# Patient Record
Sex: Male | Born: 1960
Health system: Southern US, Community
[De-identification: ages and names within clinical notes are randomized; demographics above are authoritative.]

## PROBLEM LIST (undated history)

## (undated) DIAGNOSIS — T7840XA Allergy, unspecified, initial encounter: Secondary | ICD-10-CM

## (undated) DIAGNOSIS — I4891 Unspecified atrial fibrillation: Secondary | ICD-10-CM

## (undated) DIAGNOSIS — E7800 Pure hypercholesterolemia, unspecified: Secondary | ICD-10-CM

## (undated) DIAGNOSIS — E079 Disorder of thyroid, unspecified: Secondary | ICD-10-CM

## (undated) DIAGNOSIS — J189 Pneumonia, unspecified organism: Secondary | ICD-10-CM

## (undated) DIAGNOSIS — F419 Anxiety disorder, unspecified: Secondary | ICD-10-CM

## (undated) DIAGNOSIS — I10 Essential (primary) hypertension: Secondary | ICD-10-CM

## (undated) DIAGNOSIS — F32A Depression, unspecified: Secondary | ICD-10-CM

## (undated) DIAGNOSIS — F329 Major depressive disorder, single episode, unspecified: Secondary | ICD-10-CM

## (undated) DIAGNOSIS — G510 Bell's palsy: Secondary | ICD-10-CM

## (undated) HISTORY — DX: Essential (primary) hypertension: I10

## (undated) HISTORY — DX: Bell's palsy: G51.0

## (undated) HISTORY — PX: VASECTOMY: SHX75

## (undated) HISTORY — DX: Anxiety disorder, unspecified: F41.9

## (undated) HISTORY — PX: CHOLECYSTECTOMY: SHX55

## (undated) HISTORY — DX: Pneumonia, unspecified organism: J18.9

## (undated) HISTORY — DX: Depression, unspecified: F32.A

## (undated) HISTORY — DX: Disorder of thyroid, unspecified: E07.9

## (undated) HISTORY — DX: Allergy, unspecified, initial encounter: T78.40XA

## (undated) HISTORY — PX: HERNIA REPAIR: SHX51

## (undated) HISTORY — DX: Major depressive disorder, single episode, unspecified: F32.9

## (undated) HISTORY — PX: KNEE SURGERY: SHX244

## (undated) HISTORY — DX: Pure hypercholesterolemia, unspecified: E78.00

---

## 1999-05-14 ENCOUNTER — Emergency Department (HOSPITAL_COMMUNITY): Admission: EM | Admit: 1999-05-14 | Discharge: 1999-05-14 | Payer: Self-pay | Admitting: Emergency Medicine

## 2003-08-01 ENCOUNTER — Encounter: Admission: RE | Admit: 2003-08-01 | Discharge: 2003-08-01 | Payer: Self-pay | Admitting: Otolaryngology

## 2005-12-07 LAB — HM COLONOSCOPY

## 2007-12-26 ENCOUNTER — Emergency Department (HOSPITAL_COMMUNITY): Admission: EM | Admit: 2007-12-26 | Discharge: 2007-12-27 | Payer: Self-pay | Admitting: Emergency Medicine

## 2008-01-01 ENCOUNTER — Inpatient Hospital Stay (HOSPITAL_COMMUNITY): Admission: EM | Admit: 2008-01-01 | Discharge: 2008-01-03 | Payer: Self-pay | Admitting: Emergency Medicine

## 2008-01-02 ENCOUNTER — Encounter (INDEPENDENT_AMBULATORY_CARE_PROVIDER_SITE_OTHER): Payer: Self-pay | Admitting: Surgery

## 2009-02-12 LAB — HM COLONOSCOPY

## 2010-08-21 ENCOUNTER — Emergency Department (HOSPITAL_COMMUNITY): Admission: EM | Admit: 2010-08-21 | Discharge: 2010-04-06 | Payer: Self-pay | Admitting: Emergency Medicine

## 2010-11-18 ENCOUNTER — Inpatient Hospital Stay (HOSPITAL_COMMUNITY)
Admission: EM | Admit: 2010-11-18 | Discharge: 2010-11-26 | DRG: 308 | Disposition: A | Discharge status: Home / Self Care | Payer: 59 | Attending: Family Medicine | Admitting: Family Medicine

## 2010-11-18 ENCOUNTER — Emergency Department (HOSPITAL_COMMUNITY): Payer: 59

## 2010-11-18 DIAGNOSIS — I4891 Unspecified atrial fibrillation: Principal | ICD-10-CM | POA: Diagnosis present

## 2010-11-18 DIAGNOSIS — F341 Dysthymic disorder: Secondary | ICD-10-CM | POA: Diagnosis present

## 2010-11-18 DIAGNOSIS — J209 Acute bronchitis, unspecified: Secondary | ICD-10-CM | POA: Diagnosis present

## 2010-11-18 DIAGNOSIS — E0590 Thyrotoxicosis, unspecified without thyrotoxic crisis or storm: Secondary | ICD-10-CM | POA: Diagnosis present

## 2010-11-18 DIAGNOSIS — E876 Hypokalemia: Secondary | ICD-10-CM | POA: Diagnosis present

## 2010-11-18 DIAGNOSIS — E785 Hyperlipidemia, unspecified: Secondary | ICD-10-CM | POA: Diagnosis present

## 2010-11-18 DIAGNOSIS — F411 Generalized anxiety disorder: Secondary | ICD-10-CM

## 2010-11-18 DIAGNOSIS — D72829 Elevated white blood cell count, unspecified: Secondary | ICD-10-CM | POA: Diagnosis present

## 2010-11-18 DIAGNOSIS — Z7982 Long term (current) use of aspirin: Secondary | ICD-10-CM

## 2010-11-18 DIAGNOSIS — I10 Essential (primary) hypertension: Secondary | ICD-10-CM | POA: Diagnosis present

## 2010-11-18 DIAGNOSIS — J189 Pneumonia, unspecified organism: Secondary | ICD-10-CM | POA: Diagnosis present

## 2010-11-18 DIAGNOSIS — F17200 Nicotine dependence, unspecified, uncomplicated: Secondary | ICD-10-CM | POA: Diagnosis present

## 2010-11-18 LAB — COMPREHENSIVE METABOLIC PANEL
ALT: 30 U/L (ref 0–53)
AST: 21 U/L (ref 0–37)
Albumin: 3.3 g/dL — ABNORMAL LOW (ref 3.5–5.2)
Alkaline Phosphatase: 99 U/L (ref 39–117)
BUN: 7 mg/dL (ref 6–23)
CO2: 25 mEq/L (ref 19–32)
Calcium: 8.7 mg/dL (ref 8.4–10.5)
Chloride: 106 mEq/L (ref 96–112)
Creatinine, Ser: 0.7 mg/dL (ref 0.4–1.5)
GFR calc Af Amer: >60 mL/min (ref >60)
GFR calc non Af Amer: >60 mL/min (ref >60)
Glucose, Bld: 106 mg/dL — ABNORMAL HIGH (ref 70–99)
Potassium: 3.8 mEq/L (ref 3.5–5.1)
Sodium: 137 mEq/L (ref 135–145)
Total Bilirubin: 0.9 mg/dL (ref 0.3–1.2)
Total Protein: 5.9 g/dL — ABNORMAL LOW (ref 6.0–8.3)

## 2010-11-18 LAB — DIFFERENTIAL
Basophils Absolute: 0 10*3/uL (ref 0.0–0.1)
Basophils Relative: 0 % (ref 0–1)
Eosinophils Absolute: 0.2 10*3/uL (ref 0.0–0.7)
Eosinophils Relative: 1 % (ref 0–5)
Lymphocytes Relative: 9 % — ABNORMAL LOW (ref 12–46)
Lymphs Abs: 1.9 10*3/uL (ref 0.7–4.0)
Monocytes Absolute: 2 10*3/uL — ABNORMAL HIGH (ref 0.1–1.0)
Monocytes Relative: 9 % (ref 3–12)
Neutro Abs: 17.4 10*3/uL — ABNORMAL HIGH (ref 1.7–7.7)
Neutrophils Relative %: 81 % — ABNORMAL HIGH (ref 43–77)

## 2010-11-18 LAB — CBC
HCT: 41.9 % (ref 39.0–52.0)
Hemoglobin: 14.3 g/dL (ref 13.0–17.0)
MCH: 30.3 pg (ref 26.0–34.0)
MCHC: 34.1 g/dL (ref 30.0–36.0)
MCV: 88.8 fL (ref 78.0–100.0)
Platelets: 252 10*3/uL (ref 150–400)
RBC: 4.72 MIL/uL (ref 4.22–5.81)
RDW: 13.2 % (ref 11.5–15.5)
WBC: 21.5 10*3/uL — ABNORMAL HIGH (ref 4.0–10.5)

## 2010-11-18 LAB — POCT CARDIAC MARKERS
CKMB, poc: <1 ng/mL — ABNORMAL LOW (ref 1.0–8.0)
Myoglobin, poc: 52.3 ng/mL (ref 12–200)
Troponin i, poc: <0.05 ng/mL (ref 0.00–0.09)

## 2010-11-18 LAB — LACTIC ACID, PLASMA: Lactic Acid, Venous: 1.5 mmol/L (ref 0.5–2.2)

## 2010-11-18 LAB — BRAIN NATRIURETIC PEPTIDE: Pro B Natriuretic peptide (BNP): 231 pg/mL — ABNORMAL HIGH (ref 0.0–100.0)

## 2010-11-18 LAB — D-DIMER, QUANTITATIVE: D-Dimer, Quant: <0.22 ug/mL-FEU (ref 0.00–0.48)

## 2010-11-18 LAB — APTT: aPTT: 30 seconds (ref 24–37)

## 2010-11-18 LAB — PROTIME-INR
INR: 1.08 (ref 0.00–1.49)
Prothrombin Time: 14.2 seconds (ref 11.6–15.2)

## 2010-11-18 LAB — TROPONIN I: Troponin I: 0.01 ng/mL (ref 0.00–0.06)

## 2010-11-18 LAB — MRSA PCR SCREENING: MRSA by PCR: NEGATIVE

## 2010-11-18 LAB — CK TOTAL AND CKMB (NOT AT ARMC)
CK, MB: 1 ng/mL (ref 0.3–4.0)
Relative Index: INVALID (ref 0.0–2.5)
Total CK: 54 U/L (ref 7–232)

## 2010-11-19 ENCOUNTER — Inpatient Hospital Stay (HOSPITAL_COMMUNITY): Payer: 59

## 2010-11-19 DIAGNOSIS — I517 Cardiomegaly: Secondary | ICD-10-CM

## 2010-11-19 LAB — LIPID PANEL
Cholesterol: 75 mg/dL (ref 0–200)
HDL: 34 mg/dL — ABNORMAL LOW (ref >39)
LDL Cholesterol: 28 mg/dL (ref 0–99)
Total CHOL/HDL Ratio: 2.2 RATIO
Triglycerides: 64 mg/dL (ref <150)
VLDL: 13 mg/dL (ref 0–40)

## 2010-11-19 LAB — COMPREHENSIVE METABOLIC PANEL
ALT: 26 U/L (ref 0–53)
AST: 22 U/L (ref 0–37)
Albumin: 2.9 g/dL — ABNORMAL LOW (ref 3.5–5.2)
Alkaline Phosphatase: 88 U/L (ref 39–117)
BUN: 6 mg/dL (ref 6–23)
CO2: 20 mEq/L (ref 19–32)
Calcium: 8 mg/dL — ABNORMAL LOW (ref 8.4–10.5)
Chloride: 106 mEq/L (ref 96–112)
Creatinine, Ser: 0.76 mg/dL (ref 0.4–1.5)
GFR calc Af Amer: >60 mL/min (ref >60)
GFR calc non Af Amer: >60 mL/min (ref >60)
Glucose, Bld: 94 mg/dL (ref 70–99)
Potassium: 3.5 mEq/L (ref 3.5–5.1)
Sodium: 138 mEq/L (ref 135–145)
Total Bilirubin: 0.8 mg/dL (ref 0.3–1.2)
Total Protein: 5.7 g/dL — ABNORMAL LOW (ref 6.0–8.3)

## 2010-11-19 LAB — HEMOGLOBIN A1C
Hgb A1c MFr Bld: 5.7 % — ABNORMAL HIGH (ref <5.7)
Mean Plasma Glucose: 117 mg/dL — ABNORMAL HIGH (ref <117)

## 2010-11-19 LAB — T3, FREE: T3, Free: 7.8 pg/mL — ABNORMAL HIGH (ref 2.3–4.2)

## 2010-11-19 LAB — CBC
HCT: 37 % — ABNORMAL LOW (ref 39.0–52.0)
Hemoglobin: 12.5 g/dL — ABNORMAL LOW (ref 13.0–17.0)
MCH: 30.3 pg (ref 26.0–34.0)
MCHC: 33.8 g/dL (ref 30.0–36.0)
MCV: 89.6 fL (ref 78.0–100.0)
Platelets: 194 10*3/uL (ref 150–400)
RBC: 4.13 MIL/uL — ABNORMAL LOW (ref 4.22–5.81)
RDW: 13.7 % (ref 11.5–15.5)
WBC: 14.1 10*3/uL — ABNORMAL HIGH (ref 4.0–10.5)

## 2010-11-19 LAB — TSH: TSH: <0.008 u[IU]/mL — ABNORMAL LOW (ref 0.350–4.500)

## 2010-11-19 LAB — T4, FREE: Free T4: 3 ng/dL — ABNORMAL HIGH (ref 0.80–1.80)

## 2010-11-20 LAB — COMPREHENSIVE METABOLIC PANEL
ALT: 31 U/L (ref 0–53)
AST: 25 U/L (ref 0–37)
Albumin: 2.8 g/dL — ABNORMAL LOW (ref 3.5–5.2)
Alkaline Phosphatase: 102 U/L (ref 39–117)
BUN: 8 mg/dL (ref 6–23)
CO2: 23 mEq/L (ref 19–32)
Calcium: 8.1 mg/dL — ABNORMAL LOW (ref 8.4–10.5)
Chloride: 102 mEq/L (ref 96–112)
Creatinine, Ser: 0.67 mg/dL (ref 0.4–1.5)
GFR calc Af Amer: >60 mL/min (ref >60)
GFR calc non Af Amer: >60 mL/min (ref >60)
Glucose, Bld: 114 mg/dL — ABNORMAL HIGH (ref 70–99)
Potassium: 3.7 mEq/L (ref 3.5–5.1)
Sodium: 133 mEq/L — ABNORMAL LOW (ref 135–145)
Total Bilirubin: 0.4 mg/dL (ref 0.3–1.2)
Total Protein: 5.4 g/dL — ABNORMAL LOW (ref 6.0–8.3)

## 2010-11-20 LAB — CBC
HCT: 37.7 % — ABNORMAL LOW (ref 39.0–52.0)
Hemoglobin: 12.6 g/dL — ABNORMAL LOW (ref 13.0–17.0)
MCH: 29.8 pg (ref 26.0–34.0)
MCHC: 33.4 g/dL (ref 30.0–36.0)
MCV: 89.1 fL (ref 78.0–100.0)
Platelets: 191 10*3/uL (ref 150–400)
RBC: 4.23 MIL/uL (ref 4.22–5.81)
RDW: 13.8 % (ref 11.5–15.5)
WBC: 13.9 10*3/uL — ABNORMAL HIGH (ref 4.0–10.5)

## 2010-11-20 LAB — THYROID ANTIBODIES
Thyroglobulin Ab: <20 U/mL (ref <40.0)
Thyroperoxidase Ab SerPl-aCnc: <10 IU/mL (ref <35.0)

## 2010-11-21 ENCOUNTER — Inpatient Hospital Stay (HOSPITAL_COMMUNITY): Payer: 59

## 2010-11-21 LAB — BASIC METABOLIC PANEL WITH GFR
BUN: 8 mg/dL (ref 6–23)
CO2: 24 meq/L (ref 19–32)
Calcium: 8.1 mg/dL — ABNORMAL LOW (ref 8.4–10.5)
Chloride: 105 meq/L (ref 96–112)
Creatinine, Ser: 0.62 mg/dL (ref 0.4–1.5)
GFR calc non Af Amer: >60 mL/min
Glucose, Bld: 123 mg/dL — ABNORMAL HIGH (ref 70–99)
Potassium: 3.3 meq/L — ABNORMAL LOW (ref 3.5–5.1)
Sodium: 137 meq/L (ref 135–145)

## 2010-11-21 LAB — CBC
HCT: 37.1 % — ABNORMAL LOW (ref 39.0–52.0)
Hemoglobin: 12.5 g/dL — ABNORMAL LOW (ref 13.0–17.0)
MCH: 29.8 pg (ref 26.0–34.0)
MCHC: 33.7 g/dL (ref 30.0–36.0)
MCV: 88.3 fL (ref 78.0–100.0)
Platelets: 200 10*3/uL (ref 150–400)
RBC: 4.2 MIL/uL — ABNORMAL LOW (ref 4.22–5.81)
RDW: 13.5 % (ref 11.5–15.5)
WBC: 13 10*3/uL — ABNORMAL HIGH (ref 4.0–10.5)

## 2010-11-22 ENCOUNTER — Inpatient Hospital Stay (HOSPITAL_COMMUNITY): Payer: 59

## 2010-11-22 LAB — COMPREHENSIVE METABOLIC PANEL
ALT: 38 U/L (ref 0–53)
AST: 31 U/L (ref 0–37)
Albumin: 2.9 g/dL — ABNORMAL LOW (ref 3.5–5.2)
Alkaline Phosphatase: 119 U/L — ABNORMAL HIGH (ref 39–117)
BUN: 8 mg/dL (ref 6–23)
CO2: 26 mEq/L (ref 19–32)
Calcium: 8.7 mg/dL (ref 8.4–10.5)
Chloride: 106 mEq/L (ref 96–112)
Creatinine, Ser: 0.63 mg/dL (ref 0.4–1.5)
GFR calc Af Amer: >60 mL/min (ref >60)
GFR calc non Af Amer: >60 mL/min (ref >60)
Glucose, Bld: 118 mg/dL — ABNORMAL HIGH (ref 70–99)
Potassium: 3.5 mEq/L (ref 3.5–5.1)
Sodium: 138 mEq/L (ref 135–145)
Total Bilirubin: 0.7 mg/dL (ref 0.3–1.2)
Total Protein: 5.9 g/dL — ABNORMAL LOW (ref 6.0–8.3)

## 2010-11-22 LAB — CBC
HCT: 37.6 % — ABNORMAL LOW (ref 39.0–52.0)
Hemoglobin: 12.7 g/dL — ABNORMAL LOW (ref 13.0–17.0)
MCH: 29.6 pg (ref 26.0–34.0)
MCHC: 33.8 g/dL (ref 30.0–36.0)
MCV: 87.6 fL (ref 78.0–100.0)
Platelets: 220 10*3/uL (ref 150–400)
RBC: 4.29 MIL/uL (ref 4.22–5.81)
RDW: 13.2 % (ref 11.5–15.5)
WBC: 13.3 10*3/uL — ABNORMAL HIGH (ref 4.0–10.5)

## 2010-11-22 MED ORDER — SODIUM IODIDE I 131 CAPSULE
17.8000 | Freq: Once | INTRAVENOUS | Status: AC | PRN
  Administered 2010-11-21: 17.8 via ORAL

## 2010-11-22 MED ORDER — SODIUM PERTECHNETATE TC 99M INJECTION
10.0000 | Freq: Once | INTRAVENOUS | Status: AC | PRN
  Administered 2010-11-22: 10.9 via INTRAVENOUS

## 2010-11-23 LAB — CBC
HCT: 36.6 % — ABNORMAL LOW (ref 39.0–52.0)
Hemoglobin: 12.3 g/dL — ABNORMAL LOW (ref 13.0–17.0)
MCH: 29.4 pg (ref 26.0–34.0)
MCHC: 33.6 g/dL (ref 30.0–36.0)
MCV: 87.6 fL (ref 78.0–100.0)
Platelets: 222 10*3/uL (ref 150–400)
RBC: 4.18 MIL/uL — ABNORMAL LOW (ref 4.22–5.81)
RDW: 13.2 % (ref 11.5–15.5)
WBC: 12.3 10*3/uL — ABNORMAL HIGH (ref 4.0–10.5)

## 2010-11-23 LAB — BASIC METABOLIC PANEL
BUN: 7 mg/dL (ref 6–23)
CO2: 26 mEq/L (ref 19–32)
Calcium: 8.5 mg/dL (ref 8.4–10.5)
Chloride: 104 mEq/L (ref 96–112)
Creatinine, Ser: 0.57 mg/dL (ref 0.4–1.5)
GFR calc Af Amer: >60 mL/min (ref >60)
GFR calc non Af Amer: >60 mL/min (ref >60)
Glucose, Bld: 127 mg/dL — ABNORMAL HIGH (ref 70–99)
Potassium: 3 mEq/L — ABNORMAL LOW (ref 3.5–5.1)
Sodium: 139 mEq/L (ref 135–145)

## 2010-11-24 LAB — CULTURE, BLOOD (ROUTINE X 2)
Culture  Setup Time: 201203062211
Culture  Setup Time: 201203062211
Culture: NO GROWTH
Culture: NO GROWTH

## 2010-11-24 LAB — BASIC METABOLIC PANEL
BUN: 6 mg/dL (ref 6–23)
CO2: 26 mEq/L (ref 19–32)
Calcium: 8.7 mg/dL (ref 8.4–10.5)
Chloride: 105 mEq/L (ref 96–112)
Creatinine, Ser: 0.61 mg/dL (ref 0.4–1.5)
GFR calc Af Amer: >60 mL/min (ref >60)
GFR calc non Af Amer: >60 mL/min (ref >60)
Glucose, Bld: 114 mg/dL — ABNORMAL HIGH (ref 70–99)
Potassium: 3.7 mEq/L (ref 3.5–5.1)
Sodium: 137 mEq/L (ref 135–145)

## 2010-11-24 LAB — DIGOXIN LEVEL: Digoxin Level: <0.2 ng/mL — ABNORMAL LOW (ref 0.8–2.0)

## 2010-11-24 LAB — CBC
HCT: 35.9 % — ABNORMAL LOW (ref 39.0–52.0)
Hemoglobin: 12 g/dL — ABNORMAL LOW (ref 13.0–17.0)
MCH: 29.2 pg (ref 26.0–34.0)
MCHC: 33.4 g/dL (ref 30.0–36.0)
MCV: 87.3 fL (ref 78.0–100.0)
Platelets: 232 10*3/uL (ref 150–400)
RBC: 4.11 MIL/uL — ABNORMAL LOW (ref 4.22–5.81)
RDW: 13.1 % (ref 11.5–15.5)
WBC: 12.8 10*3/uL — ABNORMAL HIGH (ref 4.0–10.5)

## 2010-11-24 NOTE — H&P (Signed)
NAME:  Jesse Suarez, Jesse Suarez NO.:  [id number]  MEDICAL RECORD NO.:  [id number]           PATIENT TYPE:  E  LOCATION:  [facility]                         FACILITY:  [facility]  PHYSICIAN:  Santiago Bumpers. Hensel, M.D.DATE OF BIRTH:  1961/03/17  DATE OF ADMISSION:  11/18/2010 DATE OF DISCHARGE:                             HISTORY & PHYSICAL   PRIMARY CARE PROVIDER:  Brett Canales A. Cleta Alberts, MD, at [facility].  CHIEF COMPLAINT:  Cough x10 days.  HISTORY OF PRESENT ILLNESS:  This is a 50 year old male with a past medical history of hypertension, hyperlipidemia, obesity and smoker who presented initially to [facility] for a cough that he had x10 days that has been purulent yellow discharge, seemed to be worsening, positively some chills, no fever.  Denies any type of nausea, vomiting, diarrhea, constipation, chest pain and maybe some shortness of breath though that started and that is the reason why the patient presented to his primary care provider.  At that time then they got a chest x-ray that did not show any type of pneumonia, at that time but did get an EKG that was positive for a new onset atrial fibrillation.  He was transferred here by EMS to the emergency room.  While in the emergency room, the patient was put on diltiazem drip, brought his heart rate from 190-150s.  At this time the patient is asymptomatic otherwise.  PAST MEDICAL HISTORY: 1. Positive for hypertension. 2. Depression 3. Hyperlipidemia. 4. Obesity.  ALLERGIES:  He is allergic to CODEINE and SEPTRA.  PAST SURGICAL HISTORY:  He has had an inguinal hernia repair and his gallbladder removed.  MEDICATIONS: 1. Xanax 0.5 mg p.o. t.i.d. p.r.n. 2. Lisinopril and hydrochlorothiazide 10/12.5 one pill daily. 3. Niaspan 1000 mg b.i.d. 4. Prozac 60 mg daily 5. Zocor 80 mg nightly. 6. Aspirin 81 mg daily.  SOCIAL HISTORY:  He lives with his wife and kid.  He is a smoker.  He is smoking approximately  one-half pack a day.  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  Denies any alcohol or drugs.  FAMILY HISTORY:  Positive for mother having irregular heart rate and died of an MI at the age of 83.  PERTINENT LABORATORY FINDINGS:  The patient had cardiac enzymes negative x1.  The patient's BMET; sodium 137, potassium 3.8, chloride 106, bicarb 25, BUN 7, creatinine 0.7, glucose 106.  INR is 1.08.  The patient's CBC showed an elevated white blood cell count of 21.5, hemoglobin of 14.3, and his platelets of 252.  The patient had a chest x-ray that only showed some minimal atelectasis.  PHYSICAL EXAMINATION:  VITAL SIGNS:  The patient's temperature 98.7, pulse of 145 irregular, respirations 30, blood pressure 98/58, and the patient is 96% on 2 L. GENERAL:  The patient is in no apparent distress.  He is mildly tachypneic. HEENT:  Eyes, pupils are equal, round, and reactive to light and accommodation.  Extraocular movements are intact.  The patient does have kind of glossy look to his eyes, possibly secondary to the fever. NECK:  Supple.  Mild lymphadenopathy. HEART:  Irregular rhythm.  No  murmur heard. LUNGS:  Positive rhonchi and rales heard in the left upper and middle lobes, otherwise good aeration. ABDOMEN:  Bowel sounds positive, obese, nontender. EXTREMITIES:  No edema, 2+ pulses. NEURO:  Cranial nerves II-XII intact and neurovascularly intact in all extremities.  ASSESSMENT AND PLAN:  This is a 50 year old male with past medical history of obesity, hypertension and smoking and hyperlipidemia who presents with cough, elevated white blood cells and as well as a new onset atrial fibrillation. 1. New onset atrial fibrillation.  The patient has never had this in     the past.  The patient do not know when this has occurred and     likely will not treat this as an acute AFib exacerbation.  The     patient is responding somewhat to his diltiazem drip and he is down     from 190 heart  rate to 145.  The patient's blood pressure is     getting a little bit low from the 110s to the 90s and even in 88/64     systolic at one point.  The patient is asymptomatic.  We will     continue the drip for now.  We will place in a step-down unit.     Other causes that could cause this new AFib other than include     infectious etiology, the patient's past medical history, thyroid as     well as pulmonary embolism.  We will get a TSH and a D-dimer at     this time, but also there is a very low likelihood of being the     cause that we will check.  We will risk stratify with a fasting     lipid panel as well as A1c as well and we will monitor overnight. 2. Elevated white blood cell count.  The patient does appear to be     somewhat ill.  We do have focal findings on physical exam with the     lung exam and the patient has had a cough for 10 days.  We would     not be surprised if the patient did have pneumonia and we will     treat as such.  We will treat for community-acquired pneumonia.     The patient does not have any other risk factors for that that     would make him more of a hospital-acquired pneumonia itself.  We     will start ceftriaxone and azithromycin.  We will get blood     cultures at this time, lactic acid as well as procalcitonin due to     the patient being hypotensive and tachycardic likely secondary to     the AFib and the medications, but we will make sure the patient is     not septic of any nature. 3. Hypertension.  We will hold the patient's blood pressure     medications at this time.  The patient is responding to the     diltiazem depending on how he responds to the diltiazem that might     be enough to take care of his AFib as well as blood pressure in the     long run.  We will evaluate later this admission. 4. Hyperlipidemia.  We will get a fasting lipid panel, continue     simvastatin. 5. Depression, anxiety.  Continue the Prozac as well as the Xanax 6.  Fluids, electrolytes, nutrition, gastrointestinal.  We will keep  the patient n.p.o. for now and then we will increase to heart     healthy diet.  We will give 1 liter bolus of fluid.  At this time     the patient does appear to be mildly dry and also we will help     patient's blood pressure and we will continue IV fluids, normal     saline at 150     mL an hour. 7. Prophylactics.  We will give heparin and proton pump inhibitor. 8. Disposition.  This will be pending clinical improvement.     Antoine Primas, DO   ______________________________ Santiago Bumpers Leveda Anna, M.D.    ZS/MEDQ  D:  11/18/2010  T:  11/19/2010  Job:  [id number]  Electronically Signed by Antoine Primas  on 11/21/2010 06:05:43 PM Electronically Signed by Doralee Albino M.D. on 11/24/2010 09:10:47 AM

## 2010-11-25 LAB — BASIC METABOLIC PANEL
BUN: 7 mg/dL (ref 6–23)
CO2: 27 mEq/L (ref 19–32)
Calcium: 8.9 mg/dL (ref 8.4–10.5)
Chloride: 103 mEq/L (ref 96–112)
Creatinine, Ser: 0.71 mg/dL (ref 0.4–1.5)
GFR calc Af Amer: >60 mL/min (ref >60)
GFR calc non Af Amer: >60 mL/min (ref >60)
Glucose, Bld: 134 mg/dL — ABNORMAL HIGH (ref 70–99)
Potassium: 3.9 mEq/L (ref 3.5–5.1)
Sodium: 138 mEq/L (ref 135–145)

## 2010-11-25 LAB — CBC
HCT: 38.3 % — ABNORMAL LOW (ref 39.0–52.0)
Hemoglobin: 12.6 g/dL — ABNORMAL LOW (ref 13.0–17.0)
MCH: 28.7 pg (ref 26.0–34.0)
MCHC: 32.9 g/dL (ref 30.0–36.0)
MCV: 87.2 fL (ref 78.0–100.0)
Platelets: 284 10*3/uL (ref 150–400)
RBC: 4.39 MIL/uL (ref 4.22–5.81)
RDW: 13.1 % (ref 11.5–15.5)
WBC: 15.6 10*3/uL — ABNORMAL HIGH (ref 4.0–10.5)

## 2010-11-26 LAB — BASIC METABOLIC PANEL
BUN: 8 mg/dL (ref 6–23)
CO2: 26 mEq/L (ref 19–32)
Calcium: 8.9 mg/dL (ref 8.4–10.5)
Chloride: 102 mEq/L (ref 96–112)
Creatinine, Ser: 0.69 mg/dL (ref 0.4–1.5)
GFR calc Af Amer: >60 mL/min (ref >60)
GFR calc non Af Amer: >60 mL/min (ref >60)
Glucose, Bld: 110 mg/dL — ABNORMAL HIGH (ref 70–99)
Potassium: 3.6 mEq/L (ref 3.5–5.1)
Sodium: 137 mEq/L (ref 135–145)

## 2010-11-26 LAB — CBC
HCT: 37.5 % — ABNORMAL LOW (ref 39.0–52.0)
Hemoglobin: 12.3 g/dL — ABNORMAL LOW (ref 13.0–17.0)
MCH: 28.5 pg (ref 26.0–34.0)
MCHC: 32.8 g/dL (ref 30.0–36.0)
MCV: 86.8 fL (ref 78.0–100.0)
Platelets: 302 10*3/uL (ref 150–400)
RBC: 4.32 MIL/uL (ref 4.22–5.81)
RDW: 13 % (ref 11.5–15.5)
WBC: 16.6 10*3/uL — ABNORMAL HIGH (ref 4.0–10.5)

## 2010-11-26 NOTE — Consult Note (Signed)
NAME:  Jesse Suarez, Jesse Suarez NO.:  [id number]  MEDICAL RECORD NO.:  [id number]           PATIENT TYPE:  E  LOCATION:  [facility]                         FACILITY:  [facility]  PHYSICIAN:  Doylene Canning. Ladona Ridgel, MD    DATE OF BIRTH:  1961/06/30  DATE OF CONSULTATION:  11/18/2010 DATE OF DISCHARGE:                                CONSULTATION   The patient is new to Cardiology.  PRIMARY CARE PHYSICIAN:  Brett Canales A. Daub, MD  REASON FOR CONSULTATION:  Atrial fibrillation with rapid ventricular response.  HISTORY OF PRESENT ILLNESS:  Jesse Suarez is a 50 year old Caucasian gentleman with no cardiac history but history significant for hypertension, hyperlipidemia, ongoing tobacco abuse (15 pack years, 1/2 ppd), and depression, who presents with atrial fibrillation with RVR with rates up to 190 bpm in the setting of likely acute bronchitis versus community-acquired pneumonia and hypertension.  The patient was in his usual state of health until approximately 10 days ago when he started noticing cough mildly productive of clear phlegm. He had some fevers and chills and has had some mild orthopnea over the last 3 days.  As his symptoms were continuing and he was feeling more and more fatigued and just generally unwell, he finally presented to his primary care physician who noted on initial EKG that the patient was in atrial fibrillation with RVR.  He was subsequently stent to [facility] for further evaluation.  In the emergency department, the patient was noted to be in atrial fibrillation RVR with rates up to 190 bpm.  He has been hypotensive with systolic blood pressure nadir of 85, ranging from 85-114 systolic.  He was given diltiazem bolus and then currently on 5 mg/hour of diltiazem with rates still anywhere from the 120s to 170s.  Blood pressure is still soft but generally in the 90s.  Chest x-ray showed minimal bibasilar atelectasis.  EKG does not show acute changes but rhythm  and rate as above.  WBC of 21.5 with 81% neutrophils, otherwise CBC unremarkable, CMET unremarkable.  D-dimer is negative with BNP 231.  TSH pending.  Point-of-care markers negative x1.  Currently, the patient is not complaining of any shortness of breath but is just feeling a little anxious about his fast heart rate and low blood pressure.  PAST MEDICAL HISTORY: 1. Hypertension. 2. Hyperlipidemia. 3. Ongoing tobacco abuse (1/2 ppd, 15-pack-year history). 4. History of depression. 5. S/P cholecystitis.     a.     Laparoscopic cholecystectomy, January 02, 2008. 6. History of inguinal hernia repair.  SOCIAL HISTORY:  The patient has the above-noted history of tobacco abuse (15 pack years, 1/2 ppd).  No EtOH.  No illicit drug use.  No herbal meds.  Regular diet.  No regular exercise.  FAMILY HISTORY:  Mother had "irregular heartbeat."  Otherwise, no known cardiac history in first-degree relatives.  REVIEW OF SYSTEMS:  Please see HPI.  All other systems were reviewed and were negative.  Of note, the patient has had no palpitations or presyncope.  No lower extremity edema.  No PND, no diaphoresis.  No nausea/vomiting, no change in bowel or bladder habits, no  dark stools or known bleeding.  PHYSICAL EXAMINATION:  VITAL SIGNS:  Temp 98.7 degrees Fahrenheit with BP ranging from 85 to 140 over 44 to 79, pulse ranging from 126 to 190, respiration rate 18 to mid 20s, O2 saturation 98% on 2 liters of nasal cannula. GENERAL:  The patient is alert and oriented x3, in mild distress including mild respiratory distress with full sentences.  He is mildly tachypneic. HEENT:  Head is normocephalic and atraumatic.  Pupils equal, round, and reactive to light.  Extraocular muscles are intact.  Nares are patent without discharge.  Oropharynx is without erythema or exudates. NECK:  Supple without lymphadenopathy.  No thyromegaly.  No JVD (difficult to assess given body habitus). HEART:  Rate is  irregularly irregular with audible S1 and S2 and tachy, but no clicks, rubs, murmurs, or gallops.  Pulses are 2+ and equal in both upper and lower extremities bilaterally. LUNGS:  Clear to auscultation bilaterally. SKIN:  No rashes, lesions, or petechiae. ABDOMEN:  Soft, nontender, nondistended.  Normal abdominal bowel sounds. No rebound or guarding.  No hepatosplenomegaly. EXTREMITIES:  Without clubbing, cyanosis, or edema. MUSCULOSKELETAL:  No joint deformity or effusions.  No spinal or CVA tenderness. NEUROLOGIC:  Cranial nerves II-XII grossly intact.  Strength 5/5 in all extremities and axial groups.  Normal sensation throughout.  Normal cerebellar function.  RADIOLOGY: 1. Minimal bibasilar atelectasis. 2. EKG, rhythm atrial fibrillation at a rate of 177 bpm.  No ischemic     changes.  No significant Q-waves.  Normal axis.  No evidence of     hypertrophy, QRS 70, QTc of 467.  No prior tracing for comparison.  LABS:  WBC of 21.5 with neutrophils of 81%, HGB 14.3, HCT 47.9, PLT count 252.  Sodium 137, potassium 3.8, chloride 106, bicarb 25, BUN 7, creatinine 0.7.  Liver function tests within normal limits.  D-dimerless than 0.22.  BNP 231.  Point-of-care markers are negative.  ProTime 14.2, INR 1.08.  First full set of enzymes also negative.  Lactic acid only 1.5.  ASSESSMENT AND PLAN:  Jesse Suarez was initially seen by Lenard Galloway, [facility], and currently seen and thoroughly assessed by attending cardiologist/electrophysiologist, Dr. Lewayne Bunting.  Jesse Suarez is a 50 year old Caucasian gentleman with no known cardiac history but history significant for hypertension, hyperlipidemia, ongoing tobacco abuse, depression, who now presents with new-onset atrial fibrillation with rapid ventricular response (rate up to 190 bpm) in the setting of acute bronchitis versus community-acquired pneumonia and hypertension with as of yet insufficient evidence of sepsis.  Atrial fibrillation with  rapid ventricular response, new onset - given the patient's hypertension, we will attempt rate control with IV digoxin at 0.25 mg now, repeat in 2 hours, again in the morning, and then we will reassess.  I will follow up with TSH and 2D echocardiogram.  As the patient's CHADS VASc score, as of now, is 1, sufficient to decrease thromboembolic risk with full-strength aspirin only.  Can reassess in the morning with further objective evidence.  Thanks for the consult.  We will continue to follow.     Jarrett Ables, PAC   ______________________________ Doylene Canning. Ladona Ridgel, MD    MS/MEDQ  D:  11/18/2010  T:  11/19/2010  Job:  [id number]  Electronically Signed by Jarrett Ables PAC on 11/20/2010 01:17:24 PM Electronically Signed by Lewayne Bunting MD on 11/26/2010 04:41:25 PM

## 2010-11-29 LAB — URINE MICROSCOPIC-ADD ON

## 2010-11-29 LAB — URINALYSIS, ROUTINE W REFLEX MICROSCOPIC
Bilirubin Urine: NEGATIVE
Glucose, UA: NEGATIVE mg/dL
Ketones, ur: NEGATIVE mg/dL
Leukocytes, UA: NEGATIVE
Nitrite: NEGATIVE
Protein, ur: NEGATIVE mg/dL
Specific Gravity, Urine: 1.023 (ref 1.005–1.030)
Urobilinogen, UA: 0.2 mg/dL (ref 0.0–1.0)
pH: 6 (ref 5.0–8.0)

## 2010-12-04 LAB — THYROTROPIN RECEPTOR AUTOABS: Thyrotropin Receptor Ab: 15 %Inhibition (ref <17)

## 2010-12-08 ENCOUNTER — Emergency Department (HOSPITAL_COMMUNITY)
Admission: EM | Admit: 2010-12-08 | Discharge: 2010-12-08 | Disposition: A | Discharge status: Home / Self Care | Payer: 59 | Attending: Emergency Medicine | Admitting: Emergency Medicine

## 2010-12-08 ENCOUNTER — Emergency Department (HOSPITAL_COMMUNITY): Payer: 59

## 2010-12-08 DIAGNOSIS — F329 Major depressive disorder, single episode, unspecified: Secondary | ICD-10-CM | POA: Insufficient documentation

## 2010-12-08 DIAGNOSIS — E7800 Pure hypercholesterolemia, unspecified: Secondary | ICD-10-CM | POA: Insufficient documentation

## 2010-12-08 DIAGNOSIS — R0902 Hypoxemia: Secondary | ICD-10-CM | POA: Insufficient documentation

## 2010-12-08 DIAGNOSIS — R5383 Other fatigue: Secondary | ICD-10-CM | POA: Insufficient documentation

## 2010-12-08 DIAGNOSIS — R5381 Other malaise: Secondary | ICD-10-CM | POA: Insufficient documentation

## 2010-12-08 DIAGNOSIS — E0590 Thyrotoxicosis, unspecified without thyrotoxic crisis or storm: Secondary | ICD-10-CM | POA: Insufficient documentation

## 2010-12-08 DIAGNOSIS — F3289 Other specified depressive episodes: Secondary | ICD-10-CM | POA: Insufficient documentation

## 2010-12-08 DIAGNOSIS — I4891 Unspecified atrial fibrillation: Secondary | ICD-10-CM | POA: Insufficient documentation

## 2010-12-08 DIAGNOSIS — I10 Essential (primary) hypertension: Secondary | ICD-10-CM | POA: Insufficient documentation

## 2010-12-08 DIAGNOSIS — Z79899 Other long term (current) drug therapy: Secondary | ICD-10-CM | POA: Insufficient documentation

## 2010-12-08 DIAGNOSIS — Z7982 Long term (current) use of aspirin: Secondary | ICD-10-CM | POA: Insufficient documentation

## 2010-12-08 DIAGNOSIS — R002 Palpitations: Secondary | ICD-10-CM | POA: Insufficient documentation

## 2010-12-08 LAB — POCT I-STAT, CHEM 8
BUN: 3 mg/dL — ABNORMAL LOW (ref 6–23)
Calcium, Ion: 1.08 mmol/L — ABNORMAL LOW (ref 1.12–1.32)
Chloride: 101 mEq/L (ref 96–112)
Creatinine, Ser: 0.7 mg/dL (ref 0.4–1.5)
Glucose, Bld: 127 mg/dL — ABNORMAL HIGH (ref 70–99)
HCT: 42 % (ref 39.0–52.0)
Hemoglobin: 14.3 g/dL (ref 13.0–17.0)
Potassium: 4 mEq/L (ref 3.5–5.1)
Sodium: 140 mEq/L (ref 135–145)
TCO2: 26 mmol/L (ref 0–100)

## 2010-12-08 LAB — CBC
HCT: 41.1 % (ref 39.0–52.0)
Hemoglobin: 13.6 g/dL (ref 13.0–17.0)
MCH: 28.7 pg (ref 26.0–34.0)
MCHC: 33.1 g/dL (ref 30.0–36.0)
MCV: 86.7 fL (ref 78.0–100.0)
Platelets: 342 10*3/uL (ref 150–400)
RBC: 4.74 MIL/uL (ref 4.22–5.81)
RDW: 13.7 % (ref 11.5–15.5)
WBC: 12.9 10*3/uL — ABNORMAL HIGH (ref 4.0–10.5)

## 2010-12-08 LAB — DIFFERENTIAL
Basophils Absolute: 0 10*3/uL (ref 0.0–0.1)
Basophils Relative: 0 % (ref 0–1)
Eosinophils Absolute: 0.3 10*3/uL (ref 0.0–0.7)
Eosinophils Relative: 2 % (ref 0–5)
Lymphocytes Relative: 21 % (ref 12–46)
Lymphs Abs: 2.7 10*3/uL (ref 0.7–4.0)
Monocytes Absolute: 1.6 10*3/uL — ABNORMAL HIGH (ref 0.1–1.0)
Monocytes Relative: 13 % — ABNORMAL HIGH (ref 3–12)
Neutro Abs: 8.3 10*3/uL — ABNORMAL HIGH (ref 1.7–7.7)
Neutrophils Relative %: 65 % (ref 43–77)

## 2010-12-08 LAB — BRAIN NATRIURETIC PEPTIDE: Pro B Natriuretic peptide (BNP): 273 pg/mL — ABNORMAL HIGH (ref 0.0–100.0)

## 2010-12-10 ENCOUNTER — Emergency Department (HOSPITAL_COMMUNITY): Payer: 59

## 2010-12-10 ENCOUNTER — Inpatient Hospital Stay (HOSPITAL_COMMUNITY)
Admission: EM | Admit: 2010-12-10 | Discharge: 2010-12-18 | DRG: 309 | Disposition: A | Discharge status: Home / Self Care | Payer: 59 | Attending: Family Medicine | Admitting: Family Medicine

## 2010-12-10 DIAGNOSIS — F17200 Nicotine dependence, unspecified, uncomplicated: Secondary | ICD-10-CM | POA: Diagnosis present

## 2010-12-10 DIAGNOSIS — J90 Pleural effusion, not elsewhere classified: Secondary | ICD-10-CM | POA: Diagnosis present

## 2010-12-10 DIAGNOSIS — Z7901 Long term (current) use of anticoagulants: Secondary | ICD-10-CM

## 2010-12-10 DIAGNOSIS — I10 Essential (primary) hypertension: Secondary | ICD-10-CM | POA: Diagnosis present

## 2010-12-10 DIAGNOSIS — F411 Generalized anxiety disorder: Secondary | ICD-10-CM | POA: Diagnosis present

## 2010-12-10 DIAGNOSIS — F329 Major depressive disorder, single episode, unspecified: Secondary | ICD-10-CM | POA: Diagnosis present

## 2010-12-10 DIAGNOSIS — I509 Heart failure, unspecified: Secondary | ICD-10-CM | POA: Diagnosis present

## 2010-12-10 DIAGNOSIS — I4891 Unspecified atrial fibrillation: Secondary | ICD-10-CM

## 2010-12-10 DIAGNOSIS — I5030 Unspecified diastolic (congestive) heart failure: Secondary | ICD-10-CM | POA: Diagnosis present

## 2010-12-10 DIAGNOSIS — Z7982 Long term (current) use of aspirin: Secondary | ICD-10-CM

## 2010-12-10 DIAGNOSIS — E785 Hyperlipidemia, unspecified: Secondary | ICD-10-CM | POA: Diagnosis present

## 2010-12-10 DIAGNOSIS — F3289 Other specified depressive episodes: Secondary | ICD-10-CM | POA: Diagnosis present

## 2010-12-10 DIAGNOSIS — E0500 Thyrotoxicosis with diffuse goiter without thyrotoxic crisis or storm: Secondary | ICD-10-CM | POA: Diagnosis present

## 2010-12-10 DIAGNOSIS — E669 Obesity, unspecified: Secondary | ICD-10-CM | POA: Diagnosis present

## 2010-12-10 DIAGNOSIS — E0590 Thyrotoxicosis, unspecified without thyrotoxic crisis or storm: Secondary | ICD-10-CM

## 2010-12-10 DIAGNOSIS — I959 Hypotension, unspecified: Secondary | ICD-10-CM | POA: Diagnosis not present

## 2010-12-10 DIAGNOSIS — IMO0002 Reserved for concepts with insufficient information to code with codable children: Secondary | ICD-10-CM | POA: Diagnosis present

## 2010-12-10 DIAGNOSIS — T465X5A Adverse effect of other antihypertensive drugs, initial encounter: Secondary | ICD-10-CM | POA: Diagnosis not present

## 2010-12-10 DIAGNOSIS — E7800 Pure hypercholesterolemia, unspecified: Secondary | ICD-10-CM | POA: Diagnosis present

## 2010-12-10 LAB — CK TOTAL AND CKMB (NOT AT ARMC)
CK, MB: 0.6 ng/mL (ref 0.3–4.0)
Relative Index: INVALID (ref 0.0–2.5)
Total CK: 19 U/L (ref 7–232)

## 2010-12-10 LAB — APTT: aPTT: 40 seconds — ABNORMAL HIGH (ref 24–37)

## 2010-12-10 LAB — POCT I-STAT, CHEM 8
BUN: 13 mg/dL (ref 6–23)
Calcium, Ion: 1.07 mmol/L — ABNORMAL LOW (ref 1.12–1.32)
Chloride: 104 mEq/L (ref 96–112)
Creatinine, Ser: 1 mg/dL (ref 0.4–1.5)
Glucose, Bld: 124 mg/dL — ABNORMAL HIGH (ref 70–99)
HCT: 43 % (ref 39.0–52.0)
Hemoglobin: 14.6 g/dL (ref 13.0–17.0)
Potassium: 3.9 mEq/L (ref 3.5–5.1)
Sodium: 139 mEq/L (ref 135–145)
TCO2: 24 mmol/L (ref 0–100)

## 2010-12-10 LAB — CBC
HCT: 41.8 % (ref 39.0–52.0)
Hemoglobin: 13.8 g/dL (ref 13.0–17.0)
MCH: 28.8 pg (ref 26.0–34.0)
MCHC: 33 g/dL (ref 30.0–36.0)
MCV: 87.3 fL (ref 78.0–100.0)
Platelets: 357 10*3/uL (ref 150–400)
RBC: 4.79 MIL/uL (ref 4.22–5.81)
RDW: 14.2 % (ref 11.5–15.5)
WBC: 19.6 10*3/uL — ABNORMAL HIGH (ref 4.0–10.5)

## 2010-12-10 LAB — BRAIN NATRIURETIC PEPTIDE: Pro B Natriuretic peptide (BNP): 76 pg/mL (ref 0.0–100.0)

## 2010-12-10 LAB — COMPREHENSIVE METABOLIC PANEL
ALT: 37 U/L (ref 0–53)
AST: 36 U/L (ref 0–37)
Albumin: 3.1 g/dL — ABNORMAL LOW (ref 3.5–5.2)
Alkaline Phosphatase: 184 U/L — ABNORMAL HIGH (ref 39–117)
BUN: 11 mg/dL (ref 6–23)
CO2: 26 mEq/L (ref 19–32)
Calcium: 8.9 mg/dL (ref 8.4–10.5)
Chloride: 105 mEq/L (ref 96–112)
Creatinine, Ser: 0.95 mg/dL (ref 0.4–1.5)
GFR calc Af Amer: >60 mL/min (ref >60)
GFR calc non Af Amer: >60 mL/min (ref >60)
Glucose, Bld: 99 mg/dL (ref 70–99)
Potassium: 4.2 mEq/L (ref 3.5–5.1)
Sodium: 139 mEq/L (ref 135–145)
Total Bilirubin: 1 mg/dL (ref 0.3–1.2)
Total Protein: 6.7 g/dL (ref 6.0–8.3)

## 2010-12-10 LAB — POCT CARDIAC MARKERS
CKMB, poc: <1 ng/mL — ABNORMAL LOW (ref 1.0–8.0)
Myoglobin, poc: 57.8 ng/mL (ref 12–200)
Troponin i, poc: <0.05 ng/mL (ref 0.00–0.09)

## 2010-12-10 LAB — PROTIME-INR
INR: 2.32 — ABNORMAL HIGH (ref 0.00–1.49)
Prothrombin Time: 25.6 seconds — ABNORMAL HIGH (ref 11.6–15.2)

## 2010-12-10 LAB — TROPONIN I: Troponin I: 0.01 ng/mL (ref 0.00–0.06)

## 2010-12-10 LAB — MAGNESIUM: Magnesium: 1.9 mg/dL (ref 1.5–2.5)

## 2010-12-11 ENCOUNTER — Inpatient Hospital Stay (HOSPITAL_COMMUNITY): Payer: 59

## 2010-12-11 LAB — T4, FREE: Free T4: 1.11 ng/dL (ref 0.80–1.80)

## 2010-12-11 LAB — CBC
HCT: 39.6 % (ref 39.0–52.0)
Hemoglobin: 12.8 g/dL — ABNORMAL LOW (ref 13.0–17.0)
MCH: 28.1 pg (ref 26.0–34.0)
MCHC: 32.3 g/dL (ref 30.0–36.0)
MCV: 87 fL (ref 78.0–100.0)
Platelets: 323 10*3/uL (ref 150–400)
RBC: 4.55 MIL/uL (ref 4.22–5.81)
RDW: 14.3 % (ref 11.5–15.5)
WBC: 17.5 10*3/uL — ABNORMAL HIGH (ref 4.0–10.5)

## 2010-12-11 LAB — BASIC METABOLIC PANEL
BUN: 9 mg/dL (ref 6–23)
CO2: 27 mEq/L (ref 19–32)
Calcium: 9.1 mg/dL (ref 8.4–10.5)
Chloride: 99 mEq/L (ref 96–112)
Creatinine, Ser: 0.94 mg/dL (ref 0.4–1.5)
GFR calc Af Amer: >60 mL/min (ref >60)
GFR calc non Af Amer: >60 mL/min (ref >60)
Glucose, Bld: 83 mg/dL (ref 70–99)
Potassium: 3.6 mEq/L (ref 3.5–5.1)
Sodium: 141 mEq/L (ref 135–145)

## 2010-12-11 LAB — T3, FREE: T3, Free: 3.7 pg/mL (ref 2.3–4.2)

## 2010-12-11 LAB — CARDIAC PANEL(CRET KIN+CKTOT+MB+TROPI)
CK, MB: 0.7 ng/mL (ref 0.3–4.0)
Relative Index: INVALID (ref 0.0–2.5)
Total CK: 18 U/L (ref 7–232)
Troponin I: 0.02 ng/mL (ref 0.00–0.06)

## 2010-12-11 LAB — MRSA PCR SCREENING: MRSA by PCR: NEGATIVE

## 2010-12-12 ENCOUNTER — Inpatient Hospital Stay (HOSPITAL_COMMUNITY): Payer: 59

## 2010-12-12 DIAGNOSIS — R0602 Shortness of breath: Secondary | ICD-10-CM

## 2010-12-12 DIAGNOSIS — I4891 Unspecified atrial fibrillation: Secondary | ICD-10-CM

## 2010-12-12 LAB — CBC
HCT: 39.8 % (ref 39.0–52.0)
Hemoglobin: 13.1 g/dL (ref 13.0–17.0)
MCH: 28.3 pg (ref 26.0–34.0)
MCHC: 32.9 g/dL (ref 30.0–36.0)
MCV: 86 fL (ref 78.0–100.0)
Platelets: 347 10*3/uL (ref 150–400)
RBC: 4.63 MIL/uL (ref 4.22–5.81)
RDW: 14.2 % (ref 11.5–15.5)
WBC: 15.8 10*3/uL — ABNORMAL HIGH (ref 4.0–10.5)

## 2010-12-12 LAB — BASIC METABOLIC PANEL
BUN: 5 mg/dL — ABNORMAL LOW (ref 6–23)
BUN: 5 mg/dL — ABNORMAL LOW (ref 6–23)
CO2: 32 mEq/L (ref 19–32)
CO2: 29 mEq/L (ref 19–32)
Calcium: 8.7 mg/dL (ref 8.4–10.5)
Calcium: 9 mg/dL (ref 8.4–10.5)
Chloride: 94 mEq/L — ABNORMAL LOW (ref 96–112)
Chloride: 97 mEq/L (ref 96–112)
Creatinine, Ser: 0.89 mg/dL (ref 0.4–1.5)
Creatinine, Ser: 0.97 mg/dL (ref 0.4–1.5)
GFR calc Af Amer: >60 mL/min (ref >60)
GFR calc Af Amer: >60 mL/min (ref >60)
GFR calc non Af Amer: >60 mL/min (ref >60)
GFR calc non Af Amer: >60 mL/min (ref >60)
Glucose, Bld: 81 mg/dL (ref 70–99)
Glucose, Bld: 168 mg/dL — ABNORMAL HIGH (ref 70–99)
Potassium: 2.9 mEq/L — ABNORMAL LOW (ref 3.5–5.1)
Potassium: 3.5 mEq/L (ref 3.5–5.1)
Sodium: 139 mEq/L (ref 135–145)
Sodium: 138 mEq/L (ref 135–145)

## 2010-12-13 ENCOUNTER — Inpatient Hospital Stay (HOSPITAL_COMMUNITY): Payer: 59

## 2010-12-13 LAB — PROTEIN, BODY FLUID: Total protein, fluid: <3 g/dL

## 2010-12-13 LAB — BASIC METABOLIC PANEL
BUN: 6 mg/dL (ref 6–23)
CO2: 29 mEq/L (ref 19–32)
Calcium: 8.5 mg/dL (ref 8.4–10.5)
Chloride: 100 mEq/L (ref 96–112)
Creatinine, Ser: 0.86 mg/dL (ref 0.4–1.5)
GFR calc Af Amer: >60 mL/min (ref >60)
GFR calc non Af Amer: >60 mL/min (ref >60)
Glucose, Bld: 154 mg/dL — ABNORMAL HIGH (ref 70–99)
Potassium: 3.3 mEq/L — ABNORMAL LOW (ref 3.5–5.1)
Sodium: 135 mEq/L (ref 135–145)

## 2010-12-13 LAB — LACTATE DEHYDROGENASE, PLEURAL OR PERITONEAL FLUID: LD, Fluid: 120 U/L — ABNORMAL HIGH (ref 3–23)

## 2010-12-13 LAB — HEPARIN LEVEL (UNFRACTIONATED): Heparin Unfractionated: 0.26 IU/mL — ABNORMAL LOW (ref 0.30–0.70)

## 2010-12-13 LAB — AMYLASE, BODY FLUID: Amylase, Fluid: 10 U/L

## 2010-12-13 LAB — APTT: aPTT: 36 seconds (ref 24–37)

## 2010-12-13 LAB — PROTIME-INR
INR: 1.47 (ref 0.00–1.49)
Prothrombin Time: 18 seconds — ABNORMAL HIGH (ref 11.6–15.2)

## 2010-12-13 LAB — GLUCOSE, SEROUS FLUID: Glucose, Fluid: 102 mg/dL

## 2010-12-14 ENCOUNTER — Inpatient Hospital Stay (HOSPITAL_COMMUNITY): Payer: 59

## 2010-12-14 LAB — CBC
HCT: 39 % (ref 39.0–52.0)
Hemoglobin: 12.5 g/dL — ABNORMAL LOW (ref 13.0–17.0)
MCH: 27.4 pg (ref 26.0–34.0)
MCHC: 32.1 g/dL (ref 30.0–36.0)
MCV: 85.5 fL (ref 78.0–100.0)
Platelets: 287 10*3/uL (ref 150–400)
RBC: 4.56 MIL/uL (ref 4.22–5.81)
RDW: 14.1 % (ref 11.5–15.5)
WBC: 10.4 10*3/uL (ref 4.0–10.5)

## 2010-12-14 LAB — PH, BODY FLUID: pH, Fluid: 8

## 2010-12-14 LAB — PROTIME-INR
INR: 1.19 (ref 0.00–1.49)
Prothrombin Time: 15.3 seconds — ABNORMAL HIGH (ref 11.6–15.2)

## 2010-12-14 LAB — BASIC METABOLIC PANEL
BUN: 6 mg/dL (ref 6–23)
CO2: 28 mEq/L (ref 19–32)
Calcium: 8.6 mg/dL (ref 8.4–10.5)
Chloride: 100 mEq/L (ref 96–112)
Creatinine, Ser: 0.85 mg/dL (ref 0.4–1.5)
GFR calc Af Amer: >60 mL/min (ref >60)
GFR calc non Af Amer: >60 mL/min (ref >60)
Glucose, Bld: 86 mg/dL (ref 70–99)
Potassium: 3.8 mEq/L (ref 3.5–5.1)
Sodium: 139 mEq/L (ref 135–145)

## 2010-12-14 LAB — HEPARIN LEVEL (UNFRACTIONATED): Heparin Unfractionated: 0.13 IU/mL — ABNORMAL LOW (ref 0.30–0.70)

## 2010-12-15 LAB — CBC
HCT: 39.3 % (ref 39.0–52.0)
Hemoglobin: 12.7 g/dL — ABNORMAL LOW (ref 13.0–17.0)
MCH: 28 pg (ref 26.0–34.0)
MCHC: 32.3 g/dL (ref 30.0–36.0)
MCV: 86.8 fL (ref 78.0–100.0)
Platelets: 282 10*3/uL (ref 150–400)
RBC: 4.53 MIL/uL (ref 4.22–5.81)
RDW: 14.3 % (ref 11.5–15.5)
WBC: 13.6 10*3/uL — ABNORMAL HIGH (ref 4.0–10.5)

## 2010-12-16 LAB — CBC
HCT: 39.2 % (ref 39.0–52.0)
Hemoglobin: 12.7 g/dL — ABNORMAL LOW (ref 13.0–17.0)
MCH: 27.5 pg (ref 26.0–34.0)
MCHC: 32.4 g/dL (ref 30.0–36.0)
MCV: 85 fL (ref 78.0–100.0)
Platelets: 264 10*3/uL (ref 150–400)
RBC: 4.61 MIL/uL (ref 4.22–5.81)
RDW: 14.1 % (ref 11.5–15.5)
WBC: 15.2 10*3/uL — ABNORMAL HIGH (ref 4.0–10.5)

## 2010-12-16 LAB — BODY FLUID CULTURE: Culture: NO GROWTH

## 2010-12-16 NOTE — Consult Note (Signed)
NAME:  Jesse Suarez, Jesse Suarez NO.:  [id number]  MEDICAL RECORD NO.:  [id number]           PATIENT TYPE:  E  LOCATION:  [facility]                         FACILITY:  [facility]  PHYSICIAN:  Pearlean Brownie, M.D.DATE OF BIRTH:  1960/10/09  DATE OF CONSULTATION:  12/08/2010 DATE OF DISCHARGE:  12/08/2010                                CONSULTATION   PRIMARY CARE PROVIDER:  Dr. Cleta Alberts at [facility].  CHIEF COMPLAINT:  Atrial fibrillation and decreased oxygenation status.  HISTORY OF PRESENT ILLNESS:  Jesse Suarez is a 50 year old male with recent admission for atrial fibrillation, hyperthyroidism and pneumonia who presented to his regular PCP today who was found to not be rate control with a rate of 160 due to his atrial fibrillation and as well as at 88% on room air.  The patient denies any type of fevers, chills, nausea, vomiting, diarrhea, constipation, chest pain, shortness of breath or dyspnea on exertion.  States that he has had a little cough and has had some anxiety attacks but otherwise has felt rather well.  He has been weak recently and since his admission has not felt sick.  The patient states that actually overall, he has been feeling better day by day.  The patient states that when he did have his EKG that there was a potential for him having a little bit of an anxiety attack though he felt.  PAST MEDICAL HISTORY: 1. New-onset AFib back on November 18, 2010 admission. 2. Hyperthyroidism. 3. Hypertension. 4. Hyperlipidemia. 5. Anxiety/depression.  MEDICATIONS: 1. Simvastatin 80 mg at bedtime. 2. Rivaroxaban 20 mg p.o. daily. 3. Metoprolol succinate 150 mg p.o. b.i.d. 4. Ativan 0.5 mg p.o. t.i.d. as needed. 5. Diltiazem 240 mg p.o. daily. 6. Aspirin 81 mg daily. 7. Niaspan 1 tablet p.o. daily. 8. Prozac 60 mg p.o. q.a.c. 9. Methimazole 15 mg p.o. b.i.d.  ALLERGIES:  The patient is allergic to CODEINE and SEPTRA.  PAST SURGICAL HISTORY:  He has had an  inguinal hernia repair as well as had his gallbladder removed.  SOCIAL HISTORY:  He lives with his wife and kid.  Smokes approximately half pack per day.  No alcohol.  No illicit.  FAMILY HISTORY:  Mother had AFib and died of an MI at the age of 67.  PHYSICAL EXAMINATION:  VITAL SIGNS:  The patient had a temperature of 97.3, heart rate of 110, respirations of 22, blood pressure 122/76, 92% on room air. GENERAL:  The patient generally in no apparent distress, alert and oriented x3. HEENT:  Pupils equal, round, reactive to light and accommodation.  Moist mucous membranes. CARDIOVASCULAR:  Irregular heart rate and rhythm.  No murmur appreciated. LUNGS:  Clear to auscultation bilaterally. ABDOMEN:  Bowel sounds positive, nontender, nondistended. EXTREMITIES:  No edema.  5/5 strength.  Neurovascularly intact. NEURO:  Cranial nerves II-XII intact.  LABORATORY STUDIES:  The patient's BMET showed a sodium of 140, potassium of 4.0, chloride of 101 and bicarb of 26, BUN of 3 and creatinine of 0.7 and a glucose of 127.  The patient had a chest x-ray that has questionable atelectasis versus infiltrates bilaterally.  ASSESSMENT/PLAN:  Jesse Suarez is a 50 year old male with a recent admission for a new-onset atrial fibrillation, hypothyroidism and history of anxiety who is presenting with increased oxygen requirement and atrial fibrillation with rapid ventricular response not rate controlled at his primary care physician. 1. Increase oxygen demand.  The patient has been on room air since I     have seen him. At this point, we did walk him.  He did desat into     approximately 89%.  The patient has no focal findings on physical     exam, appears that this could be closer to his baseline.  Looking     at the chest x-ray with a linear pattern I would consider more     atelectasis than pneumonia at this time.  He has had no fever, no     shortness of breath and no sputum production.  We will get  the     patient a prescription for azithromycin to have on hand, if any of     these symptoms do recur. 2. Atrial fibrillation.  The patient seems to be much for rate     controlled, does have times where it comes up to 180 and blood     pressure is about 100-110 systolically but has not changed     dramatically with increasing the beta blocker.  We will increase     him up to 200 mg of the metoprolol and see if he gets better rate     control.  The patient could do this as an outpatient setting if he     feels comfortable.  Gave him red flags to look out for the blood     pressure to do well. 3. Hyperthyroidism, methimazole 15 mg p.o. b.i.d.  The patient is     going to be following up with Endocrinology at approximately next     month.  Will be following up with Cardiology.  Thereafter, the     patient will be considered for cardioversion, possibly he is on     Rivaroxaban which he needs to continue for at least 3 months as     well. 4. Hyperlipidemia.  We will continue with simvastatin. 5. Hypertension controlled.  No changes other than increase in his     metoprolol to 200.  Gave the patient red flags for hypotensive type     symptoms. 6. Depression and anxiety.  Likely, the patient had an anxiety attack     while the patient was at his primary care.  We will increase his     Ativan at this time to 1 mg three times a day to see how the     patient does and continue his Prozac.  Likely, he is still feeling     anxious secondary to his thyroid and this could be something that     hopefully Endocrinology will possibly take care of.  DISPOSITION:  The patient will be discharged home with close followup with Dr. Cleta Alberts as well as Endocrinology which he has a scheduled appointment for.  The patient to be still out of work until early April. At that point, we will consider what we need to do at this time.     Antoine Primas, DO   ______________________________ Pearlean Brownie,  M.D.    ZS/MEDQ  D:  12/08/2010  T:  12/09/2010  Job:  [id number]  Electronically Signed by Antoine Primas  on 12/09/2010 03:32:29 PM Electronically Signed by Pearlean Brownie M.D.  on 12/16/2010 03:44:53 PM

## 2010-12-17 LAB — CBC
HCT: 37.7 % — ABNORMAL LOW (ref 39.0–52.0)
Hemoglobin: 12.1 g/dL — ABNORMAL LOW (ref 13.0–17.0)
MCH: 27.4 pg (ref 26.0–34.0)
MCHC: 32.1 g/dL (ref 30.0–36.0)
MCV: 85.3 fL (ref 78.0–100.0)
Platelets: 220 10*3/uL (ref 150–400)
RBC: 4.42 MIL/uL (ref 4.22–5.81)
RDW: 14.1 % (ref 11.5–15.5)
WBC: 12.6 10*3/uL — ABNORMAL HIGH (ref 4.0–10.5)

## 2010-12-18 LAB — CBC
HCT: 39.7 % (ref 39.0–52.0)
Hemoglobin: 12.8 g/dL — ABNORMAL LOW (ref 13.0–17.0)
MCH: 27.5 pg (ref 26.0–34.0)
MCHC: 32.2 g/dL (ref 30.0–36.0)
MCV: 85.2 fL (ref 78.0–100.0)
Platelets: 229 10*3/uL (ref 150–400)
RBC: 4.66 MIL/uL (ref 4.22–5.81)
RDW: 14.1 % (ref 11.5–15.5)
WBC: 11.6 10*3/uL — ABNORMAL HIGH (ref 4.0–10.5)

## 2010-12-22 NOTE — Consult Note (Signed)
NAME:  Jesse Suarez, Jesse Suarez NO.:  [id number]  MEDICAL RECORD NO.:  [id number]           PATIENT TYPE:  I  LOCATION:  2923                         FACILITY:  [facility]  PHYSICIAN:  Reather Littler, M.D.       DATE OF BIRTH:  December 02, 1960  DATE OF CONSULTATION:  12/11/2010 DATE OF DISCHARGE:                                CONSULTATION   REASON FOR CONSULTATION:  Hyperthyroidism.  HISTORY:  This is a 50 year old Caucasian male admitted for the first time on November 19, 2010, for nonspecific symptoms of cough.  However, it was apparent that he was in atrial fibrillation.  The patient did appear to have some symptoms of palpitations prior to this, but not significantly enough to report this.  Also, he had been feeling more tired for a few weeks.  He had also lost about 20 pounds without necessarily trying.  He did not have any symptoms of excessive heat intolerance or sweating.  He did not have any change in appetite or bowel movements.  He, however, did have some symptoms of some shakiness, no symptoms of insomnia.  The patient was discharged home on metoprolol 150 mg twice a day which helped him with his palpitations and shakiness.  He was also started on methimazole 15 mg twice a day along with diltiazem and aspirin.  The patient was admitted at this time yesterday because of symptoms of shortness of breath and palpitation and he was found to be in fast atrial fibrillation as well as some evidence of CHF.  The patient says that he had been taking his methimazole at home.  MEDICATIONS AT HOME: 1. Simvastatin. 2. Rivaroxaban. 3. Prozac. 4. Niaspan. 5. Metoprolol 200 mg. 6. Methimazole 15 mg twice a day. 7. Lorazepam p.r.n. 8. Diltiazem 240 mg. 9. Aspirin.  ALLERGIES:  Intolerant to CODEINE and allergic to SULFA with unknown reaction.  PAST HISTORY:  He has history of hypertension, depression, hyperlipidemia and obesity.  No history of diabetes.  Also, has history of  inguinal hernia and cholecystectomy, also has some history of anxiety.  SOCIAL HISTORY:  He smokes half a pack a day, does not drink excessive alcohol.  He works at [employer] which does not involve the lot of physical activity.  FAMILY HISTORY:  No history of diabetes.  Positive for coronary artery disease.  He thinks both his parents had unknown thyroid disease.  REVIEW OF SYSTEMS:  As above.  He has had hypertension and hyperlipidemia and other system review as in HPI.  Currently, the patient is feeling somewhat improved with his palpitations.  He says that occasionally he does get hot and cold sensations.  PHYSICAL EXAMINATION:  GENERAL:  The patient is alert and oriented. VITAL SIGNS:  His pulse rate is 100 and irregular, his blood pressure is 118/88, temperature normal. HEENT:  He has no pallor or skin lesions.  He has got a mild stare to his eyes and some lid retraction of the upper eyelids.  There is a mild lid lag present.  No proptosis or ecchymoses.  Oral mucosa is normal. NECK:  Thyroid is not palpable.  No lymphadenopathy  in the neck.CARDIAC:  Heart sounds are normal with irregular rate. LUNGS:  Clear. ABDOMEN:  No tenderness or mass. EXTREMITIES:  No edema or skin lesions.  His reflexes are slightly brisk, although difficult to elicit.  No tremor present.  ASSESSMENT:  He has probable Grave disease with hyperthyroidism with clearly abnormal thyroid levels before treatment.  Surprisingly, his I- 131 uptake was only 29%.  PLAN:  Would be to get him to the euthyroid state and then treat him with I-131.  This was discussed in detail with the patient and he agrees with the plan.     Reather Littler, M.D.     AK/MEDQ  D:  12/11/2010  T:  12/12/2010  Job:  [id number]  cc:   Jesse Suarez, M.D. Jesse Suarez, M.D.  Electronically Signed by Reather Littler M.D. on 12/22/2010 08:39:56 AM

## 2010-12-24 NOTE — Consult Note (Signed)
NAME:  Jesse, Suarez NO.:  [id number]  MEDICAL RECORD NO.:  [id number]           PATIENT TYPE:  I  LOCATION:  2923                         FACILITY:  [facility]  PHYSICIAN:  Colleen Can. Deborah Chalk, M.D.DATE OF BIRTH:  Aug 20, 1961  DATE OF CONSULTATION: DATE OF DISCHARGE:                                CONSULTATION   PRIMARY CARDIOLOGIST:  Doylene Canning. Ladona Ridgel, MD  PRIMARY CARE:  [facility].  PATIENT PROFILE:  A 50 year old male with relatively recently diagnosed hyperthyroidism/Graves disease, and rapid AFib whom we have been asked to reevaluate secondary to ongoing rapid AFib and dyspnea.  PROBLEMS: 1. Hyperthyroidism/Graves disease.     a.     Currently on methimazole therapy and pending I-131. 2. Atrial fibrillation with rapid ventricular response.     a.     November 19, 2010, 2-D echocardiogram EF 60%, mild LVH. 3. Moderately large right pleural effusion. 4. Hypertension. 5. Hyperlipidemia. 6. Obesity. 7. Depression. 8. Status post inguinal hernia repair. 9. Status post cholecystectomy. 10.Status post right knee cyst removal. 11.Hypokalemia requiring supplementation.  ALLERGIES:  CODEINE and SEPTRA.  HISTORY OF PRESENT ILLNESS:  A 50 year old male who earlier this month was admitted with AFib with RVR and found to have hypothyroidism with TSH of less than 0.008 on November 19, 2010.  He was hospitalized for approximately 2 weeks and his AFib was managed with rate control including beta-blocker and diltiazem therapy.  He has a CHADS2 score of 1 and was eventually placed on rivaroxaban anticoagulation.  Following discharge, unfortunately the patient has continued to have weakness, fatigue, and dyspnea on exertion prompting re-presentation and admission on December 10, 2010.  On admission, he was noted to have a moderately large right pleural effusion with airspace disease as well as mild left pleural effusion.  There was some question as whether or not this  was pulmonary edema as the patient's BNP was only 76.  The patient was treated with IV Lasix for 1 day without significant change.  Throughout this hospitalization until this point, heart rates have been fairly erratic with rates anywhere from 80s to 160s.  We have been asked to reevaluate.  Of note, on methimazole therapy, the patient is now euthyroid with free T4 of 1.11.  CURRENT MEDICATIONS: 1. Aspirin 81 mg daily. 2. Lipitor 40 mg daily. 3. Diltiazem 60 mg q.6 h. 4. Methimazole 15 mg b.i.d. 5. Metoprolol 200 mg b.i.d. 6. Prozac 60 mg daily. 7. Rivaroxaban 20 mg daily. 8. Keflex 500 mg b.i.d.  FAMILY HISTORY:  Mother died at 4 with MI and irregular heartbeat. Father has a history of heart murmur and hypertension, is alive. Brother is alive and well.  SOCIAL HISTORY:  The patient lives in [place].  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  He has a 15-pack year history of tobacco abuse and still smokes, but he is trying to quit.  He denies alcohol or drugs. He does not routinely exercise.  REVIEW OF SYSTEMS:  Positive for rash on his right elbow.  He has two- pillow orthopnea.  He has dyspnea with any activity.  He is not complaining  of any significant palpitations, chest pain.  He has had some diarrhea.  He does report depression, anxiety.  He is full code. Otherwise all systems reviewed and is negative.  PHYSICAL EXAMINATION:  VITAL SIGNS:  He is afebrile, heart rate 96-143, blood pressure is 104/56, respirations 20, pulse ox 93% on room air. GENERAL:  Pleasant white male in no acute distress, awake, alert, and oriented x3.  He has a normal affect. HEENT:  Normal.  Nares grossly intact, nonfocal. SKIN:  Warm and dry without lesions or masses. NECK:  Supple without bruits or JVD. LUNGS:  Respirations are regular and unlabored with diminished breath sounds in the right base and crackles just above that. CARDIAC:  Irregularly irregular, tachycardia, S1 and S2, no S3,  S4, or murmurs. ABDOMEN:  Round, soft, nontender, nondistended.  Bowel sounds present. EXTREMITIES:  Warm, dry, and pink.  No clubbing, cyanosis, or edema. Dorsalis pedis and posterior tibialis 2+ and equal bilaterally.  Chest x-ray yesterday shows stable moderate large right pleural effusion, right lower lobe airspace disease with mild left pleural effusion, left lower lobe airspace disease.  No heart failure or edema noted.  No cardiomegaly.  EKG shows AFib, rate of 131, nonspecific ST changes.  No acute ST changes.  Hemoglobin 13.1, hematocrit 39.8, WBC 15.8, platelets 347.  Sodium 139, potassium 2.9, chloride 94, CO2 of 32, BUN 5, creatinine 0.89, glucose 81, total bilirubin 1, alkaline phosphatase 24, AST 36, ALT 37, total protein 67, albumin 3.1, free T4 of 1.113, T3 of 3.7, calcium 8.7, magnesium 1.9.  Cardiac enzymes are negative.  BNP was 76.  Total cholesterol 75, triglycerides 64, HDL 34, LDL 28.  ASSESSMENT/PLAN: 1. Atrial fibrillation with rapid ventricular response.  This was     initially diagnosed in the setting of hyperthyroidism, which is now     being treated and the patient is euthyroid.  Unfortunately, the     patient's weight, despite fairly high-dose beta-blocker and calcium     channel blocker remains erratic and can reach as high as 160s with     minimal activity.  The patient has been anticoagulated with     rivaroxaban since earlier this month.  The patient's main complaint     at this time is not palpitations, but dyspnea, which certainly     could be attributed to his rapid rate and possibility of some     diastolic heart failure, although he has no evidence of volume     overload at this time and his BNP was within normal limits on     admission.  I suspect the patient's pleural effusions, specifically     the large right pleural effusion is playing a role in his dyspnea     and will need to be evaluated.  As such, we would recommend holding      rivaroxaban and bridging the patient with heparin which we can     start 24 hours after discontinuation of rivaroxaban (10 a.m.     tomorrow).  We will obtain decubitus films to reevaluate the right     pleural effusion and ask Interventional Radiology to consider     diagnostic and therapeutic thoracentesis.  The patient is a long-     time smoker.  Suspect thoracentesis would provide some significant     relief from dyspnea and thus make it more likely that the patient     would maintain sinus rhythm following cardioversion.  We can  arrange for TEE and cardioversion on Monday, though it may be     prudent to await pathology from thoracentesis to ensure that it is     not malignant effusion and may require additional invasive     procedures prior to committing the patient to anticoagulation going     forward. 2. Moderately large right pleural effusion as above, followup     decubitus films and ask Interventional Radiology with regards to     thoracentesis.  Also noted that the patient is rivaroxaban, which     we will hold and switch to heparin starting tomorrow morning. 3. Hyperthyroidism.  As above, patient is euthyroid and is being     followed up closely by Endocrinology with eventual plan for I-131. 4. Hypokalemia, supplemented by primary team. 5. Alcohol and tobacco abuse.  Smoking cessation was advised. 6. ? diastolic congestive heart failure.  The patient's blood pressure     is well-controlled while his heart rate does remain high.  Continue     to work on rate control/rhythm control as above.  He appears     euvolemic at this time.  I do not feel he requires additional     diuresis.     Nicolasa Ducking, ANP   ______________________________ Colleen Can. Deborah Chalk, M.D.    CB/MEDQ  D:  12/12/2010  T:  12/13/2010  Job:  [id number]  Electronically Signed by Nicolasa Ducking ANP on 12/23/2010 04:12:08 PM Electronically Signed by Roger Shelter M.D. on 12/24/2010  11:16:45 AM

## 2011-01-02 NOTE — Discharge Summary (Signed)
NAME:  Jesse Suarez, Jesse Suarez NO.:  [id number]  MEDICAL RECORD NO.:  [id number]           PATIENT TYPE:  E  LOCATION:  [facility]                         FACILITY:  [facility]  PHYSICIAN:  Santiago Bumpers. , M.D.DATE OF BIRTH:  16-Oct-1960  DATE OF ADMISSION:  11/18/2010 DATE OF DISCHARGE:  11/26/2010                              DISCHARGE SUMMARY   DISCHARGE DIAGNOSES: 1. Atrial fibrillation. 2. Hyperthyroidism. 3. Pneumonia. 4. Hypertension. 5. Leukocytosis. 6. Hyperlipidemia. 7. Anxiety.  DISCHARGE MEDICATIONS:  New medications: 1. Diltiazem 240 mg p.o. daily. 2. Lorazepam 0.5 mg half to one tablet p.o. t.i.d. p.r.n. 3. Methimazole 15 mg p.o. b.i.d. 4. Metoprolol succinate 150 mg p.o. b.i.d. 5. Rivaroxaban 20 mg p.o. daily, take for 3 months.  He is to continue the following medications: 1. Aspirin 81 mg p.o. b.i.d. 2. Mucinex-D 1 tablet p.o. b.i.d. 3. Niaspan 1 tablet p.o. b.i.d. 4. Prozac 20 mg 3 tablets p.o. q.a.m. 5. Simvastatin 80 mg 1 tablet p.o. nightly.  He is to stop taking the following medications: 1. Lisinopril/hydrochlorothiazide 10/12.5 mg.  CONSULTS:  Cardiology.  IMAGING/STUDIES:  He had a chest x-ray on March 6, impression, minimal bibasilar atelectasis.  He had a chest x-ray on March 7, impression, heart and vascular remained normal, new density in the lingula and possibly in the right upper lobe, cannot exclude acute pneumonia.  He had a thyroid uptake scan on November 22, 2010, there is uniform uptake within the thyroid gland.  No nodularity.  The thyroid gland appears normal volume, 24-hour iodine-131 uptake equals 29% with normal being 10%-30%.  Impression, normal uptake and scan.  Findings could represent early Graves disease however.  LABORATORY DATA:  At the time of admission, CBC with WBC of 21.5, hemoglobin 14.3, hematocrit 41.9, platelets 252.  Sodium was 137, potassium 3.8, chloride 106, bicarb 25, BUN of 7, creatinine 0.70, glucose  of 106, D-dimer was less than 0.22.  Cardiac markers were negative x2.  Lactic acid was 1.5.  BNP was 231.  TSH was less than 0.008.  During the hospitalization, lipid profile showed cholesterol of 75, triglycerides of 64, HDL 34, LDL 28, hemoglobin A1c was 5.7, free T4 was 3.02, T3 was 7.8.  Thyroglobulin antibody was less than 20.  Thyroid peroxidase antibody was less than 10.  Blood cultures were negative x2. At the time of discharge, WBCs were 16.6, hemoglobin 12.3, hematocrit 37.5, platelets 302.  Sodium 137, potassium 3.6, chloride 102, bicarb 26, BUN of 8, creatinine 0.69, and glucose 110.  BRIEF HOSPITAL COURSE:  This is a 49 year old male with new-onset atrial fibrillation. 1. New-onset atrial fibrillation.  Initially, the patient was admitted     to Step-Down Unit and started on Cardizem drip.  Workup was     initiated for calls of atrial fibrillation including cardiac     enzymes, TSH, and echocardiogram were performed.  Echocardiogram     showed mild LVH with EF of 60%, regional wall abnormalities cannot     be excluded.  Study has not taken sufficient to evaluation of LV     diastolic function.  Left atrium was moderately dilated.  Right  ventricle was mildly dilated.  PA peak pressure was 37 mmHg.  His     TSH was found to be extremely low at less than 0.008.  His cardiac     enzymes were negative.  He was risk stratified during the admission     with hemoglobin A1c as well as fasting lipid panel.  During the     hospitalization while on Cardizem drip, he did become hypotensive,     so Cardiology was consulted to help manage his atrial fibrillation.     At that time, he was started on digoxin and his Cardizem drip was     cut back.  Once his blood pressure improved, he was started on p.o.     Cardizem as well as continued on the digoxin.  His rate still     remained high up into the 160s in atrial fibrillation.  Metoprolol     succinate was then added at that time and  titrated up.  His heart     rate showed little response initially with titration of the     metoprolol, however, he was able to be slightly rate controlled     into the 110s on metoprolol succinate 150 mg b.i.d.  This was     decided that this was the best doses for him at this time until his     thyroid is under better control.  He was started on rivaroxaban     during the hospitalization for anticoagulation.  Per Cardiology's     note at the time of discharge, this patient is euthyroid at     followup with Cardiology, plan for possible DC cardioversion if     still in atrial fibrillation. 2. Hyperthyroidism.  TSH found to be less than 0.008.  This was felt     to be the cause of his atrial fibrillation.  He was started on     Tapazole during the admission.  This was felt to be likely early     Graves with a thyroid uptake scan on the upper limit of normal.     The patient did want to do radioactive iodine for treatment of his     hyperthyroidism at the time of discharge.  He was given information     for endocrinologist in [place] to follow up with to discuss     starting radioactive iodine therapy. 3. Pneumonia.  Probable pneumonia seen on chest x-ray.  The patient     was also had elevated white blood cell count.  The patient was     treated for community-acquired pneumonia with Rocephin and     azithromycin.  The patient did well with treatment and WBCs trended     down throughout the admission. 4. Leukocytosis.  This was felt to be secondary to his pneumonia.     However, WBCs remained in the low to mid teens throughout the     entire admission even after treatment of pneumonia.  The patient     reported no further shortness of breath, nausea, vomiting,     diarrhea, or urinary symptoms during the admission.  Recommend to     follow up CBC at his follow-up appointment to ensure this is     resolving. 5. Hypertension.  The patient initially hypotensive, so home     medications  were held while on Cardizem drip.  With addition of     p.o. Cardizem as well as metoprolol, his pressures improved.  His     lisinopril and hydrochlorothiazide were discontinued so as not to     drop his pressure too much with the medications for rate control. 6. Hyperlipidemia.  He was continued on simvastatin throughout the     admission.  Lipid panel showed excellent control of lipids. 7. Anxiety.  This was thought to be secondary to his hyperthyroidism.     He is on prescription for Ativan prior to discharge to help with     his anxiety symptoms.  DISCHARGE INSTRUCTIONS:  The patient was discharged with instructions to increase activity slowly.  He will follow a low-sodium diet.  He was reminded that if he develop chest pain or worsening of symptoms, he should return to the ER.  FOLLOWUP:  He will follow up with Dr. Cleta Alberts at [facility].  He was instructed to call and make an appointment within the next 1-2 weeks.  He is to follow up with Dr. Ladona Ridgel in Cardiology.  He was given an appointment with followup.  He was given information for followup with [facility] for starting treatment of iodine-131.  FOLLOWUP ISSUES/RECOMMENDATIONS:  Recommend follow up of WBCs to ensure this is resolving.  Recommend follow up to ensure the patient has an appointment with Endocrinology for iodine-131 treatment.  DISCHARGE CONDITION:  The patient is discharged home in stable medical condition.    ______________________________ Everrett Coombe, MD   ______________________________ Santiago Bumpers. Leveda Anna, M.D.    CM/MEDQ  D:  12/03/2010  T:  12/04/2010  Job:  [id number]  cc:   Brett Canales A. Cleta Alberts, M.D.  Electronically Signed by Everrett Coombe MD on 12/16/2010 09:24:02 PM Electronically Signed by Doralee Albino M.D. on 01/02/2011 04:40:27 PM

## 2011-01-12 NOTE — H&P (Signed)
NAME:  Jesse, Suarez NO.:  [id number]  MEDICAL RECORD NO.:  [id number]           PATIENT TYPE:  I  LOCATION:  2923                         FACILITY:  [facility]  PHYSICIAN:  Paula Compton, MD        DATE OF BIRTH:  25-Oct-1960  DATE OF ADMISSION:  12/10/2010 DATE OF DISCHARGE:                             HISTORY & PHYSICAL   PRIMARY CARE PHYSICIAN:  [facility].  CHIEF COMPLAINT:  Shortness of breath.  HISTORY OF PRESENT ILLNESS:  Jesse Suarez is a 50 year old man with recent history of new onset atrial fibrillation in the setting of Graves disease who presents with shortness of breath x2 days.  The patient states that he has had multiple episodes of shortness of breath, weakness, and fatigue that lasted approximately 40 minutes, had 2-3 episodes yesterday, and had approximately 4 episodes today, called EMS secondary to shortness of breath today, was placed on CPAP while in ambulance due to increased work of breathing and sats in the low 90s and high 80s.  On arrival to ER, the patient placed on nasal cannula O2 and sats maintained in the low 90s.  When O2 discontinued, the patient's pulse ox decreased to 85-89% on room air.  The patient has increased work of breathing on arrival to the ER, heart rate in the 80s-120s. Shortness of breath is relieved by laying on right side.  No chest pain, no fever, no nausea or vomiting, and no diaphoresis.  ALLERGIES:  CODEINE, SEPTRA, and LATEX.  MEDICATIONS: 1. Simvastatin 80 mg p.o. at bedtime. 2. Rivaroxaban 20 mg p.o. daily. 3. Metoprolol 200 mg p.o. b.i.d. 4. Ativan 1 mg p.o. t.i.d. 5. Diltiazem 240 mg p.o. daily. 6. Aspirin 81 mg p.o. daily. 7. Niaspan 1 tab p.o. daily. 8. Prozac 60 mg p.o. daily. 9. Methimazole 15 mg p.o. b.i.d.  PAST MEDICAL HISTORY: 1. Hypertension. 2. Atrial fibrillation. 3. Bell palsy. 4. Depression and anxiety. 5. Hypercholesterolemia. 6. Hyperthyroidism. 7. Pneumonia.  PAST  SURGICAL HISTORY: 1. Cholecystectomy. 2. Knee surgery.  SOCIAL HISTORY:  The patient lives with wife and son.  He works at [employer].  He smokes 1/2 pack per day of tobacco.  He denies alcohol or drugs.  FAMILY HISTORY:  Mother with thyroid problems, irregular heart rate, and MI.  Father with thyroid problems, liver cirrhosis secondary to EtOH abuse.  Siblings are healthy.  REVIEW OF SYSTEMS:  Positive for occasional headache and occasional diarrhea x2 days and positive for cold intolerance.  No fevers, no chills, no sweats, no sore throat, no chest pain, no edema, no orthopnea, no PND, no palpitations, no cough, no wheezing.  Positive dyspnea.  No urinary problems.  No bowel problems.  No rashes.  No myalgias.  No dysarthria.  No visual changes or bruising.  PHYSICAL EXAMINATION:  VITAL SIGNS:  Temp 98.3, pulse 108, respiratory rate 25, blood pressure 118/92, pulse ox 95% on 4 liters nasal cannula, 87% on room air. GENERAL:  No acute distress, resting quietly. HEENT:  Normocephalic, atraumatic.  Pupils equal, round, and reactive to light and accommodation.  Dry mucous membranes. NECK:  No lymphadenopathy.  No thyromegaly. CARDIOVASCULAR:  Irregular rhythm, increased heart rate.  No murmurs, rubs, or gallops. LUNGS:  Diminished in bases bilateral, right worse than left.  No wheezing.  Scattered crackles in right lower lobe.  Increased work of breathing.  Increased respiratory rate. ABDOMEN:  Soft, nontender, nondistended. EXTREMITIES:  Trace lower extremity edema. NEUROLOGIC:  Alert and oriented x3.  Cranial nerve exam II through XII grossly intact.  Good strength 5/5 in upper and lower extremities and is moving all 4 extremities.  No joint redness.  No rash.  LABS AND STUDIES:  INR 2.32.  Cardiac enzymes, point-of-care myoglobin 57.8, CK-MB less than 1, troponin less than 0.05.  Sodium 139, potassium 3.9, BUN 13, creatinine 1, glucose 124.  Hemoglobin 14.6.  ASSESSMENT  AND PLAN:  The patient is a 50 year old man with history of atrial fibrillation and Graves disease, admitted for shortness of breath.  1. Shortness of breath.  Chest x-ray shows right effusion in the     setting of bilateral pulmonary opacities, worsened since portable     chest x-ray on December 08, 2010.  We will obtain 2-view AP and     lateral chest x-ray films for better visualization.  We will give     Lasix 40 mg IV x1 since this most likely is edema.  BNP is pending.     Also cannot exclude possibility of pulmonary embolism in the     setting of increased shortness of breath and O2 requirements.  The     patient is currently on rivaroxaban 20 mg p.o. daily which is the     treatment dose for acute symptomatic pulmonary embolism.  We will     hold on CT scan in the setting of the patient already receiving     treatment of anticoagulation, the patient is on this for at home     for atrial fibrillation.  If pulmonary opacity and symptoms not     improved with Lasix, we will reconsider pneumonia as etiology, but     currently no fever.  White blood cell count is pending, this is not     as likely. 2. Increased heart rate.  The patient has had persistent increase in     the heart rate 110s-120s since arrival to the ER, evaluated in the     ER 2 days ago with episode of heart rate in to 180s.  It will be     difficult to control rate until Graves disease is adequately     treated.  Spoke with Cardiology, Dr. Antoine Poche, who suggested 30 mg     potassium IR now, then 60 mg q.6 hours, this will be a higher dose     than the regular home dose and we can see if this helps to obtain     better rate control.  We will hold dose for SBP less than 100. 3. Hyperthyroidism and Graves disease.  The patient refused     radioactive iodine therapy consult during last admission, has had     difficulty getting appointment with Endocrine.  The patient does     have an appointment scheduled with Dr. Lucianne Muss in  late April 2012.     We would like to see inpatient consult to get treatment.  If     possible, we would like to get radioactive iodine treatment.  We     will consider Endocrine consult in a.m., especially in the setting     of continued difficulty  with rate control. 4. Anxiety.  Continue Ativan 1 mg p.o. t.i.d. p.r.n. 5. Fluids, electrolytes, and nutrition/gastrointestinal.  Regular     diet. 6. Prophylaxis, on rivaroxaban for anticoagulation. 7. Disposition, pending clinical improvement.     Ellin Mayhew, MD   ______________________________ Paula Compton, MD    DC/MEDQ  D:  12/10/2010  T:  12/11/2010  Job:  [id number]  Electronically Signed by Ellin Mayhew  on 12/30/2010 02:29:18 PM Electronically Signed by Paula Compton MD on 01/12/2011 04:52:41 PM

## 2011-01-13 NOTE — Discharge Summary (Signed)
  NAME:  Jesse Suarez, Jesse Suarez NO.:  [id number]  MEDICAL RECORD NO.:  [id number]           PATIENT TYPE:  I  LOCATION:  2014                         FACILITY:  [facility]  PHYSICIAN:  Bevelyn Buckles. , MDDATE OF BIRTH:  Jun 11, 1961  DATE OF ADMISSION:  12/10/2010 DATE OF DISCHARGE:                        DISCHARGE SUMMARY - REFERRING   PROCEDURE:  Direct current cardioversion.  [name] is a 50 year old male with history of diastolic heart failure.  He was admitted with recurrent atrial fibrillation.  He was seen by Dr. Ladona Ridgel and scheduled for cardioversion.  He has been on Xarelto for anticoagulation.  TEE demonstrate an EF of 55% with no left atrial appendage thrombus.  After further sedation by anesthesia, he underwent direct current cardioversion with pads in the anterior and posterior position.  He received one biphasic 200 joules synchronized shock with prompt conversion to sinus rhythm.  There were no apparent complications.     Bevelyn Buckles. , MD     DRB/MEDQ  D:  12/16/2010  T:  12/16/2010  Job:  [id number]  Electronically Signed by Arvilla Meres MD on 01/13/2011 07:12:41 PM

## 2011-01-14 ENCOUNTER — Encounter: Payer: Self-pay | Admitting: Internal Medicine

## 2011-01-15 ENCOUNTER — Ambulatory Visit (INDEPENDENT_AMBULATORY_CARE_PROVIDER_SITE_OTHER): Payer: 59 | Admitting: Internal Medicine

## 2011-01-15 ENCOUNTER — Encounter: Payer: Self-pay | Admitting: Internal Medicine

## 2011-01-15 VITALS — BP 149/97 | HR 81 | Ht 69.0 in | Wt 210.0 lb

## 2011-01-15 DIAGNOSIS — E039 Hypothyroidism, unspecified: Secondary | ICD-10-CM | POA: Insufficient documentation

## 2011-01-15 DIAGNOSIS — E785 Hyperlipidemia, unspecified: Secondary | ICD-10-CM | POA: Insufficient documentation

## 2011-01-15 DIAGNOSIS — I4891 Unspecified atrial fibrillation: Secondary | ICD-10-CM

## 2011-01-15 DIAGNOSIS — I5033 Acute on chronic diastolic (congestive) heart failure: Secondary | ICD-10-CM

## 2011-01-15 DIAGNOSIS — E890 Postprocedural hypothyroidism: Secondary | ICD-10-CM | POA: Insufficient documentation

## 2011-01-15 DIAGNOSIS — I10 Essential (primary) hypertension: Secondary | ICD-10-CM | POA: Insufficient documentation

## 2011-01-15 DIAGNOSIS — I480 Paroxysmal atrial fibrillation: Secondary | ICD-10-CM | POA: Insufficient documentation

## 2011-01-15 DIAGNOSIS — I1A0 Resistant hypertension: Secondary | ICD-10-CM | POA: Insufficient documentation

## 2011-01-15 DIAGNOSIS — E0590 Thyrotoxicosis, unspecified without thyrotoxic crisis or storm: Secondary | ICD-10-CM

## 2011-01-15 NOTE — Progress Notes (Signed)
HPI [name] returns today for followup. He is a 50 year old man with hyperthyroidism and atrial fibrillation with a rapid ventricular response. The patient was hospitalized twice with A. Fib and hyperthyroidism. Ultimately he was placed on Tapazole and once his thyroid dysfunction was controlled, he is maintaining normal sinus rhythm. The patient has remained on anticoagulation for thromboembolic prevention. He now appears at least clinically to be euthyroid. He denies chest pain, shortness of breath, or palpitations. He has not had syncope. Allergies  Allergen Reactions  . Codeine   . Sulfa Drugs Cross Reactors      Current Outpatient Prescriptions  Medication Sig Dispense Refill  . aspirin 81 MG tablet Take 81 mg by mouth daily.        Marland Kitchen diltiazem (DILACOR XR) 240 MG 24 hr capsule Take 240 mg by mouth daily.        Marland Kitchen FLUoxetine (PROZAC) 20 MG capsule Take 20 mg by mouth 3 (three) times daily.        . methimazole (TAPAZOLE) 5 MG tablet Take 5 mg by mouth. Once daily       . rivaroxaban (XARELTO) 10 MG TABS tablet Take 10 mg by mouth. Take 2 tablets daily       . simvastatin (ZOCOR) 80 MG tablet Take 80 mg by mouth at bedtime.        Marland Kitchen DISCONTD: FLUoxetine (PROZAC) 40 MG capsule Take 40 mg by mouth daily.        Marland Kitchen DISCONTD: LORazepam (ATIVAN) 1 MG tablet Take 1 mg by mouth every 8 (eight) hours.        Marland Kitchen DISCONTD: Methimazole 15 MG TABS Take by mouth.        . DISCONTD: metoprolol (TOPROL-XL) 200 MG 24 hr tablet Take 200 mg by mouth 2 (two) times daily.        Marland Kitchen DISCONTD: niacin (NIASPAN) 1000 MG CR tablet Take 1,000 mg by mouth at bedtime.        Marland Kitchen DISCONTD: Rivaroxaban 20 MG TABS Take by mouth.           Past Medical History  Diagnosis Date  . Hypertension   . Arrhythmia   . Bell palsy   . Depression   . Hypercholesterolemia   . Pneumonia   . Diastolic heart failure     ROS:   All systems reviewed and negative except as noted in the HPI.   Past Surgical History    Procedure Date  . Cholecystectomy   . Knee surgery      Family History  Problem Relation Age of Onset  . Heart disease Mother   . Heart attack Mother      History   Social History  . Marital Status: Married    Spouse Name: N/A    Number of Children: 1  . Years of Education: N/A   Occupational History  . Not on file.   Social History Main Topics  . Smoking status: Current Everyday Smoker -- 1.5 packs/day  . Smokeless tobacco: Not on file  . Alcohol Use: No  . Drug Use: No  . Sexually Active:    Other Topics Concern  . Not on file   Social History Narrative  . No narrative on file     BP 149/97  Pulse 81  Ht 5\' 9"  (1.753 m)  Wt 210 lb (95.255 kg)  BMI 31.01 kg/m2  Physical Exam:  Well appearing NAD HEENT: Unremarkable Neck:  No JVD, thyromegally is present Lymphatics:  No adenopathy Back:  No CVA tenderness Lungs:  Clear HEART:  Regular rate rhythm, no murmurs, no rubs, no clicks Abd:  Flat, positive bowel sounds, no organomegally, no rebound, no guarding Ext:  2 plus pulses, no edema, no cyanosis, no clubbing Skin:  No rashes no nodules Neuro:  CN II through XII intact, motor grossly intact  EKG Normal sinus rhythm. Short PR interval with no preexcitation.  Assess/Plan:

## 2011-01-15 NOTE — Assessment & Plan Note (Signed)
He is on a fairly high dose of simvastatin at this point. When I see him back, my plan would be to have him decrease his dose down to 40 mg daily.

## 2011-01-15 NOTE — Assessment & Plan Note (Signed)
His symptoms appear to be well controlled and he is maintaining normal sinus rhythm. I will plan to see him back in several weeks. He will continue his calcium channel blockers. Most importantly the patient appears to be euthyroid.

## 2011-01-15 NOTE — Assessment & Plan Note (Signed)
His blood pressure is a bit elevated today although the patient claims that mostly his blood pressure is fairly well controlled. He will continue his current medications and I have asked him to maintain a low-salt diet.

## 2011-01-15 NOTE — Patient Instructions (Signed)
Your physician recommends that you schedule a follow-up appointment in: 6 weeks with Dr. Taylor  

## 2011-01-15 NOTE — Assessment & Plan Note (Signed)
>>  ASSESSMENT AND PLAN FOR DYSLIPIDEMIA WRITTEN ON 01/15/2011  5:30 PM BY Marinus Maw, MD  He is on a fairly high dose of simvastatin at this point. When I see him back, my plan would be to have him decrease his dose down to 40 mg daily.

## 2011-01-20 ENCOUNTER — Other Ambulatory Visit: Payer: Self-pay | Admitting: Family Medicine

## 2011-01-27 NOTE — H&P (Signed)
NAME:  Jesse Suarez, Jesse Suarez NO.:  [id number]   MEDICAL RECORD NO.:  [id number]          PATIENT TYPE:  EMS   LOCATION:  ED                           FACILITY:  [facility]   PHYSICIAN:  Sandria Bales. Ezzard Standing, M.D.  DATE OF BIRTH:  Mar 06, 1961   DATE OF ADMISSION:  12/26/2007  DATE OF DISCHARGE:                              HISTORY & PHYSICAL   Date of history here ???   CHIEF COMPLAINT:  Gallbladder disease.   REFERRING PHYSICIAN:  Stan Head. Cleta Alberts, M.D.   HISTORY OF PRESENT ILLNESS:  The patient is a 50 year old white male who  goes to [facility] and usually sees Dr. Cleta Alberts or Dr. Merla Riches as  his primary care physician.  He had a period in January when he had some  epigastric pain, but he went to sleep.  After falling asleep he felt  better and ignored the pain and had no further evaluation.   Today, he had the same kind of attack of epigastric pain this morning  and tried to go to sleep but did not feel any better.  He went to see  Dr. Cleta Alberts.  Dr. Cleta Alberts sent him to [facility].  I have no typed  reports.  I do have a disk, but have no reports from [facility], but per verbal report from Dr. Cleta Alberts, he has gallbladder  disease.  He also had a white blood cell count done at Dr. Ellis Parents office  which is attached to his records which I think was 18,600.  However,  since leaving Dr. Ellis Parents office, the patient has had no pain.  He did  not get to the emergency room until about 8 o'clock Monday night and I  have been caught up in the OR with another case.  It is now 11:30 PM and  he was anxious to go home since he is feeling good.  He wants to have  his gall bladder surgery set up electively at a later date.   He has a remote history of peptic ulcer disease where he saw Ulyess Mort, M.D.  He had an upper endoscopy and he thinks it was about  1996, but he is on no medicines right now for ulcer disease.  He has no  history of liver disease, pancreatic  disease, or colon disease.  He had  a prior right inguinal hernia repaired by Rose Phi. Young, M.D. in [phone].   ALLERGIES:  CODEINE.  He had this a long time ago and it made him hyper.   CURRENT MEDICATIONS:  1. Niaspan.  2. Prozac for depression.  3. Aspirin he takes before the Niaspan.  4. Simvastatin.   REVIEW OF SYSTEMS:  NEUROLOGY:  No seizure or loss of consciousness.  PULMONARY:  He smokes half pack of cigarettes a day and knows this is  bad for his health, but he works for [employer], what else can I say.  Pnuemothorax managed by Dr. Andrey Campanile - 1996.  CARDIAC:  No history of heart disease or chest pain.  He has not had a  cardiac cath.  GASTROINTESTINAL:  See history of present illness.  GENITOURINARY:  No kidney stones or kidney infections.  MUSCULOSKELETAL:  benign lesion taken from right leg - 1994   SOCIAL HISTORY:  He is married.  His son is  in the room with him.  His  son is morbidly obese and is interested in bariatric surgery.  He works as Arts administrator at [employer].   PHYSICAL EXAMINATION:  VITAL SIGNS:  Temperature 98.4, pulse 63,  respirations 18, blood pressure 145/92.  GENERAL:  He is a well-nourished, pleasant white male, alert and  cooperative with physical examination.  HEENT:  Unremarkable.  NECK:  Supple.  I found no masses or thyromegaly.  LUNGS:  Clear to auscultation.  HEART:  Regular rate and rhythm without murmur or rub.  ABDOMEN:  Totally benign.  He has no guarding, no rebound, no  tenderness.  I do not feel a gall bladder or any organomegaly.  I do not  find any abnormal physcial exam to explain a white blood cell count of  18,000-19,000.  EXTREMITIES:  Good strength in all four extremities.  NEUROLOGY:  Grossly intact.   IMPRESSION:  1. Probable gallbladder disease, though, this is all by a verbal      report.  I do not have any direct records from [facility].  At this hour both [facility] and [facility] are  closed.  The patient is anxious to go home and I think he can be  discharged to home.  I can get all of his records during the day and set  him up electively down the road.  I am assuming he has some biliary  colic today.  If he has recurrent symptoms, he may have to come back to the emergency  room and be taken care of by myself or my partners.  Depending on the  acuity of his symptoms.  1. Hypercholesterolemia.  2. History of depression.  3. Smokes cigarettes.  4. Remote history of ulcer disease.  5. He apparently has a collapsed left lung treated by Delsa Grana.      Andrey Campanile, M.D. in 1996.  6. He had a benign knot taken off of his right leg by Maisie Fus L.      Meade Maw, M.D. in 1994.      Sandria Bales. Ezzard Standing, M.D.  Electronically Signed     DHN/MEDQ  D:  12/26/2007  T:  12/26/2007  Job:  [id number]   cc:   Brett Canales A. Cleta Alberts, M.D.  Fax: [phone]

## 2011-01-27 NOTE — H&P (Signed)
NAME:  Jesse Suarez, Jesse Suarez NO.:  [id number]   MEDICAL RECORD NO.:  [id number]          PATIENT TYPE:  INP   LOCATION:  5123                         FACILITY:  [facility]   PHYSICIAN:  Thornton Park. Daphine Deutscher, MD  DATE OF BIRTH:  May 17, 1961   DATE OF ADMISSION:  01/01/2008  DATE OF DISCHARGE:                              HISTORY & PHYSICAL   CHIEF COMPLAINT:  Upper abdominal pain.   HISTORY:  Jesse Suarez is a 50 year old white male who last week  had an attack of upper abdominal pain and was worked up by Dr. Earl Lites, and apparently was thought to have cholecystitis.  Dr. Ezzard Standing had  been called to see the patient and to schedule him for surgery later.  The patient presented again today with pain, which was presently on the  right side, but it was radiating to his lower abdomen.  Therefore, we  went ahead and got a CT scan, which confirmed fluid and some gallbladder  wall thickening, and evidence of ongoing cholecystitis.  He required  pain medications multiple times tonight, so we were unable to send him  home to bring him back for his elective surgery and admitting him at  this time for to be taken care on the doc-of-the-week service.   PAST MEDICAL HISTORY:  He has allergies to CODEINE, produces hives.   MEDICATIONS:  Include Niaspan, Prozac, baby aspirin, simvastatin,  alprazolam, lisinopril, tramadol, and was taking some ciprofloxacin.   PAST SURGICAL HISTORY:  The patient's previous surgery includes a right  inguinal hernia and a knot removed from his right knee.   SOCIAL HISTORY:  The patient smokes a half pack of cigarettes a day but  does not drink alcohol.  He is married.  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   PHYSICAL EXAMINATION:  VITAL SIGNS:  Afebrile.  Blood pressure 140/90,  pulse 71, and respirations 24.  HEAD:  Normocephalic.  EYES:  Sclerae nonicteric.  Pupils are equal, round, and reactive to  light.  He has no scleral icterus.  NOSE  AND THROAT:  Unremarkable.  NECK:  Supple without adenopathy or thyromegaly.  No bruits noted.  CHEST:  Clear to auscultation.  HEART:  Sinus rhythm without murmurs or gallops.  ABDOMEN:  Mildly obese and with tenderness in the upper abdomen.  EXTREMITIES:  Full range of motion.  NEUROLOGIC:  Alert and oriented x3.  Motor and sensory functions grossly  intact.  PSYCHIATRIC:  The patient seems appropriate and understands the nature  of the procedure.   LABORATORY:  White count 20,500 and hemoglobin 14.8.  Electrolytes were  normal except for glucose 132, lipase is 21.   Ultrasound, mildly thickened gallbladder wall and gallbladder sludge  without evidence of cholelithiasis.  Questionable fatty infiltration of  the liver.  CT scan shows edema of the gallbladder wall consistent with  cholecystitis.   IMPRESSION:  Acute cholecystitis, recurrent.   PLAN:  Laparoscopic cholecystectomy per the [facility] MD service.      Thornton Park Daphine Deutscher, MD  Electronically Signed     MBM/MEDQ  D:  01/01/2008  T:  01/02/2008  Job:  [id number]

## 2011-01-27 NOTE — Op Note (Signed)
NAME:  Jesse Suarez, Jesse Suarez NO.:  [id number]   MEDICAL RECORD NO.:  [id number]          PATIENT TYPE:  INP   LOCATION:  5123                         FACILITY:  [facility]   PHYSICIAN:  Ardeth Sportsman, MD     DATE OF BIRTH:  1961/01/31   DATE OF PROCEDURE:  DATE OF DISCHARGE:                               OPERATIVE REPORT   PRIMARY CARE PHYSICIAN:  Peter A. Patrica Duel, MD   SURGEON:  Ardeth Sportsman, MD   ASSISTANT:  Letha Cape, PA-C   PREOPERATIVE DIAGNOSES:  Acute cholecystitis.   POSTOPERATIVE DIAGNOSES:  Acute cholecystitis.   PROCEDURE PERFORMED:  Laparoscopic cholecystectomy (no intraoperative  cholangiogram).   SPECIMENS:  Gallbladder.   DRAINS:  None.   ESTIMATED BLOOD LOSS:  Less than 20 mL.   COMPLICATIONS:  No major complications.   ANESTHESIA:  1. General anesthesia.  2. Local anesthetic in a field block on all port sites.   INDICATIONS:  Mr. Ching is a 50 year old gentleman with classic  history of biliary colic that has been persistent with the physical exam  and ultrasound concerning for cholecystitis.  His white count is 20,000.   NARRATIVE:  Physiology of the digestive tract and hepatobiliary and  pancreatic function was discussed.  Past physiology colon and  cholecystitis was discussed.  Options were discussed and recognition was  made for laparoscopic cholecystectomy and possible cholangiogram.   The risks, benefits, and alternatives were discussed in detail.  Questions were answered and he agreed to proceed.   OPERATIVE FINDINGS:  He has distended gallbladder but no definite  empyema of the gallbladder yet.  His cystic duct was very stenotic at  its junction with the common bile duct and it was not able to get a  cholangiogram.  There was no evidence of any other intra-abdominal  pathology.   DESCRIPTION OF PROCEDURE:  Informed consent was confirmed.  The patient  was already on IV cefazolin, we added some IV Flagyl.  He had  sequential  compression devices active during the entire case.  He voided just prior  to going to the operating room.  He underwent general anesthesia without  any difficulty.  He was positioned supine with both arms tucked.  His  abdomen was prepped and draped in the sterile fashion.   A 5-mm port was placed in the right upper quadrant using optimal  anterior technique with the patient in the steep reverse Trendelenburg  and right side-up.  Capnoperitoneum __________  15 mmHg to find a good  intra-abdominal insufflation.  Under direct visualization, a 5-mm port  was placed in the right flank and supraumbilically.  A 10-mm port was  placed in the subxiphoid region.   Gallbladder could be seen and could not be grasped because it was so  distended.  A needle aspiration was done to help evacuate some bile and  some small stones.  Gallbladder fundus was grasped and elevated  cephalad.  Peritoneal coverings between the anteromedial and  posterolateral walls of the gallbladder and liver were done.  Circumferential dissection was done to free the proximal third of the  gallbladder off the liver bed and help identify the infundibulum.  Small  lymphatic could be seen anteriorly and this was skeletonized,  cauterized, and removed.  Further skeletization revealed 2 other obvious  structures going from the gallbladder down to the port hepatus  consistent with classic critical view.   A clip of the gallbladder site and 2 clips slightly proximal  on the  cystic artery and this was transected.  Further skeletization was done,  such that there was only one structure remaining going from the  infundibulum, the gallbladder, down the porta cyst with cystic duct.  Two clips were placed on the infundibulum.  A partial cystectomy was  performed.  I immediately got on some small stones and some greenish  bile.  There was no fresh cool bile.  I did milk back couple of small  pea-sized stones, but otherwise  unclear.  A 5-French Cholangio catheter  was placed through a subxiphoid stab incision, flushed and passed into  the cystic duct.  Attempt was made to flush it after clamping but it had  a persistent leak.  After numerous repositioning and trying to milk back  the stones, I could not get the catheter advanced.  Because he did not  have any frank jaundice, I elected to not do cholangiogram.  Therefore,  a cystic duct transsection was completed.  Endoloop, 0-PDS was placed  across the cystic stump to help lasso it tight.  The four clips were  placed just proximal to this at the cystic duct stump.   Gallbladder was freed from its remaining attachments of the liver bed  and placed in an EndoCatch bag, and removed out of subxiphoid port with  general dilation.  The fascial defect with subxiphoid region was  reapproximated using 0 Vicryl using laparoscopic suture passer with good  result.  Copious irrigation over 4 liters were done with clear return.  Hemostasis was ensured on the liver bed.  Capnoperitoneum was evacuated  and ports removed.  A  fascial stitch was tied down in the subxiphoid  region.  Skin was closed using 4-0 Monocryl stitch.  The patient was  extubated and sent to the recovery room in stable condition.  I  explained the operative findings to the patient's son, postoperative  care and instructions were discussed in detail and they expressed  understandings and appreciation.      Ardeth Sportsman, MD  Electronically Signed     SCG/MEDQ  D:  01/02/2008  T:  01/03/2008  Job:  [id number]

## 2011-02-07 ENCOUNTER — Other Ambulatory Visit: Payer: Self-pay | Admitting: Family Medicine

## 2011-03-04 ENCOUNTER — Encounter: Payer: Self-pay | Admitting: *Deleted

## 2011-03-04 ENCOUNTER — Encounter: Payer: Self-pay | Admitting: Internal Medicine

## 2011-03-04 ENCOUNTER — Ambulatory Visit (INDEPENDENT_AMBULATORY_CARE_PROVIDER_SITE_OTHER): Payer: 59 | Admitting: Internal Medicine

## 2011-03-04 VITALS — BP 122/80 | HR 57 | Resp 16 | Ht 69.0 in | Wt 215.0 lb

## 2011-03-04 DIAGNOSIS — I4891 Unspecified atrial fibrillation: Secondary | ICD-10-CM

## 2011-03-04 DIAGNOSIS — I10 Essential (primary) hypertension: Secondary | ICD-10-CM

## 2011-03-04 NOTE — Assessment & Plan Note (Signed)
>>  ASSESSMENT AND PLAN FOR HYPOTHYROIDISM FOLLOWING RADIOIODINE THERAPY WRITTEN ON 03/04/2011  4:12 PM BY Marinus Maw, MD  Clinically, he appears to be euthyroid. He will followup with his primary physician.

## 2011-03-04 NOTE — Assessment & Plan Note (Signed)
He is maintaining sinus rhythm. Continue his current medications.

## 2011-03-04 NOTE — Assessment & Plan Note (Signed)
Today's blood pressure is well controlled. He will continue his current medicines maintain a low-sodium diet.

## 2011-03-04 NOTE — Progress Notes (Signed)
HPI [name] returns today for followup. He is a 50 year old male with a history of hypertension, dyslipidemia, and recent diagnosis of hyperthyroidism associated with atrial fibrillation and a rapid ventricular response. His atrial fibrillation was quite refractory to treatment until his thyroid became under better control. He currently is stable. He denies chest pain, shortness of breath, or peripheral edema. He does not have palpitations. He has been followed by endocrine. Allergies  Allergen Reactions  . Codeine   . Sulfa Drugs Cross Reactors      Current Outpatient Prescriptions  Medication Sig Dispense Refill  . ALPRAZolam (XANAX) 0.5 MG tablet Take 0.5 mg by mouth at bedtime as needed.        Marland Kitchen aspirin 81 MG tablet Take 81 mg by mouth daily.        Marland Kitchen diltiazem (DILACOR XR) 240 MG 24 hr capsule Take 240 mg by mouth daily.        Marland Kitchen FLUoxetine (PROZAC) 20 MG capsule Take 20 mg by mouth 3 (three) times daily.        Marland Kitchen lisinopril-hydrochlorothiazide (PRINZIDE,ZESTORETIC) 10-12.5 MG per tablet Take 1 tablet by mouth daily.        . methimazole (TAPAZOLE) 5 MG tablet Take 5 mg by mouth. Once daily       . metoprolol (TOPROL-XL) 50 MG 24 hr tablet Take 50 mg by mouth daily.        . rivaroxaban (XARELTO) 10 MG TABS tablet Take 10 mg by mouth. Take 2 tablets daily       . simvastatin (ZOCOR) 80 MG tablet Take 80 mg by mouth at bedtime.           Past Medical History  Diagnosis Date  . Hypertension   . Arrhythmia   . Bell palsy   . Depression   . Hypercholesterolemia   . Pneumonia   . Diastolic heart failure     ROS:   All systems reviewed and negative except as noted in the HPI.   Past Surgical History  Procedure Date  . Cholecystectomy   . Knee surgery      Family History  Problem Relation Age of Onset  . Heart disease Mother   . Heart attack Mother      History   Social History  . Marital Status: Married    Spouse Name: N/A    Number of Children: 1  .  Years of Education: N/A   Occupational History  . Not on file.   Social History Main Topics  . Smoking status: Current Everyday Smoker -- 1.5 packs/day  . Smokeless tobacco: Not on file  . Alcohol Use: No  . Drug Use: No  . Sexually Active:    Other Topics Concern  . Not on file   Social History Narrative  . No narrative on file     BP 122/80  Pulse 57  Resp 16  Ht 5\' 9"  (1.753 m)  Wt 215 lb (97.523 kg)  BMI 31.75 kg/m2  Physical Exam:  Well appearing NAD HEENT: Unremarkable Neck:  No JVD, no thyromegally Lymphatics:  No adenopathy Back:  No CVA tenderness Lungs:  Clear HEART:  Regular rate rhythm, no murmurs, no rubs, no clicks Abd:  Flat, positive bowel sounds, no organomegally, no rebound, no guarding Ext:  2 plus pulses, no edema, no cyanosis, no clubbing Skin:  No rashes no nodules Neuro:  CN II through XII intact, motor grossly intact  Assess/Plan:

## 2011-03-04 NOTE — Assessment & Plan Note (Signed)
Today as the patient to reduce his dose of simvastatin 40 mg daily.

## 2011-03-04 NOTE — Patient Instructions (Signed)
Your physician wants you to follow-up in: 6 months with Dr Stevan Born will receive a reminder letter in the mail two months in advance. If you don't receive a letter, please call our office to schedule the follow-up appointment.  Your physician has recommended you make the following change in your medication: decrease Simvastatin to 40mg  daily 1/2 tablet

## 2011-03-04 NOTE — Assessment & Plan Note (Signed)
>>  ASSESSMENT AND PLAN FOR DYSLIPIDEMIA WRITTEN ON 03/04/2011  4:11 PM BY Marinus Maw, MD  Today as the patient to reduce his dose of simvastatin 40 mg daily.

## 2011-03-04 NOTE — Assessment & Plan Note (Signed)
Clinically, he appears to be euthyroid. He will followup with his primary physician.

## 2011-04-02 ENCOUNTER — Other Ambulatory Visit: Payer: Self-pay | Admitting: *Deleted

## 2011-04-02 DIAGNOSIS — I4891 Unspecified atrial fibrillation: Secondary | ICD-10-CM

## 2011-04-02 MED ORDER — RIVAROXABAN 10 MG PO TABS: 10.0000 mg | ORAL_TABLET | Freq: Every day | ORAL | Status: DC

## 2011-06-09 LAB — DIFFERENTIAL
Basophils Absolute: 0
Basophils Relative: 0
Eosinophils Absolute: 0
Eosinophils Relative: 0
Lymphocytes Relative: 9 — ABNORMAL LOW
Lymphs Abs: 1.8
Monocytes Absolute: 1.2 — ABNORMAL HIGH
Monocytes Relative: 6
Neutro Abs: 17.5 — ABNORMAL HIGH
Neutrophils Relative %: 86 — ABNORMAL HIGH

## 2011-06-09 LAB — COMPREHENSIVE METABOLIC PANEL
ALT: 13
ALT: 27
AST: 17
AST: 28
Albumin: 2.7 — ABNORMAL LOW
Albumin: 3.5
Alkaline Phosphatase: 85
Alkaline Phosphatase: 89
BUN: 4 — ABNORMAL LOW
BUN: 7
CO2: 25
CO2: 26
Calcium: 8.4
Calcium: 8.6
Chloride: 103
Chloride: 107
Creatinine, Ser: 0.86
Creatinine, Ser: 0.9
GFR calc Af Amer: >60
GFR calc Af Amer: >60
GFR calc non Af Amer: >60
GFR calc non Af Amer: >60
Glucose, Bld: 111 — ABNORMAL HIGH
Glucose, Bld: 132 — ABNORMAL HIGH
Potassium: 3.5
Potassium: 4
Sodium: 136
Sodium: 139
Total Bilirubin: 0.3
Total Bilirubin: 0.3
Total Protein: 5.3 — ABNORMAL LOW
Total Protein: 6.3

## 2011-06-09 LAB — CBC
HCT: 36 — ABNORMAL LOW
HCT: 43.1
Hemoglobin: 12.6 — ABNORMAL LOW
Hemoglobin: 14.8
MCHC: 34.3
MCHC: 35
MCV: 87
MCV: 87.3
Platelets: 238
Platelets: 285
RBC: 4.13 — ABNORMAL LOW
RBC: 4.96
RDW: 13.8
RDW: 13.8
WBC: 16.7 — ABNORMAL HIGH
WBC: 20.5 — ABNORMAL HIGH

## 2011-06-09 LAB — URINALYSIS, ROUTINE W REFLEX MICROSCOPIC
Glucose, UA: NEGATIVE
Hgb urine dipstick: NEGATIVE
Ketones, ur: 15 — AB
Leukocytes, UA: NEGATIVE
Nitrite: NEGATIVE
Protein, ur: 30 — AB
Specific Gravity, Urine: 1.033 — ABNORMAL HIGH
Urobilinogen, UA: 1
pH: 6

## 2011-06-09 LAB — URINE MICROSCOPIC-ADD ON

## 2011-06-09 LAB — LIPASE, BLOOD: Lipase: 21

## 2011-06-15 ENCOUNTER — Other Ambulatory Visit: Payer: Self-pay | Admitting: Family Medicine

## 2011-06-15 ENCOUNTER — Ambulatory Visit
Admission: RE | Admit: 2011-06-15 | Discharge: 2011-06-15 | Disposition: A | Discharge status: Home / Self Care | Payer: 59 | Source: Ambulatory Visit | Attending: Family Medicine | Admitting: Family Medicine

## 2011-06-15 DIAGNOSIS — R05 Cough: Secondary | ICD-10-CM

## 2011-06-15 DIAGNOSIS — R059 Cough, unspecified: Secondary | ICD-10-CM

## 2011-08-20 ENCOUNTER — Encounter: Payer: Self-pay | Admitting: Internal Medicine

## 2011-08-21 ENCOUNTER — Ambulatory Visit (INDEPENDENT_AMBULATORY_CARE_PROVIDER_SITE_OTHER): Payer: 59 | Admitting: Internal Medicine

## 2011-08-21 ENCOUNTER — Encounter: Payer: Self-pay | Admitting: Internal Medicine

## 2011-08-21 VITALS — BP 118/78 | HR 57 | Ht 69.0 in | Wt 215.8 lb

## 2011-08-21 DIAGNOSIS — I4891 Unspecified atrial fibrillation: Secondary | ICD-10-CM

## 2011-08-21 DIAGNOSIS — I10 Essential (primary) hypertension: Secondary | ICD-10-CM

## 2011-08-21 DIAGNOSIS — E0590 Thyrotoxicosis, unspecified without thyrotoxic crisis or storm: Secondary | ICD-10-CM

## 2011-08-21 NOTE — Assessment & Plan Note (Signed)
His blood pressure is well controlled. He will reduce his meds by weaning off of Diltiazem.

## 2011-08-21 NOTE — Progress Notes (Signed)
HPI Jesse Suarez returns today for followup. He is a 50 yo man with a h/o Persistent atrial fib occurring with thyrotoxicosis. He has been euthyroid for several months and his atrial fib has been quiet. He remains on multiple medications. He denies c/p, sob or palpitations. He appears to be euthyroid. No syncope or peripheral edema. Allergies  Allergen Reactions  . Codeine   . Sulfa Drugs Cross Reactors      Current Outpatient Prescriptions  Medication Sig Dispense Refill  . ALPRAZolam (XANAX) 0.5 MG tablet Take 0.5 mg by mouth at bedtime as needed.        Marland Kitchen aspirin 81 MG tablet Take 81 mg by mouth daily.        Marland Kitchen diltiazem (DILACOR XR) 240 MG 24 hr capsule Take 240 mg by mouth daily.        Marland Kitchen FLUoxetine (PROZAC) 20 MG capsule Take 20 mg by mouth 3 (three) times daily.        Marland Kitchen lisinopril-hydrochlorothiazide (PRINZIDE,ZESTORETIC) 10-12.5 MG per tablet Take 1 tablet by mouth daily.        . methimazole (TAPAZOLE) 5 MG tablet Take 5 mg by mouth. Once daily       . metoprolol (TOPROL-XL) 50 MG 24 hr tablet Take 50 mg by mouth daily.        . rivaroxaban (XARELTO) 10 MG TABS tablet Take 1 tablet (10 mg total) by mouth daily. Take 2 tablets daily  60 tablet  6  . simvastatin (ZOCOR) 80 MG tablet 1/2 tablet daily         Past Medical History  Diagnosis Date  . Hypertension   . Arrhythmia   . Bell palsy   . Depression   . Hypercholesterolemia   . Pneumonia   . Diastolic heart failure     ROS:   All systems reviewed and negative except as noted in the HPI.   Past Surgical History  Procedure Date  . Cholecystectomy   . Knee surgery      Family History  Problem Relation Age of Onset  . Heart disease Mother   . Heart attack Mother   . Cirrhosis Father   . Other Mother     thyroid problems  . Other Father     thyroid problems     History   Social History  . Marital Status: Married    Spouse Name: N/A    Number of Children: 1  . Years of Education: N/A    Occupational History  . Not on file.   Social History Main Topics  . Smoking status: Current Everyday Smoker -- 1.5 packs/day  . Smokeless tobacco: Not on file  . Alcohol Use: No  . Drug Use: No  . Sexually Active:    Other Topics Concern  . Not on file   Social History Narrative  . No narrative on file     BP 118/78  Pulse 57  Ht 5\' 9"  (1.753 m)  Wt 97.886 kg (215 lb 12.8 oz)  BMI 31.87 kg/m2  Physical Exam:  Well appearing middle aged man, NAD HEENT: Unremarkable Neck:  No JVD, no thyromegally Lymphatics:  No adenopathy Back:  No CVA tenderness Lungs:  Clear with no wheezes, rales, or rhonchi HEART:  Regular rate rhythm, no murmurs, no rubs, no clicks Abd:  soft, positive bowel sounds, no organomegally, no rebound, no guarding Ext:  2 plus pulses, no edema, no cyanosis, no clubbing Skin:  No rashes no nodules Neuro:  CN  II through XII intact, motor grossly intact  EKG NSR rightward axis.  Assess/Plan:

## 2011-08-21 NOTE — Patient Instructions (Signed)
Your physician wants you to follow-up in: 6 months You will receive a reminder letter in the mail two months in advance. If you don't receive a letter, please call our office to schedule the follow-up appointment.  WEAN OFF DILACOR TAKE ONE TABLET EVERY OTHER DAY FOR ONE WEEK THEN STOP  Continue other medication as usual

## 2011-08-21 NOTE — Assessment & Plan Note (Signed)
>>  ASSESSMENT AND PLAN FOR HYPOTHYROIDISM FOLLOWING RADIOIODINE THERAPY WRITTEN ON 08/21/2011  4:43 PM BY Marinus Maw, MD  He will continue Methimazole.

## 2011-08-21 NOTE — Assessment & Plan Note (Signed)
He is maintaining NSR. He will wean off of Dilacor. He will continue his other meds.

## 2011-08-21 NOTE — Assessment & Plan Note (Signed)
He will continue Methimazole.

## 2011-09-03 ENCOUNTER — Ambulatory Visit (INDEPENDENT_AMBULATORY_CARE_PROVIDER_SITE_OTHER): Payer: 59

## 2011-09-03 DIAGNOSIS — J019 Acute sinusitis, unspecified: Secondary | ICD-10-CM

## 2011-09-03 DIAGNOSIS — J209 Acute bronchitis, unspecified: Secondary | ICD-10-CM

## 2011-09-03 DIAGNOSIS — H6120 Impacted cerumen, unspecified ear: Secondary | ICD-10-CM

## 2011-10-14 ENCOUNTER — Ambulatory Visit (INDEPENDENT_AMBULATORY_CARE_PROVIDER_SITE_OTHER): Payer: 59 | Admitting: Family Medicine

## 2011-10-14 VITALS — BP 129/86 | HR 60 | Temp 98.0°F | Resp 16 | Ht 69.0 in | Wt 213.0 lb

## 2011-10-14 DIAGNOSIS — J3489 Other specified disorders of nose and nasal sinuses: Secondary | ICD-10-CM

## 2011-10-14 DIAGNOSIS — IMO0001 Reserved for inherently not codable concepts without codable children: Secondary | ICD-10-CM

## 2011-10-14 DIAGNOSIS — R059 Cough, unspecified: Secondary | ICD-10-CM

## 2011-10-14 DIAGNOSIS — R05 Cough: Secondary | ICD-10-CM

## 2011-10-14 MED ORDER — LEVOFLOXACIN 500 MG PO TABS: 500.0000 mg | ORAL_TABLET | Freq: Every day | ORAL | Status: AC

## 2011-10-14 MED ORDER — HYDROCOD POLST-CHLORPHEN POLST 10-8 MG/5ML PO LQCR: 5.0000 mL | Freq: Two times a day (BID) | ORAL | Status: DC

## 2011-10-14 NOTE — Progress Notes (Signed)
  Subjective:    Patient ID: Jesse Suarez, male    DOB: May 08, 1961, 51 y.o.   MRN: [id number]  Sinusitis This is a new problem. The current episode started in the past 7 days. The problem has been gradually worsening since onset. There has been no fever. He is experiencing no pain. Associated symptoms include chills, congestion, coughing, diaphoresis, headaches, sinus pressure and a sore throat (scratchy). Past treatments include acetaminophen and oral decongestants. The treatment provided no relief.      Review of Systems  Constitutional: Positive for chills and diaphoresis.  HENT: Positive for congestion, sore throat (scratchy) and sinus pressure.   Respiratory: Positive for cough.   Gastrointestinal: Negative.   Neurological: Positive for headaches.       Objective:   Physical Exam  Constitutional: He is oriented to person, place, and time. He appears well-developed and well-nourished.  HENT:  Head: Normocephalic and atraumatic.  Right Ear: External ear normal.  Left Ear: External ear normal.  Mouth/Throat: Oropharynx is clear and moist. Oropharyngeal exudate: significant nasal congestion.  Neck: Normal range of motion. Neck supple. No thyromegaly present.  Pulmonary/Chest: He is in respiratory distress. He has no wheezes. He has rales (right lower lobe).  Musculoskeletal: Normal range of motion.  Lymphadenopathy:    He has no cervical adenopathy.  Neurological: He is alert and oriented to person, place, and time.  Skin: Skin is warm and dry.          Assessment & Plan:

## 2011-10-14 NOTE — Patient Instructions (Signed)
Sinusitis Sinuses are air pockets within the bones of your face. The growth of bacteria within a sinus leads to infection. The infection prevents the sinuses from draining. This infection is called sinusitis. SYMPTOMS  There will be different areas of pain depending on which sinuses have become infected.  The maxillary sinuses often produce pain beneath the eyes.   Frontal sinusitis may cause pain in the middle of the forehead and above the eyes.  Other problems (symptoms) include:  Toothaches.   Colored, pus-like (purulent) drainage from the nose.   Swelling, warmth, and tenderness over the sinus areas may be signs of infection.  TREATMENT  Sinusitis is most often determined by an exam.X-rays may be taken. If x-rays have been taken, make sure you obtain your results or find out how you are to obtain them. Your caregiver may give you medications (antibiotics). These are medications that will help kill the bacteria causing the infection. You may also be given a medication (decongestant) that helps to reduce sinus swelling.  HOME CARE INSTRUCTIONS   Only take over-the-counter or prescription medicines for pain, discomfort, or fever as directed by your caregiver.   Drink extra fluids. Fluids help thin the mucus so your sinuses can drain more easily.   Applying either moist heat or ice packs to the sinus areas may help relieve discomfort.   Use saline nasal sprays to help moisten your sinuses. The sprays can be found at your local drugstore.  SEEK IMMEDIATE MEDICAL CARE IF:  You have a fever.   You have increasing pain, severe headaches, or toothache.   You have nausea, vomiting, or drowsiness.   You develop unusual swelling around the face or trouble seeing.  MAKE SURE YOU:   Understand these instructions.   Will watch your condition.   Will get help right away if you are not doing well or get worse.  Document Released: 08/31/2005 Document Revised: 05/13/2011 Document Reviewed:  03/30/2007 [facility] Patient Information 2012 [place], [place].Cough, Adult  A cough is a reflex that helps clear your throat and airways. It can help heal the body or may be a reaction to an irritated airway. A cough may only last 2 or 3 weeks (acute) or may last more than 8 weeks (chronic).  CAUSES Acute cough:  Viral or bacterial infections.  Chronic cough:  Infections.   Allergies.   Asthma.   Post-nasal drip.   Smoking.   Heartburn or acid reflux.   Some medicines.   Chronic lung problems (COPD).   Cancer.  SYMPTOMS   Cough.   Fever.   Chest pain.   Increased breathing rate.   High-pitched whistling sound when breathing (wheezing).   Colored mucus that you cough up (sputum).  TREATMENT   A bacterial cough may be treated with antibiotic medicine.   A viral cough must run its course and will not respond to antibiotics.   Your caregiver may recommend other treatments if you have a chronic cough.  HOME CARE INSTRUCTIONS   Only take over-the-counter or prescription medicines for pain, discomfort, or fever as directed by your caregiver. Use cough suppressants only as directed by your caregiver.   Use a cold steam vaporizer or humidifier in your bedroom or home to help loosen secretions.   Sleep in a semi-upright position if your cough is worse at night.   Rest as needed.   Stop smoking if you smoke.  SEEK IMMEDIATE MEDICAL CARE IF:   You have pus in your sputum.   Your  cough starts to worsen.   You cannot control your cough with suppressants and are losing sleep.   You begin coughing up blood.   You have difficulty breathing.   You develop pain which is getting worse or is uncontrolled with medicine.   You have a fever.  MAKE SURE YOU:   Understand these instructions.   Will watch your condition.   Will get help right away if you are not doing well or get worse.  Document Released: 02/27/2011 Document Revised: 05/13/2011 Document Reviewed:  02/27/2011 [facility] Patient Information 2012 [place], [place].

## 2011-10-19 ENCOUNTER — Ambulatory Visit: Payer: 59

## 2011-11-04 ENCOUNTER — Other Ambulatory Visit: Payer: Self-pay | Admitting: Internal Medicine

## 2011-11-05 NOTE — Telephone Encounter (Signed)
..   Requested Prescriptions   Pending Prescriptions Disp Refills  . XARELTO 10 MG TABS tablet [Pharmacy Med Name: XARELTO 10 MG TABLET] 60 tablet 4    Sig: TAKE 2 TABLETS BY MOUTH EVERY DAY

## 2012-01-03 ENCOUNTER — Ambulatory Visit (INDEPENDENT_AMBULATORY_CARE_PROVIDER_SITE_OTHER): Payer: 59 | Admitting: Emergency Medicine

## 2012-01-03 VITALS — BP 107/70 | HR 70 | Temp 98.0°F | Resp 16 | Ht 69.0 in | Wt 211.2 lb

## 2012-01-03 DIAGNOSIS — J329 Chronic sinusitis, unspecified: Secondary | ICD-10-CM

## 2012-01-03 DIAGNOSIS — J40 Bronchitis, not specified as acute or chronic: Secondary | ICD-10-CM

## 2012-01-03 DIAGNOSIS — R05 Cough: Secondary | ICD-10-CM

## 2012-01-03 DIAGNOSIS — R059 Cough, unspecified: Secondary | ICD-10-CM

## 2012-01-03 MED ORDER — FLUTICASONE PROPIONATE 50 MCG/ACT NA SUSP: 2.0000 | Freq: Every day | NASAL | Status: DC

## 2012-01-03 MED ORDER — AMOXICILLIN-POT CLAVULANATE 875-125 MG PO TABS: 1.0000 | ORAL_TABLET | Freq: Two times a day (BID) | ORAL | Status: AC

## 2012-01-03 NOTE — Progress Notes (Signed)
  Subjective:    Patient ID: Jesse Simmer [name], male    DOB: March 26, 1961, 51 y.o.   MRN: [id number]  HPI patient enters with head congestion sore throat. On nasal drainage and productive cough. He is on Zyrtec one a day he has a history of allergies this time he appear    Review of Systems currently doing well with regard to his atrial fibrillation. No recent problems     Objective:   Physical Exam TMs are dull. Nose is congested. Throat is clear. Chest is clear to auscultation and percussion        Assessment & Plan:  Assessment is acute sinusitis bronchitis. Treat with Augmentin Flonase and  over-the-counter Allegra

## 2012-01-05 ENCOUNTER — Other Ambulatory Visit: Payer: Self-pay | Admitting: Internal Medicine

## 2012-01-18 ENCOUNTER — Other Ambulatory Visit: Payer: Self-pay | Admitting: Internal Medicine

## 2012-01-19 ENCOUNTER — Other Ambulatory Visit: Payer: Self-pay | Admitting: *Deleted

## 2012-01-19 DIAGNOSIS — I4891 Unspecified atrial fibrillation: Secondary | ICD-10-CM

## 2012-01-19 MED ORDER — ALPRAZOLAM 0.5 MG PO TABS: 0.5000 mg | ORAL_TABLET | Freq: Three times a day (TID) | ORAL | Status: DC | PRN

## 2012-02-11 ENCOUNTER — Encounter: Payer: Self-pay | Admitting: Internal Medicine

## 2012-02-11 ENCOUNTER — Ambulatory Visit (INDEPENDENT_AMBULATORY_CARE_PROVIDER_SITE_OTHER): Payer: 59 | Admitting: Internal Medicine

## 2012-02-11 VITALS — BP 120/80 | HR 71 | Ht 69.0 in | Wt 213.1 lb

## 2012-02-11 DIAGNOSIS — I4891 Unspecified atrial fibrillation: Secondary | ICD-10-CM

## 2012-02-11 DIAGNOSIS — I10 Essential (primary) hypertension: Secondary | ICD-10-CM

## 2012-02-11 NOTE — Assessment & Plan Note (Signed)
He has had no recurrence since his thyroid has been controlled. I have discussed the treatment options with the patient and I have recommended he continue his beta blocker but that he be allowed to stop his Xarelto. If he has recurrent symptoms, I have instructed the patient to call us and would consider restarting his anti-coagulation.

## 2012-02-11 NOTE — Assessment & Plan Note (Addendum)
His blood pressure is well controlled. He will continue his current meds and maintain a low sodium diet. 

## 2012-02-11 NOTE — Patient Instructions (Signed)
Your physician wants you to follow-up in: 12 months with Dr. Taylor. You will receive a reminder letter in the mail two months in advance. If you don't receive a letter, please call our office to schedule the follow-up appointment.    

## 2012-02-11 NOTE — Progress Notes (Signed)
HPI Jesse Suarez returns today for followup. He is a pleasant 51 yo man with a h/o atrial fibrillation related to thyrotoxicosis. He has been euthyroid clinically for several months and has had no recurrent atrial fib. He denies chest pain, sob, or palpitations. His weight has remained stable. He still has not decided on whether to proceed with iodine ablation of his thyroid. No other complaints. Allergies  Allergen Reactions  . Codeine   . Sulfa Drugs Cross Reactors        Past Medical History  Diagnosis Date  . Hypertension   . Arrhythmia   . Bell palsy   . Depression   . Hypercholesterolemia   . Pneumonia   . Diastolic heart failure     ROS:   All systems reviewed and negative except as noted in the HPI.   Past Surgical History  Procedure Date  . Cholecystectomy   . Knee surgery      Family History  Problem Relation Age of Onset  . Heart disease Mother   . Heart attack Mother   . Cirrhosis Father   . Other Mother     thyroid problems  . Other Father     thyroid problems     History   Social History  . Marital Status: Married    Spouse Name: N/A    Number of Children: 1  . Years of Education: N/A   Occupational History  . Not on file.   Social History Main Topics  . Smoking status: Current Everyday Smoker -- 1.5 packs/day  . Smokeless tobacco: Not on file  . Alcohol Use: No  . Drug Use: No  . Sexually Active: Not on file   Other Topics Concern  . Not on file   Social History Narrative  . No narrative on file     BP 120/80  Pulse 71  Ht 5\' 9"  (1.753 m)  Wt 213 lb 1.9 oz (96.671 kg)  BMI 31.47 kg/m2  Physical Exam:  Well appearing middle aged man, NAD HEENT: Unremarkable except for subtle exopthalmus. Neck:  No JVD, no thyromegally Lymphatics:  No adenopathy Back:  No CVA tenderness Lungs:  Clear HEART:  Regular rate rhythm, no murmurs, no rubs, no clicks Abd:  soft, positive bowel sounds, no organomegally, no rebound, no  guarding Ext:  2 plus pulses, no edema, no cyanosis, no clubbing Skin:  No rashes no nodules Neuro:  CN II through XII intact, motor grossly intact  EKG NSR Assess/Plan:

## 2012-02-12 ENCOUNTER — Other Ambulatory Visit: Payer: Self-pay | Admitting: Internal Medicine

## 2012-02-16 ENCOUNTER — Other Ambulatory Visit: Payer: Self-pay | Admitting: *Deleted

## 2012-02-16 ENCOUNTER — Encounter: Payer: Self-pay | Admitting: Emergency Medicine

## 2012-02-16 DIAGNOSIS — I4891 Unspecified atrial fibrillation: Secondary | ICD-10-CM

## 2012-02-16 MED ORDER — ALPRAZOLAM 0.5 MG PO TABS: 0.5000 mg | ORAL_TABLET | Freq: Three times a day (TID) | ORAL | Status: DC | PRN

## 2012-02-20 ENCOUNTER — Ambulatory Visit (INDEPENDENT_AMBULATORY_CARE_PROVIDER_SITE_OTHER): Payer: 59 | Admitting: Internal Medicine

## 2012-02-20 VITALS — BP 118/74 | HR 73 | Temp 98.0°F | Resp 16 | Ht 69.5 in | Wt 214.4 lb

## 2012-02-20 DIAGNOSIS — R059 Cough, unspecified: Secondary | ICD-10-CM

## 2012-02-20 DIAGNOSIS — F419 Anxiety disorder, unspecified: Secondary | ICD-10-CM

## 2012-02-20 DIAGNOSIS — R05 Cough: Secondary | ICD-10-CM

## 2012-02-20 DIAGNOSIS — F411 Generalized anxiety disorder: Secondary | ICD-10-CM

## 2012-02-20 DIAGNOSIS — F418 Other specified anxiety disorders: Secondary | ICD-10-CM | POA: Insufficient documentation

## 2012-02-20 DIAGNOSIS — S98139A Complete traumatic amputation of one unspecified lesser toe, initial encounter: Secondary | ICD-10-CM | POA: Insufficient documentation

## 2012-02-20 DIAGNOSIS — J329 Chronic sinusitis, unspecified: Secondary | ICD-10-CM

## 2012-02-20 DIAGNOSIS — I4891 Unspecified atrial fibrillation: Secondary | ICD-10-CM

## 2012-02-20 DIAGNOSIS — N529 Male erectile dysfunction, unspecified: Secondary | ICD-10-CM | POA: Insufficient documentation

## 2012-02-20 DIAGNOSIS — Z8669 Personal history of other diseases of the nervous system and sense organs: Secondary | ICD-10-CM | POA: Insufficient documentation

## 2012-02-20 MED ORDER — ALPRAZOLAM 0.5 MG PO TABS: 0.5000 mg | ORAL_TABLET | Freq: Three times a day (TID) | ORAL | Status: DC | PRN

## 2012-02-20 MED ORDER — ALPRAZOLAM 0.5 MG PO TABS: 0.5000 mg | ORAL_TABLET | Freq: Three times a day (TID) | ORAL | Status: AC | PRN

## 2012-02-20 MED ORDER — HYDROCODONE-HOMATROPINE 5-1.5 MG/5ML PO SYRP: 5.0000 mL | ORAL_SOLUTION | Freq: Three times a day (TID) | ORAL | Status: AC | PRN

## 2012-02-20 MED ORDER — AMOXICILLIN-POT CLAVULANATE 875-125 MG PO TABS: 1.0000 | ORAL_TABLET | Freq: Two times a day (BID) | ORAL | Status: AC

## 2012-02-22 NOTE — Progress Notes (Signed)
  Subjective:    Patient ID: Jesse Simmer [name], male    DOB: [birth year], 51 y.o.   MRN: [id number]  HPIHere for one acute problem that is nasal congestion with headache and purulent discharge for the past 2 weeks. He is struggling with allergy symptoms for the past month. He has a history recurrent sinusitis. He was last treated in February for sinusitis. His cough is nonproductive but keeps him awake at night  He is also here for followup of Depression/anxiety and med refills He describes a lot of turmoil at home with his wife's relatives who are all drug abusers He continues to work at [employer] and smoke way too much/unable to commit to quitting    Review of SystemsHe is on medication for hypertension and hyperlipidemia and is not time for his routine lab work  He has no cardiac symptoms or respiratory symptoms other than the present illness     Objective:   Physical Exam TMs are clear Nares congested with purulent mucus Tender maxillary sinus areas bilaterally Throat is clear No a.c. Nodes Chest has scattered rhonchi Heart regular without murmur His mood is quite good/affect is appropriate      Assessment & Plan:   1. Sinusitis  amoxicillin-clavulanate (AUGMENTIN) 875-125 MG per tablet  2. Atrial fibrillation     Resolved/ off Meds  3. Cough  HYDROcodone-homatropine (HYCODAN) 5-1.5 MG/5ML syrup  4. Anxiety  ALPRAZolam (XANAX) 0.5 MG tablet    Meds ordered this encounter  Medications   He needs to quit smoking                  . amoxicillin-clavulanate (AUGMENTIN) 875-125 MG per tablet    Sig: Take 1 tablet by mouth 2 (two) times daily.    Dispense:  20 tablet    Refill:  0  . HYDROcodone-homatropine (HYCODAN) 5-1.5 MG/5ML syrup    Sig: Take 5 mLs by mouth every 8 (eight) hours as needed for cough.    Dispense:  120 mL    Refill:  0  . ALPRAZolam (XANAX) 0.5 MG tablet    Sig: Take 1 tablet (0.5 mg total) by mouth 3 (three) times daily as needed.   Dispense:  90 tablet    Refill:  5    DO NOT FILL UNTIL 03/20/2012   Followup in 3 months for labs

## 2012-04-04 ENCOUNTER — Other Ambulatory Visit: Payer: Self-pay | Admitting: Internal Medicine

## 2012-04-05 NOTE — Telephone Encounter (Signed)
Need chart to nurses station. 

## 2012-04-15 ENCOUNTER — Other Ambulatory Visit: Payer: Self-pay | Admitting: Endocrinology

## 2012-04-15 DIAGNOSIS — E0590 Thyrotoxicosis, unspecified without thyrotoxic crisis or storm: Secondary | ICD-10-CM

## 2012-04-16 ENCOUNTER — Other Ambulatory Visit: Payer: Self-pay | Admitting: Physician Assistant

## 2012-04-17 ENCOUNTER — Ambulatory Visit (INDEPENDENT_AMBULATORY_CARE_PROVIDER_SITE_OTHER): Payer: 59 | Admitting: Internal Medicine

## 2012-04-17 VITALS — BP 116/77 | HR 59 | Temp 98.2°F | Resp 18 | Ht 69.5 in | Wt 217.0 lb

## 2012-04-17 DIAGNOSIS — N529 Male erectile dysfunction, unspecified: Secondary | ICD-10-CM

## 2012-04-17 DIAGNOSIS — E0590 Thyrotoxicosis, unspecified without thyrotoxic crisis or storm: Secondary | ICD-10-CM

## 2012-04-17 DIAGNOSIS — E785 Hyperlipidemia, unspecified: Secondary | ICD-10-CM

## 2012-04-17 DIAGNOSIS — I10 Essential (primary) hypertension: Secondary | ICD-10-CM

## 2012-04-17 DIAGNOSIS — F341 Dysthymic disorder: Secondary | ICD-10-CM

## 2012-04-17 DIAGNOSIS — F418 Other specified anxiety disorders: Secondary | ICD-10-CM

## 2012-04-17 MED ORDER — SILDENAFIL CITRATE 100 MG PO TABS: 100.0000 mg | ORAL_TABLET | ORAL | Status: DC | PRN

## 2012-04-17 MED ORDER — FLUOXETINE HCL 20 MG PO CAPS: 60.0000 mg | ORAL_CAPSULE | Freq: Every day | ORAL | Status: DC

## 2012-04-17 MED ORDER — SIMVASTATIN 40 MG PO TABS: 40.0000 mg | ORAL_TABLET | Freq: Every day | ORAL | Status: DC

## 2012-04-17 MED ORDER — LISINOPRIL-HYDROCHLOROTHIAZIDE 10-12.5 MG PO TABS: 1.0000 | ORAL_TABLET | Freq: Every day | ORAL | Status: DC

## 2012-04-17 NOTE — Progress Notes (Signed)
  Subjective:    Patient ID: Jesse Simmer [name], male    DOB: [birth year], 51 y.o.   MRN: [id number]  HPIFollowup for refill of medicines Lab work last week at cardiology office was good/he was told to decrease simvastatin to 40 mg  Patient Active Problem List  Diagnosis  . Atrial fibrillation-No recent episodes  . Hyperthyroidism-Dr. Lucianne Muss is planning for I-131 ablation because oral medications are not controlling his problem  . Hypertension-Stable  . Dyslipidemia-Stable  . Depression with anxiety-Doing well at 60 mg Prozac daily plus benzodiazepines if needed for anxiety  . ED (erectile dysfunction)--Was upset that he only got 7 Viagra-Insurance requirement per month             Review of Systems No chest pain/dyspnea on exertion/paroxysmal nocturnal dyspnea/palpitations GI negative GU negative except for erectile dysfunction no skin disorder     Objective:   Physical Exam Filed Vitals:   04/17/12 1220  BP: 116/77  Pulse: 59  Temp: 98.2 F (36.8 C)  Resp: 18   HEENT clear Heart rate regular Lungs clear Psychiatric contact      Assessment & Plan:  Problem #1 depression with anxiety Problem #2 hypertension Problem #3 hyperlipidemia Problem #4 erectile dysfunction Problem #5 hyperthyroidism induced atrial fibrillation  Meds ordered this encounter  Medications  . metoprolol succinate (TOPROL-XL) 25 MG 24 hr tablet    Sig: Take 25 mg by mouth daily.  Marland Kitchen FLUoxetine (PROZAC) 20 MG capsule    Sig: Take 3 capsules (60 mg total) by mouth daily.    Dispense:  270 capsule    Refill:  3  . sildenafil (VIAGRA) 100 MG tablet    Sig: Take 1 tablet (100 mg total) by mouth as needed for erectile dysfunction.    Dispense:  10 tablet    Refill:  5    Needs ov before more refills  . simvastatin (ZOCOR) 40 MG tablet    Sig: Take 1 tablet (40 mg total) by mouth at bedtime.    Dispense:  90 tablet    Refill:  1  . lisinopril-hydrochlorothiazide (PRINZIDE,ZESTORETIC)  10-12.5 MG per tablet    Sig: Take 1 tablet by mouth daily.    Dispense:  90 tablet    Refill:  1    NEEDS OFFICE VISIT   6 months f/u

## 2012-05-18 ENCOUNTER — Encounter (HOSPITAL_COMMUNITY)
Admission: RE | Admit: 2012-05-18 | Discharge: 2012-05-18 | Disposition: A | Discharge status: Home / Self Care | Payer: 59 | Source: Ambulatory Visit | Attending: Endocrinology | Admitting: Endocrinology

## 2012-05-18 DIAGNOSIS — E0590 Thyrotoxicosis, unspecified without thyrotoxic crisis or storm: Secondary | ICD-10-CM

## 2012-05-19 ENCOUNTER — Ambulatory Visit (HOSPITAL_COMMUNITY)
Admission: RE | Admit: 2012-05-19 | Discharge: 2012-05-19 | Disposition: A | Discharge status: Home / Self Care | Payer: 59 | Source: Ambulatory Visit | Attending: Endocrinology | Admitting: Endocrinology

## 2012-05-19 DIAGNOSIS — E0590 Thyrotoxicosis, unspecified without thyrotoxic crisis or storm: Secondary | ICD-10-CM | POA: Insufficient documentation

## 2012-05-19 MED ORDER — SODIUM IODIDE I 131 CAPSULE
17.0000 | Freq: Once | INTRAVENOUS | Status: AC | PRN
  Administered 2012-05-18: 17 via ORAL

## 2012-05-23 ENCOUNTER — Other Ambulatory Visit: Payer: Self-pay | Admitting: Endocrinology

## 2012-05-23 DIAGNOSIS — E0590 Thyrotoxicosis, unspecified without thyrotoxic crisis or storm: Secondary | ICD-10-CM

## 2012-06-03 ENCOUNTER — Encounter (HOSPITAL_COMMUNITY)
Admission: RE | Admit: 2012-06-03 | Discharge: 2012-06-03 | Disposition: A | Discharge status: Home / Self Care | Payer: 59 | Source: Ambulatory Visit | Attending: Endocrinology | Admitting: Endocrinology

## 2012-06-03 DIAGNOSIS — E0590 Thyrotoxicosis, unspecified without thyrotoxic crisis or storm: Secondary | ICD-10-CM | POA: Insufficient documentation

## 2012-06-03 MED ORDER — SODIUM IODIDE I 131 CAPSULE
15.8000 | Freq: Once | INTRAVENOUS | Status: AC | PRN
  Administered 2012-06-03: 15.8 via ORAL

## 2012-07-11 ENCOUNTER — Encounter: Payer: Self-pay | Admitting: Family Medicine

## 2012-07-11 DIAGNOSIS — E0590 Thyrotoxicosis, unspecified without thyrotoxic crisis or storm: Secondary | ICD-10-CM

## 2012-07-20 ENCOUNTER — Other Ambulatory Visit: Payer: Self-pay | Admitting: Physician Assistant

## 2012-07-28 ENCOUNTER — Ambulatory Visit (INDEPENDENT_AMBULATORY_CARE_PROVIDER_SITE_OTHER): Payer: 59 | Admitting: Family Medicine

## 2012-07-28 VITALS — BP 118/75 | HR 82 | Temp 99.0°F | Resp 17 | Ht 69.0 in | Wt 205.0 lb

## 2012-07-28 DIAGNOSIS — J40 Bronchitis, not specified as acute or chronic: Secondary | ICD-10-CM

## 2012-07-28 DIAGNOSIS — R059 Cough, unspecified: Secondary | ICD-10-CM

## 2012-07-28 DIAGNOSIS — R05 Cough: Secondary | ICD-10-CM

## 2012-07-28 MED ORDER — HYDROCOD POLST-CHLORPHEN POLST 10-8 MG/5ML PO LQCR: 5.0000 mL | Freq: Two times a day (BID) | ORAL | Status: DC | PRN

## 2012-07-28 MED ORDER — CEFDINIR 300 MG PO CAPS: 300.0000 mg | ORAL_CAPSULE | Freq: Two times a day (BID) | ORAL | Status: DC

## 2012-07-28 NOTE — Patient Instructions (Addendum)
Let me know if you are not better in the next few days- Sooner if worse.    

## 2012-07-28 NOTE — Progress Notes (Signed)
[facility] [address], [place] [address] [phone]  Date:  07/28/2012   Name:  Jesse HOTTEL [name]   DOB:  1960/09/21   MRN:  [id number]  PCP:  Tonye Pearson, MD    Chief Complaint: Headache, Nasal Congestion, Cough and Sore Throat   History of Present Illness:  Jesse SAMPEDRO [name] is a 51 y.o. very pleasant male patient who presents with the following:  He is here today with illness.  He has noted a cough, green nasal discharge, and some blood from his nose.  Coughing up green and brown mucus as well.  He has noted symptoms for 4 or 5 days.   He has noted chills, but is not sure if he had a fever.   No GI symtpoms.  He does have a scratchy throat and stuffy ears.  He has tried mucinex and some OTC cough syrup.   He has a history of A fib due to thyrotoxicity in the past- he had a thyroid ablation and is now doing ok.  He received tussionex earlier this year and did very well with it- he has a history of feeling very jittery with codeine but no rash/ hives/ etc.  He did not have these symptoms with tussionex and would like to have some of this for his cough   Patient Active Problem List  Diagnosis  . Atrial fibrillation  . Hyperthyroidism  . Hypertension  . Dyslipidemia  . Depression with anxiety  . ED (erectile dysfunction)  . Hx of Bell's palsy  . Amputated toe    Past Medical History  Diagnosis Date  . Hypertension   . Arrhythmia   . Bell palsy   . Depression   . Hypercholesterolemia   . Pneumonia   . Diastolic heart failure   . Anxiety   . Thyroid disease     Past Surgical History  Procedure Date  . Cholecystectomy   . Knee surgery   . Hernia repair   . Vasectomy     History  Substance Use Topics  . Smoking status: Current Every Day Smoker -- 1.0 packs/day for 30 years    Types: Cigarettes  . Smokeless tobacco: Not on file  . Alcohol Use: No    Family History  Problem Relation Age of Onset  . Heart disease  Mother   . Heart attack Mother   . Other Mother     thyroid problems  . Cirrhosis Father   . Other Father     thyroid problems  . Mental illness Brother     Allergies  Allergen Reactions  . Codeine   . Sulfa Drugs Cross Reactors     Medication list has been reviewed and updated.  Current Outpatient Prescriptions on File Prior to Visit  Medication Sig Dispense Refill  . aspirin 81 MG tablet Take 81 mg by mouth daily.        Marland Kitchen FLUoxetine (PROZAC) 20 MG capsule Take 3 capsules (60 mg total) by mouth daily.  270 capsule  3  . FLUoxetine (PROZAC) 20 MG capsule TAKE 3 CAPSULES BY MOUTH EVERY DAY  270 capsule  0  . lisinopril-hydrochlorothiazide (PRINZIDE,ZESTORETIC) 10-12.5 MG per tablet Take 1 tablet by mouth daily.  90 tablet  1  . metoprolol succinate (TOPROL-XL) 25 MG 24 hr tablet Take 25 mg by mouth daily.      . sildenafil (VIAGRA) 100 MG tablet Take 1 tablet (100 mg total) by mouth as needed  for erectile dysfunction.  10 tablet  5  . simvastatin (ZOCOR) 40 MG tablet Take 1 tablet (40 mg total) by mouth at bedtime.  90 tablet  1  . fluticasone (FLONASE) 50 MCG/ACT nasal spray Place 2 sprays into the nose daily.  16 g  6    Review of Systems:  As per HPI- otherwise negative.   Physical Examination: Filed Vitals:   07/28/12 1702  BP: 118/75  Pulse: 82  Temp: 99 F (37.2 C)  Resp: 17   Filed Vitals:   07/28/12 1702  Height: 5\' 9"  (1.753 m)  Weight: 205 lb (92.987 kg)   Body mass index is 30.27 kg/(m^2). Ideal Body Weight: Weight in (lb) to have BMI = 25: 168.9   GEN: WDWN, NAD, Non-toxic, A & O x 3, overweight HEENT: Atraumatic, Normocephalic. Neck supple. No masses, No LAD. Bilateral TM wnl, oropharynx normal.  PEERL,EOMI.   Ears and Nose: No external deformity. CV: RRR, No M/G/R. No JVD. No thrill. No extra heart sounds. PULM: CTA B, no wheezes, crackles, rhonchi. No retractions. No resp. distress. No accessory muscle use EXTR: No c/c/e NEURO Normal gait.    PSYCH: Normally interactive. Conversant. Not depressed or anxious appearing.  Calm demeanor.    Assessment and Plan: 1. Bronchitis  cefdinir (OMNICEF) 300 MG capsule  2. Cough  chlorpheniramine-HYDROcodone (TUSSIONEX PENNKINETIC ER) 10-8 MG/5ML LQCR  treat for bronchitis as above.  He may use tussionex as needed for cough See patient instructions for more details.     Abbe Amsterdam, MD

## 2012-07-30 ENCOUNTER — Telehealth: Payer: Self-pay

## 2012-07-30 DIAGNOSIS — J40 Bronchitis, not specified as acute or chronic: Secondary | ICD-10-CM

## 2012-07-30 MED ORDER — AMOXICILLIN 875 MG PO TABS: 875.0000 mg | ORAL_TABLET | Freq: Two times a day (BID) | ORAL | Status: DC

## 2012-07-30 MED ORDER — CEFDINIR 300 MG PO CAPS: 300.0000 mg | ORAL_CAPSULE | Freq: Two times a day (BID) | ORAL | Status: DC

## 2012-07-30 NOTE — Telephone Encounter (Signed)
PT STATES HIS ANTIBIOTIC WAS DROPPED OUT OF HIS CAR AND WOULD LIKE TO SEE IF DR CAN DO RX AGAIN. [phone]

## 2012-07-30 NOTE — Telephone Encounter (Signed)
Spoke with wife, advised Rx at pharmacy.

## 2012-07-30 NOTE — Telephone Encounter (Signed)
Spoke with his wife she needs a different Rx not the same one because the insurance  Will not pay. Can we send them in something cheaper? Please advise

## 2012-07-30 NOTE — Telephone Encounter (Signed)
Rx for amoxicillin sent to pharmacy.

## 2012-09-14 ENCOUNTER — Other Ambulatory Visit: Payer: Self-pay | Admitting: Internal Medicine

## 2012-09-14 ENCOUNTER — Other Ambulatory Visit: Payer: Self-pay | Admitting: Radiology

## 2012-09-14 NOTE — Telephone Encounter (Signed)
Forward to Dr. Doolittle 

## 2012-10-09 ENCOUNTER — Other Ambulatory Visit: Payer: Self-pay | Admitting: Internal Medicine

## 2012-10-21 ENCOUNTER — Other Ambulatory Visit: Payer: Self-pay | Admitting: Physician Assistant

## 2012-12-04 ENCOUNTER — Other Ambulatory Visit: Payer: Self-pay | Admitting: Internal Medicine

## 2012-12-07 ENCOUNTER — Other Ambulatory Visit: Payer: Self-pay | Admitting: Emergency Medicine

## 2013-01-02 ENCOUNTER — Other Ambulatory Visit: Payer: Self-pay | Admitting: Physician Assistant

## 2013-01-18 ENCOUNTER — Ambulatory Visit (INDEPENDENT_AMBULATORY_CARE_PROVIDER_SITE_OTHER): Payer: 59 | Admitting: Family Medicine

## 2013-01-18 VITALS — BP 112/78 | HR 88 | Temp 98.2°F | Resp 16 | Ht 69.0 in | Wt 201.0 lb

## 2013-01-18 DIAGNOSIS — I10 Essential (primary) hypertension: Secondary | ICD-10-CM

## 2013-01-18 DIAGNOSIS — F411 Generalized anxiety disorder: Secondary | ICD-10-CM

## 2013-01-18 DIAGNOSIS — E785 Hyperlipidemia, unspecified: Secondary | ICD-10-CM

## 2013-01-18 NOTE — Progress Notes (Signed)
Is a 52 year old worker at [employer] comes in for followup of the pressure, anxiety, erectile dysfunction, hyperlipidemia, and insomnia. He's having his thyroid monitored and adjusted by Dr. Drinda Butts. He's not having a good time working and is thought about retiring 2 years looking for something else  Patient's main complaint at this point is the insomnia with difficulty falling asleep. He's having no shortness of breath, chest pain, leg swelling.  Objective: Alert, no acute distress HEENT: Unremarkable Chest: Clear Heart: Regular no murmur Abdomen: Soft nontender without HSM Skin: No rashes  Assessment: Multiple issues probably related to mild depression and anxiety.  Plan: Switch from metoprolol to amlodipine 10 mg daily Refill lisinopril HCT, simvastatin, alprazolam, Prozac, and Viagra  Trial of Ambien 10 mg when necessary at bedtime  Signed, Elvina Sidle in the

## 2013-01-18 NOTE — Patient Instructions (Addendum)

## 2013-01-19 LAB — LIPID PANEL
Cholesterol: 140 mg/dL (ref 0–200)
HDL: 38 mg/dL — ABNORMAL LOW (ref >39)
LDL Cholesterol: 47 mg/dL (ref 0–99)
Total CHOL/HDL Ratio: 3.7 Ratio
Triglycerides: 277 mg/dL — ABNORMAL HIGH (ref <150)
VLDL: 55 mg/dL — ABNORMAL HIGH (ref 0–40)

## 2013-01-19 LAB — COMPREHENSIVE METABOLIC PANEL
ALT: 14 U/L (ref 0–53)
AST: 11 U/L (ref 0–37)
Albumin: 4.5 g/dL (ref 3.5–5.2)
Alkaline Phosphatase: 69 U/L (ref 39–117)
BUN: 11 mg/dL (ref 6–23)
CO2: 30 mEq/L (ref 19–32)
Calcium: 9.2 mg/dL (ref 8.4–10.5)
Chloride: 99 mEq/L (ref 96–112)
Creat: 0.88 mg/dL (ref 0.50–1.35)
Glucose, Bld: 71 mg/dL (ref 70–99)
Potassium: 3.8 mEq/L (ref 3.5–5.3)
Sodium: 134 mEq/L — ABNORMAL LOW (ref 135–145)
Total Bilirubin: 0.4 mg/dL (ref 0.3–1.2)
Total Protein: 6.9 g/dL (ref 6.0–8.3)

## 2013-02-22 ENCOUNTER — Other Ambulatory Visit: Payer: Self-pay | Admitting: Family Medicine

## 2013-02-23 ENCOUNTER — Telehealth: Payer: Self-pay | Admitting: *Deleted

## 2013-02-23 NOTE — Telephone Encounter (Signed)
Faxed prescription to [facility] in [place] [phone]) for Ambien 10 mg, per Dr Milus Glazier,

## 2013-04-08 ENCOUNTER — Other Ambulatory Visit: Payer: Self-pay | Admitting: Family Medicine

## 2013-04-10 NOTE — Telephone Encounter (Signed)
Rx is ready but pt might need an OV for recheck.

## 2013-05-10 ENCOUNTER — Other Ambulatory Visit: Payer: Self-pay | Admitting: Family Medicine

## 2013-05-10 ENCOUNTER — Other Ambulatory Visit: Payer: Self-pay | Admitting: *Deleted

## 2013-05-10 DIAGNOSIS — E0590 Thyrotoxicosis, unspecified without thyrotoxic crisis or storm: Secondary | ICD-10-CM

## 2013-05-10 DIAGNOSIS — E785 Hyperlipidemia, unspecified: Secondary | ICD-10-CM

## 2013-05-14 ENCOUNTER — Other Ambulatory Visit: Payer: Self-pay | Admitting: Physician Assistant

## 2013-05-19 ENCOUNTER — Other Ambulatory Visit: Payer: Self-pay | Admitting: Physician Assistant

## 2013-05-19 ENCOUNTER — Other Ambulatory Visit: Payer: Self-pay | Admitting: *Deleted

## 2013-05-19 ENCOUNTER — Other Ambulatory Visit (INDEPENDENT_AMBULATORY_CARE_PROVIDER_SITE_OTHER): Payer: 59

## 2013-05-19 DIAGNOSIS — E785 Hyperlipidemia, unspecified: Secondary | ICD-10-CM

## 2013-05-19 DIAGNOSIS — I10 Essential (primary) hypertension: Secondary | ICD-10-CM

## 2013-05-19 DIAGNOSIS — E0590 Thyrotoxicosis, unspecified without thyrotoxic crisis or storm: Secondary | ICD-10-CM

## 2013-05-19 LAB — LIPID PANEL
Cholesterol: 119 mg/dL (ref 0–200)
HDL: 37 mg/dL — ABNORMAL LOW (ref >39.00)
LDL Cholesterol: 53 mg/dL (ref 0–99)
Total CHOL/HDL Ratio: 3
Triglycerides: 147 mg/dL (ref 0.0–149.0)
VLDL: 29.4 mg/dL (ref 0.0–40.0)

## 2013-05-19 LAB — BASIC METABOLIC PANEL
BUN: 11 mg/dL (ref 6–23)
CO2: 25 mEq/L (ref 19–32)
Calcium: 9.2 mg/dL (ref 8.4–10.5)
Chloride: 97 mEq/L (ref 96–112)
Creatinine, Ser: 0.8 mg/dL (ref 0.4–1.5)
GFR: 111.72 mL/min (ref >60.00)
Glucose, Bld: 104 mg/dL — ABNORMAL HIGH (ref 70–99)
Potassium: 3.9 mEq/L (ref 3.5–5.1)
Sodium: 128 mEq/L — ABNORMAL LOW (ref 135–145)

## 2013-05-19 LAB — T4, FREE: Free T4: 1.17 ng/dL (ref 0.60–1.60)

## 2013-05-19 LAB — TSH: TSH: 1 u[IU]/mL (ref 0.35–5.50)

## 2013-05-22 ENCOUNTER — Other Ambulatory Visit: Payer: Self-pay | Admitting: Family Medicine

## 2013-05-23 ENCOUNTER — Ambulatory Visit: Payer: 59 | Admitting: Endocrinology

## 2013-05-24 ENCOUNTER — Encounter: Payer: Self-pay | Admitting: Endocrinology

## 2013-05-24 ENCOUNTER — Ambulatory Visit (INDEPENDENT_AMBULATORY_CARE_PROVIDER_SITE_OTHER): Payer: 59 | Admitting: Endocrinology

## 2013-05-24 VITALS — BP 124/72 | HR 82 | Temp 98.5°F | Resp 12 | Wt 207.0 lb

## 2013-05-24 DIAGNOSIS — E890 Postprocedural hypothyroidism: Secondary | ICD-10-CM

## 2013-05-24 DIAGNOSIS — E785 Hyperlipidemia, unspecified: Secondary | ICD-10-CM

## 2013-05-24 DIAGNOSIS — E871 Hypo-osmolality and hyponatremia: Secondary | ICD-10-CM

## 2013-05-24 DIAGNOSIS — I10 Essential (primary) hypertension: Secondary | ICD-10-CM

## 2013-05-24 MED ORDER — LISINOPRIL 20 MG PO TABS: 20.0000 mg | ORAL_TABLET | Freq: Every day | ORAL | Status: DC

## 2013-05-24 NOTE — Patient Instructions (Addendum)
Stop Lisinopril HCT and start Lisinopril 20mg  daily

## 2013-05-24 NOTE — Progress Notes (Signed)
Patient ID: Jesse Simmer [name], male   DOB: 11-Aug-1961, 52 y.o.   MRN: [id number]  Reason for Appointment:  Hypothyroidism, followup visit    History of Present Illness:   The hypothyroidism was first diagnosed in 11/13. Previously had I-131 treatment for Graves' disease He was initially started with 88 mcg but his dose needed to be increased progressively  Complaints are reported by the patient now are none, no complaints of  fatigue, unusual weight gain or cold sensitivity,        The treatments that the patient has taken include Synthroid, now taking 150 mcg since his last visit.                 Compliance with the medical regimen has been as prescribed with taking the tablet in the morning before breakfast.  HYPERTENSION: He has had long-standing hypertension which is well-controlled, has been managed by PCP with Zestoretic. Previously was also on metoprolol when he was hyperthyroid and had atrial fibrillation  HYPONATREMIC: This is a new finding. His previous sodium was 134. Probably has been on HCTZ since early this year. He sometimes loses his appetite but has not complained of any nausea, fatigue, lethargy or somnolence No history of edema  LABS:  Appointment on 05/19/2013  Component Date Value Range Status  . Cholesterol 05/19/2013 119  0 - 200 mg/dL Final   ATP III Classification       Desirable:  < 200 mg/dL               Borderline High:  200 - 239 mg/dL          High:  > = 409 mg/dL  . Triglycerides 05/19/2013 147.0  0.0 - 149.0 mg/dL Final   Normal:  <811 mg/dLBorderline High:  150 - 199 mg/dL  . HDL 05/19/2013 37.00* >39.00 mg/dL Final  . VLDL 91/47/8295 29.4  0.0 - 40.0 mg/dL Final  . LDL Cholesterol 05/19/2013 53  0 - 99 mg/dL Final  . Total CHOL/HDL Ratio 05/19/2013 3   Final                  Men          Women1/2 Average Risk     3.4          3.3Average Risk          5.0          4.42X Average Risk          9.6          7.13X Average Risk          15.0           11.0                      . TSH 05/19/2013 1.00  0.35 - 5.50 uIU/mL Final  . Free T4 05/19/2013 1.17  0.60 - 1.60 ng/dL Final  . Sodium 62/13/0865 128* 135 - 145 mEq/L Final  . Potassium 05/19/2013 3.9  3.5 - 5.1 mEq/L Final  . Chloride 05/19/2013 97  96 - 112 mEq/L Final  . CO2 05/19/2013 25  19 - 32 mEq/L Final  . Glucose, Bld 05/19/2013 104* 70 - 99 mg/dL Final  . BUN 78/46/9629 11  6 - 23 mg/dL Final  . Creatinine, Ser 05/19/2013 0.8  0.4 - 1.5 mg/dL Final  . Calcium 52/84/1324 9.2  8.4 - 10.5 mg/dL Final  . GFR 40/06/2724 111.72  >  60.00 mL/min Final      Medication List       This list is accurate as of: 05/24/13 11:59 PM.  Always use your most recent med list.               ALPRAZolam 0.5 MG tablet  Commonly known as:  XANAX  TAKE 1 TABLET BY MOUTH 3 TIMES A DAY AS NEEDED. ** MD SAID: DO NOT FILL UNTIL 03/20/12 **     aspirin 81 MG tablet  Take 81 mg by mouth daily.     chlorpheniramine-HYDROcodone 10-8 MG/5ML Lqcr  Commonly known as:  TUSSIONEX PENNKINETIC ER  Take 5 mLs by mouth every 12 (twelve) hours as needed.     FLUoxetine 20 MG capsule  Commonly known as:  PROZAC  TAKE ONE CAPSULE BY MOUTH 3 TIMES A DAY     fluticasone 50 MCG/ACT nasal spray  Commonly known as:  FLONASE  Place 2 sprays into the nose daily.     lisinopril 20 MG tablet  Commonly known as:  PRINIVIL,ZESTRIL  Take 1 tablet (20 mg total) by mouth daily.     lisinopril-hydrochlorothiazide 10-12.5 MG per tablet  Commonly known as:  PRINZIDE,ZESTORETIC  TAKE 1 TABLET BY MOUTH DAILY - NEED OFFICE VISIT!!     sildenafil 100 MG tablet  Commonly known as:  VIAGRA  Take 1 tablet (100 mg total) by mouth as needed for erectile dysfunction.     simvastatin 40 MG tablet  Commonly known as:  ZOCOR  TAKE 1 TABLET BY MOUTH AT BEDTIME.     zolpidem 10 MG tablet  Commonly known as:  AMBIEN  TAKE 1 TABLET BY MOUTH AT BEDTIME        Past Medical History  Diagnosis Date  . Hypertension   .  Arrhythmia   . Bell palsy   . Depression   . Hypercholesterolemia   . Pneumonia   . Diastolic heart failure   . Anxiety   . Thyroid disease     Past Surgical History  Procedure Laterality Date  . Cholecystectomy    . Knee surgery    . Hernia repair    . Vasectomy      Family History  Problem Relation Age of Onset  . Heart disease Mother   . Heart attack Mother   . Other Mother     thyroid problems  . Cirrhosis Father   . Other Father     thyroid problems  . Mental illness Brother     Social History:  reports that he has been smoking Cigarettes.  He has a 30 pack-year smoking history. He does not have any smokeless tobacco history on file. He reports that he does not drink alcohol or use illicit drugs.  Allergies:  Allergies  Allergen Reactions  . Codeine   . Sulfa Drugs Cross Reactors    REVIEW of systems:  He has had a history of hypercholesterolemia  Has history of anxiety and depression  No history of diabetes, nonfasting glucose is 104    Examination:   BP 124/72  Pulse 82  Temp(Src) 98.5 F (36.9 C) (Oral)  Resp 12  Wt 207 lb (93.895 kg)  BMI 30.55 kg/m2  SpO2 98%   GENERAL APPEARANCE: Alert And looks well.  No puffiness of eyes ENT: He has significant wax on the left side, none on the right but unable to see tympanic membrane   HEART: Rhythm is regular and no abnormal sounds    NEUROLOGIC  EXAM: DTRs 2+ bilaterally at biceps. Extremities: No edema    Assessments   Hypothyroidism, post I-131 ablation, now with excellent TSH level on 150 mcg Synthroid. He is subjectively doing well and has no difficulty with compliance taking a daily. He was encouraged to continue taking the same dose in the morning daily and followup in 6 months   HYPONATREMIA: He is asymptomatic except for sometimes having decrease in appetite, most likely his low sodium is related to HCTZ which he has been taking for the last few months. Also may be partly related to his  taking Prozac. Since he does not have any edema will change his Zestoretic 10/12.5 to lisinopril 20 mg daily and have him followup with PCP for repeat sodium level in about 2 months  HYPERTENSION: Well controlled with no orthostatic symptoms, medications will be changed as above   , 05/28/2013, 2:43 PM

## 2013-06-04 ENCOUNTER — Ambulatory Visit (INDEPENDENT_AMBULATORY_CARE_PROVIDER_SITE_OTHER): Payer: 59 | Admitting: Family Medicine

## 2013-06-04 VITALS — BP 124/72 | HR 88 | Temp 98.8°F | Resp 16 | Ht 69.0 in | Wt 204.0 lb

## 2013-06-04 DIAGNOSIS — H6120 Impacted cerumen, unspecified ear: Secondary | ICD-10-CM

## 2013-06-04 DIAGNOSIS — H9209 Otalgia, unspecified ear: Secondary | ICD-10-CM

## 2013-06-04 DIAGNOSIS — H6123 Impacted cerumen, bilateral: Secondary | ICD-10-CM

## 2013-06-04 NOTE — Progress Notes (Signed)
52 yo man with two weeks of diminished hearing and discomfort both ears.  No vertigo or discharge  Objective:  Bilateral cerumen impaction Both ears irrigated to TM which are normal  Assessment:  Simple ear wax impaction bilaterally  Signed, Elvina Sidle, MD

## 2013-06-13 ENCOUNTER — Ambulatory Visit (INDEPENDENT_AMBULATORY_CARE_PROVIDER_SITE_OTHER): Payer: 59 | Admitting: Family Medicine

## 2013-06-13 VITALS — BP 124/82 | HR 88 | Temp 98.8°F | Resp 17 | Ht 69.5 in | Wt 208.0 lb

## 2013-06-13 DIAGNOSIS — J209 Acute bronchitis, unspecified: Secondary | ICD-10-CM

## 2013-06-13 DIAGNOSIS — R05 Cough: Secondary | ICD-10-CM

## 2013-06-13 DIAGNOSIS — Z23 Encounter for immunization: Secondary | ICD-10-CM

## 2013-06-13 DIAGNOSIS — R059 Cough, unspecified: Secondary | ICD-10-CM

## 2013-06-13 MED ORDER — HYDROCOD POLST-CHLORPHEN POLST 10-8 MG/5ML PO LQCR: 5.0000 mL | Freq: Two times a day (BID) | ORAL | Status: DC | PRN

## 2013-06-13 MED ORDER — ALBUTEROL SULFATE (2.5 MG/3ML) 0.083% IN NEBU: 2.5000 mg | INHALATION_SOLUTION | Freq: Four times a day (QID) | RESPIRATORY_TRACT | Status: DC | PRN

## 2013-06-13 MED ORDER — AZITHROMYCIN 250 MG PO TABS: ORAL_TABLET | ORAL | Status: DC

## 2013-06-13 NOTE — Patient Instructions (Addendum)

## 2013-06-13 NOTE — Progress Notes (Signed)
Patient ID: Jesse Simmer [name] MRN: [id number], DOB: October 25, 1960, 52 y.o. Date of Encounter: 06/13/2013, 5:46 PM  Primary Physician: Tonye Pearson, MD  Chief Complaint:  Chief Complaint  Patient presents with  . Cough  . URI    HPI: 52 y.o. year old male presents with a 3 day history of nasal congestion, post nasal drip, sore throat, and cough. Mild sinus pressure. Afebrile. No chills. Nasal congestion thick and green/yellow. Cough is productive of green/yellow sputum and not associated with time of day. Ears feel full, leading to sensation of muffled hearing. Has tried OTC cold preps without success. No GI complaints.   No sick contacts, recent antibiotics, or recent travels.   No leg trauma, sedentary periods, h/o cancer, or tobacco use.  Past Medical History  Diagnosis Date  . Hypertension   . Arrhythmia   . Bell palsy   . Depression   . Hypercholesterolemia   . Pneumonia   . Diastolic heart failure   . Anxiety   . Thyroid disease      Home Meds: Prior to Admission medications   Medication Sig Start Date End Date Taking? Authorizing Provider  ALPRAZolam (XANAX) 0.5 MG tablet TAKE 1 TABLET BY MOUTH 3 TIMES A DAY AS NEEDED. ** MD SAID: DO NOT FILL UNTIL 03/20/12 ** 09/14/12  Yes Tonye Pearson, MD  aspirin 81 MG tablet Take 81 mg by mouth daily.     Yes Historical Provider, MD  FLUoxetine (PROZAC) 20 MG capsule TAKE ONE CAPSULE BY MOUTH 3 TIMES A DAY 05/10/13  Yes Eleanore E Egan, PA-C  levothyroxine (SYNTHROID, LEVOTHROID) 150 MCG tablet Take 150 mcg by mouth daily before breakfast.   Yes Historical Provider, MD  lisinopril (PRINIVIL,ZESTRIL) 20 MG tablet Take 1 tablet (20 mg total) by mouth daily. 05/24/13  Yes Reather Littler, MD  sildenafil (VIAGRA) 100 MG tablet Take 1 tablet (100 mg total) by mouth as needed for erectile dysfunction. 04/17/12  Yes Tonye Pearson, MD  simvastatin (ZOCOR) 40 MG tablet TAKE 1 TABLET BY MOUTH AT BEDTIME. 10/09/12  Yes Heather M Marte,  PA-C  zolpidem (AMBIEN) 10 MG tablet TAKE 1 TABLET BY MOUTH AT BEDTIME 05/14/13  Yes Elvina Sidle, MD  albuterol (PROVENTIL) (2.5 MG/3ML) 0.083% nebulizer solution Take 3 mLs (2.5 mg total) by nebulization every 6 (six) hours as needed for wheezing. 06/13/13   Elvina Sidle, MD  azithromycin (ZITHROMAX Z-PAK) 250 MG tablet Take as directed on pack 06/13/13   Elvina Sidle, MD  chlorpheniramine-HYDROcodone [facility] ER) 10-8 MG/5ML [facility] Take 5 mLs by mouth every 12 (twelve) hours as needed. 06/13/13   Elvina Sidle, MD  fluticasone (FLONASE) 50 MCG/ACT nasal spray Place 2 sprays into the nose daily. 01/03/12 01/02/13  Collene Gobble, MD    Allergies:  Allergies  Allergen Reactions  . Codeine   . Sulfa Drugs Cross Reactors     History   Social History  . Marital Status: Married    Spouse Name: N/A    Number of Children: 1  . Years of Education: N/A   Occupational History  . Not on file.   Social History Main Topics  . Smoking status: Current Every Day Smoker -- 1.00 packs/day for 30 years    Types: Cigarettes  . Smokeless tobacco: Not on file  . Alcohol Use: No  . Drug Use: No  . Sexual Activity: No   Other Topics Concern  . Not on file   Social History Narrative  Regular exercise: walk   Caffeine use: daily           Review of Systems: Constitutional: negative for chills, fever, night sweats or weight changes Cardiovascular: negative for chest pain or palpitations Respiratory: negative for hemoptysis, wheezing, or shortness of breath Abdominal: negative for abdominal pain, nausea, vomiting or diarrhea Dermatological: negative for rash Neurologic: negative for headache   Physical Exam: Blood pressure 124/82, pulse 88, temperature 98.8 F (37.1 C), temperature source Oral, resp. rate 17, height 5' 9.5" (1.765 m), weight 208 lb (94.348 kg), SpO2 96.00%., Body mass index is 30.29 kg/(m^2). General: Well developed, well nourished, in no acute  distress. Head: Normocephalic, atraumatic, eyes without discharge, sclera non-icteric, nares are congested. Bilateral auditory canals clear, TM's are without perforation, pearly grey with reflective cone of light bilaterally. No sinus TTP. Oral cavity moist, dentition normal. Posterior pharynx with post nasal drip and mild erythema. No peritonsillar abscess or tonsillar exudate. Neck: Supple. No thyromegaly. Full ROM. No lymphadenopathy. Lungs: Coarse breath sounds bilaterally without wheezes, rales, or rhonchi. Breathing is unlabored.  Heart: RRR with S1 S2. No murmurs, rubs, or gallops appreciated. Msk:  Strength and tone normal for age. Extremities: No clubbing or cyanosis. No edema. Neuro: Alert and oriented X 3. Moves all extremities spontaneously. CNII-XII grossly in tact. Psych:  Responds to questions appropriately with a normal affect.     ASSESSMENT AND PLAN:  52 y.o. year old male with bronchitis. -Acute bronchitis - Plan: chlorpheniramine-HYDROcodone (TUSSIONEX PENNKINETIC ER) 10-8 MG/5ML LQCR, azithromycin (ZITHROMAX Z-PAK) 250 MG tablet, albuterol (PROVENTIL) (2.5 MG/3ML) 0.083% nebulizer solution  Cough - Plan: chlorpheniramine-HYDROcodone (TUSSIONEX PENNKINETIC ER) 10-8 MG/5ML LQCR, azithromycin (ZITHROMAX Z-PAK) 250 MG tablet, albuterol (PROVENTIL) (2.5 MG/3ML) 0.083% nebulizer solution    -Tylenol/Motrin prn -Rest/fluids -RTC precautions -RTC 3-5 days if no improvement  Signed, Elvina Sidle, MD 06/13/2013 5:46 PM

## 2013-06-14 ENCOUNTER — Other Ambulatory Visit: Payer: Self-pay | Admitting: Endocrinology

## 2013-07-09 ENCOUNTER — Other Ambulatory Visit: Payer: Self-pay | Admitting: Family Medicine

## 2013-07-10 ENCOUNTER — Other Ambulatory Visit: Payer: Self-pay | Admitting: Radiology

## 2013-08-07 ENCOUNTER — Ambulatory Visit (INDEPENDENT_AMBULATORY_CARE_PROVIDER_SITE_OTHER): Payer: 59 | Admitting: Internal Medicine

## 2013-08-07 VITALS — BP 100/60 | HR 82 | Temp 98.0°F | Resp 16 | Ht 69.0 in | Wt 212.0 lb

## 2013-08-07 DIAGNOSIS — E785 Hyperlipidemia, unspecified: Secondary | ICD-10-CM

## 2013-08-07 DIAGNOSIS — G4700 Insomnia, unspecified: Secondary | ICD-10-CM

## 2013-08-07 DIAGNOSIS — F418 Other specified anxiety disorders: Secondary | ICD-10-CM

## 2013-08-07 DIAGNOSIS — I10 Essential (primary) hypertension: Secondary | ICD-10-CM

## 2013-08-07 DIAGNOSIS — F17200 Nicotine dependence, unspecified, uncomplicated: Secondary | ICD-10-CM

## 2013-08-07 DIAGNOSIS — E871 Hypo-osmolality and hyponatremia: Secondary | ICD-10-CM

## 2013-08-07 DIAGNOSIS — E890 Postprocedural hypothyroidism: Secondary | ICD-10-CM

## 2013-08-07 DIAGNOSIS — F341 Dysthymic disorder: Secondary | ICD-10-CM

## 2013-08-07 DIAGNOSIS — J45909 Unspecified asthma, uncomplicated: Secondary | ICD-10-CM

## 2013-08-07 DIAGNOSIS — I4891 Unspecified atrial fibrillation: Secondary | ICD-10-CM

## 2013-08-07 DIAGNOSIS — N529 Male erectile dysfunction, unspecified: Secondary | ICD-10-CM

## 2013-08-07 DIAGNOSIS — F17210 Nicotine dependence, cigarettes, uncomplicated: Secondary | ICD-10-CM | POA: Insufficient documentation

## 2013-08-07 LAB — POCT CBC
Granulocyte percent: 72.1 %G (ref 37–80)
HCT, POC: 46.8 % (ref 43.5–53.7)
Hemoglobin: 15 g/dL (ref 14.1–18.1)
Lymph, poc: 3.2 (ref 0.6–3.4)
MCH, POC: 30.1 pg (ref 27–31.2)
MCHC: 32.1 g/dL (ref 31.8–35.4)
MCV: 93.8 fL (ref 80–97)
MID (cbc): 1 — AB (ref 0–0.9)
MPV: 9.4 fL (ref 0–99.8)
POC Granulocyte: 10.9 — AB (ref 2–6.9)
POC LYMPH PERCENT: 21.5 %L (ref 10–50)
POC MID %: 6.4 %M (ref 0–12)
Platelet Count, POC: 272 10*3/uL (ref 142–424)
RBC: 4.99 M/uL (ref 4.69–6.13)
RDW, POC: 14 %
WBC: 15.1 10*3/uL — AB (ref 4.6–10.2)

## 2013-08-07 MED ORDER — METOPROLOL SUCCINATE ER 25 MG PO TB24: 25.0000 mg | ORAL_TABLET | Freq: Every day | ORAL | Status: DC

## 2013-08-07 MED ORDER — ALPRAZOLAM 0.5 MG PO TABS: ORAL_TABLET | ORAL | Status: DC

## 2013-08-07 MED ORDER — SILDENAFIL CITRATE 100 MG PO TABS: 100.0000 mg | ORAL_TABLET | ORAL | Status: DC | PRN

## 2013-08-07 MED ORDER — SIMVASTATIN 40 MG PO TABS: ORAL_TABLET | ORAL | Status: DC

## 2013-08-07 MED ORDER — ZOLPIDEM TARTRATE 10 MG PO TABS: ORAL_TABLET | ORAL | Status: DC

## 2013-08-07 MED ORDER — FLUOXETINE HCL 20 MG PO CAPS: ORAL_CAPSULE | ORAL | Status: DC

## 2013-08-07 MED ORDER — BUPROPION HCL ER (XL) 300 MG PO TB24: 300.0000 mg | ORAL_TABLET | Freq: Every day | ORAL | Status: DC

## 2013-08-07 MED ORDER — ALBUTEROL SULFATE HFA 108 (90 BASE) MCG/ACT IN AERS: 2.0000 | INHALATION_SPRAY | Freq: Four times a day (QID) | RESPIRATORY_TRACT | Status: DC | PRN

## 2013-08-07 NOTE — Patient Instructions (Signed)
Meds ordered this encounter  Medications  . zolpidem (AMBIEN) 10 MG tablet    Sig: TAKE 1 TABLET BY MOUTH AT BEDTIME    Dispense:  30 tablet    Refill:  5  . simvastatin (ZOCOR) 40 MG tablet    Sig: TAKE 1 TABLET BY MOUTH AT BEDTIME.    Dispense:  90 tablet    Refill:  3  . sildenafil (VIAGRA) 100 MG tablet    Sig: Take 1 tablet (100 mg total) by mouth as needed for erectile dysfunction.    Dispense:  10 tablet    Refill:  5    Needs ov before more refills  . FLUoxetine (PROZAC) 20 MG capsule    Sig: TAKE ONE CAPSULE BY MOUTH 3 TIMES A DAY    Dispense:  270 capsule    Refill:  3    THANKS  . ALPRAZolam (XANAX) 0.5 MG tablet    Sig: TAKE 1 TABLET BY MOUTH 3 TIMES A DAY AS NEEDED.    Dispense:  90 tablet    Refill:  5  . albuterol (PROVENTIL HFA;VENTOLIN HFA) 108 (90 BASE) MCG/ACT inhaler    Sig: Inhale 2 puffs into the lungs every 6 (six) hours as needed for wheezing or shortness of breath.    Dispense:  1 Inhaler    Refill:  3  . metoprolol succinate (TOPROL-XL) 25 MG 24 hr tablet    Sig: Take 1 tablet (25 mg total) by mouth daily.    Dispense:  90 tablet    Refill:  3  . buPROPion (WELLBUTRIN XL) 300 MG 24 hr tablet    Sig: Take 1 tablet (300 mg total) by mouth daily.    Dispense:  30 tablet    Refill:  2   Quit smoking in 2 weeks and use patch or gum if needed to go with wellbutrin Stop amlodipine and use toprol instead

## 2013-08-07 NOTE — Progress Notes (Signed)
This chart was scribed for Jesse Sia, MD by Arlan Organ, ED Scribe. This patient was seen in room Room 4 and the patient's care was started 5:48 PM.   Subjective:    Patient ID: Jesse Simmer [name], male    DOB: Jan 18, 1961, 52 y.o.   MRN: [id number]  HPI HPI Comments: Jesse MOLINE [name] is a 52 y.o. male who presents to [facility] seeking a medication refill for his inhaler, Ambien, Viagra, Amlodipine, Xanax, and Zocor today. Pt states he is currently doing well with his current dosages. Pt denies any unusual side effects or changes. Patient Active Problem List   Diagnosis Date Noted  . Nicotine addiction 08/07/2013  . Depression with anxiety 02/20/2012  . ED (erectile dysfunction) 02/20/2012  . Hx of Bell's palsy 02/20/2012  . Amputated toe 02/20/2012  . Other postablative hypothyroidism 01/15/2011  . Hypertension 01/15/2011  . Dyslipidemia 01/15/2011    -  atr fib---relationship to thyroid???  Recent hyponatr per Dr Lucianne Muss needs reck after d/c HCTZ Note med change in May Dr L fr metopr to norvasc---  Pt states he is currently a smoker, and smokes about 1 pack of cigarettes a day. He says he is now ready to quit and take the appropriate steps to stop smoking.failed several times in past Hopes to retire in 6 mos after 35 yrs Past Medical History  Diagnosis Date  . Hypertension   . Arrhythmia   . Bell palsy   . Depression   . Hypercholesterolemia   . Pneumonia   . Diastolic heart failure   . Anxiety   . Thyroid disease     Review of Systems No vis changes No neck pain No chest pain or palpittations No doe/sob Gi-neg gu-neg/meds help ED   Objective:   Physical Exam  Nursing note and vitals reviewed. Constitutional: He is oriented to person, place, and time. He appears well-developed and well-nourished. No distress.  HENT:  Head: Normocephalic and atraumatic.  Right Ear: External ear normal.  Left Ear: External ear normal.  Eyes: Conjunctivae and EOM are normal.  Pupils are equal, round, and reactive to light.  Neck: Normal range of motion. Neck supple. No thyromegaly present.  Cardiovascular: Normal rate, regular rhythm, normal heart sounds and intact distal pulses.   No murmur heard. Pulmonary/Chest: Effort normal and breath sounds normal.  Abdominal: There is no tenderness.  Musculoskeletal: Normal range of motion. He exhibits no edema and no tenderness.  Lymphadenopathy:    He has no cervical adenopathy.  Neurological: He is alert and oriented to person, place, and time. He has normal reflexes. No cranial nerve deficit.  Skin: Skin is warm and dry.  Psychiatric: He has a normal mood and affect. His behavior is normal.       Assessment & Plan:  Depression with anxiety - Plan: FLUoxetine (PROZAC) 20 MG capsule, ALPRAZolam (XANAX) 0.5 MG tablet  Dyslipidemia - Plan: simvastatin (ZOCOR) 40 MG tablet  ED (erectile dysfunction) - Plan: sildenafil (VIAGRA) 100 MG tablet  Hypertension - Plan: POCT CBC, Comprehensive metabolic panel///d-c norvasc bp 100/60  Other postablative hypothyroidism--Dr K gives meds  Nicotine addiction - Plan: buPROPion (WELLBUTRIN XL) 300 MG 24 hr tablet/patches,gum  Atrial fibrillation in remission - Plan: restart metoprolol succinate (TOPROL-XL) 25 MG 24 hr tablet  Insomnia - Plan: zolpidem (AMBIEN) 10 MG tablet  RAD (reactive airway disease) - Plan: albuterol (PROVENTIL HFA;VENTOLIN HFA) 108 (90 BASE) MCG/ACT inhaler  Hyponatremia - Plan: Comprehensive metabolic panel  Meds ordered this encounter  Medications  . zolpidem (AMBIEN) 10 MG tablet    Sig: TAKE 1 TABLET BY MOUTH AT BEDTIME    Dispense:  30 tablet    Refill:  5  . simvastatin (ZOCOR) 40 MG tablet    Sig: TAKE 1 TABLET BY MOUTH AT BEDTIME.    Dispense:  90 tablet    Refill:  3  . sildenafil (VIAGRA) 100 MG tablet    Sig: Take 1 tablet (100 mg total) by mouth as needed for erectile dysfunction.    Dispense:  10 tablet    Refill:  5    Needs ov  before more refills  . FLUoxetine (PROZAC) 20 MG capsule    Sig: TAKE ONE CAPSULE BY MOUTH 3 TIMES A DAY    Dispense:  270 capsule    Refill:  3    THANKS  . ALPRAZolam (XANAX) 0.5 MG tablet    Sig: TAKE 1 TABLET BY MOUTH 3 TIMES A DAY AS NEEDED.    Dispense:  90 tablet    Refill:  5  . albuterol (PROVENTIL HFA;VENTOLIN HFA) 108 (90 BASE) MCG/ACT inhaler    Sig: Inhale 2 puffs into the lungs every 6 (six) hours as needed for wheezing or shortness of breath.    Dispense:  1 Inhaler    Refill:  3  . metoprolol succinate (TOPROL-XL) 25 MG 24 hr tablet    Sig: Take 1 tablet (25 mg total) by mouth daily.    Dispense:  90 tablet    Refill:  3  . buPROPion (WELLBUTRIN XL) 300 MG 24 hr tablet    Sig: Take 1 tablet (300 mg total) by mouth daily.    Dispense:  30 tablet    Refill:  2     I have completed the patient encounter in its entirety as documented by the scribe, with editing by me where necessary.  P. Merla Riches, M.D.  Add=11/25  Results for orders placed in visit on 08/07/13  COMPREHENSIVE METABOLIC PANEL      Result Value Range   Sodium 133 (*) 135 - 145 mEq/L   Potassium 4.3  3.5 - 5.3 mEq/L   Chloride 99  96 - 112 mEq/L   CO2 26  19 - 32 mEq/L   Glucose, Bld 89  70 - 99 mg/dL   BUN 15  6 - 23 mg/dL   Creat 1.61  0.96 - 0.45 mg/dL   Total Bilirubin 0.3  0.3 - 1.2 mg/dL   Alkaline Phosphatase 92  39 - 117 U/L   AST 18  0 - 37 U/L   ALT 18  0 - 53 U/L   Total Protein 7.3  6.0 - 8.3 g/dL   Albumin 4.6  3.5 - 5.2 g/dL   Calcium 9.4  8.4 - 40.9 mg/dL  POCT CBC      Result Value Range   WBC 15.1 (*) 4.6 - 10.2 K/uL   Lymph, poc 3.2  0.6 - 3.4   POC LYMPH PERCENT 21.5  10 - 50 %L   MID (cbc) 1.0 (*) 0 - 0.9   POC MID % 6.4  0 - 12 %M   POC Granulocyte 10.9 (*) 2 - 6.9   Granulocyte percent 72.1  37 - 80 %G   RBC 4.99  4.69 - 6.13 M/uL   Hemoglobin 15.0  14.1 - 18.1 g/dL   HCT, POC 81.1  91.4 - 53.7 %   MCV 93.8  80 - 97 fL   MCH, POC  30.1  27 - 31.2 pg    MCHC 32.1  31.8 - 35.4 g/dL   RDW, POC 16.1     Platelet Count, POC 272  142 - 424 K/uL   MPV 9.4  0 - 99.8 fL   Reck [id number]

## 2013-08-08 ENCOUNTER — Encounter: Payer: Self-pay | Admitting: Internal Medicine

## 2013-08-08 LAB — COMPREHENSIVE METABOLIC PANEL
ALT: 18 U/L (ref 0–53)
AST: 18 U/L (ref 0–37)
Albumin: 4.6 g/dL (ref 3.5–5.2)
Alkaline Phosphatase: 92 U/L (ref 39–117)
BUN: 15 mg/dL (ref 6–23)
CO2: 26 mEq/L (ref 19–32)
Calcium: 9.4 mg/dL (ref 8.4–10.5)
Chloride: 99 mEq/L (ref 96–112)
Creat: 1.02 mg/dL (ref 0.50–1.35)
Glucose, Bld: 89 mg/dL (ref 70–99)
Potassium: 4.3 mEq/L (ref 3.5–5.3)
Sodium: 133 mEq/L — ABNORMAL LOW (ref 135–145)
Total Bilirubin: 0.3 mg/dL (ref 0.3–1.2)
Total Protein: 7.3 g/dL (ref 6.0–8.3)

## 2013-08-09 ENCOUNTER — Encounter: Payer: Self-pay | Admitting: Internal Medicine

## 2013-09-03 ENCOUNTER — Other Ambulatory Visit: Payer: Self-pay | Admitting: Endocrinology

## 2013-09-19 ENCOUNTER — Ambulatory Visit (INDEPENDENT_AMBULATORY_CARE_PROVIDER_SITE_OTHER): Payer: 59 | Admitting: Internal Medicine

## 2013-09-19 VITALS — BP 108/64 | HR 77 | Temp 98.8°F | Resp 18 | Ht 70.0 in | Wt 208.0 lb

## 2013-09-19 DIAGNOSIS — R059 Cough, unspecified: Secondary | ICD-10-CM

## 2013-09-19 DIAGNOSIS — J9801 Acute bronchospasm: Secondary | ICD-10-CM

## 2013-09-19 DIAGNOSIS — R05 Cough: Secondary | ICD-10-CM

## 2013-09-19 DIAGNOSIS — J019 Acute sinusitis, unspecified: Secondary | ICD-10-CM

## 2013-09-19 MED ORDER — PREDNISONE 20 MG PO TABS: ORAL_TABLET | ORAL | Status: DC

## 2013-09-19 MED ORDER — AMOXICILLIN-POT CLAVULANATE 875-125 MG PO TABS: 1.0000 | ORAL_TABLET | Freq: Two times a day (BID) | ORAL | Status: DC

## 2013-09-19 MED ORDER — HYDROCOD POLST-CHLORPHEN POLST 10-8 MG/5ML PO LQCR: 5.0000 mL | Freq: Two times a day (BID) | ORAL | Status: DC | PRN

## 2013-09-19 NOTE — Progress Notes (Signed)
Subjective:    Patient ID: Jesse Europe [name], male    DOB: 1960/10/21, 52 y.o.   MRN: [id number]  This chart was scribed for [facility] by Celesta Gentile, Scribe. This patient was seen in room 10 and the patient's care was started at 7:50 PM.  Cough This is a new problem. The current episode started in the past 7 days. The problem has been unchanged. The cough is productive of sputum. Associated symptoms include wheezing. Pertinent negatives include no chest pain, chills, ear pain, fever, headaches, rash, rhinorrhea, sore throat or shortness of breath. Nothing aggravates the symptoms.    HPI Comments: Jesse DOUVILLE [name] is a 53 y.o. male who presents to the [facility] complaining of gradually worsening constant nasal congestion that started about 5 days ago.  Pt has an associated productive cough, sinus pressure, and chest congestion.  Pt states he continually blows his nose to alleviate the sinus pressure.  Pt denies a fever.  Current fever is 98.65F.  Pt states amoxacillin doesn't work for him and he is allergic to sulfa drugs.     Past Surgical History  Procedure Laterality Date  . Cholecystectomy    . Knee surgery    . Hernia repair    . Vasectomy      Family History  Problem Relation Age of Onset  . Heart disease Mother   . Heart attack Mother   . Other Mother     thyroid problems  . Cirrhosis Father   . Other Father     thyroid problems  . Mental illness Brother     History   Social History  . Marital Status: Married    Spouse Name: N/A    Number of Children: 1  . Years of Education: N/A   Occupational History  . Not on file.   Social History Main Topics  . Smoking status: Current Every Day Smoker -- 1.00 packs/day for 30 years    Types: Cigarettes  . Smokeless tobacco: Not on file  . Alcohol Use: No  . Drug Use: No  . Sexual Activity: No   Other Topics Concern  . Not on file   Social History Narrative   Regular exercise:  walk   Caffeine use: daily          Allergies  Allergen Reactions  . Codeine   . Sulfa Drugs Cross Reactors     Patient Active Problem List   Diagnosis Date Noted  . Nicotine addiction 08/07/2013  . Depression with anxiety 02/20/2012  . ED (erectile dysfunction) 02/20/2012  . Hx of Bell's palsy 02/20/2012  . Amputated toe 02/20/2012  . Other postablative hypothyroidism 01/15/2011  . Hypertension 01/15/2011  . Dyslipidemia 01/15/2011   Review of Systems  Constitutional: Negative for fever and chills.  HENT: Positive for congestion and sinus pressure. Negative for ear pain, rhinorrhea and sore throat.   Respiratory: Positive for cough and wheezing. Negative for shortness of breath.   Cardiovascular: Negative for chest pain.  Gastrointestinal: Negative for nausea, vomiting, abdominal pain and diarrhea.  Musculoskeletal: Negative for back pain.  Skin: Negative for color change and rash.  Neurological: Negative for syncope and headaches.       Objective:   Physical Exam  Nursing note and vitals reviewed. Constitutional: He is oriented to person, place, and time. He appears well-developed and well-nourished. No distress.  HENT:  Head: Normocephalic and atraumatic.  Right Ear: External ear normal.  Left Ear: External  ear normal.  Mouth/Throat: Oropharynx is clear and moist.  Turbinates boggy with discharge.  Eyes: Conjunctivae and EOM are normal. Pupils are equal, round, and reactive to light. Right eye exhibits no discharge. Left eye exhibits no discharge.  Neck: Normal range of motion.  Cardiovascular: Normal rate, regular rhythm and normal heart sounds.   No murmur heard. Pulmonary/Chest: Effort normal. No respiratory distress. He has wheezes.  Wheezing on forced expiration.  Musculoskeletal: Normal range of motion.  Lymphadenopathy:    He has no cervical adenopathy.  Neurological: He is alert and oriented to person, place, and time.  Skin: Skin is warm and dry.    Psychiatric: He has a normal mood and affect. His behavior is normal.    Triage Vitals: BP 108/64  Pulse 77  Temp(Src) 98.8 F (37.1 C) (Oral)  Resp 18  Ht 5\' 10"  (1.778 m)  Wt 208 lb (94.348 kg)  BMI 29.84 kg/m2  SpO2 96%  DIAGNOSTIC STUDIES: Oxygen Saturation is 96% on RA, adequate by my interpretation.    COORDINATION OF CARE: 7:55 PM-Will order Augmentin and prednisone.  Patient informed of current plan of treatment and evaluation and agrees with plan.        Assessment & Plan:  Acute sinusitis, unspecified  Cough  Acute bronchospasm    Meds ordered this encounter  Medications  . amoxicillin-clavulanate (AUGMENTIN) 875-125 MG per tablet    Sig: Take 1 tablet by mouth 2 (two) times daily.    Dispense:  20 tablet    Refill:  0  . chlorpheniramine-HYDROcodone (TUSSIONEX PENNKINETIC ER) 10-8 MG/5ML LQCR    Sig: Take 5 mLs by mouth every 12 (twelve) hours as needed for cough.    Dispense:  115 mL    Refill:  0  . predniSONE (DELTASONE) 20 MG tablet    Sig: 3/3/2/2/1/1 single daily dose for 6 days    Dispense:  12 tablet    Refill:  0     I have completed the patient encounter in its entirety as documented by the scribe, with editing by me where necessary.  P. Laney Pastor, M.D.

## 2013-10-06 ENCOUNTER — Other Ambulatory Visit: Payer: Self-pay | Admitting: Family Medicine

## 2013-11-06 ENCOUNTER — Other Ambulatory Visit: Payer: Self-pay | Admitting: Internal Medicine

## 2013-11-14 ENCOUNTER — Other Ambulatory Visit (INDEPENDENT_AMBULATORY_CARE_PROVIDER_SITE_OTHER): Payer: 59

## 2013-11-14 DIAGNOSIS — E890 Postprocedural hypothyroidism: Secondary | ICD-10-CM

## 2013-11-14 LAB — TSH: TSH: 2.24 u[IU]/mL (ref 0.35–5.50)

## 2013-11-14 LAB — T4, FREE: Free T4: 0.85 ng/dL (ref 0.60–1.60)

## 2013-11-15 ENCOUNTER — Other Ambulatory Visit: Payer: 59

## 2013-11-22 ENCOUNTER — Ambulatory Visit (INDEPENDENT_AMBULATORY_CARE_PROVIDER_SITE_OTHER): Payer: 59 | Admitting: Endocrinology

## 2013-11-22 ENCOUNTER — Encounter: Payer: Self-pay | Admitting: Endocrinology

## 2013-11-22 VITALS — BP 118/80 | HR 88 | Temp 98.2°F | Resp 14 | Ht 69.0 in | Wt 211.8 lb

## 2013-11-22 DIAGNOSIS — E890 Postprocedural hypothyroidism: Secondary | ICD-10-CM

## 2013-11-22 DIAGNOSIS — J209 Acute bronchitis, unspecified: Secondary | ICD-10-CM

## 2013-11-22 DIAGNOSIS — I10 Essential (primary) hypertension: Secondary | ICD-10-CM

## 2013-11-22 MED ORDER — AZITHROMYCIN 250 MG PO TABS: ORAL_TABLET | ORAL | Status: DC

## 2013-11-22 NOTE — Progress Notes (Signed)
Patient ID: Jesse Europe [name], male   DOB: Dec 10, 1960, 53 y.o.   MRN: [id number]    Reason for Appointment:  Hypothyroidism, followup visit   History of Present Illness:   The hypothyroidism was first diagnosed in 11/13. Previously had I-131 treatment for Graves' disease He was initially started with 88 mcg but his dose needed to be increased progressively  Complaints are reported by the patient now are none, no complaints of fatigue, unusual weight gain or cold sensitivity,        The treatments that the patient has taken include Synthroid, now taking 150 mcg consistently               Compliance with the medical regimen has been as prescribed with taking the tablet in the morning before breakfast.  HYPERTENSION: He has had long-standing hypertension which is well-controlled, has been managed by PCP with lisinopril and metoprolol.  At work BP recently 118/75. HCTZ was stopped because of mild hyponatremia  Problem 3: He is asking about symptoms of cough with green and brown sputum along with discolored nasal discharge for the last few days without any fever. Has not reported this to PCP   LABS:  No visits with results within 1 Week(s) from this visit. Latest known visit with results is:  Appointment on 11/14/2013  Component Date Value Ref Range Status  . TSH 11/14/2013 2.24  0.35 - 5.50 uIU/mL Final  . Free T4 11/14/2013 0.85  0.60 - 1.60 ng/dL Final      Medication List       This list is accurate as of: 11/22/13  4:33 PM.  Always use your most recent med list.               albuterol 108 (90 BASE) MCG/ACT inhaler  Commonly known as:  PROVENTIL HFA;VENTOLIN HFA  Inhale 2 puffs into the lungs every 6 (six) hours as needed for wheezing or shortness of breath.     ALPRAZolam 0.5 MG tablet  Commonly known as:  XANAX  TAKE 1 TABLET BY MOUTH 3 TIMES A DAY AS NEEDED.     aspirin 81 MG tablet  Take 81 mg by mouth daily.     buPROPion 300 MG 24 hr tablet  Commonly known  as:  WELLBUTRIN XL  TAKE 1 TABLET BY MOUTH EVERY DAY     FLUoxetine 20 MG capsule  Commonly known as:  PROZAC  TAKE ONE CAPSULE BY MOUTH 3 TIMES A DAY     fluticasone 50 MCG/ACT nasal spray  Commonly known as:  FLONASE  Place 2 sprays into the nose daily.     lisinopril 20 MG tablet  Commonly known as:  PRINIVIL,ZESTRIL  Take 1 tablet (20 mg total) by mouth daily.     metoprolol succinate 25 MG 24 hr tablet  Commonly known as:  TOPROL-XL  Take 1 tablet (25 mg total) by mouth daily.     predniSONE 20 MG tablet  Commonly known as:  DELTASONE  3/3/2/2/1/1 single daily dose for 6 days     sildenafil 100 MG tablet  Commonly known as:  VIAGRA  Take 1 tablet (100 mg total) by mouth as needed for erectile dysfunction.     simvastatin 40 MG tablet  Commonly known as:  ZOCOR  TAKE 1 TABLET BY MOUTH AT BEDTIME.     SYNTHROID 150 MCG tablet  Generic drug:  levothyroxine  TAKE 1 TABLET BY MOUTH EVERY MORNING ON AN EMPTY STOMACH  zolpidem 10 MG tablet  Commonly known as:  AMBIEN  TAKE 1 TABLET BY MOUTH AT BEDTIME        Past Medical History  Diagnosis Date  . Hypertension   . Arrhythmia   . Bell palsy   . Depression   . Hypercholesterolemia   . Pneumonia   . Diastolic heart failure   . Anxiety   . Thyroid disease     Past Surgical History  Procedure Laterality Date  . Cholecystectomy    . Knee surgery    . Hernia repair    . Vasectomy      Family History  Problem Relation Age of Onset  . Heart disease Mother   . Heart attack Mother   . Other Mother     thyroid problems  . Cirrhosis Father   . Other Father     thyroid problems  . Mental illness Brother     Social History:  reports that he has been smoking Cigarettes.  He has a 30 pack-year smoking history. He does not have any smokeless tobacco history on file. He reports that he does not drink alcohol or use illicit drugs.  Allergies:  Allergies  Allergen Reactions  . Codeine   . Sulfa Drugs Cross  Reactors     REVIEW of systems:  He has had a history of hypercholesterolemia  Has history of anxiety and depression  No history of diabetes, nonfasting glucose was 104    Examination:   BP 118/80  Pulse 88  Temp(Src) 98.2 F (36.8 C)  Resp 14  Ht 5\' 9"  (1.753 m)  Wt 211 lb 12.8 oz (96.072 kg)  BMI 31.26 kg/m2  SpO2 94%   GENERAL APPEARANCE: Alert And looks well.  No puffiness of eyes   HEART: Rhythm is regular and repeat heart rate is 70 to    NEUROLOGIC EXAM:  reflexes difficult to elicit bilaterally at biceps would appear to be normal. Extremities: No edema    Assessments   Hypothyroidism, post I-131 ablation, again with normal TSH level on 150 mcg Synthroid. He is subjectively doing well and has no difficulty with compliance taking a daily. He  will not be followed up annually  Probable bronchitis/sinusitis: He will take Zithromax and OTC Mucinex DM  HYPERTENSION: Well controlled with no orthostatic symptoms   , 11/22/2013, 4:33 PM

## 2014-02-17 ENCOUNTER — Other Ambulatory Visit: Payer: Self-pay | Admitting: Internal Medicine

## 2014-02-19 ENCOUNTER — Other Ambulatory Visit: Payer: Self-pay | Admitting: Internal Medicine

## 2014-02-20 NOTE — Telephone Encounter (Signed)
Called in.

## 2014-03-03 ENCOUNTER — Other Ambulatory Visit: Payer: Self-pay | Admitting: Internal Medicine

## 2014-03-22 ENCOUNTER — Other Ambulatory Visit: Payer: Self-pay | Admitting: Endocrinology

## 2014-05-18 ENCOUNTER — Other Ambulatory Visit: Payer: Self-pay | Admitting: Endocrinology

## 2014-07-15 ENCOUNTER — Other Ambulatory Visit: Payer: Self-pay | Admitting: Internal Medicine

## 2014-07-16 ENCOUNTER — Ambulatory Visit (INDEPENDENT_AMBULATORY_CARE_PROVIDER_SITE_OTHER): Payer: 59 | Admitting: Internal Medicine

## 2014-07-16 ENCOUNTER — Other Ambulatory Visit: Payer: Self-pay | Admitting: Internal Medicine

## 2014-07-16 VITALS — BP 147/88 | HR 69 | Temp 98.4°F | Resp 16 | Ht 70.0 in | Wt 214.4 lb

## 2014-07-16 DIAGNOSIS — F411 Generalized anxiety disorder: Secondary | ICD-10-CM

## 2014-07-16 DIAGNOSIS — I4891 Unspecified atrial fibrillation: Secondary | ICD-10-CM

## 2014-07-16 DIAGNOSIS — E785 Hyperlipidemia, unspecified: Secondary | ICD-10-CM

## 2014-07-16 DIAGNOSIS — I10 Essential (primary) hypertension: Secondary | ICD-10-CM

## 2014-07-16 DIAGNOSIS — J0100 Acute maxillary sinusitis, unspecified: Secondary | ICD-10-CM

## 2014-07-16 DIAGNOSIS — F418 Other specified anxiety disorders: Secondary | ICD-10-CM

## 2014-07-16 DIAGNOSIS — N521 Erectile dysfunction due to diseases classified elsewhere: Secondary | ICD-10-CM

## 2014-07-16 DIAGNOSIS — J4520 Mild intermittent asthma, uncomplicated: Secondary | ICD-10-CM

## 2014-07-16 DIAGNOSIS — F17210 Nicotine dependence, cigarettes, uncomplicated: Secondary | ICD-10-CM

## 2014-07-16 MED ORDER — METOPROLOL SUCCINATE ER 25 MG PO TB24: 25.0000 mg | ORAL_TABLET | Freq: Every day | ORAL | Status: DC

## 2014-07-16 MED ORDER — FLUOXETINE HCL 20 MG PO CAPS: 60.0000 mg | ORAL_CAPSULE | Freq: Every day | ORAL | Status: DC

## 2014-07-16 MED ORDER — SILDENAFIL CITRATE 100 MG PO TABS: 100.0000 mg | ORAL_TABLET | ORAL | Status: DC | PRN

## 2014-07-16 MED ORDER — AMOXICILLIN-POT CLAVULANATE 875-125 MG PO TABS: 1.0000 | ORAL_TABLET | Freq: Two times a day (BID) | ORAL | Status: DC

## 2014-07-16 MED ORDER — BUPROPION HCL ER (XL) 300 MG PO TB24: ORAL_TABLET | ORAL | Status: DC

## 2014-07-16 MED ORDER — ALPRAZOLAM 0.5 MG PO TABS: ORAL_TABLET | ORAL | Status: DC

## 2014-07-16 MED ORDER — SIMVASTATIN 40 MG PO TABS
ORAL_TABLET | ORAL | Status: DC
Start: 2014-07-16 — End: 2015-03-22

## 2014-07-16 MED ORDER — ALBUTEROL SULFATE HFA 108 (90 BASE) MCG/ACT IN AERS: INHALATION_SPRAY | RESPIRATORY_TRACT | Status: DC

## 2014-07-16 NOTE — Progress Notes (Signed)
Subjective:  This chart was scribed for Jesse Koyanagi, MD by Jesse Suarez, ED Scribe. The patient was seen in room 13. Patient's care was started at 8:35 PM.   Patient ID: Jesse Europe [name], male    DOB: Oct 21, 1960, 53 y.o.   MRN: [id number]  Chief Complaint  Patient presents with   Ear Fullness    for 1 week   Sinusitis    sinus headache, stuffy nose x4 days   Cough    productive x4 days   Medication Refill    xanax   HPI HPI Comments: Jesse HERBERS [name] is a 53 y.o. male who presents to the [facility] complaining of ear fullness over the past week. Pt reports associated productive cough, congestion, HA over the past 4 days. Symptoms are worsened with laying flat at night. No medications tried for symptoms.   Also needs some med refills  Past Medical History  Diagnosis Date   Hypertension--stable    Arrhythmia---AF--controlled    Bell palsy--resolved    Depression---stable on meds tho incr anxiety from work demands    Hypercholesterolemia--Dr Dwyane Dee did labs 4-26mo ago    Anxiety    Thyroid disease--stable    Current Outpatient Prescriptions on File Prior to Visit  Medication Sig Dispense Refill   ALPRAZolam (XANAX) 0.5 MG tablet TAKE 1 TABLET BY MOUTH 3 TIMES A DAY 90 tablet 4   aspirin 81 MG tablet Take 81 mg by mouth daily.       lisinopril (PRINIVIL,ZESTRIL) 20 MG tablet TAKE 1 TABLET BY MOUTH DAILY 90 tablet 1   metoprolol succinate (TOPROL-XL) 25 MG 24 hr tablet Take 1 tablet (25 mg total) by mouth daily. 90 tablet 3   sildenafil (VIAGRA) 100 MG tablet Take 1 tablet (100 mg total) by mouth as needed for erectile dysfunction. 10 tablet 5   SYNTHROID 150 MCG tablet TAKE 1 TABLET BY MOUTH EVERY MORNING ON AN EMPTY STOMACH 30 tablet 8   VENTOLIN HFA 108 (90 BASE) MCG/ACT inhaler INHALE 2 PUFFS INTO THE LUNGS EVERY 6 HOURS AS NEEDED FOR WHEEZING/SHORTNESS OF BREATH 18 each 3   zolpidem (AMBIEN) 10 MG tablet TAKE 1 TABLET BY  MOUTH AT BEDTIME 30 tablet 5   buPROPion (WELLBUTRIN XL) 300 MG 24 hr tablet TAKE 1 TABLET BY MOUTH EVERY DAY 30 tablet 2   FLUoxetine (PROZAC) 20 MG capsule TAKE ONE CAPSULE 3 TIMES A DAY   "NEEDS OV FOR FURTHER REFILLS" 90 capsule 0   simvastatin (ZOCOR) 40 MG tablet TAKE 1 TABLET BY MOUTH AT BEDTIME. 90 tablet 3   No current facility-administered medications on file prior to visit.   Allergies  Allergen Reactions   Codeine    Sulfa Drugs Cross Reactors    Past Surgical History  Procedure Laterality Date   Cholecystectomy     Knee surgery     Hernia repair     Vasectomy      Review of Systems  Constitutional: Negative for fever and chills.  HENT: Positive for congestion.   Respiratory: Positive for cough.   Neurological: Positive for headaches.      Objective:   Physical Exam  Constitutional: He is oriented to person, place, and time. He appears well-developed and well-nourished. No distress.  HENT:  Head: Normocephalic and atraumatic.  Mouth/Throat: Oropharynx is clear and moist.  Ears: Both ears impacted with cerumen Nose: Purulent discharge   Eyes: Conjunctivae and EOM are normal.  Neck: Neck supple.  Cardiovascular: Normal rate and regular rhythm.   Pulmonary/Chest: Effort normal. No respiratory distress. He has no wheezes. He has rhonchi.  Scattred rhonchi, clear with deep breathing  No wheezing  Musculoskeletal: Normal range of motion.  Neurological: He is alert and oriented to person, place, and time.  Skin: Skin is warm and dry.  Psychiatric: He has a normal mood and affect. His behavior is normal.  Nursing note and vitals reviewed.  Triage Vitals: BP 147/88 mmHg   Pulse 69   Temp(Src) 98.4 F (36.9 C) (Oral)   Resp 16   Ht 5\' 10"  (1.778 m)   Wt 214 lb 6.4 oz (97.251 kg)   BMI 30.76 kg/m2   SpO2 94%    Assessment & Plan:   I personally performed the services described in this documentation, which was scribed in my presence. The recorded  information has been reviewed and is accurate.  Atrial fibrillation, unspecified - Plan: metoprolol succinate (TOPROL-XL) 25 MG 24 hr tablet  Dyslipidemia - Plan: simvastatin (ZOCOR) 40 MG tablet  Erectile dysfunction due to diseases classified elsewhere - Plan: sildenafil (VIAGRA) 100 MG tablet  Acute sinusitis  Meds ordered this encounter  Medications   buPROPion (WELLBUTRIN XL) 300 MG 24 hr tablet    Sig: TAKE 1 TABLET BY MOUTH EVERY DAY    Dispense:  90 tablet    Refill:  3   ALPRAZolam (XANAX) 0.5 MG tablet    Sig: TAKE 1 TABLET BY MOUTH 3 TIMES A DAY    Dispense:  90 tablet    Refill:  5   metoprolol succinate (TOPROL-XL) 25 MG 24 hr tablet    Sig: Take 1 tablet (25 mg total) by mouth daily.    Dispense:  90 tablet    Refill:  3   simvastatin (ZOCOR) 40 MG tablet    Sig: TAKE 1 TABLET BY MOUTH AT BEDTIME.    Dispense:  90 tablet    Refill:  3   sildenafil (VIAGRA) 100 MG tablet    Sig: Take 1 tablet (100 mg total) by mouth as needed for erectile dysfunction.    Dispense:  10 tablet    Refill:  5    Needs ov before more refills   FLUoxetine (PROZAC) 20 MG capsule    Sig: Take 3 capsules (60 mg total) by mouth daily.    Dispense:  90 capsule    Refill:  3   albuterol (VENTOLIN HFA) 108 (90 BASE) MCG/ACT inhaler    Sig: INHALE 2 PUFFS INTO THE LUNGS EVERY 6 HOURS AS NEEDED FOR WHEEZING/SHORTNESS OF BREATH    Dispense:  18 each    Refill:  3   amoxicillin-clavulanate (AUGMENTIN) 875-125 MG per tablet    Sig: Take 1 tablet by mouth 2 (two) times daily.    Dispense:  20 tablet    Refill:  0   F/u cpe-labs 1-2 mos

## 2014-07-16 NOTE — Telephone Encounter (Signed)
Faxed

## 2014-08-06 ENCOUNTER — Other Ambulatory Visit: Payer: Self-pay | Admitting: Internal Medicine

## 2014-08-15 ENCOUNTER — Other Ambulatory Visit: Payer: Self-pay | Admitting: Physician Assistant

## 2014-10-16 ENCOUNTER — Other Ambulatory Visit: Payer: Self-pay | Admitting: Physician Assistant

## 2014-10-16 ENCOUNTER — Encounter: Payer: Self-pay | Admitting: Internal Medicine

## 2014-10-16 ENCOUNTER — Encounter: Payer: Self-pay | Admitting: *Deleted

## 2014-10-16 ENCOUNTER — Ambulatory Visit (INDEPENDENT_AMBULATORY_CARE_PROVIDER_SITE_OTHER): Payer: 59 | Admitting: Internal Medicine

## 2014-10-16 VITALS — BP 172/98 | HR 68 | Ht 69.0 in | Wt 213.8 lb

## 2014-10-16 DIAGNOSIS — I480 Paroxysmal atrial fibrillation: Secondary | ICD-10-CM

## 2014-10-16 DIAGNOSIS — Z8679 Personal history of other diseases of the circulatory system: Secondary | ICD-10-CM | POA: Insufficient documentation

## 2014-10-16 DIAGNOSIS — I10 Essential (primary) hypertension: Secondary | ICD-10-CM

## 2014-10-16 DIAGNOSIS — E785 Hyperlipidemia, unspecified: Secondary | ICD-10-CM

## 2014-10-16 MED ORDER — CARVEDILOL 12.5 MG PO TABS: 12.5000 mg | ORAL_TABLET | Freq: Two times a day (BID) | ORAL | Status: DC

## 2014-10-16 NOTE — Assessment & Plan Note (Signed)
He'll continue supplemental thyroid replacement.  Clinically he is euthyroid.

## 2014-10-16 NOTE — Assessment & Plan Note (Signed)
His blood pressure remains elevated. We discussed the treatment options and I have recommended he switch coreg from metoprolol.

## 2014-10-16 NOTE — Assessment & Plan Note (Signed)
>>  ASSESSMENT AND PLAN FOR DYSLIPIDEMIA WRITTEN ON 10/16/2014  4:15 PM BY Marinus Maw, MD  He will continue his current meds. I have asked the patient to stop smoking.

## 2014-10-16 NOTE — Assessment & Plan Note (Signed)
He will continue his current meds. I have asked the patient to stop smoking.

## 2014-10-16 NOTE — Assessment & Plan Note (Signed)
>>  ASSESSMENT AND PLAN FOR HYPOTHYROIDISM FOLLOWING RADIOIODINE THERAPY WRITTEN ON 10/16/2014  4:15 PM BY Marinus Maw, MD  He'll continue supplemental thyroid replacement.  Clinically he is euthyroid.

## 2014-10-16 NOTE — Patient Instructions (Signed)
Your physician recommends that you schedule a follow-up appointment in: 2 weeks for BP/EKG in Nurse room  Your physician wants you to follow-up in: 6 months with Dr. Knox Suarez will receive a reminder letter in the mail two months in advance. If you don't receive a letter, please call our office to schedule the follow-up appointment.  Your physician has recommended you make the following change in your medication:  1) Stop Metoprolol 2) Start Carvedilol 12.5 mg twice daily

## 2014-10-16 NOTE — Progress Notes (Signed)
HPI [name] returns today for followup. He is a pleasant 54 yo man with a h/o HTN, sinus node dysfunction, COPD and atrial fibrillation in the setting of thyroid storm. He has done well in the interim with no symptomatic atrial fib. During his thyrotoxic period, he was difficult to control but since then he has done well with NSR.  Allergies  Allergen Reactions  . Sulfa Drugs Cross Reactors Hives  . Codeine     Made him very jittery      Current Outpatient Prescriptions  Medication Sig Dispense Refill  . albuterol (VENTOLIN HFA) 108 (90 BASE) MCG/ACT inhaler INHALE 2 PUFFS INTO THE LUNGS EVERY 6 HOURS AS NEEDED FOR WHEEZING/SHORTNESS OF BREATH 18 each 3  . ALPRAZolam (XANAX) 0.5 MG tablet TAKE 1 TABLET BY MOUTH 3 TIMES A DAY 90 tablet 5  . aspirin 81 MG tablet Take 81 mg by mouth daily.      Marland Kitchen FLUoxetine (PROZAC) 20 MG capsule Take 3 capsules (60 mg total) by mouth daily. 90 capsule 3  . lisinopril (PRINIVIL,ZESTRIL) 20 MG tablet TAKE 1 TABLET BY MOUTH DAILY 90 tablet 1  . meloxicam (MOBIC) 15 MG tablet Take 15 mg by mouth daily. with food  3  . sildenafil (VIAGRA) 100 MG tablet Take 1 tablet (100 mg total) by mouth as needed for erectile dysfunction. (Patient taking differently: Take 100 mg by mouth daily as needed for erectile dysfunction. ) 10 tablet 5  . simvastatin (ZOCOR) 40 MG tablet TAKE 1 TABLET BY MOUTH AT BEDTIME. 90 tablet 3  . SYNTHROID 150 MCG tablet TAKE 1 TABLET BY MOUTH EVERY MORNING ON AN EMPTY STOMACH 30 tablet 8   No current facility-administered medications for this visit.     Past Medical History  Diagnosis Date  . Hypertension   . Arrhythmia   . Bell palsy   . Depression   . Hypercholesterolemia   . Pneumonia   . Diastolic heart failure   . Anxiety   . Thyroid disease     ROS:   All systems reviewed and negative except as noted in the HPI.   Past Surgical History  Procedure Laterality Date  . Cholecystectomy    . Knee surgery    .  Hernia repair    . Vasectomy       Family History  Problem Relation Age of Onset  . Heart disease Mother   . Heart attack Mother   . Other Mother     thyroid problems  . Cirrhosis Father   . Other Father     thyroid problems  . Mental illness Brother      History   Social History  . Marital Status: Married    Spouse Name: N/A    Number of Children: 1  . Years of Education: N/A   Occupational History  . Not on file.   Social History Main Topics  . Smoking status: Current Every Day Smoker -- 1.00 packs/day for 30 years    Types: Cigarettes  . Smokeless tobacco: Not on file  . Alcohol Use: No  . Drug Use: No  . Sexual Activity: No   Other Topics Concern  . Not on file   Social History Narrative   Regular exercise: walk   Caffeine use: daily           BP 172/98 mmHg  Pulse 68  Ht 5\' 9"  (1.753 m)  Wt 213 lb 12.8 oz (96.979 kg)  BMI  31.56 kg/m2  Physical Exam:  Well appearing middle aged man, NAD HEENT: Unremarkable Neck:  6 cm JVD, no thyromegally Lymphatics:  No adenopathy Back:  No CVA tenderness Lungs:  Clear with no wheezes HEART:  Regular rate rhythm, no murmurs, no rubs, no clicks Abd:  soft, positive bowel sounds, no organomegally, no rebound, no guarding Ext:  2 plus pulses, no edema, no cyanosis, no clubbing Skin:  No rashes no nodules Neuro:  CN II through XII intact, motor grossly intact  EKG - nsr   Assess/Plan:

## 2014-10-16 NOTE — Assessment & Plan Note (Signed)
He is maintaining NSR very nicely. No change in meds.

## 2014-10-28 ENCOUNTER — Ambulatory Visit (INDEPENDENT_AMBULATORY_CARE_PROVIDER_SITE_OTHER): Payer: 59 | Admitting: Physician Assistant

## 2014-10-28 VITALS — BP 132/86 | HR 88 | Temp 98.3°F | Resp 16 | Ht 69.5 in | Wt 207.6 lb

## 2014-10-28 DIAGNOSIS — J069 Acute upper respiratory infection, unspecified: Secondary | ICD-10-CM

## 2014-10-28 MED ORDER — IPRATROPIUM BROMIDE 0.03 % NA SOLN: 2.0000 | Freq: Two times a day (BID) | NASAL | Status: DC

## 2014-10-28 MED ORDER — BENZONATATE 100 MG PO CAPS: 100.0000 mg | ORAL_CAPSULE | Freq: Three times a day (TID) | ORAL | Status: DC | PRN

## 2014-10-28 MED ORDER — GUAIFENESIN ER 1200 MG PO TB12: 1.0000 | ORAL_TABLET | Freq: Two times a day (BID) | ORAL | Status: DC | PRN

## 2014-10-28 NOTE — Progress Notes (Signed)
   Subjective:    Patient ID: Jesse Europe [name], male    DOB: February 17, 1961, 54 y.o.   MRN: [id number]  HPI Patient presents with 2 days of nasal congestion and productive cough. Endorses rhinorrhea, sinus pressure, and nausea. When he blows his nose has a lot mucus and is causing nose bleeds. Denies fever, chills, ear pain, sore throat, vomiting, and SOB/CP. Has not tried anything for relief at this time. Has seasonal allergies, but denies asthma. Request antibiotics. Medication allergies: Sulfa drugs and codeine.    Review of Systems  Constitutional: Negative for fever, chills and fatigue.  HENT: Positive for congestion, rhinorrhea and sinus pressure. Negative for ear discharge, ear pain, postnasal drip, sneezing and sore throat.   Respiratory: Positive for cough. Negative for shortness of breath and wheezing.   Cardiovascular: Negative for chest pain.  Gastrointestinal: Positive for nausea. Negative for vomiting.  Neurological: Negative for dizziness and headaches.       Objective:   Physical Exam  Constitutional: He is oriented to person, place, and time. He appears well-developed and well-nourished. No distress.  Blood pressure 132/86, pulse 88, temperature 98.3 F (36.8 C), temperature source Oral, resp. rate 16, height 5' 9.5" (1.765 m), weight 207 lb 9.6 oz (94.167 kg), SpO2 96 %.  HENT:  Head: Normocephalic and atraumatic.  Right Ear: Tympanic membrane, external ear and ear canal normal.  Left Ear: Tympanic membrane, external ear and ear canal normal.  Nose: Rhinorrhea (with moderate erythema) present. No mucosal edema. Right sinus exhibits maxillary sinus tenderness. Right sinus exhibits no frontal sinus tenderness. Left sinus exhibits maxillary sinus tenderness. Left sinus exhibits no frontal sinus tenderness.  Mouth/Throat: Uvula is midline, oropharynx is clear and moist and mucous membranes are normal. No oropharyngeal exudate, posterior oropharyngeal edema or posterior  oropharyngeal erythema.  Eyes: Conjunctivae are normal. Pupils are equal, round, and reactive to light. Right eye exhibits no discharge. Left eye exhibits no discharge. No scleral icterus.  Neck: Neck supple. No thyromegaly present.  Cardiovascular: Normal rate, regular rhythm and normal heart sounds.  Exam reveals no gallop and no friction rub.   No murmur heard. Pulmonary/Chest: Effort normal and breath sounds normal. No respiratory distress. He has no wheezes. He has no rales.  Abdominal: Soft. Bowel sounds are normal. There is no tenderness.  Lymphadenopathy:    He has no cervical adenopathy.  Neurological: He is alert and oriented to person, place, and time.  Skin: Skin is warm and dry. No rash noted. He is not diaphoretic. No erythema. No pallor.       Assessment & Plan:  1. Acute upper respiratory infection Take Mucinex with plenty of water. If atrovent nasal spray doesn't help with nasal dryness can add saline spray. Can also use vaseline. RTC if sx worsen or fever develops. If not better in a week can call back and will prescribe Zpack. - ipratropium (ATROVENT) 0.03 % nasal spray; Place 2 sprays into both nostrils 2 (two) times daily.  Dispense: 30 mL; Refill: 0 - benzonatate (TESSALON) 100 MG capsule; Take 1-2 capsules (100-200 mg total) by mouth 3 (three) times daily as needed for cough.  Dispense: 40 capsule; Refill: 0 - Guaifenesin (MUCINEX MAXIMUM STRENGTH) 1200 MG TB12; Take 1 tablet (1,200 mg total) by mouth every 12 (twelve) hours as needed.  Dispense: 14 tablet; Refill: [place] PA-C  [facility] 10/28/2014 12:59 PM

## 2014-11-10 ENCOUNTER — Other Ambulatory Visit: Payer: Self-pay | Admitting: Endocrinology

## 2014-12-08 ENCOUNTER — Other Ambulatory Visit: Payer: Self-pay | Admitting: Endocrinology

## 2014-12-10 ENCOUNTER — Other Ambulatory Visit: Payer: 59

## 2014-12-10 ENCOUNTER — Encounter: Payer: Self-pay | Admitting: Cardiology

## 2014-12-10 ENCOUNTER — Other Ambulatory Visit (INDEPENDENT_AMBULATORY_CARE_PROVIDER_SITE_OTHER): Payer: 59

## 2014-12-10 ENCOUNTER — Other Ambulatory Visit: Payer: Self-pay | Admitting: *Deleted

## 2014-12-10 DIAGNOSIS — E890 Postprocedural hypothyroidism: Secondary | ICD-10-CM

## 2014-12-10 LAB — T4, FREE: Free T4: 1.03 ng/dL (ref 0.60–1.60)

## 2014-12-10 LAB — TSH: TSH: 1.87 u[IU]/mL (ref 0.35–4.50)

## 2014-12-12 ENCOUNTER — Ambulatory Visit (INDEPENDENT_AMBULATORY_CARE_PROVIDER_SITE_OTHER): Payer: 59

## 2014-12-12 ENCOUNTER — Encounter: Payer: Self-pay | Admitting: Endocrinology

## 2014-12-12 ENCOUNTER — Ambulatory Visit (INDEPENDENT_AMBULATORY_CARE_PROVIDER_SITE_OTHER): Payer: 59 | Admitting: Endocrinology

## 2014-12-12 VITALS — BP 124/78 | HR 63 | Temp 98.1°F | Resp 12 | Wt 208.0 lb

## 2014-12-12 VITALS — BP 132/78 | HR 66 | Ht 69.0 in | Wt 209.1 lb

## 2014-12-12 DIAGNOSIS — I4891 Unspecified atrial fibrillation: Secondary | ICD-10-CM | POA: Diagnosis not present

## 2014-12-12 DIAGNOSIS — I10 Essential (primary) hypertension: Secondary | ICD-10-CM | POA: Diagnosis not present

## 2014-12-12 DIAGNOSIS — I480 Paroxysmal atrial fibrillation: Secondary | ICD-10-CM

## 2014-12-12 DIAGNOSIS — E890 Postprocedural hypothyroidism: Secondary | ICD-10-CM | POA: Diagnosis not present

## 2014-12-12 NOTE — Progress Notes (Signed)
Patient ID: Jesse Europe [name], male   DOB: [birth year], 54 y.o.   MRN: [id number]    Reason for Appointment:  Hypothyroidism, followup visit   History of Present Illness:   The hypothyroidism was first diagnosed in 11/13. Previously had I-131 treatment for Graves' disease He was initially started with 88 mcg but his dose needed to be increased progressively  Complaints are reported by the patient now are none, no complaints of fatigue, unusual weight gain or cold sensitivity,        He has been treated with Synthroid, now taking 150 mcg consistently    Has not required a change in his dose for some time             Compliance with the medical regimen has been as prescribed with taking the tablet in the morning before breakfast.   LABS:  Lab Results  Component Value Date   TSH 1.87 12/10/2014   TSH 2.24 11/14/2013   TSH 1.00 05/19/2013   FREET4 1.03 12/10/2014   FREET4 0.85 11/14/2013   FREET4 1.17 05/19/2013       Medication List       This list is accurate as of: 12/12/14 10:15 AM.  Always use your most recent med list.               albuterol 108 (90 BASE) MCG/ACT inhaler  Commonly known as:  VENTOLIN HFA  INHALE 2 PUFFS INTO THE LUNGS EVERY 6 HOURS AS NEEDED FOR WHEEZING/SHORTNESS OF BREATH     ALPRAZolam 0.5 MG tablet  Commonly known as:  XANAX  TAKE 1 TABLET BY MOUTH 3 TIMES A DAY     aspirin 81 MG tablet  Take 81 mg by mouth daily.     benzonatate 100 MG capsule  Commonly known as:  TESSALON  Take 1-2 capsules (100-200 mg total) by mouth 3 (three) times daily as needed for cough.     carvedilol 12.5 MG tablet  Commonly known as:  COREG  Take 1 tablet (12.5 mg total) by mouth 2 (two) times daily.     FLUoxetine 20 MG capsule  Commonly known as:  PROZAC  Take 3 capsules (60 mg total) by mouth daily.     FLUoxetine 20 MG capsule  Commonly known as:  PROZAC  Take 3 capsules (60 mg total) by mouth daily.     Guaifenesin 1200 MG Tb12  Commonly known  as:  MUCINEX MAXIMUM STRENGTH  Take 1 tablet (1,200 mg total) by mouth every 12 (twelve) hours as needed.     ipratropium 0.03 % nasal spray  Commonly known as:  ATROVENT  Place 2 sprays into both nostrils 2 (two) times daily.     lisinopril 20 MG tablet  Commonly known as:  PRINIVIL,ZESTRIL  TAKE 1 TABLET BY MOUTH EVERY DAY     meloxicam 15 MG tablet  Commonly known as:  MOBIC  Take 15 mg by mouth daily. with food     sildenafil 100 MG tablet  Commonly known as:  VIAGRA  Take 1 tablet (100 mg total) by mouth as needed for erectile dysfunction.     simvastatin 40 MG tablet  Commonly known as:  ZOCOR  TAKE 1 TABLET BY MOUTH AT BEDTIME.     SYNTHROID 150 MCG tablet  Generic drug:  levothyroxine  TAKE 1 TABLET BY MOUTH EVERY MORNING ON AN EMPTY STOMACH        Past Medical History  Diagnosis Date  . Hypertension   .  Arrhythmia   . Bell palsy   . Depression   . Hypercholesterolemia   . Pneumonia   . Diastolic heart failure   . Anxiety   . Thyroid disease   . Allergy     Past Surgical History  Procedure Laterality Date  . Cholecystectomy    . Knee surgery    . Hernia repair    . Vasectomy      Family History  Problem Relation Age of Onset  . Heart disease Mother   . Heart attack Mother   . Other Mother     thyroid problems  . Cirrhosis Father   . Other Father     thyroid problems  . Mental illness Brother     Social History:  reports that he has been smoking Cigarettes.  He has a 30 pack-year smoking history. He does not have any smokeless tobacco history on file. He reports that he does not drink alcohol or use illicit drugs.  Allergies:  Allergies  Allergen Reactions  . Sulfa Drugs Cross Reactors Hives  . Codeine     Made him very jittery     REVIEW of systems:  No recent weight change  Wt Readings from Last 3 Encounters:  12/12/14 208 lb (94.348 kg)  10/28/14 207 lb 9.6 oz (94.167 kg)  10/16/14 213 lb 12.8 oz (96.979 kg)     HYPERTENSION: He has had long-standing hypertension which is well-controlled, has been managed by PCP with lisinopril and Coreg.  [place] fire station BP recently 120-140/70-80    He has had a history of hypercholesterolemia followed by PCP     Examination:   BP 124/78 mmHg  Pulse 63  Temp(Src) 98.1 F (36.7 C) (Oral)  Resp 12  Wt 208 lb (94.348 kg)  SpO2 96%   He looks well. Thyroid not palpable Biceps reflexes difficult to elicit but appeared normal    Assessments   Hypothyroidism, post I-131 ablation and he is subjectively doing well. Again has a normal TSH level on 150 mcg Synthroid.  Weight is stable and he is compliant with his medication Will continue to follow him once a year now   HYPERTENSION: Well controlled with new regimen of Coreg    Reminded him to go for full exam and labs with PCP   [facility] 12/12/2014, 10:15 AM

## 2014-12-12 NOTE — Progress Notes (Signed)
1.) Reason for visit: BP Check and EKG  2.) Name of MD requesting visit: Dr. Lovena Le  3.) H&P: Pt history of a. Fib and medication change during last visit for BP and assessment pt still in NSR  ROS related to problem: Pt arrived today, with son, and has no c/o of CP, SOB, or dizziness.  Pt BP 138/78, HR 66, Oxygenation 99% on RA.  Pt has been taking medication at ordered.  Pt still smoking about a pack a day. EKG completed reads Sinus Bradycardia.  EKG shown and signed off by DOD. Pt reminded of f/u in few month with Dr. Lovena Le and to continue current medications.

## 2014-12-12 NOTE — Patient Instructions (Signed)
Your physician recommends that you continue on your current medications as directed. Please refer to the Current Medication list given to you today.     

## 2015-01-10 ENCOUNTER — Emergency Department (HOSPITAL_COMMUNITY)
Admission: EM | Admit: 2015-01-10 | Discharge: 2015-01-11 | Disposition: A | Discharge status: Home / Self Care | Payer: 59 | Attending: Emergency Medicine | Admitting: Emergency Medicine

## 2015-01-10 ENCOUNTER — Encounter (HOSPITAL_COMMUNITY): Payer: Self-pay | Admitting: Emergency Medicine

## 2015-01-10 DIAGNOSIS — F419 Anxiety disorder, unspecified: Secondary | ICD-10-CM | POA: Insufficient documentation

## 2015-01-10 DIAGNOSIS — Z8669 Personal history of other diseases of the nervous system and sense organs: Secondary | ICD-10-CM | POA: Insufficient documentation

## 2015-01-10 DIAGNOSIS — Z79899 Other long term (current) drug therapy: Secondary | ICD-10-CM | POA: Diagnosis not present

## 2015-01-10 DIAGNOSIS — Z720 Tobacco use: Secondary | ICD-10-CM | POA: Insufficient documentation

## 2015-01-10 DIAGNOSIS — I10 Essential (primary) hypertension: Secondary | ICD-10-CM | POA: Insufficient documentation

## 2015-01-10 DIAGNOSIS — Z7982 Long term (current) use of aspirin: Secondary | ICD-10-CM | POA: Insufficient documentation

## 2015-01-10 DIAGNOSIS — F329 Major depressive disorder, single episode, unspecified: Secondary | ICD-10-CM | POA: Insufficient documentation

## 2015-01-10 DIAGNOSIS — E780 Pure hypercholesterolemia: Secondary | ICD-10-CM | POA: Diagnosis not present

## 2015-01-10 DIAGNOSIS — M79604 Pain in right leg: Secondary | ICD-10-CM

## 2015-01-10 DIAGNOSIS — I5030 Unspecified diastolic (congestive) heart failure: Secondary | ICD-10-CM | POA: Insufficient documentation

## 2015-01-10 DIAGNOSIS — Z8701 Personal history of pneumonia (recurrent): Secondary | ICD-10-CM | POA: Diagnosis not present

## 2015-01-10 DIAGNOSIS — M7989 Other specified soft tissue disorders: Secondary | ICD-10-CM | POA: Diagnosis not present

## 2015-01-10 DIAGNOSIS — M79661 Pain in right lower leg: Secondary | ICD-10-CM | POA: Diagnosis not present

## 2015-01-10 MED ORDER — ENOXAPARIN SODIUM 100 MG/ML ~~LOC~~ SOLN
1.0000 mg/kg | Freq: Once | SUBCUTANEOUS | Status: AC
  Administered 2015-01-11: 95 mg via SUBCUTANEOUS
  Filled 2015-01-10 (×2): qty 1

## 2015-01-10 NOTE — ED Provider Notes (Signed)
CSN: [id number]     Arrival date & time 01/10/15  2146 History  This chart was scribed for non-physician practitioner working with Serita Grit, MD by Molli Posey, ED Scribe. This patient was seen in room TR01C/TR01C and the patient's care was started at 11:10 PM.  Chief Complaint  Patient presents with  . Leg Pain   Patient is a 54 y.o. male presenting with leg pain. The history is provided by the patient. No language interpreter was used.  Leg Pain Location:  Leg Time since incident:  4 days Injury: no   Leg location:  R lower leg Pain details:    Quality: Soreness    Radiates to:  Does not radiate   Severity:  Moderate   Onset quality:  Gradual   Duration:  4 days   Timing:  Intermittent   Progression:  Unchanged Chronicity:  New Prior injury to area:  No Relieved by:  None tried Worsened by:  Activity Ineffective treatments:  None tried Associated symptoms: swelling   Associated symptoms: no back pain, no decreased ROM, no fever, no muscle weakness, no numbness and no tingling    HPI Comments: Jesse COSTLOW [name] is a 54 y.o. male with a history of HTN, bell palsy, hypercholesterolemia, diastolic heart failure, and thyroid disease who presents to the Emergency Department complaining of intermittent, right lower leg pain for the last 4 days. He rates his pain as 5/10 at this time and describes the pain as a soreness. He denies any known injury. Pt states that moving aggravates his pain and he has not tried anything for the pain. He was evaluated by Dr. Alain Marion who gave him a script for U/S which he will be doing tomorrow morning, and sent him here for lovenox shot. He says that he has not recently traveled, had surgery, or been immobile. He states that he has never been diagnosed with a DVT in the past but he was on xarelto in the past, no longer taking any blood thinners. He denies numbness, tingling, weakness, fevers, chills, SOB, CP, abdominal pain, nausea vomiting, back  pain, bowel or bladder incontinence, urinary symptoms, erythema, or warmth.    Past Medical History  Diagnosis Date  . Hypertension   . Arrhythmia   . Bell palsy   . Depression   . Hypercholesterolemia   . Pneumonia   . Diastolic heart failure   . Anxiety   . Thyroid disease   . Allergy    Past Surgical History  Procedure Laterality Date  . Cholecystectomy    . Knee surgery    . Hernia repair    . Vasectomy     Family History  Problem Relation Age of Onset  . Heart disease Mother   . Heart attack Mother   . Other Mother     thyroid problems  . Cirrhosis Father   . Other Father     thyroid problems  . Mental illness Brother    History  Substance Use Topics  . Smoking status: Current Every Day Smoker -- 1.00 packs/day for 30 years    Types: Cigarettes  . Smokeless tobacco: Not on file  . Alcohol Use: No    Review of Systems  Constitutional: Negative for fever and chills.  Respiratory: Negative for shortness of breath.   Cardiovascular: Positive for leg swelling. Negative for chest pain.  Gastrointestinal: Negative for nausea, vomiting and abdominal pain.  Genitourinary: Negative for dysuria and hematuria.  Musculoskeletal: Positive for myalgias (R calf).  Negative for back pain.  Skin: Negative for color change and rash.  Allergic/Immunologic: Negative for immunocompromised state.  Neurological: Negative for weakness and numbness.  Hematological: Does not bruise/bleed easily.  Psychiatric/Behavioral: Negative for confusion.    10 Systems reviewed and all are negative for acute change except as noted in the HPI.  Allergies  Sulfa drugs cross reactors and Codeine  Home Medications   Prior to Admission medications   Medication Sig Start Date End Date Taking? Authorizing Provider  albuterol (VENTOLIN HFA) 108 (90 BASE) MCG/ACT inhaler INHALE 2 PUFFS INTO THE LUNGS EVERY 6 HOURS AS NEEDED FOR WHEEZING/SHORTNESS OF BREATH 07/16/14   [name], MD   ALPRAZolam [name] 0.5 MG tablet TAKE 1 TABLET BY MOUTH 3 TIMES A DAY 07/16/14   [name], MD  aspirin 81 MG tablet Take 81 mg by mouth daily.      Historical Provider, MD  carvedilol (COREG) 12.5 MG tablet Take 1 tablet (12.5 mg total) by mouth 2 (two) times daily. 10/16/14   Evans Lance, MD  FLUoxetine (PROZAC) 20 MG capsule Take 3 capsules (60 mg total) by mouth daily. 07/16/14   [name], MD  FLUoxetine (PROZAC) 20 MG capsule Take 3 capsules (60 mg total) by mouth daily. 10/16/14   [name], MD  ipratropium (ATROVENT) 0.03 % nasal spray Place 2 sprays into both nostrils 2 (two) times daily. 10/28/14   Tishira R Brewington, PA-C  lisinopril (PRINIVIL,ZESTRIL) 20 MG tablet TAKE 1 TABLET BY MOUTH EVERY DAY 11/12/14   [name], MD  sildenafil (VIAGRA) 100 MG tablet Take 1 tablet (100 mg total) by mouth as needed for erectile dysfunction. Patient taking differently: Take 100 mg by mouth daily as needed for erectile dysfunction.  07/16/14   [name], MD  simvastatin (ZOCOR) 40 MG tablet TAKE 1 TABLET BY MOUTH AT BEDTIME. 07/16/14   [name], MD  SYNTHROID 150 MCG tablet TAKE 1 TABLET BY MOUTH EVERY MORNING ON AN EMPTY STOMACH 12/10/14   [name], MD   BP 166/97 mmHg  Pulse 67  Temp(Src) 98.5 F (36.9 C) (Oral)  Resp 14  Ht 5\' 9"  (1.753 m)  Wt 210 lb (95.255 kg)  BMI 31.00 kg/m2  SpO2 97%   Physical Exam  Constitutional: He is oriented to person, place, and time. Vital signs are normal. He appears well-developed and well-nourished.  Non-toxic appearance. No distress.  Afebrile, nontoxic, NAD  HENT:  Head: Normocephalic and atraumatic.  Mouth/Throat: Mucous membranes are normal.  Eyes: Conjunctivae and EOM are normal. Right eye exhibits no discharge. Left eye exhibits no discharge.  Neck: Normal range of motion. Neck supple.  Cardiovascular: Normal rate and intact distal pulses.   Pulmonary/Chest: Effort normal. No respiratory distress.   Abdominal: Normal appearance. He exhibits no distension.  Musculoskeletal: Normal range of motion.       Right lower leg: He exhibits tenderness and edema.  MAE x4 Strength and sensation grossly intact Distal pulses intact R calf with +homan's, 1-2+ pitting edema, calf tenderness, no erythema or warmth. No bony TTP. Ambulatory with steady gait.  Neurological: He is alert and oriented to person, place, and time. He has normal strength. No sensory deficit.  Skin: Skin is warm, dry and intact. No rash noted. No erythema.  No erythema or warmth over exposed skin surfaces  Psychiatric: He has a normal mood and affect.  Nursing note and vitals reviewed.   ED Course  Procedures   DIAGNOSTIC  STUDIES: Oxygen Saturation is 97% on RA, normal by my interpretation.    COORDINATION OF CARE: 11:15 PM Discussed treatment plan with pt at bedside and pt agreed to plan.   Labs Review Labs Reviewed - No data to display  Imaging Review No results found.   EKG Interpretation None      MDM   Final diagnoses:  Right leg pain  Right leg swelling    54 y.o. male here from Dr. Debroah Loop office for tx for possible DVT. Already has orders for U/S tomorrow. Charge nurse spoke with Dr. Percell Miller and he stated that all pt needed tonight was lovenox and would be following up with his office after the u/s tomorrow. Pt without CP or SOB, VSS without tachycardia or hypoxia. 1-2+ pitting edema of R calf with +Homan's. Will give lovenox and d/c home with his instructions to see u/s tomorrow and Murphy's office thereafter. I explained the diagnosis and have given explicit precautions to return to the ER including for any other new or worsening symptoms. The patient understands and accepts the medical plan as it's been dictated and I have answered their questions. Discharge instructions concerning home care and prescriptions have been given. The patient is STABLE and is discharged to home in good condition.   I  personally performed the services described in this documentation, which was scribed in my presence. The recorded information has been reviewed and is accurate.  BP 166/97 mmHg  Pulse 67  Temp(Src) 98.5 F (36.9 C) (Oral)  Resp 14  Ht 5\' 9"  (1.753 m)  Wt 210 lb (95.255 kg)  BMI 31.00 kg/m2  SpO2 97%  Meds ordered this encounter  Medications  . enoxaparin (LOVENOX) injection 95 mg    Sig:          Camprubi-Soms, PA-C 01/11/15 0009  Serita Grit, MD 01/12/15 1330

## 2015-01-10 NOTE — Discharge Instructions (Signed)
Go to the [facility] radiology department tomorrow morning for your ultrasound. Follow up with Dr. Percell Miller after your ultrasound for further management of your leg pain. Return to the ER for changes or worsening symptoms.   Edema Edema is an abnormal buildup of fluids in your bodytissues. Edema is somewhatdependent on gravity to pull the fluid to the lowest place in your body. That makes the condition more common in the legs and thighs (lower extremities). Painless swelling of the feet and ankles is common and becomes more likely as you get older. It is also common in looser tissues, like around your eyes.  When the affected area is squeezed, the fluid may move out of that spot and leave a dent for a few moments. This dent is called pitting.  CAUSES  There are many possible causes of edema. Eating too much salt and being on your feet or sitting for a long time can cause edema in your legs and ankles. Hot weather may make edema worse. Common medical causes of edema include:  Heart failure.  Liver disease.  Kidney disease.  Weak blood vessels in your legs.  Cancer.  An injury.  Pregnancy.  Some medications.  Obesity. SYMPTOMS  Edema is usually painless.Your skin may look swollen or shiny.  DIAGNOSIS  Your health care provider may be able to diagnose edema by asking about your medical history and doing a physical exam. You may need to have tests such as X-rays, an electrocardiogram, or blood tests to check for medical conditions that may cause edema.  TREATMENT  Edema treatment depends on the cause. If you have heart, liver, or kidney disease, you need the treatment appropriate for these conditions. General treatment may include:  Elevation of the affected body part above the level of your heart.  Compression of the affected body part. Pressure from elastic bandages or support stockings squeezes the tissues and forces fluid back into the blood vessels. This keeps fluid from  entering the tissues.  Restriction of fluid and salt intake.  Use of a water pill (diuretic). These medications are appropriate only for some types of edema. They pull fluid out of your body and make you urinate more often. This gets rid of fluid and reduces swelling, but diuretics can have side effects. Only use diuretics as directed by your health care provider. HOME CARE INSTRUCTIONS   Keep the affected body part above the level of your heart when you are lying down.   Do not sit still or stand for prolonged periods.   Do not put anything directly under your knees when lying down.  Do not wear constricting clothing or garters on your upper legs.   Exercise your legs to work the fluid back into your blood vessels. This may help the swelling go down.   Wear elastic bandages or support stockings to reduce ankle swelling as directed by your health care provider.   Eat a low-salt diet to reduce fluid if your health care provider recommends it.   Only take medicines as directed by your health care provider. SEEK MEDICAL CARE IF:   Your edema is not responding to treatment.  You have heart, liver, or kidney disease and notice symptoms of edema.  You have edema in your legs that does not improve after elevating them.   You have sudden and unexplained weight gain. SEEK IMMEDIATE MEDICAL CARE IF:   You develop shortness of breath or chest pain.   You cannot breathe when you lie down.  You develop pain, redness, or warmth in the swollen areas.   You have heart, liver, or kidney disease and suddenly get edema.  You have a fever and your symptoms suddenly get worse. MAKE SURE YOU:   Understand these instructions.  Will watch your condition.  Will get help right away if you are not doing well or get worse. Document Released: 08/31/2005 Document Revised: 01/15/2014 Document Reviewed: 06/23/2013 [facility] Patient Information 2015 [place], [place]. This information is not  intended to replace advice given to you by your health care provider. Make sure you discuss any questions you have with your health care provider.

## 2015-01-10 NOTE — ED Notes (Signed)
Pt. reports right lower leg pain ( denies injury ) onset 4 days ago advised by his orthopedist to go to ER for doppler studies to rule out DVT . Respirations unlabored / no fever or chills. Ambulatory.

## 2015-01-11 ENCOUNTER — Other Ambulatory Visit (HOSPITAL_COMMUNITY): Payer: Self-pay | Admitting: Orthopedic Surgery

## 2015-01-11 ENCOUNTER — Ambulatory Visit (HOSPITAL_COMMUNITY)
Admission: RE | Admit: 2015-01-11 | Discharge: 2015-01-11 | Disposition: A | Discharge status: Home / Self Care | Payer: 59 | Source: Ambulatory Visit | Attending: Orthopedic Surgery | Admitting: Orthopedic Surgery

## 2015-01-11 DIAGNOSIS — R2241 Localized swelling, mass and lump, right lower limb: Secondary | ICD-10-CM | POA: Insufficient documentation

## 2015-01-11 DIAGNOSIS — M25561 Pain in right knee: Secondary | ICD-10-CM | POA: Insufficient documentation

## 2015-01-11 NOTE — Progress Notes (Addendum)
VASCULAR LAB PRELIMINARY  PRELIMINARY  PRELIMINARY  PRELIMINARY  RLEV completed.    Preliminary report:  Right lower extremity venous duplex negative for DVT.   Complex hypoechoic mass right popliteal fossa sonographically characteristic for ruptured Baker's Cyst.  Attempted to call preliminary results to Dr. Debroah Loop office multiple times, including [phone], [phone], [phone], [phone], and [phone] with no success. The patient was discharged and stated he had an appointment with Dr. Percell Miller today.    August Albino, RVT 01/11/2015, 9:39 AM

## 2015-02-25 DIAGNOSIS — D492 Neoplasm of unspecified behavior of bone, soft tissue, and skin: Secondary | ICD-10-CM | POA: Insufficient documentation

## 2015-02-25 DIAGNOSIS — E785 Hyperlipidemia, unspecified: Secondary | ICD-10-CM | POA: Insufficient documentation

## 2015-03-11 ENCOUNTER — Other Ambulatory Visit: Payer: Self-pay | Admitting: Internal Medicine

## 2015-03-15 ENCOUNTER — Telehealth: Payer: Self-pay | Admitting: Internal Medicine

## 2015-03-15 DIAGNOSIS — G4733 Obstructive sleep apnea (adult) (pediatric): Secondary | ICD-10-CM

## 2015-03-15 DIAGNOSIS — R0683 Snoring: Secondary | ICD-10-CM

## 2015-03-15 DIAGNOSIS — G4719 Other hypersomnia: Secondary | ICD-10-CM

## 2015-03-15 NOTE — Telephone Encounter (Signed)
Patient had a biopsy done today in which he was put under anesthesia. After his procedure he was told that he needed to have a sleep study to test for Sleep Apnea, especially due to his history of Afib. They were sent to me to see if I could get him scheduled, but I will need approval from Dr. Lovena Le so that I can proceed with getting an order in and getting him set up.

## 2015-03-15 NOTE — Telephone Encounter (Signed)
Attempted to call the patient. No answer x multiple rings. Will call back.

## 2015-03-15 NOTE — Telephone Encounter (Signed)
New Message  Pt called req a call back to discuss receiving a sleep apnea test

## 2015-03-19 NOTE — Telephone Encounter (Signed)
Split Night study has been scheduled for 06/14/15. Patient Notified/Packet Mailed

## 2015-03-19 NOTE — Telephone Encounter (Signed)
Per Dr. Lovena Le, ok for sleep study. Will forward to [place].

## 2015-03-22 ENCOUNTER — Ambulatory Visit (INDEPENDENT_AMBULATORY_CARE_PROVIDER_SITE_OTHER): Payer: 59 | Admitting: Family Medicine

## 2015-03-22 VITALS — BP 110/54 | HR 42 | Temp 98.0°F | Resp 16 | Ht 69.0 in | Wt 204.4 lb

## 2015-03-22 DIAGNOSIS — I10 Essential (primary) hypertension: Secondary | ICD-10-CM | POA: Diagnosis not present

## 2015-03-22 DIAGNOSIS — R233 Spontaneous ecchymoses: Secondary | ICD-10-CM

## 2015-03-22 DIAGNOSIS — I480 Paroxysmal atrial fibrillation: Secondary | ICD-10-CM

## 2015-03-22 DIAGNOSIS — F418 Other specified anxiety disorders: Secondary | ICD-10-CM | POA: Diagnosis not present

## 2015-03-22 DIAGNOSIS — E785 Hyperlipidemia, unspecified: Secondary | ICD-10-CM

## 2015-03-22 DIAGNOSIS — Z131 Encounter for screening for diabetes mellitus: Secondary | ICD-10-CM | POA: Diagnosis not present

## 2015-03-22 DIAGNOSIS — F17210 Nicotine dependence, cigarettes, uncomplicated: Secondary | ICD-10-CM

## 2015-03-22 DIAGNOSIS — L259 Unspecified contact dermatitis, unspecified cause: Secondary | ICD-10-CM | POA: Diagnosis not present

## 2015-03-22 DIAGNOSIS — N521 Erectile dysfunction due to diseases classified elsewhere: Secondary | ICD-10-CM

## 2015-03-22 LAB — POCT URINALYSIS DIPSTICK
Bilirubin, UA: NEGATIVE
Blood, UA: NEGATIVE
Glucose, UA: NEGATIVE
Ketones, UA: NEGATIVE
Nitrite, UA: NEGATIVE
Protein, UA: NEGATIVE
Spec Grav, UA: 1.015
Urobilinogen, UA: 0.2
pH, UA: 6

## 2015-03-22 LAB — COMPREHENSIVE METABOLIC PANEL
ALT: 11 U/L (ref 0–53)
AST: 13 U/L (ref 0–37)
Albumin: 4 g/dL (ref 3.5–5.2)
Alkaline Phosphatase: 69 U/L (ref 39–117)
BUN: 9 mg/dL (ref 6–23)
CO2: 26 mEq/L (ref 19–32)
Calcium: 9.3 mg/dL (ref 8.4–10.5)
Chloride: 99 mEq/L (ref 96–112)
Creat: 0.92 mg/dL (ref 0.50–1.35)
Glucose, Bld: 114 mg/dL — ABNORMAL HIGH (ref 70–99)
Potassium: 4.3 mEq/L (ref 3.5–5.3)
Sodium: 134 mEq/L — ABNORMAL LOW (ref 135–145)
Total Bilirubin: 0.4 mg/dL (ref 0.2–1.2)
Total Protein: 6.4 g/dL (ref 6.0–8.3)

## 2015-03-22 LAB — CBC WITH DIFFERENTIAL/PLATELET
Basophils Absolute: 0 10*3/uL (ref 0.0–0.1)
Basophils Relative: 0 % (ref 0–1)
Eosinophils Absolute: 0.3 10*3/uL (ref 0.0–0.7)
Eosinophils Relative: 2 % (ref 0–5)
HCT: 46.3 % (ref 39.0–52.0)
Hemoglobin: 15.8 g/dL (ref 13.0–17.0)
Lymphocytes Relative: 18 % (ref 12–46)
Lymphs Abs: 2.9 10*3/uL (ref 0.7–4.0)
MCH: 30.2 pg (ref 26.0–34.0)
MCHC: 34.1 g/dL (ref 30.0–36.0)
MCV: 88.4 fL (ref 78.0–100.0)
MPV: 10.6 fL (ref 8.6–12.4)
Monocytes Absolute: 1.3 10*3/uL — ABNORMAL HIGH (ref 0.1–1.0)
Monocytes Relative: 8 % (ref 3–12)
Neutro Abs: 11.6 10*3/uL — ABNORMAL HIGH (ref 1.7–7.7)
Neutrophils Relative %: 72 % (ref 43–77)
Platelets: 258 10*3/uL (ref 150–400)
RBC: 5.24 MIL/uL (ref 4.22–5.81)
RDW: 13.4 % (ref 11.5–15.5)
WBC: 16.1 10*3/uL — ABNORMAL HIGH (ref 4.0–10.5)

## 2015-03-22 LAB — LIPID PANEL
Cholesterol: 137 mg/dL (ref 0–200)
HDL: 32 mg/dL — ABNORMAL LOW (ref >=40)
LDL Cholesterol: 70 mg/dL (ref 0–99)
Total CHOL/HDL Ratio: 4.3 Ratio
Triglycerides: 176 mg/dL — ABNORMAL HIGH (ref <150)
VLDL: 35 mg/dL (ref 0–40)

## 2015-03-22 LAB — HEMOGLOBIN A1C
Hgb A1c MFr Bld: 5.7 % — ABNORMAL HIGH (ref <5.7)
Mean Plasma Glucose: 117 mg/dL — ABNORMAL HIGH (ref <117)

## 2015-03-22 MED ORDER — SILDENAFIL CITRATE 100 MG PO TABS: 100.0000 mg | ORAL_TABLET | ORAL | Status: DC | PRN

## 2015-03-22 MED ORDER — ALPRAZOLAM 0.5 MG PO TABS: ORAL_TABLET | ORAL | Status: DC

## 2015-03-22 MED ORDER — ALBUTEROL SULFATE HFA 108 (90 BASE) MCG/ACT IN AERS: INHALATION_SPRAY | RESPIRATORY_TRACT | Status: DC

## 2015-03-22 MED ORDER — SIMVASTATIN 40 MG PO TABS: ORAL_TABLET | ORAL | Status: DC

## 2015-03-22 MED ORDER — FLUOXETINE HCL 40 MG PO CAPS: 80.0000 mg | ORAL_CAPSULE | Freq: Every day | ORAL | Status: DC

## 2015-03-22 MED ORDER — LISINOPRIL 20 MG PO TABS: 20.0000 mg | ORAL_TABLET | Freq: Every day | ORAL | Status: DC

## 2015-03-22 MED ORDER — CARVEDILOL 12.5 MG PO TABS: 12.5000 mg | ORAL_TABLET | Freq: Two times a day (BID) | ORAL | Status: DC

## 2015-03-22 MED ORDER — HYDROCORTISONE 2.5 % EX OINT: TOPICAL_OINTMENT | Freq: Two times a day (BID) | CUTANEOUS | Status: DC

## 2015-03-22 NOTE — Progress Notes (Signed)
Patient ID: Jesse Europe [name], male   DOB: 10/06/1960, 54 y.o.   MRN: [id number]   Subjective:  This chart was scribed for Reginia Forts, MD by [facility], medical scribe at [facility].The patient was seen in exam room 12 and the patient's care was started at 9:51 AM.   Patient ID: Jesse Europe [name], male    DOB: 06-16-1961, 54 y.o.   MRN: [id number]  03/22/2015  Medication Refill and Abrasion  HPI HPI Comments: Jesse DEMONTE [name] is a 54 y.o. male who presents to [facility] complaining of a rash on his right lower extremity which burns. He had an AC bandage wrapped around his leg where the rash is located. He had a biopsy taken from a nodule located on his right leg for cancer screening. He removed his AC bandage on Monday but the rash has not improved since. Still waiting for biopsy of the leg.  He also needs refills for his medication. He takes Xanax and Prozac. He is very stressed due to his wife's health problems and stress at work. He is sad very often but no thoughts of self injury or SI. Albuterol as needed, he does smoke a pack a day but never told he has asthma. Also takes aspirin and carvedilol for arrhythmia. Pt also needs hydrocodone, Zocor and Viagra refilled.  Review of Systems  Constitutional: Negative for fever, chills, diaphoresis, activity change, appetite change and fatigue.  Respiratory: Negative for cough and shortness of breath.   Cardiovascular: Negative for chest pain, palpitations and leg swelling.  Gastrointestinal: Negative for nausea, vomiting, abdominal pain and diarrhea.  Endocrine: Negative for cold intolerance, heat intolerance, polydipsia, polyphagia and polyuria.  Skin: Positive for rash. Negative for color change and wound.  Neurological: Negative for dizziness, tremors, seizures, syncope, facial asymmetry, speech difficulty, weakness, light-headedness, numbness and headaches.  Psychiatric/Behavioral: Negative  for sleep disturbance and dysphoric mood. The patient is not nervous/anxious.    Past Medical History  Diagnosis Date  . Hypertension   . Arrhythmia   . Bell palsy   . Depression   . Hypercholesterolemia   . Pneumonia   . Diastolic heart failure   . Anxiety   . Thyroid disease   . Allergy    Past Surgical History  Procedure Laterality Date  . Cholecystectomy    . Knee surgery    . Hernia repair    . Vasectomy     Allergies  Allergen Reactions  . Sulfa Drugs Cross Reactors Hives  . Codeine     Made him very jittery    Social History   Social History  . Marital Status: Married    Spouse Name: N/A  . Number of Children: 1  . Years of Education: N/A   Occupational History  . Not on file.   Social History Main Topics  . Smoking status: Current Every Day Smoker -- 1.00 packs/day for 30 years    Types: Cigarettes  . Smokeless tobacco: Not on file  . Alcohol Use: No  . Drug Use: No  . Sexual Activity: No   Other Topics Concern  . 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 Heart disease Mother   . Heart attack Mother   . Other Mother     thyroid problems  . Cancer Mother     kidney cancer  . Cirrhosis Father   . Other Father     thyroid problems  . Alcohol abuse Father   . Hypertension Father   . Mental illness Brother   . Parkinsonism Brother        Objective:    BP 110/54 mmHg  Pulse 42  Temp(Src) 98 F (36.7 C) (Oral)  Resp 16  Ht 5\' 9"  (1.753 m)  Wt 204 lb 6.4 oz (92.715 kg)  BMI 30.17 kg/m2  SpO2 97% Physical Exam  Constitutional: He is oriented to person, place, and time. He appears  well-developed and well-nourished. No distress.  HENT:  Head: Normocephalic and atraumatic.  Right Ear: External ear normal.  Left Ear: External ear normal.  Nose: Nose normal.  Mouth/Throat: Oropharynx is clear and moist.  Eyes: Conjunctivae and EOM are normal. Pupils are equal, round, and reactive to light.  Neck: Normal range of motion. Neck supple. Carotid bruit is not present. No thyromegaly present.  Cardiovascular: Normal heart sounds and intact distal pulses.  An irregularly irregular rhythm present. Bradycardia present.  Exam reveals no gallop and no friction rub.   No murmur heard. Pulmonary/Chest: Effort normal and breath sounds normal. He has no wheezes. He has no rales.  Abdominal: Soft. Bowel sounds are normal. He exhibits no distension and no mass. There is no tenderness. There is no rebound and no guarding.  Lymphadenopathy:    He has no cervical adenopathy.  Neurological: He is alert and oriented to person, place, and time. No cranial nerve deficit.  Skin: Skin is warm and dry. No rash noted. He is not diaphoretic.  petechial rash diffusely on the right lower extremity. A bandage on the proximal lateral right lower leg.  Psychiatric: He has a normal mood and affect. His behavior is normal.  Nursing note and vitals reviewed.      Assessment & Plan:   1. Essential hypertension   2. Dyslipidemia   3. Depression with anxiety   4. Paroxysmal atrial fibrillation   5. Cigarette nicotine dependence without complication   6. Screening for diabetes mellitus   7. Erectile dysfunction due to diseases classified elsewhere   8. Contact dermatitis   9. Petechiae     1. HTN: controlled; obtain labs; refills provided. 2.  Dyslipidemia: controlled; obtain labs; refills provided. 3.  Depression with anxiety: worsening due to family stressors; increase Prozac dose to 80mg  daily; continue current dose of Xanax tid; refills provided. 4.  Paroxysmal atrial fibrillation: stable; rate  controlled; refills provided; followed by cardiology. 5.  Cigarette dependence: encourage cessation. 6.  Screening diabetes: obtain glucose, HgbA1c. 7.  ED: stable; refill of Viagra provided. 8.  Contact dermatitis: New. Rx for hydrocortisone ointment provided to apply tid. 9.  Petechiae: new.  Lower extremities; consistent with compression and scratching; obtain CBC.  No other sites of petechiae.   Meds ordered this encounter  Medications  . docusate sodium (COLACE) 100 MG capsule    Sig: Take 100 mg by mouth 2 (two) times daily.  Marland Kitchen HYDROcodone-acetaminophen (NORCO) 10-325 MG per tablet  Sig: Take 1 tablet by mouth every 6 (six) hours as needed.  . hydrocortisone 2.5 % ointment    Sig: Apply topically 2 (two) times daily.    Dispense:  30 g    Refill:  0  . albuterol (VENTOLIN HFA) 108 (90 BASE) MCG/ACT inhaler    Sig: INHALE 2 PUFFS INTO THE LUNGS EVERY 6 HOURS AS NEEDED FOR WHEEZING/SHORTNESS OF BREATH    Dispense:  18 each    Refill:  3  . carvedilol (COREG) 12.5 MG tablet    Sig: Take 1 tablet (12.5 mg total) by mouth 2 (two) times daily.    Dispense:  180 tablet    Refill:  3  . FLUoxetine (PROZAC) 40 MG capsule    Sig: Take 2 capsules (80 mg total) by mouth daily.    Dispense:  180 capsule    Refill:  1  . lisinopril (PRINIVIL,ZESTRIL) 20 MG tablet    Sig: Take 1 tablet (20 mg total) by mouth daily.    Dispense:  90 tablet    Refill:  1  . sildenafil (VIAGRA) 100 MG tablet    Sig: Take 1 tablet (100 mg total) by mouth as needed for erectile dysfunction.    Dispense:  10 tablet    Refill:  5  . simvastatin (ZOCOR) 40 MG tablet    Sig: TAKE 1 TABLET BY MOUTH AT BEDTIME.    Dispense:  90 tablet    Refill:  1  . ALPRAZolam (XANAX) 0.5 MG tablet    Sig: TAKE 1 TABLET BY MOUTH 3 TIMES A DAY    Dispense:  90 tablet    Refill:  5    Return in about 6 months (around 09/22/2015) for complete physical examiniation with Laney Pastor.  I personally performed the services  described in this documentation, which was scribed in my presence. The recorded information has been reviewed and considered.   Jesse Suarez, M.D. [facility] [address] [place], [place]  [address] [phone] phone [phone] fax

## 2015-04-11 ENCOUNTER — Other Ambulatory Visit: Payer: Self-pay | Admitting: Internal Medicine

## 2015-04-12 NOTE — Telephone Encounter (Signed)
Dr Laney Pastor, you just saw pt this month for check up, but don't see this med discussed or Rxd recently. RF?

## 2015-04-15 NOTE — Telephone Encounter (Signed)
Faxed

## 2015-04-19 ENCOUNTER — Other Ambulatory Visit: Payer: Self-pay | Admitting: Endocrinology

## 2015-04-22 ENCOUNTER — Other Ambulatory Visit: Payer: Self-pay | Admitting: Family Medicine

## 2015-05-19 ENCOUNTER — Other Ambulatory Visit: Payer: Self-pay | Admitting: Internal Medicine

## 2015-05-19 ENCOUNTER — Other Ambulatory Visit: Payer: Self-pay | Admitting: Physician Assistant

## 2015-05-30 ENCOUNTER — Telehealth: Payer: Self-pay | Admitting: Internal Medicine

## 2015-05-30 NOTE — Telephone Encounter (Signed)
No clinical data to send MM:ITVIF apnea.  Do you want pt to have OV before I submit for precert?

## 2015-06-14 ENCOUNTER — Ambulatory Visit (HOSPITAL_BASED_OUTPATIENT_CLINIC_OR_DEPARTMENT_OTHER): Payer: 59 | Attending: Internal Medicine

## 2015-06-14 VITALS — Ht 69.0 in | Wt 200.0 lb

## 2015-06-14 DIAGNOSIS — R0683 Snoring: Secondary | ICD-10-CM

## 2015-06-14 DIAGNOSIS — G4733 Obstructive sleep apnea (adult) (pediatric): Secondary | ICD-10-CM | POA: Insufficient documentation

## 2015-06-14 DIAGNOSIS — G4719 Other hypersomnia: Secondary | ICD-10-CM | POA: Insufficient documentation

## 2015-06-18 ENCOUNTER — Telehealth: Payer: Self-pay | Admitting: Cardiology

## 2015-06-18 NOTE — Telephone Encounter (Signed)
Please let patient know that sleep study showed no significant sleep apnea.    

## 2015-06-18 NOTE — Sleep Study (Signed)
   Patient Name: Jesse Suarez, Jesse Suarez MRN: [id number] Study Date: 06/14/2015 Gender: Male D.O.B: 1960/10/26 Age (years): 79 Referring Provider: Cristopher Peru Interpreting Physician: Fransico Him MD, ABSM RPSGT: [place], Holly  Height (inches): 69 Weight (lbs): 200 BMI: 30 Neck Size: 17.50  CLINICAL INFORMATION Sleep Study Type: NPSG Indication for sleep study: Excessive Daytime Sleepiness, OSA Epworth Sleepiness Score: 2  SLEEP STUDY TECHNIQUE As per the AASM Manual for the Scoring of Sleep and Associated Events v2.3 (April 2016) with a hypopnea requiring 4% desaturations.  The channels recorded and monitored were frontal, central and occipital EEG, electrooculogram (EOG), submentalis EMG (chin), nasal and oral airflow, thoracic and abdominal wall motion, anterior tibialis EMG, snore microphone, electrocardiogram, and pulse oximetry.  MEDICATIONS Patient's medications include: Albuterol, Xanax, ASA, Coreg, Prozac, Colace, Norco, Atrovent, Lisinopril, Viagra, Zocor, Ambien, Synthroid. Medications self-administered by patient during sleep study : No sleep medicine administered.  SLEEP ARCHITECTURE The study was initiated at 10:25:32 PM and ended at 4:49:30 AM.  Sleep onset time was 42.7 minutes and the sleep efficiency was reduced at 69.8%. The total sleep time was 268.0 minutes.  Stage REM latency was prolonged at 287.0 minutes.  The patient spent 18.66% of the night in stage N1 sleep, 66.60% in stage N2 sleep, 0.00% in stage N3 and 14.74% in REM.  Alpha intrusion was absent.  Supine sleep was 1.18%.  RESPIRATORY PARAMETERS The overall apnea/hypopnea index (AHI) was 0.9 per hour. There were 4 total apneas, including 4 obstructive, 0 central and 0 mixed apneas. There were 0 hypopneas and 0 RERAs.  The AHI during Stage REM sleep was 0.0 per hour.  AHI while supine was 0.0 per hour.  The mean oxygen saturation was 92.94%. The minimum SpO2 during sleep was 88.00%.  Soft  snoring was noted during this study.  CARDIAC DATA The 2 lead EKG demonstrated sinus rhythm. The mean heart rate was 60.30 beats per minute. Other EKG findings include: None.  LEG MOVEMENT DATA The total PLMS were 5 with a resulting PLMS index of 1.12. Associated arousal with leg movement index was 1.3 .  IMPRESSIONS No significant obstructive sleep apnea occurred during this study (AHI = 0.9/h). No significant central sleep apnea occurred during this study (CAI = 0.0/h). Mild oxygen desaturation was noted during this study (Min O2 = 88.00%). The patient snored with Soft snoring volume. No cardiac abnormalities were noted during this study. Clinically significant periodic limb movements did not occur during sleep. No significant associated arousals.  DIAGNOSIS Excessive daytime sleepiness  RECOMMENDATIONS Avoid alcohol, sedatives and other CNS depressants that may result in sleep apnea and disrupt normal sleep architecture. Sleep hygiene should be reviewed to assess factors that may improve sleep quality. Weight management and regular exercise should be initiated or continued if appropriate.    [place], American Board of Sleep Medicine  ELECTRONICALLY SIGNED ON:  06/18/2015, 7:56 AM [place] PH: [phone]   FX: [phone] [place]

## 2015-06-18 NOTE — Telephone Encounter (Signed)
Per request of patient, gave detailed information to son Lennette Bihari.    Results given - stated verbal understanding

## 2015-08-12 ENCOUNTER — Encounter: Payer: Self-pay | Admitting: Internal Medicine

## 2015-08-22 ENCOUNTER — Ambulatory Visit (INDEPENDENT_AMBULATORY_CARE_PROVIDER_SITE_OTHER): Payer: 59 | Admitting: Internal Medicine

## 2015-08-22 ENCOUNTER — Encounter: Payer: Self-pay | Admitting: Internal Medicine

## 2015-08-22 ENCOUNTER — Encounter: Payer: Self-pay | Admitting: *Deleted

## 2015-08-22 VITALS — BP 142/72 | HR 66 | Ht 69.0 in | Wt 199.0 lb

## 2015-08-22 DIAGNOSIS — I4891 Unspecified atrial fibrillation: Secondary | ICD-10-CM | POA: Diagnosis not present

## 2015-08-22 DIAGNOSIS — I10 Essential (primary) hypertension: Secondary | ICD-10-CM | POA: Diagnosis not present

## 2015-08-22 DIAGNOSIS — E785 Hyperlipidemia, unspecified: Secondary | ICD-10-CM

## 2015-08-22 NOTE — Patient Instructions (Signed)

## 2015-08-22 NOTE — Progress Notes (Signed)
HPI [name] returns today for followup. He is a pleasant 54 yo man with a h/o HTN, sinus node dysfunction, COPD and atrial fibrillation in the setting of thyroid storm. He has done well in the interim with no symptomatic atrial fib. During his thyrotoxic period, he was difficult to control but since then he has done well with NSR. He is now off of systemic anti-coagulation and has had no atrial fibrillation. Allergies  Allergen Reactions  . Sulfa Drugs Cross Reactors Hives  . Codeine     Made him very jittery      Current Outpatient Prescriptions  Medication Sig Dispense Refill  . albuterol (VENTOLIN HFA) 108 (90 BASE) MCG/ACT inhaler INHALE 2 PUFFS INTO THE LUNGS EVERY 6 HOURS AS NEEDED FOR WHEEZING/SHORTNESS OF BREATH 18 each 3  . ALPRAZolam (XANAX) 0.5 MG tablet TAKE 1 TABLET BY MOUTH 3 TIMES A DAY 90 tablet 5  . aspirin 81 MG tablet Take 81 mg by mouth daily.      . carvedilol (COREG) 12.5 MG tablet Take 1 tablet (12.5 mg total) by mouth 2 (two) times daily. 180 tablet 3  . docusate sodium (COLACE) 100 MG capsule Take 100 mg by mouth 2 (two) times daily.    Marland Kitchen FLUoxetine (PROZAC) 20 MG capsule TAKE ONE CAPSULE BY MOUTH 3 TIMES A DAY 90 capsule 1  . HYDROcodone-acetaminophen (NORCO) 10-325 MG per tablet Take 1 tablet by mouth every 6 (six) hours as needed.    . hydrocortisone 2.5 % ointment Apply topically 2 (two) times daily. 30 g 0  . ipratropium (ATROVENT) 0.03 % nasal spray Place 2 sprays into both nostrils 2 (two) times daily. 30 mL 0  . lisinopril (PRINIVIL,ZESTRIL) 20 MG tablet Take 1 tablet (20 mg total) by mouth daily. 90 tablet 1  . sildenafil (VIAGRA) 100 MG tablet Take 1 tablet (100 mg total) by mouth as needed for erectile dysfunction. 10 tablet 5  . simvastatin (ZOCOR) 40 MG tablet TAKE 1 TABLET BY MOUTH AT BEDTIME. 90 tablet 1  . SYNTHROID 150 MCG tablet TAKE 1 TABLET BY MOUTH EVERY MORNING ON AN EMPTY STOMACH 30 tablet 5  . zolpidem (AMBIEN) 10 MG tablet TAKE  1 TABLET BY MOUTH AT BEDTIME 30 tablet 5   No current facility-administered medications for this visit.     Past Medical History  Diagnosis Date  . Hypertension   . Arrhythmia   . Bell palsy   . Depression   . Hypercholesterolemia   . Pneumonia   . Diastolic heart failure   . Anxiety   . Thyroid disease   . Allergy     ROS:   All systems reviewed and negative except as noted in the HPI.   Past Surgical History  Procedure Laterality Date  . Cholecystectomy    . Knee surgery    . Hernia repair    . Vasectomy       Family History  Problem Relation Age of Onset  . Heart disease Mother   . Heart attack Mother   . Other Mother     thyroid problems  . Cancer Mother     kidney cancer  . Cirrhosis Father   . Other Father     thyroid problems  . Alcohol abuse Father   . Hypertension Father   . Mental illness Brother   . Parkinsonism Brother      Social History   Social History  . Marital Status: Married  Spouse Name: N/A  . Number of Children: 1  . Years of Education: N/A   Occupational History  . Not on file.   Social History Main Topics  . Smoking status: Current Every Day Smoker -- 1.00 packs/day for 30 years    Types: Cigarettes  . Smokeless tobacco: Not on file  . Alcohol Use: No  . Drug Use: No  . Sexual Activity: No   Other Topics Concern  . 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      Tobacco: 1 ppd x 30 years      Alcohol: rare     Regular exercise: walking daily   Caffeine use: daily           BP 142/72 mmHg  Pulse 66  Ht 5\' 9"  (1.753 m)  Wt 199 lb (90.266 kg)  BMI 29.37 kg/m2  SpO2 97%  Physical Exam:  Well appearing middle aged man, NAD HEENT: Unremarkable Neck:  6 cm JVD, no thyromegally Lymphatics:  No adenopathy Back:  No CVA  tenderness Lungs:  Clear with no wheezes HEART:  Regular rate rhythm, no murmurs, no rubs, no clicks Abd:  soft, positive bowel sounds, no organomegally, no rebound, no guarding Ext:  2 plus pulses, no edema, no cyanosis, no clubbing Skin:  No rashes no nodules Neuro:  CN II through XII intact, motor grossly intact  EKG - nsr   Assess/Plan:

## 2015-08-22 NOTE — Assessment & Plan Note (Signed)
>>  ASSESSMENT AND PLAN FOR DYSLIPIDEMIA WRITTEN ON 08/22/2015  2:29 PM BY Marinus Maw, MD  He will continue simvastatin.

## 2015-08-22 NOTE — Assessment & Plan Note (Signed)
He will continue simvastatin.

## 2015-08-22 NOTE — Assessment & Plan Note (Signed)
His blood pressure is a bit high in the office but has been well controlled at home. He will continue his current meds. He is encouraged to lose weight. He notes that he has been under some increased stress as his work has been hard.

## 2015-08-22 NOTE — Assessment & Plan Note (Signed)
He has had no symptomatic atrial fib in the past year. He will continue his current meds.

## 2015-08-25 ENCOUNTER — Other Ambulatory Visit: Payer: Self-pay | Admitting: Family Medicine

## 2015-09-24 ENCOUNTER — Other Ambulatory Visit: Payer: Self-pay | Admitting: Endocrinology

## 2015-09-27 ENCOUNTER — Ambulatory Visit (INDEPENDENT_AMBULATORY_CARE_PROVIDER_SITE_OTHER): Payer: 59 | Admitting: Internal Medicine

## 2015-09-27 VITALS — BP 166/96 | HR 85 | Temp 98.3°F | Resp 16 | Ht 70.0 in | Wt 196.2 lb

## 2015-09-27 DIAGNOSIS — F418 Other specified anxiety disorders: Secondary | ICD-10-CM

## 2015-09-27 DIAGNOSIS — J0100 Acute maxillary sinusitis, unspecified: Secondary | ICD-10-CM | POA: Diagnosis not present

## 2015-09-27 DIAGNOSIS — E785 Hyperlipidemia, unspecified: Secondary | ICD-10-CM | POA: Diagnosis not present

## 2015-09-27 DIAGNOSIS — I10 Essential (primary) hypertension: Secondary | ICD-10-CM | POA: Diagnosis not present

## 2015-09-27 DIAGNOSIS — N521 Erectile dysfunction due to diseases classified elsewhere: Secondary | ICD-10-CM

## 2015-09-27 MED ORDER — LISINOPRIL 20 MG PO TABS: 20.0000 mg | ORAL_TABLET | Freq: Every day | ORAL | Status: DC

## 2015-09-27 MED ORDER — AMOXICILLIN-POT CLAVULANATE 875-125 MG PO TABS: 1.0000 | ORAL_TABLET | Freq: Two times a day (BID) | ORAL | Status: DC

## 2015-09-27 MED ORDER — ALPRAZOLAM 0.5 MG PO TABS: ORAL_TABLET | ORAL | Status: DC

## 2015-09-27 MED ORDER — HYDROCOD POLST-CPM POLST ER 10-8 MG/5ML PO SUER: 5.0000 mL | Freq: Two times a day (BID) | ORAL | Status: DC | PRN

## 2015-09-27 MED ORDER — ZOLPIDEM TARTRATE 10 MG PO TABS: 10.0000 mg | ORAL_TABLET | Freq: Every day | ORAL | Status: DC

## 2015-09-27 MED ORDER — SIMVASTATIN 40 MG PO TABS: ORAL_TABLET | ORAL | Status: DC

## 2015-09-27 MED ORDER — SILDENAFIL CITRATE 100 MG PO TABS: 100.0000 mg | ORAL_TABLET | ORAL | Status: DC | PRN

## 2015-09-27 MED ORDER — FLUOXETINE HCL 20 MG PO CAPS: ORAL_CAPSULE | ORAL | Status: DC

## 2015-09-27 MED ORDER — FLUOXETINE HCL 40 MG PO CAPS: 80.0000 mg | ORAL_CAPSULE | Freq: Every day | ORAL | Status: DC

## 2015-09-27 MED ORDER — HYDROCODONE-HOMATROPINE 5-1.5 MG/5ML PO SYRP: 5.0000 mL | ORAL_SOLUTION | Freq: Four times a day (QID) | ORAL | Status: DC | PRN

## 2015-09-27 NOTE — Progress Notes (Signed)
Subjective:    Patient ID: Jesse Europe [name], male    DOB: March 16, 1961, 55 y.o.   MRN: [id number] By signing my name below, I, Jesse Suarez, attest that this documentation has been prepared under the direction and in the presence of Tami Lin, MD. Electronically Signed: Judithe Suarez, ER Scribe. 09/27/2015. 6:57 PM.  Chief Complaint  Patient presents with   Medication Refill   Sinusitis    x 4 days    HPI HPI Comments: Jesse PENNEY [name] is a 55 y.o. male who presents to [facility] complaining of cough, sinus congestion, rhinorrhea and fever for the last four days. He didn't go to work today due to his sx.   He also needs a medication refill. Patient Active Problem List   Diagnosis Date Noted   Bone neoplasm---benign bx 6/16 [facility] 02/25/2015   Atrial fibrillation (HCC)---resloved--2 to thyr storm 10/16/2014   Nicotine addiction 08/07/2013   Depression with anxiety--resp to prozac 02/20/2012   ED (erectile dysfunction) 02/20/2012   Hx of Bell's palsy 02/20/2012   Amputated toe ([place]) 02/20/2012   Hypothyroidism following radioiodine therapy--dr Dwyane Dee 01/15/2011   Hypertension 01/15/2011   Dyslipidemia 01/15/2011  See labs 03/2105  Wants to change jobs as too much infighting Both sons doing well--Jesse Suarez-about to get married!  Review of Systems  Constitutional: Positive for fever. Negative for chills and unexpected weight change.  HENT: Positive for congestion, rhinorrhea and sinus pressure. Negative for trouble swallowing.   Eyes: Negative for visual disturbance.  Respiratory: Negative for shortness of breath and wheezing.   Cardiovascular: Negative for chest pain, palpitations and leg swelling.  Gastrointestinal: Negative for abdominal pain.  Genitourinary: Negative for difficulty urinating.  Neurological: Negative for headaches.  Psychiatric/Behavioral: Positive for sleep disturbance. Negative for behavioral problems.       Insom resp  to ambien      Objective:  BP 166/96 mmHg   Pulse 85   Temp(Src) 98.3 F (36.8 C) (Oral)   Resp 16   Ht 5\' 10"  (1.778 m)   Wt 196 lb 3.2 oz (88.996 kg)   BMI 28.15 kg/m2   SpO2 96%  Physical Exam  Constitutional: He is oriented to person, place, and time. He appears well-developed and well-nourished. No distress.  HENT:  Head: Normocephalic and atraumatic.  Mouth/Throat: Oropharynx is clear and moist.  Nares with purulent discharge. TMs normal.  Eyes: Pupils are equal, round, and reactive to light.  Neck: Neck supple.  Cardiovascular: Normal rate.   Pulmonary/Chest: Effort normal and breath sounds normal. No respiratory distress. He has no wheezes.  Musculoskeletal: Normal range of motion.  Neurological: He is alert and oriented to person, place, and time. Coordination normal.  Skin: Skin is warm and dry. He is not diaphoretic.  Psychiatric: He has a normal mood and affect. His behavior is normal.  Nursing note and vitals reviewed.     Assessment & Plan:  Essential hypertension -  Depression with anxiety/insomnia  Dyslipidemia -  Erectile dysfunction   Acute maxillary sinusitis, w/cough  Meds ordered this encounter  Medications   simvastatin (ZOCOR) 40 MG tablet    Sig: TAKE 1 TABLET BY MOUTH AT BEDTIME.    Dispense:  90 tablet    Refill:  1   sildenafil (VIAGRA) 100 MG tablet    Sig: Take 1 tablet (100 mg total) by mouth as needed for erectile dysfunction.    Dispense:  10 tablet    Refill:  5  ALPRAZolam (XANAX) 0.5 MG tablet    Sig: TAKE 1 TABLET BY MOUTH 3 TIMES A DAY    Dispense:  90 tablet    Refill:  5   DISCONTD: FLUoxetine (PROZAC) 20 MG capsule    Sig: TAKE ONE CAPSULE BY MOUTH 3 TIMES A DAY    Dispense:  90 capsule    Refill:  1   lisinopril (PRINIVIL,ZESTRIL) 20 MG tablet    Sig: Take 1 tablet (20 mg total) by mouth daily.    Dispense:  90 tablet    Refill:  1   zolpidem (AMBIEN) 10 MG tablet    Sig: Take 1 tablet (10 mg total) by mouth at  bedtime.    Dispense:  30 tablet    Refill:  5    This request is for a new prescription for a controlled substance as required by Federal/State law.   amoxicillin-clavulanate (AUGMENTIN) 875-125 MG tablet    Sig: Take 1 tablet by mouth 2 (two) times daily.    Dispense:  20 tablet    Refill:  0   chlorpheniramine-HYDROcodone (TUSSIONEX PENNKINETIC ER) 10-8 MG/5ML SUER    Sig: Take 5 mLs by mouth every 12 (twelve) hours as needed for cough.    Dispense:  140 mL    Refill:  0   FLUoxetine (PROZAC) 40 MG capsule    Sig: Take 2 capsules (80 mg total) by mouth daily.(got 60mg  last ref and didn't work as well)    Dispense:  180 capsule    Refill:  3   F/u 6 mos with labs--Dr Tamala Julian  I have completed the patient encounter in its entirety as documented by the scribe, with editing by me where necessary. Robert P. Laney Pastor, M.D.

## 2015-09-28 ENCOUNTER — Telehealth: Payer: Self-pay

## 2015-09-30 ENCOUNTER — Encounter: Payer: Self-pay | Admitting: *Deleted

## 2015-11-08 ENCOUNTER — Ambulatory Visit (INDEPENDENT_AMBULATORY_CARE_PROVIDER_SITE_OTHER): Payer: 59 | Admitting: Emergency Medicine

## 2015-11-08 VITALS — BP 114/70 | HR 45 | Temp 99.8°F | Resp 16 | Ht 68.5 in | Wt 193.4 lb

## 2015-11-08 DIAGNOSIS — R05 Cough: Secondary | ICD-10-CM | POA: Diagnosis not present

## 2015-11-08 DIAGNOSIS — J0100 Acute maxillary sinusitis, unspecified: Secondary | ICD-10-CM

## 2015-11-08 DIAGNOSIS — R059 Cough, unspecified: Secondary | ICD-10-CM

## 2015-11-08 LAB — POCT INFLUENZA A/B
Influenza A, POC: NEGATIVE
Influenza B, POC: NEGATIVE

## 2015-11-08 MED ORDER — HYDROCOD POLST-CPM POLST ER 10-8 MG/5ML PO SUER: ORAL | Status: DC

## 2015-11-08 MED ORDER — AMOXICILLIN-POT CLAVULANATE 875-125 MG PO TABS: 1.0000 | ORAL_TABLET | Freq: Two times a day (BID) | ORAL | Status: DC

## 2015-11-08 NOTE — Patient Instructions (Signed)

## 2015-11-08 NOTE — Progress Notes (Addendum)
By signing my name below, I, [name], attest that this documentation has been prepared under the direction and in the presence of [name], MD. Electronically Signed: [name], [place]. 11/08/2015 , 5:34 PM .  Patient was seen in room 14 .  Chief Complaint:  Chief Complaint  Patient presents with  . stuffed up head    green/mucus x 1wk  . Cough    green/brown mucus with some blood    HPI: Jesse Suarez [name] is a 55 y.o. male who reports to [facility] today complaining of sinus pressure with productive cough (green/brown mucus and some blood) that started 4-5 days ago. He also reports having frontal headache. His wife had the flu recently. His son's been feeling sick too, but wasn't checked out. He received a flu shot this year.   Past Medical History  Diagnosis Date  . Hypertension   . Arrhythmia   . Bell palsy   . Depression   . Hypercholesterolemia   . Pneumonia   . Diastolic heart failure   . Anxiety   . Thyroid disease   . Allergy    Past Surgical History  Procedure Laterality Date  . Cholecystectomy    . Knee surgery    . Hernia repair    . Vasectomy     Social History   Social History  . Marital Status: Married    Spouse Name: N/A  . Number of Children: 1  . Years of Education: N/A   Social History Main Topics  . Smoking status: Current Every Day Smoker -- 1.00 packs/day for 30 years    Types: Cigarettes  . Smokeless tobacco: None  . Alcohol Use: No  . Drug Use: No  . Sexual Activity: No   Other Topics Concern  . 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 Heart disease Mother   . Heart attack  Mother   . Other Mother     thyroid problems  . Cancer Mother     kidney cancer  . Cirrhosis Father   . Other Father     thyroid problems  . Alcohol abuse Father   . Hypertension Father   . Mental illness Brother   . Parkinsonism Brother    Allergies  Allergen Reactions  . Sulfa Drugs Cross Reactors Hives  . Codeine     Made him very jittery    Prior to Admission medications   Medication Sig Start Date End Date Taking? Authorizing Provider  albuterol (VENTOLIN HFA) 108 (90 BASE) MCG/ACT inhaler INHALE 2 PUFFS INTO THE LUNGS EVERY 6 HOURS AS NEEDED FOR WHEEZING/SHORTNESS OF BREATH 03/22/15  Yes Wardell Honour, MD  ALPRAZolam [name] 0.5 MG tablet TAKE 1 TABLET BY MOUTH 3 TIMES A DAY 09/27/15  Yes [name], MD  aspirin 81 MG tablet Take 81 mg by mouth daily.     Yes Historical Provider, MD  carvedilol (COREG) 12.5 MG tablet Take 1 tablet (12.5  mg total) by mouth 2 (two) times daily. 03/22/15  Yes Wardell Honour, MD  FLUoxetine (PROZAC) 40 MG capsule Take 2 capsules (80 mg total) by mouth daily. 09/27/15  Yes [name], MD  lisinopril (PRINIVIL,ZESTRIL) 20 MG tablet Take 1 tablet (20 mg total) by mouth daily. 09/27/15  Yes [name], MD  sildenafil (VIAGRA) 100 MG tablet Take 1 tablet (100 mg total) by mouth as needed for erectile dysfunction. 09/27/15  Yes [name], MD  simvastatin (ZOCOR) 40 MG tablet TAKE 1 TABLET BY MOUTH AT BEDTIME. 09/27/15  Yes [name], MD  SYNTHROID 150 MCG tablet TAKE 1 TABLET BY MOUTH EVERY MORNING ON AN EMPTY STOMACH 09/25/15  Yes [name], MD  zolpidem (AMBIEN) 10 MG tablet Take 1 tablet (10 mg total) by mouth at bedtime. 09/27/15  Yes [name], MD  chlorpheniramine-HYDROcodone [facility]) 10-8 MG/5ML SUER Take 5 mLs by mouth every 12 (twelve) hours as needed for cough. Patient not taking: Reported on 11/08/2015 09/27/15   [name], MD  ipratropium (ATROVENT) 0.03 % nasal spray Place 2  sprays into both nostrils 2 (two) times daily. Patient not taking: Reported on 11/08/2015 10/28/14   Tishira R Brewington, PA-C     ROS:  Constitutional: negative for fever, chills, night sweats, weight changes, or fatigue  HEENT: negative for vision changes, hearing loss, congestion, ST, epistaxis; positive for sinus pressure, congestion Cardiovascular: negative for chest pain or palpitations Respiratory: negative for hemoptysis, wheezing, shortness of breath; positive for cough Abdominal: negative for abdominal pain, nausea, vomiting, diarrhea, or constipation Dermatological: negative for rash Neurologic: negative for dizziness, or syncope; positive for headache All other systems reviewed and are otherwise negative with the exception to those above and in the HPI.  PHYSICAL EXAM: Filed Vitals:   11/08/15 1717  BP: 114/70  Pulse: 45  Temp: 99.8 F (37.7 C)  Resp: 16   Body mass index is 28.98 kg/(m^2).   General: Alert, no acute distress HEENT:  Normocephalic, atraumatic, oropharynx patent; Wax in both ears, significant nasal congestion Eye: [facility] Cardiovascular:  Regular rate and rhythm, no rubs murmurs or gallops.  No Carotid bruits, radial pulse intact. No pedal edema.  Respiratory: bilateral rhonchi but good air exchange, no ralesNo cyanosis, no use of accessory musculature Abdominal: No organomegaly, abdomen is soft and non-tender, positive bowel sounds. No masses. Musculoskeletal: Gait intact. No edema, tenderness Skin: No rashes. Neurologic: Facial musculature symmetric. Psychiatric: Patient acts appropriately throughout our interaction.  Lymphatic: No cervical or submandibular lymphadenopathy Genitourinary/Anorectal: No acute findings  LABS: Results for orders placed or performed in visit on 11/08/15  POCT Influenza A/B  Result Value Ref Range   Influenza A, POC Negative Negative   Influenza B, POC Negative Negative    EKG/XRAY:     ASSESSMENT/PLAN:   we'll treat with Augmentin twice a day along with Tussionex at night. Flu test were negative.I personally performed the services described in this documentation, which was scribed in my presence. The recorded information has been reviewed and is accurate. Patient agrees to come by next month and had a chest x-ray. He has not had a chest xray since 2012.  Gross sideeffects, risk and benefits, and alternatives of medications d/w patient. Patient is aware that all medications have potential sideeffects and we are unable to predict every sideeffect or drug-drug interaction that may occur.  [name] MD 11/08/2015 5:34 PM

## 2015-12-09 ENCOUNTER — Other Ambulatory Visit (INDEPENDENT_AMBULATORY_CARE_PROVIDER_SITE_OTHER): Payer: 59

## 2015-12-09 DIAGNOSIS — E890 Postprocedural hypothyroidism: Secondary | ICD-10-CM | POA: Diagnosis not present

## 2015-12-09 DIAGNOSIS — I10 Essential (primary) hypertension: Secondary | ICD-10-CM

## 2015-12-09 LAB — BASIC METABOLIC PANEL
BUN: 5 mg/dL — ABNORMAL LOW (ref 6–23)
CO2: 29 mEq/L (ref 19–32)
Calcium: 9.2 mg/dL (ref 8.4–10.5)
Chloride: 102 mEq/L (ref 96–112)
Creatinine, Ser: 0.86 mg/dL (ref 0.40–1.50)
GFR: 97.43 mL/min (ref >60.00)
Glucose, Bld: 100 mg/dL — ABNORMAL HIGH (ref 70–99)
Potassium: 4.2 mEq/L (ref 3.5–5.1)
Sodium: 138 mEq/L (ref 135–145)

## 2015-12-09 LAB — T4, FREE: Free T4: 1.25 ng/dL (ref 0.60–1.60)

## 2015-12-09 LAB — TSH: TSH: 0.29 u[IU]/mL — ABNORMAL LOW (ref 0.35–4.50)

## 2015-12-12 ENCOUNTER — Ambulatory Visit (INDEPENDENT_AMBULATORY_CARE_PROVIDER_SITE_OTHER): Payer: 59 | Admitting: Endocrinology

## 2015-12-12 ENCOUNTER — Encounter: Payer: Self-pay | Admitting: Endocrinology

## 2015-12-12 VITALS — BP 130/70 | HR 60 | Temp 97.9°F | Resp 14 | Ht 68.5 in | Wt 195.6 lb

## 2015-12-12 DIAGNOSIS — E890 Postprocedural hypothyroidism: Secondary | ICD-10-CM | POA: Diagnosis not present

## 2015-12-12 MED ORDER — SYNTHROID 137 MCG PO TABS: 137.0000 ug | ORAL_TABLET | Freq: Every day | ORAL | Status: DC

## 2015-12-12 NOTE — Patient Instructions (Signed)
Take 6 1/2 pills per week, need 137ug next Rx

## 2015-12-12 NOTE — Progress Notes (Signed)
Patient ID: Jesse Europe [name], male   DOB: 03-Feb-1961, 55 y.o.   MRN: [id number]    Reason for Appointment:  Hypothyroidism, followup visit   History of Present Illness:   The hypothyroidism was first diagnosed in 11/13. Previously had I-131 treatment for Graves' disease He was initially started with 88 mcg but his dose needed to be increased progressively  Complaints are reported by the patient now are none, no complaints of fatigue, unusual weight gain or cold sensitivity, and also no palpitations or shakiness        He has been treated with brand name SYNTHROID, now taking 150 mcg     Has not required a change in his dose for some time  He has lost weight since his last visit which he thinks is from just eating less              Compliance with the medical regimen has been as prescribed with taking the tablet in the morning before breakfast.  LABS:  TSH appears to be lower than usual  Lab Results  Component Value Date   TSH 0.29* 12/09/2015   TSH 1.87 12/10/2014   TSH 2.24 11/14/2013   FREET4 1.25 12/09/2015   FREET4 1.03 12/10/2014   FREET4 0.85 11/14/2013       Medication List       This list is accurate as of: 12/12/15  9:18 PM.  Always use your most recent med list.               albuterol 108 (90 Base) MCG/ACT inhaler  Commonly known as:  VENTOLIN HFA  INHALE 2 PUFFS INTO THE LUNGS EVERY 6 HOURS AS NEEDED FOR WHEEZING/SHORTNESS OF BREATH     ALPRAZolam 0.5 MG tablet  Commonly known as:  XANAX  TAKE 1 TABLET BY MOUTH 3 TIMES A DAY     aspirin 81 MG tablet  Take 81 mg by mouth daily.     carvedilol 12.5 MG tablet  Commonly known as:  COREG  Take 1 tablet (12.5 mg total) by mouth 2 (two) times daily.     FLUoxetine 40 MG capsule  Commonly known as:  PROZAC  Take 2 capsules (80 mg total) by mouth daily.     lisinopril 20 MG tablet  Commonly known as:  PRINIVIL,ZESTRIL  Take 1 tablet (20 mg total) by mouth daily.     sildenafil 100 MG tablet   Commonly known as:  VIAGRA  Take 1 tablet (100 mg total) by mouth as needed for erectile dysfunction.     simvastatin 40 MG tablet  Commonly known as:  ZOCOR  TAKE 1 TABLET BY MOUTH AT BEDTIME.     SYNTHROID 137 MCG tablet  Generic drug:  levothyroxine  Take 1 tablet (137 mcg total) by mouth daily before breakfast.     zolpidem 10 MG tablet  Commonly known as:  AMBIEN  Take 1 tablet (10 mg total) by mouth at bedtime.        Past Medical History  Diagnosis Date  . Hypertension   . Arrhythmia   . Bell palsy   . Depression   . Hypercholesterolemia   . Pneumonia   . Diastolic heart failure   . Anxiety   . Thyroid disease   . Allergy     Past Surgical History  Procedure Laterality Date  . Cholecystectomy    . Knee surgery    . Hernia repair    . Vasectomy  Family History  Problem Relation Age of Onset  . Heart disease Mother   . Heart attack Mother   . Other Mother     thyroid problems  . Cancer Mother     kidney cancer  . Cirrhosis Father   . Other Father     thyroid problems  . Alcohol abuse Father   . Hypertension Father   . Mental illness Brother   . Parkinsonism Brother     Social History:  reports that he has been smoking Cigarettes.  He has a 30 pack-year smoking history. He does not have any smokeless tobacco history on file. He reports that he does not drink alcohol or use illicit drugs.  Allergies:  Allergies  Allergen Reactions  . Sulfa Drugs Cross Reactors Hives  . Codeine     Made him very jittery     REVIEW of systems:  He has lost 13 pounds in the last year  Wt Readings from Last 3 Encounters:  12/12/15 195 lb 9.6 oz (88.724 kg)  11/08/15 193 lb 6.4 oz (87.726 kg)  09/27/15 196 lb 3.2 oz (88.996 kg)    HYPERTENSION: He has had long-standing hypertension which is well-controlled, has been managed by PCP with lisinopril and Coreg.     He has had a history of hypercholesterolemia followed by PCP    Examination:   BP  130/70 mmHg  Pulse 60  Temp(Src) 97.9 F (36.6 C)  Resp 14  Ht 5' 8.5" (1.74 m)  Wt 195 lb 9.6 oz (88.724 kg)  BMI 29.31 kg/m2  SpO2 98%   He looks well. Biceps reflexes are normal Skin appears normal    Assessments   Hypothyroidism, post I-131 ablation Previously had been consistently well controlled with 150 g of brand name Synthroid With losing 13 pounds he appears to be requiring smaller dose and TSH is slightly below normal  He was changed to 137 g and continue brand name Synthroid which is not costing him excessively He related follow-up in 3 months to ensure TSH is back to normal     , 12/12/2015, 9:18 PM

## 2016-02-20 ENCOUNTER — Emergency Department (HOSPITAL_BASED_OUTPATIENT_CLINIC_OR_DEPARTMENT_OTHER)
Admission: EM | Admit: 2016-02-20 | Discharge: 2016-02-20 | Disposition: A | Discharge status: Home / Self Care | Payer: 59 | Attending: Emergency Medicine | Admitting: Emergency Medicine

## 2016-02-20 ENCOUNTER — Emergency Department (HOSPITAL_BASED_OUTPATIENT_CLINIC_OR_DEPARTMENT_OTHER): Payer: 59

## 2016-02-20 ENCOUNTER — Encounter (HOSPITAL_BASED_OUTPATIENT_CLINIC_OR_DEPARTMENT_OTHER): Payer: Self-pay

## 2016-02-20 DIAGNOSIS — I110 Hypertensive heart disease with heart failure: Secondary | ICD-10-CM | POA: Insufficient documentation

## 2016-02-20 DIAGNOSIS — I5030 Unspecified diastolic (congestive) heart failure: Secondary | ICD-10-CM | POA: Insufficient documentation

## 2016-02-20 DIAGNOSIS — F17210 Nicotine dependence, cigarettes, uncomplicated: Secondary | ICD-10-CM | POA: Insufficient documentation

## 2016-02-20 DIAGNOSIS — F329 Major depressive disorder, single episode, unspecified: Secondary | ICD-10-CM | POA: Diagnosis not present

## 2016-02-20 DIAGNOSIS — Z7982 Long term (current) use of aspirin: Secondary | ICD-10-CM | POA: Insufficient documentation

## 2016-02-20 DIAGNOSIS — I4891 Unspecified atrial fibrillation: Secondary | ICD-10-CM | POA: Insufficient documentation

## 2016-02-20 DIAGNOSIS — R1030 Lower abdominal pain, unspecified: Secondary | ICD-10-CM | POA: Diagnosis present

## 2016-02-20 DIAGNOSIS — Z79899 Other long term (current) drug therapy: Secondary | ICD-10-CM | POA: Insufficient documentation

## 2016-02-20 DIAGNOSIS — K5732 Diverticulitis of large intestine without perforation or abscess without bleeding: Secondary | ICD-10-CM | POA: Insufficient documentation

## 2016-02-20 HISTORY — DX: Unspecified atrial fibrillation: I48.91

## 2016-02-20 LAB — CBC WITH DIFFERENTIAL/PLATELET
Basophils Absolute: 0 10*3/uL (ref 0.0–0.1)
Basophils Relative: 0 %
Eosinophils Absolute: 0.1 10*3/uL (ref 0.0–0.7)
Eosinophils Relative: 1 %
HCT: 46 % (ref 39.0–52.0)
Hemoglobin: 15.8 g/dL (ref 13.0–17.0)
Lymphocytes Relative: 10 %
Lymphs Abs: 2.2 10*3/uL (ref 0.7–4.0)
MCH: 30.4 pg (ref 26.0–34.0)
MCHC: 34.3 g/dL (ref 30.0–36.0)
MCV: 88.5 fL (ref 78.0–100.0)
Monocytes Absolute: 2.5 10*3/uL — ABNORMAL HIGH (ref 0.1–1.0)
Monocytes Relative: 11 %
Neutro Abs: 17.6 10*3/uL — ABNORMAL HIGH (ref 1.7–7.7)
Neutrophils Relative %: 78 %
Platelets: 199 10*3/uL (ref 150–400)
RBC: 5.2 MIL/uL (ref 4.22–5.81)
RDW: 14 % (ref 11.5–15.5)
WBC: 22.5 10*3/uL — ABNORMAL HIGH (ref 4.0–10.5)

## 2016-02-20 LAB — URINALYSIS, ROUTINE W REFLEX MICROSCOPIC
Bilirubin Urine: NEGATIVE
Glucose, UA: NEGATIVE mg/dL
Ketones, ur: NEGATIVE mg/dL
Leukocytes, UA: NEGATIVE
Nitrite: NEGATIVE
Protein, ur: NEGATIVE mg/dL
Specific Gravity, Urine: 1.007 (ref 1.005–1.030)
pH: 7 (ref 5.0–8.0)

## 2016-02-20 LAB — BASIC METABOLIC PANEL
Anion gap: 5 (ref 5–15)
BUN: 7 mg/dL (ref 6–20)
CO2: 28 mmol/L (ref 22–32)
Calcium: 8.4 mg/dL — ABNORMAL LOW (ref 8.9–10.3)
Chloride: 100 mmol/L — ABNORMAL LOW (ref 101–111)
Creatinine, Ser: 0.75 mg/dL (ref 0.61–1.24)
GFR calc Af Amer: >60 mL/min (ref >60)
GFR calc non Af Amer: >60 mL/min (ref >60)
Glucose, Bld: 103 mg/dL — ABNORMAL HIGH (ref 65–99)
Potassium: 3.6 mmol/L (ref 3.5–5.1)
Sodium: 133 mmol/L — ABNORMAL LOW (ref 135–145)

## 2016-02-20 LAB — URINE MICROSCOPIC-ADD ON: Bacteria, UA: NONE SEEN

## 2016-02-20 MED ORDER — DOXYCYCLINE HYCLATE 100 MG PO TABS
100.0000 mg | ORAL_TABLET | Freq: Once | ORAL | Status: AC
  Administered 2016-02-20: 100 mg via ORAL
  Filled 2016-02-20: qty 1

## 2016-02-20 MED ORDER — CIPROFLOXACIN HCL 500 MG PO TABS: ORAL_TABLET | ORAL | Status: DC

## 2016-02-20 MED ORDER — METRONIDAZOLE 500 MG PO TABS: 500.0000 mg | ORAL_TABLET | Freq: Three times a day (TID) | ORAL | Status: DC

## 2016-02-20 MED ORDER — HYDROCODONE-ACETAMINOPHEN 5-325 MG PO TABS: 1.0000 | ORAL_TABLET | Freq: Four times a day (QID) | ORAL | Status: DC | PRN

## 2016-02-20 MED ORDER — SODIUM CHLORIDE 0.9 % IV SOLN
INTRAVENOUS | Status: DC
  Administered 2016-02-20: 06:00:00 via INTRAVENOUS

## 2016-02-20 MED ORDER — IOPAMIDOL (ISOVUE-370) INJECTION 76%: 100.0000 mL | Freq: Once | INTRAVENOUS | Status: DC | PRN

## 2016-02-20 MED ORDER — FENTANYL CITRATE (PF) 100 MCG/2ML IJ SOLN
50.0000 ug | Freq: Once | INTRAMUSCULAR | Status: AC
  Administered 2016-02-20: 50 ug via INTRAVENOUS
  Filled 2016-02-20: qty 2

## 2016-02-20 MED ORDER — METRONIDAZOLE IN NACL 5-0.79 MG/ML-% IV SOLN
500.0000 mg | Freq: Once | INTRAVENOUS | Status: AC
  Administered 2016-02-20: 500 mg via INTRAVENOUS
  Filled 2016-02-20: qty 100

## 2016-02-20 MED ORDER — IOPAMIDOL (ISOVUE-300) INJECTION 61%
100.0000 mL | Freq: Once | INTRAVENOUS | Status: AC | PRN
  Administered 2016-02-20: 100 mL via INTRAVENOUS

## 2016-02-20 MED FILL — CIPROFLOXACIN HCL 500 MG TA: 500 | 10 days supply | Qty: 20 | Fill #0

## 2016-02-20 MED FILL — metroNIDAZOLE 500 MG TABS: 500 | 10 days supply | Qty: 30 | Fill #0

## 2016-02-20 MED FILL — HYDROCODON-APAP 5-325: 5-325 | 2 days supply | Qty: 10 | Fill #0

## 2016-02-20 NOTE — ED Notes (Signed)
MD at bedside. 

## 2016-02-20 NOTE — ED Notes (Signed)
Pt c/o lower abdominal pain and burning with urination since yesterday.  He also states he is constipated, although he had a small bowel movement yesterday.  He also found a tick on himself tonight.

## 2016-02-20 NOTE — ED Notes (Signed)
Patient transported to X-ray 

## 2016-02-20 NOTE — ED Notes (Signed)
Patient transported to CT 

## 2016-02-20 NOTE — ED Notes (Signed)
Pt returned from xray

## 2016-02-20 NOTE — ED Provider Notes (Signed)
CSN: [id number]     Arrival date & time 02/20/16  0440 History   First MD Initiated Contact with Patient 02/20/16 [phone]     Chief Complaint  Patient presents with  . Abdominal Pain     (Consider location/radiation/quality/duration/timing/severity/associated sxs/prior Treatment) HPI  This is a 55 year old male with a 2 day history of lower abdominal pain that he describes as crampy. It is worse when he attempts to move his bowels or urinate. When he urinates it radiates to the end of his penis. He has been constipated and has little stool over the past 4 days. He denies fever, chills, nausea and vomiting. Pain is moderate to severe at times.  He also notes he had a tick on his right lower abdomen which he removed earlier today.  Past Medical History  Diagnosis Date  . Hypertension   . Bell palsy   . Depression   . Hypercholesterolemia   . Pneumonia   . Diastolic heart failure   . Anxiety   . Thyroid disease   . Allergy   . Atrial fibrillation [facility])    Past Surgical History  Procedure Laterality Date  . Cholecystectomy    . Knee surgery    . Hernia repair    . Vasectomy     Family History  Problem Relation Age of Onset  . Heart disease Mother   . Heart attack Mother   . Other Mother     thyroid problems  . Cancer Mother     kidney cancer  . Cirrhosis Father   . Other Father     thyroid problems  . Alcohol abuse Father   . Hypertension Father   . Mental illness Brother   . Parkinsonism Brother    Social History  Substance Use Topics  . Smoking status: Current Every Day Smoker -- 1.00 packs/day for 30 years    Types: Cigarettes  . Smokeless tobacco: None  . Alcohol Use: No    Review of Systems  All other systems reviewed and are negative.   Allergies  Sulfa drugs cross reactors and Codeine  Home Medications   Prior to Admission medications   Medication Sig Start Date End Date Taking? Authorizing Provider  albuterol (VENTOLIN HFA) 108 (90 BASE) MCG/ACT  inhaler INHALE 2 PUFFS INTO THE LUNGS EVERY 6 HOURS AS NEEDED FOR WHEEZING/SHORTNESS OF BREATH 03/22/15   Wardell Honour, MD  ALPRAZolam [name] 0.5 MG tablet TAKE 1 TABLET BY MOUTH 3 TIMES A DAY 09/27/15   [name], MD  aspirin 81 MG tablet Take 81 mg by mouth daily.      Historical Provider, MD  carvedilol (COREG) 12.5 MG tablet Take 1 tablet (12.5 mg total) by mouth 2 (two) times daily. 03/22/15   Wardell Honour, MD  FLUoxetine (PROZAC) 40 MG capsule Take 2 capsules (80 mg total) by mouth daily. 09/27/15   [name], MD  lisinopril (PRINIVIL,ZESTRIL) 20 MG tablet Take 1 tablet (20 mg total) by mouth daily. 09/27/15   [name], MD  sildenafil (VIAGRA) 100 MG tablet Take 1 tablet (100 mg total) by mouth as needed for erectile dysfunction. 09/27/15   [name], MD  simvastatin (ZOCOR) 40 MG tablet TAKE 1 TABLET BY MOUTH AT BEDTIME. 09/27/15   [name], MD  SYNTHROID 137 MCG tablet Take 1 tablet (137 mcg total) by mouth daily before breakfast. 12/12/15   [name], MD  zolpidem (AMBIEN) 10 MG tablet Take 1 tablet (10 mg  total) by mouth at bedtime. 09/27/15   [name], MD   BP 122/85 mmHg  Pulse 66  Temp(Src) 98.3 F (36.8 C) (Oral)  Resp 16  Ht 5\' 9"  (1.753 m)  Wt 200 lb (90.719 kg)  BMI 29.52 kg/m2  SpO2 95%   Physical Exam  General: Well-developed, well-nourished male in no acute distress; appearance consistent with age of record HENT: normocephalic; atraumatic Eyes: pupils equal, round and reactive to light; extraocular muscles intact Neck: supple Heart: Irregular rhythm Lungs: clear to auscultation bilaterally Abdomen: soft; nondistended; suprapubic tenderness; no masses or hepatosplenomegaly; bowel sounds present Rectal: Normal sphincter tone; no formed stool in vault; prostate nontender Extremities: No deformity; full range of motion; pulses normal Neurologic: Awake, alert and oriented; motor function intact in all extremities and  symmetric; no facial droop Skin: Warm and dry Psychiatric: Flat affect    ED Course  Procedures (including critical care time)   MDM  Nursing notes and vitals signs, including pulse oximetry, reviewed.  Summary of this visit's results, reviewed by myself:  Labs:  Results for orders placed or performed during the hospital encounter of 02/20/16 (from the past 24 hour(s))  Urinalysis, Routine w reflex microscopic (not at [facility])     Status: Abnormal   Collection Time: 02/20/16  4:50 AM  Result Value Ref Range   Color, Urine YELLOW YELLOW   APPearance CLEAR CLEAR   Specific Gravity, Urine 1.007 1.005 - 1.030   pH 7.0 5.0 - 8.0   Glucose, UA NEGATIVE NEGATIVE mg/dL   Hgb urine dipstick TRACE (A) NEGATIVE   Bilirubin Urine NEGATIVE NEGATIVE   Ketones, ur NEGATIVE NEGATIVE mg/dL   Protein, ur NEGATIVE NEGATIVE mg/dL   Nitrite NEGATIVE NEGATIVE   Leukocytes, UA NEGATIVE NEGATIVE  Urine microscopic-add on     Status: Abnormal   Collection Time: 02/20/16  4:50 AM  Result Value Ref Range   Squamous Epithelial / LPF 0-5 (A) NONE SEEN   WBC, UA 0-5 0 - 5 WBC/hpf   RBC / HPF 0-5 0 - 5 RBC/hpf   Bacteria, UA NONE SEEN NONE SEEN  Basic metabolic panel     Status: Abnormal   Collection Time: 02/20/16  5:45 AM  Result Value Ref Range   Sodium 133 (L) 135 - 145 mmol/L   Potassium 3.6 3.5 - 5.1 mmol/L   Chloride 100 (L) 101 - 111 mmol/L   CO2 28 22 - 32 mmol/L   Glucose, Bld 103 (H) 65 - 99 mg/dL   BUN 7 6 - 20 mg/dL   Creatinine, Ser 0.75 0.61 - 1.24 mg/dL   Calcium 8.4 (L) 8.9 - 10.3 mg/dL   GFR calc non Af Amer >60 >60 mL/min   GFR calc Af Amer >60 >60 mL/min   Anion gap 5 5 - 15  CBC with Differential/Platelet     Status: Abnormal   Collection Time: 02/20/16  5:45 AM  Result Value Ref Range   WBC 22.5 (H) 4.0 - 10.5 K/uL   RBC 5.20 4.22 - 5.81 MIL/uL   Hemoglobin 15.8 13.0 - 17.0 g/dL   HCT 46.0 39.0 - 52.0 %   MCV 88.5 78.0 - 100.0 fL   MCH 30.4 26.0 - 34.0 pg   MCHC 34.3  30.0 - 36.0 g/dL   RDW 14.0 11.5 - 15.5 %   Platelets 199 150 - 400 K/uL   Neutrophils Relative % 78 %   Neutro Abs 17.6 (H) 1.7 - 7.7 K/uL   Lymphocytes Relative  10 %   Lymphs Abs 2.2 0.7 - 4.0 K/uL   Monocytes Relative 11 %   Monocytes Absolute 2.5 (H) 0.1 - 1.0 K/uL   Eosinophils Relative 1 %   Eosinophils Absolute 0.1 0.0 - 0.7 K/uL   Basophils Relative 0 %   Basophils Absolute 0.0 0.0 - 0.1 K/uL    Imaging Studies: Ct Abdomen Pelvis W Contrast  02/20/2016  CLINICAL DATA:  Initial evaluation for acute pelvic pain, constipation. EXAM: CT ABDOMEN AND PELVIS WITH CONTRAST TECHNIQUE: Multidetector CT imaging of the abdomen and pelvis was performed using the standard protocol following bolus administration of intravenous contrast. CONTRAST:  11mL ISOVUE-300 IOPAMIDOL (ISOVUE-300) INJECTION 61% COMPARISON:  Prior radiograph from 02/20/2016. FINDINGS: Elevation of the right hemidiaphragm with associated right basilar atelectasis. Visualized lung bases are otherwise clear. Scattered coronary artery calcifications noted. Mild diffuse hypoattenuation of the liver likely related to timing of the contrast bolus. Liver otherwise unremarkable. Gallbladder surgically absent. No biliary dilatation. Spleen, adrenal glands, and pancreas demonstrate a normal contrast enhanced appearance. Kidneys are equal in size with symmetric enhancement. Few scattered cysts noted. No nephrolithiasis, hydronephrosis, or focal enhancing renal mass. Stomach within normal limits. No evidence for bowel obstruction. No acute inflammatory changes about the bowels. Normal appendix. Extensive inflammatory stranding about multiple diverticula within the sigmoid colon, consistent with acute diverticulitis. No evidence for perforation. No definite discrete abscess. Associated small volume free fluid within the pelvis. Mild hazy stranding along the posterior aspect of the bladder related to the inflammatory process in the sigmoid. Bladder  are otherwise unremarkable. Prostate mildly enlarged with median lobe hypertrophy. No pathologically enlarged lymph nodes within the abdomen and pelvis. Scattered atheromatous plaque within the intra-abdominal aorta and its branch vessels. No aneurysm. No acute osseous abnormality. No worrisome lytic or blastic osseous lesions. IMPRESSION: 1. Findings consistent with acute sigmoid diverticulitis. No evidence for perforation or other complication. 2. No other acute intra-abdominal or pelvic process. 3. Status post cholecystectomy. 4. Elevation of the right hemidiaphragm with associated right basilar atelectasis. Electronically Signed   By: Jeannine Boga M.D.   On: 02/20/2016 07:10   Dg Abd Acute W/chest  02/20/2016  CLINICAL DATA:  Constipation. EXAM: DG ABDOMEN ACUTE W/ 1V CHEST COMPARISON:  Chest x-ray 12/14/2010. FINDINGS: No abnormal stool retention to correlate with constipation history. Nonobstructive bowel gas pattern. Chronic elevation of the right diaphragm. Right-sided calcification is within the right liver tip based on 06/15/2011 chest CT. Cholecystectomy clips. Negative for pneumoperitoneum. There is no edema, consolidation, effusion, or pneumothorax. Normal heart size and aortic contours. IMPRESSION: 1. No stool retention to correlate with constipation history. Normal bowel gas pattern. 2. No evidence of acute cardiopulmonary disease. Electronically Signed   By: Monte Fantasia M.D.   On: 02/20/2016 05:39       Shanon Rosser, MD 02/20/16 [phone]

## 2016-03-10 ENCOUNTER — Other Ambulatory Visit: Payer: 59

## 2016-03-13 ENCOUNTER — Ambulatory Visit: Payer: 59 | Admitting: Endocrinology

## 2016-03-16 ENCOUNTER — Other Ambulatory Visit (INDEPENDENT_AMBULATORY_CARE_PROVIDER_SITE_OTHER): Payer: 59

## 2016-03-16 DIAGNOSIS — E890 Postprocedural hypothyroidism: Secondary | ICD-10-CM | POA: Diagnosis not present

## 2016-03-16 LAB — TSH: TSH: 0.19 u[IU]/mL — ABNORMAL LOW (ref 0.35–4.50)

## 2016-03-16 LAB — T4, FREE: Free T4: 1.21 ng/dL (ref 0.60–1.60)

## 2016-03-19 ENCOUNTER — Ambulatory Visit (INDEPENDENT_AMBULATORY_CARE_PROVIDER_SITE_OTHER): Payer: 59 | Admitting: Endocrinology

## 2016-03-19 VITALS — BP 112/80 | HR 56 | Ht 68.5 in | Wt 192.0 lb

## 2016-03-19 DIAGNOSIS — E890 Postprocedural hypothyroidism: Secondary | ICD-10-CM | POA: Diagnosis not present

## 2016-03-19 MED ORDER — SYNTHROID 112 MCG PO TABS: 112.0000 ug | ORAL_TABLET | Freq: Every day | ORAL | Status: DC

## 2016-03-19 NOTE — Progress Notes (Signed)
Patient ID: Jesse Europe [name], male   DOB: 05-Jun-1961, 55 y.o.   MRN: [id number]    Reason for Appointment:  Hypothyroidism, followup visit   History of Present Illness:   The hypothyroidism was first diagnosed in 11/13. Previously had I-131 treatment for Graves' disease He was initially started with 88 mcg but his dose needed to be increased progressively  Complaints are reported by the patient now are none, no complaints of fatigue Still has no complaints of shakiness, sweating or palpitations  He has been treated with brand name SYNTHROID, now taking 137 mcg     Has not required a change in his dose for some time until 3/17 when his TSH was slightly low  He has lost weight since his last visit which he thinks is from just eating less              Compliance with the medical regimen has been as prescribed with taking the tablet in the morning before breakfast.  Wt Readings from Last 3 Encounters:  03/19/16 192 lb (87.091 kg)  02/20/16 200 lb (90.719 kg)  12/12/15 195 lb 9.6 oz (88.724 kg)    LABS:  TSH appears to be lower than Expected even after his dose reduction in March   Lab Results  Component Value Date   TSH 0.19* 03/16/2016   TSH 0.29* 12/09/2015   TSH 1.87 12/10/2014   FREET4 1.21 03/16/2016   FREET4 1.25 12/09/2015   FREET4 1.03 12/10/2014       Medication List       This list is accurate as of: 03/19/16 10:34 AM.  Always use your most recent med list.               albuterol 108 (90 Base) MCG/ACT inhaler  Commonly known as:  VENTOLIN HFA  INHALE 2 PUFFS INTO THE LUNGS EVERY 6 HOURS AS NEEDED FOR WHEEZING/SHORTNESS OF BREATH     ALPRAZolam 0.5 MG tablet  Commonly known as:  XANAX  TAKE 1 TABLET BY MOUTH 3 TIMES A DAY     aspirin 81 MG tablet  Take 81 mg by mouth daily.     carvedilol 12.5 MG tablet  Commonly known as:  COREG  Take 1 tablet (12.5 mg total) by mouth 2 (two) times daily.     FLUoxetine 40 MG capsule  Commonly  known as:  PROZAC  Take 2 capsules (80 mg total) by mouth daily.     HYDROcodone-acetaminophen 5-325 MG tablet  Commonly known as:  NORCO/VICODIN  Take 1-2 tablets by mouth every 6 (six) hours as needed (for pain; may cause constipation).     lisinopril 20 MG tablet  Commonly known as:  PRINIVIL,ZESTRIL  Take 1 tablet (20 mg total) by mouth daily.     sildenafil 100 MG tablet  Commonly known as:  VIAGRA  Take 1 tablet (100 mg total) by mouth as needed for erectile dysfunction.     simvastatin 40 MG tablet  Commonly known as:  ZOCOR  TAKE 1 TABLET BY MOUTH AT BEDTIME.     SYNTHROID 112 MCG tablet  Generic drug:  levothyroxine  Take 1 tablet (112 mcg total) by mouth daily before breakfast.     zolpidem 10 MG tablet  Commonly known as:  AMBIEN  Take 1 tablet (10 mg total) by mouth at bedtime.        Past Medical History  Diagnosis Date  . Hypertension   . Bell palsy   .  Depression   . Hypercholesterolemia   . Pneumonia   . Diastolic heart failure   . Anxiety   . Thyroid disease   . Allergy   . Atrial fibrillation [facility])     Past Surgical History  Procedure Laterality Date  . Cholecystectomy    . Knee surgery    . Hernia repair    . Vasectomy      Family History  Problem Relation Age of Onset  . Heart disease Mother   . Heart attack Mother   . Other Mother     thyroid problems  . Cancer Mother     kidney cancer  . Cirrhosis Father   . Other Father     thyroid problems  . Alcohol abuse Father   . Hypertension Father   . Mental illness Brother   . Parkinsonism Brother     Social History:  reports that he has been smoking Cigarettes.  He has a 30 pack-year smoking history. He does not have any smokeless tobacco history on file. He reports that he does not drink alcohol or use illicit drugs.  Allergies:  Allergies  Allergen Reactions  . Sulfa Drugs Cross Reactors Hives  . Codeine     Made him very jittery     REVIEW of systems:  He has lost  Weight in the last year, he thinks he loses more during summer also   Wt Readings from Last 3 Encounters:  03/19/16 192 lb (87.091 kg)  02/20/16 200 lb (90.719 kg)  12/12/15 195 lb 9.6 oz (88.724 kg)    HYPERTENSION: He has had long-standing hypertension which is well-controlled, has been managed by PCP with lisinopril and Coreg.    He has had a history of hypercholesterolemia followed by PCP    Examination:   BP 112/80 mmHg  Pulse 56  Ht 5' 8.5" (1.74 m)  Wt 192 lb (87.091 kg)  BMI 28.77 kg/m2  SpO2 98%   He looks well. Biceps reflexes are normal, no tremor Skin appears normal    Assessments   Hypothyroidism, post I-131 ablation Previously had been consistently well controlled with 150 g of brand name Synthroid With losing weight he appears to be again requiring lower doses However even with cutting back to 137 with a minimal reduction in TSH is TSH now is further decreased although no overt hyperthyroidism present  He was changed to 112 g and continue brand name Synthroid  He will have lab work in 6 weeks Otherwise follow-up in 6 months       , 03/19/2016, 10:34 AM

## 2016-03-24 ENCOUNTER — Other Ambulatory Visit: Payer: Self-pay | Admitting: Family Medicine

## 2016-03-25 ENCOUNTER — Other Ambulatory Visit: Payer: Self-pay | Admitting: Family Medicine

## 2016-04-07 ENCOUNTER — Other Ambulatory Visit: Payer: Self-pay | Admitting: Family Medicine

## 2016-04-07 DIAGNOSIS — I10 Essential (primary) hypertension: Secondary | ICD-10-CM

## 2016-04-21 ENCOUNTER — Other Ambulatory Visit: Payer: Self-pay

## 2016-04-21 DIAGNOSIS — E785 Hyperlipidemia, unspecified: Secondary | ICD-10-CM

## 2016-04-21 MED ORDER — SIMVASTATIN 40 MG PO TABS: ORAL_TABLET | ORAL | 0 refills | Status: DC

## 2016-04-28 ENCOUNTER — Telehealth: Payer: Self-pay | Admitting: Endocrinology

## 2016-04-28 NOTE — Telephone Encounter (Signed)
Jesse Suarez, See below, Thanks!

## 2016-04-28 NOTE — Telephone Encounter (Signed)
See note below and please advise, Thanks! 

## 2016-04-28 NOTE — Telephone Encounter (Signed)
PT called, he is scheduled for labs this Thursday but is not scheduled for a visit with Dr. Dwyane Dee.  He wants to know if he needs to come in for the labs or if this was scheduled in error?

## 2016-04-28 NOTE — Telephone Encounter (Signed)
This is a lab only visit

## 2016-04-30 ENCOUNTER — Encounter: Payer: Self-pay | Admitting: Endocrinology

## 2016-04-30 ENCOUNTER — Other Ambulatory Visit (INDEPENDENT_AMBULATORY_CARE_PROVIDER_SITE_OTHER): Payer: 59

## 2016-04-30 DIAGNOSIS — E890 Postprocedural hypothyroidism: Secondary | ICD-10-CM

## 2016-04-30 LAB — TSH: TSH: 3.55 u[IU]/mL (ref 0.35–4.50)

## 2016-04-30 LAB — T4, FREE: Free T4: 0.82 ng/dL (ref 0.60–1.60)

## 2016-04-30 NOTE — Progress Notes (Signed)
Please let patient know that the lab result is normal and no further action needed

## 2016-05-17 ENCOUNTER — Other Ambulatory Visit: Payer: Self-pay | Admitting: Emergency Medicine

## 2016-05-17 DIAGNOSIS — E785 Hyperlipidemia, unspecified: Secondary | ICD-10-CM

## 2016-05-22 ENCOUNTER — Ambulatory Visit (INDEPENDENT_AMBULATORY_CARE_PROVIDER_SITE_OTHER): Payer: 59 | Admitting: Emergency Medicine

## 2016-05-22 ENCOUNTER — Encounter: Payer: Self-pay | Admitting: Emergency Medicine

## 2016-05-22 VITALS — BP 122/74 | HR 59 | Temp 98.5°F | Resp 17 | Ht 69.5 in | Wt 196.0 lb

## 2016-05-22 DIAGNOSIS — E038 Other specified hypothyroidism: Secondary | ICD-10-CM | POA: Diagnosis not present

## 2016-05-22 DIAGNOSIS — I10 Essential (primary) hypertension: Secondary | ICD-10-CM | POA: Diagnosis not present

## 2016-05-22 DIAGNOSIS — Z23 Encounter for immunization: Secondary | ICD-10-CM | POA: Diagnosis not present

## 2016-05-22 DIAGNOSIS — N521 Erectile dysfunction due to diseases classified elsewhere: Secondary | ICD-10-CM

## 2016-05-22 DIAGNOSIS — E785 Hyperlipidemia, unspecified: Secondary | ICD-10-CM | POA: Diagnosis not present

## 2016-05-22 DIAGNOSIS — Z125 Encounter for screening for malignant neoplasm of prostate: Secondary | ICD-10-CM

## 2016-05-22 DIAGNOSIS — F17210 Nicotine dependence, cigarettes, uncomplicated: Secondary | ICD-10-CM

## 2016-05-22 DIAGNOSIS — F418 Other specified anxiety disorders: Secondary | ICD-10-CM | POA: Diagnosis not present

## 2016-05-22 DIAGNOSIS — Z1159 Encounter for screening for other viral diseases: Secondary | ICD-10-CM

## 2016-05-22 LAB — POCT CBC
Granulocyte percent: 73.6 %G (ref 37–80)
HCT, POC: 46.7 % (ref 43.5–53.7)
Hemoglobin: 17 g/dL (ref 14.1–18.1)
Lymph, poc: 2.8 (ref 0.6–3.4)
MCH, POC: 31.4 pg — AB (ref 27–31.2)
MCHC: 36.4 g/dL — AB (ref 31.8–35.4)
MCV: 86.4 fL (ref 80–97)
MID (cbc): 0.7 (ref 0–0.9)
MPV: 7.9 fL (ref 0–99.8)
POC Granulocyte: 9.8 — AB (ref 2–6.9)
POC LYMPH PERCENT: 21.1 %L (ref 10–50)
POC MID %: 5.3 %M (ref 0–12)
Platelet Count, POC: 219 10*3/uL (ref 142–424)
RBC: 5.41 M/uL (ref 4.69–6.13)
RDW, POC: 13.6 %
WBC: 13.3 10*3/uL — AB (ref 4.6–10.2)

## 2016-05-22 LAB — POCT URINALYSIS DIP (MANUAL ENTRY)
Bilirubin, UA: NEGATIVE
Blood, UA: NEGATIVE
Glucose, UA: NEGATIVE
Ketones, POC UA: NEGATIVE
Leukocytes, UA: NEGATIVE
Nitrite, UA: NEGATIVE
Protein Ur, POC: NEGATIVE
Spec Grav, UA: <=1.005
Urobilinogen, UA: 0.2
pH, UA: 6.5

## 2016-05-22 LAB — LIPID PANEL
Cholesterol: 171 mg/dL (ref 125–200)
HDL: 52 mg/dL (ref >=40)
LDL Cholesterol: 99 mg/dL (ref <130)
Total CHOL/HDL Ratio: 3.3 Ratio (ref <=5.0)
Triglycerides: 99 mg/dL (ref <150)
VLDL: 20 mg/dL (ref <30)

## 2016-05-22 LAB — COMPLETE METABOLIC PANEL WITH GFR
ALT: 23 U/L (ref 9–46)
AST: 21 U/L (ref 10–35)
Albumin: 4.7 g/dL (ref 3.6–5.1)
Alkaline Phosphatase: 79 U/L (ref 40–115)
BUN: 7 mg/dL (ref 7–25)
CO2: 27 mmol/L (ref 20–31)
Calcium: 9.2 mg/dL (ref 8.6–10.3)
Chloride: 96 mmol/L — ABNORMAL LOW (ref 98–110)
Creat: 0.84 mg/dL (ref 0.70–1.33)
GFR, Est African American: >89 mL/min (ref >=60)
GFR, Est Non African American: >89 mL/min (ref >=60)
Glucose, Bld: 74 mg/dL (ref 65–99)
Potassium: 4.6 mmol/L (ref 3.5–5.3)
Sodium: 132 mmol/L — ABNORMAL LOW (ref 135–146)
Total Bilirubin: 0.7 mg/dL (ref 0.2–1.2)
Total Protein: 7.7 g/dL (ref 6.1–8.1)

## 2016-05-22 LAB — POC MICROSCOPIC URINALYSIS (UMFC): Mucus: ABSENT

## 2016-05-22 MED ORDER — LISINOPRIL 20 MG PO TABS: 20.0000 mg | ORAL_TABLET | Freq: Every day | ORAL | 2 refills | Status: DC

## 2016-05-22 MED ORDER — SIMVASTATIN 40 MG PO TABS: ORAL_TABLET | ORAL | 11 refills | Status: DC

## 2016-05-22 MED ORDER — CARVEDILOL 12.5 MG PO TABS
12.5000 mg | ORAL_TABLET | Freq: Two times a day (BID) | ORAL | 11 refills | Status: DC
Start: 2016-05-22 — End: 2016-06-09

## 2016-05-22 MED ORDER — SYNTHROID 112 MCG PO TABS: 112.0000 ug | ORAL_TABLET | Freq: Every day | ORAL | 11 refills | Status: DC

## 2016-05-22 MED ORDER — ALBUTEROL SULFATE HFA 108 (90 BASE) MCG/ACT IN AERS: INHALATION_SPRAY | RESPIRATORY_TRACT | 5 refills | Status: DC

## 2016-05-22 MED ORDER — ALPRAZOLAM 0.5 MG PO TABS: ORAL_TABLET | ORAL | 5 refills | Status: DC

## 2016-05-22 MED ORDER — SILDENAFIL CITRATE 100 MG PO TABS: 100.0000 mg | ORAL_TABLET | ORAL | 5 refills | Status: DC | PRN

## 2016-05-22 MED ORDER — FLUOXETINE HCL 40 MG PO CAPS: 80.0000 mg | ORAL_CAPSULE | Freq: Every day | ORAL | 3 refills | Status: DC

## 2016-05-22 MED ORDER — ZOLPIDEM TARTRATE 5 MG PO TABS: 5.0000 mg | ORAL_TABLET | Freq: Every day | ORAL | 1 refills | Status: DC

## 2016-05-22 NOTE — Progress Notes (Signed)
By signing my name below, I, Mesha Guinyard, attest that this documentation has been prepared under the direction and in the presence of [name], MD.  Electronically Signed: Verlee Monte, Medical Scribe. 05/22/16. 12:25 PM.  Chief Complaint:  Chief Complaint  Patient presents with  . Medication Refill    all medicines on list   . Immunizations    flu, pneumonia     HPI: Jesse AMAN [name] is a 55 y.o. male who reports to [facility] today for immunizations and medication refill. Pt states he's trying to cut back on his medications.  Immunizations: Pt would like to get his PNA vaccine and his flu shot. Pt states he had his PNA-23 about 5 years ago when he went "into A Fib and his thyroid was messed up". Immunization History  Administered Date(s) Administered  . Influenza,inj,Quad PF,36+ Mos 06/13/2013  . Influenza-Unspecified 05/16/2015   Cancer Screening: Colon CA: Pt states he had his colon checked recently.  Past Medical History:  Diagnosis Date  . Allergy   . Anxiety   . Atrial fibrillation ([place])   . Bell palsy   . Depression   . Diastolic heart failure   . Hypercholesterolemia   . Hypertension   . Pneumonia   . Thyroid disease    Past Surgical History:  Procedure Laterality Date  . CHOLECYSTECTOMY    . HERNIA REPAIR    . KNEE SURGERY    . VASECTOMY     Social History   Social History  . Marital status: Married    Spouse name: N/A  . Number of children: 1  . Years of education: N/A   Social History Main Topics  . Smoking status: Current Every Day Smoker    Packs/day: 1.00    Years: 30.00    Types: Cigarettes  . Smokeless tobacco: None  . Alcohol use No  . Drug use: No  . Sexual activity: No   Other Topics Concern  . 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 Heart disease Mother   . Heart attack Mother   . Other Mother     thyroid problems  . Cancer Mother     kidney cancer  . Cirrhosis Father   . Other Father     thyroid problems  . Alcohol abuse Father   . Hypertension Father   . Mental illness Brother   . Parkinsonism Brother    Allergies  Allergen Reactions  . Sulfa Drugs Cross Reactors Hives  . Codeine     Made him very jittery    Prior to Admission medications   Medication Sig Start Date End Date Taking? Authorizing Provider  ALPRAZolam [name] 0.5 MG tablet TAKE 1 TABLET BY MOUTH 3 TIMES A DAY 09/27/15  Yes [name], MD  aspirin 81 MG tablet Take 81 mg by mouth daily.     Yes Historical Provider, MD  carvedilol (COREG) 12.5 MG tablet TAKE 1 TABLET (12.5 MG TOTAL) BY MOUTH  2 (TWO) TIMES DAILY. 04/09/16  Yes Darlyne Russian, MD  FLUoxetine (PROZAC) 40 MG capsule Take 2 capsules (80 mg total) by mouth daily. 09/27/15  Yes [name], MD  lisinopril (PRINIVIL,ZESTRIL) 20 MG tablet Take 1 tablet (20 mg total) by mouth daily. 09/27/15  Yes [name], MD  sildenafil (VIAGRA) 100 MG tablet Take 1 tablet (100 mg total) by mouth as needed for erectile dysfunction. 09/27/15  Yes [name], MD  simvastatin (ZOCOR) 40 MG tablet TAKE 1 TABLET BY MOUTH AT BEDTIME. 04/21/16  Yes Darlyne Russian, MD  SYNTHROID 112 MCG tablet Take 1 tablet (112 mcg total) by mouth daily before breakfast. 03/19/16  Yes [name], MD  VENTOLIN HFA 108 (90 Base) MCG/ACT inhaler INHALE 2 PUFFS INTO THE LUNGS EVERY 6 HOURS AS NEEDED FOR WHEEZING/SHORTNESS OF BREATH 03/26/16  Yes Darlyne Russian, MD  zolpidem (AMBIEN) 10 MG tablet Take 1 tablet (10 mg total) by mouth at bedtime. 09/27/15  Yes [name], MD  HYDROcodone-acetaminophen (NORCO/VICODIN) 5-325 MG tablet Take 1-2  tablets by mouth every 6 (six) hours as needed (for pain; may cause constipation). Patient not taking: Reported on 05/22/2016 02/20/16   Shanon Rosser, MD     ROS: The patient denies fevers, chills, night sweats, unintentional weight loss, chest pain, palpitations, wheezing, dyspnea on exertion, nausea, vomiting, abdominal pain, dysuria, hematuria, melena, numbness, weakness, or tingling.  All other systems have been reviewed and were otherwise negative with the exception of those mentioned in the HPI and as above.    PHYSICAL EXAM: Vitals:   05/22/16 1219  BP: 122/74  Pulse: (!) 59  Resp: 17  Temp: 98.5 F (36.9 C)   Body mass index is 28.53 kg/m.   General: Alert, no acute distress HEENT:  Normocephalic, atraumatic, oropharynx patent. Eye: Juliette Mangle [facility] Cardiovascular:  Regular rate and rhythm, no rubs murmurs or gallops.  No Carotid bruits, radial pulse intact. No pedal edema.  Respiratory: Clear to auscultation bilaterally.  No wheezes, rales, or rhonchi.  No cyanosis, no use of accessory musculature Abdominal: No organomegaly, abdomen is soft and non-tender, positive bowel sounds.  No masses. Musculoskeletal: Gait intact. No edema, tenderness Skin: No rashes. Neurologic: Facial musculature symmetric. Psychiatric: Patient acts appropriately throughout our interaction. Lymphatic: No cervical or submandibular lymphadenopathy   LABS: Results for orders placed or performed in visit on 05/22/16  POCT CBC  Result Value Ref Range   WBC 13.3 (A) 4.6 - 10.2 K/uL   Lymph, poc 2.8 0.6 - 3.4   POC LYMPH PERCENT 21.1 10 - 50 %L   MID (cbc) 0.7 0 - 0.9   POC MID % 5.3 0 - 12 %M   POC Granulocyte 9.8 (A) 2 - 6.9   Granulocyte percent 73.6 37 - 80 %G   RBC 5.41 4.69 - 6.13 M/uL   Hemoglobin 17.0 14.1 - 18.1 g/dL   HCT, POC 46.7 43.5 - 53.7 %   MCV 86.4 80 - 97 fL   MCH, POC 31.4 (A) 27 - 31.2 pg   MCHC 36.4 (A) 31.8 - 35.4 g/dL   RDW, POC 13.6 %   Platelet Count, POC 219 142 - 424  K/uL   MPV 7.9 0 - 99.8 fL  POCT urinalysis dipstick  Result Value Ref Range   Color, UA yellow yellow   Clarity, UA clear clear   Glucose, UA negative negative   Bilirubin, UA negative negative   Ketones, POC UA negative negative  Spec Grav, UA <=1.005    Blood, UA negative negative   pH, UA 6.5    Protein Ur, POC negative negative   Urobilinogen, UA 0.2    Nitrite, UA Negative Negative   Leukocytes, UA Negative Negative  POCT Microscopic Urinalysis ([facility])  Result Value Ref Range   WBC,UR,HPF,POC None None WBC/hpf   RBC,UR,HPF,POC None None RBC/hpf   Bacteria None None, Too numerous to count   Mucus Absent Absent   Epithelial Cells, UR Per Microscopy None None, Too numerous to count cells/hpf   EKG/XRAY:   Primary read interpreted by Dr. Everlene Farrier at [facility].  ASSESSMENT/PLAN: His biggest problem is continued smoking. He was given his flu shot. Routine labs were done including a PSA. He is up-to-date on colonoscopy he was again encouraged he must stop smoking.I personally performed the services described in this documentation, which was scribed in my presence. The recorded information has been reviewed and is accurate.   Gross sideeffects, risk and benefits, and alternatives of medications d/w patient. Patient is aware that all medications have potential sideeffects and we are unable to predict every sideeffect or drug-drug interaction that may occur.  [name] MD 05/22/2016 12:24 PM

## 2016-05-22 NOTE — Patient Instructions (Signed)
     IF you received an x-ray today, you will receive an invoice from [facility]. Please contact [facility] at [phone] with questions or concerns regarding your invoice.   IF you received labwork today, you will receive an invoice from [facility]. Please contact [facility] at [phone] with questions or concerns regarding your invoice.   Our billing staff will not be able to assist you with questions regarding bills from these companies.  You will be contacted with the lab results as soon as they are available. The fastest way to get your results is to activate your My Chart account. Instructions are located on the last page of this paperwork. If you have not heard from us regarding the results in 2 weeks, please contact this office.      

## 2016-05-23 LAB — PSA: PSA: 2.5 ng/mL (ref <=4.0)

## 2016-05-23 LAB — HEPATITIS C ANTIBODY: HCV Ab: NEGATIVE

## 2016-05-26 ENCOUNTER — Telehealth: Payer: Self-pay

## 2016-05-26 NOTE — Telephone Encounter (Signed)
PATIENT STATES SOMEONE CALLED AND LEFT A MESSAGE SAYING HIS SODIUM WAS LOW. HE WOULD LIKE TO KNOW HOW LOW IT WAS AND WHAT HE NEEDS TO DO TO GET IT BACK UP. BEST PHONE [phone] (CELL)  [place], [place]    [place]

## 2016-05-29 NOTE — Telephone Encounter (Signed)
Spoke with patient regarding sodium levels. Instructed pt to have labs rechecked in three months

## 2016-06-09 ENCOUNTER — Other Ambulatory Visit: Payer: Self-pay

## 2016-06-09 DIAGNOSIS — I10 Essential (primary) hypertension: Secondary | ICD-10-CM

## 2016-06-09 DIAGNOSIS — E785 Hyperlipidemia, unspecified: Secondary | ICD-10-CM

## 2016-06-09 MED ORDER — CARVEDILOL 12.5 MG PO TABS: 12.5000 mg | ORAL_TABLET | Freq: Two times a day (BID) | ORAL | 3 refills | Status: DC

## 2016-06-09 MED ORDER — SIMVASTATIN 40 MG PO TABS: ORAL_TABLET | ORAL | 3 refills | Status: DC

## 2016-06-11 ENCOUNTER — Other Ambulatory Visit: Payer: Self-pay | Admitting: Endocrinology

## 2016-08-20 ENCOUNTER — Encounter: Payer: Self-pay | Admitting: *Deleted

## 2016-08-25 ENCOUNTER — Ambulatory Visit (INDEPENDENT_AMBULATORY_CARE_PROVIDER_SITE_OTHER): Payer: 59 | Admitting: Internal Medicine

## 2016-08-25 ENCOUNTER — Encounter: Payer: Self-pay | Admitting: Internal Medicine

## 2016-08-25 VITALS — BP 148/100 | HR 64 | Ht 69.5 in | Wt 210.2 lb

## 2016-08-25 DIAGNOSIS — I4891 Unspecified atrial fibrillation: Secondary | ICD-10-CM

## 2016-08-25 NOTE — Patient Instructions (Signed)
Medication Instructions:  Your physician recommends that you continue on your current medications as directed. Please refer to the Current Medication list given to you today.   Labwork: None Ordered   Testing/Procedures: None Ordered   Follow-Up: Follow-up with Dr. Taylor as needed    Any Other Special Instructions Will Be Listed Below (If Applicable).     If you need a refill on your cardiac medications before your next appointment, please call your pharmacy.   

## 2016-08-25 NOTE — Progress Notes (Signed)
HPI Jesse Suarez returns today for followup. He is a pleasant 55 yo man with a h/o HTN, sinus node dysfunction, COPD and atrial fibrillation in the setting of thyroid storm which occurred almost 6 years ago. He has done well in the interim with no symptomatic atrial fib. During his thyrotoxic period, he was difficult to control but since then he has done well with NSR. He is now off of systemic anti-coagulation and has had no atrial fibrillation. He is taking synthroid after having his thyroid radiated. He feels well, although he is a little tired of working full time. Allergies  Allergen Reactions  . Sulfa Drugs Cross Reactors Hives  . Codeine     Made him very jittery      Current Outpatient Prescriptions  Medication Sig Dispense Refill  . albuterol (VENTOLIN HFA) 108 (90 Base) MCG/ACT inhaler INHALE 2 PUFFS INTO THE LUNGS EVERY 6 HOURS AS NEEDED FOR WHEEZING/SHORTNESS OF BREATH 18 Inhaler 5  . ALPRAZolam (XANAX) 0.5 MG tablet TAKE 1 TABLET BY MOUTH 3 TIMES A DAY 90 tablet 5  . aspirin 81 MG tablet Take 81 mg by mouth daily.      . carvedilol (COREG) 12.5 MG tablet Take 1 tablet (12.5 mg total) by mouth 2 (two) times daily. 180 tablet 3  . FLUoxetine (PROZAC) 40 MG capsule Take 2 capsules (80 mg total) by mouth daily. 180 capsule 3  . lisinopril (PRINIVIL,ZESTRIL) 20 MG tablet Take 1 tablet (20 mg total) by mouth daily. 90 tablet 2  . sildenafil (VIAGRA) 100 MG tablet Take 1 tablet (100 mg total) by mouth as needed for erectile dysfunction. 10 tablet 5  . simvastatin (ZOCOR) 40 MG tablet TAKE 1 TABLET BY MOUTH AT BEDTIME. 90 tablet 3  . SYNTHROID 112 MCG tablet Take 1 tablet (112 mcg total) by mouth daily before breakfast. 30 tablet 11  . SYNTHROID 112 MCG tablet TAKE 1 TABLET DAILY BEFORE BREAKFAST 30 tablet 2   No current facility-administered medications for this visit.      Past Medical History:  Diagnosis Date  . Allergy   . Anxiety   . Atrial fibrillation ([place])   .  Bell palsy   . Depression   . Diastolic heart failure   . Hypercholesterolemia   . Hypertension   . Pneumonia   . Thyroid disease     ROS:   All systems reviewed and negative except as noted in the HPI.   Past Surgical History:  Procedure Laterality Date  . CHOLECYSTECTOMY    . HERNIA REPAIR    . KNEE SURGERY    . VASECTOMY       Family History  Problem Relation Age of Onset  . Heart disease Mother   . Heart attack Mother   . Other Mother     thyroid problems  . Kidney cancer Mother   . Cirrhosis Father   . Other Father     thyroid problems  . Alcohol abuse Father   . Hypertension Father   . Mental illness Brother   . Parkinsonism Brother      Social History   Social History  . Marital status: Married    Spouse name: N/A  . Number of children: 1  . Years of education: N/A   Occupational History  . Not on file.   Social History Main Topics  . Smoking status: Current Every Day Smoker    Packs/day: 1.00    Years: 30.00  Types: Cigarettes  . Smokeless tobacco: Not on file  . Alcohol use No  . Drug use: No  . Sexual activity: No   Other Topics Concern  . 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      Tobacco: 1 ppd x 30 years      Alcohol: rare     Regular exercise: walking daily   Caffeine use: daily           BP (!) 148/100 (BP Location: Left Arm, Patient Position: Sitting, Cuff Size: Normal)   Pulse 64   Ht 5' 9.5" (1.765 m)   Wt 210 lb 3.2 oz (95.3 kg)   BMI 30.60 kg/m   Physical Exam:  Well appearing middle aged man, NAD HEENT: Unremarkable Neck:  6 cm JVD, no thyromegally Lymphatics:  No adenopathy Back:  No CVA tenderness Lungs:  Clear with no wheezes HEART:  Regular rate rhythm, no murmurs, no rubs, no clicks Abd:  soft, positive bowel sounds,  no organomegally, no rebound, no guarding Ext:  2 plus pulses, no edema, no cyanosis, no clubbing Skin:  No rashes no nodules Neuro:  CN II through XII intact, motor grossly intact  EKG - nsr   Assess/Plan: 1. Atrial fib - he has had no additional atrial fib since his thyroid was controlled. He has no indication for anti-coagulation. 2. HTN - his blood pressure has been under better control but he does have some white coat HTN. He is encouraged to lose weight and reduce his salt intake. 3. Hypothyroidism - he appears euthyroid on synthroid.  Mikle Bosworth.D.

## 2016-08-30 ENCOUNTER — Other Ambulatory Visit: Payer: Self-pay | Admitting: Endocrinology

## 2016-08-30 DIAGNOSIS — E890 Postprocedural hypothyroidism: Secondary | ICD-10-CM

## 2016-09-04 ENCOUNTER — Ambulatory Visit (INDEPENDENT_AMBULATORY_CARE_PROVIDER_SITE_OTHER): Payer: 59 | Admitting: Family Medicine

## 2016-09-04 ENCOUNTER — Encounter: Payer: Self-pay | Admitting: Family Medicine

## 2016-09-04 VITALS — BP 144/88 | HR 68 | Temp 98.5°F | Ht 69.5 in | Wt 208.2 lb

## 2016-09-04 DIAGNOSIS — I10 Essential (primary) hypertension: Secondary | ICD-10-CM

## 2016-09-04 DIAGNOSIS — B9789 Other viral agents as the cause of diseases classified elsewhere: Secondary | ICD-10-CM

## 2016-09-04 DIAGNOSIS — F17200 Nicotine dependence, unspecified, uncomplicated: Secondary | ICD-10-CM | POA: Diagnosis not present

## 2016-09-04 DIAGNOSIS — J069 Acute upper respiratory infection, unspecified: Secondary | ICD-10-CM | POA: Diagnosis not present

## 2016-09-04 DIAGNOSIS — R059 Cough, unspecified: Secondary | ICD-10-CM

## 2016-09-04 DIAGNOSIS — R05 Cough: Secondary | ICD-10-CM | POA: Diagnosis not present

## 2016-09-04 MED ORDER — BENZONATATE 100 MG PO CAPS: 100.0000 mg | ORAL_CAPSULE | Freq: Three times a day (TID) | ORAL | 0 refills | Status: DC | PRN

## 2016-09-04 MED ORDER — AMOXICILLIN-POT CLAVULANATE 875-125 MG PO TABS: 1.0000 | ORAL_TABLET | Freq: Two times a day (BID) | ORAL | 0 refills | Status: DC

## 2016-09-04 MED ORDER — HYDROCOD POLST-CPM POLST ER 10-8 MG/5ML PO SUER: 5.0000 mL | Freq: Two times a day (BID) | ORAL | 0 refills | Status: DC | PRN

## 2016-09-04 MED ORDER — IPRATROPIUM BROMIDE 0.03 % NA SOLN: 2.0000 | Freq: Four times a day (QID) | NASAL | 0 refills | Status: DC

## 2016-09-04 NOTE — Patient Instructions (Addendum)
Drink plenty of fluids and get enough rest  Take the benzonatate cough pills one or 2 pills 3 times daily as needed for daytime cough.   Use the Tussionex cough syrup 1 teaspoon every 12 hours. However it is sedating so you may want to not take it in the daytime and just use the benzonatate in the daytime and the Tussionex at night.  Continue using her albuterol inhaler.  Use Atrovent nasal 2 sprays each nostril every 6 hours as needed for head congestion and drainage.  If not improving in the next 3-4 days, or if getting worse in the meanwhile, go ahead and get the antibiotic prescription (Augmentin (amoxicillin/clavulanate)) filled and begin it.  Have someone take your blood pressure in about 2 weeks. It has come down to 140/88 now, but we would like it to be consistently below 140/90, preferably down toward 120/70. If it continues to stay high get rechecked   IF you received an x-ray today, you will receive an invoice from [facility]. Please contact [facility] at [phone] with questions or concerns regarding your invoice.   IF you received labwork today, you will receive an invoice from [facility]. Please contact [facility] at [phone] with questions or concerns regarding your invoice.   Our billing staff will not be able to assist you with questions regarding bills from these companies.  You will be contacted with the lab results as soon as they are available. The fastest way to get your results is to activate your My Chart account. Instructions are located on the last page of this paperwork. If you have not heard from [place] regarding the results in 2 weeks, please contact this office.

## 2016-09-04 NOTE — Progress Notes (Signed)
Patient ID: Jesse Europe [name], male    DOB: 23-Jun-1961  Age: 55 y.o. MRN: [id number]  Chief Complaint  Patient presents with  . Nasal Congestion    X 3-4 days Head & Chest congestion, runny nose, sore thorat    Subjective:   55 year old man who has a respiratory tract infection for the past 4 days. He has a history of wheezing problems, and is using his inhaler but doesn't work as well as it used to be says. He still smokes. We talked about that again. He has been blowing out purulent stuff and coughing up purulent stuff. He has not been feverish.  Current allergies, medications, problem list, past/family and social histories reviewed.  Objective:  BP (!) 172/100 (BP Location: Right Arm, Patient Position: Sitting, Cuff Size: Small)   Pulse 68   Temp 98.5 F (36.9 C) (Oral)   Ht 5' 9.5" (1.765 m)   Wt 208 lb 3.2 oz (94.4 kg)   SpO2 97%   BMI 30.30 kg/m   TMs normal. Alert and oriented. Throat clear. Neck supple without nodes or thyromegaly. Chest clear to auscultation. Heart regular without murmurs.  Assessment & Plan:   Assessment: No diagnosis found.    Plan: Per instructions. Advised to quit smoking again.  No orders of the defined types were placed in this encounter.   No orders of the defined types were placed in this encounter.        There are no Patient Instructions on file for this visit.   No Follow-up on file.   ,, MD 09/04/2016

## 2016-09-11 ENCOUNTER — Other Ambulatory Visit (INDEPENDENT_AMBULATORY_CARE_PROVIDER_SITE_OTHER): Payer: 59

## 2016-09-11 DIAGNOSIS — E890 Postprocedural hypothyroidism: Secondary | ICD-10-CM

## 2016-09-11 LAB — T4, FREE: Free T4: 0.86 ng/dL (ref 0.60–1.60)

## 2016-09-11 LAB — TSH: TSH: 1.91 u[IU]/mL (ref 0.35–4.50)

## 2016-09-17 ENCOUNTER — Encounter: Payer: Self-pay | Admitting: Endocrinology

## 2016-09-17 ENCOUNTER — Ambulatory Visit (INDEPENDENT_AMBULATORY_CARE_PROVIDER_SITE_OTHER): Payer: 59 | Admitting: Endocrinology

## 2016-09-17 VITALS — BP 130/78 | HR 76 | Ht 69.69 in | Wt 207.4 lb

## 2016-09-17 DIAGNOSIS — E890 Postprocedural hypothyroidism: Secondary | ICD-10-CM

## 2016-09-17 NOTE — Progress Notes (Signed)
Patient ID: Jesse Europe [name], male   DOB: 09/24/1960, 56 y.o.   MRN: [id number]    Reason for Appointment:  Hypothyroidism, followup visit   History of Present Illness:   The hypothyroidism was first diagnosed in 11/13. Previously had I-131 treatment for Graves' disease He was initially started with 88 mcg but his dose needed to be increased progressively  Complaints are reported by the patient now are none, no complaints of fatigue Still has no complaints of shakiness, sweating or palpitations  He has been treated with brand name SYNTHROID, now taking 112 mcg     His dose has been progressively reduced in early 2017            Compliance with the medical regimen has been as prescribed with taking the tablet in the morning before breakfast.  Wt Readings from Last 3 Encounters:  09/17/16 207 lb 6.4 oz (94.1 kg)  09/04/16 208 lb 3.2 oz (94.4 kg)  08/25/16 210 lb 3.2 oz (95.3 kg)    LABS:  TSH Is back to normal consistently now   Lab Results  Component Value Date   TSH 1.91 09/11/2016   TSH 3.55 04/30/2016   TSH 0.19 (L) 03/16/2016   FREET4 0.86 09/11/2016   FREET4 0.82 04/30/2016   FREET4 1.21 03/16/2016     Allergies as of 09/17/2016      Reactions   Sulfa Drugs Cross Reactors Hives   Codeine    Made him very jittery       Medication List       Accurate as of 09/17/16  4:29 PM. Always use your most recent med list.          albuterol 108 (90 Base) MCG/ACT inhaler Commonly known as:  VENTOLIN HFA INHALE 2 PUFFS INTO THE LUNGS EVERY 6 HOURS AS NEEDED FOR WHEEZING/SHORTNESS OF BREATH   ALPRAZolam 0.5 MG tablet Commonly known as:  XANAX TAKE 1 TABLET BY MOUTH 3 TIMES A DAY   aspirin 81 MG tablet Take 81 mg by mouth daily.   carvedilol 12.5 MG tablet Commonly known as:  COREG Take 1 tablet (12.5 mg total) by mouth 2 (two) times daily.   FLUoxetine 40 MG capsule Commonly known as:  PROZAC Take 2 capsules (80 mg total) by mouth daily.     ipratropium 0.03 % nasal spray Commonly known as:  ATROVENT Place 2 sprays into both nostrils 4 (four) times daily.   lisinopril 20 MG tablet Commonly known as:  PRINIVIL,ZESTRIL Take 1 tablet (20 mg total) by mouth daily.   sildenafil 100 MG tablet Commonly known as:  VIAGRA Take 1 tablet (100 mg total) by mouth as needed for erectile dysfunction.   simvastatin 40 MG tablet Commonly known as:  ZOCOR TAKE 1 TABLET BY MOUTH AT BEDTIME.   SYNTHROID 112 MCG tablet Generic drug:  levothyroxine Take 1 tablet (112 mcg total) by mouth daily before breakfast.       Past Medical History:  Diagnosis Date  . Allergy   . Anxiety   . Atrial fibrillation ([place])   . Bell palsy   . Depression   . Diastolic heart failure   . Hypercholesterolemia   . Hypertension   . Pneumonia   . Thyroid disease     Past Surgical History:  Procedure Laterality Date  . CHOLECYSTECTOMY    . HERNIA REPAIR    . KNEE SURGERY    . VASECTOMY      Family History  Problem  Relation Age of Onset  . Heart disease Mother   . Heart attack Mother   . Other Mother     thyroid problems  . Kidney cancer Mother   . Cirrhosis Father   . Other Father     thyroid problems  . Alcohol abuse Father   . Hypertension Father   . Mental illness Brother   . Parkinsonism Brother     Social History:  reports that he has been smoking Cigarettes.  He has a 30.00 pack-year smoking history. He has never used smokeless tobacco. He reports that he does not drink alcohol or use drugs.  Allergies:  Allergies  Allergen Reactions  . Sulfa Drugs Cross Reactors Hives  . Codeine     Made him very jittery     REVIEW of systems:  He has Gained back some weight   Wt Readings from Last 3 Encounters:  09/17/16 207 lb 6.4 oz (94.1 kg)  09/04/16 208 lb 3.2 oz (94.4 kg)  08/25/16 210 lb 3.2 oz (95.3 kg)    HYPERTENSION: He has had long-standing hypertension which is well-controlled, has been managed by PCP with  lisinopril and Coreg.    He has had a history of hypercholesterolemia followed by PCP  Asking about establishing with a new PCP    Examination:   BP 130/78   Pulse 76   Ht 5' 9.69" (1.77 m)   Wt 207 lb 6.4 oz (94.1 kg)   SpO2 96%   BMI 30.03 kg/m    He looks well. No peripheral edema    Assessments   Hypothyroidism, post I-131 ablation  Previously had been consistently well controlled with 150 g of brand name Synthroid Since 2017 has required lower doses for no apparent reason and is now taking 112 g He is subjectively doing well, he thinks he feels better when he keeps his weight down  TSH is now consistently normal  He will follow-up with [place] in about a year  Continue follow-up with PCP and cardiologist for other issues     , 09/17/2016, 4:29 PM

## 2016-10-15 ENCOUNTER — Other Ambulatory Visit: Payer: Self-pay | Admitting: Family Medicine

## 2016-10-16 ENCOUNTER — Telehealth: Payer: Self-pay | Admitting: *Deleted

## 2016-10-16 NOTE — Telephone Encounter (Signed)
Patient left a msg on the refill stating that Dr Lovena Le told him that if his BP remained elevated he would change his medication. [facility] [place]. Please advise. Thanks, MI

## 2016-10-16 NOTE — Telephone Encounter (Signed)
Called, spoke with pt. Pt stated his BP has been elevated for 2 weeks. Pt stated around 160s/90s. Reviewed cardiac medications. Pt stated he takes 2 tablets of Carvedilol in the morning. I informed pt the Rx is for Carvedilol 1 tablet (12.5 mg) twice daily. Pt stated he will start taking the medication twice daily tomorrow morning. Informed pt to keep log of BP for 2 weeks. If still elevated, I will forward information to Dr. Lovena Le to advise. Pt verbalized understanding.

## 2016-11-12 ENCOUNTER — Other Ambulatory Visit: Payer: Self-pay | Admitting: Physician Assistant

## 2016-11-14 ENCOUNTER — Other Ambulatory Visit: Payer: Self-pay | Admitting: Emergency Medicine

## 2016-11-16 NOTE — Telephone Encounter (Signed)
[facility], l/m needs ov for refills with new pcp

## 2016-11-16 NOTE — Telephone Encounter (Signed)
Please call and schedule an appt with the patient with his new PCP -- he needs to be seen without a month which is the amount of medications that I gave him.

## 2016-12-07 ENCOUNTER — Ambulatory Visit (INDEPENDENT_AMBULATORY_CARE_PROVIDER_SITE_OTHER): Payer: 59 | Admitting: Physician Assistant

## 2016-12-07 ENCOUNTER — Encounter: Payer: Self-pay | Admitting: Physician Assistant

## 2016-12-07 VITALS — BP 136/86 | HR 59 | Temp 98.6°F | Resp 16 | Ht 69.0 in | Wt 204.0 lb

## 2016-12-07 DIAGNOSIS — I10 Essential (primary) hypertension: Secondary | ICD-10-CM

## 2016-12-07 DIAGNOSIS — E890 Postprocedural hypothyroidism: Secondary | ICD-10-CM | POA: Diagnosis not present

## 2016-12-07 DIAGNOSIS — E785 Hyperlipidemia, unspecified: Secondary | ICD-10-CM | POA: Diagnosis not present

## 2016-12-07 DIAGNOSIS — J329 Chronic sinusitis, unspecified: Secondary | ICD-10-CM | POA: Diagnosis not present

## 2016-12-07 DIAGNOSIS — F418 Other specified anxiety disorders: Secondary | ICD-10-CM | POA: Diagnosis not present

## 2016-12-07 DIAGNOSIS — B9689 Other specified bacterial agents as the cause of diseases classified elsewhere: Secondary | ICD-10-CM | POA: Diagnosis not present

## 2016-12-07 DIAGNOSIS — Z8679 Personal history of other diseases of the circulatory system: Secondary | ICD-10-CM | POA: Diagnosis not present

## 2016-12-07 MED ORDER — AMOXICILLIN-POT CLAVULANATE 875-125 MG PO TABS: 1.0000 | ORAL_TABLET | Freq: Two times a day (BID) | ORAL | 0 refills | Status: DC

## 2016-12-07 MED ORDER — ALPRAZOLAM 0.5 MG PO TABS: 0.5000 mg | ORAL_TABLET | Freq: Three times a day (TID) | ORAL | 0 refills | Status: DC

## 2016-12-07 MED ORDER — BENZONATATE 100 MG PO CAPS: 100.0000 mg | ORAL_CAPSULE | Freq: Two times a day (BID) | ORAL | 0 refills | Status: DC | PRN

## 2016-12-07 NOTE — Progress Notes (Signed)
Pre visit review using our clinic review tool, if applicable. No additional management support is needed unless otherwise documented below in the visit note. 

## 2016-12-07 NOTE — Progress Notes (Signed)
Patient presents to clinic today to establish care.  Acute Concerns: Patient endorses 1+ weeks of nasal congestion with sinus pressure and sinus pain (mostly frontal). Denies ear pain and tooth pain. Denies fever but has noted some chest congestion with cough that is productive of green sputum.  Has not taken anything for symptoms.  Chronic Issues: Hypertension -- Is currently on regimen of Carvedilol and lisinopril. Is taking medications as directed. Patient denies chest pain, palpitations, lightheadedness, dizziness, vision changes or frequent headaches. Patient without history of stroke or heart attack. Has history of Atrial Fibrillation 2/2 thyroid abnormality. Was followed by Cardiology but was finally released in December of last year due to no recurrence of arrhythmia since thyroid ablation. They did not recommend anticoagulation.   BP Readings from Last 3 Encounters:  12/07/16 136/86  09/17/16 130/78  09/04/16 (!) 144/88   Hypothyroidism -- Currently on synthroid 112 mcg daily. Is taking as directed.  Hyperlipidemia -- Is currently on a regimen of Simvastatin 40 mg. Is taking once daily. Denies myalgias.  Depression and Anxiety -- Is currently on Fluoxetine 80 mg daily. Has been on this regimen for quite some time. Is taking Xanax twice daily. Denies si/HI.   Past Medical History:  Diagnosis Date  . Allergy   . Anxiety   . Atrial fibrillation ([place])   . Bell palsy   . Depression   . Diastolic heart failure   . Hypercholesterolemia   . Hypertension   . Pneumonia   . Thyroid disease     Past Surgical History:  Procedure Laterality Date  . CHOLECYSTECTOMY    . HERNIA REPAIR    . KNEE SURGERY    . VASECTOMY      Current Outpatient Prescriptions on File Prior to Visit  Medication Sig Dispense Refill  . albuterol (VENTOLIN HFA) 108 (90 Base) MCG/ACT inhaler INHALE 2 PUFFS INTO THE LUNGS EVERY 6 HOURS AS NEEDED FOR WHEEZING/SHORTNESS OF BREATH 18 Inhaler 5  . aspirin  81 MG tablet Take 81 mg by mouth daily.      . carvedilol (COREG) 12.5 MG tablet Take 1 tablet (12.5 mg total) by mouth 2 (two) times daily. 180 tablet 3  . FLUoxetine (PROZAC) 40 MG capsule Take 2 capsules (80 mg total) by mouth daily. 180 capsule 3  . ipratropium (ATROVENT) 0.03 % nasal spray PLACE 2 SPRAYS INTO BOTH NOSTRILS 4 (FOUR) TIMES DAILY. 30 mL 1  . lisinopril (PRINIVIL,ZESTRIL) 20 MG tablet Take 1 tablet (20 mg total) by mouth daily. 90 tablet 2  . simvastatin (ZOCOR) 40 MG tablet TAKE 1 TABLET BY MOUTH AT BEDTIME. 90 tablet 3  . SYNTHROID 112 MCG tablet Take 1 tablet (112 mcg total) by mouth daily before breakfast. 30 tablet 11   No current facility-administered medications on file prior to visit.     Allergies  Allergen Reactions  . Sulfa Drugs Cross Reactors Hives  . Codeine Other (See Comments)    Made him very jittery     Family History  Problem Relation Age of Onset  . Heart disease Mother   . Heart attack Mother   . Other Mother     thyroid problems  . Kidney cancer Mother   . Cirrhosis Father   . Other Father     thyroid problems  . Alcohol abuse Father   . Hypertension Father   . Mental illness Brother   . Parkinsonism Brother     Social History   Social History  . Marital  status: Married    Spouse name: N/A  . Number of children: 1  . Years of education: N/A   Occupational History  . Not on file.   Social History Main Topics  . Smoking status: Current Every Day Smoker    Packs/day: 1.00    Years: 30.00    Types: Cigarettes  . Smokeless tobacco: Never Used  . Alcohol use 0.0 oz/week  . Drug use: No  . Sexual activity: No   Other Topics Concern  . 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  HENT: Positive for congestion, ear pain, sinus pain and sore throat. Negative for ear discharge, hearing loss, nosebleeds and tinnitus.   Eyes: Negative for blurred vision and pain.  Respiratory: Positive for cough and sputum production. Negative for shortness of breath and wheezing.   Cardiovascular: Negative for chest pain and palpitations.  Musculoskeletal: Negative for joint pain and myalgias.  Neurological: Negative for dizziness and loss of consciousness.  Psychiatric/Behavioral: Negative for depression, hallucinations, substance abuse and suicidal ideas. The patient is not nervous/anxious and does not have insomnia.    BP 136/86   Pulse (!) 59   Temp 98.6 F (37 C) (Oral)   Resp 16   Ht 5\' 9"  (1.753 m)   Wt 204 lb (92.5 kg)   SpO2 95%   BMI 30.13 kg/m   Physical Exam  Constitutional: He is well-developed, well-nourished, and in no distress.  HENT:  Head: Normocephalic and atraumatic.  Right Ear: Tympanic membrane and external ear normal.  Left Ear: Tympanic membrane and external ear normal.  Nose: Mucosal edema and rhinorrhea present. Right sinus exhibits maxillary sinus tenderness. Left sinus exhibits maxillary sinus tenderness.  Mouth/Throat: Uvula is midline, oropharynx is clear and moist and mucous membranes are normal.  Eyes: Conjunctivae are normal.  Neck: Neck supple.  Cardiovascular: Normal rate, regular rhythm, normal heart sounds and intact distal pulses.   Pulmonary/Chest: Effort normal and breath sounds normal. No respiratory distress. He has no wheezes. He has no rales. He exhibits no tenderness.  Lymphadenopathy:    He has no cervical adenopathy.  Vitals reviewed.   Recent Results (from the past 2160 hour(s))  TSH     Status: None   Collection Time: 09/11/16  8:51 AM  Result Value Ref Range   TSH 1.91 0.35 - 4.50 uIU/mL  T4,  free     Status: None   Collection Time: 09/11/16  8:51 AM  Result Value Ref Range   Free T4 0.86 0.60 - 1.60 ng/dL    Comment: Specimens from patients who are undergoing biotin therapy and /or ingesting biotin supplements may contain high levels of biotin.  The higher biotin concentration in these specimens interferes with this Free T4 assay.  Specimens that contain high levels  of biotin may cause false high results for this Free T4 assay.  Please interpret results in light of the total clinical presentation of the patient.  Assessment/Plan: Hypothyroidism following radioiodine therapy Continue Synthroid. CPE scheduled. Will repeat labs at that time.  Hypertension Stable BP. Asymptomatic. Continue current regimen. CPE scheduled. Will check labs at that appointment.  Dyslipidemia Tolerating statin without side effect. Taking 81 mg ASA daily. Continue current regimen. Fasting labs at CPE.  Depression with anxiety Stable on current medication regimen. Continue same. Will work on weaning the Alprazolam at subsequent visits.  Bacterial sinusitis Rx augmentin.  Increase fluids.  Rest.  Saline nasal spray.  Probiotic.  Mucinex as directed.  Humidifier in bedroom. Tessalon per orders.  Call or return to clinic if symptoms are not improving.   History of atrial fibrillation Changed to history of atrial fibrillation as patient without issue since iodine ablation of thyroid. Will monitor.    Leeanne Rio, PA-C

## 2016-12-07 NOTE — Patient Instructions (Signed)
Please take antibiotic as directed.  Increase fluid intake.  Use Saline nasal spray.  Take a daily multivitamin. Use Tessalon as directed.  Place a humidifier in the bedroom.  Please call or return clinic if symptoms are not improving.  Follow-up in the next 3-4 weeks for a follow-up regarding chronic issues.  Your previous PCP has filled medications for a year so no further refills are needed at present.   Sinusitis Sinusitis is redness, soreness, and swelling (inflammation) of the paranasal sinuses. Paranasal sinuses are air pockets within the bones of your face (beneath the eyes, the middle of the forehead, or above the eyes). In healthy paranasal sinuses, mucus is able to drain out, and air is able to circulate through them by way of your nose. However, when your paranasal sinuses are inflamed, mucus and air can become trapped. This can allow bacteria and other germs to grow and cause infection. Sinusitis can develop quickly and last only a short time (acute) or continue over a long period (chronic). Sinusitis that lasts for more than 12 weeks is considered chronic.  CAUSES  Causes of sinusitis include:  Allergies.  Structural abnormalities, such as displacement of the cartilage that separates your nostrils (deviated septum), which can decrease the air flow through your nose and sinuses and affect sinus drainage.  Functional abnormalities, such as when the small hairs (cilia) that line your sinuses and help remove mucus do not work properly or are not present. SYMPTOMS  Symptoms of acute and chronic sinusitis are the same. The primary symptoms are pain and pressure around the affected sinuses. Other symptoms include:  Upper toothache.  Earache.  Headache.  Bad breath.  Decreased sense of smell and taste.  A cough, which worsens when you are lying flat.  Fatigue.  Fever.  Thick drainage from your nose, which often is green and may contain pus (purulent).  Swelling and warmth  over the affected sinuses. DIAGNOSIS  Your caregiver will perform a physical exam. During the exam, your caregiver may:  Look in your nose for signs of abnormal growths in your nostrils (nasal polyps).  Tap over the affected sinus to check for signs of infection.  View the inside of your sinuses (endoscopy) with a special imaging device with a light attached (endoscope), which is inserted into your sinuses. If your caregiver suspects that you have chronic sinusitis, one or more of the following tests may be recommended:  Allergy tests.  Nasal culture A sample of mucus is taken from your nose and sent to a lab and screened for bacteria.  Nasal cytology A sample of mucus is taken from your nose and examined by your caregiver to determine if your sinusitis is related to an allergy. TREATMENT  Most cases of acute sinusitis are related to a viral infection and will resolve on their own within 10 days. Sometimes medicines are prescribed to help relieve symptoms (pain medicine, decongestants, nasal steroid sprays, or saline sprays).  However, for sinusitis related to a bacterial infection, your caregiver will prescribe antibiotic medicines. These are medicines that will help kill the bacteria causing the infection.  Rarely, sinusitis is caused by a fungal infection. In theses cases, your caregiver will prescribe antifungal medicine. For some cases of chronic sinusitis, surgery is needed. Generally, these are cases in which sinusitis recurs more than 3 times per year, despite other treatments. HOME CARE INSTRUCTIONS   Drink plenty of water. Water helps thin the mucus so your sinuses can drain more easily.  Use  a humidifier.  Inhale steam 3 to 4 times a day (for example, sit in the bathroom with the shower running).  Apply a warm, moist washcloth to your face 3 to 4 times a day, or as directed by your caregiver.  Use saline nasal sprays to help moisten and clean your sinuses.  Take  over-the-counter or prescription medicines for pain, discomfort, or fever only as directed by your caregiver. SEEK IMMEDIATE MEDICAL CARE IF:  You have increasing pain or severe headaches.  You have nausea, vomiting, or drowsiness.  You have swelling around your face.  You have vision problems.  You have a stiff neck.  You have difficulty breathing. MAKE SURE YOU:   Understand these instructions.  Will watch your condition.  Will get help right away if you are not doing well or get worse. Document Released: 08/31/2005 Document Revised: 11/23/2011 Document Reviewed: 09/15/2011 [facility] Patient Information 2014 [place], [place].

## 2016-12-08 DIAGNOSIS — B9689 Other specified bacterial agents as the cause of diseases classified elsewhere: Secondary | ICD-10-CM | POA: Insufficient documentation

## 2016-12-08 DIAGNOSIS — J329 Chronic sinusitis, unspecified: Principal | ICD-10-CM

## 2016-12-08 NOTE — Assessment & Plan Note (Signed)
>>  ASSESSMENT AND PLAN FOR HYPOTHYROIDISM FOLLOWING RADIOIODINE THERAPY WRITTEN ON 12/08/2016  8:27 AM BY Marcelline Mates C, PA-C  Continue Synthroid. CPE scheduled. Will repeat labs at that time.

## 2016-12-08 NOTE — Assessment & Plan Note (Signed)
Rx augmentin.  Increase fluids.  Rest.  Saline nasal spray.  Probiotic.  Mucinex as directed.  Humidifier in bedroom. Tessalon per orders.  Call or return to clinic if symptoms are not improving.

## 2016-12-08 NOTE — Assessment & Plan Note (Signed)
Changed to history of atrial fibrillation as patient without issue since iodine ablation of thyroid. Will monitor.

## 2016-12-08 NOTE — Assessment & Plan Note (Signed)
Stable BP. Asymptomatic. Continue current regimen. CPE scheduled. Will check labs at that appointment.

## 2016-12-08 NOTE — Assessment & Plan Note (Signed)
Tolerating statin without side effect. Taking 81 mg ASA daily. Continue current regimen. Fasting labs at CPE.

## 2016-12-08 NOTE — Assessment & Plan Note (Signed)
Continue Synthroid. CPE scheduled. Will repeat labs at that time.

## 2016-12-08 NOTE — Assessment & Plan Note (Signed)
>>  ASSESSMENT AND PLAN FOR DYSLIPIDEMIA WRITTEN ON 12/08/2016  8:28 AM BY Waldon Merl, PA-C  Tolerating statin without side effect. Taking 81 mg ASA daily. Continue current regimen. Fasting labs at CPE.

## 2016-12-08 NOTE — Assessment & Plan Note (Signed)
Stable on current medication regimen. Continue same. Will work on weaning the Alprazolam at subsequent visits.

## 2016-12-10 ENCOUNTER — Telehealth: Payer: Self-pay | Admitting: Physician Assistant

## 2016-12-10 MED ORDER — DOXYCYCLINE HYCLATE 100 MG PO TABS: 100.0000 mg | ORAL_TABLET | Freq: Two times a day (BID) | ORAL | 0 refills | Status: AC

## 2016-12-10 NOTE — Telephone Encounter (Signed)
Advised patient to stop the Augmentin Rx sent to the pharmacy for the Doxycycline bid x 7 days Try Delysm OTC for the cough. He is agreeable. If symptoms persist will need to re-evaluate in the office.

## 2016-12-10 NOTE — Telephone Encounter (Signed)
Stop Augmentin. Start Doxycycline 100 mg BID x 7 days instead. OTC Delsym for cough if tessalon is not helping.   Follow-up if not improving.

## 2016-12-10 NOTE — Telephone Encounter (Signed)
Pt states that the Rx amoxicillin is making him feel bad and states that the benzonatate is not helping with his cough and was wanting to know if something else could be called in for him. I advise pt that Jesse Suarez was out of the office until Monday, pt states that he may go to ER if we are unable to call something in.

## 2017-01-04 ENCOUNTER — Other Ambulatory Visit: Payer: Self-pay | Admitting: Emergency Medicine

## 2017-01-04 NOTE — Telephone Encounter (Signed)
Patient recently established with PA Hassell Done at [place]; will forward to [place].

## 2017-01-06 ENCOUNTER — Encounter: Payer: Self-pay | Admitting: Physician Assistant

## 2017-01-06 ENCOUNTER — Ambulatory Visit (INDEPENDENT_AMBULATORY_CARE_PROVIDER_SITE_OTHER): Payer: 59 | Admitting: Physician Assistant

## 2017-01-06 VITALS — BP 120/64 | HR 63 | Temp 98.5°F | Resp 14 | Ht 69.0 in | Wt 203.0 lb

## 2017-01-06 DIAGNOSIS — I498 Other specified cardiac arrhythmias: Secondary | ICD-10-CM

## 2017-01-06 DIAGNOSIS — E785 Hyperlipidemia, unspecified: Secondary | ICD-10-CM

## 2017-01-06 DIAGNOSIS — I10 Essential (primary) hypertension: Secondary | ICD-10-CM

## 2017-01-06 DIAGNOSIS — I493 Ventricular premature depolarization: Secondary | ICD-10-CM | POA: Insufficient documentation

## 2017-01-06 LAB — LIPID PANEL
Cholesterol: 154 mg/dL (ref <200)
HDL: 38 mg/dL — ABNORMAL LOW (ref >40)
LDL Cholesterol: 72 mg/dL (ref <100)
Total CHOL/HDL Ratio: 4.1 Ratio (ref <5.0)
Triglycerides: 221 mg/dL — ABNORMAL HIGH (ref <150)
VLDL: 44 mg/dL — ABNORMAL HIGH (ref <30)

## 2017-01-06 LAB — COMPREHENSIVE METABOLIC PANEL
ALT: 15 U/L (ref 9–46)
AST: 16 U/L (ref 10–35)
Albumin: 4.2 g/dL (ref 3.6–5.1)
Alkaline Phosphatase: 76 U/L (ref 40–115)
BUN: 9 mg/dL (ref 7–25)
CO2: 25 mmol/L (ref 20–31)
Calcium: 8.9 mg/dL (ref 8.6–10.3)
Chloride: 102 mmol/L (ref 98–110)
Creat: 0.98 mg/dL (ref 0.70–1.33)
Glucose, Bld: 84 mg/dL (ref 65–99)
Potassium: 4.6 mmol/L (ref 3.5–5.3)
Sodium: 136 mmol/L (ref 135–146)
Total Bilirubin: 0.4 mg/dL (ref 0.2–1.2)
Total Protein: 6.9 g/dL (ref 6.1–8.1)

## 2017-01-06 NOTE — Progress Notes (Signed)
Pre visit review using our clinic review tool, if applicable. No additional management support is needed unless otherwise documented below in the visit note. 

## 2017-01-06 NOTE — Patient Instructions (Addendum)
Please go to the lab for blood work. We will call you with your results.  We are setting you back up with Dr. Lovena Le.  giving the abnormality on EKG just to make sure he does not want to adjust medications.   We will call you once we have gotten you an appointment.  Continue chronic medications as directed for now.  Schedule an appointment for a complete physical.  If you note any racing heart, lightheadedness or dizziness or any chest discomfort, please call 911 or go to the ER.   DASH Eating Plan DASH stands for "Dietary Approaches to Stop Hypertension." The DASH eating plan is a healthy eating plan that has been shown to reduce high blood pressure (hypertension). It may also reduce your risk for type 2 diabetes, heart disease, and stroke. The DASH eating plan may also help with weight loss. What are tips for following this plan? General guidelines   Avoid eating more than 2,300 mg (milligrams) of salt (sodium) a day. If you have hypertension, you may need to reduce your sodium intake to 1,500 mg a day.  Limit alcohol intake to no more than 1 drink a day for nonpregnant women and 2 drinks a day for men. One drink equals 12 oz of beer, 5 oz of wine, or 1 oz of hard liquor.  Work with your health care provider to maintain a healthy body weight or to lose weight. Ask what an ideal weight is for you.  Get at least 30 minutes of exercise that causes your heart to beat faster (aerobic exercise) most days of the week. Activities may include walking, swimming, or biking.  Work with your health care provider or diet and nutrition specialist (dietitian) to adjust your eating plan to your individual calorie needs. Reading food labels   Check food labels for the amount of sodium per serving. Choose foods with less than 5 percent of the Daily Value of sodium. Generally, foods with less than 300 mg of sodium per serving fit into this eating plan.  To find whole grains, look for the word "whole"  as the first word in the ingredient list. Shopping   Buy products labeled as "low-sodium" or "no salt added."  Buy fresh foods. Avoid canned foods and premade or frozen meals. Cooking   Avoid adding salt when cooking. Use salt-free seasonings or herbs instead of table salt or sea salt. Check with your health care provider or pharmacist before using salt substitutes.  Do not fry foods. Cook foods using healthy methods such as baking, boiling, grilling, and broiling instead.  Cook with heart-healthy oils, such as olive, canola, soybean, or sunflower oil. Meal planning    Eat a balanced diet that includes:  5 or more servings of fruits and vegetables each day. At each meal, try to fill half of your plate with fruits and vegetables.  Up to 6-8 servings of whole grains each day.  Less than 6 oz of lean meat, poultry, or fish each day. A 3-oz serving of meat is about the same size as a deck of cards. One egg equals 1 oz.  2 servings of low-fat dairy each day.  A serving of nuts, seeds, or beans 5 times each week.  Heart-healthy fats. Healthy fats called Omega-3 fatty acids are found in foods such as flaxseeds and coldwater fish, like sardines, salmon, and mackerel.  Limit how much you eat of the following:  Canned or prepackaged foods.  Food that is high in trans fat,  such as fried foods.  Food that is high in saturated fat, such as fatty meat.  Sweets, desserts, sugary drinks, and other foods with added sugar.  Full-fat dairy products.  Do not salt foods before eating.  Try to eat at least 2 vegetarian meals each week.  Eat more home-cooked food and less restaurant, buffet, and fast food.  When eating at a restaurant, ask that your food be prepared with less salt or no salt, if possible. What foods are recommended? The items listed may not be a complete list. Talk with your dietitian about what dietary choices are best for you. Grains  Whole-grain or whole-wheat bread.  Whole-grain or whole-wheat pasta. Brown rice. Modena Morrow. Bulgur. Whole-grain and low-sodium cereals. Pita bread. Low-fat, low-sodium crackers. Whole-wheat flour tortillas. Vegetables  Fresh or frozen vegetables (raw, steamed, roasted, or grilled). Low-sodium or reduced-sodium tomato and vegetable juice. Low-sodium or reduced-sodium tomato sauce and tomato paste. Low-sodium or reduced-sodium canned vegetables. Fruits  All fresh, dried, or frozen fruit. Canned fruit in natural juice (without added sugar). Meat and other protein foods  Skinless chicken or [place]. Ground chicken or [place]. Pork with fat trimmed off. Fish and seafood. Egg whites. Dried beans, peas, or lentils. Unsalted nuts, nut butters, and seeds. Unsalted canned beans. Lean cuts of beef with fat trimmed off. Low-sodium, lean deli meat. Dairy  Low-fat (1%) or fat-free (skim) milk. Fat-free, low-fat, or reduced-fat cheeses. Nonfat, low-sodium ricotta or cottage cheese. Low-fat or nonfat yogurt. Low-fat, low-sodium cheese. Fats and oils  Soft margarine without trans fats. Vegetable oil. Low-fat, reduced-fat, or light mayonnaise and salad dressings (reduced-sodium). Canola, safflower, olive, soybean, and sunflower oils. Avocado. Seasoning and other foods  Herbs. Spices. Seasoning mixes without salt. Unsalted popcorn and pretzels. Fat-free sweets. What foods are not recommended? The items listed may not be a complete list. Talk with your dietitian about what dietary choices are best for you. Grains  Baked goods made with fat, such as croissants, muffins, or some breads. Dry pasta or rice meal packs. Vegetables  Creamed or fried vegetables. Vegetables in a cheese sauce. Regular canned vegetables (not low-sodium or reduced-sodium). Regular canned tomato sauce and paste (not low-sodium or reduced-sodium). Regular tomato and vegetable juice (not low-sodium or reduced-sodium). Angie Fava. Olives. Fruits  Canned fruit in a light or heavy  syrup. Fried fruit. Fruit in cream or butter sauce. Meat and other protein foods  Fatty cuts of meat. Ribs. Fried meat. Berniece Salines. Sausage. Bologna and other processed lunch meats. Salami. Fatback. Hotdogs. Bratwurst. Salted nuts and seeds. Canned beans with added salt. Canned or smoked fish. Whole eggs or egg yolks. Chicken or [place] with skin. Dairy  Whole or 2% milk, cream, and half-and-half. Whole or full-fat cream cheese. Whole-fat or sweetened yogurt. Full-fat cheese. Nondairy creamers. Whipped toppings. Processed cheese and cheese spreads. Fats and oils  Butter. Stick margarine. Lard. Shortening. Ghee. Bacon fat. Tropical oils, such as coconut, palm kernel, or palm oil. Seasoning and other foods  Salted popcorn and pretzels. Onion salt, garlic salt, seasoned salt, table salt, and sea salt. Worcestershire sauce. Tartar sauce. Barbecue sauce. Teriyaki sauce. Soy sauce, including reduced-sodium. Steak sauce. Canned and packaged gravies. Fish sauce. Oyster sauce. Cocktail sauce. Horseradish that you find on the shelf. Ketchup. Mustard. Meat flavorings and tenderizers. Bouillon cubes. Hot sauce and Tabasco sauce. Premade or packaged marinades. Premade or packaged taco seasonings. Relishes. Regular salad dressings. Where to find more information:  National Heart, Lung, and [place]: https://wilson-eaton.com/  American Heart Association: www.heart.org  Summary  The DASH eating plan is a healthy eating plan that has been shown to reduce high blood pressure (hypertension). It may also reduce your risk for type 2 diabetes, heart disease, and stroke.  With the DASH eating plan, you should limit salt (sodium) intake to 2,300 mg a day. If you have hypertension, you may need to reduce your sodium intake to 1,500 mg a day.  When on the DASH eating plan, aim to eat more fresh fruits and vegetables, whole grains, lean proteins, low-fat dairy, and heart-healthy fats.  Work with your health care provider or diet  and nutrition specialist (dietitian) to adjust your eating plan to your individual calorie needs. This information is not intended to replace advice given to you by your health care provider. Make sure you discuss any questions you have with your health care provider. Document Released: 08/20/2011 Document Revised: 08/24/2016 Document Reviewed: 08/24/2016 Elsevier Interactive Patient Education  2017 [employer].

## 2017-01-06 NOTE — Progress Notes (Signed)
Patient presents to clinic today for follow-up of hyperlipidemia.  Patient is currently on Zocor 40 mg daily. Is taking as directed. Body mass index is 29.98 kg/m. Is working on exercise. Endorses a well-balanced diet.   Past Medical History:  Diagnosis Date  . Allergy   . Anxiety   . Atrial fibrillation ([place])   . Bell palsy   . Depression   . Diastolic heart failure   . Hypercholesterolemia   . Hypertension   . Pneumonia   . Thyroid disease     Current Outpatient Prescriptions on File Prior to Visit  Medication Sig Dispense Refill  . ALPRAZolam (XANAX) 0.5 MG tablet Take 1 tablet (0.5 mg total) by mouth 3 (three) times daily. 90 tablet 0  . aspirin 81 MG tablet Take 81 mg by mouth every other day.     . carvedilol (COREG) 12.5 MG tablet Take 1 tablet (12.5 mg total) by mouth 2 (two) times daily. 180 tablet 3  . FLUoxetine (PROZAC) 40 MG capsule Take 2 capsules (80 mg total) by mouth daily. 180 capsule 3  . ipratropium (ATROVENT) 0.03 % nasal spray PLACE 2 SPRAYS INTO BOTH NOSTRILS 4 (FOUR) TIMES DAILY. 30 mL 1  . lisinopril (PRINIVIL,ZESTRIL) 20 MG tablet Take 1 tablet (20 mg total) by mouth daily. 90 tablet 2  . simvastatin (ZOCOR) 40 MG tablet TAKE 1 TABLET BY MOUTH AT BEDTIME. 90 tablet 3  . SYNTHROID 112 MCG tablet Take 1 tablet (112 mcg total) by mouth daily before breakfast. 30 tablet 11  . VENTOLIN HFA 108 (90 Base) MCG/ACT inhaler INHALE 2 PUFFS INTO THE LUNGS EVERY 6 HOURS AS NEEDED FOR WHEEZING/SHORTNESS OF BREATH 18 Inhaler 5   No current facility-administered medications on file prior to visit.     Allergies  Allergen Reactions  . Sulfa Drugs Cross Reactors Hives  . Codeine Other (See Comments)    Made him very jittery     Family History  Problem Relation Age of Onset  . Heart disease Mother   . Heart attack Mother   . Other Mother     thyroid problems  . Kidney cancer Mother   . Cirrhosis Father   . Other Father     thyroid problems  . Alcohol abuse  Father   . Hypertension Father   . Mental illness Brother   . Parkinsonism Brother     Social History   Social History  . Marital status: Married    Spouse name: N/A  . Number of children: 1  . Years of education: N/A   Social History Main Topics  . Smoking status: Current Every Day Smoker    Packs/day: 1.00    Years: 30.00    Types: Cigarettes  . Smokeless tobacco: Never Used  . Alcohol use 0.0 oz/week     Comment: rarely  . Drug use: No  . Sexual activity: No   Other Topics Concern  . 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  All other ROS are negative.  BP 120/64   Pulse 63   Temp 98.5 F (36.9 C) (Oral)   Resp 14   Ht '5\' 9"'  (1.753 m)   Wt 203 lb (92.1 kg)   SpO2 97%   BMI 29.98 kg/m   Physical Exam  Constitutional: He is oriented to person, place, and time and well-developed, well-nourished, and in no distress.  HENT:  Head: Normocephalic and atraumatic.  Eyes: Conjunctivae are normal.  Neck: Neck supple. No thyromegaly present.  Cardiovascular: Normal rate, regular rhythm and intact distal pulses.   Extrasystoles are present.  Pulmonary/Chest: Effort normal and breath sounds normal. No respiratory distress. He has no wheezes. He has no rales. He exhibits no tenderness.  Lymphadenopathy:    He has no cervical adenopathy.  Neurological: He is alert and oriented to person, place, and time.  Skin: Skin is warm and dry. No rash noted.  Psychiatric: Affect normal.  Vitals reviewed.  Assessment/Plan: 1. Ventricular trigeminy PVCs noted on auscultation. Radial pulses 2+ and equal bilaterally. EKG reveals ventricular trigeminy in sinus  rhythm. Asymptomatic. BP stable. Will set patient back up with his EP, Dr. Lovena Le, for further assessment. Alarm signs/symptoms discussed with patient.  - EKG 12-Lead  2. Essential hypertension BP stable. Repeat labs today. - Comp Met (CMET) - Lipid panel  3. Dyslipidemia Will obtain lipid panel today. Discussed dietary and exercise recommendations. Will alter medications based on results.   Leeanne Rio, PA-C

## 2017-01-08 ENCOUNTER — Other Ambulatory Visit: Payer: Self-pay | Admitting: Physician Assistant

## 2017-01-08 ENCOUNTER — Encounter: Payer: Self-pay | Admitting: Internal Medicine

## 2017-01-12 ENCOUNTER — Encounter (INDEPENDENT_AMBULATORY_CARE_PROVIDER_SITE_OTHER): Payer: Self-pay

## 2017-01-12 ENCOUNTER — Encounter: Payer: Self-pay | Admitting: Internal Medicine

## 2017-01-12 ENCOUNTER — Ambulatory Visit (INDEPENDENT_AMBULATORY_CARE_PROVIDER_SITE_OTHER): Payer: 59 | Admitting: Internal Medicine

## 2017-01-12 VITALS — BP 150/84 | HR 35 | Ht 69.0 in | Wt 201.0 lb

## 2017-01-12 DIAGNOSIS — I493 Ventricular premature depolarization: Secondary | ICD-10-CM

## 2017-01-12 NOTE — Progress Notes (Signed)
HPI Jesse Suarez returns today for followup. He is a pleasant 56 yo man with a h/o HTN, sinus node dysfunction, COPD and atrial fibrillation in the setting of thyroid storm which occurred almost 6 years ago. He has done well in the interim with no symptomatic atrial fib. During his thyrotoxic period, he was difficult to control but since then he has done well with NSR. He is now off of systemic anti-coagulation and has had no atrial fibrillation. He was in his medical MD's office a week or so ago and noted to have PVC's in a trigeminal pattern. He is referred for additional evaluation. He denies chest pain, sob, palpitations, syncope or edema. Allergies  Allergen Reactions  . Sulfa Drugs Cross Reactors Hives  . Codeine Other (See Comments)    Made him very jittery      Current Outpatient Prescriptions  Medication Sig Dispense Refill  . ALPRAZolam (XANAX) 0.5 MG tablet Take 1 tablet (0.5 mg total) by mouth 3 (three) times daily. 90 tablet 0  . aspirin 81 MG tablet Take 81 mg by mouth every other day.     . carvedilol (COREG) 12.5 MG tablet Take 1 tablet (12.5 mg total) by mouth 2 (two) times daily. 180 tablet 3  . FLUoxetine (PROZAC) 40 MG capsule Take 2 capsules (80 mg total) by mouth daily. 180 capsule 3  . ipratropium (ATROVENT) 0.03 % nasal spray Place 2 sprays into both nostrils 3 (three) times daily. 30 mL 1  . levocetirizine (XYZAL) 5 MG tablet Take 5 mg by mouth every evening.    Marland Kitchen lisinopril (PRINIVIL,ZESTRIL) 20 MG tablet Take 1 tablet (20 mg total) by mouth daily. 90 tablet 2  . simvastatin (ZOCOR) 40 MG tablet TAKE 1 TABLET BY MOUTH AT BEDTIME. 90 tablet 3  . SYNTHROID 112 MCG tablet Take 1 tablet (112 mcg total) by mouth daily before breakfast. 30 tablet 11  . VENTOLIN HFA 108 (90 Base) MCG/ACT inhaler INHALE 2 PUFFS INTO THE LUNGS EVERY 6 HOURS AS NEEDED FOR WHEEZING/SHORTNESS OF BREATH 18 Inhaler 5   No current facility-administered medications for this visit.       Past Medical History:  Diagnosis Date  . Allergy   . Anxiety   . Atrial fibrillation ([place])   . Bell palsy   . Depression   . Diastolic heart failure   . Hypercholesterolemia   . Hypertension   . Pneumonia   . Thyroid disease     ROS:   All systems reviewed and negative except as noted in the HPI.   Past Surgical History:  Procedure Laterality Date  . CHOLECYSTECTOMY    . HERNIA REPAIR    . KNEE SURGERY    . VASECTOMY       Family History  Problem Relation Age of Onset  . Heart disease Mother   . Heart attack Mother   . Other Mother     thyroid problems  . Kidney cancer Mother   . Cirrhosis Father   . Other Father     thyroid problems  . Alcohol abuse Father   . Hypertension Father   . Mental illness Brother   . Parkinsonism Brother      Social History   Social History  . Marital status: Married    Spouse name: N/A  . Number of children: 1  . Years of education: N/A   Occupational History  . Not on file.   Social History Main Topics  . Smoking  status: Current Every Day Smoker    Packs/day: 1.00    Years: 30.00    Types: Cigarettes  . Smokeless tobacco: Never Used  . Alcohol use 0.0 oz/week     Comment: rarely  . Drug use: No  . Sexual activity: No   Other Topics Concern  . 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      Tobacco: 1 ppd x 30 years      Alcohol: rare     Regular exercise: walking daily   Caffeine use: daily           BP (!) 150/84 (BP Location: Left Arm)   Pulse (!) 35   Ht 5\' 9"  (1.753 m)   Wt 201 lb (91.2 kg)   BMI 29.68 kg/m   Physical Exam:  Well appearing middle aged man, NAD HEENT: Unremarkable Neck:  6 cm JVD, no thyromegally Lymphatics:  No adenopathy Back:  No CVA tenderness Lungs:  Clear with no wheezes HEART:   Regular rate rhythm, no murmurs, no rubs, no clicks Abd:  soft, positive bowel sounds, no organomegally, no rebound, no guarding Ext:  2 plus pulses, no edema, no cyanosis, no clubbing Skin:  No rashes no nodules Neuro:  CN II through XII intact, motor grossly intact  EKG - reviewed, nsr with frequent PVC's. RBBB pattern with a superior axis.   Assess/Plan: 1. Atrial fib - he has had no additional atrial fib since his thyroid was controlled. He has no indication for anti-coagulation. 2. HTN - his blood pressure has been under better control but he does have some white coat HTN. He is encouraged to lose weight and reduce his salt intake. 3. Hypothyroidism - he appears euthyroid on synthroid. 4. PVC's - he is asymptomatic. I have asked the patient to wear a cardiac monitor so that we can risk stratify. Additional rec's will depend on result of his monitor.  Jesse Suarez.D.

## 2017-01-12 NOTE — Patient Instructions (Signed)
Medication Instructions:  Your physician recommends that you continue on your current medications as directed. Please refer to the Current Medication list given to you today.   Labwork: none  Testing/Procedures: Your physician has recommended that you wear a holter monitor. Holter monitors are medical devices that record the heart's electrical activity. Doctors most often use these monitors to diagnose arrhythmias. Arrhythmias are problems with the speed or rhythm of the heartbeat. The monitor is a small, portable device. You can wear one while you do your normal daily activities. This is usually used to diagnose what is causing palpitations/syncope (passing out).    Follow-Up: Based on results of monitor  Any Other Special Instructions Will Be Listed Below (If Applicable).     If you need a refill on your cardiac medications before your next appointment, please call your pharmacy.

## 2017-01-13 ENCOUNTER — Other Ambulatory Visit: Payer: Self-pay | Admitting: Physician Assistant

## 2017-01-14 ENCOUNTER — Encounter: Payer: Self-pay | Admitting: Physician Assistant

## 2017-01-14 NOTE — Telephone Encounter (Signed)
LMOVM advising rx is ready for pick up at front desk. Need to sign CSC and give a UDS

## 2017-01-14 NOTE — Telephone Encounter (Signed)
Ok to give one-month supple. Rx refilled.  He will have to pick up, sign CSC and give UDS sample.

## 2017-01-14 NOTE — Telephone Encounter (Signed)
Last filled 12/07/16 #90 No CSC No UDS Last OV: 01/06/17  Please advise

## 2017-01-15 DIAGNOSIS — Z79899 Other long term (current) drug therapy: Secondary | ICD-10-CM | POA: Diagnosis not present

## 2017-01-18 ENCOUNTER — Emergency Department (HOSPITAL_COMMUNITY): Payer: 59

## 2017-01-18 ENCOUNTER — Emergency Department (HOSPITAL_COMMUNITY)
Admission: EM | Admit: 2017-01-18 | Discharge: 2017-01-18 | Disposition: A | Discharge status: Home / Self Care | Payer: 59 | Attending: Physician Assistant | Admitting: Physician Assistant

## 2017-01-18 ENCOUNTER — Encounter (HOSPITAL_COMMUNITY): Payer: Self-pay | Admitting: Emergency Medicine

## 2017-01-18 DIAGNOSIS — Z79899 Other long term (current) drug therapy: Secondary | ICD-10-CM | POA: Diagnosis not present

## 2017-01-18 DIAGNOSIS — I5030 Unspecified diastolic (congestive) heart failure: Secondary | ICD-10-CM | POA: Insufficient documentation

## 2017-01-18 DIAGNOSIS — I110 Hypertensive heart disease with heart failure: Secondary | ICD-10-CM | POA: Insufficient documentation

## 2017-01-18 DIAGNOSIS — E039 Hypothyroidism, unspecified: Secondary | ICD-10-CM | POA: Diagnosis not present

## 2017-01-18 DIAGNOSIS — J208 Acute bronchitis due to other specified organisms: Secondary | ICD-10-CM

## 2017-01-18 DIAGNOSIS — Z7982 Long term (current) use of aspirin: Secondary | ICD-10-CM | POA: Diagnosis not present

## 2017-01-18 DIAGNOSIS — R0602 Shortness of breath: Secondary | ICD-10-CM | POA: Diagnosis not present

## 2017-01-18 DIAGNOSIS — F17210 Nicotine dependence, cigarettes, uncomplicated: Secondary | ICD-10-CM | POA: Diagnosis not present

## 2017-01-18 DIAGNOSIS — R05 Cough: Secondary | ICD-10-CM | POA: Diagnosis present

## 2017-01-18 LAB — CBC
HCT: 45.5 % (ref 39.0–52.0)
Hemoglobin: 14.9 g/dL (ref 13.0–17.0)
MCH: 29.7 pg (ref 26.0–34.0)
MCHC: 32.7 g/dL (ref 30.0–36.0)
MCV: 90.6 fL (ref 78.0–100.0)
Platelets: 231 10*3/uL (ref 150–400)
RBC: 5.02 MIL/uL (ref 4.22–5.81)
RDW: 14.9 % (ref 11.5–15.5)
WBC: 19.8 10*3/uL — ABNORMAL HIGH (ref 4.0–10.5)

## 2017-01-18 LAB — COMPREHENSIVE METABOLIC PANEL
ALT: 20 U/L (ref 17–63)
AST: 24 U/L (ref 15–41)
Albumin: 3.6 g/dL (ref 3.5–5.0)
Alkaline Phosphatase: 69 U/L (ref 38–126)
Anion gap: 9 (ref 5–15)
BUN: 11 mg/dL (ref 6–20)
CO2: 23 mmol/L (ref 22–32)
Calcium: 8.3 mg/dL — ABNORMAL LOW (ref 8.9–10.3)
Chloride: 102 mmol/L (ref 101–111)
Creatinine, Ser: 1.07 mg/dL (ref 0.61–1.24)
GFR calc Af Amer: >60 mL/min (ref >60)
GFR calc non Af Amer: >60 mL/min (ref >60)
Glucose, Bld: 94 mg/dL (ref 65–99)
Potassium: 3.6 mmol/L (ref 3.5–5.1)
Sodium: 134 mmol/L — ABNORMAL LOW (ref 135–145)
Total Bilirubin: 0.6 mg/dL (ref 0.3–1.2)
Total Protein: 6.2 g/dL — ABNORMAL LOW (ref 6.5–8.1)

## 2017-01-18 LAB — I-STAT TROPONIN, ED: Troponin i, poc: 0.01 ng/mL (ref 0.00–0.08)

## 2017-01-18 MED ORDER — AZITHROMYCIN 250 MG PO TABS
500.0000 mg | ORAL_TABLET | Freq: Once | ORAL | Status: AC
  Administered 2017-01-18: 500 mg via ORAL
  Filled 2017-01-18: qty 2

## 2017-01-18 MED ORDER — PREDNISONE 20 MG PO TABS: ORAL_TABLET | ORAL | 0 refills | Status: DC

## 2017-01-18 MED ORDER — AEROCHAMBER PLUS W/MASK MISC
1.0000 | Freq: Once | Status: AC
  Administered 2017-01-18: 1

## 2017-01-18 MED ORDER — ALBUTEROL SULFATE HFA 108 (90 BASE) MCG/ACT IN AERS
2.0000 | INHALATION_SPRAY | Freq: Once | RESPIRATORY_TRACT | Status: AC
  Administered 2017-01-18: 2 via RESPIRATORY_TRACT
  Filled 2017-01-18: qty 6.7

## 2017-01-18 MED ORDER — AZITHROMYCIN 250 MG PO TABS: ORAL_TABLET | ORAL | 0 refills | Status: DC

## 2017-01-18 MED ORDER — PREDNISONE 20 MG PO TABS
60.0000 mg | ORAL_TABLET | Freq: Once | ORAL | Status: AC
  Administered 2017-01-18: 60 mg via ORAL
  Filled 2017-01-18: qty 3

## 2017-01-18 NOTE — Discharge Instructions (Signed)
We will need a follow-up with your primary care provider in the next 2 days. Please return if you've increasing shortness of breath chest pain or other concerns.

## 2017-01-18 NOTE — ED Provider Notes (Signed)
[facility] DEPT Provider Note   CSN: [id number] Arrival date & time: 01/18/17  0505     History   Chief Complaint Chief Complaint  Patient presents with  . Cough    green sputum    HPI Jesse STROHECKER [name] is a 56 y.o. male.  HPI   Patient's 56 year old male presenting with stress of breath. Patient reports that he has shows breath on walking from his car to work today. Patient is a current smoker. Does not yet carry a diagnosis of COPD. However patient's had to have several treatments with steroids in the past.  Patient has noted mild viral symptoms. Of note he's had increasing cough of both green and yellow sputum.  Patient had an abnormal heart rhythm noted on his annual physical and was referred to Dr. Lovena Le. Dr. Lovena Le is planning on doing Holter monitor later this month.  Past Medical History:  Diagnosis Date  . Allergy   . Anxiety   . Atrial fibrillation ([place])   . Bell palsy   . Depression   . Diastolic heart failure   . Hypercholesterolemia   . Hypertension   . Pneumonia   . Thyroid disease     Patient Active Problem List   Diagnosis Date Noted  . Ventricular trigeminy 01/06/2017  . Bacterial sinusitis 12/08/2016  . Bone neoplasm 02/25/2015  . History of atrial fibrillation 10/16/2014  . Nicotine addiction 08/07/2013  . Depression with anxiety 02/20/2012  . ED (erectile dysfunction) 02/20/2012  . Hx of Bell's palsy 02/20/2012  . Amputated toe ([place]) 02/20/2012  . Hypothyroidism following radioiodine therapy 01/15/2011  . Hypertension 01/15/2011  . Dyslipidemia 01/15/2011    Past Surgical History:  Procedure Laterality Date  . CHOLECYSTECTOMY    . HERNIA REPAIR    . KNEE SURGERY    . VASECTOMY         Home Medications    Prior to Admission medications   Medication Sig Start Date End Date Taking? Authorizing Provider  ALPRAZolam [name] 0.5 MG tablet TAKE 1 TABLET BY MOUTH 3 TIMES A DAY 01/14/17  Yes Brunetta Jeans, PA-C  aspirin 81  MG tablet Take 81 mg by mouth every other day.    Yes [provider]  carvedilol (COREG) 12.5 MG tablet Take 1 tablet (12.5 mg total) by mouth 2 (two) times daily. 06/09/16  Yes Darlyne Russian, MD  FLUoxetine (PROZAC) 40 MG capsule Take 2 capsules (80 mg total) by mouth daily. Patient taking differently: Take 80 mg by mouth 2 (two) times daily.  05/22/16  Yes Darlyne Russian, MD  ipratropium (ATROVENT) 0.03 % nasal spray Place 2 sprays into both nostrils 3 (three) times daily. 01/09/17  Yes Brunetta Jeans, PA-C  levocetirizine (XYZAL) 5 MG tablet Take 5 mg by mouth every evening.   Yes [provider]  lisinopril (PRINIVIL,ZESTRIL) 20 MG tablet Take 1 tablet (20 mg total) by mouth daily. 05/22/16  Yes Darlyne Russian, MD  simvastatin (ZOCOR) 40 MG tablet TAKE 1 TABLET BY MOUTH AT BEDTIME. 06/09/16  Yes Daub, Loura Back, MD  SYNTHROID 112 MCG tablet Take 1 tablet (112 mcg total) by mouth daily before breakfast. 05/22/16  Yes Daub, Loura Back, MD  VENTOLIN HFA 108 (90 Base) MCG/ACT inhaler INHALE 2 PUFFS INTO THE LUNGS EVERY 6 HOURS AS NEEDED FOR WHEEZING/SHORTNESS OF BREATH 01/05/17  Yes Brunetta Jeans, PA-C    Family History Family History  Problem Relation Age of Onset  . Heart disease Mother   .  Heart attack Mother   . Other Mother     thyroid problems  . Kidney cancer Mother   . Cirrhosis Father   . Other Father     thyroid problems  . Alcohol abuse Father   . Hypertension Father   . Mental illness Brother   . Parkinsonism Brother     Social History Social History  Substance Use Topics  . Smoking status: Current Every Day Smoker    Packs/day: 1.00    Years: 30.00    Types: Cigarettes  . Smokeless tobacco: Never Used  . Alcohol use 0.0 oz/week     Comment: rarely     Allergies   Sulfa drugs cross reactors and Codeine   Review of Systems Review of Systems  Constitutional: Negative for activity change.  Respiratory: Positive for cough and shortness of breath.  Negative for chest tightness.   Cardiovascular: Negative for chest pain.  Gastrointestinal: Negative for abdominal pain.  All other systems reviewed and are negative.    Physical Exam Updated Vital Signs BP 130/84 (BP Location: Right Arm)   Pulse (!) 32   Temp 97.7 F (36.5 C)   Resp 19   Ht 5\' 9"  (1.753 m)   Wt 203 lb (92.1 kg)   SpO2 96%   BMI 29.98 kg/m   Physical Exam  Constitutional: He appears well-developed and well-nourished.  HENT:  Head: Normocephalic and atraumatic.  Eyes: Conjunctivae are normal.  Neck: Neck supple.  Cardiovascular: Normal rate.   No murmur heard. Pulmonary/Chest: Effort normal. No respiratory distress. He has wheezes.  Abdominal: Soft. There is no tenderness.  Musculoskeletal: He exhibits no edema.  Neurological: He is alert.  Skin: Skin is warm and dry.  Psychiatric: He has a normal mood and affect.  Nursing note and vitals reviewed.    ED Treatments / Results  Labs (all labs ordered are listed, but only abnormal results are displayed) Labs Reviewed  CBC - Abnormal; Notable for the following:       Result Value   WBC 19.8 (*)    All other components within normal limits  COMPREHENSIVE METABOLIC PANEL - Abnormal; Notable for the following:    Sodium 134 (*)    Calcium 8.3 (*)    Total Protein 6.2 (*)    All other components within normal limits  I-STAT TROPOININ, ED    EKG  EKG Interpretation None       Radiology Dg Chest 2 View  Result Date: 01/18/2017 CLINICAL DATA:  Shortness of breath and hypertensive. History of hypertension, smoker. EXAM: CHEST  2 VIEW COMPARISON:  Chest radiograph February 20, 2016 FINDINGS: Cardiomediastinal silhouette is normal. No pleural effusions or focal consolidations. Trachea projects midline and there is no pneumothorax. Persistently elevated RIGHT hemidiaphragm. Similar bronchitic changes with bibasilar atelectasis. Soft tissue planes and included osseous structures are non-suspicious. Surgical  clips in the included right abdomen compatible with cholecystectomy. IMPRESSION: Similar bronchitic changes with bibasilar atelectasis. Electronically Signed   By: Elon Alas M.D.   On: 01/18/2017 05:45    Procedures Procedures (including critical care time)  Medications Ordered in ED Medications  azithromycin (ZITHROMAX) tablet 500 mg (not administered)  predniSONE (DELTASONE) tablet 60 mg (not administered)  albuterol (PROVENTIL HFA;VENTOLIN HFA) 108 (90 Base) MCG/ACT inhaler 2 puff (not administered)  aerochamber plus with mask device 1 each (not administered)     Initial Impression / Assessment and Plan / ED Course  I have reviewed the triage vital signs and the nursing notes.  Pertinent labs & imaging results that were available during my care of the patient were reviewed by me and considered in my medical decision making (see chart for details).     Patient is a 56 year old male presenting with cough in terms of breath. Patient has increased sputum production. Patient also has viral-like symptoms. In the setting of a current smoker, I think this represents bronchitis. Patient given of albuterol treatment on the way in by EMS and patient feels much improved. We'll give an albuterol inhaler for home. We'll treat with azithromycin for his anti-inflammatory properties as well as prednisone.  We'll have him follow up with primary care this week.  Patient also being followed by Dr. Lovena Le for arrhythmia. And today EKG just shows ventricular bigeminy. I rdon't see  any need for acute intervention given he has no chest pain/symptoms.   Final Clinical Impressions(s) / ED Diagnoses   Final diagnoses:  None    New Prescriptions New Prescriptions   No medications on file     Macarthur Critchley, MD 01/18/17 [phone]

## 2017-01-18 NOTE — ED Notes (Signed)
To XRAY at this time with cardiac monitor in place.

## 2017-01-18 NOTE — ED Notes (Signed)
MD at bedside. 

## 2017-01-18 NOTE — ED Notes (Signed)
Patient left at this time with all belongings. 

## 2017-01-18 NOTE — ED Triage Notes (Signed)
Pt arrives via EMS from work where he felt shob and dizzy with ambulation. Went to see nurse at work who called 911 for [facility]. EMS reports diminished LS. Gave 5MG  albuterol. Noted to be in ventricular bigeminy/trigeminy intermittently, existing problem - cardiologist Lovena Le wants Holter monitor. Reports s/s when rhythm changes.

## 2017-01-18 NOTE — ED Notes (Signed)
Teach back completed for aerochamber.

## 2017-02-04 ENCOUNTER — Encounter: Payer: Self-pay | Admitting: *Deleted

## 2017-02-04 ENCOUNTER — Ambulatory Visit (INDEPENDENT_AMBULATORY_CARE_PROVIDER_SITE_OTHER): Payer: 59

## 2017-02-04 DIAGNOSIS — I493 Ventricular premature depolarization: Secondary | ICD-10-CM

## 2017-02-05 ENCOUNTER — Telehealth: Payer: Self-pay | Admitting: Internal Medicine

## 2017-02-05 NOTE — Telephone Encounter (Signed)
Patient calling, states that he was supposed to wear a 24 hour monitor and is supposed to turn monitor in today. Patient states that the "patches" for the heart monitor keep coming off and would like to know what to do.

## 2017-02-07 ENCOUNTER — Other Ambulatory Visit: Payer: Self-pay | Admitting: Emergency Medicine

## 2017-02-07 DIAGNOSIS — I10 Essential (primary) hypertension: Secondary | ICD-10-CM

## 2017-02-10 NOTE — Telephone Encounter (Signed)
Follow up     Pt is calling about monitor results.

## 2017-02-12 NOTE — Telephone Encounter (Signed)
lmtcb

## 2017-02-12 NOTE — Telephone Encounter (Signed)
Called, spoke with pt. Informed results of recent holter monitor. Informed pt needs to schedule pt consult with Dr. Lovena Le. Informed someone will call next week to schedule pt.

## 2017-02-12 NOTE — Telephone Encounter (Signed)
Notes recorded by Evans Lance, MD on 02/11/2017 at 9:21 PM EDT I need to see him back to discuss treatment options. GT

## 2017-02-16 ENCOUNTER — Other Ambulatory Visit: Payer: Self-pay | Admitting: Physician Assistant

## 2017-02-16 NOTE — Telephone Encounter (Signed)
Rx faxed to [facility] pharmacy.  

## 2017-02-16 NOTE — Telephone Encounter (Signed)
Xanax last rx 01/14/17 #90 CSC: 01/14/17 UDS: 01/15/17   Please advise in PCP absence

## 2017-02-26 ENCOUNTER — Encounter: Payer: Self-pay | Admitting: *Deleted

## 2017-02-26 ENCOUNTER — Ambulatory Visit (INDEPENDENT_AMBULATORY_CARE_PROVIDER_SITE_OTHER): Payer: 59 | Admitting: Internal Medicine

## 2017-02-26 ENCOUNTER — Encounter: Payer: Self-pay | Admitting: Internal Medicine

## 2017-02-26 VITALS — BP 130/84 | HR 65 | Ht 69.0 in | Wt 205.4 lb

## 2017-02-26 DIAGNOSIS — Z79899 Other long term (current) drug therapy: Secondary | ICD-10-CM | POA: Diagnosis not present

## 2017-02-26 DIAGNOSIS — I493 Ventricular premature depolarization: Secondary | ICD-10-CM | POA: Diagnosis not present

## 2017-02-26 MED ORDER — FLECAINIDE ACETATE 100 MG PO TABS: 100.0000 mg | ORAL_TABLET | Freq: Two times a day (BID) | ORAL | 3 refills | Status: DC

## 2017-02-26 NOTE — Progress Notes (Signed)
HPI [name] returns today for followup of his PVC's. He is a pleasant 56 yo man with a remote h/o Graves and thyrotoxicosis who developed very difficult to control atrial fib with a RVR. The patient has subsequently developed frequent but mostly asymptomatic PVC's. He has preserved LV function and he had over 20K PVC's in 24 hours on holter monitor with over 90% of the PVC's being of a single QRS morphology. The patient has a RBBB superior axis morphology with both AVR and AVL having an initial negative deflection and then overall positive QRS. He is already on a beta blocker. No syncope and no anginal symptoms and no sob. He is still working.  Allergies  Allergen Reactions  . Sulfa Drugs Cross Reactors Hives  . Codeine Other (See Comments)    Made him very jittery      Current Outpatient Prescriptions  Medication Sig Dispense Refill  . ALPRAZolam (XANAX) 0.5 MG tablet TAKE 1 TABLET BY MOUTH 3 TIMES A DAY 90 tablet 0  . aspirin 81 MG tablet Take 81 mg by mouth every other day.     . carvedilol (COREG) 12.5 MG tablet Take 1 tablet (12.5 mg total) by mouth 2 (two) times daily. 180 tablet 3  . FLUoxetine (PROZAC) 40 MG capsule Take 2 capsules (80 mg total) by mouth daily. (Patient taking differently: Take 80 mg by mouth 2 (two) times daily. ) 180 capsule 3  . ipratropium (ATROVENT) 0.03 % nasal spray Place 2 sprays into both nostrils 3 (three) times daily. 30 mL 1  . levocetirizine (XYZAL) 5 MG tablet Take 5 mg by mouth every evening.    Marland Kitchen lisinopril (PRINIVIL,ZESTRIL) 20 MG tablet TAKE 1 TABLET DAILY 30 tablet 0  . simvastatin (ZOCOR) 40 MG tablet TAKE 1 TABLET BY MOUTH AT BEDTIME. 90 tablet 3  . SYNTHROID 112 MCG tablet Take 1 tablet (112 mcg total) by mouth daily before breakfast. 30 tablet 11  . VENTOLIN HFA 108 (90 Base) MCG/ACT inhaler INHALE 2 PUFFS INTO THE LUNGS EVERY 6 HOURS AS NEEDED FOR WHEEZING/SHORTNESS OF BREATH 18 Inhaler 5   No current facility-administered  medications for this visit.      Past Medical History:  Diagnosis Date  . Allergy   . Anxiety   . Atrial fibrillation ([place])   . Bell palsy   . Depression   . Diastolic heart failure   . Hypercholesterolemia   . Hypertension   . Pneumonia   . Thyroid disease     ROS:   All systems reviewed and negative except as noted in the HPI.   Past Surgical History:  Procedure Laterality Date  . CHOLECYSTECTOMY    . HERNIA REPAIR    . KNEE SURGERY    . VASECTOMY       Family History  Problem Relation Age of Onset  . Heart disease Mother   . Heart attack Mother   . Other Mother        thyroid problems  . Kidney cancer Mother   . Cirrhosis Father   . Other Father        thyroid problems  . Alcohol abuse Father   . Hypertension Father   . Mental illness Brother   . Parkinsonism Brother      Social History   Social History  . Marital status: Married    Spouse name: N/A  . Number of children: 1  . Years of education: N/A   Occupational History  .  Not on file.   Social History Main Topics  . Smoking status: Current Every Day Smoker    Packs/day: 1.00    Years: 30.00    Types: Cigarettes  . Smokeless tobacco: Never Used  . Alcohol use 0.0 oz/week     Comment: rarely  . Drug use: No  . Sexual activity: No   Other Topics Concern  . 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      Tobacco: 1 ppd x 30 years      Alcohol: rare     Regular exercise: walking daily   Caffeine use: daily           BP 130/84   Pulse 65   Ht 5\' 9"  (1.753 m)   Wt 205 lb 6.4 oz (93.2 kg)   SpO2 96%   BMI 30.33 kg/m   Physical Exam:  Well appearing 56 yo man, NAD HEENT: Unremarkable Neck:  6 cm JVD, no thyromegally Lymphatics:  No adenopathy Back:  No CVA tenderness Lungs:  Clear with no  wheezes HEART:  Iregular rate rhythm, no murmurs, no rubs, no clicks Abd:  soft, positive bowel sounds, no organomegally, no rebound, no guarding Ext:  2 plus pulses, no edema, no cyanosis, no clubbing Skin:  No rashes no nodules Neuro:  CN II through XII intact, motor grossly intact  EKG - none today  ECHO - reviewed, normal LV function 24 hour holter - reviewed. As above  Assess/Plan: 1. PVC's - his significant PVC burden is worrisome and I am concerned about the development of a tachy induced CM. I have discussed the treatment options with the patient in detail. Either catheter ablation or initiation of AA drug therapy were offered. The pro's and con's of each were reviewed. He will start flecainide 100 mg twice daily and we will have him undergo exercise testing in 2 weeks to rule out pro-arrhythmias. If he tolerates the flecainide and his PVC burden goes down under 10K then we will continue the flecainide. If the flecainide does not accomplish the above or if he is intolerant then catheter ablation would be recommended 2. HTN - his blood pressure is well controlled. No change. 3. Thyrotoxicosis - this is stable currently. He appears euthyroid 4. Atrial fib - his atrial fib has resolved since his thyroid became controlled.   Mikle Bosworth.D.

## 2017-02-26 NOTE — Patient Instructions (Signed)
Medication Instructions:    Your physician has recommended you make the following change in your medication:  1) START Flecainide 100 mg twice daily - start this medication 7-10 days prior to stress testing.  - If you need a refill on your cardiac medications before your next appointment, please call your pharmacy.   Labwork:  None ordered  Testing/Procedures: Your physician has requested that you have an exercise tolerance test. For further information please visit [email or web address]. Please also follow instruction sheet, as given.  You will start Flecainide 7-10 days prior to this test.  Your physician has recommended that you wear a holter monitor in 2 months. Holter monitors are medical devices that record the heart's electrical activity. Doctors most often use these monitors to diagnose arrhythmias. Arrhythmias are problems with the speed or rhythm of the heartbeat. The monitor is a small, portable device. You can wear one while you do your normal daily activities. This is usually used to diagnose what is causing palpitations/syncope (passing out).  Follow-Up:  Your physician recommends that you schedule a follow-up appointment in: 3 months with Dr. Lovena Le.  Thank you for choosing [facility]!!     Any Other Special Instructions Will Be Listed Below (If Applicable).   Flecainide tablets What is this medicine? FLECAINIDE (FLEK a nide) is an antiarrhythmic drug. This medicine is used to prevent irregular heart rhythm. It can also slow down fast heartbeats called tachycardia. This medicine may be used for other purposes; ask your health care provider or pharmacist if you have questions. COMMON BRAND NAME(S): Tambocor What should I tell my health care provider before I take this medicine? They need to know if you have any of these conditions: -abnormal levels of potassium in the blood -heart disease including heart rhythm and heart rate problems -kidney or liver  disease -recent heart attack -an unusual or allergic reaction to flecainide, local anesthetics, other medicines, foods, dyes, or preservatives -pregnant or trying to get pregnant -breast-feeding How should I use this medicine? Take this medicine by mouth with a glass of water. Follow the directions on the prescription label. You can take this medicine with or without food. Take your doses at regular intervals. Do not take your medicine more often than directed. Do not stop taking this medicine suddenly. This may cause serious, heart-related side effects. If your doctor wants you to stop the medicine, the dose may be slowly lowered over time to avoid any side effects. Talk to your pediatrician regarding the use of this medicine in children. While this drug may be prescribed for children as young as 1 year of age for selected conditions, precautions do apply. Overdosage: If you think you have taken too much of this medicine contact a poison control center or emergency room at once. NOTE: This medicine is only for you. Do not share this medicine with others. What if I miss a dose? If you miss a dose, take it as soon as you can. If it is almost time for your next dose, take only that dose. Do not take double or extra doses. What may interact with this medicine? Do not take this medicine with any of the following medications: -amoxapine -arsenic trioxide -certain antibiotics like clarithromycin, erythromycin, gatifloxacin, gemifloxacin, levofloxacin, moxifloxacin, sparfloxacin, or troleandomycin -certain antidepressants called tricyclic antidepressants like amitriptyline, imipramine, or nortriptyline -certain medicines to control heart rhythm like disopyramide, dofetilide, encainide, moricizine, procainamide, propafenone, and quinidine -cisapride -cyclobenzaprine -delavirdine -droperidol -haloperidol -hawthorn -imatinib -levomethadyl -maprotiline -medicines for malaria like chloroquine and  halofantrine -pentamidine -phenothiazines like chlorpromazine, mesoridazine, prochlorperazine, thioridazine -pimozide -quinine -ranolazine -ritonavir -sertindole -ziprasidone This medicine may also interact with the following medications: -cimetidine -medicines for angina or high blood pressure -medicines to control heart rhythm like amiodarone and digoxin This list may not describe all possible interactions. Give your health care provider a list of all the medicines, herbs, non-prescription drugs, or dietary supplements you use. Also tell them if you smoke, drink alcohol, or use illegal drugs. Some items may interact with your medicine. What should I watch for while using this medicine? Visit your doctor or health care professional for regular checks on your progress. Because your condition and the use of this medicine carries some risk, it is a good idea to carry an identification card, necklace or bracelet with details of your condition, medications and doctor or health care professional. Check your blood pressure and pulse rate regularly. Ask your health care professional what your blood pressure and pulse rate should be, and when you should contact him or her. Your doctor or health care professional also may schedule regular blood tests and electrocardiograms to check your progress. You may get drowsy or dizzy. Do not drive, use machinery, or do anything that needs mental alertness until you know how this medicine affects you. Do not stand or sit up quickly, especially if you are an older patient. This reduces the risk of dizzy or fainting spells. Alcohol can make you more dizzy, increase flushing and rapid heartbeats. Avoid alcoholic drinks. What side effects may I notice from receiving this medicine? Side effects that you should report to your doctor or health care professional as soon as possible: -chest pain, continued irregular heartbeats -difficulty breathing -swelling of the legs or  feet -trembling, shaking -unusually weak or tired Side effects that usually do not require medical attention (report to your doctor or health care professional if they continue or are bothersome): -blurred vision -constipation -headache -nausea, vomiting -stomach pain This list may not describe all possible side effects. Call your doctor for medical advice about side effects. You may report side effects to FDA at 1-800-FDA-1088. Where should I keep my medicine? Keep out of the reach of children. Store at room temperature between 15 and 30 degrees C (59 and 86 degrees F). Protect from light. Keep container tightly closed. Throw away any unused medicine after the expiration date. NOTE: This sheet is a summary. It may not cover all possible information. If you have questions about this medicine, talk to your doctor, pharmacist, or health care provider.  2018 Elsevier/Gold Standard (2008-01-04 16:46:09)

## 2017-03-05 ENCOUNTER — Other Ambulatory Visit: Payer: Self-pay | Admitting: Physician Assistant

## 2017-03-09 ENCOUNTER — Other Ambulatory Visit: Payer: Self-pay | Admitting: Physician Assistant

## 2017-03-09 DIAGNOSIS — I10 Essential (primary) hypertension: Secondary | ICD-10-CM

## 2017-03-11 ENCOUNTER — Other Ambulatory Visit: Payer: Self-pay | Admitting: Physician Assistant

## 2017-03-11 DIAGNOSIS — I10 Essential (primary) hypertension: Secondary | ICD-10-CM

## 2017-03-12 ENCOUNTER — Other Ambulatory Visit: Payer: Self-pay | Admitting: Emergency Medicine

## 2017-03-12 ENCOUNTER — Ambulatory Visit: Payer: 59

## 2017-03-12 DIAGNOSIS — I10 Essential (primary) hypertension: Secondary | ICD-10-CM

## 2017-03-12 MED ORDER — CARVEDILOL 12.5 MG PO TABS: 12.5000 mg | ORAL_TABLET | Freq: Two times a day (BID) | ORAL | 1 refills | Status: DC

## 2017-03-13 ENCOUNTER — Telehealth: Payer: Self-pay | Admitting: Nurse Practitioner

## 2017-03-13 NOTE — Telephone Encounter (Signed)
   BP's higher - 160's to 170's since ETT yesterday.  He took his meds late yesterday and late again today.  Now though, BP is 140.  I rec that if BP elevates even after taking meds, he may take an additional 1/2 of a coreg but since BP appears to be coming down now, there is no need to do that now.  Caller verbalized understanding and was grateful for the call back.  Murray Hodgkins, NP 03/13/2017, 3:33 PM

## 2017-03-15 ENCOUNTER — Other Ambulatory Visit: Payer: Self-pay | Admitting: Family Medicine

## 2017-03-15 NOTE — Telephone Encounter (Signed)
Last OV 01/06/17 Alprazolam last filled 02/16/17 #90 with 0  CSC, UDS

## 2017-03-16 ENCOUNTER — Ambulatory Visit: Payer: 59

## 2017-03-23 ENCOUNTER — Encounter (INDEPENDENT_AMBULATORY_CARE_PROVIDER_SITE_OTHER): Payer: Self-pay

## 2017-03-23 ENCOUNTER — Ambulatory Visit (INDEPENDENT_AMBULATORY_CARE_PROVIDER_SITE_OTHER): Payer: 59 | Admitting: Internal Medicine

## 2017-03-23 ENCOUNTER — Encounter: Payer: Self-pay | Admitting: Internal Medicine

## 2017-03-23 VITALS — BP 162/98 | HR 60 | Ht 69.0 in | Wt 207.8 lb

## 2017-03-23 DIAGNOSIS — I10 Essential (primary) hypertension: Secondary | ICD-10-CM | POA: Diagnosis not present

## 2017-03-23 DIAGNOSIS — I4891 Unspecified atrial fibrillation: Secondary | ICD-10-CM | POA: Diagnosis not present

## 2017-03-23 MED ORDER — CARVEDILOL 12.5 MG PO TABS: 18.7500 mg | ORAL_TABLET | Freq: Two times a day (BID) | ORAL | 3 refills | Status: DC

## 2017-03-23 NOTE — Patient Instructions (Addendum)
Medication Instructions:  Your physician has recommended you make the following change in your medication: increase your carvedilol to 1.5 tablets (18.75 mg) by mouth twice a day.    Labwork: TSH and T4 today.   Testing/Procedures: None ordered.   Follow-Up: Your physician wants you to follow-up in: the week after Labor day first available for Dr. Lovena Le.   You will receive a reminder letter in the mail two months in advance. If you don't receive a letter, please call our office to schedule the follow-up appointment.   Any Other Special Instructions Will Be Listed Below (If Applicable).     If you need a refill on your cardiac medications before your next appointment, please call your pharmacy.

## 2017-03-23 NOTE — Progress Notes (Addendum)
HPI Jesse Suarez returns today for followup of his PVC's. He is a pleasant 56 yo man with a remote h/o Graves and thyrotoxicosis who developed very difficult to control atrial fib with a RVR. The patient has subsequently developed frequent but mostly asymptomatic PVC's. He has preserved LV function and he had over 20K PVC's in 24 hours on holter monitor with over 90% of the PVC's being of a single QRS morphology. The patient has a RBBB superior axis morphology with both AVR and AVL having an initial negative deflection and then overall positive QRS. When I saw him last, he was placed on flecainide to suppress his PVC's. He came back for a treadmill stress test and his BP was too high. He came back again and he was in atrial fib and did not know it. He has not had any more PVC's based on the ECGs around his treadmill. He did not feel his atrial fib.  Allergies  Allergen Reactions  . Sulfa Drugs Cross Reactors Hives  . Codeine Other (See Comments)    Made him very jittery      Current Outpatient Prescriptions  Medication Sig Dispense Refill  . ALPRAZolam (XANAX) 0.5 MG tablet TAKE 1 TABLET BY MOUTH 3 TIMES A DAY 90 tablet 0  . aspirin 81 MG tablet Take 81 mg by mouth every other day.     . carvedilol (COREG) 12.5 MG tablet Take 1.5 tablets (18.75 mg total) by mouth 2 (two) times daily. 270 tablet 3  . flecainide (TAMBOCOR) 100 MG tablet Take 1 tablet (100 mg total) by mouth 2 (two) times daily. 60 tablet 3  . FLUoxetine (PROZAC) 40 MG capsule Take 2 capsules (80 mg total) by mouth daily. 180 capsule 3  . ipratropium (ATROVENT) 0.03 % nasal spray USE 2 SPRAYS INTO BOTH NOSTRIL 3 TIMES DAILY 30 mL 1  . levocetirizine (XYZAL) 5 MG tablet Take 5 mg by mouth every evening.    Marland Kitchen lisinopril (PRINIVIL,ZESTRIL) 20 MG tablet TAKE 1 TABLET BY MOUTH EVERY DAY 30 tablet 5  . simvastatin (ZOCOR) 40 MG tablet TAKE 1 TABLET BY MOUTH AT BEDTIME. 90 tablet 3  . SYNTHROID 112 MCG tablet Take 1 tablet (112  mcg total) by mouth daily before breakfast. 30 tablet 11  . VENTOLIN HFA 108 (90 Base) MCG/ACT inhaler INHALE 2 PUFFS INTO THE LUNGS EVERY 6 HOURS AS NEEDED FOR WHEEZING/SHORTNESS OF BREATH 18 Inhaler 5   No current facility-administered medications for this visit.      Past Medical History:  Diagnosis Date  . Allergy   . Anxiety   . Atrial fibrillation ([place])   . Bell palsy   . Depression   . Diastolic heart failure   . Hypercholesterolemia   . Hypertension   . Pneumonia   . Thyroid disease     ROS:   All systems reviewed and negative except as noted in the HPI.   Past Surgical History:  Procedure Laterality Date  . CHOLECYSTECTOMY    . HERNIA REPAIR    . KNEE SURGERY    . VASECTOMY       Family History  Problem Relation Age of Onset  . Heart disease Mother   . Heart attack Mother   . Other Mother        thyroid problems  . Kidney cancer Mother   . Cirrhosis Father   . Other Father        thyroid problems  . Alcohol abuse Father   .  Hypertension Father   . Mental illness Brother   . Parkinsonism Brother      Social History   Social History  . Marital status: Married    Spouse name: N/A  . Number of children: 1  . Years of education: N/A   Occupational History  . Not on file.   Social History Main Topics  . Smoking status: Current Every Day Smoker    Packs/day: 1.00    Years: 30.00    Types: Cigarettes  . Smokeless tobacco: Never Used  . Alcohol use 0.0 oz/week     Comment: rarely  . Drug use: No  . Sexual activity: No   Other Topics Concern  . 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      Tobacco: 1 ppd x 30 years      Alcohol: rare     Regular exercise: walking daily   Caffeine use: daily           BP (!) 162/98   Pulse 60   Ht 5\' 9"  (1.753 m)    Wt 207 lb 12.8 oz (94.3 kg)   SpO2 97%   BMI 30.69 kg/m   Physical Exam:  Well appearing 56 yo man, NAD HEENT: Unremarkable Neck:  6 cm JVD, no thyromegally Lymphatics:  No adenopathy Back:  No CVA tenderness Lungs:  Clear with no wheezes HEART:  Regular rate rhythm, no murmurs, no rubs, no clicks Abd:  soft, positive bowel sounds, no organomegally, no rebound, no guarding Ext:  2 plus pulses, no edema, no cyanosis, no clubbing Skin:  No rashes no nodules Neuro:  CN II through XII intact, motor grossly intact  EKG - NSR  ECHO - reviewed, normal LV function 24 hour holter - reviewed. As above  Assess/Plan: 1. PVC's - his PVC's appear to be much better on flecainide. He will continue this medication for now. 2. HTN - his blood pressure is not well controlled. I have asked him to increase his coreg. 3. Thyrotoxicosis - this is stable currently. He appears euthyroid. Will check a TSH. 4. Atrial fib - his atrial fib has been quiet but it is clear that he does not feel his atrial fib. He will continue flecainide. His stroke risk is low so I will not rec he start an Delta.   Jesse Suarez.D.

## 2017-03-24 ENCOUNTER — Telehealth: Payer: Self-pay

## 2017-03-24 ENCOUNTER — Telehealth: Payer: Self-pay | Admitting: Physician Assistant

## 2017-03-24 LAB — TSH: TSH: 9.04 u[IU]/mL — ABNORMAL HIGH (ref 0.450–4.500)

## 2017-03-24 LAB — T4, FREE: Free T4: 1.13 ng/dL (ref 0.82–1.77)

## 2017-03-24 NOTE — Telephone Encounter (Signed)
Called, left message for Jesse Suarez. Jesse Suarez has questions r/t office visit with Dr. Lovena Le on 03/23/17.

## 2017-03-24 NOTE — Telephone Encounter (Signed)
Was reviewing chart from yesterday's visit with Dr. Lovena Le. TSH is significantly elevated. Has he been taking his thryoid medication every day as directed without fail. If not, he needs to restart daily and repeat TSH 4 weeks. If he has been taking as directed we will need to alter his dose of medication. Let me know and I will send in medication. Either way needs repeat TSH 4-6 weeks.

## 2017-03-25 ENCOUNTER — Telehealth: Payer: Self-pay

## 2017-03-25 ENCOUNTER — Other Ambulatory Visit: Payer: Self-pay

## 2017-03-25 ENCOUNTER — Telehealth: Payer: Self-pay | Admitting: Endocrinology

## 2017-03-25 MED ORDER — LEVOTHYROXINE SODIUM 125 MCG PO CAPS: 1.0000 | ORAL_CAPSULE | Freq: Every day | ORAL | 0 refills | Status: DC

## 2017-03-25 NOTE — Telephone Encounter (Signed)
Please advise 

## 2017-03-25 NOTE — Telephone Encounter (Signed)
Call made to Pt.  Pt states he had received a call from [place] but he wasn't sure what it referred to.  Notified Pt that his TSH had come back elevated and his medication needs to be adjusted.  Asked Pt if he had been taking his current dose of Synthroid daily.  Pt states he has.  Notified Pt that it appeared his PCP had seen results and was working on his Synthroid dose change and follow up needs.  Per Pt, he preferred for his endocrinologist to be made aware of results and make his medication changes.  Per Pt, endo is [name] with [facility].  Call placed to [facility] office and left message to notify Dr. Dwyane Dee of results. Call placed to [facility] and notified Dr. Earlie Server nurse that Pt wished to have his synthroid adjusted by Dr. Dwyane Dee.  Nurse indicates understanding, will alert Dr. Hassell Done.  This nurse will follow to ensure Pt medication is changed and appropriate follow up scheduled.

## 2017-03-25 NOTE — Telephone Encounter (Signed)
His thyroid level is low and will need to change his prescription from 112 to the 125 g levothyroxine once daily.  However will need to have him come back in 6 weeks with labs

## 2017-03-25 NOTE — Telephone Encounter (Signed)
Patient states that Renae left a message on his answer machine yesterday for him to call back about his lab results from his OV on 7/10. These results do not have recommendations from Dr. Lovena Le yet. TSH was elevated at 9.040. There is a note in Epic from patient's PCP as follows:  Delorse Limber   03/24/17 12:58 PM  Note    Was reviewing chart from yesterday's visit with Dr. Lovena Le. TSH is significantly elevated. Has he been taking his thryoid medication every day as directed without fail. If not, he needs to restart daily and repeat TSH 4 weeks. If he has been taking as directed we will need to alter his dose of medication. Let me know and I will send in medication. Either way needs repeat TSH 4-6 weeks.       Patient states that he has been taking his thyroid medication. Advised for patient to reach out to PCP regarding dosing changes. Patient verbalized understanding and thanked me for the call. Will route to Dr. Lovena Le to make him aware.

## 2017-03-25 NOTE — Telephone Encounter (Signed)
Called patient and let him know that I have sent over his new prescription.  Colletta Maryland, please call and schedule patient in 6 weeks for lab and follow up. Thank you!

## 2017-03-25 NOTE — Telephone Encounter (Signed)
Patient called to check the status of the note below. I advised the patient that Dr. Dwyane Dee has not seen the note yet however, [facility] did call on his behalf this morning. Patient would like a detailed message on his phone if he does not answer due to being at work and would like to hear back soon since his medication needs to change.

## 2017-03-25 NOTE — Telephone Encounter (Signed)
FYISonia Baller a nurse at [facility] contacted patient about his abnormal TSH level. Patient admits he is currently taking the Synthroid 112 mcg daily. He is followed by Endocrinology-Dr Dwyane Dee. Patient wants Dr Dwyane Dee to adjust his thyroid medication. Sonia Baller did contact Dr Dwyane Dee office about abnormal lab and they will contact patient to adjust medication.

## 2017-03-25 NOTE — Telephone Encounter (Signed)
Follow up    Please call before 850a he is on his way to work , he is following up with Renae

## 2017-03-25 NOTE — Telephone Encounter (Signed)
[facility] called to advise that Dr. Dwyane Dee should do a retest on the patient's TSH and T4 and also may have to make some changes to the patient's medications. The patient needs a call on the cell number in chart.

## 2017-03-25 NOTE — Telephone Encounter (Signed)
Per review of nurse message, Pt requesting a call to daughter in law to explain test results.  Left message.  This nurse name and # left for call back.

## 2017-03-29 NOTE — Telephone Encounter (Signed)
Please schedule follow-up in 6 weeks with labs

## 2017-04-07 NOTE — Progress Notes (Signed)
done

## 2017-04-15 ENCOUNTER — Other Ambulatory Visit: Payer: Self-pay | Admitting: Physician Assistant

## 2017-04-15 ENCOUNTER — Other Ambulatory Visit: Payer: Self-pay | Admitting: Endocrinology

## 2017-04-16 NOTE — Telephone Encounter (Signed)
Last OV 01/06/17 Alprazolam last filled 03/15/17 #90 with 0  CSC, low risk

## 2017-04-16 NOTE — Telephone Encounter (Signed)
Medication filled to pharmacy as requested.   

## 2017-04-18 ENCOUNTER — Emergency Department (HOSPITAL_BASED_OUTPATIENT_CLINIC_OR_DEPARTMENT_OTHER)
Admission: EM | Admit: 2017-04-18 | Discharge: 2017-04-18 | Disposition: A | Discharge status: Home / Self Care | Payer: 59 | Attending: Emergency Medicine | Admitting: Emergency Medicine

## 2017-04-18 ENCOUNTER — Encounter (HOSPITAL_BASED_OUTPATIENT_CLINIC_OR_DEPARTMENT_OTHER): Payer: Self-pay | Admitting: Adult Health

## 2017-04-18 DIAGNOSIS — Z5321 Procedure and treatment not carried out due to patient leaving prior to being seen by health care provider: Secondary | ICD-10-CM | POA: Insufficient documentation

## 2017-04-18 DIAGNOSIS — H9201 Otalgia, right ear: Secondary | ICD-10-CM | POA: Insufficient documentation

## 2017-04-18 NOTE — ED Triage Notes (Signed)
PResents with bilateral ear cerumen impaction that began today pt states it is making his right ear hurt.

## 2017-04-19 ENCOUNTER — Ambulatory Visit (INDEPENDENT_AMBULATORY_CARE_PROVIDER_SITE_OTHER): Payer: 59 | Admitting: Urgent Care

## 2017-04-19 ENCOUNTER — Encounter: Payer: Self-pay | Admitting: Urgent Care

## 2017-04-19 VITALS — BP 158/94 | HR 63 | Temp 98.7°F | Resp 16 | Ht 69.0 in | Wt 208.0 lb

## 2017-04-19 DIAGNOSIS — H6123 Impacted cerumen, bilateral: Secondary | ICD-10-CM | POA: Diagnosis not present

## 2017-04-19 DIAGNOSIS — R030 Elevated blood-pressure reading, without diagnosis of hypertension: Secondary | ICD-10-CM

## 2017-04-19 DIAGNOSIS — I10 Essential (primary) hypertension: Secondary | ICD-10-CM | POA: Diagnosis not present

## 2017-04-19 NOTE — Progress Notes (Signed)
  MRN: [id number] DOB: 1960/09/17  Subjective:   Jesse Europe [name] is a 56 y.o. male presenting for chief complaint of Ear Pain (right ear impacted and left ear hurts and is impacted)  Reports 1 week history of bilateral ear pain. Has had issues with cerumen impaction, has tried to use Debrox with minimal relief. Denies fever, sinus congestion, sinus pain, sore throat, cough. Patient sees PA-Martin and Dr. Lovena Le for management of his HTN. He has a visit set for f/u at the end of August 2018. Today, denies headaches, confusion, dizziness, chest pain, palpitations, shob, dyspnea, n/v, abdominal pain, hematuria, weakness. Smokes 1ppd.  Jesse Suarez has a current medication list which includes the following prescription(s): alprazolam, aspirin, carvedilol, flecainide, fluoxetine, ipratropium, levocetirizine, lisinopril, simvastatin, synthroid, and ventolin hfa. Also is allergic to sulfa drugs cross reactors and codeine.  Jesse Suarez  has a past medical history of Allergy; Anxiety; Atrial fibrillation ([place]); Bell palsy; Depression; Diastolic heart failure; Hypercholesterolemia; Hypertension; Pneumonia; and Thyroid disease. Also  has a past surgical history that includes Cholecystectomy; Knee surgery; Hernia repair; and Vasectomy.  Objective:   Vitals: BP (!) 158/94 (BP Location: Right Arm, Patient Position: Sitting, Cuff Size: Large)   Pulse 63   Temp 98.7 F (37.1 C) (Oral)   Resp 16   Ht 5\' 9"  (1.753 m)   Wt 208 lb (94.3 kg)   SpO2 97%   BMI 30.72 kg/m   BP Readings from Last 3 Encounters:  04/19/17 (!) 158/94  04/18/17 (!) 161/94  03/23/17 (!) 162/98   Physical Exam  Constitutional: He is oriented to person, place, and time. He appears well-developed and well-nourished.  HENT:  TM's cerumen impacted bilaterally. No tragus tenderness.  Eyes: Right eye exhibits no discharge. Left eye exhibits no discharge.  Neck: Normal range of motion. Neck supple.  Cardiovascular: Normal rate, regular rhythm  and intact distal pulses.  Exam reveals no gallop and no friction rub.   No murmur heard. Pulmonary/Chest: No respiratory distress. He has no wheezes. He has no rales.  Lymphadenopathy:    He has no cervical adenopathy.  Neurological: He is alert and oriented to person, place, and time.  Skin: Skin is warm and dry.  Psychiatric: He has a normal mood and affect.   Assessment and Plan :   1. Bilateral impacted cerumen - Ear lavage performed today.   2. Essential hypertension 3. Elevated blood pressure reading - Patient would like to continue management his BP with his PCP. Counseled on risks of uncontrolled HTN. Return-to-clinic precautions discussed, patient verbalized understanding.   Jaynee Eagles, PA-C [facility] [phone] 04/19/2017  4:43 PM

## 2017-04-19 NOTE — Patient Instructions (Addendum)
Earwax Buildup, Adult The ears produce a substance called earwax that helps keep bacteria out of the ear and protects the skin in the ear canal. Occasionally, earwax can build up in the ear and cause discomfort or hearing loss. What increases the risk? This condition is more likely to develop in people who:  Are male.  Are elderly.  Naturally produce more earwax.  Clean their ears often with cotton swabs.  Use earplugs often.  Use in-ear headphones often.  Wear hearing aids.  Have narrow ear canals.  Have earwax that is overly thick or sticky.  Have eczema.  Are dehydrated.  Have excess hair in the ear canal.  What are the signs or symptoms? Symptoms of this condition include:  Reduced or muffled hearing.  A feeling of fullness in the ear or feeling that the ear is plugged.  Fluid coming from the ear.  Ear pain.  Ear itch.  Ringing in the ear.  Coughing.  An obvious piece of earwax that can be seen inside the ear canal.  How is this diagnosed? This condition may be diagnosed based on:  Your symptoms.  Your medical history.  An ear exam. During the exam, your health care provider will look into your ear with an instrument called an otoscope.  You may have tests, including a hearing test. How is this treated? This condition may be treated by:  Using ear drops to soften the earwax.  Having the earwax removed by a health care provider. The health care provider may: ? Flush the ear with water. ? Use an instrument that has a loop on the end (curette). ? Use a suction device.  Surgery to remove the wax buildup. This may be done in severe cases.  Follow these instructions at home:  Take over-the-counter and prescription medicines only as told by your health care provider.  Do not put any objects, including cotton swabs, into your ear. You can clean the opening of your ear canal with a washcloth or facial tissue.  Follow instructions from your health  care provider about cleaning your ears. Do not over-clean your ears.  Drink enough fluid to keep your urine clear or pale yellow. This will help to thin the earwax.  Keep all follow-up visits as told by your health care provider. If earwax builds up in your ears often or if you use hearing aids, consider seeing your health care provider for routine, preventive ear cleanings. Ask your health care provider how often you should schedule your cleanings.  If you have hearing aids, clean them according to instructions from the manufacturer and your health care provider. Contact a health care provider if:  You have ear pain.  You develop a fever.  You have blood, pus, or other fluid coming from your ear.  You have hearing loss.  You have ringing in your ears that does not go away.  Your symptoms do not improve with treatment.  You feel like the room is spinning (vertigo). Summary  Earwax can build up in the ear and cause discomfort or hearing loss.  The most common symptoms of this condition include reduced or muffled hearing and a feeling of fullness in the ear or feeling that the ear is plugged.  This condition may be diagnosed based on your symptoms, your medical history, and an ear exam.  This condition may be treated by using ear drops to soften the earwax or by having the earwax removed by a health care provider.  Do   not put any objects, including cotton swabs, into your ear. You can clean the opening of your ear canal with a washcloth or facial tissue. This information is not intended to replace advice given to you by your health care provider. Make sure you discuss any questions you have with your health care provider. Document Released: 10/08/2004 Document Revised: 11/11/2016 Document Reviewed: 11/11/2016 Elsevier Interactive Patient Education  2018 [employer].     Hypertension Hypertension, commonly called high blood pressure, is when the force of blood pumping through  the arteries is too strong. The arteries are the blood vessels that carry blood from the heart throughout the body. Hypertension forces the heart to work harder to pump blood and may cause arteries to become narrow or stiff. Having untreated or uncontrolled hypertension can cause heart attacks, strokes, kidney disease, and other problems. A blood pressure reading consists of a higher number over a lower number. Ideally, your blood pressure should be below 120/80. The first ("top") number is called the systolic pressure. It is a measure of the pressure in your arteries as your heart beats. The second ("bottom") number is called the diastolic pressure. It is a measure of the pressure in your arteries as the heart relaxes. What are the causes? The cause of this condition is not known. What increases the risk? Some risk factors for high blood pressure are under your control. Others are not. Factors you can change  Smoking.  Having type 2 diabetes mellitus, high cholesterol, or both.  Not getting enough exercise or physical activity.  Being overweight.  Having too much fat, sugar, calories, or salt (sodium) in your diet.  Drinking too much alcohol. Factors that are difficult or impossible to change  Having chronic kidney disease.  Having a family history of high blood pressure.  Age. Risk increases with age.  Race. You may be at higher risk if you are African-American.  Gender. Men are at higher risk than women before age 68. After age 55, women are at higher risk than men.  Having obstructive sleep apnea.  Stress. What are the signs or symptoms? Extremely high blood pressure (hypertensive crisis) may cause:  Headache.  Anxiety.  Shortness of breath.  Nosebleed.  Nausea and vomiting.  Severe chest pain.  Jerky movements you cannot control (seizures).  How is this diagnosed? This condition is diagnosed by measuring your blood pressure while you are seated, with your arm  resting on a surface. The cuff of the blood pressure monitor will be placed directly against the skin of your upper arm at the level of your heart. It should be measured at least twice using the same arm. Certain conditions can cause a difference in blood pressure between your right and left arms. Certain factors can cause blood pressure readings to be lower or higher than normal (elevated) for a short period of time:  When your blood pressure is higher when you are in a health care provider's office than when you are at home, this is called white coat hypertension. Most people with this condition do not need medicines.  When your blood pressure is higher at home than when you are in a health care provider's office, this is called masked hypertension. Most people with this condition may need medicines to control blood pressure.  If you have a high blood pressure reading during one visit or you have normal blood pressure with other risk factors:  You may be asked to return on a different day to have  your blood pressure checked again.  You may be asked to monitor your blood pressure at home for 1 week or longer.  If you are diagnosed with hypertension, you may have other blood or imaging tests to help your health care provider understand your overall risk for other conditions. How is this treated? This condition is treated by making healthy lifestyle changes, such as eating healthy foods, exercising more, and reducing your alcohol intake. Your health care provider may prescribe medicine if lifestyle changes are not enough to get your blood pressure under control, and if:  Your systolic blood pressure is above 130.  Your diastolic blood pressure is above 80.  Your personal target blood pressure may vary depending on your medical conditions, your age, and other factors. Follow these instructions at home: Eating and drinking  Eat a diet that is high in fiber and potassium, and low in sodium,  added sugar, and fat. An example eating plan is called the DASH (Dietary Approaches to Stop Hypertension) diet. To eat this way: ? Eat plenty of fresh fruits and vegetables. Try to fill half of your plate at each meal with fruits and vegetables. ? Eat whole grains, such as whole wheat pasta, brown rice, or whole grain bread. Fill about one quarter of your plate with whole grains. ? Eat or drink low-fat dairy products, such as skim milk or low-fat yogurt. ? Avoid fatty cuts of meat, processed or cured meats, and poultry with skin. Fill about one quarter of your plate with lean proteins, such as fish, chicken without skin, beans, eggs, and tofu. ? Avoid premade and processed foods. These tend to be higher in sodium, added sugar, and fat.  Reduce your daily sodium intake. Most people with hypertension should eat less than 1,500 mg of sodium a day.  Limit alcohol intake to no more than 1 drink a day for nonpregnant women and 2 drinks a day for men. One drink equals 12 oz of beer, 5 oz of wine, or 1 oz of hard liquor. Lifestyle  Work with your health care provider to maintain a healthy body weight or to lose weight. Ask what an ideal weight is for you.  Get at least 30 minutes of exercise that causes your heart to beat faster (aerobic exercise) most days of the week. Activities may include walking, swimming, or biking.  Include exercise to strengthen your muscles (resistance exercise), such as pilates or lifting weights, as part of your weekly exercise routine. Try to do these types of exercises for 30 minutes at least 3 days a week.  Do not use any products that contain nicotine or tobacco, such as cigarettes and e-cigarettes. If you need help quitting, ask your health care provider.  Monitor your blood pressure at home as told by your health care provider.  Keep all follow-up visits as told by your health care provider. This is important. Medicines  Take over-the-counter and prescription  medicines only as told by your health care provider. Follow directions carefully. Blood pressure medicines must be taken as prescribed.  Do not skip doses of blood pressure medicine. Doing this puts you at risk for problems and can make the medicine less effective.  Ask your health care provider about side effects or reactions to medicines that you should watch for. Contact a health care provider if:  You think you are having a reaction to a medicine you are taking.  You have headaches that keep coming back (recurring).  You feel dizzy.  You  have swelling in your ankles.  You have trouble with your vision. Get help right away if:  You develop a severe headache or confusion.  You have unusual weakness or numbness.  You feel faint.  You have severe pain in your chest or abdomen.  You vomit repeatedly.  You have trouble breathing. Summary  Hypertension is when the force of blood pumping through your arteries is too strong. If this condition is not controlled, it may put you at risk for serious complications.  Your personal target blood pressure may vary depending on your medical conditions, your age, and other factors. For most people, a normal blood pressure is less than 120/80.  Hypertension is treated with lifestyle changes, medicines, or a combination of both. Lifestyle changes include weight loss, eating a healthy, low-sodium diet, exercising more, and limiting alcohol. This information is not intended to replace advice given to you by your health care provider. Make sure you discuss any questions you have with your health care provider. Document Released: 08/31/2005 Document Revised: 07/29/2016 Document Reviewed: 07/29/2016 Elsevier Interactive Patient Education  2018 [employer].     IF you received an x-ray today, you will receive an invoice from [facility]. Please contact [facility] at [phone] with questions or concerns regarding your  invoice.   IF you received labwork today, you will receive an invoice from [facility]. Please contact [facility] at [phone] with questions or concerns regarding your invoice.   Our billing staff will not be able to assist you with questions regarding bills from these companies.  You will be contacted with the lab results as soon as they are available. The fastest way to get your results is to activate your My Chart account. Instructions are located on the last page of this paperwork. If you have not heard from [place] regarding the results in 2 weeks, please contact this office.

## 2017-05-11 ENCOUNTER — Other Ambulatory Visit: Payer: Self-pay | Admitting: Emergency Medicine

## 2017-05-11 DIAGNOSIS — E785 Hyperlipidemia, unspecified: Secondary | ICD-10-CM

## 2017-05-11 NOTE — Telephone Encounter (Signed)
Refill request for simvastatin 40 mg denied.  Pt needs ov.  Will send to schedulers to call pt to setup appt.

## 2017-05-12 ENCOUNTER — Other Ambulatory Visit: Payer: Self-pay | Admitting: Physician Assistant

## 2017-05-13 ENCOUNTER — Telehealth: Payer: Self-pay | Admitting: Family Medicine

## 2017-05-13 NOTE — Telephone Encounter (Signed)
LMOM FOR PT TO CALL AND SCHEDULE AN  OV FOR MED REFILL ON SIMVASTATIN

## 2017-05-15 ENCOUNTER — Other Ambulatory Visit: Payer: Self-pay | Admitting: Endocrinology

## 2017-05-20 ENCOUNTER — Other Ambulatory Visit: Payer: Self-pay | Admitting: Physician Assistant

## 2017-05-20 ENCOUNTER — Other Ambulatory Visit (INDEPENDENT_AMBULATORY_CARE_PROVIDER_SITE_OTHER): Payer: 59

## 2017-05-20 ENCOUNTER — Encounter: Payer: Self-pay | Admitting: Endocrinology

## 2017-05-20 DIAGNOSIS — E890 Postprocedural hypothyroidism: Secondary | ICD-10-CM | POA: Diagnosis not present

## 2017-05-20 LAB — T4, FREE: Free T4: 0.97 ng/dL (ref 0.60–1.60)

## 2017-05-20 LAB — TSH: TSH: 3.07 u[IU]/mL (ref 0.35–4.50)

## 2017-05-20 NOTE — Telephone Encounter (Signed)
Rx faxed to the pharmacy.

## 2017-05-20 NOTE — Telephone Encounter (Signed)
Last rx for Xanax 04/16/17 #90  CSC: 01/14/17 UDS: 01/15/17 low risk  Please advise

## 2017-05-21 ENCOUNTER — Ambulatory Visit (INDEPENDENT_AMBULATORY_CARE_PROVIDER_SITE_OTHER): Payer: 59

## 2017-05-21 ENCOUNTER — Encounter: Payer: Self-pay | Admitting: *Deleted

## 2017-05-21 DIAGNOSIS — I493 Ventricular premature depolarization: Secondary | ICD-10-CM | POA: Diagnosis not present

## 2017-05-24 ENCOUNTER — Ambulatory Visit: Payer: 59 | Admitting: Endocrinology

## 2017-05-24 ENCOUNTER — Encounter: Payer: Self-pay | Admitting: Endocrinology

## 2017-05-24 ENCOUNTER — Ambulatory Visit (INDEPENDENT_AMBULATORY_CARE_PROVIDER_SITE_OTHER): Payer: 59 | Admitting: Endocrinology

## 2017-05-24 VITALS — BP 144/88 | HR 64 | Ht 69.0 in | Wt 212.4 lb

## 2017-05-24 DIAGNOSIS — I10 Essential (primary) hypertension: Secondary | ICD-10-CM | POA: Diagnosis not present

## 2017-05-24 DIAGNOSIS — E890 Postprocedural hypothyroidism: Secondary | ICD-10-CM

## 2017-05-24 MED ORDER — HYDROCHLOROTHIAZIDE 12.5 MG PO CAPS: 12.5000 mg | ORAL_CAPSULE | Freq: Every day | ORAL | 1 refills | Status: DC

## 2017-05-24 NOTE — Progress Notes (Signed)
Patient ID: Jesse Europe [name], male   DOB: 06-04-1961, 56 y.o.   MRN: [id number]    Reason for Appointment:  Hypothyroidism, followup visit   History of Present Illness:   The hypothyroidism was first diagnosed in 11/13. Previously had I-131 treatment for Graves' disease He was initially started with 88 mcg but his dose needed to be increased progressively  Complaints are reported by the patient now are none, no complaints of fatigue Still has no complaints of shakiness, sweating or palpitations  He has been treated with brand name SYNTHROID, now taking 125 g, previously was on 112 mcg until 7/18  He thinks his energy level is better with increasing the dose TSH was normal in 9/18, previously had been checked by his cardiologist               Compliance with the medication has been as prescribed with taking the tablet in the morning before breakfast.  Wt Readings from Last 3 Encounters:  05/24/17 212 lb 6.4 oz (96.3 kg)  04/19/17 208 lb (94.3 kg)  03/23/17 207 lb 12.8 oz (94.3 kg)    LABS:    Lab Results  Component Value Date   TSH 3.07 05/20/2017   TSH 9.040 (H) 03/23/2017   TSH 1.91 09/11/2016   FREET4 0.97 05/20/2017   FREET4 1.13 03/23/2017   FREET4 0.86 09/11/2016     Allergies as of 05/24/2017      Reactions   Sulfa Drugs Cross Reactors Hives   Codeine Other (See Comments)   Made him very jittery       Medication List       Accurate as of 05/24/17 11:59 PM. Always use your most recent med list.          ALPRAZolam 0.5 MG tablet Commonly known as:  XANAX TAKE 1 TABLET BY MOUTH 3 TIMES A DAY   aspirin 81 MG tablet Take 81 mg by mouth every other day.   carvedilol 12.5 MG tablet Commonly known as:  COREG Take 1.5 tablets (18.75 mg total) by mouth 2 (two) times daily.   flecainide 100 MG tablet Commonly known as:  TAMBOCOR Take 1 tablet (100 mg total) by mouth 2 (two) times daily.   FLUoxetine 40 MG capsule Commonly known as:   PROZAC Take 2 capsules (80 mg total) by mouth daily.   hydrochlorothiazide 12.5 MG capsule Commonly known as:  MICROZIDE Take 1 capsule (12.5 mg total) by mouth daily.   ipratropium 0.03 % nasal spray Commonly known as:  ATROVENT USE 2 SPRAYS INTO BOTH NOSTRIL 3 TIMES DAILY   levocetirizine 5 MG tablet Commonly known as:  XYZAL Take 5 mg by mouth every evening.   lisinopril 20 MG tablet Commonly known as:  PRINIVIL,ZESTRIL TAKE 1 TABLET BY MOUTH EVERY DAY   simvastatin 40 MG tablet Commonly known as:  ZOCOR TAKE 1 TABLET BY MOUTH AT BEDTIME.   SYNTHROID 125 MCG tablet Generic drug:  levothyroxine TAKE ONE TABLET BY MOUTH EVERY DAY BEFORE BREAKFAST   VENTOLIN HFA 108 (90 Base) MCG/ACT inhaler Generic drug:  albuterol INHALE 2 PUFFS INTO THE LUNGS EVERY 6 HOURS AS NEEDED FOR WHEEZING/SHORTNESS OF BREATH            Discharge Care Instructions        Start     Ordered   05/24/17 0000  hydrochlorothiazide (MICROZIDE) 12.5 MG capsule  Daily     05/24/17 1552      Past Medical History:  Diagnosis Date  . Allergy   . Anxiety   . Atrial fibrillation ([place])   . Bell palsy   . Depression   . Diastolic heart failure   . Hypercholesterolemia   . Hypertension   . Pneumonia   . Thyroid disease     Past Surgical History:  Procedure Laterality Date  . CHOLECYSTECTOMY    . HERNIA REPAIR    . KNEE SURGERY    . VASECTOMY      Family History  Problem Relation Age of Onset  . Heart disease Mother   . Heart attack Mother   . Other Mother        thyroid problems  . Kidney cancer Mother   . Cirrhosis Father   . Other Father        thyroid problems  . Alcohol abuse Father   . Hypertension Father   . Mental illness Brother   . Parkinsonism Brother     Social History:  reports that he has been smoking Cigarettes.  He has a 30.00 pack-year smoking history. He has never used smokeless tobacco. He reports that he drinks alcohol. He reports that he does not use  drugs.  Allergies:  Allergies  Allergen Reactions  . Sulfa Drugs Cross Reactors Hives  . Codeine Other (See Comments)    Made him very jittery     REVIEW of systems:  He has Gained some weight recently   Wt Readings from Last 3 Encounters:  05/24/17 212 lb 6.4 oz (96.3 kg)  04/19/17 208 lb (94.3 kg)  03/23/17 207 lb 12.8 oz (94.3 kg)    HYPERTENSION: He has had long-standing hypertension which is  managed by PCP with lisinopril 20 and Coreg1-1/2 tablets twice a day His cardiologist had increased to [place] but his blood pressure is still high.   Home BP 170/120 using a [employer] brand monitor and is not scheduled to see PCP soon  He has had a history of hypercholesterolemia followed by PCP     Examination:   BP (!) 144/88 (BP Location: Right Arm)   Pulse 64   Ht 5\' 9"  (1.753 m)   Wt 212 lb 6.4 oz (96.3 kg)   SpO2 96%   BMI 31.37 kg/m    Blood pressure was slightly lower on repeat management No pedal edema Biceps reflexes normal    Assessments   Hypothyroidism, post I-131 ablation  Previously had been requiring relatively lower doses of Synthroid but his TSH was unexpectedly high in July With going up to 125 Synthroid he is feeling better and his TSH is back in normal range He is very compliant with taking his medication without any interacting substances He will continue the same dose and have his TSH checked on his next follow-up  HYPERTENSION: He is asking for treatment for his high blood pressures Since he is already on Coreg and lisinopril he will add HCTZ 12.5 mg daily Follow-up in 1 month and will also need to recheck his renal function which was previously checked in May    [facility] 05/25/2017, 12:21 PM

## 2017-06-01 ENCOUNTER — Ambulatory Visit (INDEPENDENT_AMBULATORY_CARE_PROVIDER_SITE_OTHER): Payer: 59 | Admitting: Internal Medicine

## 2017-06-01 ENCOUNTER — Encounter: Payer: Self-pay | Admitting: Internal Medicine

## 2017-06-01 VITALS — BP 150/110 | HR 62 | Ht 69.0 in | Wt 211.6 lb

## 2017-06-01 DIAGNOSIS — I4891 Unspecified atrial fibrillation: Secondary | ICD-10-CM

## 2017-06-01 DIAGNOSIS — I10 Essential (primary) hypertension: Secondary | ICD-10-CM | POA: Diagnosis not present

## 2017-06-01 DIAGNOSIS — I493 Ventricular premature depolarization: Secondary | ICD-10-CM

## 2017-06-01 NOTE — Progress Notes (Signed)
HPI Jesse Suarez returns today for followup of his PVC's. He is a 56 yo man with persistent atrial fib due to thyrotoxicosis which has resolved. He developed PVC's and has had difficult to control HTN. The patient was placed on flecainide to control his PVC's and he underwent repeat 24 hour holter which demonstrated a marked reduction of PVC's from over 20K over 24 hours to less than one thousand PVC's in 24 hours. He admits to sodium indiscretion. No syncope or chest pain. Allergies  Allergen Reactions  . Sulfa Drugs Cross Reactors Hives  . Codeine Other (See Comments)    Made him very jittery      Current Outpatient Prescriptions  Medication Sig Dispense Refill  . ALPRAZolam (XANAX) 0.5 MG tablet TAKE 1 TABLET BY MOUTH 3 TIMES A DAY 90 tablet 1  . aspirin 81 MG tablet Take 81 mg by mouth every other day.     . carvedilol (COREG) 12.5 MG tablet Take 1.5 tablets (18.75 mg total) by mouth 2 (two) times daily. 270 tablet 3  . flecainide (TAMBOCOR) 100 MG tablet Take 1 tablet (100 mg total) by mouth 2 (two) times daily. 60 tablet 3  . FLUoxetine (PROZAC) 40 MG capsule Take 2 capsules (80 mg total) by mouth daily. 180 capsule 3  . hydrochlorothiazide (MICROZIDE) 12.5 MG capsule Take 1 capsule (12.5 mg total) by mouth daily. 30 capsule 1  . ipratropium (ATROVENT) 0.03 % nasal spray USE 2 SPRAYS INTO BOTH NOSTRIL 3 TIMES DAILY 30 mL 3  . levocetirizine (XYZAL) 5 MG tablet Take 5 mg by mouth every evening.    Marland Kitchen lisinopril (PRINIVIL,ZESTRIL) 20 MG tablet TAKE 1 TABLET BY MOUTH EVERY DAY 30 tablet 5  . simvastatin (ZOCOR) 40 MG tablet TAKE 1 TABLET BY MOUTH AT BEDTIME. 90 tablet 3  . SYNTHROID 125 MCG tablet TAKE ONE TABLET BY MOUTH EVERY DAY BEFORE BREAKFAST 30 tablet 0  . VENTOLIN HFA 108 (90 Base) MCG/ACT inhaler INHALE 2 PUFFS INTO THE LUNGS EVERY 6 HOURS AS NEEDED FOR WHEEZING/SHORTNESS OF BREATH 18 Inhaler 5   No current facility-administered medications for this visit.      Past  Medical History:  Diagnosis Date  . Allergy   . Anxiety   . Atrial fibrillation ([place])   . Bell palsy   . Depression   . Diastolic heart failure   . Hypercholesterolemia   . Hypertension   . Pneumonia   . Thyroid disease     ROS:   All systems reviewed and negative except as noted in the HPI.   Past Surgical History:  Procedure Laterality Date  . CHOLECYSTECTOMY    . HERNIA REPAIR    . KNEE SURGERY    . VASECTOMY       Family History  Problem Relation Age of Onset  . Heart disease Mother   . Heart attack Mother   . Other Mother        thyroid problems  . Kidney cancer Mother   . Cirrhosis Father   . Other Father        thyroid problems  . Alcohol abuse Father   . Hypertension Father   . Mental illness Brother   . Parkinsonism Brother      Social History   Social History  . Marital status: Married    Spouse name: N/A  . Number of children: 1  . Years of education: N/A   Occupational History  . Not on file.  Social History Main Topics  . Smoking status: Current Every Day Smoker    Packs/day: 1.00    Years: 30.00    Types: Cigarettes  . Smokeless tobacco: Never Used  . Alcohol use 0.0 oz/week     Comment: rarely  . Drug use: No  . Sexual activity: No   Other Topics Concern  . 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      Tobacco: 1 ppd x 30 years      Alcohol: rare     Regular exercise: walking daily   Caffeine use: daily           BP (!) 150/110   Pulse 62   Ht 5\' 9"  (1.753 m)   Wt 211 lb 9.6 oz (96 kg)   SpO2 97%   BMI 31.25 kg/m   Physical Exam:  Well appearing NAD HEENT: Unremarkable Neck:  6 cm JVD, no thyromegally Lymphatics:  No adenopathy Back:  No CVA tenderness Lungs:  Clear with no wheezes HEART:  Regular rate rhythm, no murmurs, no  rubs, no clicks Abd:  soft, positive bowel sounds, no organomegally, no rebound, no guarding Ext:  2 plus pulses, no edema, no cyanosis, no clubbing Skin:  No rashes no nodules Neuro:  CN II through XII intact, motor grossly intact   Assess/Plan: 1. PVC's - the patient is much improved. He will continue his flecainide. 2. HTN - his blood pressure is 140/88 on my exam. He admits to eating too much salt and I have asked him to reduce his salt intake. I cannot increase his beta blocker at this point due to some sinus node dysfunction. 3. PAF - he has had no recurrent symptoms. He will continue his current meds.  4. H/o thyrotoxicosis - he has undergone iodine ablation and is now on thyroid supplementation.   Mikle Bosworth.D.

## 2017-06-01 NOTE — Patient Instructions (Addendum)
Medication Instructions:  °Your physician recommends that you continue on your current medications as directed. Please refer to the Current Medication list given to you today. ° °Labwork: °None ordered. ° °Testing/Procedures: °None ordered. ° °Follow-Up: °Your physician wants you to follow-up in: 4 months with Dr. Taylor.    ° ° ° °Any Other Special Instructions Will Be Listed Below (If Applicable). ° °If you need a refill on your cardiac medications before your next appointment, please call your pharmacy.  ° °

## 2017-06-09 IMAGING — CR DG CHEST 2V
2 series · 2 of 2 positions shown · non-contrast
Comparison: Chest radiograph February 20, 2016

CLINICAL DATA: Shortness of breath and hypertensive. History of
hypertension, smoker.

EXAM:
CHEST  2 VIEW

[chest pa]
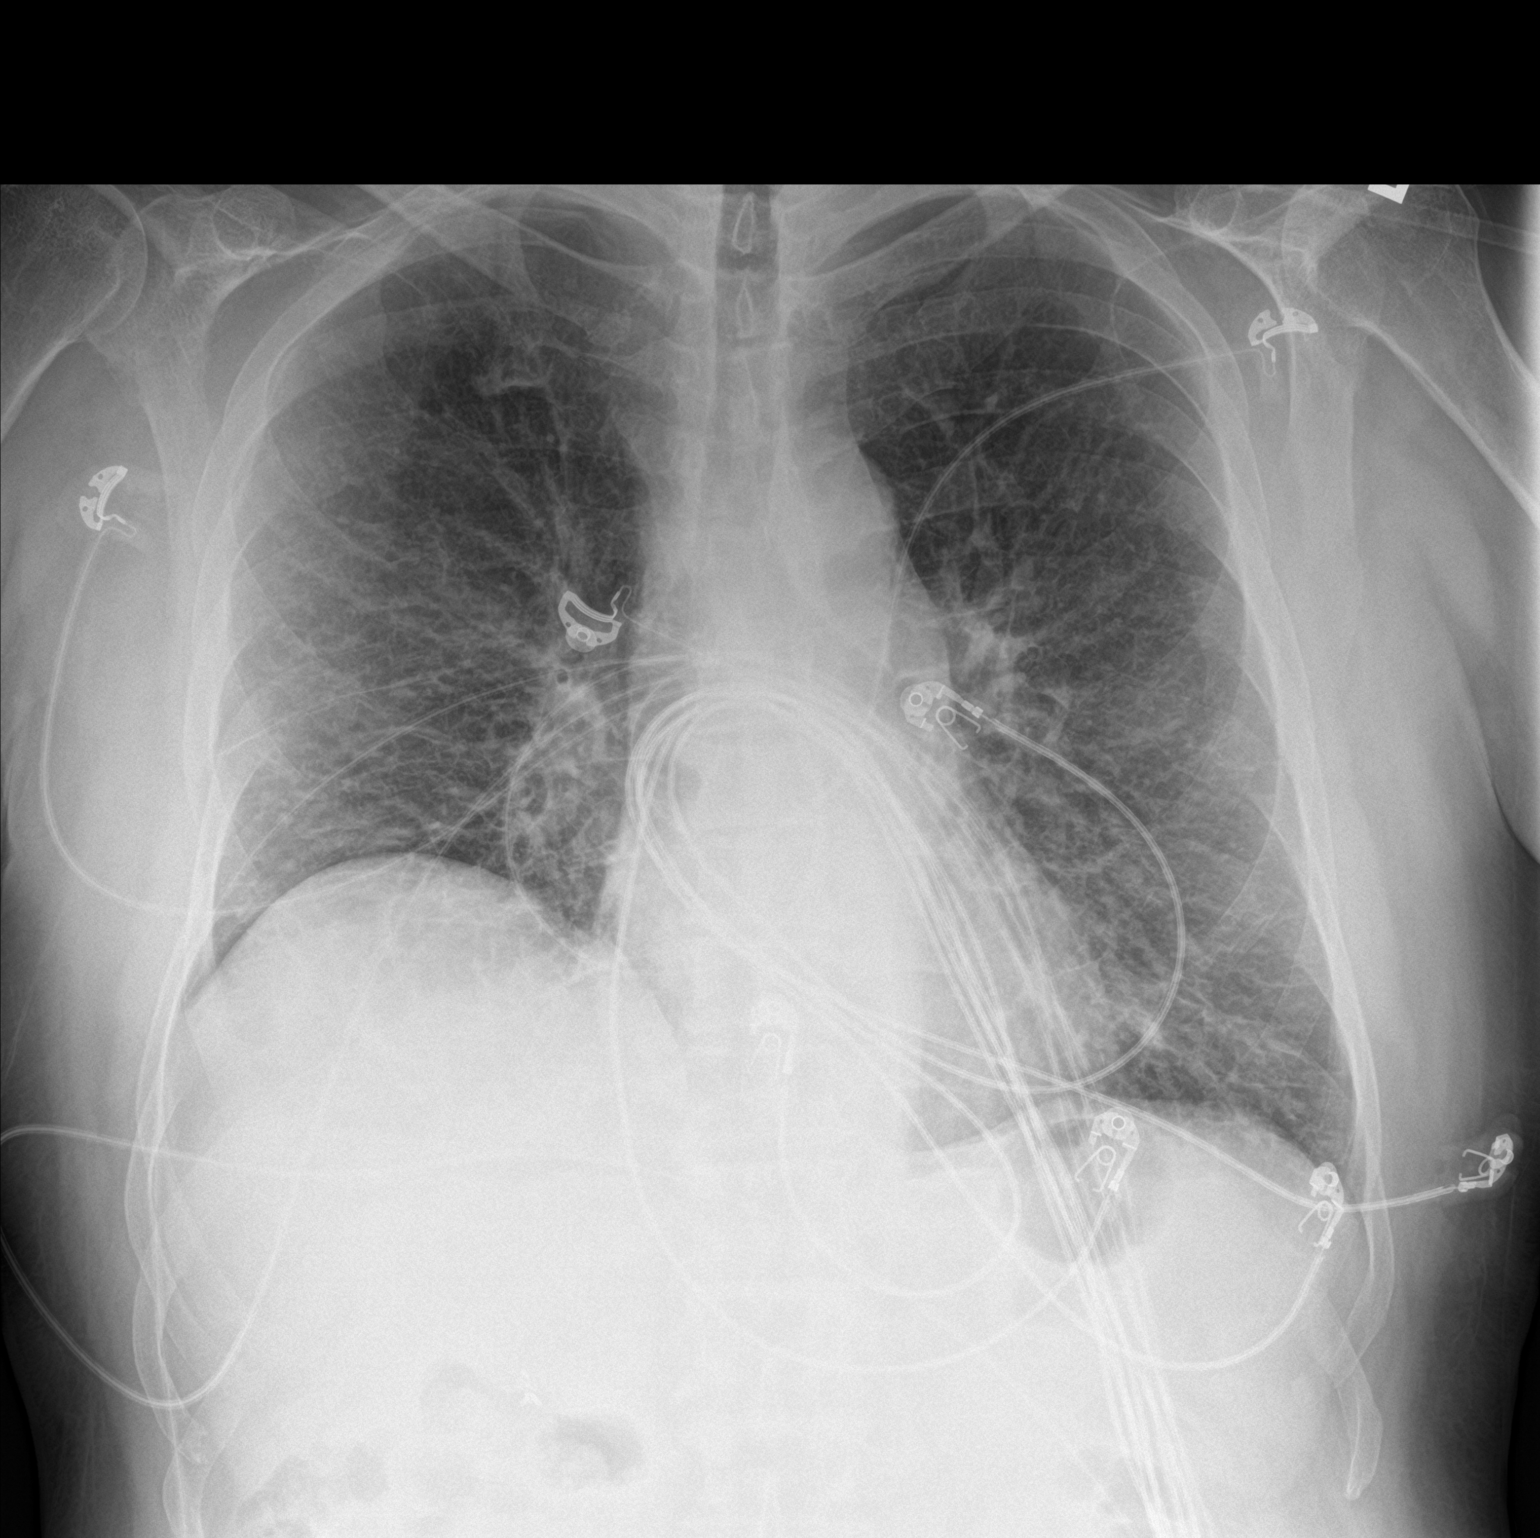

[chest lat]
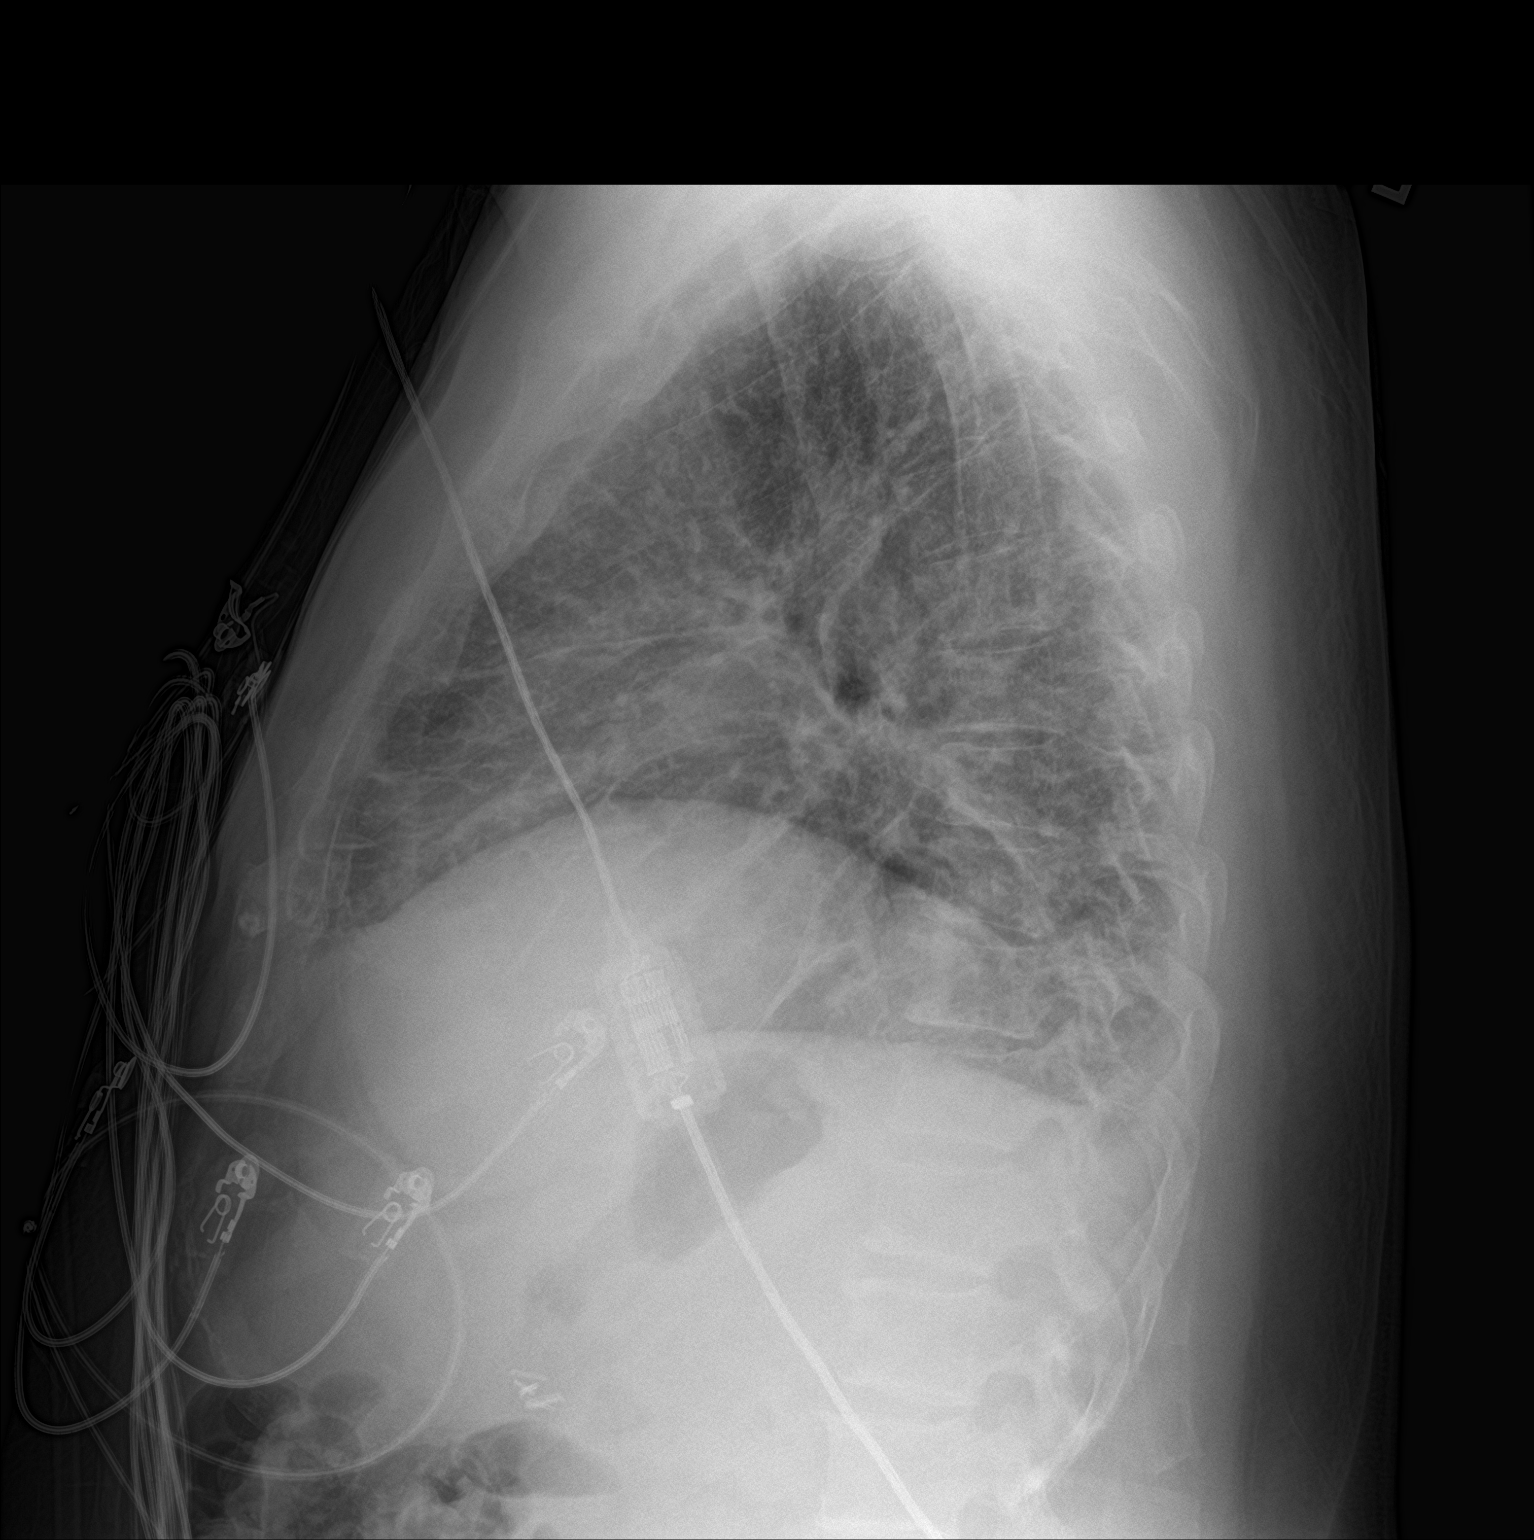

[2 of 2 positions shown; findings below may reference images not displayed]

FINDINGS: Cardiomediastinal silhouette is normal. No pleural effusions or
focal consolidations. Trachea projects midline and there is no
pneumothorax. Persistently elevated RIGHT hemidiaphragm. Similar
bronchitic changes with bibasilar atelectasis. Soft tissue planes
and included osseous structures are non-suspicious. Surgical clips
in the included right abdomen compatible with cholecystectomy.
IMPRESSION: Similar bronchitic changes with bibasilar atelectasis.

## 2017-06-13 ENCOUNTER — Other Ambulatory Visit: Payer: Self-pay | Admitting: Endocrinology

## 2017-06-15 ENCOUNTER — Other Ambulatory Visit: Payer: Self-pay | Admitting: Internal Medicine

## 2017-06-17 DIAGNOSIS — Z23 Encounter for immunization: Secondary | ICD-10-CM | POA: Diagnosis not present

## 2017-06-22 ENCOUNTER — Emergency Department (HOSPITAL_COMMUNITY)
Admission: EM | Admit: 2017-06-22 | Discharge: 2017-06-22 | Disposition: A | Discharge status: Home / Self Care | Payer: 59 | Attending: Emergency Medicine | Admitting: Emergency Medicine

## 2017-06-22 ENCOUNTER — Encounter (HOSPITAL_COMMUNITY): Payer: Self-pay

## 2017-06-22 DIAGNOSIS — K068 Other specified disorders of gingiva and edentulous alveolar ridge: Secondary | ICD-10-CM

## 2017-06-22 DIAGNOSIS — I4891 Unspecified atrial fibrillation: Secondary | ICD-10-CM | POA: Diagnosis not present

## 2017-06-22 DIAGNOSIS — I110 Hypertensive heart disease with heart failure: Secondary | ICD-10-CM | POA: Insufficient documentation

## 2017-06-22 DIAGNOSIS — Z79899 Other long term (current) drug therapy: Secondary | ICD-10-CM | POA: Insufficient documentation

## 2017-06-22 DIAGNOSIS — K0889 Other specified disorders of teeth and supporting structures: Secondary | ICD-10-CM | POA: Diagnosis present

## 2017-06-22 DIAGNOSIS — E039 Hypothyroidism, unspecified: Secondary | ICD-10-CM | POA: Insufficient documentation

## 2017-06-22 DIAGNOSIS — Z7982 Long term (current) use of aspirin: Secondary | ICD-10-CM | POA: Insufficient documentation

## 2017-06-22 DIAGNOSIS — I5030 Unspecified diastolic (congestive) heart failure: Secondary | ICD-10-CM | POA: Diagnosis not present

## 2017-06-22 DIAGNOSIS — F17210 Nicotine dependence, cigarettes, uncomplicated: Secondary | ICD-10-CM | POA: Diagnosis not present

## 2017-06-22 LAB — CBC WITH DIFFERENTIAL/PLATELET
Basophils Absolute: 0 10*3/uL (ref 0.0–0.1)
Basophils Relative: 0 %
Eosinophils Absolute: 0.2 10*3/uL (ref 0.0–0.7)
Eosinophils Relative: 1 %
HCT: 44 % (ref 39.0–52.0)
Hemoglobin: 15.5 g/dL (ref 13.0–17.0)
Lymphocytes Relative: 11 %
Lymphs Abs: 1.9 10*3/uL (ref 0.7–4.0)
MCH: 31.8 pg (ref 26.0–34.0)
MCHC: 35.2 g/dL (ref 30.0–36.0)
MCV: 90.3 fL (ref 78.0–100.0)
Monocytes Absolute: 1.4 10*3/uL — ABNORMAL HIGH (ref 0.1–1.0)
Monocytes Relative: 8 %
Neutro Abs: 13.7 10*3/uL — ABNORMAL HIGH (ref 1.7–7.7)
Neutrophils Relative %: 80 %
Platelets: 248 10*3/uL (ref 150–400)
RBC: 4.87 MIL/uL (ref 4.22–5.81)
RDW: 13.1 % (ref 11.5–15.5)
WBC: 17.2 10*3/uL — ABNORMAL HIGH (ref 4.0–10.5)

## 2017-06-22 LAB — PROTIME-INR
INR: 1.02
Prothrombin Time: 13.3 seconds (ref 11.4–15.2)

## 2017-06-22 NOTE — Discharge Instructions (Signed)
Keep pressure on extraction areas by biting on gauze. You can also bite on tea bags, as this can sometimes slow bleeding Call your dentist if bleeding continues tomorrow.

## 2017-06-22 NOTE — ED Triage Notes (Signed)
Patient states he had 2 teeth pulled on the left upper jaw. Patient states he woke up and having blood on his pillow. Patient stated, "I did not know what to do."

## 2017-06-23 NOTE — ED Provider Notes (Signed)
[facility] DEPT Provider Note   CSN: [id number] Arrival date & time: 06/22/17  5625     History   Chief Complaint Chief Complaint  Patient presents with  . Dental Pain    HPI Jesse PARSON [name] is a 56 y.o. male.Complaint is postextraction dental bleeding  HPI 56 year old male. Had extraction of his third, and first left maxillary molarstory. He's had some oozing since then. States that he had some blood on his pelvis morning. He did not know what to do. He did not contact his dentist. He presents here.  History of bleeding diaphysis. He takes baby aspirin daily. History of paroxysmal atrial fibrillation. Is not anticoagulated. No history of easy bruising, bleeding, mucosal bleeding, or family history of coagulation disorder or von Willebrand's.  Past Medical History:  Diagnosis Date  . Allergy   . Anxiety   . Atrial fibrillation ([place])   . Bell palsy   . Depression   . Diastolic heart failure   . Hypercholesterolemia   . Hypertension   . Pneumonia   . Thyroid disease     Patient Active Problem List   Diagnosis Date Noted  . Ventricular trigeminy 01/06/2017  . Bacterial sinusitis 12/08/2016  . Bone neoplasm 02/25/2015  . History of atrial fibrillation 10/16/2014  . Nicotine addiction 08/07/2013  . Depression with anxiety 02/20/2012  . ED (erectile dysfunction) 02/20/2012  . Hx of Bell's palsy 02/20/2012  . Amputated toe ([place]) 02/20/2012  . Hypothyroidism following radioiodine therapy 01/15/2011  . Hypertension 01/15/2011  . Dyslipidemia 01/15/2011    Past Surgical History:  Procedure Laterality Date  . CHOLECYSTECTOMY    . HERNIA REPAIR    . KNEE SURGERY    . VASECTOMY         Home Medications    Prior to Admission medications   Medication Sig Start Date End Date Taking? Authorizing Provider  ALPRAZolam [name] 0.5 MG tablet TAKE 1 TABLET BY MOUTH 3 TIMES A DAY 05/20/17   Brunetta Jeans, PA-C  aspirin 81 MG tablet Take 81 mg by mouth every  other day.     [provider]  carvedilol (COREG) 12.5 MG tablet Take 1.5 tablets (18.75 mg total) by mouth 2 (two) times daily. 03/23/17   Evans Lance, MD  flecainide (TAMBOCOR) 100 MG tablet TAKE 1 TABLET BY MOUTH TWICE A DAY 06/15/17   Evans Lance, MD  FLUoxetine (PROZAC) 40 MG capsule Take 2 capsules (80 mg total) by mouth daily. 05/22/16   Darlyne Russian, MD  hydrochlorothiazide (MICROZIDE) 12.5 MG capsule Take 1 capsule (12.5 mg total) by mouth daily. 05/24/17   [name], MD  ipratropium (ATROVENT) 0.03 % nasal spray USE 2 SPRAYS INTO BOTH NOSTRIL 3 TIMES DAILY 05/12/17   Brunetta Jeans, PA-C  levocetirizine (XYZAL) 5 MG tablet Take 5 mg by mouth every evening.    [provider]  lisinopril (PRINIVIL,ZESTRIL) 20 MG tablet TAKE 1 TABLET BY MOUTH EVERY DAY 03/11/17   Brunetta Jeans, PA-C  simvastatin (ZOCOR) 40 MG tablet TAKE 1 TABLET BY MOUTH AT BEDTIME. 06/09/16   Daub, Loura Back, MD  SYNTHROID 125 MCG tablet TAKE ONE TABLET BY MOUTH EVERY DAY BEFORE BREAKFAST 06/13/17   [name], MD  VENTOLIN HFA 108 [phone] Base) MCG/ACT inhaler INHALE 2 PUFFS INTO THE LUNGS EVERY 6 HOURS AS NEEDED FOR WHEEZING/SHORTNESS OF BREATH 01/05/17   Brunetta Jeans, PA-C    Family History Family History  Problem Relation Age of Onset  .  Heart disease Mother   . Heart attack Mother   . Other Mother        thyroid problems  . Kidney cancer Mother   . Cirrhosis Father   . Other Father        thyroid problems  . Alcohol abuse Father   . Hypertension Father   . Mental illness Brother   . Parkinsonism Brother     Social History Social History  Substance Use Topics  . Smoking status: Current Every Day Smoker    Packs/day: 1.00    Years: 30.00    Types: Cigarettes  . Smokeless tobacco: Never Used  . Alcohol use 0.0 oz/week     Comment: rarely     Allergies   Sulfa drugs cross reactors and Codeine   Review of Systems Review of Systems  Constitutional: Negative for  appetite change, chills, diaphoresis, fatigue and fever.  HENT: Positive for dental problem. Negative for mouth sores, sore throat and trouble swallowing.        Gum bleeding  Eyes: Negative for visual disturbance.  Respiratory: Negative for cough, chest tightness, shortness of breath and wheezing.   Cardiovascular: Negative for chest pain.  Gastrointestinal: Negative for abdominal distention, abdominal pain, diarrhea, nausea and vomiting.  Endocrine: Negative for polydipsia, polyphagia and polyuria.  Genitourinary: Negative for dysuria, frequency and hematuria.  Musculoskeletal: Negative for gait problem.  Skin: Negative for color change, pallor and rash.  Neurological: Negative for dizziness, syncope, light-headedness and headaches.  Hematological: Does not bruise/bleed easily.  Psychiatric/Behavioral: Negative for behavioral problems and confusion.     Physical Exam Updated Vital Signs BP 112/81   Pulse (!) 55   Temp 97.9 F (36.6 C) (Oral)   Resp 18   Ht 5\' 9"  (1.753 m)   Wt 95.3 kg (210 lb)   SpO2 98%   BMI 31.01 kg/m   Physical Exam  Constitutional: He is oriented to person, place, and time. He appears well-developed and well-nourished. No distress.  HENT:  Head: Normocephalic.  Capillary oozing from the [place] of his extracted first and third molars, teeth #14 and 16.  Eyes: Pupils are equal, round, and reactive to light. Conjunctivae are normal. No scleral icterus.  Neck: Normal range of motion. Neck supple. No thyromegaly present.  Cardiovascular: Normal rate and regular rhythm.  Exam reveals no gallop and no friction rub.   No murmur heard. Pulmonary/Chest: Effort normal and breath sounds normal. No respiratory distress. He has no wheezes. He has no rales.  Abdominal: Soft. Bowel sounds are normal. He exhibits no distension. There is no tenderness. There is no rebound.  Musculoskeletal: Normal range of motion.  Neurological: He is alert and oriented to person, place,  and time.  Skin: Skin is warm and dry. No rash noted.  No stigmata of coagulopathy. No bruising or ecchymosis. No petechiae.  Psychiatric: He has a normal mood and affect. His behavior is normal.     ED Treatments / Results  Labs (all labs ordered are listed, but only abnormal results are displayed) Labs Reviewed  CBC WITH DIFFERENTIAL/PLATELET - Abnormal; Notable for the following:       Result Value   WBC 17.2 (*)    Neutro Abs 13.7 (*)    Monocytes Absolute 1.4 (*)    All other components within normal limits  PROTIME-INR    EKG  EKG Interpretation None       Radiology No results found.  Procedures Procedures (including critical care time)  Medications Ordered  in ED Medications - No data to display   Initial Impression / Assessment and Plan / ED Course  I have reviewed the triage vital signs and the nursing notes.  Pertinent labs & imaging results that were available during my care of the patient were reviewed by me and considered in my medical decision making (see chart for details).    PLT count, coags, and hemoglobin normal. I had him bite on sponges impregnated with TX A. Had improvement of his bleeding. We'll continue to use pressure with gauze at home. Contact his dentist is symptoms persist  Final Clinical Impressions(s) / ED Diagnoses   Final diagnoses:  Gingival bleeding    New Prescriptions Discharge Medication List as of 06/22/2017 12:38 PM       Tanna Furry, MD 06/23/17 [phone]

## 2017-07-01 ENCOUNTER — Other Ambulatory Visit: Payer: Self-pay | Admitting: Physician Assistant

## 2017-07-02 ENCOUNTER — Other Ambulatory Visit (INDEPENDENT_AMBULATORY_CARE_PROVIDER_SITE_OTHER): Payer: 59

## 2017-07-02 ENCOUNTER — Ambulatory Visit: Payer: 59 | Admitting: Physician Assistant

## 2017-07-02 ENCOUNTER — Encounter: Payer: Self-pay | Admitting: Endocrinology

## 2017-07-02 DIAGNOSIS — E890 Postprocedural hypothyroidism: Secondary | ICD-10-CM | POA: Diagnosis not present

## 2017-07-02 DIAGNOSIS — I10 Essential (primary) hypertension: Secondary | ICD-10-CM | POA: Diagnosis not present

## 2017-07-02 LAB — BASIC METABOLIC PANEL
BUN: 8 mg/dL (ref 6–23)
CO2: 29 mEq/L (ref 19–32)
Calcium: 9.2 mg/dL (ref 8.4–10.5)
Chloride: 88 mEq/L — ABNORMAL LOW (ref 96–112)
Creatinine, Ser: 0.98 mg/dL (ref 0.40–1.50)
GFR: 83.33 mL/min (ref >60.00)
Glucose, Bld: 81 mg/dL (ref 70–99)
Potassium: 4 mEq/L (ref 3.5–5.1)
Sodium: 124 mEq/L — ABNORMAL LOW (ref 135–145)

## 2017-07-02 LAB — TSH: TSH: 10.28 u[IU]/mL — ABNORMAL HIGH (ref 0.35–4.50)

## 2017-07-05 ENCOUNTER — Encounter: Payer: Self-pay | Admitting: Physician Assistant

## 2017-07-05 ENCOUNTER — Encounter: Payer: Self-pay | Admitting: Endocrinology

## 2017-07-05 ENCOUNTER — Ambulatory Visit (INDEPENDENT_AMBULATORY_CARE_PROVIDER_SITE_OTHER): Payer: 59 | Admitting: Endocrinology

## 2017-07-05 VITALS — BP 160/90 | HR 64 | Ht 69.0 in | Wt 213.0 lb

## 2017-07-05 DIAGNOSIS — E890 Postprocedural hypothyroidism: Secondary | ICD-10-CM | POA: Diagnosis not present

## 2017-07-05 DIAGNOSIS — I10 Essential (primary) hypertension: Secondary | ICD-10-CM | POA: Diagnosis not present

## 2017-07-05 MED ORDER — SYNTHROID 137 MCG PO TABS: 137.0000 ug | ORAL_TABLET | Freq: Every day | ORAL | 3 refills | Status: DC

## 2017-07-05 MED ORDER — CLONIDINE 0.1 MG/24HR TD PTWK: 0.1000 mg | MEDICATED_PATCH | TRANSDERMAL | 12 refills | Status: DC

## 2017-07-05 MED ORDER — LISINOPRIL 40 MG PO TABS: 40.0000 mg | ORAL_TABLET | Freq: Every day | ORAL | 3 refills | Status: DC

## 2017-07-05 NOTE — Progress Notes (Signed)
Patient ID: Jesse Europe [name], male   DOB: 22-Mar-1961, 56 y.o.   MRN: [id number]    Reason for Appointment:  Hypothyroidism and hypertension, followup visit    History of Present Illness:   The hypothyroidism was first diagnosed in 11/13. Previously had I-131 treatment for Graves' disease He was initially started with 88 mcg but his dose needed to be increased progressively  Complaints are reported by the patient now are none, no complaints of fatigue Still has no complaints of shakiness, sweating or palpitations  He has been treated with brand name SYNTHROID, now taking 125 g, previously was on 112 mcg until 7/18    Although his thyroid level is back to normal with increasing the Synthroid his TSH is now 10.3 He says he has not missed his medications and is not taking any vitamins or other medications with his thyroid pills  He thinks his energy level is still fairly good, weight may be up a couple of pounds  Wt Readings from Last 3 Encounters:  07/05/17 213 lb (96.6 kg)  06/22/17 210 lb (95.3 kg)  06/01/17 211 lb 9.6 oz (96 kg)    LABS:    Lab Results  Component Value Date   TSH 10.28 (H) 07/02/2017   TSH 3.07 05/20/2017   TSH 9.040 (H) 03/23/2017   FREET4 0.97 05/20/2017   FREET4 1.13 03/23/2017   FREET4 0.86 09/11/2016    HYPERTENSION management: See review of systems    Allergies as of 07/05/2017      Reactions   Sulfa Drugs Cross Reactors Hives   Codeine Other (See Comments)   Made him very jittery       Medication List       Accurate as of 07/05/17  2:01 PM. Always use your most recent med list.          ALPRAZolam 0.5 MG tablet Commonly known as:  XANAX TAKE 1 TABLET BY MOUTH 3 TIMES A DAY   aspirin 81 MG tablet Take 81 mg by mouth every other day.   carvedilol 12.5 MG tablet Commonly known as:  COREG Take 1.5 tablets (18.75 mg total) by mouth 2 (two) times daily.   flecainide 100 MG tablet Commonly known as:   TAMBOCOR TAKE 1 TABLET BY MOUTH TWICE A DAY   FLUoxetine 40 MG capsule Commonly known as:  PROZAC TAKE 2 CAPSULES BY MOUTH DAILY   hydrochlorothiazide 12.5 MG capsule Commonly known as:  MICROZIDE Take 1 capsule (12.5 mg total) by mouth daily.   ipratropium 0.03 % nasal spray Commonly known as:  ATROVENT USE 2 SPRAYS INTO BOTH NOSTRIL 3 TIMES DAILY   lisinopril 20 MG tablet Commonly known as:  PRINIVIL,ZESTRIL TAKE 1 TABLET BY MOUTH EVERY DAY   simvastatin 40 MG tablet Commonly known as:  ZOCOR TAKE 1 TABLET BY MOUTH AT BEDTIME.   SYNTHROID 125 MCG tablet Generic drug:  levothyroxine TAKE ONE TABLET BY MOUTH EVERY DAY BEFORE BREAKFAST   VENTOLIN HFA 108 (90 Base) MCG/ACT inhaler Generic drug:  albuterol INHALE 2 PUFFS INTO THE LUNGS EVERY 6 HOURS AS NEEDED FOR WHEEZING/SHORTNESS OF BREATH       Past Medical History:  Diagnosis Date  . Allergy   . Anxiety   . Atrial fibrillation ([place])   . Bell palsy   . Depression   . Diastolic heart failure   . Hypercholesterolemia   . Hypertension   . Pneumonia   . Thyroid disease     Past Surgical  History:  Procedure Laterality Date  . CHOLECYSTECTOMY    . HERNIA REPAIR    . KNEE SURGERY    . VASECTOMY      Family History  Problem Relation Age of Onset  . Heart disease Mother   . Heart attack Mother   . Other Mother        thyroid problems  . Kidney cancer Mother   . Cirrhosis Father   . Other Father        thyroid problems  . Alcohol abuse Father   . Hypertension Father   . Mental illness Brother   . Parkinsonism Brother     Social History:  reports that he has been smoking Cigarettes.  He has a 30.00 pack-year smoking history. He has never used smokeless tobacco. He reports that he drinks alcohol. He reports that he does not use drugs.  Allergies:  Allergies  Allergen Reactions  . Sulfa Drugs Cross Reactors Hives  . Codeine Other (See Comments)    Made him very jittery     REVIEW of  systems:   HYPERTENSION: He has had long-standing hypertension which is  managed by PCP with lisinopril 20 and Coreg 1-1/2 tablets twice a day  On his last visit for his hypothyroidism his blood pressure was relatively high and he was already taking increased doses of Coreg He has been started on HCTZ which he has taken He has not checked his blood pressure since he went to the ER about 10 days ago Otherwise he thinks his blood pressure has been variable, using a [employer] prime monitor  His blood pressure is still significantly high and he is taking his other 2 medications also regularly He has not taken any decongestants OTC  However his sodium is only 124 now   BP Readings from Last 3 Encounters:  07/05/17 (!) 150/94  06/22/17 112/81  06/01/17 (!) 150/110    He is not scheduled to see PCP soon      Examination:   BP (!) 150/94   Pulse 64   Ht 5\' 9"  (1.753 m)   Wt 213 lb (96.6 kg)   SpO2 98%   BMI 31.45 kg/m    Blood pressure was slightly higher on repeat management No pedal edema No renal artery bruit heard but currently has excessive bowel sounds    Assessments   Hypothyroidism, post I-131 ablation  With going up to 125 Synthroid he had normal TSH last month but his TSH is surprisingly higher 10.3 with no change in his regimen for compliance He does not complain of fatigue however  For now he will increase the dose by 1 tablet weekly and then switched to the 137 g tablet until next visit   HYPERTENSION: This is not well controlled even with adding HCTZ Since he has hyponatremia from HCTZ he will stop this Start Catapres-TTS patch 0.1 mg weekly Increase lisinopril to 40 mg in check blood pressure regularly at home Follow up in 1 month   [place] 07/05/2017, 2:01 PM

## 2017-07-05 NOTE — Patient Instructions (Signed)
Check blood pressure at least 3 times a week  Bring blood pressure monitor for comparison on the next visit  STOP hydrochlorthiazide  Increase lisinopril to 2 tablets daily  Start new medication: Clonidine patch and change every week  With the 125 Synthroid take extra one tablet every week and on the next prescription start taking 137 micrograms daily

## 2017-07-10 ENCOUNTER — Other Ambulatory Visit: Payer: Self-pay | Admitting: Endocrinology

## 2017-07-10 ENCOUNTER — Ambulatory Visit (INDEPENDENT_AMBULATORY_CARE_PROVIDER_SITE_OTHER): Payer: 59 | Admitting: Urgent Care

## 2017-07-10 ENCOUNTER — Encounter: Payer: Self-pay | Admitting: Urgent Care

## 2017-07-10 VITALS — BP 160/90 | HR 70 | Temp 98.7°F | Resp 18 | Ht 69.0 in | Wt 207.8 lb

## 2017-07-10 DIAGNOSIS — F17200 Nicotine dependence, unspecified, uncomplicated: Secondary | ICD-10-CM | POA: Diagnosis not present

## 2017-07-10 DIAGNOSIS — J069 Acute upper respiratory infection, unspecified: Secondary | ICD-10-CM | POA: Diagnosis not present

## 2017-07-10 MED ORDER — BENZONATATE 100 MG PO CAPS: 100.0000 mg | ORAL_CAPSULE | Freq: Three times a day (TID) | ORAL | 0 refills | Status: DC | PRN

## 2017-07-10 MED ORDER — PREDNISONE 20 MG PO TABS: ORAL_TABLET | ORAL | 0 refills | Status: DC

## 2017-07-10 MED ORDER — HYDROCODONE-HOMATROPINE 5-1.5 MG/5ML PO SYRP: 5.0000 mL | ORAL_SOLUTION | Freq: Every evening | ORAL | 0 refills | Status: DC | PRN

## 2017-07-10 NOTE — Patient Instructions (Addendum)
Cough, Adult A cough helps to clear your throat and lungs. A cough may last only 2-3 weeks (acute), or it may last longer than 8 weeks (chronic). Many different things can cause a cough. A cough may be a sign of an illness or another medical condition. Follow these instructions at home:  Pay attention to any changes in your cough.  Take medicines only as told by your doctor. ? If you were prescribed an antibiotic medicine, take it as told by your doctor. Do not stop taking it even if you start to feel better. ? Talk with your doctor before you try using a cough medicine.  Drink enough fluid to keep your pee (urine) clear or pale yellow.  If the air is dry, use a cold steam vaporizer or humidifier in your home.  Stay away from things that make you cough at work or at home.  If your cough is worse at night, try using extra pillows to raise your head up higher while you sleep.  Do not smoke, and try not to be around smoke. If you need help quitting, ask your doctor.  Do not have caffeine.  Do not drink alcohol.  Rest as needed. Contact a doctor if:  You have new problems (symptoms).  You cough up yellow fluid (pus).  Your cough does not get better after 2-3 weeks, or your cough gets worse.  Medicine does not help your cough and you are not sleeping well.  You have pain that gets worse or pain that is not helped with medicine.  You have a fever.  You are losing weight and you do not know why.  You have night sweats. Get help right away if:  You cough up blood.  You have trouble breathing.  Your heartbeat is very fast. This information is not intended to replace advice given to you by your health care provider. Make sure you discuss any questions you have with your health care provider. Document Released: 05/14/2011 Document Revised: 02/06/2016 Document Reviewed: 11/07/2014 Elsevier Interactive Patient Education  2018 [place].    Cough, Adult Coughing is a  reflex that clears your throat and your airways. Coughing helps to heal and protect your lungs. It is normal to cough occasionally, but a cough that happens with other symptoms or lasts a long time may be a sign of a condition that needs treatment. A cough may last only 2-3 weeks (acute), or it may last longer than 8 weeks (chronic). What are the causes? Coughing is commonly caused by:  Breathing in substances that irritate your lungs.  A viral or bacterial respiratory infection.  Allergies.  Asthma.  Postnasal drip.  Smoking.  Acid backing up from the stomach into the esophagus (gastroesophageal reflux).  Certain medicines.  Chronic lung problems, including COPD (or rarely, lung cancer).  Other medical conditions such as heart failure.  Follow these instructions at home: Pay attention to any changes in your symptoms. Take these actions to help with your discomfort:  Take medicines only as told by your health care provider. ? If you were prescribed an antibiotic medicine, take it as told by your health care provider. Do not stop taking the antibiotic even if you start to feel better. ? Talk with your health care provider before you take a cough suppressant medicine.  Drink enough fluid to keep your urine clear or pale yellow.  If the air is dry, use a cold steam vaporizer or humidifier in your bedroom or your home  to help loosen secretions.  Avoid anything that causes you to cough at work or at home.  If your cough is worse at night, try sleeping in a semi-upright position.  Avoid cigarette smoke. If you smoke, quit smoking. If you need help quitting, ask your health care provider.  Avoid caffeine.  Avoid alcohol.  Rest as needed.  Contact a health care provider if:  You have new symptoms.  You cough up pus.  Your cough does not get better after 2-3 weeks, or your cough gets worse.  You cannot control your cough with suppressant medicines and you are losing  sleep.  You develop pain that is getting worse or pain that is not controlled with pain medicines.  You have a fever.  You have unexplained weight loss.  You have night sweats. Get help right away if:  You cough up blood.  You have difficulty breathing.  Your heartbeat is very fast. This information is not intended to replace advice given to you by your health care provider. Make sure you discuss any questions you have with your health care provider. Document Released: 02/27/2011 Document Revised: 02/06/2016 Document Reviewed: 11/07/2014 Elsevier Interactive Patient Education  2017 [employer].   IF you received an x-ray today, you will receive an invoice from [facility]. Please contact [facility] at [phone] with questions or concerns regarding your invoice.   IF you received labwork today, you will receive an invoice from [facility]. Please contact [facility] at [phone] with questions or concerns regarding your invoice.   Our billing staff will not be able to assist you with questions regarding bills from these companies.  You will be contacted with the lab results as soon as they are available. The fastest way to get your results is to activate your My Chart account. Instructions are located on the last page of this paperwork. If you have not heard from [place] regarding the results in 2 weeks, please contact this office.     Upper Respiratory Infection, Adult Most upper respiratory infections (URIs) are a viral infection of the air passages leading to the lungs. A URI affects the nose, throat, and upper air passages. The most common type of URI is nasopharyngitis and is typically referred to as "the common cold." URIs run their course and usually go away on their own. Most of the time, a URI does not require medical attention, but sometimes a bacterial infection in the upper airways can follow a viral infection. This is called a secondary infection. Sinus  and middle ear infections are common types of secondary upper respiratory infections. Bacterial pneumonia can also complicate a URI. A URI can worsen asthma and chronic obstructive pulmonary disease (COPD). Sometimes, these complications can require emergency medical care and may be life threatening. What are the causes? Almost all URIs are caused by viruses. A virus is a type of germ and can spread from one person to another. What increases the risk? You may be at risk for a URI if:  You smoke.  You have chronic heart or lung disease.  You have a weakened defense (immune) system.  You are very young or very old.  You have nasal allergies or asthma.  You work in crowded or poorly ventilated areas.  You work in health care facilities or schools.  What are the signs or symptoms? Symptoms typically develop 2-3 days after you come in contact with a cold virus. Most viral URIs last 7-10 days. However, viral URIs from the influenza  virus (flu virus) can last 14-18 days and are typically more severe. Symptoms may include:  Runny or stuffy (congested) nose.  Sneezing.  Cough.  Sore throat.  Headache.  Fatigue.  Fever.  Loss of appetite.  Pain in your forehead, behind your eyes, and over your cheekbones (sinus pain).  Muscle aches.  How is this diagnosed? Your health care provider may diagnose a URI by:  Physical exam.  Tests to check that your symptoms are not due to another condition such as: ? Strep throat. ? Sinusitis. ? Pneumonia. ? Asthma.  How is this treated? A URI goes away on its own with time. It cannot be cured with medicines, but medicines may be prescribed or recommended to relieve symptoms. Medicines may help:  Reduce your fever.  Reduce your cough.  Relieve nasal congestion.  Follow these instructions at home:  Take medicines only as directed by your health care provider.  Gargle warm saltwater or take cough drops to comfort your throat as  directed by your health care provider.  Use a warm mist humidifier or inhale steam from a shower to increase air moisture. This may make it easier to breathe.  Drink enough fluid to keep your urine clear or pale yellow.  Eat soups and other clear broths and maintain good nutrition.  Rest as needed.  Return to work when your temperature has returned to normal or as your health care provider advises. You may need to stay home longer to avoid infecting others. You can also use a face mask and careful hand washing to prevent spread of the virus.  Increase the usage of your inhaler if you have asthma.  Do not use any tobacco products, including cigarettes, chewing tobacco, or electronic cigarettes. If you need help quitting, ask your health care provider. How is this prevented? The best way to protect yourself from getting a cold is to practice good hygiene.  Avoid oral or hand contact with people with cold symptoms.  Wash your hands often if contact occurs.  There is no clear evidence that vitamin C, vitamin E, echinacea, or exercise reduces the chance of developing a cold. However, it is always recommended to get plenty of rest, exercise, and practice good nutrition. Contact a health care provider if:  You are getting worse rather than better.  Your symptoms are not controlled by medicine.  You have chills.  You have worsening shortness of breath.  You have brown or red mucus.  You have yellow or brown nasal discharge.  You have pain in your face, especially when you bend forward.  You have a fever.  You have swollen neck glands.  You have pain while swallowing.  You have white areas in the back of your throat. Get help right away if:  You have severe or persistent: ? Headache. ? Ear pain. ? Sinus pain. ? Chest pain.  You have chronic lung disease and any of the following: ? Wheezing. ? Prolonged cough. ? Coughing up blood. ? A change in your usual mucus.  You  have a stiff neck.  You have changes in your: ? Vision. ? Hearing. ? Thinking. ? Mood. This information is not intended to replace advice given to you by your health care provider. Make sure you discuss any questions you have with your health care provider. Document Released: 02/24/2001 Document Revised: 05/03/2016 Document Reviewed: 12/06/2013 Elsevier Interactive Patient Education  2017 [employer].      IF you received an x-ray today, you will  receive an Pharmacologist from [facility]. Please contact [facility] at [phone] with questions or concerns regarding your invoice.   IF you received labwork today, you will receive an invoice from [place]. Please contact [facility] at [phone] with questions or concerns regarding your invoice.   Our billing staff will not be able to assist you with questions regarding bills from these companies.  You will be contacted with the lab results as soon as they are available. The fastest way to get your results is to activate your My Chart account. Instructions are located on the last page of this paperwork. If you have not heard from [place] regarding the results in 2 weeks, please contact this office.

## 2017-07-10 NOTE — Progress Notes (Signed)
  MRN: [id number] DOB: 17-Jul-1961  Subjective:   Jesse Europe [name] is a 56 y.o. male presenting for chief complaint of Generalized Body Aches (x 1 week); Cough (producing brown/green sputum); and Sore Throat  Reports 1 week history of chest congestion, sinus congestion, body aches, subjective fever, chills, productive cough, intermittent sore throat and bilateral ear discomfort. Has tried Mucinex DM, Atrovent. Denies chest pain, shob, wheezing, n/v, abdominal pain, rashes. Smokes 1ppd. Denies history of asthma.   Jesse Suarez has a current medication list which includes the following prescription(s): alprazolam, aspirin, carvedilol, clonidine, flecainide, fluoxetine, ipratropium, lisinopril, simvastatin, synthroid, and ventolin hfa. Also is allergic to sulfa drugs cross reactors and codeine.  Jesse Suarez  has a past medical history of Allergy; Anxiety; Atrial fibrillation ([place]); Bell palsy; Depression; Diastolic heart failure; Hypercholesterolemia; Hypertension; Pneumonia; and Thyroid disease. Also  has a past surgical history that includes Cholecystectomy; Knee surgery; Hernia repair; and Vasectomy.  Objective:   Vitals: BP (!) 160/90   Pulse 70   Temp 98.7 F (37.1 C) (Oral)   Resp 18   Ht 5\' 9"  (1.753 m)   Wt 207 lb 12.8 oz (94.3 kg)   SpO2 99%   BMI 30.69 kg/m   BP Readings from Last 3 Encounters:  07/10/17 (!) 160/90  07/05/17 (!) 160/90  06/22/17 112/81    Physical Exam  Constitutional: He is oriented to person, place, and time. He appears well-developed and well-nourished.  HENT:  TM's flat bilaterally but no effusions or erythema. Nasal turbinates erythematous and dry, nasal passages moderately patent. Moderate postnasal drip present but without oropharyngeal exudates, erythema or abscesses.   Neck: Normal range of motion. Neck supple.  Cardiovascular: Normal rate, regular rhythm and intact distal pulses.  Exam reveals no gallop and no friction rub.   No murmur  heard. Pulmonary/Chest: No respiratory distress. He has no wheezes. He has no rales.  Neurological: He is alert and oriented to person, place, and time.   Assessment and Plan :   1. URI with cough and congestion 2. Viral URI 3. Tobacco use disorder - Likely viral in nature, advised supportive care, will use short steroid course, schedule Ventolin, hydrate well. If no improvement or symptoms do not resolve return to clinic in 1 week.   Jaynee Eagles, PA-C [facility] [phone] 07/10/2017  8:30 AM

## 2017-07-12 ENCOUNTER — Encounter: Payer: Self-pay | Admitting: Physician Assistant

## 2017-07-12 ENCOUNTER — Ambulatory Visit (INDEPENDENT_AMBULATORY_CARE_PROVIDER_SITE_OTHER): Payer: 59 | Admitting: Physician Assistant

## 2017-07-12 VITALS — BP 140/80 | HR 62 | Temp 98.7°F | Resp 14 | Ht 69.0 in | Wt 209.0 lb

## 2017-07-12 DIAGNOSIS — J019 Acute sinusitis, unspecified: Secondary | ICD-10-CM | POA: Diagnosis not present

## 2017-07-12 DIAGNOSIS — B9689 Other specified bacterial agents as the cause of diseases classified elsewhere: Secondary | ICD-10-CM | POA: Diagnosis not present

## 2017-07-12 MED ORDER — AMOXICILLIN-POT CLAVULANATE 875-125 MG PO TABS: 1.0000 | ORAL_TABLET | Freq: Two times a day (BID) | ORAL | 0 refills | Status: DC

## 2017-07-12 NOTE — Patient Instructions (Signed)
Please take antibiotic as directed.  Increase fluid intake.  Use Saline nasal spray.  Take a daily multivitamin. Delsym for cough.  Place a humidifier in the bedroom.  Please call or return clinic if symptoms are not improving.  Sinusitis Sinusitis is redness, soreness, and swelling (inflammation) of the paranasal sinuses. Paranasal sinuses are air pockets within the bones of your face (beneath the eyes, the middle of the forehead, or above the eyes). In healthy paranasal sinuses, mucus is able to drain out, and air is able to circulate through them by way of your nose. However, when your paranasal sinuses are inflamed, mucus and air can become trapped. This can allow bacteria and other germs to grow and cause infection. Sinusitis can develop quickly and last only a short time (acute) or continue over a long period (chronic). Sinusitis that lasts for more than 12 weeks is considered chronic.  CAUSES  Causes of sinusitis include:  Allergies.  Structural abnormalities, such as displacement of the cartilage that separates your nostrils (deviated septum), which can decrease the air flow through your nose and sinuses and affect sinus drainage.  Functional abnormalities, such as when the small hairs (cilia) that line your sinuses and help remove mucus do not work properly or are not present. SYMPTOMS  Symptoms of acute and chronic sinusitis are the same. The primary symptoms are pain and pressure around the affected sinuses. Other symptoms include:  Upper toothache.  Earache.  Headache.  Bad breath.  Decreased sense of smell and taste.  A cough, which worsens when you are lying flat.  Fatigue.  Fever.  Thick drainage from your nose, which often is green and may contain pus (purulent).  Swelling and warmth over the affected sinuses. DIAGNOSIS  Your caregiver will perform a physical exam. During the exam, your caregiver may:  Look in your nose for signs of abnormal growths in your  nostrils (nasal polyps).  Tap over the affected sinus to check for signs of infection.  View the inside of your sinuses (endoscopy) with a special imaging device with a light attached (endoscope), which is inserted into your sinuses. If your caregiver suspects that you have chronic sinusitis, one or more of the following tests may be recommended:  Allergy tests.  Nasal culture A sample of mucus is taken from your nose and sent to a lab and screened for bacteria.  Nasal cytology A sample of mucus is taken from your nose and examined by your caregiver to determine if your sinusitis is related to an allergy. TREATMENT  Most cases of acute sinusitis are related to a viral infection and will resolve on their own within 10 days. Sometimes medicines are prescribed to help relieve symptoms (pain medicine, decongestants, nasal steroid sprays, or saline sprays).  However, for sinusitis related to a bacterial infection, your caregiver will prescribe antibiotic medicines. These are medicines that will help kill the bacteria causing the infection.  Rarely, sinusitis is caused by a fungal infection. In theses cases, your caregiver will prescribe antifungal medicine. For some cases of chronic sinusitis, surgery is needed. Generally, these are cases in which sinusitis recurs more than 3 times per year, despite other treatments. HOME CARE INSTRUCTIONS   Drink plenty of water. Water helps thin the mucus so your sinuses can drain more easily.  Use a humidifier.  Inhale steam 3 to 4 times a day (for example, sit in the bathroom with the shower running).  Apply a warm, moist washcloth to your face 3 to 4   times a day, or as directed by your caregiver.  Use saline nasal sprays to help moisten and clean your sinuses.  Take over-the-counter or prescription medicines for pain, discomfort, or fever only as directed by your caregiver. SEEK IMMEDIATE MEDICAL CARE IF:  You have increasing pain or severe  headaches.  You have nausea, vomiting, or drowsiness.  You have swelling around your face.  You have vision problems.  You have a stiff neck.  You have difficulty breathing. MAKE SURE YOU:   Understand these instructions.  Will watch your condition.  Will get help right away if you are not doing well or get worse. Document Released: 08/31/2005 Document Revised: 11/23/2011 Document Reviewed: 09/15/2011 [employer] Patient Information 2014 [employer].   

## 2017-07-12 NOTE — Progress Notes (Signed)
Pre visit review using our clinic review tool, if applicable. No additional management support is needed unless otherwise documented below in the visit note. 

## 2017-07-12 NOTE — Progress Notes (Signed)
Patient presents to clinic today c/o 1 week of sinus pressure/sinus pain, chest congestion and cough productive of green sputum. Denies fever, chills. Denies chest pain or SOB. Has noted some facial pain. Was seen at [facility] and given cough syrup and prednisone. Is unable to tolerate Rx cough syrup as they make him feel jittery.   Past Medical History:  Diagnosis Date  . Allergy   . Anxiety   . Atrial fibrillation ([place])   . Bell palsy   . Depression   . Diastolic heart failure   . Hypercholesterolemia   . Hypertension   . Pneumonia   . Thyroid disease     Current Outpatient Prescriptions on File Prior to Visit  Medication Sig Dispense Refill  . ALPRAZolam (XANAX) 0.5 MG tablet TAKE 1 TABLET BY MOUTH 3 TIMES A DAY 90 tablet 1  . aspirin 81 MG tablet Take 81 mg by mouth every other day.     . carvedilol (COREG) 12.5 MG tablet Take 1.5 tablets (18.75 mg total) by mouth 2 (two) times daily. 270 tablet 3  . cloNIDine (CATAPRES-TTS-1) 0.1 mg/24hr patch Place 1 patch (0.1 mg total) onto the skin once a week. 4 patch 12  . flecainide (TAMBOCOR) 100 MG tablet TAKE 1 TABLET BY MOUTH TWICE A DAY 60 tablet 11  . FLUoxetine (PROZAC) 40 MG capsule TAKE 2 CAPSULES BY MOUTH DAILY 180 capsule 1  . ipratropium (ATROVENT) 0.03 % nasal spray USE 2 SPRAYS INTO BOTH NOSTRIL 3 TIMES DAILY 30 mL 3  . lisinopril (PRINIVIL,ZESTRIL) 40 MG tablet Take 1 tablet (40 mg total) by mouth daily. 30 tablet 3  . predniSONE (DELTASONE) 20 MG tablet Take 2 tablets daily with breakfast. 10 tablet 0  . simvastatin (ZOCOR) 40 MG tablet TAKE 1 TABLET BY MOUTH AT BEDTIME. 90 tablet 3  . SYNTHROID 137 MCG tablet Take 1 tablet (137 mcg total) by mouth daily before breakfast. 30 tablet 3  . VENTOLIN HFA 108 (90 Base) MCG/ACT inhaler INHALE 2 PUFFS INTO THE LUNGS EVERY 6 HOURS AS NEEDED FOR WHEEZING/SHORTNESS OF BREATH 18 Inhaler 5  . benzonatate (TESSALON) 100 MG capsule Take 1-2 capsules (100-200 mg total) by mouth 3 (three) times  daily as needed. (Patient not taking: Reported on 07/12/2017) 60 capsule 0  . HYDROcodone-homatropine (HYCODAN) 5-1.5 MG/5ML syrup Take 5 mLs by mouth at bedtime as needed. (Patient not taking: Reported on 07/12/2017) 100 mL 0   No current facility-administered medications on file prior to visit.     Allergies  Allergen Reactions  . Sulfa Drugs Cross Reactors Hives  . Codeine Other (See Comments)    Made him very jittery     Family History  Problem Relation Age of Onset  . Heart disease Mother   . Heart attack Mother   . Other Mother        thyroid problems  . Kidney cancer Mother   . Cirrhosis Father   . Other Father        thyroid problems  . Alcohol abuse Father   . Hypertension Father   . Mental illness Brother   . Parkinsonism Brother     Social History   Social History  . Marital status: Married    Spouse name: N/A  . Number of children: 1  . Years of education: N/A   Social History Main Topics  . Smoking status: Current Every Day Smoker    Packs/day: 1.00    Years: 30.00    Types: Cigarettes  .  Smokeless tobacco: Never Used  . Alcohol use 0.0 oz/week     Comment: rarely  . Drug use: No  . Sexual activity: No   Other Topics Concern  . 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  All other ROS are negative.  BP 140/80   Pulse 62   Temp 98.7 F (37.1 C) (Oral)   Resp 14   Ht 5\' 9"  (1.753 m)   Wt 209 lb (94.8 kg)   SpO2 98%   BMI 30.86 kg/m   Physical Exam  Constitutional: He is oriented to person, place, and time and well-developed, well-nourished, and in no distress.  HENT:  Head: Normocephalic and atraumatic.  Right Ear:  Tympanic membrane normal.  Left Ear: Tympanic membrane normal.  Nose: Mucosal edema and rhinorrhea present. Right sinus exhibits maxillary sinus tenderness. Left sinus exhibits maxillary sinus tenderness.  Mouth/Throat: Uvula is midline, oropharynx is clear and moist and mucous membranes are normal.  Eyes: Conjunctivae are normal.  Neck: Neck supple.  Cardiovascular: Normal rate, regular rhythm, normal heart sounds and intact distal pulses.   Pulmonary/Chest: Effort normal and breath sounds normal. No respiratory distress. He has no wheezes. He has no rales. He exhibits no tenderness.  Lymphadenopathy:    He has no cervical adenopathy.  Neurological: He is alert and oriented to person, place, and time. No cranial nerve deficit.  Skin: Skin is warm and dry. No rash noted.  Psychiatric: Affect normal.  Vitals reviewed.  Recent Results (from the past 2160 hour(s))  TSH     Status: None   Collection Time: 05/20/17  4:06 PM  Result Value Ref Range   TSH 3.07 0.35 - 4.50 uIU/mL  T4, free     Status: None   Collection Time: 05/20/17  4:06 PM  Result Value Ref Range   Free T4 0.97 0.60 - 1.60 ng/dL    Comment: Specimens from patients who are undergoing biotin therapy and /or ingesting biotin supplements may contain high levels of biotin.  The higher biotin concentration in these specimens interferes with this Free T4 assay.  Specimens that contain high levels  of biotin may cause false high results for this Free T4 assay.  Please interpret results in light of the total clinical presentation of the patient.    CBC with Differential/Platelet     Status: Abnormal   Collection Time: 06/22/17 11:28 AM  Result Value Ref Range   WBC 17.2 (H) 4.0 - 10.5 K/uL   RBC 4.87 4.22 - 5.81 MIL/uL   Hemoglobin 15.5 13.0 - 17.0 g/dL   HCT 44.0 39.0 - 52.0 %   MCV 90.3 78.0 - 100.0 fL   MCH 31.8 26.0 - 34.0 pg   MCHC 35.2 30.0 - 36.0 g/dL   RDW 13.1 11.5 - 15.5 %   Platelets 248 150 - 400 K/uL    Neutrophils Relative % 80 %   Lymphocytes Relative 11 %   Monocytes Relative 8 %   Eosinophils Relative  1 %   Basophils Relative 0 %   Neutro Abs 13.7 (H) 1.7 - 7.7 K/uL   Lymphs Abs 1.9 0.7 - 4.0 K/uL   Monocytes Absolute 1.4 (H) 0.1 - 1.0 K/uL   Eosinophils Absolute 0.2 0.0 - 0.7 K/uL   Basophils Absolute 0.0 0.0 - 0.1 K/uL   Smear Review MORPHOLOGY UNREMARKABLE   Protime-INR     Status: None   Collection Time: 06/22/17 11:28 AM  Result Value Ref Range   Prothrombin Time 13.3 11.4 - 15.2 seconds   INR 3.53   Basic metabolic panel     Status: Abnormal   Collection Time: 07/02/17  3:32 PM  Result Value Ref Range   Sodium 124 (L) 135 - 145 mEq/L   Potassium 4.0 3.5 - 5.1 mEq/L   Chloride 88 (L) 96 - 112 mEq/L   CO2 29 19 - 32 mEq/L   Glucose, Bld 81 70 - 99 mg/dL   BUN 8 6 - 23 mg/dL   Creatinine, Ser 0.98 0.40 - 1.50 mg/dL   Calcium 9.2 8.4 - 10.5 mg/dL   GFR 83.33 >60.00 mL/min  TSH     Status: Abnormal   Collection Time: 07/02/17  3:32 PM  Result Value Ref Range   TSH 10.28 (H) 0.35 - 4.50 uIU/mL    Assessment/Plan: 1. Acute bacterial sinusitis Rx Augmentin.  Increase fluids.  Rest.  Saline nasal spray.  Probiotic.  Plain Mucinex as directed.  Humidifier in bedroom. Delsym for cough.  Call or return to clinic if symptoms are not improving.  - amoxicillin-clavulanate (AUGMENTIN) 875-125 MG tablet; Take 1 tablet by mouth 2 (two) times daily.  Dispense: 14 tablet; Refill: 0   Leeanne Rio, [place]

## 2017-07-14 ENCOUNTER — Other Ambulatory Visit: Payer: Self-pay | Admitting: Physician Assistant

## 2017-07-15 NOTE — Telephone Encounter (Signed)
Rx faxed to the [facility] pharmacy

## 2017-07-15 NOTE — Telephone Encounter (Signed)
Last Xanax on 05/20/17 #901 RF [facility]: 01/14/17 UDS: 01/15/17 low risk  Please advise

## 2017-07-21 ENCOUNTER — Other Ambulatory Visit: Payer: Self-pay | Admitting: Endocrinology

## 2017-08-12 ENCOUNTER — Other Ambulatory Visit (INDEPENDENT_AMBULATORY_CARE_PROVIDER_SITE_OTHER): Payer: 59

## 2017-08-12 DIAGNOSIS — I10 Essential (primary) hypertension: Secondary | ICD-10-CM

## 2017-08-12 LAB — BASIC METABOLIC PANEL
BUN: 10 mg/dL (ref 6–23)
CO2: 27 mEq/L (ref 19–32)
Calcium: 9.1 mg/dL (ref 8.4–10.5)
Chloride: 92 mEq/L — ABNORMAL LOW (ref 96–112)
Creatinine, Ser: 0.88 mg/dL (ref 0.40–1.50)
GFR: 94.32 mL/min (ref >60.00)
Glucose, Bld: 93 mg/dL (ref 70–99)
Potassium: 4.2 mEq/L (ref 3.5–5.1)
Sodium: 125 mEq/L — ABNORMAL LOW (ref 135–145)

## 2017-08-12 LAB — T4, FREE: Free T4: 1.02 ng/dL (ref 0.60–1.60)

## 2017-08-12 LAB — TSH: TSH: 4.77 u[IU]/mL — ABNORMAL HIGH (ref 0.35–4.50)

## 2017-08-16 ENCOUNTER — Ambulatory Visit (INDEPENDENT_AMBULATORY_CARE_PROVIDER_SITE_OTHER): Payer: 59 | Admitting: Endocrinology

## 2017-08-16 ENCOUNTER — Encounter: Payer: Self-pay | Admitting: Endocrinology

## 2017-08-16 VITALS — BP 124/74 | HR 58 | Ht 69.0 in | Wt 212.0 lb

## 2017-08-16 DIAGNOSIS — E890 Postprocedural hypothyroidism: Secondary | ICD-10-CM | POA: Diagnosis not present

## 2017-08-16 DIAGNOSIS — I10 Essential (primary) hypertension: Secondary | ICD-10-CM | POA: Diagnosis not present

## 2017-08-16 NOTE — Progress Notes (Signed)
Patient ID: Jesse Europe [name], male   DOB: June 22, 1961, 56 y.o.   MRN: [id number]    Reason for Appointment:  Hypothyroidism and hypertension, followup visit    History of Present Illness:   The hypothyroidism was first diagnosed in 11/13. Previously had I-131 treatment for Graves' disease He was initially started with 88 mcg but his dose needed to be increased progressively  Complaints are reported by the patient now are none, no complaints of fatigue Still has no complaints of shakiness, sweating or palpitations  He has been treated with brand name SYNTHROID, the dose has been adjusted periodically based on his labs He is now taking 137 g and the dose was increased in October   He says he has not missed his levothyroxine supplement and usually takes it before breakfast at 8 AM  He thinks his energy level is improving since his last visit Not clear why he has gained little weight His TSH is improved but still slightly above normal at 4.8   Wt Readings from Last 3 Encounters:  08/16/17 212 lb (96.2 kg)  07/12/17 209 lb (94.8 kg)  07/10/17 207 lb 12.8 oz (94.3 kg)    LABS:    Lab Results  Component Value Date   TSH 4.77 (H) 08/12/2017   TSH 10.28 (H) 07/02/2017   TSH 3.07 05/20/2017   FREET4 1.02 08/12/2017   FREET4 0.97 05/20/2017   FREET4 1.13 03/23/2017     HYPERTENSION management: See review of systems    Allergies as of 08/16/2017      Reactions   Sulfa Drugs Cross Reactors Hives   Codeine Other (See Comments)   Made him very jittery       Medication List        Accurate as of 08/16/17  3:58 PM. Always use your most recent med list.          ALPRAZolam 0.5 MG tablet Commonly known as:  XANAX TAKE 1 TABLET BY MOUTH 3 TIMES A DAY   aspirin 81 MG tablet Take 81 mg by mouth every other day.   carvedilol 12.5 MG tablet Commonly known as:  COREG Take 1.5 tablets (18.75 mg total) by mouth 2 (two) times daily.   cloNIDine 0.1 mg/24hr  patch Commonly known as:  CATAPRES-TTS-1 Place 1 patch (0.1 mg total) onto the skin once a week.   flecainide 100 MG tablet Commonly known as:  TAMBOCOR TAKE 1 TABLET BY MOUTH TWICE A DAY   FLUoxetine 40 MG capsule Commonly known as:  PROZAC TAKE 2 CAPSULES BY MOUTH DAILY   hydrochlorothiazide 12.5 MG capsule Commonly known as:  MICROZIDE TAKE 1 CAPSULE BY MOUTH EVERY DAY   ipratropium 0.03 % nasal spray Commonly known as:  ATROVENT USE 2 SPRAYS INTO BOTH NOSTRIL 3 TIMES DAILY   lisinopril 40 MG tablet Commonly known as:  PRINIVIL,ZESTRIL Take 1 tablet (40 mg total) by mouth daily.   simvastatin 40 MG tablet Commonly known as:  ZOCOR TAKE 1 TABLET BY MOUTH AT BEDTIME.   SYNTHROID 137 MCG tablet Generic drug:  levothyroxine Take 1 tablet (137 mcg total) by mouth daily before breakfast.   VENTOLIN HFA 108 (90 Base) MCG/ACT inhaler Generic drug:  albuterol INHALE 2 PUFFS INTO THE LUNGS EVERY 6 HOURS AS NEEDED FOR WHEEZING/SHORTNESS OF BREATH       Past Medical History:  Diagnosis Date  . Allergy   . Anxiety   . Atrial fibrillation ([place])   . Bell palsy   .  Depression   . Diastolic heart failure   . Hypercholesterolemia   . Hypertension   . Pneumonia   . Thyroid disease     Past Surgical History:  Procedure Laterality Date  . CHOLECYSTECTOMY    . HERNIA REPAIR    . KNEE SURGERY    . VASECTOMY      Family History  Problem Relation Age of Onset  . Heart disease Mother   . Heart attack Mother   . Other Mother        thyroid problems  . Kidney cancer Mother   . Cirrhosis Father   . Other Father        thyroid problems  . Alcohol abuse Father   . Hypertension Father   . Mental illness Brother   . Parkinsonism Brother     Social History:  reports that he has been smoking cigarettes.  He has a 30.00 pack-year smoking history. he has never used smokeless tobacco. He reports that he drinks alcohol. He reports that he does not use drugs.  Allergies:    Allergies  Allergen Reactions  . Sulfa Drugs Cross Reactors Hives  . Codeine Other (See Comments)    Made him very jittery     REVIEW of systems:   HYPERTENSION: He has had long-standing hypertension which is  managed with lisinopril 40 and Coreg 1-1/2 tablets twice a day  When he was seen for his hypothyroidism and his blood pressure was significantly high and he was given HCTZ. However with this his sodium was down to 124 and he was told to stop this Subsequently was started on Catapres 0.1 mg patch on his visit in October  He thinks his blood pressure is much better at home Otherwise he thinks his blood pressure has been sometimes low normal below 110, using a [facility] monitor  As expected his sodium is not improved   BP Readings from Last 3 Encounters:  08/16/17 124/74  07/12/17 140/80  07/10/17 (!) 160/90    Lab Results  Component Value Date   NA 125 (L) 08/12/2017   K 4.2 08/12/2017   CL 92 (L) 08/12/2017   CO2 27 08/12/2017       Examination:   BP 124/74   Pulse (!) 58   Ht 5\' 9"  (1.753 m)   Wt 212 lb (96.2 kg)   SpO2 95%   BMI 31.31 kg/m        Assessments   Hypothyroidism, post I-131 ablation  He appears to be requiring larger doses of supplement this year Not taking 137 g and his TSH is still slightly above normal Subjectively he feels better with the dosage increased   For now he will increase the dose by  half  tablet weekly and  follow-up in 2 months   HYPERTENSION:  His blood pressure is much better controlled with using clonidine patch which he says is not causing side effects He was told to stop HCTZ but he did not do so This is again causing hyponatremia and may be causing some of his fatigue  He will stop HCTZ but continue the carvedilol and lisinopril unchanged Follow-up in 2 months He will bring his pressure monitor for comparison on his next visit   , 08/16/2017, 3:58 PM

## 2017-08-16 NOTE — Patient Instructions (Signed)
Take extra 1/2 pill once a week of Thyroid  STOP HCTZ

## 2017-08-17 ENCOUNTER — Other Ambulatory Visit: Payer: Self-pay | Admitting: Emergency Medicine

## 2017-08-17 DIAGNOSIS — I10 Essential (primary) hypertension: Secondary | ICD-10-CM

## 2017-08-17 MED ORDER — LISINOPRIL 40 MG PO TABS: 40.0000 mg | ORAL_TABLET | Freq: Every day | ORAL | 1 refills | Status: DC

## 2017-08-17 MED ORDER — IPRATROPIUM BROMIDE 0.03 % NA SOLN: NASAL | 3 refills | Status: DC

## 2017-08-20 ENCOUNTER — Other Ambulatory Visit: Payer: Self-pay | Admitting: Physician Assistant

## 2017-08-20 DIAGNOSIS — E785 Hyperlipidemia, unspecified: Secondary | ICD-10-CM

## 2017-08-20 NOTE — Telephone Encounter (Signed)
Sent in 1 time refill of requested medication. He is due for repeat fasting lipid panel prior to further refills.

## 2017-08-25 ENCOUNTER — Other Ambulatory Visit: Payer: Self-pay

## 2017-08-25 MED ORDER — SYNTHROID 137 MCG PO TABS: 137.0000 ug | ORAL_TABLET | Freq: Every day | ORAL | 3 refills | Status: DC

## 2017-09-08 ENCOUNTER — Other Ambulatory Visit: Payer: Self-pay

## 2017-09-09 ENCOUNTER — Other Ambulatory Visit: Payer: Self-pay | Admitting: Physician Assistant

## 2017-09-10 NOTE — Telephone Encounter (Signed)
Refill request for Xanax last filled 07/15/17 #90 1 RF [facility]:01/14/17 UDS:01/15/17 Please advise in PCP absence

## 2017-09-13 ENCOUNTER — Other Ambulatory Visit: Payer: 59

## 2017-09-16 ENCOUNTER — Ambulatory Visit: Payer: 59 | Admitting: Endocrinology

## 2017-09-24 ENCOUNTER — Other Ambulatory Visit: Payer: Self-pay | Admitting: Physician Assistant

## 2017-09-28 ENCOUNTER — Other Ambulatory Visit: Payer: Self-pay | Admitting: Physician Assistant

## 2017-09-28 DIAGNOSIS — I10 Essential (primary) hypertension: Secondary | ICD-10-CM

## 2017-10-15 ENCOUNTER — Other Ambulatory Visit (INDEPENDENT_AMBULATORY_CARE_PROVIDER_SITE_OTHER): Payer: 59

## 2017-10-15 DIAGNOSIS — E890 Postprocedural hypothyroidism: Secondary | ICD-10-CM

## 2017-10-15 DIAGNOSIS — I10 Essential (primary) hypertension: Secondary | ICD-10-CM | POA: Diagnosis not present

## 2017-10-15 LAB — BASIC METABOLIC PANEL
BUN: 8 mg/dL (ref 6–23)
CO2: 30 mEq/L (ref 19–32)
Calcium: 8.9 mg/dL (ref 8.4–10.5)
Chloride: 97 mEq/L (ref 96–112)
Creatinine, Ser: 0.8 mg/dL (ref 0.40–1.50)
GFR: 105.22 mL/min (ref >60.00)
Glucose, Bld: 82 mg/dL (ref 70–99)
Potassium: 4 mEq/L (ref 3.5–5.1)
Sodium: 131 mEq/L — ABNORMAL LOW (ref 135–145)

## 2017-10-15 LAB — T4, FREE: Free T4: 0.97 ng/dL (ref 0.60–1.60)

## 2017-10-15 LAB — TSH: TSH: 6.48 u[IU]/mL — ABNORMAL HIGH (ref 0.35–4.50)

## 2017-10-20 ENCOUNTER — Encounter: Payer: Self-pay | Admitting: Endocrinology

## 2017-10-20 ENCOUNTER — Ambulatory Visit (INDEPENDENT_AMBULATORY_CARE_PROVIDER_SITE_OTHER): Payer: 59 | Admitting: Endocrinology

## 2017-10-20 VITALS — BP 142/90 | HR 57 | Ht 69.0 in | Wt 215.2 lb

## 2017-10-20 DIAGNOSIS — I10 Essential (primary) hypertension: Secondary | ICD-10-CM | POA: Diagnosis not present

## 2017-10-20 DIAGNOSIS — E890 Postprocedural hypothyroidism: Secondary | ICD-10-CM | POA: Diagnosis not present

## 2017-10-20 MED ORDER — SYNTHROID 175 MCG PO TABS: 175.0000 ug | ORAL_TABLET | Freq: Every day | ORAL | 3 refills | Status: DC

## 2017-10-20 NOTE — Progress Notes (Signed)
Patient ID: Jesse Europe [name], male   DOB: Jul 08, 1961, 57 y.o.   MRN: [id number]    Reason for Appointment:  Hypothyroidism and hypertension, followup visit    History of Present Illness:   The hypothyroidism was first diagnosed in 11/13. Previously had I-131 treatment for Graves' disease He was initially started with 88 mcg but his dose needed to be increased progressively  Complaints are reported by the patient now are none, no complaints of fatigue Still has no complaints of shakiness, sweating or palpitations  He has been treated with brand name SYNTHROID, the dose has been adjusted periodically based on his labs He is now taking 137 g, 8 tablets a week and the dose was increased in October and again slightly in December  He usually takes it before breakfast at 8 AM  He thinks his energy level is not bad but still gets tired, not clear if this is worse  Again has gained weight  His TSH has gone up to 6.5    Wt Readings from Last 3 Encounters:  10/20/17 215 lb 3.2 oz (97.6 kg)  08/16/17 212 lb (96.2 kg)  07/12/17 209 lb (94.8 kg)    LABS:    Lab Results  Component Value Date   TSH 6.48 (H) 10/15/2017   TSH 4.77 (H) 08/12/2017   TSH 10.28 (H) 07/02/2017   FREET4 0.97 10/15/2017   FREET4 1.02 08/12/2017   FREET4 0.97 05/20/2017     HYPERTENSION management: See review of systems    Allergies as of 10/20/2017      Reactions   Sulfa Drugs Cross Reactors Hives   Codeine Other (See Comments)   Made him very jittery       Medication List        Accurate as of 10/20/17  3:53 PM. Always use your most recent med list.          albuterol 108 (90 Base) MCG/ACT inhaler Commonly known as:  VENTOLIN HFA INHALE 2 PUFFS INTO THE LUNGS EVERY 6 HOURS AS NEEDED FOR WHEEZING/SHORTNESS OF BREATH   ALPRAZolam 0.5 MG tablet Commonly known as:  XANAX TAKE 1 TABLET 3 TIMES DAILY   aspirin 81 MG tablet Take 81 mg by mouth every other day.   carvedilol  12.5 MG tablet Commonly known as:  COREG Take 1.5 tablets (18.75 mg total) by mouth 2 (two) times daily.   cloNIDine 0.1 mg/24hr patch Commonly known as:  CATAPRES-TTS-1 Place 1 patch (0.1 mg total) onto the skin once a week.   flecainide 100 MG tablet Commonly known as:  TAMBOCOR TAKE 1 TABLET BY MOUTH TWICE A DAY   FLUoxetine 40 MG capsule Commonly known as:  PROZAC TAKE 2 CAPSULES BY MOUTH DAILY   ipratropium 0.03 % nasal spray Commonly known as:  ATROVENT USE 2 SPRAYS INTO BOTH NOSTRIL 3 TIMES DAILY   lisinopril 40 MG tablet Commonly known as:  PRINIVIL,ZESTRIL Take 1 tablet (40 mg total) by mouth daily.   simvastatin 40 MG tablet Commonly known as:  ZOCOR TAKE 1 TABLET BY MOUTH AT BEDTIME.   SYNTHROID 137 MCG tablet Generic drug:  levothyroxine Take 1 tablet (137 mcg total) by mouth daily before breakfast.       Past Medical History:  Diagnosis Date  . Allergy   . Anxiety   . Atrial fibrillation ([place])   . Bell palsy   . Depression   . Diastolic heart failure   . Hypercholesterolemia   . Hypertension   .  Pneumonia   . Thyroid disease     Past Surgical History:  Procedure Laterality Date  . CHOLECYSTECTOMY    . HERNIA REPAIR    . KNEE SURGERY    . VASECTOMY      Family History  Problem Relation Age of Onset  . Heart disease Mother   . Heart attack Mother   . Other Mother        thyroid problems  . Kidney cancer Mother   . Cirrhosis Father   . Other Father        thyroid problems  . Alcohol abuse Father   . Hypertension Father   . Mental illness Brother   . Parkinsonism Brother     Social History:  reports that he has been smoking cigarettes.  He has a 30.00 pack-year smoking history. he has never used smokeless tobacco. He reports that he drinks alcohol. He reports that he does not use drugs.  Allergies:  Allergies  Allergen Reactions  . Sulfa Drugs Cross Reactors Hives  . Codeine Other (See Comments)    Made him very jittery      REVIEW of systems:   HYPERTENSION: He has had long-standing hypertension which is  managed with lisinopril 40 and Coreg 1-1/2 tablets twice a day He is also not taking hydrochlorothiazide which was causing hyponatremia  Although his blood pressure appears to be better on the last 2 measurements it is higher today and he thinks this is from rushing and also not getting enough sleep with his long work hours  He thinks his blood pressure is better at home Otherwise he thinks his blood pressure has been 110-120, using a [facility] monitor but has not brought this for comparison   BP Readings from Last 3 Encounters:  10/20/17 (!) 144/96  08/16/17 124/74  07/12/17 140/80   HYPONATREMIA: His sodium is improved but still not back to normal  Lab Results  Component Value Date   NA 131 (L) 10/15/2017   K 4.0 10/15/2017   CL 97 10/15/2017   CO2 30 10/15/2017       Examination:   BP (!) 144/96 (BP Location: Left Arm, Patient Position: Sitting, Cuff Size: Normal)   Pulse (!) 57   Ht 5\' 9"  (1.753 m)   Wt 215 lb 3.2 oz (97.6 kg)   SpO2 94%   BMI 31.78 kg/m        Assessments   Hypothyroidism, post I-131 ablation  He appears to be requiring larger doses of supplement progressively  He was told to take A half tablet weekly of the 137 Synthroid but he thinks he is taking an extra tablet and Tuesdays His TSH is gone up to over 6  Recommended that we start him on SYNTHROID 175 now  HYPONATREMIA: This is mild with sodium 131 and may be related to SIADH from taking Prozac, asymptomatic currently and will continue to follow  HYPERTENSION:  His blood pressure is higher although it had been better controlled with adding clonidine patch He thinks his blood pressure is higher because of stress and not getting enough rest but need to confirm with a follow-up reading  Follow-up in 6 weeks He will bring his pressure monitor for comparison on his next visit   [name] 10/20/2017, 3:53  PM

## 2017-10-29 ENCOUNTER — Ambulatory Visit: Payer: 59 | Admitting: Sports Medicine

## 2017-10-29 DIAGNOSIS — M25511 Pain in right shoulder: Secondary | ICD-10-CM | POA: Diagnosis not present

## 2017-11-01 ENCOUNTER — Ambulatory Visit: Payer: 59 | Admitting: Sports Medicine

## 2017-11-10 DIAGNOSIS — M25511 Pain in right shoulder: Secondary | ICD-10-CM | POA: Diagnosis not present

## 2017-11-11 ENCOUNTER — Other Ambulatory Visit: Payer: Self-pay | Admitting: Emergency Medicine

## 2017-11-11 ENCOUNTER — Encounter: Payer: Self-pay | Admitting: Emergency Medicine

## 2017-11-11 DIAGNOSIS — E785 Hyperlipidemia, unspecified: Secondary | ICD-10-CM

## 2017-11-11 MED ORDER — SIMVASTATIN 40 MG PO TABS: 40.0000 mg | ORAL_TABLET | Freq: Every day | ORAL | 0 refills | Status: DC

## 2017-11-22 ENCOUNTER — Ambulatory Visit: Payer: 59 | Admitting: Physician Assistant

## 2017-11-22 ENCOUNTER — Encounter: Payer: Self-pay | Admitting: Physician Assistant

## 2017-11-22 ENCOUNTER — Other Ambulatory Visit: Payer: Self-pay

## 2017-11-22 VITALS — BP 150/98 | HR 66 | Temp 98.1°F | Resp 16 | Ht 69.0 in | Wt 207.0 lb

## 2017-11-22 DIAGNOSIS — J0100 Acute maxillary sinusitis, unspecified: Secondary | ICD-10-CM | POA: Diagnosis not present

## 2017-11-22 LAB — POC INFLUENZA A&B (BINAX/QUICKVUE)
Influenza A, POC: NEGATIVE
Influenza B, POC: NEGATIVE

## 2017-11-22 MED ORDER — AMOXICILLIN-POT CLAVULANATE 875-125 MG PO TABS: 1.0000 | ORAL_TABLET | Freq: Two times a day (BID) | ORAL | 0 refills | Status: DC

## 2017-11-22 MED ORDER — BENZONATATE 100 MG PO CAPS: 100.0000 mg | ORAL_CAPSULE | Freq: Two times a day (BID) | ORAL | 0 refills | Status: DC | PRN

## 2017-11-22 NOTE — Patient Instructions (Signed)
Please take antibiotic as directed.  Increase fluid intake.  Use Saline nasal spray.  Take a daily multivitamin. Take the Tessalon as directed for cough. Avoid decongestants. You can take plain Mucinex.  Place a humidifier in the bedroom.  Please call or return clinic if symptoms are not improving.  Sinusitis Sinusitis is redness, soreness, and swelling (inflammation) of the paranasal sinuses. Paranasal sinuses are air pockets within the bones of your face (beneath the eyes, the middle of the forehead, or above the eyes). In healthy paranasal sinuses, mucus is able to drain out, and air is able to circulate through them by way of your nose. However, when your paranasal sinuses are inflamed, mucus and air can become trapped. This can allow bacteria and other germs to grow and cause infection. Sinusitis can develop quickly and last only a short time (acute) or continue over a long period (chronic). Sinusitis that lasts for more than 12 weeks is considered chronic.  CAUSES  Causes of sinusitis include:  Allergies.  Structural abnormalities, such as displacement of the cartilage that separates your nostrils (deviated septum), which can decrease the air flow through your nose and sinuses and affect sinus drainage.  Functional abnormalities, such as when the small hairs (cilia) that line your sinuses and help remove mucus do not work properly or are not present. SYMPTOMS  Symptoms of acute and chronic sinusitis are the same. The primary symptoms are pain and pressure around the affected sinuses. Other symptoms include:  Upper toothache.  Earache.  Headache.  Bad breath.  Decreased sense of smell and taste.  A cough, which worsens when you are lying flat.  Fatigue.  Fever.  Thick drainage from your nose, which often is green and may contain pus (purulent).  Swelling and warmth over the affected sinuses. DIAGNOSIS  Your caregiver will perform a physical exam. During the exam, your  caregiver may:  Look in your nose for signs of abnormal growths in your nostrils (nasal polyps).  Tap over the affected sinus to check for signs of infection.  View the inside of your sinuses (endoscopy) with a special imaging device with a light attached (endoscope), which is inserted into your sinuses. If your caregiver suspects that you have chronic sinusitis, one or more of the following tests may be recommended:  Allergy tests.  Nasal culture A sample of mucus is taken from your nose and sent to a lab and screened for bacteria.  Nasal cytology A sample of mucus is taken from your nose and examined by your caregiver to determine if your sinusitis is related to an allergy. TREATMENT  Most cases of acute sinusitis are related to a viral infection and will resolve on their own within 10 days. Sometimes medicines are prescribed to help relieve symptoms (pain medicine, decongestants, nasal steroid sprays, or saline sprays).  However, for sinusitis related to a bacterial infection, your caregiver will prescribe antibiotic medicines. These are medicines that will help kill the bacteria causing the infection.  Rarely, sinusitis is caused by a fungal infection. In theses cases, your caregiver will prescribe antifungal medicine. For some cases of chronic sinusitis, surgery is needed. Generally, these are cases in which sinusitis recurs more than 3 times per year, despite other treatments. HOME CARE INSTRUCTIONS   Drink plenty of water. Water helps thin the mucus so your sinuses can drain more easily.  Use a humidifier.  Inhale steam 3 to 4 times a day (for example, sit in the bathroom with the shower running).  Apply a warm, moist washcloth to your face 3 to 4 times a day, or as directed by your caregiver.  Use saline nasal sprays to help moisten and clean your sinuses.  Take over-the-counter or prescription medicines for pain, discomfort, or fever only as directed by your caregiver. SEEK  IMMEDIATE MEDICAL CARE IF:  You have increasing pain or severe headaches.  You have nausea, vomiting, or drowsiness.  You have swelling around your face.  You have vision problems.  You have a stiff neck.  You have difficulty breathing. MAKE SURE YOU:   Understand these instructions.  Will watch your condition.  Will get help right away if you are not doing well or get worse. Document Released: 08/31/2005 Document Revised: 11/23/2011 Document Reviewed: 09/15/2011 [facility] Patient Information 2014 [place], [place].

## 2017-11-22 NOTE — Progress Notes (Signed)
Patient presents to clinic today c/o 4 days of chills of progressively worsening chest congestion and cough productive of green sputum. Notes sinus pressure and pain. Notes intermittent ear pressure/pain. Denies chest pain, SOB. Is a current smoker. Denies recent travel or sick contact.   Past Medical History:  Diagnosis Date  . Allergy   . Anxiety   . Atrial fibrillation ([place])   . Bell palsy   . Depression   . Diastolic heart failure   . Hypercholesterolemia   . Hypertension   . Pneumonia   . Thyroid disease     Current Outpatient Medications on File Prior to Visit  Medication Sig Dispense Refill  . albuterol (VENTOLIN HFA) 108 (90 Base) MCG/ACT inhaler INHALE 2 PUFFS INTO THE LUNGS EVERY 6 HOURS AS NEEDED FOR WHEEZING/SHORTNESS OF BREATH 18 Inhaler 5  . ALPRAZolam (XANAX) 0.5 MG tablet TAKE 1 TABLET 3 TIMES DAILY 90 tablet 2  . aspirin 81 MG tablet Take 81 mg by mouth every other day.     . carvedilol (COREG) 12.5 MG tablet Take 1.5 tablets (18.75 mg total) by mouth 2 (two) times daily. 270 tablet 3  . cloNIDine (CATAPRES-TTS-1) 0.1 mg/24hr patch Place 1 patch (0.1 mg total) onto the skin once a week. 4 patch 12  . flecainide (TAMBOCOR) 100 MG tablet TAKE 1 TABLET BY MOUTH TWICE A DAY 60 tablet 11  . FLUoxetine (PROZAC) 40 MG capsule TAKE 2 CAPSULES BY MOUTH DAILY 180 capsule 1  . ipratropium (ATROVENT) 0.03 % nasal spray USE 2 SPRAYS INTO BOTH NOSTRIL 3 TIMES DAILY 90 mL 3  . lisinopril (PRINIVIL,ZESTRIL) 40 MG tablet Take 1 tablet (40 mg total) by mouth daily. 90 tablet 1  . simvastatin (ZOCOR) 40 MG tablet Take 1 tablet (40 mg total) by mouth at bedtime. 30 tablet 0  . SYNTHROID 175 MCG tablet Take 1 tablet (175 mcg total) by mouth daily before breakfast. 30 tablet 3   No current facility-administered medications on file prior to visit.     Allergies  Allergen Reactions  . Sulfa Drugs Cross Reactors Hives  . Codeine Other (See Comments)    Made him very jittery      Family History  Problem Relation Age of Onset  . Heart disease Mother   . Heart attack Mother   . Other Mother        thyroid problems  . Kidney cancer Mother   . Cirrhosis Father   . Other Father        thyroid problems  . Alcohol abuse Father   . Hypertension Father   . Mental illness Brother   . Parkinsonism Brother     Social History   Socioeconomic History  . Marital status: Married    Spouse name: None  . Number of children: 1  . Years of education: None  . Highest education level: None  Social Needs  . Financial resource strain: None  . Food insecurity - worry: None  . Food insecurity - inability: None  . Transportation needs - medical: None  . Transportation needs - non-medical: None  Occupational History  . None  Tobacco Use  . Smoking status: Current Every Day Smoker    Packs/day: 1.00    Years: 30.00    Pack years: 30.00    Types: Cigarettes  . Smokeless tobacco: Never Used  Substance and Sexual Activity  . Alcohol use: Yes    Alcohol/week: 0.0 oz    Comment: rarely  . Drug use:  No  . Sexual activity: No    Birth control/protection: None  Other Topics Concern  . 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  All other ROS are negative.  BP (!) 150/98   Pulse 66   Temp 98.1 F (36.7 C) (Oral)   Resp 16   Ht 5\' 9"  (1.753 m)   Wt 207 lb (93.9 kg)   SpO2 96%   BMI 30.57 kg/m   Physical Exam  Constitutional: He is oriented to person, place, and time and well-developed, well-nourished, and in no distress.  HENT:  Head: Normocephalic and atraumatic.  Right Ear: Tympanic membrane normal.  Left Ear: Tympanic membrane normal.   Nose: Mucosal edema and rhinorrhea present. Right sinus exhibits maxillary sinus tenderness. Left sinus exhibits maxillary sinus tenderness.  Mouth/Throat: Uvula is midline, oropharynx is clear and moist and mucous membranes are normal.  Eyes: Conjunctivae are normal.  Neck: Neck supple.  Cardiovascular: Normal rate, regular rhythm, normal heart sounds and intact distal pulses.  Pulmonary/Chest: Effort normal and breath sounds normal. No respiratory distress. He has no wheezes. He has no rales. He exhibits no tenderness.  Neurological: He is alert and oriented to person, place, and time.  Skin: Skin is warm and dry. No rash noted.  Psychiatric: Affect normal.  Vitals reviewed.  Recent Results (from the past 2160 hour(s))  T4, free     Status: None   Collection Time: 10/15/17  4:24 PM  Result Value Ref Range   Free T4 0.97 0.60 - 1.60 ng/dL    Comment: Specimens from patients who are undergoing biotin therapy and /or ingesting biotin supplements may contain high levels of biotin.  The higher biotin concentration in these specimens interferes with this Free T4 assay.  Specimens that contain high levels  of biotin may cause false high results for this Free T4 assay.  Please interpret results in light of the total clinical presentation of the patient.    TSH     Status: Abnormal   Collection Time: 10/15/17  4:24 PM  Result Value Ref Range   TSH 6.48 (H) 0.35 - 4.50 uIU/mL  Basic metabolic panel     Status: Abnormal   Collection Time: 10/15/17  4:24 PM  Result Value Ref Range   Sodium 131 (L) 135 - 145 mEq/L   Potassium 4.0 3.5 - 5.1 mEq/L   Chloride 97 96 - 112 mEq/L   CO2 30 19 - 32 mEq/L   Glucose, Bld 82 70 - 99 mg/dL   BUN 8 6 - 23 mg/dL   Creatinine, Ser 0.80 0.40 - 1.50 mg/dL   Calcium 8.9 8.4 - 10.5 mg/dL   GFR 105.22 >60.00 mL/min    Assessment/Plan: 1. Acute non-recurrent maxillary sinusitis Rx augmentin.  Increase fluids.  Rest.  Saline nasal spray.  Probiotic.  Mucinex  as directed.  Humidifier in bedroom. Tessalon per orders.  Call or return to clinic if symptoms are not improving.  - POC Influenza A&B(BINAX/QUICKVUE) -- NEGATIVE - benzonatate (TESSALON)  100 MG capsule; Take 1 capsule (100 mg total) by mouth 2 (two) times daily as needed for cough.  Dispense: 20 capsule; Refill: 0 - amoxicillin-clavulanate (AUGMENTIN) 875-125 MG tablet; Take 1 tablet by mouth 2 (two) times daily.  Dispense: 14 tablet; Refill: 0    Leeanne Rio, PA-C

## 2017-12-01 ENCOUNTER — Ambulatory Visit: Payer: 59 | Admitting: Endocrinology

## 2017-12-02 ENCOUNTER — Other Ambulatory Visit (INDEPENDENT_AMBULATORY_CARE_PROVIDER_SITE_OTHER): Payer: 59

## 2017-12-02 DIAGNOSIS — E890 Postprocedural hypothyroidism: Secondary | ICD-10-CM

## 2017-12-02 DIAGNOSIS — I10 Essential (primary) hypertension: Secondary | ICD-10-CM

## 2017-12-02 LAB — T4, FREE: Free T4: 1.17 ng/dL (ref 0.60–1.60)

## 2017-12-02 LAB — TSH: TSH: 1.25 u[IU]/mL (ref 0.35–4.50)

## 2017-12-03 LAB — BASIC METABOLIC PANEL
BUN: 8 mg/dL (ref 6–23)
CO2: 25 mEq/L (ref 19–32)
Calcium: 8.8 mg/dL (ref 8.4–10.5)
Chloride: 98 mEq/L (ref 96–112)
Creatinine, Ser: 0.85 mg/dL (ref 0.40–1.50)
GFR: 98.06 mL/min (ref >60.00)
Glucose, Bld: 85 mg/dL (ref 70–99)
Potassium: 4.2 mEq/L (ref 3.5–5.1)
Sodium: 133 mEq/L — ABNORMAL LOW (ref 135–145)

## 2017-12-06 ENCOUNTER — Encounter: Payer: Self-pay | Admitting: Endocrinology

## 2017-12-06 ENCOUNTER — Ambulatory Visit: Payer: 59 | Admitting: Endocrinology

## 2017-12-06 VITALS — BP 150/90 | HR 63 | Ht 69.0 in | Wt 208.0 lb

## 2017-12-06 DIAGNOSIS — I10 Essential (primary) hypertension: Secondary | ICD-10-CM

## 2017-12-06 DIAGNOSIS — E890 Postprocedural hypothyroidism: Secondary | ICD-10-CM | POA: Diagnosis not present

## 2017-12-06 NOTE — Progress Notes (Signed)
Patient ID: Jesse Europe [name], male   DOB: January 08, 1961, 57 y.o.   MRN: [id number]    Reason for Appointment:  Hypothyroidism and hypertension, followup visit    History of Present Illness:   The hypothyroidism was first diagnosed in 11/13. Previously had I-131 treatment for Graves' disease He was initially started with 88 mcg but his dose needed to be increased progressively  Complaints are reported by the patient now are none, no complaints of fatigue Still has no complaints of shakiness, sweating or palpitations  He has been treated with brand name SYNTHROID, the dose has been adjusted periodically based on his labs More recently has required progressively higher doses He is now taking 175 mcg daily since 10/2017  He is again taking medication regularly before breakfast No unusual fatigue recently Weight has come down TSH is now finally back to normal   Wt Readings from Last 3 Encounters:  12/06/17 208 lb (94.3 kg)  11/22/17 207 lb (93.9 kg)  10/20/17 215 lb 3.2 oz (97.6 kg)    LABS:    Lab Results  Component Value Date   TSH 1.25 12/02/2017   TSH 6.48 (H) 10/15/2017   TSH 4.77 (H) 08/12/2017   FREET4 1.17 12/02/2017   FREET4 0.97 10/15/2017   FREET4 1.02 08/12/2017     HYPERTENSION management: See review of systems    Allergies as of 12/06/2017      Reactions   Sulfa Drugs Cross Reactors Hives   Codeine Other (See Comments)   Made him very jittery       Medication List        Accurate as of 12/06/17  2:12 PM. Always use your most recent med list.          albuterol 108 (90 Base) MCG/ACT inhaler Commonly known as:  VENTOLIN HFA INHALE 2 PUFFS INTO THE LUNGS EVERY 6 HOURS AS NEEDED FOR WHEEZING/SHORTNESS OF BREATH   ALPRAZolam 0.5 MG tablet Commonly known as:  XANAX TAKE 1 TABLET 3 TIMES DAILY   aspirin 81 MG tablet Take 81 mg by mouth every other day.   carvedilol 12.5 MG tablet Commonly known as:  COREG Take 1.5 tablets (18.75  mg total) by mouth 2 (two) times daily.   cloNIDine 0.1 mg/24hr patch Commonly known as:  CATAPRES-TTS-1 Place 1 patch (0.1 mg total) onto the skin once a week.   flecainide 100 MG tablet Commonly known as:  TAMBOCOR TAKE 1 TABLET BY MOUTH TWICE A DAY   FLUoxetine 40 MG capsule Commonly known as:  PROZAC TAKE 2 CAPSULES BY MOUTH DAILY   ipratropium 0.03 % nasal spray Commonly known as:  ATROVENT USE 2 SPRAYS INTO BOTH NOSTRIL 3 TIMES DAILY   lisinopril 40 MG tablet Commonly known as:  PRINIVIL,ZESTRIL Take 1 tablet (40 mg total) by mouth daily.   simvastatin 40 MG tablet Commonly known as:  ZOCOR Take 1 tablet (40 mg total) by mouth at bedtime.   SYNTHROID 175 MCG tablet Generic drug:  levothyroxine Take 1 tablet (175 mcg total) by mouth daily before breakfast.       Past Medical History:  Diagnosis Date  . Allergy   . Anxiety   . Atrial fibrillation ([place])   . Bell palsy   . Depression   . Diastolic heart failure   . Hypercholesterolemia   . Hypertension   . Pneumonia   . Thyroid disease     Past Surgical History:  Procedure Laterality Date  . CHOLECYSTECTOMY    .  HERNIA REPAIR    . KNEE SURGERY    . VASECTOMY      Family History  Problem Relation Age of Onset  . Heart disease Mother   . Heart attack Mother   . Other Mother        thyroid problems  . Kidney cancer Mother   . Cirrhosis Father   . Other Father        thyroid problems  . Alcohol abuse Father   . Hypertension Father   . Mental illness Brother   . Parkinsonism Brother     Social History:  reports that he has been smoking cigarettes.  He has a 30.00 pack-year smoking history. He has never used smokeless tobacco. He reports that he drinks alcohol. He reports that he does not use drugs.  Allergies:  Allergies  Allergen Reactions  . Sulfa Drugs Cross Reactors Hives  . Codeine Other (See Comments)    Made him very jittery     REVIEW of systems:   HYPERTENSION: He has had  long-standing hypertension which is  managed with lisinopril 40, Catapres 0.1 mg patch and Coreg 1-1/2 tablets twice a day He is not taking hydrochlorothiazide which was causing hyponatremia  Although his blood pressure appears to be better at home when he checks it he appears to have consistently high readings in the office He thinks he may be using an [employer] instrument now He did not bring this for comparison  Today he thinks he took the Mucinex D for his sinus congestion, also may take Allegra Blood pressure was the same or higher on repeat measurement   BP Readings from Last 3 Encounters:  12/06/17 (!) 150/90  11/22/17 (!) 150/98  10/20/17 (!) 142/90   HYPONATREMIA: His sodium is improved but still not back to normal  Lab Results  Component Value Date   NA 133 (L) 12/02/2017   K 4.2 12/02/2017   CL 98 12/02/2017   CO2 25 12/02/2017       Examination:   BP (!) 150/90 (BP Location: Left Arm, Patient Position: Sitting, Cuff Size: Normal)   Pulse 63   Ht 5\' 9"  (1.753 m)   Wt 208 lb (94.3 kg)   SpO2 95%   BMI 30.72 kg/m   No renal artery bruits heard although has increased bowel sounds    Assessments   Hypothyroidism, post I-131 ablation  He appears to be requiring larger doses of supplement progressively  He is feeling better and his weight is coming down with now taking SYNTHROID 175 now  TSH is back to normal  HYPONATREMIA: This is mild with sodium 133 and may be related to SIADH from taking Prozac, asymptomatic currently and will continue to follow  HYPERTENSION:  His blood pressure is consistently poorly controlled He thinks he took a decongestant this morning for his sinuses However not clear if his home blood pressure monitor is accurate he again did not bring this in  Will need to rule out secondary causes for hypertension and will schedule renal ultrasound and fasting renin/aldosterone testing  He will also follow-up with his PCP for review of his  blood pressure treatment To take his monitor for comparison with his visits with the cardiologist and PCP     [name] 12/06/2017, 2:12 PM

## 2017-12-06 NOTE — Patient Instructions (Signed)
No decongestants

## 2017-12-07 NOTE — Progress Notes (Signed)
Electrophysiology Office Note Date: 12/16/2017  ID:  Jesse Europe [name], DOB Jun 25, 1961, MRN [id number]  PCP: Brunetta Jeans, PA-C Electrophysiologist: Lovena Le  CC: PVC follow up  Jesse Europe [name] is a 57 y.o. male seen today for Dr Lovena Le.  He presents today for routine electrophysiology followup.  Since last being seen in our clinic, the patient reports doing very well.  He denies chest pain, palpitations, dyspnea, PND, orthopnea, nausea, vomiting, dizziness, syncope, edema, weight gain, or early satiety.  Past Medical History:  Diagnosis Date  . Allergy   . Anxiety   . Atrial fibrillation ([place])   . Bell palsy   . Depression   . Diastolic heart failure   . Hypercholesterolemia   . Hypertension   . Pneumonia   . Thyroid disease    Past Surgical History:  Procedure Laterality Date  . CHOLECYSTECTOMY    . HERNIA REPAIR    . KNEE SURGERY    . VASECTOMY      Current Outpatient Medications  Medication Sig Dispense Refill  . albuterol (VENTOLIN HFA) 108 (90 Base) MCG/ACT inhaler INHALE 2 PUFFS INTO THE LUNGS EVERY 6 HOURS AS NEEDED FOR WHEEZING/SHORTNESS OF BREATH 18 Inhaler 5  . ALPRAZolam (XANAX) 0.5 MG tablet TAKE 1 TABLET BY MOUTH THREE TIMES A DAY 90 tablet 0  . aspirin 81 MG tablet Take 81 mg by mouth every other day.     . carvedilol (COREG) 12.5 MG tablet Take 1.5 tablets (18.75 mg total) by mouth 2 (two) times daily. 270 tablet 3  . cloNIDine (CATAPRES-TTS-1) 0.1 mg/24hr patch Place 1 patch (0.1 mg total) onto the skin once a week. 4 patch 12  . flecainide (TAMBOCOR) 100 MG tablet TAKE 1 TABLET BY MOUTH TWICE A DAY 60 tablet 11  . ipratropium (ATROVENT) 0.03 % nasal spray USE 2 SPRAYS INTO BOTH NOSTRIL 3 TIMES DAILY 90 mL 3  . lisinopril (PRINIVIL,ZESTRIL) 40 MG tablet Take 1 tablet (40 mg total) by mouth daily. 90 tablet 1  . simvastatin (ZOCOR) 40 MG tablet Take 1 tablet (40 mg total) by mouth at bedtime. 30 tablet 0  . SYNTHROID 175 MCG tablet Take 1  tablet (175 mcg total) by mouth daily before breakfast. 30 tablet 3  . FLUoxetine (PROZAC) 40 MG capsule TAKE 2 CAPSULES BY MOUTH EVERY DAY 180 capsule 1   No current facility-administered medications for this visit.     Allergies:   Sulfa drugs cross reactors and Codeine   Social History: Social History   Socioeconomic History  . Marital status: Married    Spouse name: Not on file  . Number of children: 1  . Years of education: Not on file  . Highest education level: Not on file  Occupational History  . Not on file  Social Needs  . Financial resource strain: Not on file  . Food insecurity:    Worry: Not on file    Inability: Not on file  . Transportation needs:    Medical: Not on file    Non-medical: Not on file  Tobacco Use  . Smoking status: Current Every Day Smoker    Packs/day: 1.00    Years: 30.00    Pack years: 30.00    Types: Cigarettes  . Smokeless tobacco: Never Used  Substance and Sexual Activity  . Alcohol use: Yes    Alcohol/week: 0.0 oz    Comment: rarely  . Drug use: No  . Sexual activity: Never  Birth control/protection: None  Lifestyle  . Physical activity:    Days per week: Not on file    Minutes per session: Not on file  . Stress: Not on file  Relationships  . Social connections:    Talks on phone: Not on file    Gets together: Not on file    Attends religious service: Not on file    Active member of club or organization: Not on file    Attends meetings of clubs or organizations: Not on file    Relationship status: Not on file  . Intimate partner violence:    Fear of current or ex partner: Not on file    Emotionally abused: Not on file    Physically abused: Not on file    Forced sexual activity: Not on file  Other Topics Concern  . 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 Heart disease Mother   . Heart attack Mother   . Other Mother        thyroid problems  . Kidney cancer Mother   . Cirrhosis Father   . Other Father        thyroid problems  . Alcohol abuse Father   . Hypertension Father   . Mental illness Brother   . Parkinsonism Brother     Review of Systems: All other systems reviewed and are otherwise negative except as noted above.   Physical Exam: VS:  BP (!) 166/102   Pulse (!) 56   Ht 5\' 9"  (1.753 m)   Wt 212 lb (96.2 kg)   SpO2 96%   BMI 31.31 kg/m  , BMI Body mass index is 31.31 kg/m. Wt Readings from Last 3 Encounters:  12/15/17 212 lb (96.2 kg)  12/06/17 208 lb (94.3 kg)  11/22/17 207 lb (93.9 kg)    GEN- The patient is well appearing, alert and oriented x 3 today.   HEENT: normocephalic, atraumatic; sclera clear, conjunctiva pink; hearing intact; oropharynx clear; neck supple  Lungs- Clear to ausculation bilaterally, normal work of breathing.  No wheezes, rales, rhonchi Heart- Regular rate and rhythm  GI- soft, non-tender, non-distended, bowel sounds present  Extremities- no clubbing, cyanosis, or edema  MS- no significant deformity or atrophy Skin- warm and dry, no rash or lesion  Psych- euthymic mood, full affect Neuro- strength and sensation are intact   EKG:  EKG is ordered today. The ekg ordered today shows sinus bradycardia, rate 53, PR 168, QRS 106, QTc 456  Recent Labs: 01/18/2017: ALT 20 06/22/2017: Hemoglobin 15.5; Platelets 248 12/02/2017: BUN 8; Creatinine, Ser 0.85; Potassium 4.2; Sodium 133; TSH 1.25    Other studies Reviewed: Additional studies/ records that were reviewed today include: Dr Tanna Furry office notes  Assessment and Plan:  1.  PVC's Well controlled with Flecanide EKG stable  today  2.  HTN BP elevated today Repeat by me 150/90 He checks his blood pressure at home and states systolic is always 973-532'D.  Advised to continue to monitor and see PCP in next few weeks.   3.  Sinus node dysfunction Stable No change required today No current indication for pacing  4.  Paroxysmal atrial fibrillation  Occurred in the setting of thyrotoxicosis  No clinical recurrence  CHADS2VASC is 1   Current medicines are reviewed at length with the patient today.   The patient does not have concerns regarding his medicines.  The following changes were made today:  none  Labs/ tests ordered today include: none No orders of the defined types were placed in this encounter.    Disposition:   Follow up with Dr Lovena Le 6 months    Signed, Chanetta Marshall, NP 12/16/2017 8:13 AM   [address] [place] [id number] [phone] (office) [phone] (fax)

## 2017-12-13 ENCOUNTER — Other Ambulatory Visit: Payer: Self-pay | Admitting: Family Medicine

## 2017-12-13 ENCOUNTER — Encounter: Payer: Self-pay | Admitting: Emergency Medicine

## 2017-12-13 NOTE — Telephone Encounter (Signed)
Your patient 

## 2017-12-14 ENCOUNTER — Encounter: Payer: Self-pay | Admitting: Emergency Medicine

## 2017-12-14 NOTE — Telephone Encounter (Signed)
Advised patient that rx for Xanax is ready for pick up at the front desk. Advised he is due for controlled substance contract. He is agreeable.

## 2017-12-15 ENCOUNTER — Encounter: Payer: Self-pay | Admitting: Nurse Practitioner

## 2017-12-15 ENCOUNTER — Ambulatory Visit: Payer: 59 | Admitting: Nurse Practitioner

## 2017-12-15 ENCOUNTER — Other Ambulatory Visit: Payer: Self-pay | Admitting: Physician Assistant

## 2017-12-15 VITALS — BP 166/102 | HR 56 | Ht 69.0 in | Wt 212.0 lb

## 2017-12-15 DIAGNOSIS — I493 Ventricular premature depolarization: Secondary | ICD-10-CM

## 2017-12-15 DIAGNOSIS — I480 Paroxysmal atrial fibrillation: Secondary | ICD-10-CM

## 2017-12-15 DIAGNOSIS — I10 Essential (primary) hypertension: Secondary | ICD-10-CM | POA: Diagnosis not present

## 2017-12-15 DIAGNOSIS — I495 Sick sinus syndrome: Secondary | ICD-10-CM | POA: Diagnosis not present

## 2017-12-15 NOTE — Telephone Encounter (Signed)
Last OV 11/22/2017 Acute visit, no follow up scheduled  Last filled 07/01/2017, 180 capsules with 1 refill

## 2017-12-15 NOTE — Patient Instructions (Signed)
Medication Instructions:   Your physician recommends that you continue on your current medications as directed. Please refer to the Current Medication list given to you today.   If you need a refill on your cardiac medications before your next appointment, please call your pharmacy.  Labwork: NONE ORDERED  TODAY    Testing/Procedures: NONE ORDERED  TODAY    Follow-Up: Your physician wants you to follow-up in:  IN  6  MONTHS WITH DR TAYLOR   You will receive a reminder letter in the mail two months in advance. If you don't receive a letter, please call our office to schedule the follow-up appointment.      Any Other Special Instructions Will Be Listed Below (If Applicable).                                                                                                                                                   

## 2017-12-26 ENCOUNTER — Other Ambulatory Visit: Payer: Self-pay | Admitting: Physician Assistant

## 2017-12-26 DIAGNOSIS — E785 Hyperlipidemia, unspecified: Secondary | ICD-10-CM

## 2017-12-27 NOTE — Telephone Encounter (Signed)
Last OV 11/22/2017 for Acute non-recurrent maxillary sinusitis, No future appointment scheduled  Last filled 11/11/17, #30 with 0 refills, Note to schedule a physical.   Are you still prescribing this medication? Recently saw cardiology and note to see [place] in a few weeks.

## 2017-12-30 NOTE — Addendum Note (Signed)
Addended by: Sandrea Hammond D on: 12/30/2017 03:07 PM   Modules accepted: Orders

## 2017-12-31 ENCOUNTER — Other Ambulatory Visit: Payer: Self-pay | Admitting: Physician Assistant

## 2017-12-31 DIAGNOSIS — E785 Hyperlipidemia, unspecified: Secondary | ICD-10-CM

## 2018-01-11 ENCOUNTER — Other Ambulatory Visit: Payer: Self-pay | Admitting: Physician Assistant

## 2018-01-11 NOTE — Telephone Encounter (Signed)
Last OV 11/22/17 (sinus), No future OV  Last filled 12/13/17, # 90 with 0 refills

## 2018-01-17 ENCOUNTER — Other Ambulatory Visit: Payer: Self-pay | Admitting: Physician Assistant

## 2018-01-17 DIAGNOSIS — E785 Hyperlipidemia, unspecified: Secondary | ICD-10-CM

## 2018-02-03 ENCOUNTER — Ambulatory Visit (INDEPENDENT_AMBULATORY_CARE_PROVIDER_SITE_OTHER): Payer: 59 | Admitting: Physician Assistant

## 2018-02-03 ENCOUNTER — Other Ambulatory Visit: Payer: Self-pay

## 2018-02-03 ENCOUNTER — Encounter: Payer: Self-pay | Admitting: Physician Assistant

## 2018-02-03 VITALS — BP 138/82 | HR 62 | Temp 98.4°F | Resp 16 | Ht 69.0 in | Wt 196.4 lb

## 2018-02-03 DIAGNOSIS — E785 Hyperlipidemia, unspecified: Secondary | ICD-10-CM | POA: Diagnosis not present

## 2018-02-03 DIAGNOSIS — S30861A Insect bite (nonvenomous) of abdominal wall, initial encounter: Secondary | ICD-10-CM

## 2018-02-03 DIAGNOSIS — Z1211 Encounter for screening for malignant neoplasm of colon: Secondary | ICD-10-CM | POA: Diagnosis not present

## 2018-02-03 DIAGNOSIS — F418 Other specified anxiety disorders: Secondary | ICD-10-CM

## 2018-02-03 DIAGNOSIS — Z125 Encounter for screening for malignant neoplasm of prostate: Secondary | ICD-10-CM | POA: Diagnosis not present

## 2018-02-03 DIAGNOSIS — I10 Essential (primary) hypertension: Secondary | ICD-10-CM | POA: Diagnosis not present

## 2018-02-03 DIAGNOSIS — Z23 Encounter for immunization: Secondary | ICD-10-CM

## 2018-02-03 DIAGNOSIS — W57XXXA Bitten or stung by nonvenomous insect and other nonvenomous arthropods, initial encounter: Secondary | ICD-10-CM | POA: Diagnosis not present

## 2018-02-03 DIAGNOSIS — Z0000 Encounter for general adult medical examination without abnormal findings: Secondary | ICD-10-CM | POA: Diagnosis not present

## 2018-02-03 LAB — COMPREHENSIVE METABOLIC PANEL
ALT: 14 U/L (ref 0–53)
AST: 15 U/L (ref 0–37)
Albumin: 4.2 g/dL (ref 3.5–5.2)
Alkaline Phosphatase: 72 U/L (ref 39–117)
BUN: 8 mg/dL (ref 6–23)
CO2: 29 mEq/L (ref 19–32)
Calcium: 9.3 mg/dL (ref 8.4–10.5)
Chloride: 101 mEq/L (ref 96–112)
Creatinine, Ser: 0.8 mg/dL (ref 0.40–1.50)
GFR: 105.11 mL/min (ref >60.00)
Glucose, Bld: 97 mg/dL (ref 70–99)
Potassium: 4.1 mEq/L (ref 3.5–5.1)
Sodium: 136 mEq/L (ref 135–145)
Total Bilirubin: 0.6 mg/dL (ref 0.2–1.2)
Total Protein: 6.7 g/dL (ref 6.0–8.3)

## 2018-02-03 LAB — CBC WITH DIFFERENTIAL/PLATELET
Basophils Absolute: 0 10*3/uL (ref 0.0–0.1)
Basophils Relative: 0.3 % (ref 0.0–3.0)
Eosinophils Absolute: 0.3 10*3/uL (ref 0.0–0.7)
Eosinophils Relative: 3.2 % (ref 0.0–5.0)
HCT: 47.4 % (ref 39.0–52.0)
Hemoglobin: 16 g/dL (ref 13.0–17.0)
Lymphocytes Relative: 18.8 % (ref 12.0–46.0)
Lymphs Abs: 1.7 10*3/uL (ref 0.7–4.0)
MCHC: 33.8 g/dL (ref 30.0–36.0)
MCV: 92.1 fl (ref 78.0–100.0)
Monocytes Absolute: 0.7 10*3/uL (ref 0.1–1.0)
Monocytes Relative: 7.8 % (ref 3.0–12.0)
Neutro Abs: 6.3 10*3/uL (ref 1.4–7.7)
Neutrophils Relative %: 69.9 % (ref 43.0–77.0)
Platelets: 250 10*3/uL (ref 150.0–400.0)
RBC: 5.15 Mil/uL (ref 4.22–5.81)
RDW: 13.3 % (ref 11.5–15.5)
WBC: 9 10*3/uL (ref 4.0–10.5)

## 2018-02-03 LAB — URINALYSIS, ROUTINE W REFLEX MICROSCOPIC
Bilirubin Urine: NEGATIVE
Hgb urine dipstick: NEGATIVE
Ketones, ur: NEGATIVE
Leukocytes, UA: NEGATIVE
Nitrite: NEGATIVE
Specific Gravity, Urine: 1.02 (ref 1.000–1.030)
Total Protein, Urine: NEGATIVE
Urine Glucose: NEGATIVE
Urobilinogen, UA: 0.2 (ref 0.0–1.0)
pH: 6.5 (ref 5.0–8.0)

## 2018-02-03 LAB — LIPID PANEL
Cholesterol: 220 mg/dL — ABNORMAL HIGH (ref 0–200)
HDL: 43.9 mg/dL (ref >39.00)
LDL Cholesterol: 147 mg/dL — ABNORMAL HIGH (ref 0–99)
NonHDL: 175.67
Total CHOL/HDL Ratio: 5
Triglycerides: 144 mg/dL (ref 0.0–149.0)
VLDL: 28.8 mg/dL (ref 0.0–40.0)

## 2018-02-03 LAB — HEMOGLOBIN A1C: Hgb A1c MFr Bld: 5.5 % (ref 4.6–6.5)

## 2018-02-03 LAB — PSA: PSA: 3.26 ng/mL (ref 0.10–4.00)

## 2018-02-03 MED ORDER — DOXYCYCLINE HYCLATE 100 MG PO CAPS: 200.0000 mg | ORAL_CAPSULE | Freq: Every day | ORAL | 0 refills | Status: DC

## 2018-02-03 NOTE — Patient Instructions (Signed)
Please go to the lab for blood work.   Our office will call you with your results unless you have chosen to receive results via MyChart.  If your blood work is normal we will follow-up each year for physicals and as scheduled for chronic medical problems.  If anything is abnormal we will treat accordingly and get you in for a follow-up.  Please take the solitary dose of the Doxycycline as directed for tick-bite infection prophylaxis. We will be obtaining records to see when you are truly due for colonoscopy.   Preventive Care 40-64 Years, Male Preventive care refers to lifestyle choices and visits with your health care provider that can promote health and wellness. What does preventive care include?  A yearly physical exam. This is also called an annual well check.  Dental exams once or twice a year.  Routine eye exams. Ask your health care provider how often you should have your eyes checked.  Personal lifestyle choices, including: ? Daily care of your teeth and gums. ? Regular physical activity. ? Eating a healthy diet. ? Avoiding tobacco and drug use. ? Limiting alcohol use. ? Practicing safe sex. ? Taking low-dose aspirin every day starting at age 31. What happens during an annual well check? The services and screenings done by your health care provider during your annual well check will depend on your age, overall health, lifestyle risk factors, and family history of disease. Counseling Your health care provider may ask you questions about your:  Alcohol use.  Tobacco use.  Drug use.  Emotional well-being.  Home and relationship well-being.  Sexual activity.  Eating habits.  Work and work Statistician.  Screening You may have the following tests or measurements:  Height, weight, and BMI.  Blood pressure.  Lipid and cholesterol levels. These may be checked every 5 years, or more frequently if you are over 34 years old.  Skin check.  Lung cancer  screening. You may have this screening every year starting at age 51 if you have a 30-pack-year history of smoking and currently smoke or have quit within the past 15 years.  Fecal occult blood test (FOBT) of the stool. You may have this test every year starting at age 86.  Flexible sigmoidoscopy or colonoscopy. You may have a sigmoidoscopy every 5 years or a colonoscopy every 10 years starting at age 46.  Prostate cancer screening. Recommendations will vary depending on your family history and other risks.  Hepatitis C blood test.  Hepatitis B blood test.  Sexually transmitted disease (STD) testing.  Diabetes screening. This is done by checking your blood sugar (glucose) after you have not eaten for a while (fasting). You may have this done every 1-3 years.  Discuss your test results, treatment options, and if necessary, the need for more tests with your health care provider. Vaccines Your health care provider may recommend certain vaccines, such as:  Influenza vaccine. This is recommended every year.  Tetanus, diphtheria, and acellular pertussis (Tdap, Td) vaccine. You may need a Td booster every 10 years.  Varicella vaccine. You may need this if you have not been vaccinated.  Zoster vaccine. You may need this after age 71.  Measles, mumps, and rubella (MMR) vaccine. You may need at least one dose of MMR if you were born in 1957 or later. You may also need a second dose.  Pneumococcal 13-valent conjugate (PCV13) vaccine. You may need this if you have certain conditions and have not been vaccinated.  Pneumococcal polysaccharide (PPSV23)  vaccine. You may need one or two doses if you smoke cigarettes or if you have certain conditions.  Meningococcal vaccine. You may need this if you have certain conditions.  Hepatitis A vaccine. You may need this if you have certain conditions or if you travel or work in places where you may be exposed to hepatitis A.  Hepatitis B vaccine. You  may need this if you have certain conditions or if you travel or work in places where you may be exposed to hepatitis B.  Haemophilus influenzae type b (Hib) vaccine. You may need this if you have certain risk factors.  Talk to your health care provider about which screenings and vaccines you need and how often you need them. This information is not intended to replace advice given to you by your health care provider. Make sure you discuss any questions you have with your health care provider. Document Released: 09/27/2015 Document Revised: 05/20/2016 Document Reviewed: 07/02/2015 Elsevier Interactive Patient Education  [employer].

## 2018-02-03 NOTE — Progress Notes (Signed)
Patient presents to clinic today for annual exam.  Patient is fasting for labs.  Acute Concerns: Patient also complains of tick bite of lower abdomen, left-sided. Monday morning first noticed this. Feels it was likely there for a few days as he has mowed early in the weekend. Was able to remove the tick with his fingernail. Has noted itching of the area around the bite. Some redness of the area but denies rash. Denies fever, chills, nausea/vomiting.   Chronic Issues: Hypertension -- Patient is currently on regimen of Carvedilol 12.5 mg BID, Lisinopril 40 mg daily. Is taking as directed. Is also taking Tambocor prescribed by his Cardiologist. Patient denies chest pain, palpitations, lightheadedness, dizziness, vision changes or frequent headaches.  BP Readings from Last 3 Encounters:  02/03/18 138/82  12/15/17 (!) 166/102  12/06/17 (!) 150/90   Hyperlipidemia -- Has been on simvastatin 40 mg daily. Has been out of medication for some time. Is trying to watch diet and keep active. Is fasting for labs today.   Depression and Anxiety -- Patient is currently on a regimen of Fluoxetine 40 mg and Alprazolam 0.5 mg. Is taking as directed and notes doing very well. Denies SI/HI.   Hypothyroidism -- Followed by Dr. Dwyane Dee (Endocrinology). Recent TSH levels within normal limits. Is taking his levothroid as directed.   Health Maintenance: Immunizations -- Due for TDaP. Agrees to have today. Colonoscopy -- Overdue per EMR. Patient endorses not having 1st colonoscopy until age 57. He is 57 and states he should not   Past Medical History:  Diagnosis Date  . Allergy   . Anxiety   . Atrial fibrillation ([place])   . Bell palsy   . Depression   . Diastolic heart failure   . Hypercholesterolemia   . Hypertension   . Pneumonia   . Thyroid disease     Past Surgical History:  Procedure Laterality Date  . CHOLECYSTECTOMY    . HERNIA REPAIR    . KNEE SURGERY    . VASECTOMY      Current Outpatient  Medications on File Prior to Visit  Medication Sig Dispense Refill  . albuterol (VENTOLIN HFA) 108 (90 Base) MCG/ACT inhaler INHALE 2 PUFFS INTO THE LUNGS EVERY 6 HOURS AS NEEDED FOR WHEEZING/SHORTNESS OF BREATH 18 Inhaler 5  . ALPRAZolam (XANAX) 0.5 MG tablet TAKE 1 TABLET 3 TIMES A DAY 90 tablet 0  . aspirin 81 MG tablet Take 81 mg by mouth every other day.     . carvedilol (COREG) 12.5 MG tablet Take 1.5 tablets (18.75 mg total) by mouth 2 (two) times daily. 270 tablet 3  . cloNIDine (CATAPRES-TTS-1) 0.1 mg/24hr patch Place 1 patch (0.1 mg total) onto the skin once a week. 4 patch 12  . flecainide (TAMBOCOR) 100 MG tablet TAKE 1 TABLET BY MOUTH TWICE A DAY 60 tablet 11  . FLUoxetine (PROZAC) 40 MG capsule TAKE 2 CAPSULES BY MOUTH EVERY DAY 180 capsule 1  . lisinopril (PRINIVIL,ZESTRIL) 40 MG tablet Take 1 tablet (40 mg total) by mouth daily. 90 tablet 1  . simvastatin (ZOCOR) 40 MG tablet Take 1 tablet (40 mg total) by mouth at bedtime. 30 tablet 0  . SYNTHROID 175 MCG tablet Take 1 tablet (175 mcg total) by mouth daily before breakfast. 30 tablet 3  . ipratropium (ATROVENT) 0.03 % nasal spray USE 2 SPRAYS INTO BOTH NOSTRIL 3 TIMES DAILY (Patient not taking: Reported on 02/03/2018) 90 mL 3   No current facility-administered medications on file prior to  visit.     Allergies  Allergen Reactions  . Sulfa Drugs Cross Reactors Hives  . Codeine Other (See Comments)    Made him very jittery     Family History  Problem Relation Age of Onset  . Heart disease Mother   . Heart attack Mother   . Other Mother        thyroid problems  . Kidney cancer Mother   . Cirrhosis Father   . Other Father        thyroid problems  . Alcohol abuse Father   . Hypertension Father   . Mental illness Brother   . Parkinsonism Brother     Social History   Socioeconomic History  . Marital status: Married    Spouse name: Not on file  . Number of children: 1  . Years of education: Not on file  . Highest  education level: Not on file  Occupational History  . Not on file  Social Needs  . Financial resource strain: Not on file  . Food insecurity:    Worry: Not on file    Inability: Not on file  . Transportation needs:    Medical: Not on file    Non-medical: Not on file  Tobacco Use  . Smoking status: Current Every Day Smoker    Packs/day: 1.00    Years: 30.00    Pack years: 30.00    Types: Cigarettes  . Smokeless tobacco: Never Used  Substance and Sexual Activity  . Alcohol use: Yes    Alcohol/week: 0.0 oz    Comment: rarely  . Drug use: No  . Sexual activity: Never    Birth control/protection: None  Lifestyle  . Physical activity:    Days per week: Not on file    Minutes per session: Not on file  . Stress: Not on file  Relationships  . Social connections:    Talks on phone: Not on file    Gets together: Not on file    Attends religious service: Not on file    Active member of club or organization: Not on file    Attends meetings of clubs or organizations: Not on file    Relationship status: Not on file  . Intimate partner violence:    Fear of current or ex partner: Not on file    Emotionally abused: Not on file    Physically abused: Not on file    Forced sexual activity: Not on file  Other Topics Concern  . 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  HENT: Negative for ear discharge, ear pain, hearing loss and tinnitus.   Eyes: Negative for blurred vision, double vision, photophobia and pain.  Respiratory: Negative for cough and shortness of breath.   Cardiovascular: Negative for chest pain and  palpitations.  Gastrointestinal: Negative for abdominal pain, blood in stool, constipation, diarrhea, heartburn, melena, nausea and vomiting.  Genitourinary: Negative for dysuria, flank pain, frequency, hematuria and urgency.  Musculoskeletal:  Negative for falls.  Neurological: Negative for dizziness, loss of consciousness and headaches.  Endo/Heme/Allergies: Negative for environmental allergies.  Psychiatric/Behavioral: Negative for depression, hallucinations, substance abuse and suicidal ideas. The patient is not nervous/anxious and does not have insomnia.    BP 138/82   Pulse 62   Temp 98.4 F (36.9 C) (Oral)   Resp 16   Ht 5\' 9"  (1.753 m)   Wt 196 lb 6.4 oz (89.1 kg)   SpO2 94%   BMI 29.00 kg/m   Physical Exam  Constitutional: He is oriented to person, place, and time. He appears well-developed and well-nourished. No distress.  HENT:  Head: Normocephalic and atraumatic.  Right Ear: Tympanic membrane, external ear and ear canal normal.  Left Ear: Tympanic membrane, external ear and ear canal normal.  Nose: Nose normal.  Mouth/Throat: Oropharynx is clear and moist and mucous membranes are normal. No posterior oropharyngeal edema or posterior oropharyngeal erythema.  Eyes: Pupils are equal, round, and reactive to light. Conjunctivae are normal.  Neck: Neck supple. No thyromegaly present.  Cardiovascular: Normal rate, regular rhythm, normal heart sounds and intact distal pulses.  Pulmonary/Chest: Effort normal and breath sounds normal. No respiratory distress. He has no wheezes. He has no rales. He exhibits no tenderness.  Abdominal: Soft. Bowel sounds are normal. He exhibits no distension and no mass. There is no tenderness. There is no rebound and no guarding.  Lymphadenopathy:    He has no cervical adenopathy.  Neurological: He is alert and oriented to person, place, and time. No cranial nerve deficit.  Skin: Skin is warm and dry. No rash noted. He is not diaphoretic.    Psychiatric: He has a normal mood and affect.  Vitals reviewed.  Recent Results (from the past 2160 hour(s))  POC Influenza A&B(BINAX/QUICKVUE)     Status: Normal   Collection Time: 11/22/17 11:00 AM  Result Value Ref Range   Influenza A, POC Negative Negative   Influenza B, POC Negative Negative  T4, free     Status: None   Collection Time: 12/02/17  4:25 PM  Result Value Ref Range   Free T4 1.17 0.60 - 1.60 ng/dL    Comment: Specimens from patients who are undergoing biotin therapy and /or ingesting biotin supplements may contain high levels of biotin.  The higher biotin concentration in these specimens interferes with this Free T4 assay.  Specimens that contain high levels  of biotin may cause false high results for this Free T4 assay.  Please interpret results in light of the total clinical presentation of the patient.    TSH     Status: None   Collection Time: 12/02/17  4:25 PM  Result Value Ref Range   TSH 1.25 0.35 - 4.50 uIU/mL  Basic metabolic panel     Status: Abnormal   Collection Time: 12/02/17  4:25 PM  Result Value Ref Range   Sodium 133 (L) 135 - 145 mEq/L   Potassium 4.2 3.5 - 5.1 mEq/L   Chloride 98 96 - 112 mEq/L   CO2 25 19 - 32 mEq/L   Glucose, Bld 85 70 - 99 mg/dL   BUN 8 6 - 23 mg/dL   Creatinine, Ser 0.85 0.40 - 1.50 mg/dL   Calcium 8.8 8.4 - 10.5 mg/dL   GFR 98.06 >60.00 mL/min  CBC with Differential/Platelet     Status: None   Collection Time: 02/03/18  9:22 AM  Result Value Ref Range   WBC 9.0 4.0 - 10.5 K/uL  RBC 5.15 4.22 - 5.81 Mil/uL   Hemoglobin 16.0 13.0 - 17.0 g/dL   HCT 47.4 39.0 - 52.0 %   MCV 92.1 78.0 - 100.0 fl   MCHC 33.8 30.0 - 36.0 g/dL   RDW 13.3 11.5 - 15.5 %   Platelets 250.0 150.0 - 400.0 K/uL   Neutrophils Relative % 69.9 43.0 - 77.0 %   Lymphocytes Relative 18.8 12.0 - 46.0 %   Monocytes Relative 7.8 3.0 - 12.0 %   Eosinophils Relative 3.2 0.0 - 5.0 %   Basophils Relative 0.3 0.0 - 3.0 %   Neutro Abs 6.3 1.4 - 7.7 K/uL    Lymphs Abs 1.7 0.7 - 4.0 K/uL   Monocytes Absolute 0.7 0.1 - 1.0 K/uL   Eosinophils Absolute 0.3 0.0 - 0.7 K/uL   Basophils Absolute 0.0 0.0 - 0.1 K/uL  Comprehensive metabolic panel     Status: None   Collection Time: 02/03/18  9:22 AM  Result Value Ref Range   Sodium 136 135 - 145 mEq/L   Potassium 4.1 3.5 - 5.1 mEq/L   Chloride 101 96 - 112 mEq/L   CO2 29 19 - 32 mEq/L   Glucose, Bld 97 70 - 99 mg/dL   BUN 8 6 - 23 mg/dL   Creatinine, Ser 0.80 0.40 - 1.50 mg/dL   Total Bilirubin 0.6 0.2 - 1.2 mg/dL   Alkaline Phosphatase 72 39 - 117 U/L   AST 15 0 - 37 U/L   ALT 14 0 - 53 U/L   Total Protein 6.7 6.0 - 8.3 g/dL   Albumin 4.2 3.5 - 5.2 g/dL   Calcium 9.3 8.4 - 10.5 mg/dL   GFR 105.11 >60.00 mL/min  Hemoglobin A1c     Status: None   Collection Time: 02/03/18  9:22 AM  Result Value Ref Range   Hgb A1c MFr Bld 5.5 4.6 - 6.5 %    Comment: Glycemic Control Guidelines for People with Diabetes:Non Diabetic:  <6%Goal of Therapy: <7%Additional Action Suggested:  >8%   PSA     Status: None   Collection Time: 02/03/18  9:22 AM  Result Value Ref Range   PSA 3.26 0.10 - 4.00 ng/mL    Comment: Test performed using Access Hybritech PSA Assay, a parmagnetic partical, chemiluminecent immunoassay.  Lipid panel     Status: Abnormal   Collection Time: 02/03/18  9:22 AM  Result Value Ref Range   Cholesterol 220 (H) 0 - 200 mg/dL    Comment: ATP III Classification       Desirable:  < 200 mg/dL               Borderline High:  200 - 239 mg/dL          High:  > = 240 mg/dL   Triglycerides 144.0 0.0 - 149.0 mg/dL    Comment: Normal:  <150 mg/dLBorderline High:  150 - 199 mg/dL   HDL 43.90 >39.00 mg/dL   VLDL 28.8 0.0 - 40.0 mg/dL   LDL Cholesterol 147 (H) 0 - 99 mg/dL   Total CHOL/HDL Ratio 5     Comment:                Men          Women1/2 Average Risk     3.4          3.3Average Risk          5.0          4.42X Average Risk  9.6          7.13X Average Risk          15.0          11.0                        NonHDL 175.67     Comment: NOTE:  Non-HDL goal should be 30 mg/dL higher than patient's LDL goal (i.e. LDL goal of < 70 mg/dL, would have non-HDL goal of < 100 mg/dL)  Urinalysis, Routine w reflex microscopic     Status: None   Collection Time: 02/03/18  9:22 AM  Result Value Ref Range   Color, Urine YELLOW Yellow;Lt. Yellow   APPearance CLEAR Clear   Specific Gravity, Urine 1.020 1.000 - 1.030   pH 6.5 5.0 - 8.0   Total Protein, Urine NEGATIVE Negative   Urine Glucose NEGATIVE Negative   Ketones, ur NEGATIVE Negative   Bilirubin Urine NEGATIVE Negative   Hgb urine dipstick NEGATIVE Negative   Urobilinogen, UA 0.2 0.0 - 1.0   Leukocytes, UA NEGATIVE Negative   Nitrite NEGATIVE Negative   WBC, UA 0-2/hpf 0-2/hpf   RBC / HPF 0-2/hpf 0-2/hpf   Squamous Epithelial / LPF Rare(0-4/hpf) Rare(0-4/hpf)  B. burgdorfi antibodies     Status: None   Collection Time: 02/03/18  9:22 AM  Result Value Ref Range   B burgdorferi Ab IgG+IgM <0.90 index    Comment:                    Index                Interpretation                    -----                --------------                    < 0.90               Negative                    0.90-1.09            Equivocal                    > 1.09               Positive . As recommended by the Food and Drug Administration  (FDA), all samples with positive or equivocal  results in a Borrelia burgdorferi antibody screen will be tested using a blot method. Positive or  equivocal screening test results should not be  interpreted as truly positive until verified as such  using a supplemental assay (e.g., B. burgdorferi blot). . The screening test and/or blot for B. burgdorferi  antibodies may be falsely negative in early stages of Lyme disease, including the period when erythema  migrans is apparent. .    Assessment/Plan: Visit for preventive health examination Depression screen negative. Health Maintenance reviewed -- TDaP  due. Updated today.. Preventive schedule discussed and handout given in AVS. Will obtain fasting labs today.    Prostate cancer screening The natural history of prostate cancer and ongoing controversy regarding screening and potential treatment outcomes of prostate cancer has been discussed with the patient. The meaning of a false positive PSA and a false negative PSA has been discussed. He indicates understanding  of the limitations of this screening test and wishes to proceed with screening PSA testing.   Need for Tdap vaccination TDaP updated today.  Dyslipidemia Off of medication. Will obtain fasting lipids today. Will adjust medication and refill as indicated. Continue working on diet and exercise recommendations.   Depression with anxiety Stable on current regimen. Continue same. Will monitor.   Colon cancer screening Will need to get records for review as there is some discrepancy in when his last colonoscopy was.   Tick bite of abdomen No sign of cellulitis or rash of tick-borne illness. Will check titers. 1 dose of Doxycycline given today as prevention due to anxiety.   Hypertension Bp stable. Repeat BMP today. Continue current regimen.     Leeanne Rio, PA-C

## 2018-02-04 ENCOUNTER — Other Ambulatory Visit: Payer: Self-pay | Admitting: Physician Assistant

## 2018-02-04 DIAGNOSIS — E785 Hyperlipidemia, unspecified: Secondary | ICD-10-CM

## 2018-02-04 DIAGNOSIS — R351 Nocturia: Principal | ICD-10-CM

## 2018-02-04 DIAGNOSIS — N401 Enlarged prostate with lower urinary tract symptoms: Secondary | ICD-10-CM

## 2018-02-04 LAB — B. BURGDORFI ANTIBODIES: B burgdorferi Ab IgG+IgM: <0.9 index

## 2018-02-04 LAB — ROCKY MTN SPOTTED FVR ABS PNL(IGG+IGM)
RMSF IgG: NOT DETECTED
RMSF IgM: NOT DETECTED

## 2018-02-04 MED ORDER — TAMSULOSIN HCL 0.4 MG PO CAPS: 0.4000 mg | ORAL_CAPSULE | Freq: Every day | ORAL | 1 refills | Status: DC

## 2018-02-04 MED ORDER — SIMVASTATIN 40 MG PO TABS: 40.0000 mg | ORAL_TABLET | Freq: Every day | ORAL | 1 refills | Status: DC

## 2018-02-04 NOTE — Assessment & Plan Note (Signed)
The natural history of prostate cancer and ongoing controversy regarding screening and potential treatment outcomes of prostate cancer has been discussed with the patient. The meaning of a false positive PSA and a false negative PSA has been discussed. He indicates understanding of the limitations of this screening test and wishes  to proceed with screening PSA testing.  

## 2018-02-04 NOTE — Assessment & Plan Note (Signed)
>>  ASSESSMENT AND PLAN FOR DYSLIPIDEMIA WRITTEN ON 02/04/2018  1:01 PM BY Marcelline Mates C, PA-C  Off of medication. Will obtain fasting lipids today. Will adjust medication and refill as indicated. Continue working on diet and exercise recommendations.

## 2018-02-04 NOTE — Assessment & Plan Note (Signed)
Off of medication. Will obtain fasting lipids today. Will adjust medication and refill as indicated. Continue working on diet and exercise recommendations.

## 2018-02-04 NOTE — Assessment & Plan Note (Signed)
Depression screen negative. Health Maintenance reviewed -- TDaP due. Updated today.. Preventive schedule discussed and handout given in AVS. Will obtain fasting labs today.

## 2018-02-04 NOTE — Assessment & Plan Note (Signed)
Will need to get records for review as there is some discrepancy in when his last colonoscopy was.

## 2018-02-04 NOTE — Assessment & Plan Note (Signed)
Bp stable. Repeat BMP today. Continue current regimen.

## 2018-02-04 NOTE — Assessment & Plan Note (Signed)
TDaP updated today. 

## 2018-02-04 NOTE — Assessment & Plan Note (Signed)
Stable on current regimen. Continue same. Will monitor.

## 2018-02-04 NOTE — Assessment & Plan Note (Signed)
No sign of cellulitis or rash of tick-borne illness. Will check titers. 1 dose of Doxycycline given today as prevention due to anxiety.

## 2018-02-05 ENCOUNTER — Other Ambulatory Visit: Payer: Self-pay | Admitting: Physician Assistant

## 2018-02-05 DIAGNOSIS — I10 Essential (primary) hypertension: Secondary | ICD-10-CM

## 2018-02-07 ENCOUNTER — Encounter: Payer: Self-pay | Admitting: Family Medicine

## 2018-02-08 ENCOUNTER — Encounter: Payer: 59 | Admitting: Physician Assistant

## 2018-02-09 ENCOUNTER — Other Ambulatory Visit: Payer: Self-pay | Admitting: Physician Assistant

## 2018-02-09 NOTE — Telephone Encounter (Signed)
Request for Xanax last filled on 01/11/18 #90 CSC: 01/14/17 UDS: 01/15/17  Last OV: 02/03/18 CPE  Please advise

## 2018-02-16 ENCOUNTER — Other Ambulatory Visit: Payer: Self-pay | Admitting: Endocrinology

## 2018-02-25 ENCOUNTER — Encounter: Payer: Self-pay | Admitting: Emergency Medicine

## 2018-03-03 ENCOUNTER — Other Ambulatory Visit: Payer: 59

## 2018-03-03 DIAGNOSIS — I10 Essential (primary) hypertension: Secondary | ICD-10-CM | POA: Diagnosis not present

## 2018-03-08 ENCOUNTER — Encounter: Payer: Self-pay | Admitting: Endocrinology

## 2018-03-08 ENCOUNTER — Ambulatory Visit: Payer: 59 | Admitting: Endocrinology

## 2018-03-08 VITALS — BP 140/84 | HR 89 | Ht 69.0 in | Wt 204.4 lb

## 2018-03-08 DIAGNOSIS — E890 Postprocedural hypothyroidism: Secondary | ICD-10-CM | POA: Diagnosis not present

## 2018-03-08 DIAGNOSIS — I10 Essential (primary) hypertension: Secondary | ICD-10-CM

## 2018-03-08 NOTE — Progress Notes (Signed)
Patient ID: Jesse Europe [name], male   DOB: 1961/05/06, 57 y.o.   MRN: [id number]    Reason for Appointment:  Hypothyroidism and hypertension, followup visit    History of Present Illness:   The hypothyroidism was first diagnosed in 11/13. Previously had I-131 treatment for Graves' disease He was initially started with 88 mcg but his dose needed to be increased progressively  Complaints are reported by the patient now are none, no complaints of fatigue Still has no complaints of shakiness, sweating or palpitations  He has been treated with brand name SYNTHROID, the dose has been adjusted periodically based on his labs More recently has required progressively higher doses  He is now taking 175 mcg daily since 10/2017  He is taking his supplement regularly before breakfast TSH is normal as of 3/19   Wt Readings from Last 3 Encounters:  03/08/18 204 lb 6.4 oz (92.7 kg)  02/03/18 196 lb 6.4 oz (89.1 kg)  12/15/17 212 lb (96.2 kg)    LABS:    Lab Results  Component Value Date   TSH 1.25 12/02/2017   TSH 6.48 (H) 10/15/2017   TSH 4.77 (H) 08/12/2017   FREET4 1.17 12/02/2017   FREET4 0.97 10/15/2017   FREET4 1.02 08/12/2017      HYPERTENSION:  He has had long-standing hypertension which is  managed with lisinopril 40, Catapres 0.1 mg patch and Coreg 1-1/2 tablets twice a day He is not taking hydrochlorothiazide which was causing hyponatremia  Although his blood pressure has been significantly high earlier this year it appears to be much improved now Also on his last visit he was possibly taking a decongestant making his blood pressure higher  He was supposed to get evaluation for secondary hypertension but reports of his aldosterone/renin are not available He appears to have done this lab in the afternoon instead of morning Also did not get scheduled for the renal artery ultrasound  He is using unknown blood pressure monitor at home and he thinks recently  readings have been about 120/78 Work blood pressure readings are about 134/80  He did not bring his home monitor for comparison again despite reminders   BP Readings from Last 3 Encounters:  03/08/18 140/84  02/03/18 138/82  12/15/17 (!) 166/102       Allergies as of 03/08/2018      Reactions   Sulfa Drugs Cross Reactors Hives   Codeine Other (See Comments)   Made him very jittery       Medication List        Accurate as of 03/08/18  3:30 PM. Always use your most recent med list.          albuterol 108 (90 Base) MCG/ACT inhaler Commonly known as:  VENTOLIN HFA INHALE 2 PUFFS INTO THE LUNGS EVERY 6 HOURS AS NEEDED FOR WHEEZING/SHORTNESS OF BREATH   ALPRAZolam 0.5 MG tablet Commonly known as:  XANAX TAKE 1 TABLET BY MOUTH THREE TIMES A DAY   aspirin 81 MG tablet Take 81 mg by mouth every other day.   carvedilol 12.5 MG tablet Commonly known as:  COREG Take 1.5 tablets (18.75 mg total) by mouth 2 (two) times daily.   cloNIDine 0.1 mg/24hr patch Commonly known as:  CATAPRES-TTS-1 Place 1 patch (0.1 mg total) onto the skin once a week.   flecainide 100 MG tablet Commonly known as:  TAMBOCOR TAKE 1 TABLET BY MOUTH TWICE A DAY   FLUoxetine 40 MG capsule Commonly known as:  PROZAC TAKE 2 CAPSULES BY MOUTH EVERY DAY   ipratropium 0.03 % nasal spray Commonly known as:  ATROVENT USE 2 SPRAYS INTO BOTH NOSTRIL 3 TIMES DAILY   lisinopril 40 MG tablet Commonly known as:  PRINIVIL,ZESTRIL TAKE 1 TABLET BY MOUTH EVERY DAY   simvastatin 40 MG tablet Commonly known as:  ZOCOR Take 1 tablet (40 mg total) by mouth at bedtime.   SYNTHROID 175 MCG tablet Generic drug:  levothyroxine TAKE 1 TABLET DAILY BEFORE BREAKFAST   tamsulosin 0.4 MG Caps capsule Commonly known as:  FLOMAX Take 1 capsule (0.4 mg total) by mouth daily.       Past Medical History:  Diagnosis Date  . Allergy   . Anxiety   . Atrial fibrillation ([place])   . Bell palsy   . Depression   .  Diastolic heart failure   . Hypercholesterolemia   . Hypertension   . Pneumonia   . Thyroid disease     Past Surgical History:  Procedure Laterality Date  . CHOLECYSTECTOMY    . HERNIA REPAIR    . KNEE SURGERY    . VASECTOMY      Family History  Problem Relation Age of Onset  . Heart disease Mother   . Heart attack Mother   . Other Mother        thyroid problems  . Kidney cancer Mother   . Cirrhosis Father   . Other Father        thyroid problems  . Alcohol abuse Father   . Hypertension Father   . Mental illness Brother   . Parkinsonism Brother     Social History:  reports that he has been smoking cigarettes.  He has a 30.00 pack-year smoking history. He has never used smokeless tobacco. He reports that he drinks alcohol. He reports that he does not use drugs.  Allergies:  Allergies  Allergen Reactions  . Sulfa Drugs Cross Reactors Hives  . Codeine Other (See Comments)    Made him very jittery     REVIEW of systems:   HYPONATREMIA: His sodium is improved but still not back to normal  Lab Results  Component Value Date   NA 136 02/03/2018   K 4.1 02/03/2018   CL 101 02/03/2018   CO2 29 02/03/2018       Examination:   BP 140/84 (BP Location: Left Arm, Patient Position: Sitting, Cuff Size: Normal)   Pulse 89   Ht 5\' 9"  (1.753 m)   Wt 204 lb 6.4 oz (92.7 kg)   SpO2 98%   BMI 30.18 kg/m       Assessments    HYPERTENSION:  His blood pressure is relatively better and near normal the last 2 times Not clear if he has whitecoat syndrome as he does need to bring his home monitor for comparison  Will review results of his aldosterone/renin results and evaluate further if needed Also continue to follow-up with PCP  Hypothyroidism: Will check labs again on the next visit    [name] 03/08/2018, 3:30 PM

## 2018-03-11 ENCOUNTER — Other Ambulatory Visit: Payer: Self-pay | Admitting: Physician Assistant

## 2018-03-14 DIAGNOSIS — R972 Elevated prostate specific antigen [PSA]: Secondary | ICD-10-CM | POA: Diagnosis not present

## 2018-03-18 LAB — ALDOSTERONE + RENIN ACTIVITY W/ RATIO
ALDOS/RENIN RATIO: 2.7 (ref 0.0–30.0)
ALDOSTERONE: 2.1 ng/dL (ref 0.0–30.0)
Renin: 0.77 ng/mL/hr (ref 0.167–5.380)

## 2018-03-23 DIAGNOSIS — R972 Elevated prostate specific antigen [PSA]: Secondary | ICD-10-CM | POA: Diagnosis not present

## 2018-04-12 ENCOUNTER — Other Ambulatory Visit: Payer: Self-pay | Admitting: Internal Medicine

## 2018-04-12 ENCOUNTER — Other Ambulatory Visit: Payer: Self-pay | Admitting: Physician Assistant

## 2018-04-12 DIAGNOSIS — I10 Essential (primary) hypertension: Secondary | ICD-10-CM

## 2018-04-13 NOTE — Telephone Encounter (Signed)
Last OV 02/03/18 Alprazolam last filled 03/14/18 #90 with 0

## 2018-04-24 ENCOUNTER — Other Ambulatory Visit: Payer: Self-pay | Admitting: Internal Medicine

## 2018-04-27 DIAGNOSIS — Z23 Encounter for immunization: Secondary | ICD-10-CM | POA: Diagnosis not present

## 2018-05-07 ENCOUNTER — Other Ambulatory Visit: Payer: Self-pay

## 2018-05-07 ENCOUNTER — Encounter (HOSPITAL_BASED_OUTPATIENT_CLINIC_OR_DEPARTMENT_OTHER): Payer: Self-pay | Admitting: Adult Health

## 2018-05-07 ENCOUNTER — Emergency Department (HOSPITAL_BASED_OUTPATIENT_CLINIC_OR_DEPARTMENT_OTHER): Payer: 59

## 2018-05-07 ENCOUNTER — Emergency Department (HOSPITAL_BASED_OUTPATIENT_CLINIC_OR_DEPARTMENT_OTHER)
Admission: EM | Admit: 2018-05-07 | Discharge: 2018-05-07 | Disposition: A | Discharge status: Home / Self Care | Payer: 59 | Attending: Emergency Medicine | Admitting: Emergency Medicine

## 2018-05-07 DIAGNOSIS — I110 Hypertensive heart disease with heart failure: Secondary | ICD-10-CM | POA: Diagnosis not present

## 2018-05-07 DIAGNOSIS — I5031 Acute diastolic (congestive) heart failure: Secondary | ICD-10-CM | POA: Diagnosis not present

## 2018-05-07 DIAGNOSIS — R1032 Left lower quadrant pain: Secondary | ICD-10-CM | POA: Diagnosis present

## 2018-05-07 DIAGNOSIS — R109 Unspecified abdominal pain: Secondary | ICD-10-CM | POA: Diagnosis not present

## 2018-05-07 DIAGNOSIS — F17210 Nicotine dependence, cigarettes, uncomplicated: Secondary | ICD-10-CM | POA: Insufficient documentation

## 2018-05-07 DIAGNOSIS — E039 Hypothyroidism, unspecified: Secondary | ICD-10-CM | POA: Insufficient documentation

## 2018-05-07 DIAGNOSIS — R197 Diarrhea, unspecified: Secondary | ICD-10-CM

## 2018-05-07 DIAGNOSIS — Z79899 Other long term (current) drug therapy: Secondary | ICD-10-CM | POA: Insufficient documentation

## 2018-05-07 DIAGNOSIS — R1084 Generalized abdominal pain: Secondary | ICD-10-CM | POA: Diagnosis not present

## 2018-05-07 DIAGNOSIS — R05 Cough: Secondary | ICD-10-CM | POA: Insufficient documentation

## 2018-05-07 DIAGNOSIS — Z982 Presence of cerebrospinal fluid drainage device: Secondary | ICD-10-CM | POA: Diagnosis not present

## 2018-05-07 DIAGNOSIS — R059 Cough, unspecified: Secondary | ICD-10-CM

## 2018-05-07 LAB — COMPREHENSIVE METABOLIC PANEL
ALT: 20 U/L (ref 0–44)
AST: 20 U/L (ref 15–41)
Albumin: 3.7 g/dL (ref 3.5–5.0)
Alkaline Phosphatase: 87 U/L (ref 38–126)
Anion gap: 9 (ref 5–15)
BUN: <5 mg/dL — ABNORMAL LOW (ref 6–20)
CO2: 28 mmol/L (ref 22–32)
Calcium: 8.6 mg/dL — ABNORMAL LOW (ref 8.9–10.3)
Chloride: 103 mmol/L (ref 98–111)
Creatinine, Ser: 0.97 mg/dL (ref 0.61–1.24)
GFR calc Af Amer: >60 mL/min (ref >60)
GFR calc non Af Amer: >60 mL/min (ref >60)
Glucose, Bld: 82 mg/dL (ref 70–99)
Potassium: 3.7 mmol/L (ref 3.5–5.1)
Sodium: 140 mmol/L (ref 135–145)
Total Bilirubin: 0.6 mg/dL (ref 0.3–1.2)
Total Protein: 6.7 g/dL (ref 6.5–8.1)

## 2018-05-07 LAB — URINALYSIS, ROUTINE W REFLEX MICROSCOPIC
Bilirubin Urine: NEGATIVE
Glucose, UA: NEGATIVE mg/dL
Hgb urine dipstick: NEGATIVE
Ketones, ur: NEGATIVE mg/dL
Leukocytes, UA: NEGATIVE
Nitrite: NEGATIVE
Protein, ur: NEGATIVE mg/dL
Specific Gravity, Urine: <1.005 — ABNORMAL LOW (ref 1.005–1.030)
pH: 5.5 (ref 5.0–8.0)

## 2018-05-07 LAB — CBC
HCT: 45.1 % (ref 39.0–52.0)
Hemoglobin: 15.4 g/dL (ref 13.0–17.0)
MCH: 31.2 pg (ref 26.0–34.0)
MCHC: 34.1 g/dL (ref 30.0–36.0)
MCV: 91.5 fL (ref 78.0–100.0)
Platelets: 241 10*3/uL (ref 150–400)
RBC: 4.93 MIL/uL (ref 4.22–5.81)
RDW: 13.7 % (ref 11.5–15.5)
WBC: 11 10*3/uL — ABNORMAL HIGH (ref 4.0–10.5)

## 2018-05-07 LAB — LIPASE, BLOOD: Lipase: 28 U/L (ref 11–51)

## 2018-05-07 LAB — OCCULT BLOOD X 1 CARD TO LAB, STOOL: Fecal Occult Bld: NEGATIVE

## 2018-05-07 MED ORDER — ONDANSETRON HCL 4 MG/2ML IJ SOLN: 4.0000 mg | Freq: Once | INTRAMUSCULAR | Status: DC

## 2018-05-07 MED ORDER — MORPHINE SULFATE (PF) 4 MG/ML IV SOLN: 4.0000 mg | Freq: Once | INTRAVENOUS | Status: DC

## 2018-05-07 MED ORDER — KETOROLAC TROMETHAMINE 30 MG/ML IJ SOLN
30.0000 mg | Freq: Once | INTRAMUSCULAR | Status: AC
  Administered 2018-05-07: 30 mg via INTRAVENOUS
  Filled 2018-05-07: qty 1

## 2018-05-07 MED ORDER — SODIUM CHLORIDE 0.9 % IV BOLUS
500.0000 mL | Freq: Once | INTRAVENOUS | Status: AC
  Administered 2018-05-07: 500 mL via INTRAVENOUS

## 2018-05-07 MED ORDER — IOPAMIDOL (ISOVUE-300) INJECTION 61%
100.0000 mL | Freq: Once | INTRAVENOUS | Status: AC | PRN
  Administered 2018-05-07: 100 mL via INTRAVENOUS

## 2018-05-07 MED ORDER — ALBUTEROL SULFATE HFA 108 (90 BASE) MCG/ACT IN AERS
2.0000 | INHALATION_SPRAY | RESPIRATORY_TRACT | Status: DC | PRN
  Administered 2018-05-07: 2 via RESPIRATORY_TRACT
  Filled 2018-05-07: qty 6.7

## 2018-05-07 NOTE — ED Triage Notes (Signed)
Presents with lower abdominal pain, black diarrhea, nausea and vomiting and frequent urination that began 3-4 days ago, associated with chills.  Endorses vomiting phlegm about 6-7 times. Pain is described as cutting in his lower abdomen.

## 2018-05-07 NOTE — Discharge Instructions (Signed)
Your blood work, chest xray, abdominal CT scan were reassuring.   Your symptoms are likely due to a viral stomach flu. Please stay hydrated with plenty of fluids. Stick to a bland diet, no spicy or greasy foods. Take tylenol at home as needed for stomach pain.   Contact your regular doctor on Monday for a new nebulizer machine. Use albuterol inhaler as needed for wheezing.   Return to the ER if you have any new or concerning symptoms like worsening abdominal pain, vomiting that does not stop, fever >100.15F, trouble breathing or chest pain.

## 2018-05-07 NOTE — ED Provider Notes (Signed)
[facility] Provider Note   CSN: [id number] Arrival date & time: 05/07/18  1308     History   Chief Complaint Chief Complaint  Patient presents with  . Abdominal Pain    HPI MACINTYRE ALEXA [name] is a 57 y.o. male.  HPI  Tajon Moring is a 57 year old male with a history of hypertension, hyperlipidemia, diastolic heart failure, paroxysmal atrial fibrillation, hypothyroidism, diverticulitis who presents to the emergency department for evaluation of left lower quadrant abdominal pain, nausea, diarrhea and productive cough.  Patient reports that he started feeling unwell 3 days ago and believes he may be having a diverticulitis flare.  States that his last flare was about 2 years ago and felt similar.  He reports intermittent left lower quadrant and suprapubic pain which is 10/10 in severity and feels "like knives cutting into me."  No apparent trigger to the pain.  He has not tried any over the counter medications for his symptoms.  He also reports about 4-5 dark black bowel movements daily.  No recent antibiotic use, hospitalization or travel outside of the country.  He has had chills at home.  States that it is felt nauseated, vomits up brown/green sputum.  He also reports that he has had a cough.  Reports that he has a nebulizer for history of bronchitis, but that stopped working couple days ago.  He denies shortness of breath, wheezing, chest pain.  Reports that he has one close contact with similar symptoms of a chest cold.  He has had prior cholecystectomy and hernia repair surgery.  He does not take a blood thinner.  Denies dysuria, urinary frequency, testicular pain or flank pain, hematochezia, vomiting, lightheadedness or syncope.  His last colonoscopy was about 10 years ago and he reports it was normal.  Past Medical History:  Diagnosis Date  . Allergy   . Anxiety   . Atrial fibrillation ([place])   . Bell palsy   . Depression   . Diastolic heart  failure   . Hypercholesterolemia   . Hypertension   . Pneumonia   . Thyroid disease     Patient Active Problem List   Diagnosis Date Noted  . Visit for preventive health examination 02/03/2018  . Prostate cancer screening 02/03/2018  . Colon cancer screening 02/03/2018  . Need for Tdap vaccination 02/03/2018  . Tick bite of abdomen 02/03/2018  . Ventricular trigeminy 01/06/2017  . Bacterial sinusitis 12/08/2016  . Bone neoplasm 02/25/2015  . History of atrial fibrillation 10/16/2014  . Nicotine addiction 08/07/2013  . Depression with anxiety 02/20/2012  . ED (erectile dysfunction) 02/20/2012  . Hx of Bell's palsy 02/20/2012  . Amputated toe ([place]) 02/20/2012  . Hypothyroidism following radioiodine therapy 01/15/2011  . Hypertension 01/15/2011  . Dyslipidemia 01/15/2011    Past Surgical History:  Procedure Laterality Date  . CHOLECYSTECTOMY    . HERNIA REPAIR    . KNEE SURGERY    . VASECTOMY          Home Medications    Prior to Admission medications   Medication Sig Start Date End Date Taking? Authorizing Provider  albuterol (VENTOLIN HFA) 108 (90 Base) MCG/ACT inhaler INHALE 2 PUFFS INTO THE LUNGS EVERY 6 HOURS AS NEEDED FOR WHEEZING/SHORTNESS OF BREATH 09/24/17   Brunetta Jeans, PA-C  ALPRAZolam [name] 0.5 MG tablet TAKE 1 TABLET BY MOUTH THREE TIMES A DAY 04/13/18   Brunetta Jeans, PA-C  aspirin 81 MG tablet Take 81 mg by mouth every other  day.     [provider]  carvedilol (COREG) 12.5 MG tablet TAKE 1.5 TABLETS (18.75 MG TOTAL) BY MOUTH 2 (TWO) TIMES DAILY. 04/13/18   Evans Lance, MD  cloNIDine (CATAPRES-TTS-1) 0.1 mg/24hr patch Place 1 patch (0.1 mg total) onto the skin once a week. 07/05/17   [name], MD  flecainide (TAMBOCOR) 100 MG tablet TAKE 1 TABLET BY MOUTH TWICE A DAY 04/25/18   Evans Lance, MD  FLUoxetine (PROZAC) 40 MG capsule TAKE 2 CAPSULES BY MOUTH EVERY DAY 12/15/17   Raiford Noble C, PA-C  ipratropium (ATROVENT) 0.03 %  nasal spray USE 2 SPRAYS INTO BOTH NOSTRIL 3 TIMES DAILY 08/17/17   Brunetta Jeans, PA-C  lisinopril (PRINIVIL,ZESTRIL) 40 MG tablet TAKE 1 TABLET BY MOUTH EVERY DAY 02/08/18   Brunetta Jeans, PA-C  simvastatin (ZOCOR) 40 MG tablet Take 1 tablet (40 mg total) by mouth at bedtime. 02/04/18   Brunetta Jeans, PA-C  SYNTHROID 175 MCG tablet TAKE 1 TABLET DAILY BEFORE BREAKFAST 02/16/18   [name], MD  tamsulosin (FLOMAX) 0.4 MG CAPS capsule Take 1 capsule (0.4 mg total) by mouth daily. 02/04/18   Brunetta Jeans, PA-C    Family History Family History  Problem Relation Age of Onset  . Heart disease Mother   . Heart attack Mother   . Other Mother        thyroid problems  . Kidney cancer Mother   . Cirrhosis Father   . Other Father        thyroid problems  . Alcohol abuse Father   . Hypertension Father   . Mental illness Brother   . Parkinsonism Brother     Social History Social History   Tobacco Use  . Smoking status: Current Every Day Smoker    Packs/day: 1.00    Years: 30.00    Pack years: 30.00    Types: Cigarettes  . Smokeless tobacco: Never Used  Substance Use Topics  . Alcohol use: Yes    Alcohol/week: 0.0 standard drinks    Comment: rarely  . Drug use: No     Allergies   Sulfa drugs cross reactors and Codeine   Review of Systems Review of Systems  Constitutional: Positive for chills.  HENT: Negative for sore throat and trouble swallowing.   Eyes: Negative for visual disturbance.  Respiratory: Positive for cough. Negative for shortness of breath and wheezing.   Cardiovascular: Negative for chest pain.  Gastrointestinal: Positive for abdominal pain, diarrhea, nausea and vomiting (vomiting sputum). Negative for blood in stool and constipation.  Genitourinary: Negative for difficulty urinating, dysuria, flank pain, frequency, hematuria, scrotal swelling and testicular pain.  Musculoskeletal: Negative for back pain and gait problem.  Skin: Negative for  rash.  Neurological: Negative for dizziness, syncope, weakness, light-headedness and headaches.  Psychiatric/Behavioral: Negative for agitation.     Physical Exam Updated Vital Signs BP 126/73 (BP Location: Left Arm)   Pulse (!) 55   Temp 97.9 F (36.6 C) (Oral)   Resp 16   SpO2 97%   Physical Exam  Constitutional: He is oriented to person, place, and time. He appears well-developed and well-nourished. No distress.  Nontoxic-appearing.  HENT:  Head: Normocephalic and atraumatic.  Mouth/Throat: Oropharynx is clear and moist.  Mucous membranes moist.  Eyes: Pupils are equal, round, and reactive to light. Conjunctivae are normal. Right eye exhibits no discharge. Left eye exhibits no discharge.  Neck: Normal range of motion. Neck supple.  Cardiovascular: Normal rate and  regular rhythm.  Pulmonary/Chest: Effort normal and breath sounds normal. No stridor. No respiratory distress. He has no wheezes. He has no rales.  No respiratory distress.  Speaking in full sentences.  Lungs clear to auscultation.  Abdominal:  Abdomen soft and nondistended. Generally tender to palpation throughout the abdomen, although worse in the LLQ. No guarding or rigidity. No rebound tenderness. No rebound tenderness.   Genitourinary:  Genitourinary Comments: Chaperone present for exam. No gross blood noted on rectal exam, normal tone, no tenderness, no mass or fissure, no hemorrhoids noted.  Musculoskeletal: Normal range of motion.  Neurological: He is alert and oriented to person, place, and time. Coordination normal.  Skin: Skin is warm and dry. He is not diaphoretic.  Psychiatric: He has a normal mood and affect. His behavior is normal.  Nursing note and vitals reviewed.    ED Treatments / Results  Labs (all labs ordered are listed, but only abnormal results are displayed) Labs Reviewed  COMPREHENSIVE METABOLIC PANEL - Abnormal; Notable for the following components:      Result Value   BUN <5 (*)     Calcium 8.6 (*)    All other components within normal limits  CBC - Abnormal; Notable for the following components:   WBC 11.0 (*)    All other components within normal limits  URINALYSIS, ROUTINE W REFLEX MICROSCOPIC - Abnormal; Notable for the following components:   Specific Gravity, Urine <1.005 (*)    All other components within normal limits  LIPASE, BLOOD  OCCULT BLOOD X 1 CARD TO LAB, STOOL  POC OCCULT BLOOD, ED    EKG None  Radiology Dg Chest 2 View  Result Date: 05/07/2018 CLINICAL DATA:  Emesis and productive cough for 3 days EXAM: CHEST - 2 VIEW COMPARISON:  01/18/2017 FINDINGS: Mildly elevated right hemidiaphragm. Stable slight blunting of the right posterior costophrenic angle. A slight double contour of the right hemidiaphragm is observed on the frontal projection. There is no similar finding on the lateral projection. I suspect that this may be artifactual. The lack of similar findings on the lateral projection argue against entity such as pneumoperitoneum. Mild interstitial accentuation, as can commonly be encountered in smokers. Mild thoracic spondylosis. Tapering of the peripheral pulmonary vasculature favors emphysema. IMPRESSION: 1. No acute findings. 2. Emphysema. Mild interstitial accentuation, as can commonly be encountered in smokers. 3. Chronic mild elevation of the right hemidiaphragm. Electronically Signed   By: Van Clines M.D.   On: 05/07/2018 15:06   Ct Abdomen Pelvis W Contrast  Result Date: 05/07/2018 CLINICAL DATA:  Lower abdominal pain x3 days EXAM: CT ABDOMEN AND PELVIS WITH CONTRAST TECHNIQUE: Multidetector CT imaging of the abdomen and pelvis was performed using the standard protocol following bolus administration of intravenous contrast. CONTRAST:  128mL ISOVUE-300 IOPAMIDOL (ISOVUE-300) INJECTION 61% COMPARISON:  02/20/2016 FINDINGS: Lower chest: Mild right basilar scarring with trace pleural effusion, chronic. Hepatobiliary: Mild parenchymal  calcification along the inferior right liver. Liver is otherwise within normal limits. Status post cholecystectomy. No intrahepatic or extrahepatic ductal dilatation. Pancreas: Within normal limits. Spleen: Within normal limits. Adrenals/Urinary Tract: Adrenal glands are within normal limits. 3.0 cm medial right upper pole renal cyst (series 2/image 40). Left kidney is within normal limits. No hydronephrosis. Bladder is mildly thick-walled although underdistended. Stomach/Bowel: Stomach is within normal limits. No evidence of bowel obstruction. Normal appendix (series 2/image 30). Mild sigmoid diverticulosis, without associated inflammatory changes. Vascular/Lymphatic: No evidence of abdominal aortic aneurysm. Atherosclerotic calcifications of the abdominal aorta and branch  vessels. No suspicious abdominopelvic lymphadenopathy. Reproductive: Prostate is unremarkable. Other: No abdominopelvic ascites. Musculoskeletal: Degenerative changes of the visualized thoracolumbar spine. IMPRESSION: No evidence of bowel obstruction.  Normal appendix. Mild sigmoid diverticulosis, without evidence of diverticulitis. No CT findings to account for the patient's lower abdominal pain. Electronically Signed   By: Julian Hy M.D.   On: 05/07/2018 17:01    Procedures Procedures (including critical care time)  Medications Ordered in ED Medications  sodium chloride 0.9 % bolus 500 mL ( Intravenous Stopped 05/07/18 1448)  ketorolac (TORADOL) 30 MG/ML injection 30 mg (30 mg Intravenous Given 05/07/18 1600)  iopamidol (ISOVUE-300) 61 % injection 100 mL (100 mLs Intravenous Contrast Given 05/07/18 1638)     Initial Impression / Assessment and Plan / ED Course  I have reviewed the triage vital signs and the nursing notes.  Pertinent labs & imaging results that were available during my care of the patient were reviewed by me and considered in my medical decision making (see chart for details).     Patient presents to  the emergency department for evaluation of left lower quadrant pain, vomiting, diarrhea and cough.  No sick contacts with similar symptoms.  On exam he is afebrile and nontoxic-appearing.  His abdomen is soft and generally tender to palpation without focal tenderness.  No peritoneal signs and I am not concerned for acute surgical abdomen.  Rectal exam without occult blood.  Lungs clear to auscultation.  No signs of dehydration.    Lab work reviewed.  He has a mild leukocytosis with WBC 11.0.  CMP unremarkable, creatinine and liver enzymes WNL.  Lipase negative.  UA without evidence of infection.  Occult blood card negative.  CT abdomen/pelvis without acute intra-abdominal abnormality.  Chest x-ray reveals findings consistent with emphysema, no pneumonia.  On recheck, patient reports complete improvement in his symptoms after being given Toradol.  His symptoms are likely related to a viral gastroenteritis given generalized abdominal pain, vomiting and diarrhea.  No recent hospitalization, antibiotic use or C. difficile risk factors and I do not think stool testing is indicated at this time.  He is able to tolerate p.o. fluids at the bedside.  I have counseled him on BRAT diet and Tylenol at home as needed for pain.  Patient requesting prescription for nebulizer machine. I have counseled him that he will need to contact his PCP Monday morning for this, I did fill his albuterol inhaler in the ED. No wheezing on exam, and patient denies SOB therefore doubt COPD exacerbation. I discussed return precautions and he agrees and appears reliable.  Final Clinical Impressions(s) / ED Diagnoses   Final diagnoses:  Generalized abdominal discomfort  Diarrhea, unspecified type  Cough    ED Discharge Orders    None       Glyn Ade, PA-C 05/08/18 0050    Sherwood Gambler, MD 05/08/18 [phone]

## 2018-05-07 NOTE — ED Notes (Signed)
Patient transported to CT 

## 2018-05-07 NOTE — ED Notes (Signed)
Pt requested pain medicine "I can drive with". PA notified

## 2018-05-07 NOTE — ED Notes (Signed)
Pt/family verbalized understanding of discharge instructions.   

## 2018-05-12 ENCOUNTER — Other Ambulatory Visit: Payer: Self-pay | Admitting: Physician Assistant

## 2018-05-12 NOTE — Telephone Encounter (Signed)
Last OV 02/03/18, No future OV  Last filled 04/13/18, # 90 with 0 refills

## 2018-05-22 ENCOUNTER — Other Ambulatory Visit: Payer: Self-pay | Admitting: Physician Assistant

## 2018-06-07 ENCOUNTER — Ambulatory Visit (INDEPENDENT_AMBULATORY_CARE_PROVIDER_SITE_OTHER): Payer: 59 | Admitting: Internal Medicine

## 2018-06-07 ENCOUNTER — Encounter: Payer: Self-pay | Admitting: Internal Medicine

## 2018-06-07 VITALS — BP 136/82 | HR 59 | Ht 69.0 in | Wt 204.2 lb

## 2018-06-07 DIAGNOSIS — I493 Ventricular premature depolarization: Secondary | ICD-10-CM | POA: Diagnosis not present

## 2018-06-07 DIAGNOSIS — I10 Essential (primary) hypertension: Secondary | ICD-10-CM

## 2018-06-07 DIAGNOSIS — I480 Paroxysmal atrial fibrillation: Secondary | ICD-10-CM

## 2018-06-07 NOTE — Patient Instructions (Signed)

## 2018-06-07 NOTE — Progress Notes (Signed)
HPI Mr. Goodner returns today for followup of his PVC's. He is a 57 yo man with persistent atrial fib due to thyrotoxicosis which has resolved. He developed PVC's and has had difficult to control HTN. The patient was placed on flecainide to control his PVC's and he underwent repeat 24 hour holter which demonstrated a marked reduction of PVC's from over 20K over 24 hours to less than one thousand PVC's in 24 hours. He admits to sodium indiscretion. No syncope or chest pain. He has not had any symptomatic arrhythmias whatsoever. Allergies  Allergen Reactions  . Sulfa Drugs Cross Reactors Hives  . Codeine Other (See Comments)    Made him very jittery      Current Outpatient Medications  Medication Sig Dispense Refill  . albuterol (VENTOLIN HFA) 108 (90 Base) MCG/ACT inhaler INHALE 2 PUFFS INTO THE LUNGS EVERY 6 HOURS AS NEEDED FOR WHEEZING/SHORTNESS OF BREATH 18 Inhaler 5  . ALPRAZolam (XANAX) 0.5 MG tablet TAKE 1 TABLET BY MOUTH THREE TIMES A DAY 90 tablet 0  . aspirin 81 MG tablet Take 81 mg by mouth every other day.     . carvedilol (COREG) 12.5 MG tablet TAKE 1.5 TABLETS (18.75 MG TOTAL) BY MOUTH 2 (TWO) TIMES DAILY. 270 tablet 2  . cloNIDine (CATAPRES-TTS-1) 0.1 mg/24hr patch Place 1 patch (0.1 mg total) onto the skin once a week. 4 patch 12  . flecainide (TAMBOCOR) 100 MG tablet TAKE 1 TABLET BY MOUTH TWICE A DAY 180 tablet 1  . FLUoxetine (PROZAC) 40 MG capsule TAKE 2 CAPSULES BY MOUTH EVERY DAY 180 capsule 1  . ipratropium (ATROVENT) 0.03 % nasal spray USE 2 SPRAYS INTO BOTH NOSTRIL 3 TIMES DAILY 90 mL 3  . lisinopril (PRINIVIL,ZESTRIL) 40 MG tablet TAKE 1 TABLET BY MOUTH EVERY DAY 90 tablet 2  . simvastatin (ZOCOR) 40 MG tablet Take 1 tablet (40 mg total) by mouth at bedtime. 90 tablet 1  . SYNTHROID 175 MCG tablet TAKE 1 TABLET DAILY BEFORE BREAKFAST 30 tablet 3  . tamsulosin (FLOMAX) 0.4 MG CAPS capsule Take 1 capsule (0.4 mg total) by mouth daily. 90 capsule 1   No current  facility-administered medications for this visit.      Past Medical History:  Diagnosis Date  . Allergy   . Anxiety   . Atrial fibrillation ([place])   . Bell palsy   . Depression   . Diastolic heart failure   . Hypercholesterolemia   . Hypertension   . Pneumonia   . Thyroid disease     ROS:   All systems reviewed and negative except as noted in the HPI.   Past Surgical History:  Procedure Laterality Date  . CHOLECYSTECTOMY    . HERNIA REPAIR    . KNEE SURGERY    . VASECTOMY       Family History  Problem Relation Age of Onset  . Heart disease Mother   . Heart attack Mother   . Other Mother        thyroid problems  . Kidney cancer Mother   . Cirrhosis Father   . Other Father        thyroid problems  . Alcohol abuse Father   . Hypertension Father   . Mental illness Brother   . Parkinsonism Brother      Social History   Socioeconomic History  . Marital status: Married    Spouse name: Not on file  . Number of children: 1  . Years of  education: Not on file  . Highest education level: Not on file  Occupational History  . Not on file  Social Needs  . Financial resource strain: Not on file  . Food insecurity:    Worry: Not on file    Inability: Not on file  . Transportation needs:    Medical: Not on file    Non-medical: Not on file  Tobacco Use  . Smoking status: Current Every Day Smoker    Packs/day: 1.00    Years: 30.00    Pack years: 30.00    Types: Cigarettes  . Smokeless tobacco: Never Used  Substance and Sexual Activity  . Alcohol use: Yes    Alcohol/week: 0.0 standard drinks    Comment: rarely  . Drug use: No  . Sexual activity: Never    Birth control/protection: None  Lifestyle  . Physical activity:    Days per week: Not on file    Minutes per session: Not on file  . Stress: Not on file  Relationships  . Social connections:    Talks on phone: Not on file    Gets together: Not on file    Attends religious service: Not on file     Active member of club or organization: Not on file    Attends meetings of clubs or organizations: Not on file    Relationship status: Not on file  . Intimate partner violence:    Fear of current or ex partner: Not on file    Emotionally abused: Not on file    Physically abused: Not on file    Forced sexual activity: Not on file  Other Topics Concern  . 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      Tobacco: 1 ppd x 30 years      Alcohol: rare     Regular exercise: walking daily   Caffeine use: daily        BP 136/82   Pulse (!) 59   Ht 5\' 9"  (1.753 m)   Wt 204 lb 3.2 oz (92.6 kg)   SpO2 97%   BMI 30.16 kg/m   Physical Exam:  Well appearing NAD HEENT: Unremarkable Neck:  No JVD, no thyromegally Lymphatics:  No adenopathy Back:  No CVA tenderness Lungs:  Clear with no wheezesHEART:  Regular rate rhythm, no murmurs, no rubs, no clicks Abd:  soft, positive bowel sounds, no organomegally, no rebound, no guarding Ext:  2 plus pulses, no edema, no cyanosis, no clubbing Skin:  No rashes no nodules Neuro:  CN II through XII intact, motor grossly intact  EKG sinus brady  Assess/Plan: 1. PAF - he has had no recurrent episodes since his thyroid was taken care of. 2. HTN - his blood pressure is reasonably well controlled. No change in meds. 3. PVC's - he is much improved and no PVC's seen.  4. Tobacco abuse - he still smokes a ppd and I have asked him to wean himself off a day at a time.   Mikle Bosworth.D.

## 2018-06-12 ENCOUNTER — Other Ambulatory Visit: Payer: Self-pay | Admitting: Physician Assistant

## 2018-06-15 ENCOUNTER — Other Ambulatory Visit: Payer: Self-pay | Admitting: Physician Assistant

## 2018-06-15 ENCOUNTER — Other Ambulatory Visit: Payer: Self-pay | Admitting: Endocrinology

## 2018-06-16 NOTE — Telephone Encounter (Signed)
Xanax refill 06/13/18 #90 tid [facility]: 12/22/17 LOV: 02/03/18  Please advise

## 2018-06-17 ENCOUNTER — Other Ambulatory Visit: Payer: Self-pay | Admitting: Physician Assistant

## 2018-07-03 ENCOUNTER — Other Ambulatory Visit: Payer: Self-pay | Admitting: Endocrinology

## 2018-07-15 ENCOUNTER — Other Ambulatory Visit: Payer: 59

## 2018-07-16 ENCOUNTER — Other Ambulatory Visit: Payer: Self-pay | Admitting: Endocrinology

## 2018-07-18 ENCOUNTER — Other Ambulatory Visit: Payer: Self-pay | Admitting: Physician Assistant

## 2018-07-18 ENCOUNTER — Ambulatory Visit: Payer: 59 | Admitting: Endocrinology

## 2018-07-25 ENCOUNTER — Other Ambulatory Visit: Payer: 59

## 2018-07-26 ENCOUNTER — Other Ambulatory Visit: Payer: Self-pay | Admitting: Physician Assistant

## 2018-07-26 DIAGNOSIS — N401 Enlarged prostate with lower urinary tract symptoms: Secondary | ICD-10-CM

## 2018-07-26 DIAGNOSIS — E785 Hyperlipidemia, unspecified: Secondary | ICD-10-CM

## 2018-07-26 DIAGNOSIS — R351 Nocturia: Secondary | ICD-10-CM

## 2018-07-28 ENCOUNTER — Ambulatory Visit: Payer: 59 | Admitting: Endocrinology

## 2018-08-06 DIAGNOSIS — M545 Low back pain: Secondary | ICD-10-CM | POA: Diagnosis not present

## 2018-08-09 ENCOUNTER — Other Ambulatory Visit: Payer: Self-pay | Admitting: Endocrinology

## 2018-08-17 ENCOUNTER — Ambulatory Visit: Payer: 59 | Admitting: Physician Assistant

## 2018-08-17 ENCOUNTER — Encounter: Payer: Self-pay | Admitting: Physician Assistant

## 2018-08-17 ENCOUNTER — Other Ambulatory Visit: Payer: Self-pay

## 2018-08-17 VITALS — BP 138/84 | HR 63 | Temp 98.5°F | Resp 16 | Ht 69.0 in | Wt 210.0 lb

## 2018-08-17 DIAGNOSIS — B9689 Other specified bacterial agents as the cause of diseases classified elsewhere: Secondary | ICD-10-CM | POA: Diagnosis not present

## 2018-08-17 DIAGNOSIS — J208 Acute bronchitis due to other specified organisms: Secondary | ICD-10-CM

## 2018-08-17 MED ORDER — BENZONATATE 100 MG PO CAPS: 100.0000 mg | ORAL_CAPSULE | Freq: Three times a day (TID) | ORAL | 0 refills | Status: DC | PRN

## 2018-08-17 MED ORDER — ALBUTEROL SULFATE (2.5 MG/3ML) 0.083% IN NEBU: 2.5000 mg | INHALATION_SOLUTION | Freq: Four times a day (QID) | RESPIRATORY_TRACT | 1 refills | Status: DC | PRN

## 2018-08-17 MED ORDER — DOXYCYCLINE HYCLATE 100 MG PO CAPS: 100.0000 mg | ORAL_CAPSULE | Freq: Two times a day (BID) | ORAL | 0 refills | Status: DC

## 2018-08-17 NOTE — Progress Notes (Signed)
Patient presents to clinic today c/o chest congestion with a cough that was initially dry but now productive of green sputum. Is accompanied by some nasal congestion but no sinus pressure, sinus pain, ear pain or tooth pain. Notes fatigue. Denies fever but notes some chills. Denies recent travel. Is taking Mucinex to help with symptoms.   Past Medical History:  Diagnosis Date  . Allergy   . Anxiety   . Atrial fibrillation ([place])   . Bell palsy   . Depression   . Diastolic heart failure   . Hypercholesterolemia   . Hypertension   . Pneumonia   . Thyroid disease     Current Outpatient Medications on File Prior to Visit  Medication Sig Dispense Refill  . albuterol (VENTOLIN HFA) 108 (90 Base) MCG/ACT inhaler INHALE 2 PUFFS INTO THE LUNGS EVERY 6 HOURS AS NEEDED FOR WHEEZING/SHORTNESS OF BREATH 18 Inhaler 5  . ALPRAZolam (XANAX) 0.5 MG tablet TAKE 1 TABLET BY MOUTH THREE TIMES A DAY 90 tablet 0  . aspirin 81 MG tablet Take 81 mg by mouth every other day.     . carvedilol (COREG) 12.5 MG tablet TAKE 1.5 TABLETS (18.75 MG TOTAL) BY MOUTH 2 (TWO) TIMES DAILY. 270 tablet 2  . cloNIDine (CATAPRES - DOSED IN MG/24 HR) 0.1 mg/24hr patch PLACE 1 PATCH (0.1 MG TOTAL) ONTO THE SKIN ONCE A WEEK. 12 patch 4  . flecainide (TAMBOCOR) 100 MG tablet TAKE 1 TABLET BY MOUTH TWICE A DAY 180 tablet 1  . FLUoxetine (PROZAC) 40 MG capsule TAKE 2 CAPSULES BY MOUTH EVERY DAY 180 capsule 1  . hydrochlorothiazide (MICROZIDE) 12.5 MG capsule TAKE 1 CAPSULE BY MOUTH EVERY DAY 30 capsule 1  . ipratropium (ATROVENT) 0.03 % nasal spray USE 2 SPRAYS INTO BOTH NOSTRIL 3 TIMES DAILY 90 mL 3  . lisinopril (PRINIVIL,ZESTRIL) 40 MG tablet TAKE 1 TABLET BY MOUTH EVERY DAY 90 tablet 2  . simvastatin (ZOCOR) 40 MG tablet TAKE 1 TABLET BY MOUTH EVERYDAY AT BEDTIME 90 tablet 1  . SYNTHROID 175 MCG tablet TAKE 1 TABLET DAILY BEFORE BREAKFAST 30 tablet 3  . tamsulosin (FLOMAX) 0.4 MG CAPS capsule TAKE 1 CAPSULE BY MOUTH EVERY DAY  90 capsule 1   No current facility-administered medications on file prior to visit.     Allergies  Allergen Reactions  . Sulfa Drugs Cross Reactors Hives  . Codeine Other (See Comments)    Made him very jittery     Family History  Problem Relation Age of Onset  . Heart disease Mother   . Heart attack Mother   . Other Mother        thyroid problems  . Kidney cancer Mother   . Cirrhosis Father   . Other Father        thyroid problems  . Alcohol abuse Father   . Hypertension Father   . Mental illness Brother   . Parkinsonism Brother     Social History   Socioeconomic History  . Marital status: Married    Spouse name: Not on file  . Number of children: 1  . Years of education: Not on file  . Highest education level: Not on file  Occupational History  . Not on file  Social Needs  . Financial resource strain: Not on file  . Food insecurity:    Worry: Not on file    Inability: Not on file  . Transportation needs:    Medical: Not on file    Non-medical: Not  on file  Tobacco Use  . Smoking status: Current Every Day Smoker    Packs/day: 1.00    Years: 30.00    Pack years: 30.00    Types: Cigarettes  . Smokeless tobacco: Never Used  Substance and Sexual Activity  . Alcohol use: Yes    Alcohol/week: 0.0 standard drinks    Comment: rarely  . Drug use: No  . Sexual activity: Never    Birth control/protection: None  Lifestyle  . Physical activity:    Days per week: Not on file    Minutes per session: Not on file  . Stress: Not on file  Relationships  . Social connections:    Talks on phone: Not on file    Gets together: Not on file    Attends religious service: Not on file    Active member of club or organization: Not on file    Attends meetings of clubs or organizations: Not on file    Relationship status: Not on file  Other Topics Concern  . 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  All other ROS are negative.  BP 138/84   Pulse 63   Temp 98.5 F (36.9 C) (Oral)   Resp 16   Ht 5\' 9"  (1.753 m)   Wt 210 lb (95.3 kg)   SpO2 98%   BMI 31.01 kg/m   Physical Exam  Constitutional: He is oriented to person, place, and time. He appears well-developed and well-nourished.  HENT:  Head: Normocephalic and atraumatic.  Right Ear: External ear normal.  Left Ear: External ear normal.  Nose: Nose normal.  Mouth/Throat: Oropharynx is clear and moist. No oropharyngeal exudate.  Eyes: Conjunctivae are normal.  Neck: Neck supple.  Cardiovascular: Normal rate, regular rhythm, normal heart sounds and intact distal pulses.  Pulmonary/Chest: Effort normal and breath sounds normal. No stridor. No respiratory distress. He has no wheezes. He has no rales.  Lymphadenopathy:    He has no cervical adenopathy.  Neurological: He is alert and oriented to person, place, and time.  Psychiatric: He has a normal mood and affect.  Vitals reviewed.    Assessment/Plan: 1. Acute bacterial bronchitis Rx Doxycycline.  Increase fluids.  Rest.  Saline nasal spray.  Probiotic.  Mucinex as directed.  Humidifier in bedroom. Tessalon per orders.  Call or return to clinic if symptoms are not improving.  - doxycycline (VIBRAMYCIN) 100 MG capsule; Take 1 capsule (100 mg total) by mouth 2 (two) times daily.  Dispense: 14 capsule; Refill: 0 - benzonatate (TESSALON) 100 MG capsule; Take 1 capsule (100 mg total) by mouth 3 (three) times daily as needed.  Dispense: 30 capsule; Refill: 0    Leeanne Rio, PA-C

## 2018-08-17 NOTE — Patient Instructions (Signed)
Take antibiotic (Doxycycline) as directed.  Increase fluids.  Get plenty of rest. Use Mucinex for congestion. Use the Tessalon as directed for cough. Take a daily probiotic (I recommend Align or Culturelle, but even Activia Yogurt may be beneficial).  A humidifier placed in the bedroom may offer some relief for a dry, scratchy throat of nasal irritation.  Read information below on acute bronchitis. Please call or return to clinic if symptoms are not improving.  Acute Bronchitis Bronchitis is when the airways that extend from the windpipe into the lungs get red, puffy, and painful (inflamed). Bronchitis often causes thick spit (mucus) to develop. This leads to a cough. A cough is the most common symptom of bronchitis. In acute bronchitis, the condition usually begins suddenly and goes away over time (usually in 2 weeks). Smoking, allergies, and asthma can make bronchitis worse. Repeated episodes of bronchitis may cause more lung problems.  HOME CARE  Rest.  Drink enough fluids to keep your pee (urine) clear or pale yellow (unless you need to limit fluids as told by your doctor).  Only take over-the-counter or prescription medicines as told by your doctor.  Avoid smoking and secondhand smoke. These can make bronchitis worse. If you are a smoker, think about using nicotine gum or skin patches. Quitting smoking will help your lungs heal faster.  Reduce the chance of getting bronchitis again by:  Washing your hands often.  Avoiding people with cold symptoms.  Trying not to touch your hands to your mouth, nose, or eyes.  Follow up with your doctor as told.  GET HELP IF: Your symptoms do not improve after 1 week of treatment. Symptoms include:  Cough.  Fever.  Coughing up thick spit.  Body aches.  Chest congestion.  Chills.  Shortness of breath.  Sore throat.  GET HELP RIGHT AWAY IF:   You have an increased fever.  You have chills.  You have severe shortness of  breath.  You have bloody thick spit (sputum).  You throw up (vomit) often.  You lose too much body fluid (dehydration).  You have a severe headache.  You faint.  MAKE SURE YOU:   Understand these instructions.  Will watch your condition.  Will get help right away if you are not doing well or get worse. Document Released: 02/17/2008 Document Revised: 05/03/2013 Document Reviewed: 02/21/2013 [facility] Patient Information 2015 [place], [place]. This information is not intended to replace advice given to you by your health care provider. Make sure you discuss any questions you have with your health care provider.

## 2018-08-19 ENCOUNTER — Encounter: Payer: Self-pay | Admitting: Emergency Medicine

## 2018-08-19 ENCOUNTER — Telehealth: Payer: Self-pay | Admitting: Physician Assistant

## 2018-08-19 ENCOUNTER — Other Ambulatory Visit: Payer: Self-pay | Admitting: Physician Assistant

## 2018-08-19 NOTE — Telephone Encounter (Signed)
Last rx 07/28/18 #90 [facility]SC : 4/2/419

## 2018-08-19 NOTE — Telephone Encounter (Signed)
Per PCP ok to complete work note for work today due to illness.  Patient advised that letter/note is ready for pick up

## 2018-08-19 NOTE — Telephone Encounter (Signed)
Copied from [place] [id number]. Topic: General - Inquiry >> Aug 19, 2018 12:58 PM Selinda Flavin B, NT wrote: Reason for CRM: patient calling and would like to know if Einar Pheasant could write him a work note for today because he has to miss work due to not feeling any better. CB#: [phone] Would like to come by the office to pick.

## 2018-09-05 ENCOUNTER — Other Ambulatory Visit (INDEPENDENT_AMBULATORY_CARE_PROVIDER_SITE_OTHER): Payer: 59

## 2018-09-05 DIAGNOSIS — E890 Postprocedural hypothyroidism: Secondary | ICD-10-CM

## 2018-09-05 DIAGNOSIS — I10 Essential (primary) hypertension: Secondary | ICD-10-CM

## 2018-09-05 LAB — BASIC METABOLIC PANEL
BUN: 11 mg/dL (ref 6–23)
CO2: 29 mEq/L (ref 19–32)
Calcium: 8.6 mg/dL (ref 8.4–10.5)
Chloride: 94 mEq/L — ABNORMAL LOW (ref 96–112)
Creatinine, Ser: 0.87 mg/dL (ref 0.40–1.50)
GFR: 95.22 mL/min (ref >60.00)
Glucose, Bld: 109 mg/dL — ABNORMAL HIGH (ref 70–99)
Potassium: 3.9 mEq/L (ref 3.5–5.1)
Sodium: 128 mEq/L — ABNORMAL LOW (ref 135–145)

## 2018-09-05 LAB — T4, FREE: Free T4: 1.49 ng/dL (ref 0.60–1.60)

## 2018-09-05 LAB — TSH: TSH: 2 u[IU]/mL (ref 0.35–4.50)

## 2018-09-09 ENCOUNTER — Telehealth: Payer: Self-pay | Admitting: Endocrinology

## 2018-09-09 ENCOUNTER — Ambulatory Visit: Payer: 59 | Admitting: Endocrinology

## 2018-09-09 DIAGNOSIS — R0902 Hypoxemia: Secondary | ICD-10-CM | POA: Diagnosis not present

## 2018-09-09 DIAGNOSIS — R0789 Other chest pain: Secondary | ICD-10-CM | POA: Diagnosis not present

## 2018-09-09 DIAGNOSIS — R079 Chest pain, unspecified: Secondary | ICD-10-CM | POA: Diagnosis not present

## 2018-09-09 NOTE — Telephone Encounter (Signed)
His sodium test is very low.  He needs to reschedule her missed appointment ASAP.  In the meanwhile he needs to stop taking hydrochlorothiazide and avoid excess fluids

## 2018-09-09 NOTE — Telephone Encounter (Signed)
LMTCB

## 2018-09-09 NOTE — Progress Notes (Deleted)
Patient ID: Jesse Europe [name], male   DOB: 09-04-1961, 57 y.o.   MRN: [id number]    Reason for Appointment:  Hypothyroidism and hypertension, followup visit    History of Present Illness:   The hypothyroidism was first diagnosed in 11/13. Previously had I-131 treatment for Graves' disease He was initially started with 88 mcg but his dose needed to be increased progressively  Complaints are reported by the patient now are none, no complaints of fatigue Still has no complaints of shakiness, sweating or palpitations  He has been treated with brand name SYNTHROID, the dose has been adjusted periodically based on his labs More recently has required progressively higher doses  He is now taking 175 mcg daily since 10/2017  He is taking his supplement regularly before breakfast TSH is normal as of 3/19   Wt Readings from Last 3 Encounters:  08/17/18 210 lb (95.3 kg)  06/07/18 204 lb 3.2 oz (92.6 kg)  03/08/18 204 lb 6.4 oz (92.7 kg)    LABS:    Lab Results  Component Value Date   TSH 2.00 09/05/2018   TSH 1.25 12/02/2017   TSH 6.48 (H) 10/15/2017   FREET4 1.49 09/05/2018   FREET4 1.17 12/02/2017   FREET4 0.97 10/15/2017      HYPERTENSION:  He has had long-standing hypertension which is  managed with lisinopril 40, Catapres 0.1 mg patch and Coreg 1-1/2 tablets twice a day He is not taking hydrochlorothiazide which was causing hyponatremia  Although his blood pressure has been significantly high earlier this year it appears to be much improved now Also on his last visit he was possibly taking a decongestant making his blood pressure higher  He was supposed to get evaluation for secondary hypertension but reports of his aldosterone/renin are not available He appears to have done this lab in the afternoon instead of morning Also did not get scheduled for the renal artery ultrasound  He is using unknown blood pressure monitor at home and he thinks recently  readings have been about 120/78 Work blood pressure readings are about 134/80  He did not bring his home monitor for comparison again despite reminders   BP Readings from Last 3 Encounters:  08/17/18 138/84  06/07/18 136/82  05/07/18 126/73       Allergies as of 09/09/2018      Reactions   Sulfa Drugs Cross Reactors Hives   Codeine Other (See Comments)   Made him very jittery       Medication List       Accurate as of September 09, 2018  8:36 AM. Always use your most recent med list.        albuterol 108 (90 Base) MCG/ACT inhaler Commonly known as:  VENTOLIN HFA INHALE 2 PUFFS INTO THE LUNGS EVERY 6 HOURS AS NEEDED FOR WHEEZING/SHORTNESS OF BREATH   albuterol (2.5 MG/3ML) 0.083% nebulizer solution Commonly known as:  PROVENTIL Take 3 mLs (2.5 mg total) by nebulization every 6 (six) hours as needed for wheezing or shortness of breath.   ALPRAZolam 1 MG tablet Commonly known as:  XANAX TAKE 1/2 TABLET 3 TIMES DAILY   aspirin 81 MG tablet Take 81 mg by mouth every other day.   benzonatate 100 MG capsule Commonly known as:  TESSALON Take 1 capsule (100 mg total) by mouth 3 (three) times daily as needed.   carvedilol 12.5 MG tablet Commonly known as:  COREG TAKE 1.5 TABLETS (18.75 MG TOTAL) BY MOUTH 2 (TWO) TIMES DAILY.  cloNIDine 0.1 mg/24hr patch Commonly known as:  CATAPRES - Dosed in mg/24 hr PLACE 1 PATCH (0.1 MG TOTAL) ONTO THE SKIN ONCE A WEEK.   doxycycline 100 MG capsule Commonly known as:  VIBRAMYCIN Take 1 capsule (100 mg total) by mouth 2 (two) times daily.   flecainide 100 MG tablet Commonly known as:  TAMBOCOR TAKE 1 TABLET BY MOUTH TWICE A DAY   FLUoxetine 40 MG capsule Commonly known as:  PROZAC TAKE 2 CAPSULES BY MOUTH EVERY DAY   hydrochlorothiazide 12.5 MG capsule Commonly known as:  MICROZIDE TAKE 1 CAPSULE BY MOUTH EVERY DAY   ipratropium 0.03 % nasal spray Commonly known as:  ATROVENT USE 2 SPRAYS INTO BOTH NOSTRIL 3 TIMES  DAILY   lisinopril 40 MG tablet Commonly known as:  PRINIVIL,ZESTRIL TAKE 1 TABLET BY MOUTH EVERY DAY   simvastatin 40 MG tablet Commonly known as:  ZOCOR TAKE 1 TABLET BY MOUTH EVERYDAY AT BEDTIME   SYNTHROID 175 MCG tablet Generic drug:  levothyroxine TAKE 1 TABLET DAILY BEFORE BREAKFAST   tamsulosin 0.4 MG Caps capsule Commonly known as:  FLOMAX TAKE 1 CAPSULE BY MOUTH EVERY DAY       Past Medical History:  Diagnosis Date  . Allergy   . Anxiety   . Atrial fibrillation ([place])   . Bell palsy   . Depression   . Diastolic heart failure   . Hypercholesterolemia   . Hypertension   . Pneumonia   . Thyroid disease     Past Surgical History:  Procedure Laterality Date  . CHOLECYSTECTOMY    . HERNIA REPAIR    . KNEE SURGERY    . VASECTOMY      Family History  Problem Relation Age of Onset  . Heart disease Mother   . Heart attack Mother   . Other Mother        thyroid problems  . Kidney cancer Mother   . Cirrhosis Father   . Other Father        thyroid problems  . Alcohol abuse Father   . Hypertension Father   . Mental illness Brother   . Parkinsonism Brother     Social History:  reports that he has been smoking cigarettes. He has a 30.00 pack-year smoking history. He has never used smokeless tobacco. He reports current alcohol use. He reports that he does not use drugs.  Allergies:  Allergies  Allergen Reactions  . Sulfa Drugs Cross Reactors Hives  . Codeine Other (See Comments)    Made him very jittery     REVIEW of systems:   HYPONATREMIA: His sodium is  but still not back to normal  Lab Results  Component Value Date   NA 128 (L) 09/05/2018   K 3.9 09/05/2018   CL 94 (L) 09/05/2018   CO2 29 09/05/2018       Examination:   There were no vitals taken for this visit.      Assessments    HYPERTENSION:  His blood pressure is relatively better and near normal the last 2 times Not clear if he has whitecoat syndrome as he does need to  bring his home monitor for comparison  Will review results of his aldosterone/renin results and evaluate further if needed Also continue to follow-up with PCP  Hypothyroidism: Will check labs again on the next visit    [name] 09/09/2018, 8:36 AM

## 2018-09-12 ENCOUNTER — Ambulatory Visit: Payer: 59 | Admitting: Endocrinology

## 2018-09-12 ENCOUNTER — Encounter: Payer: Self-pay | Admitting: Endocrinology

## 2018-09-12 VITALS — BP 120/72 | HR 73 | Ht 69.0 in | Wt 206.6 lb

## 2018-09-12 DIAGNOSIS — E890 Postprocedural hypothyroidism: Secondary | ICD-10-CM

## 2018-09-12 DIAGNOSIS — I10 Essential (primary) hypertension: Secondary | ICD-10-CM | POA: Diagnosis not present

## 2018-09-12 DIAGNOSIS — E871 Hypo-osmolality and hyponatremia: Secondary | ICD-10-CM | POA: Diagnosis not present

## 2018-09-12 MED ORDER — LISINOPRIL 40 MG PO TABS: 40.0000 mg | ORAL_TABLET | Freq: Every day | ORAL | 2 refills | Status: DC

## 2018-09-12 NOTE — Patient Instructions (Addendum)
Go back to Lisinopril, stop HCTZ  Get arm [employer] BP meter

## 2018-09-12 NOTE — Progress Notes (Signed)
Patient ID: Jesse Europe [name], male   DOB: 1961/04/14, 57 y.o.   MRN: [id number]    Reason for Appointment:  Hypothyroidism and hypertension, followup visit    History of Present Illness:   The hypothyroidism was first diagnosed in 11/13. Previously had I-131 treatment for Graves' disease He was initially started with 88 mcg but his dose needed to be increased progressively  Complaints are reported by the patient now are none, no complaints of fatigue Still has no complaints of shakiness, sweating or palpitations  He has been treated with brand name SYNTHROID, the dose has been adjusted previously based on his labs  He is now taking 175 mcg daily since 10/2017  He is taking his supplement regularly before breakfast Does not complain of any unusual fatigue or weight change TSH is normal again  Wt Readings from Last 3 Encounters:  09/12/18 206 lb 9.6 oz (93.7 kg)  08/17/18 210 lb (95.3 kg)  06/07/18 204 lb 3.2 oz (92.6 kg)    LABS:   Lab Results  Component Value Date   TSH 2.00 09/05/2018   TSH 1.25 12/02/2017   TSH 6.48 (H) 10/15/2017   FREET4 1.49 09/05/2018   FREET4 1.17 12/02/2017   FREET4 0.97 10/15/2017      HYPERTENSION:  He has had long-standing hypertension which is  managed with lisinopril 40, Catapres 0.1 mg patch and Coreg 1-1/2 tablets twice a day He is now taking hydrochlorothiazide which was causing hyponatremia   He was checked for secondary hypertension but reports of his aldosterone/renin indicate normal levels Also was supposed to get the renal artery ultrasound but this was not done  He is using possibly an [employer] blood pressure monitor at home and recently readings have been about 150/80-90 However blood pressure appears to be better in the office and high reading may have been at home on stressful days, recently retiring from his job  He did not bring his home monitor for comparison again despite reminders  He was supposed to be on  lisinopril and not HCTZ and because of some news reports about carcinogenesis from certain blood pressure medications he changed his lisinopril to HCTZ on his own a couple of months ago   BP Readings from Last 3 Encounters:  09/12/18 120/72  08/17/18 138/84  06/07/18 136/82       Allergies as of 09/12/2018      Reactions   Sulfa Drugs Cross Reactors Hives   Codeine Other (See Comments)   Made him very jittery       Medication List       Accurate as of September 12, 2018  8:17 AM. Always use your most recent med list.        albuterol 108 (90 Base) MCG/ACT inhaler Commonly known as:  VENTOLIN HFA INHALE 2 PUFFS INTO THE LUNGS EVERY 6 HOURS AS NEEDED FOR WHEEZING/SHORTNESS OF BREATH   albuterol (2.5 MG/3ML) 0.083% nebulizer solution Commonly known as:  PROVENTIL Take 3 mLs (2.5 mg total) by nebulization every 6 (six) hours as needed for wheezing or shortness of breath.   ALPRAZolam 1 MG tablet Commonly known as:  XANAX TAKE 1/2 TABLET 3 TIMES DAILY   aspirin 81 MG tablet Take 81 mg by mouth every other day.   benzonatate 100 MG capsule Commonly known as:  TESSALON Take 1 capsule (100 mg total) by mouth 3 (three) times daily as needed.   carvedilol 12.5 MG tablet Commonly known as:  COREG TAKE 1.5  TABLETS (18.75 MG TOTAL) BY MOUTH 2 (TWO) TIMES DAILY.   cloNIDine 0.1 mg/24hr patch Commonly known as:  CATAPRES - Dosed in mg/24 hr PLACE 1 PATCH (0.1 MG TOTAL) ONTO THE SKIN ONCE A WEEK.   flecainide 100 MG tablet Commonly known as:  TAMBOCOR TAKE 1 TABLET BY MOUTH TWICE A DAY   FLUoxetine 40 MG capsule Commonly known as:  PROZAC TAKE 2 CAPSULES BY MOUTH EVERY DAY   hydrochlorothiazide 12.5 MG capsule Commonly known as:  MICROZIDE TAKE 1 CAPSULE BY MOUTH EVERY DAY   ipratropium 0.03 % nasal spray Commonly known as:  ATROVENT USE 2 SPRAYS INTO BOTH NOSTRIL 3 TIMES DAILY   simvastatin 40 MG tablet Commonly known as:  ZOCOR TAKE 1 TABLET BY MOUTH EVERYDAY AT  BEDTIME   SYNTHROID 175 MCG tablet Generic drug:  levothyroxine TAKE 1 TABLET DAILY BEFORE BREAKFAST   tamsulosin 0.4 MG Caps capsule Commonly known as:  FLOMAX TAKE 1 CAPSULE BY MOUTH EVERY DAY       Past Medical History:  Diagnosis Date  . Allergy   . Anxiety   . Atrial fibrillation ([place])   . Bell palsy   . Depression   . Diastolic heart failure   . Hypercholesterolemia   . Hypertension   . Pneumonia   . Thyroid disease     Past Surgical History:  Procedure Laterality Date  . CHOLECYSTECTOMY    . HERNIA REPAIR    . KNEE SURGERY    . VASECTOMY      Family History  Problem Relation Age of Onset  . Heart disease Mother   . Heart attack Mother   . Other Mother        thyroid problems  . Kidney cancer Mother   . Cirrhosis Father   . Other Father        thyroid problems  . Alcohol abuse Father   . Hypertension Father   . Mental illness Brother   . Parkinsonism Brother     Social History:  reports that he has been smoking cigarettes. He has a 30.00 pack-year smoking history. He has never used smokeless tobacco. He reports current alcohol use. He reports that he does not use drugs.  Allergies:  Allergies  Allergen Reactions  . Sulfa Drugs Cross Reactors Hives  . Codeine Other (See Comments)    Made him very jittery     REVIEW of systems:   HYPONATREMIA: His sodium is significantly low again with his going back on HCTZ Previously had been back up to 140  Currently has no nausea or drowsiness  Lab Results  Component Value Date   NA 128 (L) 09/05/2018   K 3.9 09/05/2018   CL 94 (L) 09/05/2018   CO2 29 09/05/2018       Examination:   BP 120/72 (BP Location: Left Arm, Patient Position: Sitting, Cuff Size: Normal)   Pulse 73   Ht 5\' 9"  (1.753 m)   Wt 206 lb 9.6 oz (93.7 kg)   SpO2 98%   BMI 30.51 kg/m       Assessments    HYPERTENSION:  His blood pressure is well controlled as of today Although he does better with HCTZ he is getting  hyponatremia from this  Hypothyroidism: Well-controlled with consistently normal TSH levels and able to on the same 175 dose  Hyponatremia: Currently asymptomatic, he will stop his HCTZ and follow-up in 1 month    09/12/2018, 8:17 AM

## 2018-09-21 DIAGNOSIS — J209 Acute bronchitis, unspecified: Secondary | ICD-10-CM | POA: Diagnosis not present

## 2018-09-23 ENCOUNTER — Telehealth: Payer: Self-pay | Admitting: Physician Assistant

## 2018-09-23 DIAGNOSIS — J45901 Unspecified asthma with (acute) exacerbation: Secondary | ICD-10-CM

## 2018-09-23 NOTE — Telephone Encounter (Signed)
Please advise of nebulizer machine

## 2018-09-23 NOTE — Telephone Encounter (Signed)
I have sent in the order to [facility] to get his Nebulizer.

## 2018-09-23 NOTE — Telephone Encounter (Signed)
Order printed for patient to pick up per his preference.

## 2018-09-23 NOTE — Addendum Note (Signed)
Addended by: Brunetta Jeans on: 09/23/2018 04:25 PM   Modules accepted: Orders

## 2018-09-23 NOTE — Telephone Encounter (Signed)
Pt asking for an Rx for a new nebulizer machine, pt states that his has stopped working. Please call pt when ready for pick up.

## 2018-10-06 DIAGNOSIS — H2513 Age-related nuclear cataract, bilateral: Secondary | ICD-10-CM | POA: Diagnosis not present

## 2018-10-10 ENCOUNTER — Other Ambulatory Visit: Payer: Self-pay | Admitting: Physician Assistant

## 2018-10-13 ENCOUNTER — Other Ambulatory Visit (INDEPENDENT_AMBULATORY_CARE_PROVIDER_SITE_OTHER): Payer: 59

## 2018-10-13 DIAGNOSIS — I10 Essential (primary) hypertension: Secondary | ICD-10-CM | POA: Diagnosis not present

## 2018-10-13 LAB — BASIC METABOLIC PANEL
BUN: 12 mg/dL (ref 6–23)
CO2: 29 mEq/L (ref 19–32)
Calcium: 8.7 mg/dL (ref 8.4–10.5)
Chloride: 96 mEq/L (ref 96–112)
Creatinine, Ser: 0.93 mg/dL (ref 0.40–1.50)
GFR: 82.92 mL/min (ref >60.00)
Glucose, Bld: 112 mg/dL — ABNORMAL HIGH (ref 70–99)
Potassium: 3.9 mEq/L (ref 3.5–5.1)
Sodium: 131 mEq/L — ABNORMAL LOW (ref 135–145)

## 2018-10-15 ENCOUNTER — Other Ambulatory Visit: Payer: Self-pay | Admitting: Physician Assistant

## 2018-10-17 ENCOUNTER — Encounter: Payer: Self-pay | Admitting: Endocrinology

## 2018-10-17 ENCOUNTER — Other Ambulatory Visit: Payer: Self-pay | Admitting: Physician Assistant

## 2018-10-17 ENCOUNTER — Ambulatory Visit: Payer: 59 | Admitting: Endocrinology

## 2018-10-17 VITALS — BP 140/72 | HR 62 | Ht 69.0 in | Wt 212.0 lb

## 2018-10-17 DIAGNOSIS — E871 Hypo-osmolality and hyponatremia: Secondary | ICD-10-CM

## 2018-10-17 DIAGNOSIS — I10 Essential (primary) hypertension: Secondary | ICD-10-CM

## 2018-10-17 DIAGNOSIS — E890 Postprocedural hypothyroidism: Secondary | ICD-10-CM | POA: Diagnosis not present

## 2018-10-17 NOTE — Progress Notes (Signed)
Patient ID: Jesse Europe [name], male   DOB: 03-02-1961, 58 y.o.   MRN: [id number]    Reason for Appointment: Follow-up    History of Present Illness:   The hypothyroidism was first diagnosed in 11/13. Previously had I-131 treatment for Graves' disease He was initially started with 88 mcg but his dose needed to be increased progressively  Complaints are reported by the patient now are none, no complaints of fatigue Still has no complaints of shakiness, sweating or palpitations  He has been treated with brand name SYNTHROID, the dose has been adjusted previously based on his labs  He is now taking 175 mcg daily since 10/2017  He is taking his supplement regularly before breakfast  TSH is normal again in 12/19  Wt Readings from Last 3 Encounters:  10/17/18 212 lb (96.2 kg)  09/12/18 206 lb 9.6 oz (93.7 kg)  08/17/18 210 lb (95.3 kg)    LABS:   Lab Results  Component Value Date   TSH 2.00 09/05/2018   TSH 1.25 12/02/2017   TSH 6.48 (H) 10/15/2017   FREET4 1.49 09/05/2018   FREET4 1.17 12/02/2017   FREET4 0.97 10/15/2017      HYPERTENSION:  He has had long-standing hypertension which is  managed with lisinopril 40, Catapres 0.1 mg patch and Coreg 1-1/2 tablets twice a day He is now taking hydrochlorothiazide which was causing hyponatremia  He was checked for secondary hypertension but reports of his aldosterone/renin indicate normal levels Also was supposed to get the renal artery ultrasound but this was not done  He is using possibly an [employer] blood pressure monitor at home and recently readings have been about 120/60-70 He did not bring his home monitor for comparison again despite reminders  He was supposed to be on lisinopril and not HCTZ and he had gone back to HCTZ on his own prior to his December visit Because of hyponatremia was changed back to lisinopril    BP Readings from Last 3 Encounters:  10/17/18 140/72  09/12/18 120/72  08/17/18 138/84         Allergies as of 10/17/2018      Reactions   Sulfa Drugs Cross Reactors Hives   Codeine Other (See Comments)   Made him very jittery    Hydrochlorothiazide Other (See Comments)   Hyponatremia      Medication List       Accurate as of October 17, 2018  9:27 AM. Always use your most recent med list.        albuterol (2.5 MG/3ML) 0.083% nebulizer solution Commonly known as:  PROVENTIL Take 3 mLs (2.5 mg total) by nebulization every 6 (six) hours as needed for wheezing or shortness of breath.   albuterol 108 (90 Base) MCG/ACT inhaler Commonly known as:  PROVENTIL HFA;VENTOLIN HFA INHALE 2 PUFFS INTO THE LUNGS EVERY 6 HOURS AS NEEDED FOR WHEEZING/SHORTNESS OF BREATH   ALPRAZolam 1 MG tablet Commonly known as:  XANAX TAKE 1/2 TABLET 3 TIMES DAILY   aspirin 81 MG tablet Take 81 mg by mouth every other day.   benzonatate 100 MG capsule Commonly known as:  TESSALON Take 1 capsule (100 mg total) by mouth 3 (three) times daily as needed.   carvedilol 12.5 MG tablet Commonly known as:  COREG TAKE 1.5 TABLETS (18.75 MG TOTAL) BY MOUTH 2 (TWO) TIMES DAILY.   cloNIDine 0.1 mg/24hr patch Commonly known as:  CATAPRES - Dosed in mg/24 hr PLACE 1 PATCH (0.1 MG TOTAL) ONTO THE  SKIN ONCE A WEEK.   flecainide 100 MG tablet Commonly known as:  TAMBOCOR TAKE 1 TABLET BY MOUTH TWICE A DAY   FLUoxetine 40 MG capsule Commonly known as:  PROZAC TAKE 2 CAPSULES BY MOUTH EVERY DAY   ipratropium 0.03 % nasal spray Commonly known as:  ATROVENT Place 2 sprays into both nostrils 2 (two) times daily. USE 2 SPRAYS INTO BOTH NOSTRIL 3 TIMES DAILY   lisinopril 40 MG tablet Commonly known as:  PRINIVIL,ZESTRIL Take 1 tablet (40 mg total) by mouth daily.   simvastatin 40 MG tablet Commonly known as:  ZOCOR TAKE 1 TABLET BY MOUTH EVERYDAY AT BEDTIME   SYNTHROID 175 MCG tablet Generic drug:  levothyroxine TAKE 1 TABLET DAILY BEFORE BREAKFAST   tamsulosin 0.4 MG Caps  capsule Commonly known as:  FLOMAX TAKE 1 CAPSULE BY MOUTH EVERY DAY       Past Medical History:  Diagnosis Date  . Allergy   . Anxiety   . Atrial fibrillation ([place])   . Bell palsy   . Depression   . Diastolic heart failure   . Hypercholesterolemia   . Hypertension   . Pneumonia   . Thyroid disease     Past Surgical History:  Procedure Laterality Date  . CHOLECYSTECTOMY    . HERNIA REPAIR    . KNEE SURGERY    . VASECTOMY      Family History  Problem Relation Age of Onset  . Heart disease Mother   . Heart attack Mother   . Other Mother        thyroid problems  . Kidney cancer Mother   . Cirrhosis Father   . Other Father        thyroid problems  . Alcohol abuse Father   . Hypertension Father   . Mental illness Brother   . Parkinsonism Brother     Social History:  reports that he has been smoking cigarettes. He has a 30.00 pack-year smoking history. He has never used smokeless tobacco. He reports current alcohol use. He reports that he does not use drugs.  Allergies:  Allergies  Allergen Reactions  . Sulfa Drugs Cross Reactors Hives  . Codeine Other (See Comments)    Made him very jittery   . Hydrochlorothiazide Other (See Comments)    Hyponatremia    REVIEW of systems:   HYPONATREMIA: His sodium is significantly with being on HCTZ However it is not back to normal yet even though he has been off for a month He does not feel any decreased appetite or nausea No recent weight loss  Does not consume excessive amounts of fluids   Lab Results  Component Value Date   NA 131 (L) 10/13/2018   K 3.9 10/13/2018   CL 96 10/13/2018   CO2 29 10/13/2018       Examination:   BP 140/72 (BP Location: Left Arm, Patient Position: Sitting, Cuff Size: Normal)   Pulse 62   Ht 5\' 9"  (1.753 m)   Wt 212 lb (96.2 kg)   SpO2 96%   BMI 31.31 kg/m       Assessments   Hyponatremia: Somewhat better with stopping HCTZ but still low at 131 Not clear if this is  still persistent from being on HCTZ but will need to recheck in another month or so In the meantime we will check his urine osmolality to confirm SIADH  Because of his smoking history we will recommend low-dose CT with the PCP to order  HYPERTENSION:  His blood pressure is well controlled with going back to lisinopril instead of HCTZ  Follow-up in 6 months      10/17/2018, 9:27 AM

## 2018-10-17 NOTE — Telephone Encounter (Signed)
Last refill: 08/21/18 #45,1 Last OV: 01/14/18

## 2018-10-19 ENCOUNTER — Encounter: Payer: Self-pay | Admitting: Physician Assistant

## 2018-10-19 ENCOUNTER — Other Ambulatory Visit: Payer: Self-pay | Admitting: Physician Assistant

## 2018-10-19 ENCOUNTER — Other Ambulatory Visit: Payer: Self-pay

## 2018-10-19 ENCOUNTER — Ambulatory Visit: Payer: 59 | Admitting: Physician Assistant

## 2018-10-19 ENCOUNTER — Telehealth: Payer: Self-pay | Admitting: Physician Assistant

## 2018-10-19 VITALS — BP 120/88 | HR 56 | Temp 97.9°F | Resp 14 | Ht 69.0 in | Wt 214.0 lb

## 2018-10-19 DIAGNOSIS — Z122 Encounter for screening for malignant neoplasm of respiratory organs: Secondary | ICD-10-CM

## 2018-10-19 DIAGNOSIS — F418 Other specified anxiety disorders: Secondary | ICD-10-CM

## 2018-10-19 DIAGNOSIS — N401 Enlarged prostate with lower urinary tract symptoms: Secondary | ICD-10-CM | POA: Diagnosis not present

## 2018-10-19 DIAGNOSIS — F17200 Nicotine dependence, unspecified, uncomplicated: Secondary | ICD-10-CM

## 2018-10-19 DIAGNOSIS — Z1211 Encounter for screening for malignant neoplasm of colon: Secondary | ICD-10-CM

## 2018-10-19 DIAGNOSIS — R351 Nocturia: Secondary | ICD-10-CM

## 2018-10-19 MED ORDER — ALPRAZOLAM 1 MG PO TABS: ORAL_TABLET | ORAL | 1 refills | Status: DC

## 2018-10-19 MED ORDER — VARENICLINE TARTRATE 0.5 MG X 11 & 1 MG X 42 PO MISC: ORAL | 0 refills | Status: DC

## 2018-10-19 MED ORDER — TAMSULOSIN HCL 0.4 MG PO CAPS: 0.4000 mg | ORAL_CAPSULE | Freq: Every day | ORAL | 1 refills | Status: DC

## 2018-10-19 MED ORDER — FLUOXETINE HCL 40 MG PO CAPS: 80.0000 mg | ORAL_CAPSULE | Freq: Every day | ORAL | 1 refills | Status: DC

## 2018-10-19 NOTE — Progress Notes (Signed)
Established Patient Office Visit  Subjective:  Patient ID: Jesse MIRABILE [name], male    DOB: 02-19-1961  Age: 58 y.o. MRN: [id number]  CC:  Chief Complaint  Patient presents with  . Anxiety  . Abnormal Lab    Endo wants PCP to set up CT   HPI [employer] [name] presents for follow-up of chronic medical issues.   Patient is currently on a regimen of Prozac 80 mg daily and Xanax PRN for anxiety/depression. Endorses doing very well on this regimen. Denies breakthrough symptoms. Has some upcoming procedures he is nervous about but would like to follow-up after to discuss tapering down on these medications.   Patient also on a regimen of 0.4 mg Flomax nightly for BPH symptoms. Notes overall this helps considerably. Still having to pee frequently the first couple of hours after waking. Was noted to have persistent hyponatremia but Endo despite cessation of HCTZ. Is having urine sodium and osmolality checked (in process). Is keeping hydrated.   Past Medical History:  Diagnosis Date  . Allergy   . Anxiety   . Atrial fibrillation ([place])   . Bell palsy   . Depression   . Diastolic heart failure   . Hypercholesterolemia   . Hypertension   . Pneumonia   . Thyroid disease     Past Surgical History:  Procedure Laterality Date  . CHOLECYSTECTOMY    . HERNIA REPAIR    . KNEE SURGERY    . VASECTOMY      Family History  Problem Relation Age of Onset  . Heart disease Mother   . Heart attack Mother   . Other Mother        thyroid problems  . Kidney cancer Mother   . Cirrhosis Father   . Other Father        thyroid problems  . Alcohol abuse Father   . Hypertension Father   . Mental illness Brother   . Parkinsonism Brother     Social History   Socioeconomic History  . Marital status: Married    Spouse name: Not on file  . Number of children: 1  . Years of education: Not on file  . Highest education level: Not on file  Occupational History  . Not on file  Social Needs   . Financial resource strain: Not on file  . Food insecurity:    Worry: Not on file    Inability: Not on file  . Transportation needs:    Medical: Not on file    Non-medical: Not on file  Tobacco Use  . Smoking status: Current Every Day Smoker    Packs/day: 1.00    Years: 30.00    Pack years: 30.00    Types: Cigarettes  . Smokeless tobacco: Never Used  Substance and Sexual Activity  . Alcohol use: Yes    Alcohol/week: 0.0 standard drinks    Comment: rarely  . Drug use: No  . Sexual activity: Never    Birth control/protection: None  Lifestyle  . Physical activity:    Days per week: Not on file    Minutes per session: Not on file  . Stress: Not on file  Relationships  . Social connections:    Talks on phone: Not on file    Gets together: Not on file    Attends religious service: Not on file    Active member of club or organization: Not on file    Attends meetings of clubs or organizations: Not on  file    Relationship status: Not on file  . Intimate partner violence:    Fear of current or ex partner: Not on file    Emotionally abused: Not on file    Physically abused: Not on file    Forced sexual activity: Not on file  Other Topics Concern  . 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 albuterol (PROVENTIL HFA;VENTOLIN HFA) 108 (90 Base) MCG/ACT inhaler INHALE 2 PUFFS INTO THE LUNGS EVERY 6 HOURS AS NEEDED FOR WHEEZING/SHORTNESS OF BREATH 18 Inhaler 5  . albuterol (PROVENTIL) (2.5 MG/3ML) 0.083% nebulizer solution Take 3 mLs (2.5 mg total) by nebulization every 6 (six) hours as needed for wheezing or  shortness of breath. 150 mL 1  . ALPRAZolam (XANAX) 1 MG tablet TAKE 1/2 TABLET 3 TIMES DAILY 45 tablet 1  . aspirin 81 MG tablet Take 81 mg by mouth every other day.     . carvedilol (COREG) 12.5 MG tablet TAKE 1.5 TABLETS (18.75 MG TOTAL) BY MOUTH 2 (TWO) TIMES DAILY. 270 tablet 2  . cloNIDine (CATAPRES - DOSED IN MG/24 HR) 0.1 mg/24hr patch PLACE 1 PATCH (0.1 MG TOTAL) ONTO THE SKIN ONCE A WEEK. 12 patch 4  . flecainide (TAMBOCOR) 100 MG tablet TAKE 1 TABLET BY MOUTH TWICE A DAY 180 tablet 1  . FLUoxetine (PROZAC) 40 MG capsule TAKE 2 CAPSULES BY MOUTH EVERY DAY 180 capsule 1  . ipratropium (ATROVENT) 0.03 % nasal spray Place 2 sprays into both nostrils 2 (two) times daily. USE 2 SPRAYS INTO BOTH NOSTRIL 3 TIMES DAILY 90 mL 1  . lisinopril (PRINIVIL,ZESTRIL) 40 MG tablet Take 1 tablet (40 mg total) by mouth daily. 90 tablet 2  . simvastatin (ZOCOR) 40 MG tablet TAKE 1 TABLET BY MOUTH EVERYDAY AT BEDTIME 90 tablet 1  . SYNTHROID 175 MCG tablet TAKE 1 TABLET DAILY BEFORE BREAKFAST 30 tablet 3  . tamsulosin (FLOMAX) 0.4 MG CAPS capsule TAKE 1 CAPSULE BY MOUTH EVERY DAY 90 capsule 1  . benzonatate (TESSALON) 100 MG capsule Take 1 capsule (100 mg total) by mouth 3 (three) times daily as needed. 30 capsule 0   No facility-administered medications prior to visit.     Allergies  Allergen Reactions  . Sulfa Drugs Cross Reactors Hives  . Codeine Other (See Comments)    Made him very jittery   . Hydrochlorothiazide Other (See Comments)    Hyponatremia    ROS Review of Systems Pertinent ROS are listed in HPI.   Objective:    Physical Exam  Constitutional: He appears well-developed and well-nourished.  HENT:  Head: Normocephalic and atraumatic.  Eyes: Conjunctivae are normal.  Cardiovascular: Normal rate, regular rhythm and normal heart sounds.  Pulmonary/Chest: Effort normal and breath sounds normal. No respiratory distress. He has no wheezes. He has no rales.  Psychiatric: He has a  normal mood and affect.  Vitals reviewed.   BP 120/88   Pulse (!) 56   Temp  97.9 F (36.6 C) (Oral)   Resp 14   Ht 5\' 9"  (1.753 m)   Wt 97.1 kg   SpO2 98%   BMI 31.60 kg/m  Wt Readings from Last 3 Encounters:  10/19/18 97.1 kg  10/17/18 96.2 kg  09/12/18 93.7 kg     There are no preventive care reminders to display for this patient.  There are no preventive care reminders to display for this patient.  Lab Results  Component Value Date   TSH 2.00 09/05/2018   Lab Results  Component Value Date   WBC 11.0 (H) 05/07/2018   HGB 15.4 05/07/2018   HCT 45.1 05/07/2018   MCV 91.5 05/07/2018   PLT 241 05/07/2018   Lab Results  Component Value Date   NA 131 (L) 10/13/2018   K 3.9 10/13/2018   CO2 29 10/13/2018   GLUCOSE 112 (H) 10/13/2018   BUN 12 10/13/2018   CREATININE 0.93 10/13/2018   BILITOT 0.6 05/07/2018   ALKPHOS 87 05/07/2018   AST 20 05/07/2018   ALT 20 05/07/2018   PROT 6.7 05/07/2018   ALBUMIN 3.7 05/07/2018   CALCIUM 8.7 10/13/2018   ANIONGAP 9 05/07/2018   GFR 82.92 10/13/2018   Lab Results  Component Value Date   CHOL 220 (H) 02/03/2018   Lab Results  Component Value Date   HDL 43.90 02/03/2018   Lab Results  Component Value Date   LDLCALC 147 (H) 02/03/2018   Lab Results  Component Value Date   TRIG 144.0 02/03/2018   Lab Results  Component Value Date   CHOLHDL 5 02/03/2018   Lab Results  Component Value Date   HGBA1C 5.5 02/03/2018     Assessment & Plan:   1. Encounter for screening for malignant neoplasm of respiratory organs - CT CHEST LUNG CA SCREEN LOW DOSE W/O CM; Future  2. Tobacco use disorder Will start trial of Chantix. Close follow-up scheduled. He has been encouraged to take things one day at a time.  - varenicline (CHANTIX PAK) 0.5 MG X 11 & 1 MG X 42 tablet; Take one 0.5 mg tablet by mouth once daily for 3 days, then increase to one 0.5 mg tablet twice daily for 4 days, then increase to one 1 mg tablet twice  daily.  Dispense: 53 tablet; Refill: 0  3. BPH associated with nocturia Flomax refilled. Discussed if his further workup with Endo for hyponatremia is negative, will need to discuss change in Flomax to other medication as it can contribute to lower sodium levels.  - tamsulosin (FLOMAX) 0.4 MG CAPS capsule; Take 1 capsule (0.4 mg total) by mouth daily.  Dispense: 90 capsule; Refill: 1  4. Depression with anxiety Stable. Continue current regimen.     Erin E Mecum, Student-PA

## 2018-10-19 NOTE — Patient Instructions (Addendum)
We will check on colonoscopy for you.   Please start the Chantix, taking as directed.  You will be contacted regarding your screening CT scan for lung cancer. We will call with results.  Please complete the tests requested by Dr. Dwyane Dee.   Take tylenol if needed for low back pain. Apply Icy Hot to the area when out and about. Apply heating pad for 10 minutes in the morning and afternoon (do not have on the [facility] when you do this)  Follow-up with me in 1 month.

## 2018-10-19 NOTE — Telephone Encounter (Signed)
PCP refilled the Xanax, Fluoxetine and Flomax to patient pharmacy

## 2018-10-19 NOTE — Telephone Encounter (Signed)
Copied from [place] [phone]. Topic: Quick Communication - Rx Refill/Question >> Oct 19, 2018 11:18 AM Leward Quan A wrote: Medication: ALPRAZolam [name] 1 MG tablet   Has the patient contacted their pharmacy? Yes.   (Agent: If no, request that the patient contact the pharmacy for the refill.) (Agent: If yes, when and what did the pharmacy advise?)  Preferred Pharmacy (with phone number or street name): [id number] - [place], [place] - [address] [phone] (Phone) [phone] (Fax)    Agent: Please be advised that RX refills may take up to 3 business days. We ask that you follow-up with your pharmacy.

## 2018-10-21 DIAGNOSIS — J019 Acute sinusitis, unspecified: Secondary | ICD-10-CM | POA: Diagnosis not present

## 2018-10-21 DIAGNOSIS — J209 Acute bronchitis, unspecified: Secondary | ICD-10-CM | POA: Diagnosis not present

## 2018-11-04 ENCOUNTER — Ambulatory Visit
Admission: RE | Admit: 2018-11-04 | Discharge: 2018-11-04 | Disposition: A | Discharge status: Home / Self Care | Payer: 59 | Source: Ambulatory Visit | Attending: Physician Assistant | Admitting: Physician Assistant

## 2018-11-04 DIAGNOSIS — Z122 Encounter for screening for malignant neoplasm of respiratory organs: Secondary | ICD-10-CM

## 2018-11-04 DIAGNOSIS — F17210 Nicotine dependence, cigarettes, uncomplicated: Secondary | ICD-10-CM | POA: Diagnosis not present

## 2018-11-09 ENCOUNTER — Other Ambulatory Visit: Payer: Self-pay | Admitting: *Deleted

## 2018-11-09 ENCOUNTER — Other Ambulatory Visit: Payer: Self-pay | Admitting: Physician Assistant

## 2018-11-09 DIAGNOSIS — J439 Emphysema, unspecified: Secondary | ICD-10-CM

## 2018-11-09 NOTE — Progress Notes (Signed)
Orders

## 2018-11-15 ENCOUNTER — Other Ambulatory Visit: Payer: Self-pay | Admitting: Physician Assistant

## 2018-11-15 ENCOUNTER — Other Ambulatory Visit: Payer: Self-pay | Admitting: Emergency Medicine

## 2018-11-15 DIAGNOSIS — F17200 Nicotine dependence, unspecified, uncomplicated: Secondary | ICD-10-CM

## 2018-11-15 MED ORDER — VARENICLINE TARTRATE 1 MG PO TABS: 1.0000 mg | ORAL_TABLET | Freq: Two times a day (BID) | ORAL | 1 refills | Status: DC

## 2018-11-16 ENCOUNTER — Ambulatory Visit (INDEPENDENT_AMBULATORY_CARE_PROVIDER_SITE_OTHER): Payer: 59 | Admitting: Internal Medicine

## 2018-11-16 ENCOUNTER — Other Ambulatory Visit: Payer: Self-pay | Admitting: Physician Assistant

## 2018-11-16 DIAGNOSIS — J439 Emphysema, unspecified: Secondary | ICD-10-CM | POA: Diagnosis not present

## 2018-11-16 LAB — PULMONARY FUNCTION TEST
DL/VA % pred: 67 %
DL/VA: 2.85 ml/min/mmHg/L
DLCO unc % pred: 51 %
DLCO unc: 14.42 ml/min/mmHg
FEF 25-75 Post: 0.96 L/sec
FEF 25-75 Pre: 1.18 L/sec
FEF2575-%Change-Post: -19 %
FEF2575-%Pred-Post: 31 %
FEF2575-%Pred-Pre: 39 %
FEV1-%Change-Post: -3 %
FEV1-%Pred-Post: 45 %
FEV1-%Pred-Pre: 47 %
FEV1-Post: 1.67 L
FEV1-Pre: 1.74 L
FEV1FVC-%Change-Post: -1 %
FEV1FVC-%Pred-Pre: 95 %
FEV6-%Change-Post: -2 %
FEV6-%Pred-Post: 50 %
FEV6-%Pred-Pre: 51 %
FEV6-Post: 2.32 L
FEV6-Pre: 2.37 L
FEV6FVC-%Change-Post: 0 %
FEV6FVC-%Pred-Post: 104 %
FEV6FVC-%Pred-Pre: 103 %
FVC-%Change-Post: -2 %
FVC-%Pred-Post: 48 %
FVC-%Pred-Pre: 49 %
FVC-Post: 2.34 L
FVC-Pre: 2.39 L
Post FEV1/FVC ratio: 71 %
Post FEV6/FVC ratio: 99 %
Pre FEV1/FVC ratio: 73 %
Pre FEV6/FVC Ratio: 99 %
RV % pred: 145 %
RV: 3.24 L
TLC % pred: 81 %
TLC: 5.73 L

## 2018-11-16 NOTE — Progress Notes (Signed)
PFT done today. 

## 2018-11-16 NOTE — Telephone Encounter (Signed)
Xanax last rx 10/19/18 #45 1 RF [facility]:12/14/17 UDS: none Last OV: 10/19/18 Next appt: 11/17/18

## 2018-11-17 ENCOUNTER — Ambulatory Visit: Payer: 59 | Admitting: Physician Assistant

## 2018-11-22 ENCOUNTER — Other Ambulatory Visit: Payer: Self-pay

## 2018-11-22 ENCOUNTER — Encounter: Payer: Self-pay | Admitting: Physician Assistant

## 2018-11-22 ENCOUNTER — Ambulatory Visit: Payer: 59 | Admitting: Physician Assistant

## 2018-11-22 DIAGNOSIS — Z720 Tobacco use: Secondary | ICD-10-CM | POA: Diagnosis not present

## 2018-11-22 NOTE — Progress Notes (Signed)
Patient presents to clinic today for follow-up of tobacco use disorder. At last visit, patient started on Chantix to help with smoking cessation. Endorses taking as directed and tolerating well. Has cut back from 30 cigarettes per day to 10 cigarettes per day. Is now on the continuing monthly pack. Recently had PFTS revealing severe COPD and moderate restrictive process. Has appointment upcoming with Pulmonology for further assessment and management.   Past Medical History:  Diagnosis Date  . Allergy   . Anxiety   . Atrial fibrillation ([place])   . Bell palsy   . Depression   . Diastolic heart failure   . Hypercholesterolemia   . Hypertension   . Pneumonia   . Thyroid disease     Current Outpatient Medications on File Prior to Visit  Medication Sig Dispense Refill  . albuterol (PROVENTIL HFA;VENTOLIN HFA) 108 (90 Base) MCG/ACT inhaler INHALE 2 PUFFS INTO THE LUNGS EVERY 6 HOURS AS NEEDED FOR WHEEZING/SHORTNESS OF BREATH 18 Inhaler 5  . albuterol (PROVENTIL) (2.5 MG/3ML) 0.083% nebulizer solution Take 3 mLs (2.5 mg total) by nebulization every 6 (six) hours as needed for wheezing or shortness of breath. 150 mL 1  . ALPRAZolam (XANAX) 1 MG tablet TAKE 1/2 TABLET 3 TIMES DAILY 45 tablet 1  . aspirin 81 MG tablet Take 81 mg by mouth every other day.     . carvedilol (COREG) 12.5 MG tablet TAKE 1.5 TABLETS (18.75 MG TOTAL) BY MOUTH 2 (TWO) TIMES DAILY. 270 tablet 2  . cloNIDine (CATAPRES - DOSED IN MG/24 HR) 0.1 mg/24hr patch PLACE 1 PATCH (0.1 MG TOTAL) ONTO THE SKIN ONCE A WEEK. 12 patch 4  . flecainide (TAMBOCOR) 100 MG tablet TAKE 1 TABLET BY MOUTH TWICE A DAY 180 tablet 1  . FLUoxetine (PROZAC) 40 MG capsule Take 2 capsules (80 mg total) by mouth daily. 180 capsule 1  . ipratropium (ATROVENT) 0.03 % nasal spray Place 2 sprays into both nostrils 2 (two) times daily. USE 2 SPRAYS INTO BOTH NOSTRIL 3 TIMES DAILY 90 mL 1  . lisinopril (PRINIVIL,ZESTRIL) 40 MG tablet Take 1 tablet (40 mg  total) by mouth daily. 90 tablet 2  . simvastatin (ZOCOR) 40 MG tablet TAKE 1 TABLET BY MOUTH EVERYDAY AT BEDTIME 90 tablet 1  . SYNTHROID 175 MCG tablet TAKE 1 TABLET DAILY BEFORE BREAKFAST 30 tablet 3  . tamsulosin (FLOMAX) 0.4 MG CAPS capsule Take 1 capsule (0.4 mg total) by mouth daily. 90 capsule 1  . varenicline (CHANTIX CONTINUING MONTH PAK) 1 MG tablet Take 1 tablet (1 mg total) by mouth 2 (two) times daily. Dx: F17.200 60 tablet 1   No current facility-administered medications on file prior to visit.     Allergies  Allergen Reactions  . Sulfa Drugs Cross Reactors Hives  . Codeine Other (See Comments)    Made him very jittery   . Hydrochlorothiazide Other (See Comments)    Hyponatremia    Family History  Problem Relation Age of Onset  . Heart disease Mother   . Heart attack Mother   . Other Mother        thyroid problems  . Kidney cancer Mother   . Cirrhosis Father   . Other Father        thyroid problems  . Alcohol abuse Father   . Hypertension Father   . Mental illness Brother   . Parkinsonism Brother     Social History   Socioeconomic History  . Marital status: Married  Spouse name: Not on file  . Number of children: 1  . Years of education: Not on file  . Highest education level: Not on file  Occupational History  . Not on file  Social Needs  . Financial resource strain: Not on file  . Food insecurity:    Worry: Not on file    Inability: Not on file  . Transportation needs:    Medical: Not on file    Non-medical: Not on file  Tobacco Use  . Smoking status: Current Every Day Smoker    Packs/day: 0.50    Years: 30.00    Pack years: 15.00    Types: Cigarettes  . Smokeless tobacco: Never Used  Substance and Sexual Activity  . Alcohol use: Yes    Alcohol/week: 0.0 standard drinks    Comment: rarely  . Drug use: No  . Sexual activity: Never    Birth control/protection: None  Lifestyle  . Physical activity:    Days per week: Not on file     Minutes per session: Not on file  . Stress: Not on file  Relationships  . Social connections:    Talks on phone: Not on file    Gets together: Not on file    Attends religious service: Not on file    Active member of club or organization: Not on file    Attends meetings of clubs or organizations: Not on file    Relationship status: Not on file  Other Topics Concern  . 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  All other ROS are negative.  BP 131/82   Pulse 62   Temp 98.2 F (36.8 C) (Oral)   Resp 16   Ht 5\' 9"  (1.753 m)   Wt 212 lb 4 oz (96.3 kg)   SpO2 98%   BMI 31.34 kg/m   Physical Exam Vitals signs reviewed.  HENT:     Head: Normocephalic and atraumatic.  Neck:     Musculoskeletal: Neck supple.  Cardiovascular:     Rate and Rhythm: Normal rate and regular rhythm.     Heart sounds: Normal heart sounds.  Pulmonary:     Effort: Pulmonary effort is normal.     Breath sounds: Normal breath sounds.  Psychiatric:        Mood and Affect: Mood normal.     Recent Results (from the past 2160 hour(s))  Basic metabolic panel     Status: Abnormal   Collection Time: 09/05/18  8:52 AM  Result Value Ref Range   Sodium 128 (L) 135 - 145 mEq/L   Potassium 3.9 3.5 - 5.1 mEq/L   Chloride 94 (L) 96 - 112 mEq/L   CO2 29 19 - 32 mEq/L   Glucose, Bld 109 (H) 70 - 99 mg/dL   BUN 11 6 - 23 mg/dL   Creatinine, Ser 0.87 0.40 - 1.50 mg/dL   Calcium 8.6 8.4 - 10.5 mg/dL   GFR 95.22 >60.00 mL/min  T4, free  Status: None   Collection Time: 09/05/18  8:52 AM  Result Value Ref Range   Free T4 1.49 0.60 - 1.60 ng/dL    Comment: Specimens from patients who are  undergoing biotin therapy and /or ingesting biotin supplements may contain high levels of biotin.  The higher biotin concentration in these specimens interferes with this Free T4 assay.  Specimens that contain high levels  of biotin may cause false high results for this Free T4 assay.  Please interpret results in light of the total clinical presentation of the patient.    TSH     Status: None   Collection Time: 09/05/18  8:52 AM  Result Value Ref Range   TSH 2.00 0.35 - 4.50 uIU/mL  Basic metabolic panel     Status: Abnormal   Collection Time: 10/13/18  9:08 AM  Result Value Ref Range   Sodium 131 (L) 135 - 145 mEq/L   Potassium 3.9 3.5 - 5.1 mEq/L   Chloride 96 96 - 112 mEq/L   CO2 29 19 - 32 mEq/L   Glucose, Bld 112 (H) 70 - 99 mg/dL   BUN 12 6 - 23 mg/dL   Creatinine, Ser 0.93 0.40 - 1.50 mg/dL   Calcium 8.7 8.4 - 10.5 mg/dL   GFR 82.92 >60.00 mL/min  Pulmonary function test     Status: None   Collection Time: 11/16/18  4:07 PM  Result Value Ref Range   FVC-Pre 2.39 L   FVC-%Pred-Pre 49 %   FVC-Post 2.34 L   FVC-%Pred-Post 48 %   FVC-%Change-Post -2 %   FEV1-Pre 1.74 L   FEV1-%Pred-Pre 47 %   FEV1-Post 1.67 L   FEV1-%Pred-Post 45 %   FEV1-%Change-Post -3 %   FEV6-Pre 2.37 L   FEV6-%Pred-Pre 51 %   FEV6-Post 2.32 L   FEV6-%Pred-Post 50 %   FEV6-%Change-Post -2 %   Pre FEV1/FVC ratio 73 %   FEV1FVC-%Pred-Pre 95 %   Post FEV1/FVC ratio 71 %   FEV1FVC-%Change-Post -1 %   Pre FEV6/FVC Ratio 99 %   FEV6FVC-%Pred-Pre 103 %   Post FEV6/FVC ratio 99 %   FEV6FVC-%Pred-Post 104 %   FEV6FVC-%Change-Post 0 %   FEF 25-75 Pre 1.18 L/sec   FEF2575-%Pred-Pre 39 %   FEF 25-75 Post 0.96 L/sec   FEF2575-%Pred-Post 31 %   FEF2575-%Change-Post -19 %   RV 3.24 L   RV % pred 145 %   TLC 5.73 L   TLC % pred 81 %   DLCO unc 14.42 ml/min/mmHg   DLCO unc % pred 51 %   DL/VA 2.85 ml/min/mmHg/L   DL/VA % pred 67 %    Assessment/Plan: Tobacco abuse disorder Down from 30/day to  10/day with Chantix. Just started his 1st continuing monthly pack. Will continue for 8 weeks for complete cessation. Follow-up scheduled.     Leeanne Rio, PA-C

## 2018-11-22 NOTE — Assessment & Plan Note (Signed)
Down from 30/day to 10/day with Chantix. Just started his 1st continuing monthly pack. Will continue for 8 weeks for complete cessation. Follow-up scheduled.

## 2018-11-22 NOTE — Patient Instructions (Signed)
Please continue the Chantix. We will continue for up to 2 more months (current prescription and 1 more).  Follow-up after completion if we have not completely stopped smoking!  You will be contacted for an appointment with the Pulmonologist.

## 2018-11-25 ENCOUNTER — Other Ambulatory Visit: Payer: Self-pay | Admitting: Internal Medicine

## 2018-12-07 ENCOUNTER — Other Ambulatory Visit: Payer: Self-pay | Admitting: Physician Assistant

## 2018-12-07 DIAGNOSIS — F17200 Nicotine dependence, unspecified, uncomplicated: Secondary | ICD-10-CM

## 2018-12-08 ENCOUNTER — Other Ambulatory Visit: Payer: Self-pay | Admitting: Internal Medicine

## 2018-12-08 DIAGNOSIS — I10 Essential (primary) hypertension: Secondary | ICD-10-CM

## 2018-12-13 ENCOUNTER — Institutional Professional Consult (permissible substitution): Payer: 59 | Admitting: Pulmonary Disease

## 2018-12-14 ENCOUNTER — Other Ambulatory Visit: Payer: Self-pay | Admitting: Physician Assistant

## 2018-12-15 NOTE — Telephone Encounter (Signed)
Xanax last rx 10/19/18 # 45 1 RF CSC: 12/14/17 LOV: 11/22/18  Please advise

## 2018-12-29 ENCOUNTER — Other Ambulatory Visit: Payer: Self-pay | Admitting: Physician Assistant

## 2018-12-29 DIAGNOSIS — F17200 Nicotine dependence, unspecified, uncomplicated: Secondary | ICD-10-CM

## 2019-01-16 ENCOUNTER — Other Ambulatory Visit: Payer: Self-pay | Admitting: Emergency Medicine

## 2019-01-16 DIAGNOSIS — R197 Diarrhea, unspecified: Secondary | ICD-10-CM | POA: Diagnosis not present

## 2019-01-16 DIAGNOSIS — J45901 Unspecified asthma with (acute) exacerbation: Secondary | ICD-10-CM

## 2019-01-16 DIAGNOSIS — Z1211 Encounter for screening for malignant neoplasm of colon: Secondary | ICD-10-CM | POA: Diagnosis not present

## 2019-01-17 ENCOUNTER — Telehealth: Payer: Self-pay | Admitting: Nurse Practitioner

## 2019-01-17 NOTE — Telephone Encounter (Signed)
   [facility] Pre-operative Risk Assessment    Request for surgical clearance:  1. What type of surgery is being performed? Colonoscopy  2. When is this surgery scheduled? 01/31/19   3. What type of clearance is required (medical clearance vs. Pharmacy clearance to hold med vs. Both)? Medical  4. Are there any medications that need to be held prior to surgery and how long? None Indicated   5. Practice name and name of physician performing surgery? Dr. Carol Ada @ [facility]   6. What is your office phone number [phone]   7.   What is your office fax number [phone]  8.   Anesthesia type (None, local, MAC, general) ? Propofol   Jesse Suarez 01/17/2019, 4:35 PM  _________________________________________________________________   (provider comments below)

## 2019-01-18 NOTE — Telephone Encounter (Signed)
Attempted to contact patient at both numbers listed. No option to leave a voicemail. Will attempt to contact patient again at a later time.   Abigail Butts, PA-C 01/18/19, 9:58 AM

## 2019-01-18 NOTE — Telephone Encounter (Signed)
Follow Up:    Returning call from this morning.

## 2019-01-18 NOTE — Telephone Encounter (Signed)
Patient is returning call.  °

## 2019-01-19 ENCOUNTER — Other Ambulatory Visit: Payer: Self-pay | Admitting: Endocrinology

## 2019-01-19 NOTE — Telephone Encounter (Signed)
Follow up ° ° °Patient is returning your call. Please call. ° ° ° °

## 2019-01-19 NOTE — Telephone Encounter (Signed)
Called both numbers. Left a voicemail.

## 2019-01-20 NOTE — Telephone Encounter (Signed)
   Primary Cardiologist:  Chart reviewed as part of pre-operative protocol coverage. Patient was contacted 01/20/2019 in reference to pre-operative risk assessment for pending surgery as outlined below.  Jesse JOHNSTON [name] was last seen on 06/07/2018 by Dr. Lovena Le.  Since that day, Jesse GIBBONS [name] has done well from a cardiac standpoint. He denies issues with chest pain, SOB, or palpitations. He can complete 4 METs without anginal complaints.  Therefore, based on ACC/AHA guidelines, the patient would be at acceptable risk for the planned procedure without further cardiovascular testing.   I will route this recommendation to the requesting party via Epic fax function and remove from pre-op pool.  Please call with questions.  Abigail Butts, PA-C 01/20/2019, 3:10 PM

## 2019-01-20 NOTE — Telephone Encounter (Signed)
   Attempted to contact patient again. Called both numbers and left a voicemail on cell phone listed.

## 2019-01-21 ENCOUNTER — Other Ambulatory Visit: Payer: Self-pay | Admitting: Physician Assistant

## 2019-01-21 DIAGNOSIS — E785 Hyperlipidemia, unspecified: Secondary | ICD-10-CM

## 2019-01-23 ENCOUNTER — Other Ambulatory Visit: Payer: Self-pay

## 2019-01-23 ENCOUNTER — Other Ambulatory Visit: Payer: Self-pay | Admitting: Physician Assistant

## 2019-01-23 ENCOUNTER — Ambulatory Visit: Payer: 59 | Admitting: Physician Assistant

## 2019-01-23 ENCOUNTER — Encounter: Payer: Self-pay | Admitting: Physician Assistant

## 2019-01-23 ENCOUNTER — Ambulatory Visit (INDEPENDENT_AMBULATORY_CARE_PROVIDER_SITE_OTHER): Payer: 59 | Admitting: Physician Assistant

## 2019-01-23 DIAGNOSIS — Z720 Tobacco use: Secondary | ICD-10-CM | POA: Diagnosis not present

## 2019-01-23 DIAGNOSIS — F17200 Nicotine dependence, unspecified, uncomplicated: Secondary | ICD-10-CM

## 2019-01-23 NOTE — Progress Notes (Signed)
I have discussed the procedure for the virtual visit with the patient who has given consent to proceed with assessment and treatment.    S , CMA     

## 2019-01-23 NOTE — Progress Notes (Signed)
Virtual Visit via Telephone Note  I connected with Jesse GROESBECK [name] on 01/23/19 at  1:40 PM EDT by telephone and verified that I am speaking with the correct person using two identifiers.  Location: Patient: Home Provider: [facility]   I discussed the limitations, risks, security and privacy concerns of performing an evaluation and management service by telephone and the availability of in person appointments. I also discussed with the patient that there may be a patient responsible charge related to this service. The patient expressed understanding and agreed to proceed.   History of Present Illness: Patient presents for follow-up of tobacco abuse disorder. Is currently on 2nd month of Chantix for smoking cessation. Has gotten down to 4-5 per day. Notes he is working to complete cessation. Denies any side effects of medication. Endorses restful sleep.    Observations/Objective: No labored breathing.  Speech is clear and coherent with logical content.  Patient is alert and oriented at baseline.  No audible wheeze.   Assessment and Plan: Tobacco abuse disorder Down to 5 cigarettes per day. Tolerating current regimen. Will continue Chantix up to 12 weeks. He has picked a cessation date. 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     Follow Up Instructions: Follow-up as scheduled.    I discussed the assessment and treatment plan with the patient. The patient was provided an opportunity to ask questions and all were answered. The patient agreed with the plan and demonstrated an understanding of the instructions.   The patient was advised to call back or seek an in-person evaluation if the symptoms worsen or if the condition fails to improve as anticipated.  I provided 10 minutes of non-face-to-face time during this encounter.   Leeanne Rio, PA-C

## 2019-01-23 NOTE — Assessment & Plan Note (Signed)
Down to 5 cigarettes per day. Tolerating current regimen. Will continue Chantix up to 12 weeks. He has picked a cessation date. 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

## 2019-01-31 ENCOUNTER — Encounter: Payer: Self-pay | Admitting: Physician Assistant

## 2019-01-31 DIAGNOSIS — K5730 Diverticulosis of large intestine without perforation or abscess without bleeding: Secondary | ICD-10-CM | POA: Diagnosis not present

## 2019-01-31 DIAGNOSIS — R197 Diarrhea, unspecified: Secondary | ICD-10-CM | POA: Diagnosis not present

## 2019-01-31 DIAGNOSIS — Z1211 Encounter for screening for malignant neoplasm of colon: Secondary | ICD-10-CM | POA: Diagnosis not present

## 2019-02-04 ENCOUNTER — Other Ambulatory Visit: Payer: Self-pay | Admitting: Physician Assistant

## 2019-02-04 DIAGNOSIS — F17200 Nicotine dependence, unspecified, uncomplicated: Secondary | ICD-10-CM

## 2019-02-19 ENCOUNTER — Other Ambulatory Visit: Payer: Self-pay | Admitting: Physician Assistant

## 2019-02-20 NOTE — Telephone Encounter (Signed)
Xanax last rx 12/15/18 #45 1 RF CSC: 12/14/17 LOV: 01/23/19 for smoking cessation

## 2019-03-10 ENCOUNTER — Institutional Professional Consult (permissible substitution): Payer: 59 | Admitting: Internal Medicine

## 2019-03-13 ENCOUNTER — Other Ambulatory Visit: Payer: Self-pay

## 2019-03-13 ENCOUNTER — Other Ambulatory Visit (INDEPENDENT_AMBULATORY_CARE_PROVIDER_SITE_OTHER): Payer: 59

## 2019-03-13 DIAGNOSIS — E871 Hypo-osmolality and hyponatremia: Secondary | ICD-10-CM

## 2019-03-13 DIAGNOSIS — E890 Postprocedural hypothyroidism: Secondary | ICD-10-CM

## 2019-03-13 LAB — T4, FREE: Free T4: 0.86 ng/dL (ref 0.60–1.60)

## 2019-03-13 LAB — TSH: TSH: 4.96 u[IU]/mL — ABNORMAL HIGH (ref 0.35–4.50)

## 2019-03-14 LAB — BASIC METABOLIC PANEL
BUN: 8 mg/dL (ref 6–23)
CO2: 24 mEq/L (ref 19–32)
Calcium: 8.9 mg/dL (ref 8.4–10.5)
Chloride: 97 mEq/L (ref 96–112)
Creatinine, Ser: 0.91 mg/dL (ref 0.40–1.50)
GFR: 84.91 mL/min (ref >60.00)
Glucose, Bld: 99 mg/dL (ref 70–99)
Potassium: 4.3 mEq/L (ref 3.5–5.1)
Sodium: 133 mEq/L — ABNORMAL LOW (ref 135–145)

## 2019-03-15 ENCOUNTER — Encounter: Payer: Self-pay | Admitting: Internal Medicine

## 2019-03-15 ENCOUNTER — Ambulatory Visit: Payer: 59 | Admitting: Internal Medicine

## 2019-03-15 ENCOUNTER — Other Ambulatory Visit: Payer: Self-pay

## 2019-03-15 DIAGNOSIS — I10 Essential (primary) hypertension: Secondary | ICD-10-CM

## 2019-03-15 DIAGNOSIS — J449 Chronic obstructive pulmonary disease, unspecified: Secondary | ICD-10-CM

## 2019-03-15 DIAGNOSIS — F17210 Nicotine dependence, cigarettes, uncomplicated: Secondary | ICD-10-CM | POA: Diagnosis not present

## 2019-03-15 NOTE — Progress Notes (Signed)
Jesse Europe [name], male    DOB: Aug 15, 1961,   MRN: [id number]   Brief patient profile:  69 yowm active smoker  Retired from [employer]  With onset around 2017/18 more severe episodes of "bronchitis" req pred rx esp in winter but good baseline ex tol referred to pulmonary clinic 03/15/2019 by Elyn Aquas for abn screening CT Chest c/w emphysema on 11/04/2018     History of Present Illness  03/15/2019  Pulmonary/ 1st office eval/  Chief Complaint  Patient presents with  . Pulmonary Consult    Referred by Raiford Noble, PA for eval of Emphysema. He denies having any respiratory co's. He has albuterol inhaler and nebs but does not use them.   Dyspnea:  Steps ok/ yardwork ok unless having flare of "bronchitis" with no flares x sev months Cough: tendency to nasal drainage causing cough in Spring / winter time cold but usually not purulent   Sleep: sleeps fine flat / sometimes up with cough sev times a week x 2-3 years x year round  SABA use: none now/ saba helps some in past   No obvious day to day or daytime variability or ongoing assoc excess/ purulent sputum or mucus plugs or hemoptysis or cp or chest tightness, subjective wheeze or overt sinus or hb symptoms.     Also denies any obvious fluctuation of symptoms with weather or environmental changes or other aggravating or alleviating factors except as outlined above   No unusual exposure hx or h/o childhood pna/ asthma or knowledge of premature birth.  Current Allergies, Complete Past Medical History, Past Surgical History, Family History, and Social History were reviewed in [employer] record.  ROS  The following are not active complaints unless bolded Hoarseness, sore throat, dysphagia, dental problems, itching, sneezing,  nasal congestion or discharge of excess mucus or purulent secretions, ear ache,   fever, chills, sweats, unintended wt loss or wt gain, classically pleuritic or exertional cp,  orthopnea  pnd or arm/hand swelling  or leg swelling, presyncope, palpitations, abdominal pain, anorexia, nausea, vomiting, diarrhea  or change in bowel habits or change in bladder habits, change in stools or change in urine, dysuria, hematuria,  rash, arthralgias, visual complaints, headache, numbness, weakness or ataxia or problems with walking or coordination,  change in mood or  memory.           Past Medical History:  Diagnosis Date  . Allergy   . Anxiety   . Atrial fibrillation ([place])   . Bell palsy   . Depression   . Diastolic heart failure   . Hypercholesterolemia   . Hypertension   . Pneumonia   . Thyroid disease     Outpatient Medications Prior to Visit  Medication Sig Dispense Refill  . albuterol (PROVENTIL) (2.5 MG/3ML) 0.083% nebulizer solution Take 3 mLs (2.5 mg total) by nebulization every 6 (six) hours as needed for wheezing or shortness of breath. 150 mL 1  . ALPRAZolam (XANAX) 1 MG tablet TAKE 1/2 TABLET BY MOUTH 3 TIMES A DAY 45 tablet 1  . aspirin 81 MG tablet Take 81 mg by mouth every other day.     . carvedilol (COREG) 12.5 MG tablet TAKE 1 AND 1/2 TABLETS TWICE DAILY 270 tablet 1  . CHANTIX 1 MG tablet TAKE 1 TABLET BY MOUTH TWICE A DAY 168 tablet 1  . cloNIDine (CATAPRES - DOSED IN MG/24 HR) 0.1 mg/24hr patch PLACE 1 PATCH (0.1 MG TOTAL) ONTO THE SKIN ONCE A WEEK.  12 patch 4  . colestipol (COLESTID) 1 g tablet Take 2 g by mouth 2 (two) times daily.    . flecainide (TAMBOCOR) 100 MG tablet TAKE 1 TABLET BY MOUTH TWICE A DAY 180 tablet 1  . FLUoxetine (PROZAC) 40 MG capsule Take 2 capsules (80 mg total) by mouth daily. 180 capsule 1  . ipratropium (ATROVENT) 0.03 % nasal spray Place 2 sprays into both nostrils 2 (two) times daily. USE 2 SPRAYS INTO BOTH NOSTRIL 3 TIMES DAILY 90 mL 1  . lisinopril (PRINIVIL,ZESTRIL) 40 MG tablet Take 1 tablet (40 mg total) by mouth daily. 90 tablet 2  . simvastatin (ZOCOR) 40 MG tablet TAKE 1 TABLET BY MOUTH EVERYDAY AT BEDTIME 90 tablet 1  .  SYNTHROID 175 MCG tablet TAKE 1 TABLET BY MOUTH DAILY BEFORE BREAKFAST 90 tablet 1  . tamsulosin (FLOMAX) 0.4 MG CAPS capsule Take 1 capsule (0.4 mg total) by mouth daily. 90 capsule 1      Objective:     BP 124/78 (BP Location: Left Arm, Cuff Size: Normal)   Pulse (!) 57   Temp 97.9 F (36.6 C) (Oral)   Ht 5\' 9"  (1.753 m)   Wt 206 lb 12.8 oz (93.8 kg)   SpO2 100%   BMI 30.54 kg/m   SpO2: 100 %   RA  . HEENT: nl dentition / oropharynx. Nl external ear canals without cough reflex -  Mild bilateral non-specific turbinate edema     NECK :  without JVD/Nodes/TM/ nl carotid upstrokes bilaterally   LUNGS: no acc muscle use,  Min barrel  contour chest wall with bilateral  slightly decreased bs s audible wheeze and  without cough on insp or exp maneuver and min  Hyperresonant  to  percussion bilaterally     CV:  RRR  no s3 or murmur or increase in P2, and no edema   ABD:  soft and nontender with pos end  insp Hoover's  in the supine position. No bruits or organomegaly appreciated, bowel sounds nl  MS:   Nl gait/  ext warm without deformities, calf tenderness, cyanosis or clubbing No obvious joint restrictions   SKIN: warm and dry without lesions    NEURO:  alert, approp, nl sensorium with  no motor or cerebellar deficits apparent.        I personally reviewed images and agree with radiology impression as follows:   Chest CT LDSCT  11/04/2018 Centrilobular and paraseptal emphysema. Pleuroparenchymal scarring at the base of the right hemithorax. No worrisome pulmonary nodules. No pleural fluid. Airway is unremarkable.     Assessment   COPD GOLD 0/ emphysema on CT  Active smoker  - PFT's 11/16/2018  FEV1 1.74 (47 % ) ratio 0.73  p no % improvement from saba p nothing prior to study with DLCO  51 % corrects to 67 % for alv volume  And minimal curvature  At this point he does not meet the technical requirements for the diagnosis of COPD and therefore I gave him a stage of 0.     I reviewed the Fletcher curve with the patient that basically indicates  if you quit smoking when your best day FEV1 is still  preserved (as is still   the case here)  it is highly unlikely you will progress to severe disease and informed the patient there was  no medication on the market that has proven to alter the curve/ its downward trajectory  or the likelihood of progression of their  disease(unlike other chronic medical conditions such as atheroclerosis where we do think we can change the natural hx with risk reducing meds)    Therefore stopping smoking and maintaining abstinence are  the most important aspects of care, not choice of inhalers or for that matter, doctors.   Treatment other than smoking cessation  is entirely directed by severity of symptoms and focused also on reducing exacerbations, not attempting to change the natural history of the disease.   Since he is not having active symptoms of dyspnea that limits his exertion or frequent enough exacerbations to warrant maintenance therapy I think it is fine to work on smoking cessation and just use albuterol as needed.  The one caveat here is that he is on ACE inhibitors and does have somewhat of a chronic dry cough (not typical of cb/copd)  but I think he downplays but is likely either related to postnasal drip syndrome or ACE inhibitor's.  Because he seemed to have such poor insight into his medications I did not recommend a change in the medicines yet but would have a very low threshold to switch him to alternative therapy..  >>>> Pulmonary follow-up in this setting can be as needed.     Cigarette smoker Counseled re importance of smoking cessation but did not meet time criteria for separate billing      Total time devoted to counseling  > 50 % of initial 60 min office visit:  review case with pt/ discussion of options/alternatives/ personally creating written customized instructions  in presence of pt  then going over those  specific  Instructions directly with the pt including how to use all of the meds but in particular covering each new medication in detail and the difference between the maintenance= "automatic" meds and the prns using an action plan format for the latter (If this problem/symptom => do that organization reading Left to right).  Please see AVS from this visit for a full list of these instructions which I personally wrote for this pt and  are unique to this visit.    Christinia Gully, MD 03/15/2019

## 2019-03-15 NOTE — Assessment & Plan Note (Signed)
Counseled re importance of smoking cessation but did not meet time criteria for separate billing    Total time devoted to counseling  > 50 % of initial 60 min office visit:  review case with pt/ discussion of options/alternatives/ personally creating written customized instructions  in presence of pt  then going over those specific  Instructions directly with the pt including how to use all of the meds but in particular covering each new medication in detail and the difference between the maintenance= "automatic" meds and the prns using an action plan format for the latter (If this problem/symptom => do that organization reading Left to right).  Please see AVS from this visit for a full list of these instructions which I personally wrote for this pt and  are unique to this visit.

## 2019-03-15 NOTE — Patient Instructions (Signed)
The key is to stop smoking completely before smoking completely stops you - it is not too late!  Only use your albuterol as a rescue medication to be used if you can't catch your breath by resting or doing a relaxed purse lip breathing pattern.  - The less you use it, the better it will work when you need it. - Ok to use up to 2 puffs  every 4 hours if you must but call for immediate appointment if use goes up over your usual need - Don't leave home without it !!  (think of it like the spare tire for your car)    I would recommend you not be on ACE inhibitors (lisinopril) if you  have tendency to cough or severe bronchitis or trouble breathing as this medication can confuse the interpretation of your symptoms    Pulmonary follow up is as needed

## 2019-03-15 NOTE — Assessment & Plan Note (Signed)
Active smoker  - PFT's 11/16/2018  FEV1 1.74 (47 % ) ratio 0.73  p no % improvement from saba p nothing prior to study with DLCO  51 % corrects to 67 % for alv volume  And minimal curvature  At this point he does not meet the technical requirements for the diagnosis of COPD and therefore I gave him a stage of 0.    I reviewed the Fletcher curve with the patient that basically indicates  if you quit smoking when your best day FEV1 is still  preserved (as is still   the case here)  it is highly unlikely you will progress to severe disease and informed the patient there was  no medication on the market that has proven to alter the curve/ its downward trajectory  or the likelihood of progression of their disease(unlike other chronic medical conditions such as atheroclerosis where we do think we can change the natural hx with risk reducing meds)    Therefore stopping smoking and maintaining abstinence are  the most important aspects of care, not choice of inhalers or for that matter, doctors.   Treatment other than smoking cessation  is entirely directed by severity of symptoms and focused also on reducing exacerbations, not attempting to change the natural history of the disease.   Since he is not having active symptoms of dyspnea that limits his exertion or frequent enough exacerbations to warrant maintenance therapy I think it is fine to work on smoking cessation and just use albuterol as needed.  The one caveat here is that he is on ACE inhibitors and does have somewhat of a chronic dry cough (not typical of cb/copd)  but I think he downplays but is likely either related to postnasal drip syndrome or ACE inhibitor's.  Because he seemed to have such poor insight into his medications I did not recommend a change in the medicines yet but would have a very low threshold to switch him to alternative therapy..  >>>> Pulmonary follow-up in this setting can be as needed.

## 2019-03-16 ENCOUNTER — Ambulatory Visit (INDEPENDENT_AMBULATORY_CARE_PROVIDER_SITE_OTHER): Payer: 59 | Admitting: Endocrinology

## 2019-03-16 ENCOUNTER — Encounter: Payer: Self-pay | Admitting: Endocrinology

## 2019-03-16 DIAGNOSIS — E890 Postprocedural hypothyroidism: Secondary | ICD-10-CM

## 2019-03-16 DIAGNOSIS — E871 Hypo-osmolality and hyponatremia: Secondary | ICD-10-CM

## 2019-03-16 NOTE — Progress Notes (Signed)
Patient ID: Jesse Europe [name], male   DOB: 1961-05-22, 58 y.o.   MRN: [id number]  Today's office visit was provided via telemedicine using a telephone call to the patient Patient has been explained the limitations of evaluation and management by telemedicine and the availability of in person appointments.  The patient understood the limitations and agreed to proceed. Patient also understood that the telehealth visit is billable. . Location of the patient: Home . Location of the provider: Office Only the patient and myself were participating in the encounter   Reason for Appointment: Follow-up    History of Present Illness:   The hypothyroidism was first diagnosed in 11/13. Previously had I-131 treatment for Graves' disease He was initially started with 88 mcg but his dose needed to be increased progressively  He has been treated with brand name SYNTHROID, the dose has been adjusted previously based on his labs  He is taking 175 mcg dosage fairly consistently now Recently did not complain of any unusual fatigue or lethargy His weight appears to be slightly lower  He is taking his thyroid supplement regularly before breakfast  TSH which was normal in 12/19 is now high at about 5  Wt Readings from Last 3 Encounters:  03/15/19 206 lb 12.8 oz (93.8 kg)  11/22/18 212 lb 4 oz (96.3 kg)  10/19/18 214 lb (97.1 kg)    LABS:   Lab Results  Component Value Date   TSH 4.96 (H) 03/13/2019   TSH 2.00 09/05/2018   TSH 1.25 12/02/2017   FREET4 0.86 03/13/2019   FREET4 1.49 09/05/2018   FREET4 1.17 12/02/2017      HYPERTENSION:  He has had long-standing hypertension which is  managed with lisinopril 40, Catapres 0.1 mg patch and Coreg 1-1/2 tablets twice a day He is now taking hydrochlorothiazide which was causing hyponatremia  He was checked for secondary hypertension but reports of his aldosterone/renin indicate normal levels Also was supposed to get the renal artery  ultrasound but this was not done  He was recently seen by pulmonologist and there was some question about cough related to lisinopril but he says that he is only having rare cough He is taking all his medications as directed as above  He has checked his blood pressure occasionally and he thinks that it is usually near normal   BP Readings from Last 3 Encounters:  03/15/19 124/78  11/22/18 131/82  10/19/18 120/88       Allergies as of 03/16/2019      Reactions   Sulfa Drugs Cross Reactors Hives   Codeine Other (See Comments)   Made him very jittery    Hydrochlorothiazide Other (See Comments)   Hyponatremia      Medication List       Accurate as of March 16, 2019  1:40 PM. If you have any questions, ask your nurse or doctor.        albuterol (2.5 MG/3ML) 0.083% nebulizer solution Commonly known as: PROVENTIL Take 3 mLs (2.5 mg total) by nebulization every 6 (six) hours as needed for wheezing or shortness of breath.   ALPRAZolam 1 MG tablet Commonly known as: XANAX TAKE 1/2 TABLET BY MOUTH 3 TIMES A DAY   aspirin 81 MG tablet Take 81 mg by mouth every other day.   carvedilol 12.5 MG tablet Commonly known as: COREG TAKE 1 AND 1/2 TABLETS TWICE DAILY   Chantix 1 MG tablet Generic drug: varenicline TAKE 1 TABLET BY MOUTH TWICE A DAY  cloNIDine 0.1 mg/24hr patch Commonly known as: CATAPRES - Dosed in mg/24 hr PLACE 1 PATCH (0.1 MG TOTAL) ONTO THE SKIN ONCE A WEEK.   colestipol 1 g tablet Commonly known as: COLESTID Take 2 g by mouth 2 (two) times daily.   flecainide 100 MG tablet Commonly known as: TAMBOCOR TAKE 1 TABLET BY MOUTH TWICE A DAY   FLUoxetine 40 MG capsule Commonly known as: PROZAC Take 2 capsules (80 mg total) by mouth daily.   ipratropium 0.03 % nasal spray Commonly known as: ATROVENT Place 2 sprays into both nostrils 2 (two) times daily. USE 2 SPRAYS INTO BOTH NOSTRIL 3 TIMES DAILY   lisinopril 40 MG tablet Commonly known as: ZESTRIL Take  1 tablet (40 mg total) by mouth daily.   simvastatin 40 MG tablet Commonly known as: ZOCOR TAKE 1 TABLET BY MOUTH EVERYDAY AT BEDTIME   Synthroid 175 MCG tablet Generic drug: levothyroxine TAKE 1 TABLET BY MOUTH DAILY BEFORE BREAKFAST   tamsulosin 0.4 MG Caps capsule Commonly known as: FLOMAX Take 1 capsule (0.4 mg total) by mouth daily.       Past Medical History:  Diagnosis Date  . Allergy   . Anxiety   . Atrial fibrillation ([place])   . Bell palsy   . Depression   . Diastolic heart failure   . Hypercholesterolemia   . Hypertension   . Pneumonia   . Thyroid disease     Past Surgical History:  Procedure Laterality Date  . CHOLECYSTECTOMY    . HERNIA REPAIR    . KNEE SURGERY    . VASECTOMY      Family History  Problem Relation Age of Onset  . Heart disease Mother   . Heart attack Mother   . Other Mother        thyroid problems  . Kidney cancer Mother   . Cirrhosis Father   . Other Father        thyroid problems  . Alcohol abuse Father   . Hypertension Father   . Mental illness Brother   . Parkinsonism Brother     Social History:  reports that he has been smoking cigarettes. He started smoking about 40 years ago. He has a 60.00 pack-year smoking history. He has never used smokeless tobacco. He reports current alcohol use. He reports that he does not use drugs.  Allergies:  Allergies  Allergen Reactions  . Sulfa Drugs Cross Reactors Hives  . Codeine Other (See Comments)    Made him very jittery   . Hydrochlorothiazide Other (See Comments)    Hyponatremia    REVIEW of systems:   HYPONATREMIA: His sodium previously was significantly low with HCTZ  It is now relatively better but not normal He was supposed to get urine osmolality checked but he did not do so He has no nausea or drowsiness CT scan of his chest did not show any tumor  Lab Results  Component Value Date   NA 133 (L) 03/13/2019   K 4.3 03/13/2019   CL 97 03/13/2019   CO2 24  03/13/2019       Examination:   There were no vitals taken for this visit.      Assessments   Hyponatremia: better with stopping HCTZ but still low at 131, etiology unclear but will continue to monitor Asymptomatic currently   HYPERTENSION:  His blood pressure is well controlled with current regimen  HYPOTHYROIDISM: His TSH is 5.0 approximately and he is asymptomatic He has been compliant with  his supplement consistently and is still on brand-name Synthroid  For now he can increase the dose by half tablet weekly and stay on the 175 mcg prescription  Follow-up in 3 months      03/16/2019, 1:40 PM

## 2019-03-26 IMAGING — CT CT CHEST LUNG CANCER SCREENING LOW DOSE W/O CM
1 of 2 series · 10 of 40 positions shown, 13 images · non-contrast
Comparison: 06/15/2011.

CLINICAL DATA: Current smoker, 45 pack-year history.

EXAM:
CT CHEST WITHOUT CONTRAST LOW-DOSE FOR LUNG CANCER SCREENING
TECHNIQUE: Multidetector CT imaging of the chest was performed following the
standard protocol without IV contrast.

[ct lung segmentation data · axial · 0.91mm/px · z∈[-350,-350]mm · 10 of 335 frames shown]
[frame 1/335  mediastinal]
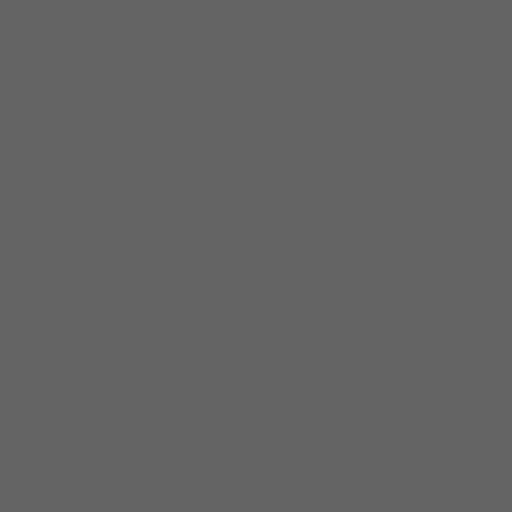
[frame 1/335  lung]
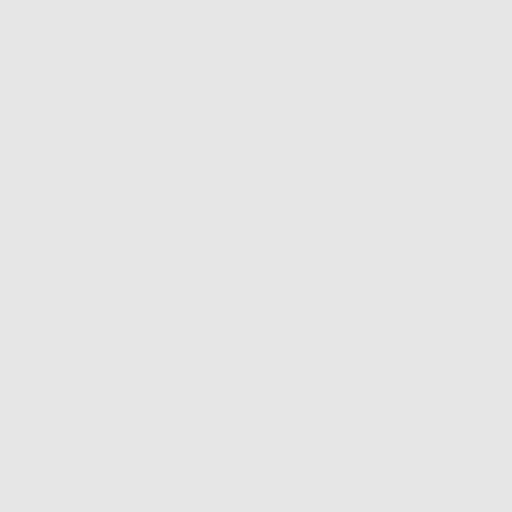
[frame 38/335  lung]
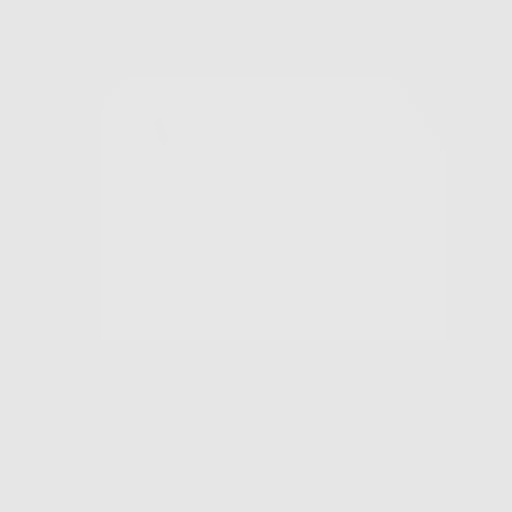
[frame 75/335  lung]
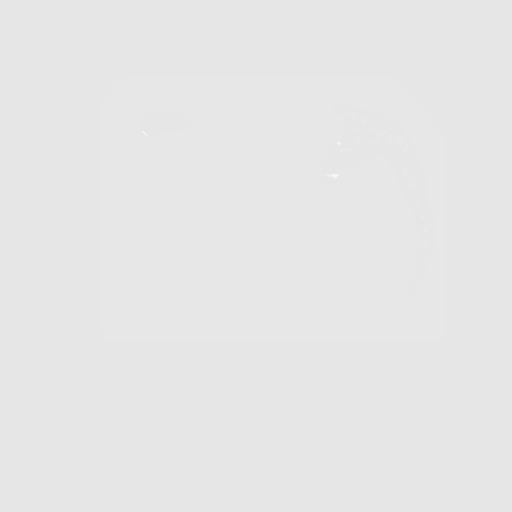
[frame 112/335  lung]
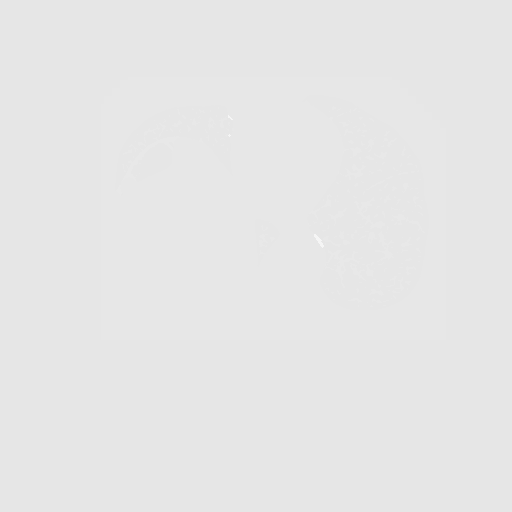
[frame 149/335  mediastinal]
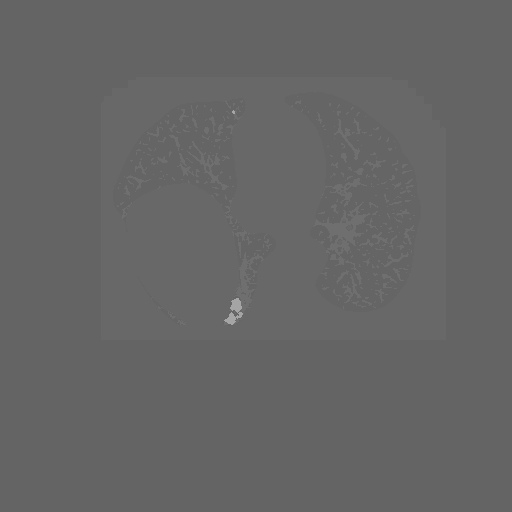
[frame 149/335  lung]
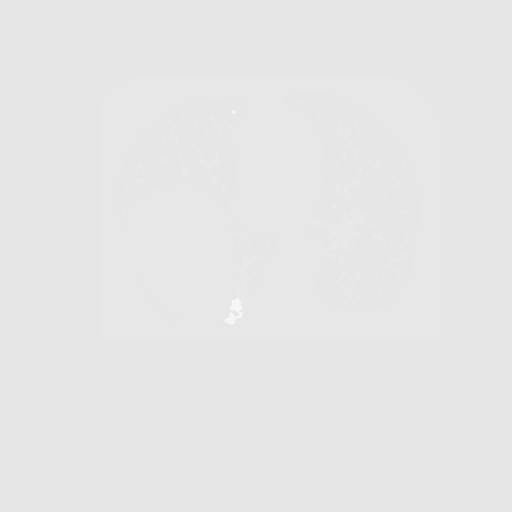
[frame 186/335  lung]
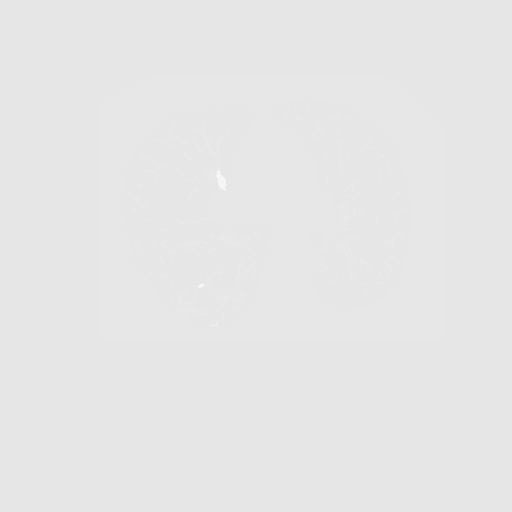
[frame 223/335  lung]
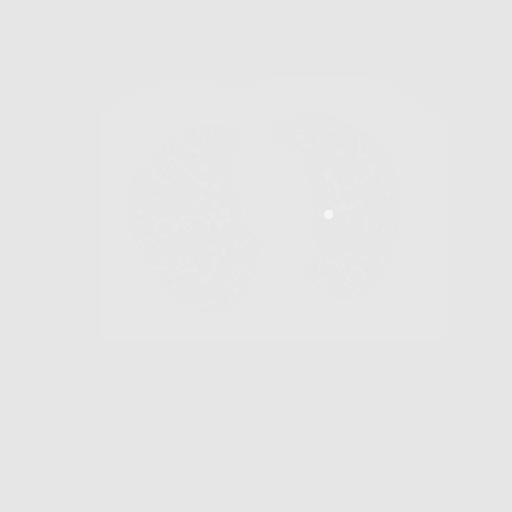
[frame 260/335  lung]
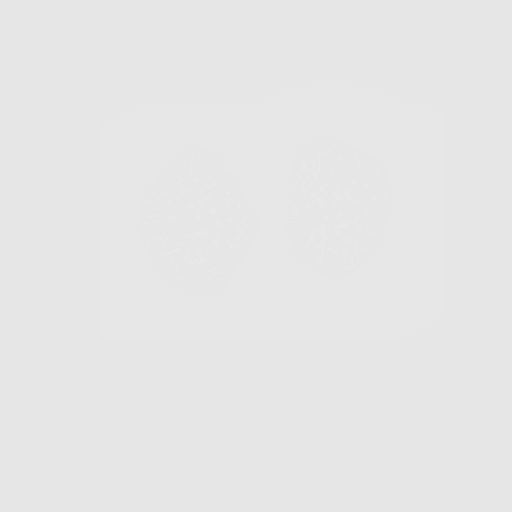
[frame 297/335  mediastinal]
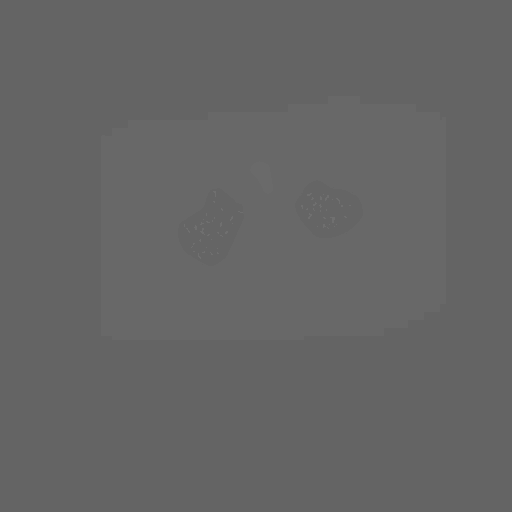
[frame 297/335  lung]
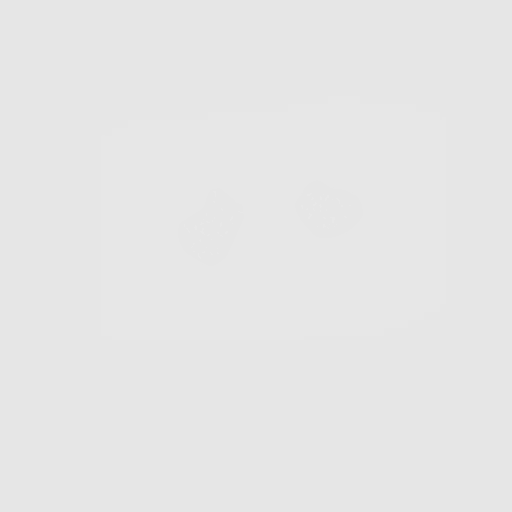
[frame 335/335  lung]
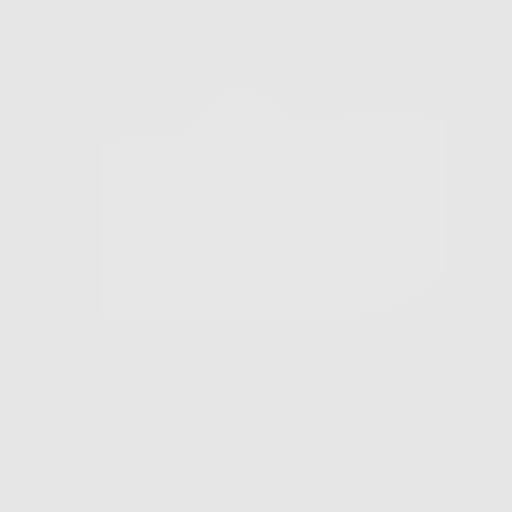

[10 of 40 positions shown; findings below may reference images not displayed]

FINDINGS: Cardiovascular: Atherosclerotic calcification of the aorta and
coronary arteries. Heart size normal. No pericardial effusion.

Mediastinum/Nodes: No pathologically enlarged mediastinal or
axillary lymph nodes. Hilar regions are difficult to evaluate
without IV contrast but appear grossly unremarkable. Esophagus is
grossly unremarkable.

Lungs/Pleura: Centrilobular and paraseptal emphysema.
Pleuroparenchymal scarring at the base of the right hemithorax. No
worrisome pulmonary nodules. No pleural fluid. Airway is
unremarkable.

Upper Abdomen: Visualized portions of the liver and adrenal glands
are unremarkable. 2.6 cm fluid density lesion off the upper pole
right kidney is incompletely imaged. Visualized portions of the
kidneys, spleen, pancreas, stomach and bowel are otherwise
unremarkable. Cholecystectomy. No upper abdominal adenopathy.

Musculoskeletal: Degenerative changes in the spine. No worrisome
lytic or sclerotic lesions.
IMPRESSION: 1. Lung-RADS 1, negative. Continue annual screening with low-dose
chest CT without contrast in 12 months.
2. Aortic atherosclerosis (PQJFU-170.0). Coronary artery
calcification.
3.  Emphysema (PQJFU-I96.X).

## 2019-04-11 ENCOUNTER — Other Ambulatory Visit: Payer: Self-pay | Admitting: Physician Assistant

## 2019-04-23 ENCOUNTER — Other Ambulatory Visit: Payer: Self-pay | Admitting: Physician Assistant

## 2019-04-23 ENCOUNTER — Other Ambulatory Visit: Payer: Self-pay | Admitting: Internal Medicine

## 2019-04-23 DIAGNOSIS — N401 Enlarged prostate with lower urinary tract symptoms: Secondary | ICD-10-CM

## 2019-04-23 DIAGNOSIS — R351 Nocturia: Secondary | ICD-10-CM

## 2019-04-26 ENCOUNTER — Other Ambulatory Visit: Payer: Self-pay | Admitting: Physician Assistant

## 2019-04-26 NOTE — Telephone Encounter (Signed)
Last refill: 6.8.20 #45, 1 Last OV: 5.11.20 dx. Tobacco abuse disorder

## 2019-05-21 ENCOUNTER — Other Ambulatory Visit: Payer: Self-pay | Admitting: Internal Medicine

## 2019-06-01 ENCOUNTER — Other Ambulatory Visit: Payer: Self-pay | Admitting: Physician Assistant

## 2019-06-01 NOTE — Telephone Encounter (Signed)
Last OV 01/23/19 Alprazolam last filled 04/26/19 #45 with 0

## 2019-06-04 ENCOUNTER — Other Ambulatory Visit: Payer: Self-pay | Admitting: Internal Medicine

## 2019-06-04 DIAGNOSIS — I10 Essential (primary) hypertension: Secondary | ICD-10-CM

## 2019-06-13 ENCOUNTER — Other Ambulatory Visit: Payer: Self-pay

## 2019-06-13 ENCOUNTER — Other Ambulatory Visit (INDEPENDENT_AMBULATORY_CARE_PROVIDER_SITE_OTHER): Payer: 59

## 2019-06-13 DIAGNOSIS — E890 Postprocedural hypothyroidism: Secondary | ICD-10-CM

## 2019-06-13 DIAGNOSIS — E871 Hypo-osmolality and hyponatremia: Secondary | ICD-10-CM

## 2019-06-13 LAB — BASIC METABOLIC PANEL
BUN: 9 mg/dL (ref 6–23)
CO2: 26 mEq/L (ref 19–32)
Calcium: 8.9 mg/dL (ref 8.4–10.5)
Chloride: 97 mEq/L (ref 96–112)
Creatinine, Ser: 0.83 mg/dL (ref 0.40–1.50)
GFR: 94.35 mL/min (ref >60.00)
Glucose, Bld: 104 mg/dL — ABNORMAL HIGH (ref 70–99)
Potassium: 3.7 mEq/L (ref 3.5–5.1)
Sodium: 132 mEq/L — ABNORMAL LOW (ref 135–145)

## 2019-06-13 LAB — T4, FREE: Free T4: 1.27 ng/dL (ref 0.60–1.60)

## 2019-06-13 LAB — TSH: TSH: 1.3 u[IU]/mL (ref 0.35–4.50)

## 2019-06-16 ENCOUNTER — Ambulatory Visit (INDEPENDENT_AMBULATORY_CARE_PROVIDER_SITE_OTHER): Payer: 59 | Admitting: Endocrinology

## 2019-06-16 ENCOUNTER — Other Ambulatory Visit: Payer: Self-pay

## 2019-06-16 ENCOUNTER — Encounter: Payer: Self-pay | Admitting: Endocrinology

## 2019-06-16 DIAGNOSIS — E871 Hypo-osmolality and hyponatremia: Secondary | ICD-10-CM

## 2019-06-16 DIAGNOSIS — E890 Postprocedural hypothyroidism: Secondary | ICD-10-CM | POA: Diagnosis not present

## 2019-06-16 NOTE — Progress Notes (Signed)
Patient ID: Jesse Europe [name], male   DOB: November 15, 1960, 58 y.o.   MRN: [id number]  Today's office visit was provided via telemedicine using a telephone call to the patient Patient has been explained the limitations of evaluation and management by telemedicine and the availability of in person appointments.  The patient understood the limitations and agreed to proceed. Patient also understood that the telehealth visit is billable. . Location of the patient: Home . Location of the provider: Office Only the patient and myself were participating in the encounter   Reason for Appointment: Follow-up    History of Present Illness:   The hypothyroidism was first diagnosed in 11/13. Previously had I-131 treatment for Graves' disease He was initially started with 88 mcg but his dose needed to be increased progressively  He has been treated with brand name SYNTHROID, the dose has been adjusted previously based on his labs  He is taking 175 mcg dosage His recent dosage change was with adding a half a pill weekly on his visit in 7/20 because of elevated TSH of 5. Although he thinks that he is does not feel any better with the dosage change his TSH is back to 1.3 now  He also thinks that his weight is somewhat lower around 200 or less instead of 206 and appears to be losing weight this year with better diet  He is taking his thyroid supplement regularly before breakfast  Labs as follows  Wt Readings from Last 3 Encounters:  03/15/19 206 lb 12.8 oz (93.8 kg)  11/22/18 212 lb 4 oz (96.3 kg)  10/19/18 214 lb (97.1 kg)    LABS:   Lab Results  Component Value Date   TSH 1.30 06/13/2019   TSH 4.96 (H) 03/13/2019   TSH 2.00 09/05/2018   FREET4 1.27 06/13/2019   FREET4 0.86 03/13/2019   FREET4 1.49 09/05/2018      HYPERTENSION:  He has had long-standing hypertension which is  managed with lisinopril 40, Catapres 0.1 mg patch and Coreg 1-1/2 tablets twice a day He was told to  stop hydrochlorothiazide which was causing hyponatremia  He was evaluated for secondary hypertension and reports of his aldosterone/renin indicate normal levels Also was supposed to get the renal artery ultrasound but this was not done   He has checked his blood pressure periodically He has a normal monitor Blood pressure range [id number] systolic and 99991111 diastolic  Also follows up with PCP periodically  BP Readings from Last 3 Encounters:  03/15/19 124/78  11/22/18 131/82  10/19/18 120/88       Allergies as of 06/16/2019      Reactions   Sulfa Drugs Cross Reactors Hives   Codeine Other (See Comments)   Made him very jittery    Hydrochlorothiazide Other (See Comments)   Hyponatremia      Medication List       Accurate as of June 16, 2019  1:40 PM. If you have any questions, ask your nurse or doctor.        albuterol (2.5 MG/3ML) 0.083% nebulizer solution Commonly known as: PROVENTIL Take 3 mLs (2.5 mg total) by nebulization every 6 (six) hours as needed for wheezing or shortness of breath.   ALPRAZolam 1 MG tablet Commonly known as: XANAX TAKE 1/2 TABLET BY MOUTH 3 TIMES A DAY   aspirin 81 MG tablet Take 81 mg by mouth every other day.   carvedilol 12.5 MG tablet Commonly known as: COREG Take 1.5 tablets (18.75 mg  total) by mouth 2 (two) times daily. Please keep upcoming appt in December with Dr. Lovena Le before anymore refills. Thank you   Chantix 1 MG tablet Generic drug: varenicline TAKE 1 TABLET BY MOUTH TWICE A DAY   cloNIDine 0.1 mg/24hr patch Commonly known as: CATAPRES - Dosed in mg/24 hr PLACE 1 PATCH (0.1 MG TOTAL) ONTO THE SKIN ONCE A WEEK.   colestipol 1 g tablet Commonly known as: COLESTID Take 2 g by mouth 2 (two) times daily.   flecainide 100 MG tablet Commonly known as: TAMBOCOR TAKE 1 TABLET BY MOUTH TWICE A DAY   FLUoxetine 40 MG capsule Commonly known as: PROZAC TAKE 2 CAPSULES BY MOUTH DAILY   ipratropium 0.03 % nasal spray  Commonly known as: ATROVENT Place 2 sprays into both nostrils 2 (two) times daily. USE 2 SPRAYS INTO BOTH NOSTRIL 3 TIMES DAILY   lisinopril 40 MG tablet Commonly known as: ZESTRIL Take 1 tablet (40 mg total) by mouth daily.   simvastatin 40 MG tablet Commonly known as: ZOCOR TAKE 1 TABLET BY MOUTH EVERYDAY AT BEDTIME   Synthroid 175 MCG tablet Generic drug: levothyroxine TAKE 1 TABLET BY MOUTH DAILY BEFORE BREAKFAST What changed: See the new instructions.   tamsulosin 0.4 MG Caps capsule Commonly known as: FLOMAX TAKE 1 CAPSULE BY MOUTH EVERY DAY       Past Medical History:  Diagnosis Date  . Allergy   . Anxiety   . Atrial fibrillation ([place])   . Bell palsy   . Depression   . Diastolic heart failure   . Hypercholesterolemia   . Hypertension   . Pneumonia   . Thyroid disease     Past Surgical History:  Procedure Laterality Date  . CHOLECYSTECTOMY    . HERNIA REPAIR    . KNEE SURGERY    . VASECTOMY      Family History  Problem Relation Age of Onset  . Heart disease Mother   . Heart attack Mother   . Other Mother        thyroid problems  . Kidney cancer Mother   . Cirrhosis Father   . Other Father        thyroid problems  . Alcohol abuse Father   . Hypertension Father   . Mental illness Brother   . Parkinsonism Brother     Social History:  reports that he has been smoking cigarettes. He started smoking about 40 years ago. He has a 60.00 pack-year smoking history. He has never used smokeless tobacco. He reports current alcohol use. He reports that he does not use drugs.  Allergies:  Allergies  Allergen Reactions  . Sulfa Drugs Cross Reactors Hives  . Codeine Other (See Comments)    Made him very jittery   . Hydrochlorothiazide Other (See Comments)    Hyponatremia    REVIEW of systems:   HYPONATREMIA: His sodium previously was significantly low with HCTZ  It is now relatively better, now 132 compared to 131  He still taking 80 mg of Prozac  He says that he does not like to put salt on his food and is asking about increasing salt intake  Urine osmolality has not been collected He has no nausea or drowsiness recently  CT scan of his chest did not show any tumor  Lab Results  Component Value Date   NA 132 (L) 06/13/2019   K 3.7 06/13/2019   CL 97 06/13/2019   CO2 26 06/13/2019      Examination:  There were no vitals taken for this visit.      Assessments   Hyponatremia: Asymptomatic and mild Likely to be from SSRI, he has taken large doses of Prozac for some time  As long as he is asymptomatic he can continue his Prozac Discussed etiology of his mild SIADH and currently no treatment needed  HYPERTENSION:  His blood pressure is well controlled with current regimen, checked at home with [employer] monitor  HYPOTHYROIDISM: With increasing his supplement by half tablet weekly is thyroid level is back to normal with TSH 1.3 Also he is losing weight Subjectively doing well also  He will continue the same dose  Follow-up in 6 months  Duration of telephone conversation = 12 minutes Also patient records, labs, previous history reviewed in detail  [name] 06/16/2019, 1:40 PM

## 2019-06-23 ENCOUNTER — Other Ambulatory Visit: Payer: Self-pay | Admitting: Endocrinology

## 2019-06-28 ENCOUNTER — Other Ambulatory Visit: Payer: Self-pay | Admitting: Internal Medicine

## 2019-07-02 ENCOUNTER — Other Ambulatory Visit: Payer: Self-pay | Admitting: Physician Assistant

## 2019-07-10 ENCOUNTER — Other Ambulatory Visit: Payer: Self-pay | Admitting: Endocrinology

## 2019-07-10 DIAGNOSIS — I10 Essential (primary) hypertension: Secondary | ICD-10-CM

## 2019-07-15 ENCOUNTER — Other Ambulatory Visit: Payer: Self-pay | Admitting: Physician Assistant

## 2019-08-02 ENCOUNTER — Encounter: Payer: Self-pay | Admitting: Physician Assistant

## 2019-08-02 ENCOUNTER — Other Ambulatory Visit: Payer: Self-pay

## 2019-08-02 ENCOUNTER — Ambulatory Visit: Payer: 59 | Admitting: Physician Assistant

## 2019-08-02 VITALS — BP 124/90 | HR 52 | Temp 97.6°F | Resp 16 | Ht 69.0 in | Wt 204.0 lb

## 2019-08-02 DIAGNOSIS — H60391 Other infective otitis externa, right ear: Secondary | ICD-10-CM | POA: Diagnosis not present

## 2019-08-02 DIAGNOSIS — H6122 Impacted cerumen, left ear: Secondary | ICD-10-CM

## 2019-08-02 DIAGNOSIS — L819 Disorder of pigmentation, unspecified: Secondary | ICD-10-CM | POA: Diagnosis not present

## 2019-08-02 MED ORDER — CIPROFLOXACIN-DEXAMETHASONE 0.3-0.1 % OT SUSP: OTIC | 0 refills | Status: DC

## 2019-08-02 NOTE — Patient Instructions (Signed)
Please keep ears clean and dry. Put some cotton in the ear before showers to prevent water from getting in there.   For the R ear -- you have a moderate external ear infection. Please use the antibiotic/steroid drop as directed twice daily. If you note worsening symptoms -- pain, redness, drainage, etc -- let me know ASAP.  For the L ear -- this looks good. We were able to get the majority of wax out. Please get some OTC Debrox to apply to the ear once weekly for a couple of weeks to help break up the residual wax and so that your ear canal can push the wax out.   For the spot on the neck, I feel this is just an area of mild hypopigmentation. There may be some mild yeast in the area so get some OTC Lamisil to apply to the area twice daily for a week. If no change, feel this is just hypopigmentation and will improve over time.

## 2019-08-02 NOTE — Progress Notes (Signed)
Patient presents to clinic today c/o right ear fullness. States he was seen at urgent care 6 days ago for similar symptoms, had both ears cleaned manually with curette and flushed. Is continuing to have right ear fullness and discomfort with some decrease in hearing. Denies ear drainage or tinnitus. Denies recent URI symptoms.   Also notes an area of hypopigmentation over his anterior neck below his chin. States he first noticed it 2-3 weeks ago. Denies itching or irritation, scaling, or erythema. Does not recall any injury to the area.     Past Medical History:  Diagnosis Date  . Allergy   . Anxiety   . Atrial fibrillation ([place])   . Bell palsy   . Depression   . Diastolic heart failure   . Hypercholesterolemia   . Hypertension   . Pneumonia   . Thyroid disease     Current Outpatient Medications on File Prior to Visit  Medication Sig Dispense Refill  . albuterol (PROVENTIL) (2.5 MG/3ML) 0.083% nebulizer solution Take 3 mLs (2.5 mg total) by nebulization every 6 (six) hours as needed for wheezing or shortness of breath. 150 mL 1  . albuterol (VENTOLIN HFA) 108 (90 Base) MCG/ACT inhaler INHALE 2 PUFFS INTO THE LUNGS EVERY 6 HOURS AS NEEDED FOR WHEEZING/SHORTNESS OF BREATH 18 g 2  . ALPRAZolam (XANAX) 1 MG tablet TAKE 1/2 TABLET BY MOUTH 3 TIMES A DAY. Needs follow-up for further refill 45 tablet 0  . aspirin 81 MG tablet Take 81 mg by mouth every other day.     . carvedilol (COREG) 12.5 MG tablet Take 1.5 tablets (18.75 mg total) by mouth 2 (two) times daily. Please keep upcoming appt in December with Dr. Lovena Le before anymore refills. Thank you 270 tablet 0  . CHANTIX 1 MG tablet TAKE 1 TABLET BY MOUTH TWICE A DAY 168 tablet 1  . cloNIDine (CATAPRES - DOSED IN MG/24 HR) 0.1 mg/24hr patch PLACE 1 PATCH (0.1 MG TOTAL) ONTO THE SKIN ONCE A WEEK. 12 patch 4  . colestipol (COLESTID) 1 g tablet Take 2 g by mouth 2 (two) times daily.    . flecainide (TAMBOCOR) 100 MG tablet Take 1 tablet (100  mg total) by mouth 2 (two) times daily. Please keep upcoming appt in December with Dr. Lovena Le before anymore refills. Thank you 60 tablet 2  . FLUoxetine (PROZAC) 40 MG capsule TAKE 2 CAPSULES BY MOUTH DAILY 180 capsule 1  . lisinopril (ZESTRIL) 40 MG tablet TAKE 1 TABLET BY MOUTH EVERY DAY 90 tablet 2  . simvastatin (ZOCOR) 40 MG tablet TAKE 1 TABLET BY MOUTH EVERYDAY AT BEDTIME 90 tablet 1  . SYNTHROID 175 MCG tablet TAKE 1 TABLET BY MOUTH EVERY DAY BEFORE BREAKFAST 90 tablet 1  . tamsulosin (FLOMAX) 0.4 MG CAPS capsule TAKE 1 CAPSULE BY MOUTH EVERY DAY 90 capsule 1   No current facility-administered medications on file prior to visit.     Allergies  Allergen Reactions  . Sulfa Drugs Cross Reactors Hives  . Codeine Other (See Comments)    Made him very jittery   . Hydrochlorothiazide Other (See Comments)    Hyponatremia    Family History  Problem Relation Age of Onset  . Heart disease Mother   . Heart attack Mother   . Other Mother        thyroid problems  . Kidney cancer Mother   . Cirrhosis Father   . Other Father        thyroid problems  .  Alcohol abuse Father   . Hypertension Father   . Mental illness Brother   . Parkinsonism Brother     Social History   Socioeconomic History  . Marital status: Married    Spouse name: Not on file  . Number of children: 1  . Years of education: Not on file  . Highest education level: Not on file  Occupational History  . Not on file  Social Needs  . Financial resource strain: Not on file  . Food insecurity    Worry: Not on file    Inability: Not on file  . Transportation needs    Medical: Not on file    Non-medical: Not on file  Tobacco Use  . Smoking status: Current Every Day Smoker    Packs/day: 1.50    Years: 40.00    Pack years: 60.00    Types: Cigarettes    Start date: 09/14/1978  . Smokeless tobacco: Never Used  . Tobacco comment: smoking 5 cigs per day  Substance and Sexual Activity  . Alcohol use: Yes     Alcohol/week: 0.0 standard drinks    Comment: rarely  . Drug use: No  . Sexual activity: Never    Birth control/protection: None  Lifestyle  . Physical activity    Days per week: Not on file    Minutes per session: Not on file  . Stress: Not on file  Relationships  . Social Herbalist on phone: Not on file    Gets together: Not on file    Attends religious service: Not on file    Active member of club or organization: Not on file    Attends meetings of clubs or organizations: Not on file    Relationship status: Not on file  Other Topics Concern  . 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  All other ROS are negative.  BP 124/90   Pulse (!) 52   Temp 97.6 F (36.4 C) (Temporal)   Resp 16   Ht 5\' 9"  (1.753 m)   Wt 204 lb (92.5 kg)   SpO2 98%   BMI 30.13 kg/m   Physical Exam HENT:     Head: Normocephalic and atraumatic.     Right Ear: Swelling (of canal) and tenderness (over tragus) present. No drainage.     Left Ear: Tympanic membrane, ear canal and external ear normal. No drainage, swelling or tenderness. There is impacted cerumen.  Eyes:     Conjunctiva/sclera: Conjunctivae normal.     Pupils: Pupils are equal, round, and reactive to light.  Neck:     Musculoskeletal: Neck supple.  Cardiovascular:     Rate and Rhythm: Normal rate and regular rhythm.     Heart sounds: Normal heart sounds.  Pulmonary:     Effort: Pulmonary effort is normal.     Breath sounds: Normal breath sounds.  Lymphadenopathy:     Cervical: No cervical adenopathy.  Skin:    General: Skin is warm  and dry.     Findings: No erythema, signs of injury or rash.        Neurological:     General: No focal deficit present.     Mental Status: He is alert.     Recent Results (from the past 2160 hour(s))  T4, free     Status: None   Collection Time: 06/13/19  8:10 AM  Result Value Ref Range   Free T4 1.27 0.60 - 1.60 ng/dL    Comment: Specimens from patients who are undergoing biotin therapy and /or ingesting biotin supplements may contain high levels of biotin.  The higher biotin concentration in these specimens interferes with this Free T4 assay.  Specimens that contain high levels  of biotin may cause false high results for this Free T4 assay.  Please interpret results in light of the total clinical presentation of the patient.    TSH     Status: None   Collection Time: 06/13/19  8:10 AM  Result Value Ref Range   TSH 1.30 0.35 - 4.50 uIU/mL  Basic metabolic panel     Status: Abnormal   Collection Time: 06/13/19  8:10 AM  Result Value Ref Range   Sodium 132 (L) 135 - 145 mEq/L   Potassium 3.7 3.5 - 5.1 mEq/L   Chloride 97 96 - 112 mEq/L   CO2 26 19 - 32 mEq/L   Glucose, Bld 104 (H) 70 - 99 mg/dL   BUN 9 6 - 23 mg/dL   Creatinine, Ser 0.83 0.40 - 1.50 mg/dL   Calcium 8.9 8.4 - 10.5 mg/dL   GFR 94.35 >60.00 mL/min    Assessment/Plan: No problem-specific Assessment & Plan notes found for this encounter. 1. Other infective acute otitis externa of right ear Right ear with tenderness to palpation and swelling of canal consistent with moderate acute otitis externa, will start Ciprodex Otic suspension. No indication for ear wick at present as canal still patent enough for the drips to reach inner canal. Return precautions discussed, advised to follow-up if he symptoms worsen or fail to improve.   2. Hearing loss of left ear due to cerumen impaction Majority of cerumen flushed out, recommend trial of OTC Debrox drops to help remove the remaining ear wax.   3. Hypopigmentation No concern for vitiligo, likely just an area of mild hypopigmentation.  Advised patient to apply OTC Lamisil to the area in case there is some mild yeast. Reassured patient area does not appear alarming, will likely improve over time.    Leeanne Rio, PA-C

## 2019-08-13 ENCOUNTER — Other Ambulatory Visit: Payer: Self-pay | Admitting: Physician Assistant

## 2019-08-14 ENCOUNTER — Other Ambulatory Visit: Payer: Self-pay | Admitting: Physician Assistant

## 2019-08-14 DIAGNOSIS — E785 Hyperlipidemia, unspecified: Secondary | ICD-10-CM

## 2019-08-14 NOTE — Telephone Encounter (Signed)
Xanax last rx 07/16/19 #45 LOV: 08/02/19

## 2019-08-15 NOTE — Telephone Encounter (Signed)
Last lipid panel was 01/14/18. Patient is due for CPE labs

## 2019-08-21 ENCOUNTER — Ambulatory Visit: Payer: 59 | Admitting: Internal Medicine

## 2019-08-21 ENCOUNTER — Other Ambulatory Visit: Payer: Self-pay

## 2019-08-21 ENCOUNTER — Encounter: Payer: Self-pay | Admitting: Internal Medicine

## 2019-08-21 VITALS — BP 120/72 | HR 64 | Ht 69.0 in | Wt 202.8 lb

## 2019-08-21 DIAGNOSIS — I480 Paroxysmal atrial fibrillation: Secondary | ICD-10-CM

## 2019-08-21 DIAGNOSIS — I10 Essential (primary) hypertension: Secondary | ICD-10-CM | POA: Diagnosis not present

## 2019-08-21 DIAGNOSIS — I498 Other specified cardiac arrhythmias: Secondary | ICD-10-CM | POA: Diagnosis not present

## 2019-08-21 MED ORDER — CARVEDILOL 12.5 MG PO TABS: 18.7500 mg | ORAL_TABLET | Freq: Two times a day (BID) | ORAL | 3 refills | Status: DC

## 2019-08-21 MED ORDER — FLECAINIDE ACETATE 100 MG PO TABS: 100.0000 mg | ORAL_TABLET | Freq: Two times a day (BID) | ORAL | 3 refills | Status: DC

## 2019-08-21 NOTE — Patient Instructions (Signed)

## 2019-08-21 NOTE — Progress Notes (Signed)
HPI Jesse Suarez. Jesse Suarez returns today for followup. He is a pleasant 58 yo man with a h/o PAF, remote Graves disease, HTN, and PVC's. He has been well controlled with regard to his PVC's and his atrial fib. He denies chest pain. He has not had any symptoms. No edema.  Allergies  Allergen Reactions  . Sulfa Drugs Cross Reactors Hives  . Codeine Other (See Comments)    Made him very jittery   . Hydrochlorothiazide Other (See Comments)    Hyponatremia     Current Outpatient Medications  Medication Sig Dispense Refill  . albuterol (PROVENTIL) (2.5 MG/3ML) 0.083% nebulizer solution Take 3 mLs (2.5 mg total) by nebulization every 6 (six) hours as needed for wheezing or shortness of breath. 150 mL 1  . albuterol (VENTOLIN HFA) 108 (90 Base) MCG/ACT inhaler INHALE 2 PUFFS INTO THE LUNGS EVERY 6 HOURS AS NEEDED FOR WHEEZING/SHORTNESS OF BREATH 18 g 2  . ALPRAZolam (XANAX) 1 MG tablet TAKE 1/2 TABLET BY MOUTH 3 TIMES A DAY. NEEDS FOLLOW-UP FOR FURTHER REFILL 45 tablet 0  . aspirin 81 MG tablet Take 81 mg by mouth every other day.     . carvedilol (COREG) 12.5 MG tablet Take 1.5 tablets (18.75 mg total) by mouth 2 (two) times daily. 270 tablet 3  . CHANTIX 1 MG tablet TAKE 1 TABLET BY MOUTH TWICE A DAY 168 tablet 1  . cloNIDine (CATAPRES - DOSED IN MG/24 HR) 0.1 mg/24hr patch PLACE 1 PATCH (0.1 MG TOTAL) ONTO THE SKIN ONCE A WEEK. 12 patch 4  . flecainide (TAMBOCOR) 100 MG tablet Take 1 tablet (100 mg total) by mouth 2 (two) times daily. 180 tablet 3  . FLUoxetine (PROZAC) 40 MG capsule TAKE 2 CAPSULES BY MOUTH DAILY 180 capsule 1  . lisinopril (ZESTRIL) 40 MG tablet TAKE 1 TABLET BY MOUTH EVERY DAY 90 tablet 2  . simvastatin (ZOCOR) 40 MG tablet TAKE 1 TABLET BY MOUTH EVERYDAY AT BEDTIME 90 tablet 1  . SYNTHROID 175 MCG tablet TAKE 1 TABLET BY MOUTH EVERY DAY BEFORE BREAKFAST 90 tablet 1  . tamsulosin (FLOMAX) 0.4 MG CAPS capsule TAKE 1 CAPSULE BY MOUTH EVERY DAY 90 capsule 1   No current  facility-administered medications for this visit.      Past Medical History:  Diagnosis Date  . Allergy   . Anxiety   . Atrial fibrillation ([place])   . Bell palsy   . Depression   . Diastolic heart failure   . Hypercholesterolemia   . Hypertension   . Pneumonia   . Thyroid disease     ROS:   All systems reviewed and negative except as noted in the HPI.   Past Surgical History:  Procedure Laterality Date  . CHOLECYSTECTOMY    . HERNIA REPAIR    . KNEE SURGERY    . VASECTOMY       Family History  Problem Relation Age of Onset  . Heart disease Mother   . Heart attack Mother   . Other Mother        thyroid problems  . Kidney cancer Mother   . Cirrhosis Father   . Other Father        thyroid problems  . Alcohol abuse Father   . Hypertension Father   . Mental illness Brother   . Parkinsonism Brother      Social History   Socioeconomic History  . Marital status: Married    Spouse name: Not on file  .  Number of children: 1  . Years of education: Not on file  . Highest education level: Not on file  Occupational History  . Not on file  Social Needs  . Financial resource strain: Not on file  . Food insecurity    Worry: Not on file    Inability: Not on file  . Transportation needs    Medical: Not on file    Non-medical: Not on file  Tobacco Use  . Smoking status: Current Every Day Smoker    Packs/day: 1.50    Years: 40.00    Pack years: 60.00    Types: Cigarettes    Start date: 09/14/1978  . Smokeless tobacco: Never Used  . Tobacco comment: smoking 5 cigs per day  Substance and Sexual Activity  . Alcohol use: Yes    Alcohol/week: 0.0 standard drinks    Comment: rarely  . Drug use: No  . Sexual activity: Never    Birth control/protection: None  Lifestyle  . Physical activity    Days per week: Not on file    Minutes per session: Not on file  . Stress: Not on file  Relationships  . Social Herbalist on phone: Not on file    Gets  together: Not on file    Attends religious service: Not on file    Active member of club or organization: Not on file    Attends meetings of clubs or organizations: Not on file    Relationship status: Not on file  . Intimate partner violence    Fear of current or ex partner: Not on file    Emotionally abused: Not on file    Physically abused: Not on file    Forced sexual activity: Not on file  Other Topics Concern  . 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      Tobacco: 1 ppd x 30 years      Alcohol: rare     Regular exercise: walking daily   Caffeine use: daily        BP 120/72   Pulse 64   Ht 5\' 9"  (1.753 m)   Wt 202 lb 12.8 oz (92 kg)   SpO2 98%   BMI 29.95 kg/m   Physical Exam:  Well appearing NAD HEENT: Unremarkable Neck:  No JVD, no thyromegally Lymphatics:  No adenopathy Back:  No CVA tenderness Lungs:  Clear with no wheezes HEART:  Regular rate rhythm, no murmurs, no rubs, no clicks Abd:  soft, positive bowel sounds, no organomegally, no rebound, no guarding Ext:  2 plus pulses, no edema, no cyanosis, no clubbing Skin:  No rashes no nodules Neuro:  CN II through XII intact, motor grossly intact  EKG - nsr   Assess/Plan: 1. PAF - he is maintaining NSR nicely on flecaindie. 2. PVC's - he had over 20K and had a nice response to flecainide. He will continue systemic anti-coagualation.  3. Dyslipidemia - he will continue simvastatin.  4. HTN - his bp is well controlled and he will continue coreg.   Mikle Bosworth.D.

## 2019-08-22 NOTE — Telephone Encounter (Signed)
Pt called in asking for a refill on the simvastatin 40mg 

## 2019-09-04 ENCOUNTER — Other Ambulatory Visit: Payer: Self-pay | Admitting: Endocrinology

## 2019-09-14 ENCOUNTER — Other Ambulatory Visit: Payer: Self-pay | Admitting: Physician Assistant

## 2019-09-14 DIAGNOSIS — E785 Hyperlipidemia, unspecified: Secondary | ICD-10-CM

## 2019-09-18 ENCOUNTER — Other Ambulatory Visit: Payer: Self-pay | Admitting: Physician Assistant

## 2019-09-21 ENCOUNTER — Telehealth: Payer: Self-pay | Admitting: Physician Assistant

## 2019-09-21 DIAGNOSIS — E785 Hyperlipidemia, unspecified: Secondary | ICD-10-CM

## 2019-09-21 MED ORDER — SIMVASTATIN 40 MG PO TABS: 40.0000 mg | ORAL_TABLET | Freq: Every day | ORAL | 0 refills | Status: DC

## 2019-09-21 MED ORDER — ALPRAZOLAM 1 MG PO TABS: ORAL_TABLET | ORAL | 0 refills | Status: DC

## 2019-09-21 NOTE — Telephone Encounter (Signed)
Pt notified of temporary refills. Moved appt from 1/12 to 1/11 per pt request.

## 2019-09-21 NOTE — Telephone Encounter (Signed)
Sent in temporary refills until his appt.

## 2019-09-21 NOTE — Telephone Encounter (Signed)
Pt aware he needs a visit. Scheduled visit for 09/26/2019 at 0930. Pt asks if we would call in xanax and simvastatin to hold him over til appt. [id number]

## 2019-09-22 ENCOUNTER — Other Ambulatory Visit: Payer: Self-pay

## 2019-09-25 ENCOUNTER — Other Ambulatory Visit: Payer: Self-pay

## 2019-09-25 ENCOUNTER — Encounter: Payer: Self-pay | Admitting: Physician Assistant

## 2019-09-25 ENCOUNTER — Ambulatory Visit: Payer: 59 | Admitting: Physician Assistant

## 2019-09-25 VITALS — BP 140/90 | HR 59 | Temp 97.5°F | Resp 16 | Ht 69.0 in | Wt 207.0 lb

## 2019-09-25 DIAGNOSIS — E890 Postprocedural hypothyroidism: Secondary | ICD-10-CM | POA: Diagnosis not present

## 2019-09-25 DIAGNOSIS — E785 Hyperlipidemia, unspecified: Secondary | ICD-10-CM

## 2019-09-25 DIAGNOSIS — I480 Paroxysmal atrial fibrillation: Secondary | ICD-10-CM | POA: Diagnosis not present

## 2019-09-25 DIAGNOSIS — I10 Essential (primary) hypertension: Secondary | ICD-10-CM

## 2019-09-25 DIAGNOSIS — F5102 Adjustment insomnia: Secondary | ICD-10-CM

## 2019-09-25 DIAGNOSIS — F418 Other specified anxiety disorders: Secondary | ICD-10-CM

## 2019-09-25 LAB — COMPREHENSIVE METABOLIC PANEL
ALT: 10 U/L (ref 0–53)
AST: 11 U/L (ref 0–37)
Albumin: 3.9 g/dL (ref 3.5–5.2)
Alkaline Phosphatase: 85 U/L (ref 39–117)
BUN: 13 mg/dL (ref 6–23)
CO2: 29 mEq/L (ref 19–32)
Calcium: 8.5 mg/dL (ref 8.4–10.5)
Chloride: 103 mEq/L (ref 96–112)
Creatinine, Ser: 0.85 mg/dL (ref 0.40–1.50)
GFR: 91.7 mL/min (ref >60.00)
Glucose, Bld: 103 mg/dL — ABNORMAL HIGH (ref 70–99)
Potassium: 4.1 mEq/L (ref 3.5–5.1)
Sodium: 137 mEq/L (ref 135–145)
Total Bilirubin: 0.4 mg/dL (ref 0.2–1.2)
Total Protein: 6.2 g/dL (ref 6.0–8.3)

## 2019-09-25 LAB — LIPID PANEL
Cholesterol: 129 mg/dL (ref 0–200)
HDL: 42.1 mg/dL (ref >39.00)
LDL Cholesterol: 70 mg/dL (ref 0–99)
NonHDL: 87.38
Total CHOL/HDL Ratio: 3
Triglycerides: 85 mg/dL (ref 0.0–149.0)
VLDL: 17 mg/dL (ref 0.0–40.0)

## 2019-09-25 LAB — TSH: TSH: 0.02 u[IU]/mL — ABNORMAL LOW (ref 0.35–4.50)

## 2019-09-25 MED ORDER — BELSOMRA 10 MG PO TABS: 10.0000 mg | ORAL_TABLET | Freq: Every day | ORAL | 1 refills | Status: DC

## 2019-09-25 NOTE — Progress Notes (Signed)
Patient presents to clinic today for follow-up of chronic medical issues.  Hypertension/Atrial Fibrillation -- Patient currently on a regimen of Flecainide 100 mg BID, Carvedilol 12.5 mg BID and Lisinopril 40 mg QD. Endorses taking medications as directed but has not taken Carvedilol or Lisinopril yet this morning. Patient denies chest pain, palpitations, lightheadedness, dizziness, vision changes or frequent headaches.  BP Readings from Last 3 Encounters:  09/25/19 140/90  08/21/19 120/72  08/02/19 124/90   Hyperlipidemia -- Patient currently on Simvastatin 40 mg daily. Endorses taking as directed. No regular exercise but tries to keep more active. Is fasting for repeat lipids this morning.  Hypothyroidism -- Managed by Endocrinology but patient overdue for TSH check. Is currently on Synthroid 175 mcg daily.   Depression and Anxiety -- Currently on Fluoxetine 80 mg daily and Alprazolam 0.5 mg up to TID PRN. Notes due to recent stressors (wife hospitalized, son-in-law passed away from [place], family friend passed from [place] complications) that sleep has been significantly affected. Endorses issue with both sleep onset and sleep maintenance. Alprazolam is helping with acute anxiety. Mood hanging in there overall. Denies SI/HI.  Past Medical History:  Diagnosis Date  . Allergy   . Anxiety   . Atrial fibrillation ([place])   . Bell palsy   . Depression   . Diastolic heart failure   . Hypercholesterolemia   . Hypertension   . Pneumonia   . Thyroid disease     Current Outpatient Medications on File Prior to Visit  Medication Sig Dispense Refill  . albuterol (PROVENTIL) (2.5 MG/3ML) 0.083% nebulizer solution Take 3 mLs (2.5 mg total) by nebulization every 6 (six) hours as needed for wheezing or shortness of breath. 150 mL 1  . albuterol (VENTOLIN HFA) 108 (90 Base) MCG/ACT inhaler INHALE 2 PUFFS INTO THE LUNGS EVERY 6 HOURS AS NEEDED FOR WHEEZING/SHORTNESS OF BREATH 18 g 2  . ALPRAZolam  (XANAX) 1 MG tablet TAKE 1/2 TABLET BY MOUTH 3 TIMES A DAY. NEEDS FOLLOW-UP FOR FURTHER REFILL 15 tablet 0  . aspirin 81 MG tablet Take 81 mg by mouth every other day.     . carvedilol (COREG) 12.5 MG tablet Take 1.5 tablets (18.75 mg total) by mouth 2 (two) times daily. 270 tablet 3  . CHANTIX 1 MG tablet TAKE 1 TABLET BY MOUTH TWICE A DAY 168 tablet 1  . cloNIDine (CATAPRES - DOSED IN MG/24 HR) 0.1 mg/24hr patch PLACE 1 PATCH (0.1 MG TOTAL) ONTO THE SKIN ONCE A WEEK. 12 patch 4  . flecainide (TAMBOCOR) 100 MG tablet Take 1 tablet (100 mg total) by mouth 2 (two) times daily. 180 tablet 3  . FLUoxetine (PROZAC) 40 MG capsule TAKE 2 CAPSULES BY MOUTH DAILY 180 capsule 1  . lisinopril (ZESTRIL) 40 MG tablet TAKE 1 TABLET BY MOUTH EVERY DAY 90 tablet 2  . simvastatin (ZOCOR) 40 MG tablet Take 1 tablet (40 mg total) by mouth daily. Please schedule a physical for refills 30 tablet 0  . SYNTHROID 175 MCG tablet TAKE 1 TABLET BY MOUTH EVERY DAY BEFORE BREAKFAST 90 tablet 1  . tamsulosin (FLOMAX) 0.4 MG CAPS capsule TAKE 1 CAPSULE BY MOUTH EVERY DAY 90 capsule 1   No current facility-administered medications on file prior to visit.    Allergies  Allergen Reactions  . Sulfa Drugs Cross Reactors Hives  . Codeine Other (See Comments)    Made him very jittery   . Hydrochlorothiazide Other (See Comments)    Hyponatremia    Family  History  Problem Relation Age of Onset  . Heart disease Mother   . Heart attack Mother   . Other Mother        thyroid problems  . Kidney cancer Mother   . Cirrhosis Father   . Other Father        thyroid problems  . Alcohol abuse Father   . Hypertension Father   . Mental illness Brother   . Parkinsonism Brother     Social History   Socioeconomic History  . Marital status: Married    Spouse name: Not on file  . Number of children: 1  . Years of education: Not on file  . Highest education level: Not on file  Occupational History  . Not on file  Tobacco  Use  . Smoking status: Current Every Day Smoker    Packs/day: 1.50    Years: 40.00    Pack years: 60.00    Types: Cigarettes    Start date: 09/14/1978  . Smokeless tobacco: Never Used  . Tobacco comment: smoking 5 cigs per day  Substance and Sexual Activity  . Alcohol use: Yes    Alcohol/week: 0.0 standard drinks    Comment: rarely  . Drug use: No  . Sexual activity: Never    Birth control/protection: None  Other Topics Concern  . 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 Difficulty of Paying Living Expenses: Not on file  Food Insecurity:   . Worried About Charity fundraiser in the Last Year: Not on file  . Ran Out of Food in the Last Year: Not on file  Transportation Needs:   . Lack of Transportation (Medical): Not on file  . Lack of Transportation (Non-Medical): Not on file  Physical Activity:   . Days of Exercise per Week: Not on file  . Minutes of Exercise per Session: Not on file  Stress:   . Feeling of Stress : Not on file  Social Connections:   . Frequency of Communication with Friends and Family: Not on file  . Frequency of Social Gatherings with Friends and Family: Not on file  . Attends Religious Services: Not on file  . Active Member of Clubs or Organizations: Not on file  . Attends Archivist Meetings: Not on file  . Marital Status: Not on file   Review of Systems - See HPI.  All other ROS are negative.  BP 140/90   Pulse (!) 59   Temp (!) 97.5 F (36.4 C) (Temporal)   Resp 16   Ht 5\' 9"  (1.753 m)   Wt 207 lb (93.9 kg)   SpO2 98%   BMI 30.57 kg/m   Physical Exam Vitals reviewed.    Constitutional:      Appearance: Normal appearance.  HENT:     Head: Normocephalic and atraumatic.  Cardiovascular:     Rate and Rhythm: Normal rate and regular rhythm.     Pulses: Normal pulses.  Heart sounds: Normal heart sounds.  Pulmonary:     Effort: Pulmonary effort is normal.     Breath sounds: Normal breath sounds.  Musculoskeletal:     Cervical back: Neck supple.  Neurological:     General: No focal deficit present.     Mental Status: He is alert.  Psychiatric:        Mood and Affect: Mood normal.    Assessment/Plan: 1. Essential hypertension 2. Paroxysmal atrial fibrillation (HCC) Regular rate this AM. NSR at time of visit. BP above goal but patient has not taken medications yet this morning. He is to take when he gets home. Keep DASH diet. Increase fluids. Exercise recommendations reviewed. Will check CMP today. Patient to follow-up with Cardiology as scheduled.   3. Dyslipidemia Taking statin as directed. Dietary and exercise recommendations reviewed. Repeat fasting lipids today - Lipid Profile  4. Hypothyroidism following radioiodine therapy Repeat TSH today - TSH  5. Depression with anxiety 6. Adjustment insomnia Lots of recent stressors. Will continue Fluoxetine and Alprazolam. Will start trial of Belsomra 10 mg for insomnia. Free trial voucher discussed (patient to print out since we are out of physical copies). Follow-up via MyChart in 7-10 days for further management.   This visit occurred during the SARS-CoV-2 public health emergency.  Safety protocols were in place, including screening questions prior to the visit, additional usage of staff PPE, and extensive cleaning of exam room while observing appropriate contact time as indicated for disinfecting solutions.     Leeanne Rio, PA-C

## 2019-09-25 NOTE — Patient Instructions (Addendum)
Please go to the lab today for blood work.  I will call you with your results. We will alter treatment regimen(s) if indicated by your results.   Please start the Belsomra at night, taking as directed.  You can download a free trial voucher offline and [email or web address].   Please continue medications as directed, keeping low salt intake. I believe BP is up slightly due to your stress levels!  Follow-up with me via phone or MyChart in 7-10 days to let me know how sleep is going.

## 2019-09-26 ENCOUNTER — Ambulatory Visit: Payer: 59 | Admitting: Physician Assistant

## 2019-09-26 ENCOUNTER — Other Ambulatory Visit (INDEPENDENT_AMBULATORY_CARE_PROVIDER_SITE_OTHER): Payer: 59

## 2019-09-26 DIAGNOSIS — R7989 Other specified abnormal findings of blood chemistry: Secondary | ICD-10-CM

## 2019-09-26 LAB — T4, FREE: Free T4: 1.56 ng/dL (ref 0.60–1.60)

## 2019-09-26 LAB — T3, FREE: T3, Free: 3.3 pg/mL (ref 2.3–4.2)

## 2019-09-28 ENCOUNTER — Other Ambulatory Visit: Payer: Self-pay | Admitting: *Deleted

## 2019-09-28 MED ORDER — ALPRAZOLAM 1 MG PO TABS: ORAL_TABLET | ORAL | 0 refills | Status: DC

## 2019-09-28 NOTE — Telephone Encounter (Signed)
Rx Xanax was send to pharmacy on 09/21/2019 #15 Regular Rx Xanax is #45

## 2019-09-30 ENCOUNTER — Other Ambulatory Visit: Payer: Self-pay

## 2019-09-30 ENCOUNTER — Encounter (HOSPITAL_COMMUNITY): Payer: Self-pay | Admitting: *Deleted

## 2019-09-30 ENCOUNTER — Emergency Department (HOSPITAL_COMMUNITY)
Admission: EM | Admit: 2019-09-30 | Discharge: 2019-09-30 | Disposition: A | Discharge status: Home / Self Care | Payer: No Typology Code available for payment source | Attending: Emergency Medicine | Admitting: Emergency Medicine

## 2019-09-30 ENCOUNTER — Emergency Department (HOSPITAL_COMMUNITY): Payer: No Typology Code available for payment source

## 2019-09-30 DIAGNOSIS — E039 Hypothyroidism, unspecified: Secondary | ICD-10-CM | POA: Insufficient documentation

## 2019-09-30 DIAGNOSIS — I5030 Unspecified diastolic (congestive) heart failure: Secondary | ICD-10-CM | POA: Diagnosis not present

## 2019-09-30 DIAGNOSIS — Z79899 Other long term (current) drug therapy: Secondary | ICD-10-CM | POA: Insufficient documentation

## 2019-09-30 DIAGNOSIS — M542 Cervicalgia: Secondary | ICD-10-CM | POA: Diagnosis not present

## 2019-09-30 DIAGNOSIS — Y93I9 Activity, other involving external motion: Secondary | ICD-10-CM | POA: Diagnosis not present

## 2019-09-30 DIAGNOSIS — J449 Chronic obstructive pulmonary disease, unspecified: Secondary | ICD-10-CM | POA: Insufficient documentation

## 2019-09-30 DIAGNOSIS — R197 Diarrhea, unspecified: Secondary | ICD-10-CM

## 2019-09-30 DIAGNOSIS — I110 Hypertensive heart disease with heart failure: Secondary | ICD-10-CM | POA: Diagnosis not present

## 2019-09-30 DIAGNOSIS — I959 Hypotension, unspecified: Secondary | ICD-10-CM

## 2019-09-30 DIAGNOSIS — Z7982 Long term (current) use of aspirin: Secondary | ICD-10-CM | POA: Insufficient documentation

## 2019-09-30 DIAGNOSIS — I4891 Unspecified atrial fibrillation: Secondary | ICD-10-CM | POA: Insufficient documentation

## 2019-09-30 DIAGNOSIS — Y999 Unspecified external cause status: Secondary | ICD-10-CM | POA: Diagnosis not present

## 2019-09-30 DIAGNOSIS — F17210 Nicotine dependence, cigarettes, uncomplicated: Secondary | ICD-10-CM | POA: Diagnosis not present

## 2019-09-30 DIAGNOSIS — Y92410 Unspecified street and highway as the place of occurrence of the external cause: Secondary | ICD-10-CM | POA: Insufficient documentation

## 2019-09-30 LAB — CBC WITH DIFFERENTIAL/PLATELET
Abs Immature Granulocytes: 0.05 10*3/uL (ref 0.00–0.07)
Basophils Absolute: 0 10*3/uL (ref 0.0–0.1)
Basophils Relative: 0 %
Eosinophils Absolute: 0 10*3/uL (ref 0.0–0.5)
Eosinophils Relative: 0 %
HCT: 41.2 % (ref 39.0–52.0)
Hemoglobin: 13.5 g/dL (ref 13.0–17.0)
Immature Granulocytes: 1 %
Lymphocytes Relative: 8 %
Lymphs Abs: 0.8 10*3/uL (ref 0.7–4.0)
MCH: 31 pg (ref 26.0–34.0)
MCHC: 32.8 g/dL (ref 30.0–36.0)
MCV: 94.5 fL (ref 80.0–100.0)
Monocytes Absolute: 1.4 10*3/uL — ABNORMAL HIGH (ref 0.1–1.0)
Monocytes Relative: 15 %
Neutro Abs: 7.1 10*3/uL (ref 1.7–7.7)
Neutrophils Relative %: 76 %
Platelets: 158 10*3/uL (ref 150–400)
RBC: 4.36 MIL/uL (ref 4.22–5.81)
RDW: 13.2 % (ref 11.5–15.5)
WBC: 9.3 10*3/uL (ref 4.0–10.5)
nRBC: 0 % (ref 0.0–0.2)

## 2019-09-30 LAB — BASIC METABOLIC PANEL
Anion gap: 8 (ref 5–15)
BUN: 10 mg/dL (ref 6–20)
CO2: 25 mmol/L (ref 22–32)
Calcium: 8.1 mg/dL — ABNORMAL LOW (ref 8.9–10.3)
Chloride: 101 mmol/L (ref 98–111)
Creatinine, Ser: 0.97 mg/dL (ref 0.61–1.24)
GFR calc Af Amer: >60 mL/min (ref >60)
GFR calc non Af Amer: >60 mL/min (ref >60)
Glucose, Bld: 98 mg/dL (ref 70–99)
Potassium: 3.4 mmol/L — ABNORMAL LOW (ref 3.5–5.1)
Sodium: 134 mmol/L — ABNORMAL LOW (ref 135–145)

## 2019-09-30 LAB — TYPE AND SCREEN
ABO/RH(D): O POS
Antibody Screen: NEGATIVE

## 2019-09-30 MED ORDER — SODIUM CHLORIDE 0.9 % IV BOLUS
1000.0000 mL | Freq: Once | INTRAVENOUS | Status: AC
  Administered 2019-09-30: 1000 mL via INTRAVENOUS
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
## 2019-09-30 NOTE — Discharge Instructions (Addendum)
Test showed no life-threatening condition.  Blood work was good.  Increase fluids.  Follow-up with your primary care doctor.
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
## 2019-10-02 ENCOUNTER — Telehealth: Payer: Self-pay | Admitting: Physician Assistant

## 2019-10-02 NOTE — Telephone Encounter (Signed)
Pt called in stating that his wife is getting discharged from the hospital they told him that she has covid, he wants to know if he should go get tested. The last time he was around her was Thursday or Friday and today. Please advise.

## 2019-10-02 NOTE — Telephone Encounter (Signed)
Please advise 

## 2019-10-02 NOTE — Telephone Encounter (Signed)
Is he currently having any symptoms?  If not would recommend quarantining at home and have him consider getting tested once his wife is finished with her quarantine.  If he is symptomatic then he likely has Covid.  Can consider testing but would really need a video visit with me for assessment.

## 2019-10-04 ENCOUNTER — Other Ambulatory Visit: Payer: Self-pay | Admitting: Physician Assistant

## 2019-10-04 NOTE — Telephone Encounter (Signed)
Patient is scheduled to have COVID test tomorrow at [facility]

## 2019-10-05 ENCOUNTER — Ambulatory Visit: Payer: 59 | Attending: Internal Medicine

## 2019-10-05 ENCOUNTER — Other Ambulatory Visit: Payer: Self-pay

## 2019-10-05 DIAGNOSIS — Z20822 Contact with and (suspected) exposure to covid-19: Secondary | ICD-10-CM

## 2019-10-06 ENCOUNTER — Telehealth: Payer: Self-pay

## 2019-10-06 LAB — NOVEL CORONAVIRUS, NAA: SARS-CoV-2, NAA: DETECTED — AB

## 2019-10-06 NOTE — Telephone Encounter (Signed)
Patient called and he was informed that his COVID-19 test, 10/05/19, was still active and had not resulted. He verbalized understanding and will call later

## 2019-10-06 NOTE — Telephone Encounter (Signed)
Patient called and he was informed that his test for COVID-19 10/05/19 was still active. No result was available. He verbalized understanding and will call back.

## 2019-10-07 ENCOUNTER — Telehealth: Payer: Self-pay | Admitting: Physician Assistant

## 2019-10-07 ENCOUNTER — Other Ambulatory Visit: Payer: Self-pay | Admitting: Physician Assistant

## 2019-10-07 DIAGNOSIS — U071 COVID-19: Secondary | ICD-10-CM

## 2019-10-07 DIAGNOSIS — I10 Essential (primary) hypertension: Secondary | ICD-10-CM

## 2019-10-07 DIAGNOSIS — I480 Paroxysmal atrial fibrillation: Secondary | ICD-10-CM

## 2019-10-07 DIAGNOSIS — J449 Chronic obstructive pulmonary disease, unspecified: Secondary | ICD-10-CM

## 2019-10-07 NOTE — Telephone Encounter (Signed)
Patient is calling back- he has changed his mind and would like to schedule infusion

## 2019-10-07 NOTE — Telephone Encounter (Signed)
Thank you, I will call him back. Thank you!

## 2019-10-07 NOTE — Progress Notes (Signed)
  I connected by phone with Jesse Europe [name] on 10/07/2019 at 1:16 PM to discuss the potential use of an new treatment for mild to moderate COVID-19 viral infection in non-hospitalized patients.  This patient is a 59 y.o. male that meets the FDA criteria for Emergency Use Authorization of bamlanivimab or casirivimab\imdevimab.  Has a (+) direct SARS-CoV-2 viral test result  Has mild or moderate COVID-19   Is ? 61 years of age and weighs ? 40 kg  Is NOT hospitalized due to COVID-19  Is NOT requiring oxygen therapy or requiring an increase in baseline oxygen flow rate due to COVID-19  Is within 10 days of symptom onset  Has at least one of the high risk factor(s) for progression to severe COVID-19 and/or hospitalization as defined in EUA.  Specific high risk criteria : Cardiovascular Disease, HTN, COPD   I have spoken and communicated the following to the patient or parent/caregiver:  1. FDA has authorized the emergency use of bamlanivimab and casirivimab\imdevimab for the treatment of mild to moderate COVID-19 in adults and pediatric patients with positive results of direct SARS-CoV-2 viral testing who are 62 years of age and older weighing at least 40 kg, and who are at high risk for progressing to severe COVID-19 and/or hospitalization.  2. The significant known and potential risks and benefits of bamlanivimab and casirivimab\imdevimab, and the extent to which such potential risks and benefits are unknown.  3. Information on available alternative treatments and the risks and benefits of those alternatives, including clinical trials.  4. Patients treated with bamlanivimab and casirivimab\imdevimab should continue to self-isolate and use infection control measures (e.g., wear mask, isolate, social distance, avoid sharing personal items, clean and disinfect "high touch" surfaces, and frequent handwashing) according to CDC guidelines.   5. The patient or parent/caregiver has the option  to accept or refuse bamlanivimab or casirivimab\imdevimab .  After reviewing this information with the patient, The patient agreed to proceed with receiving the casirivimab\imdevimab infusion and will be provided a copy of the Fact sheet prior to receiving the infusion.   Pt is set up for 10/11/19 @ 10:30am. He has my chart but does not know how to sign on, so directions were given over the phone.   Angelena Form 10/07/2019 1:16 PM

## 2019-10-07 NOTE — Telephone Encounter (Signed)
Called to discuss with Jesse Europe [name] about Covid symptoms and the use of bamlanivimab, a monoclonal antibody infusion for those with mild to moderate Covid symptoms and at a high risk of hospitalization.     Pt is qualified for this infusion at the [facility] infusion center due to co-morbid conditions and/or a member of an at-risk group, however declines infusion at this time. Symptoms tier reviewed as well as criteria for ending isolation.  Symptoms reviewed that would warrant ED/Hospital evaluation. Preventative practices reviewed. Patient verbalized understanding. Patient advised to call back if he decides that he does want to get infusion. Callback number to the infusion center given. Patient advised to go to Urgent care or ED with severe symptoms. Last date he would be eligible for infusion is 10/12/19.     Patient Active Problem List   Diagnosis Date Noted  . COPD GOLD 0/ emphysema on CT  03/15/2019  . Visit for preventive health examination 02/03/2018  . Prostate cancer screening 02/03/2018  . Colon cancer screening 02/03/2018  . Need for Tdap vaccination 02/03/2018  . Ventricular trigeminy 01/06/2017  . Bone neoplasm 02/25/2015  . Cigarette smoker 08/07/2013  . Depression with anxiety 02/20/2012  . ED (erectile dysfunction) 02/20/2012  . Hx of Bell's palsy 02/20/2012  . Amputated toe ([place]) 02/20/2012  . Paroxysmal atrial fibrillation ([place]) 01/15/2011  . Hypothyroidism following radioiodine therapy 01/15/2011  . Essential hypertension 01/15/2011  . Dyslipidemia 01/15/2011     Angelena Form PA-C  [facility]

## 2019-10-11 ENCOUNTER — Ambulatory Visit (HOSPITAL_COMMUNITY)
Admission: RE | Admit: 2019-10-11 | Discharge: 2019-10-11 | Disposition: A | Discharge status: Home / Self Care | Payer: 59 | Source: Ambulatory Visit | Attending: Pulmonary Disease | Admitting: Pulmonary Disease

## 2019-10-11 DIAGNOSIS — I10 Essential (primary) hypertension: Secondary | ICD-10-CM

## 2019-10-11 DIAGNOSIS — Z23 Encounter for immunization: Secondary | ICD-10-CM | POA: Diagnosis not present

## 2019-10-11 DIAGNOSIS — U071 COVID-19: Secondary | ICD-10-CM

## 2019-10-11 DIAGNOSIS — J449 Chronic obstructive pulmonary disease, unspecified: Secondary | ICD-10-CM

## 2019-10-11 DIAGNOSIS — I480 Paroxysmal atrial fibrillation: Secondary | ICD-10-CM | POA: Diagnosis present

## 2019-10-11 MED ORDER — EPINEPHRINE 0.3 MG/0.3ML IJ SOAJ: 0.3000 mg | Freq: Once | INTRAMUSCULAR | Status: DC | PRN

## 2019-10-11 MED ORDER — SODIUM CHLORIDE 0.9 % IV SOLN
INTRAVENOUS | Status: DC | PRN
  Administered 2019-10-11: 10:00:00 250 mL via INTRAVENOUS

## 2019-10-11 MED ORDER — SODIUM CHLORIDE 0.9 % IV SOLN
Freq: Once | INTRAVENOUS | Status: AC
  Filled 2019-10-11: qty 10

## 2019-10-11 MED ORDER — ALBUTEROL SULFATE HFA 108 (90 BASE) MCG/ACT IN AERS: 2.0000 | INHALATION_SPRAY | Freq: Once | RESPIRATORY_TRACT | Status: DC | PRN

## 2019-10-11 MED ORDER — FAMOTIDINE IN NACL 20-0.9 MG/50ML-% IV SOLN: 20.0000 mg | Freq: Once | INTRAVENOUS | Status: DC | PRN

## 2019-10-11 MED ORDER — DIPHENHYDRAMINE HCL 50 MG/ML IJ SOLN: 50.0000 mg | Freq: Once | INTRAMUSCULAR | Status: DC | PRN

## 2019-10-11 MED ORDER — METHYLPREDNISOLONE SODIUM SUCC 125 MG IJ SOLR: 125.0000 mg | Freq: Once | INTRAMUSCULAR | Status: DC | PRN

## 2019-10-11 NOTE — Progress Notes (Signed)
  Diagnosis: COVID-19  Physician: Dr Wright  Procedure: Covid Infusion Clinic Med: casirivimab\imdevimab infusion - Provided patient with casirivimab\imdevimab fact sheet for patients, parents and caregivers prior to infusion.  Complications: No immediate complications noted.  Discharge: Discharged home     10/11/2019   

## 2019-10-11 NOTE — Discharge Instructions (Signed)

## 2019-10-13 ENCOUNTER — Other Ambulatory Visit: Payer: Self-pay | Admitting: Physician Assistant

## 2019-10-13 DIAGNOSIS — E785 Hyperlipidemia, unspecified: Secondary | ICD-10-CM

## 2019-10-17 ENCOUNTER — Other Ambulatory Visit: Payer: Self-pay | Admitting: Physician Assistant

## 2019-10-17 NOTE — Telephone Encounter (Signed)
Refill Rx Atrovent , Rx discontinue in Med hx Last OV 09/25/2019 for HTN

## 2019-10-18 ENCOUNTER — Other Ambulatory Visit: Payer: Self-pay | Admitting: Endocrinology

## 2019-10-20 ENCOUNTER — Other Ambulatory Visit: Payer: Self-pay | Admitting: Physician Assistant

## 2019-10-20 DIAGNOSIS — G4700 Insomnia, unspecified: Secondary | ICD-10-CM

## 2019-10-20 MED ORDER — BELSOMRA 15 MG PO TABS: 1.0000 | ORAL_TABLET | Freq: Every day | ORAL | 0 refills | Status: DC

## 2019-10-20 NOTE — Telephone Encounter (Signed)
Patient was started on Belsmora 10 mg. Belsomra 15 mg pended.

## 2019-10-20 NOTE — Telephone Encounter (Signed)
Called patient and lmovm that the Belsomra 10 mg is not helping with sleep, PCP increased the dose to 15 mg. Patient should still have a co-pay card to get pills for free. Notify the office once completed to determine if current dose is working or needs to be increased again.

## 2019-10-20 NOTE — Telephone Encounter (Signed)
Pt states that [facility] requested he call in with results of new sleep med. Per pt, he has taken the [place] for 10 days and it has not helped at all with his ability to sleep.

## 2019-10-21 ENCOUNTER — Other Ambulatory Visit: Payer: Self-pay | Admitting: Physician Assistant

## 2019-10-21 DIAGNOSIS — N401 Enlarged prostate with lower urinary tract symptoms: Secondary | ICD-10-CM

## 2019-10-23 ENCOUNTER — Other Ambulatory Visit: Payer: Self-pay | Admitting: Physician Assistant

## 2019-10-23 NOTE — Telephone Encounter (Signed)
Xanax last rx 09/28/19 #45 LOV: 09/25/19 Follow up [facility]: 12/14/17

## 2019-10-31 ENCOUNTER — Other Ambulatory Visit: Payer: Self-pay | Admitting: Physician Assistant

## 2019-11-01 ENCOUNTER — Telehealth: Payer: Self-pay

## 2019-11-01 MED ORDER — ALPRAZOLAM 1 MG PO TABS: ORAL_TABLET | ORAL | 1 refills | Status: DC

## 2019-11-01 NOTE — Addendum Note (Signed)
Addended by: Brunetta Jeans on: 11/01/2019 02:05 PM   Modules accepted: Orders

## 2019-11-01 NOTE — Telephone Encounter (Signed)
Patient states he tried to contact [facility] regarding his alprazolam and he states they told him he needed to contact our office to get an office visit.  Last office visit 09/25/19 No future visit scheduled Alprazolam last filled 10/23/19 #15 with 1 refill Patient takes 1/2 tab  TID  Please advise.

## 2019-11-01 NOTE — Telephone Encounter (Signed)
I sent in new Rx for his total quantity of 45 pills per month

## 2019-11-01 NOTE — Telephone Encounter (Signed)
Spoke with [facility], patient does have a refill but it is only 15 pills.

## 2019-11-01 NOTE — Telephone Encounter (Signed)
Left a vm message informing the patient his prescription was sent to his pharmacy.

## 2019-12-19 ENCOUNTER — Other Ambulatory Visit: Payer: Self-pay

## 2019-12-19 ENCOUNTER — Other Ambulatory Visit (INDEPENDENT_AMBULATORY_CARE_PROVIDER_SITE_OTHER): Payer: 59

## 2019-12-19 DIAGNOSIS — E871 Hypo-osmolality and hyponatremia: Secondary | ICD-10-CM

## 2019-12-19 DIAGNOSIS — E890 Postprocedural hypothyroidism: Secondary | ICD-10-CM | POA: Diagnosis not present

## 2019-12-19 LAB — BASIC METABOLIC PANEL
BUN: 8 mg/dL (ref 6–23)
CO2: 30 mEq/L (ref 19–32)
Calcium: 8.5 mg/dL (ref 8.4–10.5)
Chloride: 99 mEq/L (ref 96–112)
Creatinine, Ser: 0.83 mg/dL (ref 0.40–1.50)
GFR: 94.18 mL/min (ref >60.00)
Glucose, Bld: 100 mg/dL — ABNORMAL HIGH (ref 70–99)
Potassium: 4 mEq/L (ref 3.5–5.1)
Sodium: 134 mEq/L — ABNORMAL LOW (ref 135–145)

## 2019-12-19 LAB — T4, FREE: Free T4: 1.31 ng/dL (ref 0.60–1.60)

## 2019-12-19 LAB — TSH: TSH: 0.01 u[IU]/mL — ABNORMAL LOW (ref 0.35–4.50)

## 2019-12-21 ENCOUNTER — Ambulatory Visit: Payer: 59 | Admitting: Endocrinology

## 2019-12-21 ENCOUNTER — Encounter: Payer: Self-pay | Admitting: Endocrinology

## 2019-12-21 ENCOUNTER — Other Ambulatory Visit: Payer: Self-pay

## 2019-12-21 VITALS — BP 162/90 | HR 70 | Ht 69.0 in | Wt 198.0 lb

## 2019-12-21 DIAGNOSIS — I10 Essential (primary) hypertension: Secondary | ICD-10-CM

## 2019-12-21 DIAGNOSIS — E890 Postprocedural hypothyroidism: Secondary | ICD-10-CM

## 2019-12-21 MED ORDER — LEVOTHYROXINE SODIUM 150 MCG PO TABS: 150.0000 ug | ORAL_TABLET | Freq: Every day | ORAL | 1 refills | Status: DC

## 2019-12-21 NOTE — Progress Notes (Signed)
Patient ID: Jesse Europe [name], male   DOB: Jan 18, 1961, 59 y.o.   MRN: [id number]   Reason for Appointment: Follow-up    History of Present Illness:   The hypothyroidism was first diagnosed in 11/13. Previously had I-131 treatment for Graves' disease He was initially started with 88 mcg but his dose needed to be increased progressively  He has been treated with brand name SYNTHROID, the dose has been adjusted previously based on his labs  He is taking 175 mcg, 6-1/2 tablets weekly  Although he had a higher TSH in 03/2019 and extra half tablet weekly was added his subsequent TSH levels appear to be low It was last checked by PCP in 1/21 but no changes made TSH is now 0.01 without increased TSH but this has been higher than usual  He appears to have had some weight loss, likely during his Covid infection Recently not complaining of unusual fatigue/change in appetite  He is taking his Synthroid regularly before breakfast as before  Labs as follows  Wt Readings from Last 3 Encounters:  12/21/19 198 lb (89.8 kg)  09/30/19 207 lb (93.9 kg)  09/25/19 207 lb (93.9 kg)    LABS:   Lab Results  Component Value Date   TSH 0.01 (L) 12/19/2019   TSH 0.02 (L) 09/25/2019   TSH 1.30 06/13/2019   FREET4 1.31 12/19/2019   FREET4 1.56 09/26/2019   FREET4 1.27 06/13/2019      HYPERTENSION:  He has had long-standing hypertension which is  managed with lisinopril 40, Catapres 0.1 mg patch and Coreg 1-1/2 tablets twice a day He was told to stop hydrochlorothiazide which was causing hyponatremia  He was evaluated for secondary hypertension and reports of his aldosterone/renin indicate normal levels Also was supposed to get the renal artery ultrasound but this was not done  He has checked his blood pressure periodically He has a arm monitor but has not been compared for accuracy  Blood pressure reportedly at home still about [id number] systolic and was not high in January However  blood pressure was high checked twice today on both arms  He finally admitted that he did not change his clonidine patch when it was due about 4 days ago and only changed yesterday  Also follows up with PCP periodically  BP Readings from Last 3 Encounters:  12/21/19 (!) 162/90  10/11/19 (!) 176/92  09/30/19 120/72       Allergies as of 12/21/2019      Reactions   Sulfa Drugs Cross Reactors Hives   Codeine Other (See Comments)   Made him very jittery    Hydrochlorothiazide Other (See Comments)   Hyponatremia      Medication List       Accurate as of December 21, 2019  1:59 PM. If you have any questions, ask your nurse or doctor.        albuterol (2.5 MG/3ML) 0.083% nebulizer solution Commonly known as: PROVENTIL Take 3 mLs (2.5 mg total) by nebulization every 6 (six) hours as needed for wheezing or shortness of breath.   albuterol 108 (90 Base) MCG/ACT inhaler Commonly known as: VENTOLIN HFA INHALE 2 PUFFS INTO THE LUNGS EVERY 6 HOURS AS NEEDED FOR WHEEZING/SHORTNESS OF BREATH   ALPRAZolam 1 MG tablet Commonly known as: XANAX TAKE 1/2 TABLET BY MOUTH 3 TIMES A DAY.   aspirin 81 MG tablet Take 81 mg by mouth every other day.   Belsomra 15 MG Tabs Generic drug: Suvorexant Take 1 tablet  by mouth at bedtime.   carvedilol 12.5 MG tablet Commonly known as: COREG Take 1.5 tablets (18.75 mg total) by mouth 2 (two) times daily.   Chantix 1 MG tablet Generic drug: varenicline TAKE 1 TABLET BY MOUTH TWICE A DAY   cloNIDine 0.1 mg/24hr patch Commonly known as: CATAPRES - Dosed in mg/24 hr PLACE 1 PATCH (0.1 MG TOTAL) ONTO THE SKIN ONCE A WEEK.   flecainide 100 MG tablet Commonly known as: TAMBOCOR Take 1 tablet (100 mg total) by mouth 2 (two) times daily.   FLUoxetine 40 MG capsule Commonly known as: PROZAC TAKE 2 CAPSULES BY MOUTH EVERY DAY   ipratropium 0.03 % nasal spray Commonly known as: ATROVENT PLACE 2 SPRAYS INTO BOTH NOSTRILS 2 (TWO) TIMES DAILY. USE 2  SPRAYS INTO BOTH NOSTRIL 3 TIMES DAILY   levothyroxine 175 MCG tablet Commonly known as: Synthroid 1 tablet daily before breakfast and extra half tablet once a week   lisinopril 40 MG tablet Commonly known as: ZESTRIL TAKE 1 TABLET BY MOUTH EVERY DAY   simvastatin 40 MG tablet Commonly known as: ZOCOR TAKE 1 TABLET (40 MG TOTAL) BY MOUTH DAILY. PLEASE SCHEDULE A PHYSICAL FOR REFILLS   tamsulosin 0.4 MG Caps capsule Commonly known as: FLOMAX TAKE 1 CAPSULE BY MOUTH EVERY DAY       Past Medical History:  Diagnosis Date  . Allergy   . Anxiety   . Atrial fibrillation ([place])   . Bell palsy   . Depression   . Diastolic heart failure   . Hypercholesterolemia   . Hypertension   . Pneumonia   . Thyroid disease     Past Surgical History:  Procedure Laterality Date  . CHOLECYSTECTOMY    . HERNIA REPAIR    . KNEE SURGERY    . VASECTOMY      Family History  Problem Relation Age of Onset  . Heart disease Mother   . Heart attack Mother   . Other Mother        thyroid problems  . Kidney cancer Mother   . Cirrhosis Father   . Other Father        thyroid problems  . Alcohol abuse Father   . Hypertension Father   . Mental illness Brother   . Parkinsonism Brother     Social History:  reports that he has been smoking cigarettes. He started smoking about 41 years ago. He has a 60.00 pack-year smoking history. He has never used smokeless tobacco. He reports current alcohol use. He reports that he does not use drugs.  Allergies:  Allergies  Allergen Reactions  . Sulfa Drugs Cross Reactors Hives  . Codeine Other (See Comments)    Made him very jittery   . Hydrochlorothiazide Other (See Comments)    Hyponatremia    REVIEW of systems:   HYPONATREMIA: His sodium is better with stopping HCTZ  It is now 134  He still taking 80 mg of Prozac Review has been told to not restrict his salt excessively  CT scan of his chest did not show any tumor  Lab Results  Component  Value Date   NA 134 (L) 12/19/2019   K 4.0 12/19/2019   CL 99 12/19/2019   CO2 30 12/19/2019      Examination:   BP (!) 162/90 (BP Location: Left Arm, Patient Position: Sitting, Cuff Size: Normal)   Pulse 70   Ht 5\' 9"  (1.753 m)   Wt 198 lb (89.8 kg)   SpO2 97%  BMI 29.24 kg/m       Assessments   Hyponatremia: Asymptomatic and mild, now nearly normal Likely to be from SSRI, he has taken large doses of Prozac for some time We will continue to monitor  HYPERTENSION:  His blood pressure is unusually high today Likely to be higher because of his delaying his changing of the clonidine patch Discussed that he can get rebound hypertension from not taking clonidine regularly, need to change the patch every 7 days and he can set a fixed day of the week to change his patch  He will come in the office to have the nurse check his blood pressure again along with comparison of his home monitor,  apparently using [employer] monitor  HYPOTHYROIDISM: Although his TSH was normal previously and is now persistently low This may be partly related to weight loss He is still taking brand-name Synthroid and has not changed the way he takes a supplement every morning  Since TSH is 0.01 will reduce his dose to 150 mcg and follow-up in 2 months    [name] 12/21/2019, 1:59 PM

## 2019-12-28 ENCOUNTER — Other Ambulatory Visit: Payer: Self-pay

## 2019-12-28 ENCOUNTER — Other Ambulatory Visit: Payer: Self-pay | Admitting: Physician Assistant

## 2019-12-28 MED ORDER — ALPRAZOLAM 1 MG PO TABS: ORAL_TABLET | ORAL | 1 refills | Status: DC

## 2019-12-28 NOTE — Telephone Encounter (Signed)
Alprazolam 1mg  tab #45 last filled 11/01/19 with 1 refill  LOV 09/26/19 No future appt scheduled.

## 2020-01-16 ENCOUNTER — Other Ambulatory Visit: Payer: Self-pay | Admitting: Physician Assistant

## 2020-02-02 ENCOUNTER — Ambulatory Visit: Payer: 59 | Admitting: Physician Assistant

## 2020-02-02 ENCOUNTER — Encounter: Payer: Self-pay | Admitting: Physician Assistant

## 2020-02-02 ENCOUNTER — Other Ambulatory Visit: Payer: Self-pay

## 2020-02-02 VITALS — BP 140/82 | HR 56 | Temp 96.7°F | Resp 16 | Ht 69.0 in | Wt 193.0 lb

## 2020-02-02 DIAGNOSIS — S80861A Insect bite (nonvenomous), right lower leg, initial encounter: Secondary | ICD-10-CM

## 2020-02-02 DIAGNOSIS — E785 Hyperlipidemia, unspecified: Secondary | ICD-10-CM

## 2020-02-02 DIAGNOSIS — W57XXXA Bitten or stung by nonvenomous insect and other nonvenomous arthropods, initial encounter: Secondary | ICD-10-CM | POA: Diagnosis not present

## 2020-02-02 MED ORDER — DOXYCYCLINE HYCLATE 100 MG PO CAPS: 100.0000 mg | ORAL_CAPSULE | Freq: Two times a day (BID) | ORAL | 0 refills | Status: DC

## 2020-02-02 MED ORDER — ALPRAZOLAM 1 MG PO TABS: ORAL_TABLET | ORAL | 1 refills | Status: DC

## 2020-02-02 MED ORDER — SIMVASTATIN 40 MG PO TABS: 40.0000 mg | ORAL_TABLET | Freq: Every day | ORAL | 1 refills | Status: DC

## 2020-02-02 NOTE — Patient Instructions (Signed)
Please keep skin clean and dry.  Avoid scratching at the area. Stop neopsporin. Can use OTC bacitracin ointment if needed. Take the Doxycycline as directed.  Let me know if symptoms are not resolving.  Remember, the bite itself can take several weeks to heal.

## 2020-02-02 NOTE — Progress Notes (Signed)
Patient presents to clinic today c/o tick bite of right lower calf, first noted 1 week ago.  States it was a small tick with white spots on back.  Was able to remove the tick completely.  Is unsure of how long the tick was attached.  Patient notes some burning and itching of the area with some noted redness at site.  Denies fever, chills, rash, malaise or fatigue.  Has been keeping the area clean and dry and applying Neosporin.  Past Medical History:  Diagnosis Date  . Allergy   . Anxiety   . Atrial fibrillation ([place])   . Bell palsy   . Depression   . Diastolic heart failure   . Hypercholesterolemia   . Hypertension   . Pneumonia   . Thyroid disease     Current Outpatient Medications on File Prior to Visit  Medication Sig Dispense Refill  . albuterol (PROVENTIL) (2.5 MG/3ML) 0.083% nebulizer solution Take 3 mLs (2.5 mg total) by nebulization every 6 (six) hours as needed for wheezing or shortness of breath. 150 mL 1  . albuterol (VENTOLIN HFA) 108 (90 Base) MCG/ACT inhaler INHALE 2 PUFFS INTO THE LUNGS EVERY 6 HOURS AS NEEDED FOR WHEEZING/SHORTNESS OF BREATH 18 g 2  . ALPRAZolam (XANAX) 1 MG tablet TAKE 1/2 TABLET BY MOUTH 3 TIMES A DAY. 45 tablet 1  . aspirin 81 MG tablet Take 81 mg by mouth every other day.     . carvedilol (COREG) 12.5 MG tablet Take 1.5 tablets (18.75 mg total) by mouth 2 (two) times daily. 270 tablet 3  . CHANTIX 1 MG tablet TAKE 1 TABLET BY MOUTH TWICE A DAY 168 tablet 1  . cloNIDine (CATAPRES - DOSED IN MG/24 HR) 0.1 mg/24hr patch PLACE 1 PATCH (0.1 MG TOTAL) ONTO THE SKIN ONCE A WEEK. 12 patch 4  . flecainide (TAMBOCOR) 100 MG tablet Take 1 tablet (100 mg total) by mouth 2 (two) times daily. 180 tablet 3  . FLUoxetine (PROZAC) 40 MG capsule TAKE 2 CAPSULES BY MOUTH EVERY DAY 180 capsule 1  . ipratropium (ATROVENT) 0.03 % nasal spray PLACE 2 SPRAYS INTO BOTH NOSTRILS 2 (TWO) TIMES DAILY. USE 2 SPRAYS INTO BOTH NOSTRIL 3 TIMES DAILY 30 mL 0  . levothyroxine  (SYNTHROID) 150 MCG tablet Take 1 tablet (150 mcg total) by mouth daily before breakfast. 90 tablet 1  . lisinopril (ZESTRIL) 40 MG tablet TAKE 1 TABLET BY MOUTH EVERY DAY 90 tablet 2  . simvastatin (ZOCOR) 40 MG tablet TAKE 1 TABLET (40 MG TOTAL) BY MOUTH DAILY. PLEASE SCHEDULE A PHYSICAL FOR REFILLS 90 tablet 1  . Suvorexant (BELSOMRA) 15 MG TABS Take 1 tablet by mouth at bedtime. 10 tablet 0  . tamsulosin (FLOMAX) 0.4 MG CAPS capsule TAKE 1 CAPSULE BY MOUTH EVERY DAY 90 capsule 1   No current facility-administered medications on file prior to visit.    Allergies  Allergen Reactions  . Sulfa Drugs Cross Reactors Hives  . Codeine Other (See Comments)    Made him very jittery   . Hydrochlorothiazide Other (See Comments)    Hyponatremia    Family History  Problem Relation Age of Onset  . Heart disease Mother   . Heart attack Mother   . Other Mother        thyroid problems  . Kidney cancer Mother   . Cirrhosis Father   . Other Father        thyroid problems  . Alcohol abuse Father   .  Hypertension Father   . Mental illness Brother   . Parkinsonism Brother     Social History   Socioeconomic History  . Marital status: Married    Spouse name: Not on file  . Number of children: 1  . Years of education: Not on file  . Highest education level: Not on file  Occupational History  . Not on file  Tobacco Use  . Smoking status: Current Every Day Smoker    Packs/day: 0.50    Years: 40.00    Pack years: 20.00    Types: Cigarettes    Start date: 09/14/1978  . Smokeless tobacco: Never Used  Substance and Sexual Activity  . Alcohol use: Yes    Alcohol/week: 0.0 standard drinks    Comment: rarely  . Drug use: No  . Sexual activity: Never    Birth control/protection: None  Other Topics Concern  . 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 Difficulty of Paying Living Expenses:   Food Insecurity:   . Worried About Charity fundraiser in the Last Year:   . Arboriculturist in the Last Year:   Transportation Needs:   . Film/video editor (Medical):   Marland Kitchen Lack of Transportation (Non-Medical):   Physical Activity:   . Days of Exercise per Week:   . Minutes of Exercise per Session:   Stress:   . Feeling of Stress :   Social Connections:   . Frequency of Communication with Friends and Family:   . Frequency of Social Gatherings with Friends and Family:   . Attends Religious Services:   . Active Member of Clubs or Organizations:   . Attends Archivist Meetings:   Marland Kitchen Marital Status:    Review of Systems - See HPI.  All other ROS are negative.  Resp 16   Ht 5\' 9"  (1.753 m)   Wt 193 lb (87.5 kg)   BMI 28.50 kg/m   Physical Exam Vitals reviewed.  Constitutional:      Appearance: Normal appearance.  HENT:     Head: Normocephalic and atraumatic.  Cardiovascular:     Rate and Rhythm: Normal rate and regular rhythm.     Heart sounds: Normal heart sounds.  Pulmonary:     Effort: Pulmonary effort is normal.  Musculoskeletal:     Cervical back: Neck supple.  Skin:      Neurological:     Mental Status: He is alert.  Psychiatric:        Mood and Affect: Mood normal.     Recent Results (from the past 2160 hour(s))  T4, free     Status: None   Collection Time: 12/19/19  9:40 AM  Result Value Ref Range   Free T4 1.31 0.60 - 1.60 ng/dL    Comment: Specimens from patients who are undergoing biotin therapy and /or ingesting biotin supplements may contain high levels of biotin.  The higher biotin concentration in these specimens  interferes with this Free T4 assay.  Specimens that contain high  levels  of biotin may cause false high results for this Free T4 assay.  Please interpret results in light of the total clinical presentation of the patient.    TSH     Status: Abnormal   Collection Time: 12/19/19  9:40 AM  Result Value Ref Range   TSH 0.01 (L) 0.35 - 4.50 uIU/mL  Basic metabolic panel     Status: Abnormal   Collection Time: 12/19/19  9:40 AM  Result Value Ref Range   Sodium 134 (L) 135 - 145 mEq/L   Potassium 4.0 3.5 - 5.1 mEq/L   Chloride 99 96 - 112 mEq/L   CO2 30 19 - 32 mEq/L   Glucose, Bld 100 (H) 70 - 99 mg/dL   BUN 8 6 - 23 mg/dL   Creatinine, Ser 0.83 0.40 - 1.50 mg/dL   GFR 94.18 >60.00 mL/min   Calcium 8.5 8.4 - 10.5 mg/dL    Assessment/Plan: 1. Dyslipidemia Patient requesting refill of medication.  Refill sent as patient up-to-date on blood work. - simvastatin (ZOCOR) 40 MG tablet; Take 1 tablet (40 mg total) by mouth daily.  Dispense: 90 tablet; Refill: 1  2. Tick bite, initial encounter Giving excoriation noted in the area along with tenderness concern for some mild developing cellulitis, especially as hygiene has not always been the greatest.  Will start Rx doxycycline twice daily x7 days.  Skin care reviewed with patient.  Follow-up if symptoms or not improving or if new symptoms develop.  This visit occurred during the SARS-CoV-2 public health emergency.  Safety protocols were in place, including screening questions prior to the visit, additional usage of staff PPE, and extensive cleaning of exam room while observing appropriate contact time as indicated for disinfecting solutions.     Leeanne Rio, PA-C

## 2020-02-17 ENCOUNTER — Other Ambulatory Visit: Payer: Self-pay | Admitting: Physician Assistant

## 2020-02-20 ENCOUNTER — Other Ambulatory Visit (INDEPENDENT_AMBULATORY_CARE_PROVIDER_SITE_OTHER): Payer: 59

## 2020-02-20 ENCOUNTER — Other Ambulatory Visit: Payer: Self-pay

## 2020-02-20 DIAGNOSIS — E890 Postprocedural hypothyroidism: Secondary | ICD-10-CM | POA: Diagnosis not present

## 2020-02-20 LAB — TSH: TSH: 0.9 u[IU]/mL (ref 0.35–4.50)

## 2020-02-20 LAB — T4, FREE: Free T4: 0.91 ng/dL (ref 0.60–1.60)

## 2020-02-22 ENCOUNTER — Ambulatory Visit: Payer: 59 | Admitting: Endocrinology

## 2020-02-25 NOTE — Progress Notes (Signed)
Patient ID: Jesse Europe [name], male   DOB: June 27, 1961, 59 y.o.   MRN: [id number]   Reason for Appointment: Follow-up    History of Present Illness:   The hypothyroidism was first diagnosed in 11/13. Previously had I-131 treatment for Graves' disease He was initially started with 88 mcg but his dose needed to be increased progressively  He has been treated with brand name SYNTHROID, the dose has been adjusted previously based on his labs  He is taking 150 mcg brand-name Synthroid now  TSH is now normal at 0.9, previously was 0.01 May have required lower doses recently because of weight loss  He thinks he felt less tired after his doses being adjusted on the last visit He thinks he is still getting the brand name Synthroid although prescription is for generic  Weight has leveled off recently Has not missed any doses of his Synthroid  Labs as follows  Wt Readings from Last 3 Encounters:  02/26/20 195 lb 6.4 oz (88.6 kg)  02/02/20 193 lb (87.5 kg)  12/21/19 198 lb (89.8 kg)    LABS:   Lab Results  Component Value Date   TSH 0.90 02/20/2020   TSH 0.01 (L) 12/19/2019   TSH 0.02 (L) 09/25/2019   FREET4 0.91 02/20/2020   FREET4 1.31 12/19/2019   FREET4 1.56 09/26/2019      HYPERTENSION:  He has had long-standing hypertension which is  managed with lisinopril 40, Catapres 0.1 mg patch and Coreg 1-1/2 tablets twice a day He was told to stop hydrochlorothiazide which was causing hyponatremia  He was evaluated for secondary hypertension and reports of his aldosterone/renin indicate normal levels Also was supposed to get the renal artery ultrasound but this was not done  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 If you have any questions, ask your nurse or doctor.        albuterol (2.5 MG/3ML) 0.083% nebulizer solution Commonly known as: PROVENTIL Take 3 mLs (2.5 mg total) by nebulization every 6 (six) hours as needed for wheezing or shortness of breath.   albuterol 108 (90 Base) MCG/ACT inhaler Commonly known as: VENTOLIN HFA INHALE 2 PUFFS INTO THE LUNGS EVERY 6 HOURS AS NEEDED FOR WHEEZING/SHORTNESS OF BREATH   ALPRAZolam 1 MG tablet Commonly known as: XANAX TAKE 1/2 TABLET BY MOUTH 3 TIMES A DAY.   aspirin 81 MG tablet Take 81 mg by mouth every other day.   Belsomra 15 MG Tabs Generic drug: Suvorexant Take 1 tablet by mouth at bedtime.   carvedilol 12.5 MG tablet Commonly known as: COREG Take 1.5 tablets (18.75 mg total) by mouth 2 (two) times daily.   Chantix 1 MG tablet Generic drug: varenicline TAKE 1 TABLET BY MOUTH TWICE A DAY   cloNIDine 0.1 mg/24hr patch Commonly known as: CATAPRES - Dosed in mg/24  hr PLACE 1 PATCH (0.1 MG TOTAL) ONTO THE SKIN ONCE A WEEK.   doxycycline 100 MG capsule Commonly known as: VIBRAMYCIN Take 1 capsule (100 mg total) by mouth 2 (two) times daily.   flecainide 100 MG tablet Commonly known as: TAMBOCOR Take 1 tablet (100 mg total) by mouth 2 (two) times daily.   FLUoxetine 40 MG capsule Commonly known as: PROZAC TAKE 2 CAPSULES BY MOUTH EVERY DAY   ipratropium 0.03 % nasal spray Commonly known as: ATROVENT PLACE 2 SPRAYS INTO BOTH NOSTRILS 2 (TWO) TIMES DAILY. USE 2 SPRAYS INTO BOTH NOSTRIL 3 TIMES DAILY   levothyroxine 150 MCG tablet Commonly known as: Synthroid Take 1 tablet (150 mcg total) by mouth daily before breakfast.   lisinopril  40 MG tablet Commonly known as: ZESTRIL TAKE 1 TABLET BY MOUTH EVERY DAY   simvastatin 40 MG tablet Commonly known as: ZOCOR Take 1 tablet (40 mg total) by mouth daily.   tamsulosin 0.4 MG Caps capsule Commonly known as: FLOMAX TAKE 1 CAPSULE BY MOUTH EVERY DAY       Past Medical History:  Diagnosis Date  . Allergy   . Anxiety   . Atrial fibrillation ([place])   . Bell palsy   . Depression   . Diastolic heart failure   . Hypercholesterolemia   . Hypertension   . Pneumonia   . Thyroid disease     Past Surgical History:  Procedure Laterality Date  . CHOLECYSTECTOMY    . HERNIA REPAIR    . KNEE SURGERY    . VASECTOMY      Family History  Problem Relation Age of Onset  . Heart disease Mother   . Heart attack Mother   . Other Mother        thyroid problems  . Kidney cancer Mother   . Cirrhosis Father   . Other Father        thyroid problems  . Alcohol abuse Father   . Hypertension Father   . Mental illness Brother   . Parkinsonism Brother     Social History:  reports that he has been smoking cigarettes. He started smoking about 41 years ago. He has a 20.00 pack-year smoking history. He has never used smokeless tobacco. He reports current alcohol use. He reports that he does not use drugs.  Allergies:  Allergies  Allergen Reactions  . Sulfa Drugs Cross Reactors Hives  . Codeine Other (See Comments)    Made him very jittery   . Hydrochlorothiazide Other (See Comments)    Hyponatremia    REVIEW of systems:   HYPONATREMIA: His sodium has been better with stopping HCTZ  It is previously 134  He still taking 80 mg of Prozac  CT scan of his chest did not show any tumor  Lab Results  Component Value Date   NA 134 (L) 12/19/2019   K 4.0 12/19/2019   CL 99 12/19/2019   CO2 30 12/19/2019   Is asking about safety and timing of taking the second Covid vaccine, previously has had Covid infection   Examination:   BP 130/80 (BP Location: Left Arm, Patient  Position: Sitting, Cuff Size: Large)   Pulse (!) 55   Ht 5\' 9"  (1.753 m)   Wt 195 lb 6.4 oz (88.6 kg)   SpO2 96%   BMI 28.86 kg/m       Assessments    HYPERTENSION:  His blood pressure is better with taking his medications consistently  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

## 2020-02-26 ENCOUNTER — Ambulatory Visit: Payer: 59 | Admitting: Endocrinology

## 2020-02-26 ENCOUNTER — Encounter: Payer: Self-pay | Admitting: Endocrinology

## 2020-02-26 VITALS — BP 130/80 | HR 55 | Ht 69.0 in | Wt 195.4 lb

## 2020-02-26 DIAGNOSIS — E890 Postprocedural hypothyroidism: Secondary | ICD-10-CM

## 2020-02-26 DIAGNOSIS — I10 Essential (primary) hypertension: Secondary | ICD-10-CM

## 2020-04-01 ENCOUNTER — Other Ambulatory Visit: Payer: Self-pay | Admitting: Physician Assistant

## 2020-04-01 DIAGNOSIS — N401 Enlarged prostate with lower urinary tract symptoms: Secondary | ICD-10-CM

## 2020-04-05 ENCOUNTER — Other Ambulatory Visit: Payer: Self-pay | Admitting: Endocrinology

## 2020-04-05 DIAGNOSIS — I10 Essential (primary) hypertension: Secondary | ICD-10-CM

## 2020-04-23 ENCOUNTER — Other Ambulatory Visit: Payer: Self-pay | Admitting: Physician Assistant

## 2020-04-30 ENCOUNTER — Other Ambulatory Visit: Payer: Self-pay | Admitting: Physician Assistant

## 2020-04-30 NOTE — Telephone Encounter (Signed)
Alprazolam LFD 02/02/20 #45 with 1 refill LOV 02/02/20 NOV none

## 2020-05-12 ENCOUNTER — Other Ambulatory Visit: Payer: Self-pay | Admitting: Internal Medicine

## 2020-05-12 ENCOUNTER — Other Ambulatory Visit: Payer: Self-pay | Admitting: Endocrinology

## 2020-05-12 DIAGNOSIS — I10 Essential (primary) hypertension: Secondary | ICD-10-CM

## 2020-05-14 ENCOUNTER — Other Ambulatory Visit: Payer: Self-pay | Admitting: Internal Medicine

## 2020-06-17 ENCOUNTER — Other Ambulatory Visit: Payer: Self-pay | Admitting: Physician Assistant

## 2020-06-17 DIAGNOSIS — N401 Enlarged prostate with lower urinary tract symptoms: Secondary | ICD-10-CM

## 2020-06-27 ENCOUNTER — Other Ambulatory Visit: Payer: Self-pay | Admitting: Physician Assistant

## 2020-06-27 ENCOUNTER — Other Ambulatory Visit: Payer: Self-pay | Admitting: Internal Medicine

## 2020-07-23 ENCOUNTER — Encounter: Payer: Self-pay | Admitting: Physician Assistant

## 2020-07-23 ENCOUNTER — Telehealth (INDEPENDENT_AMBULATORY_CARE_PROVIDER_SITE_OTHER): Payer: 59 | Admitting: Physician Assistant

## 2020-07-23 ENCOUNTER — Other Ambulatory Visit: Payer: Self-pay

## 2020-07-23 DIAGNOSIS — R109 Unspecified abdominal pain: Secondary | ICD-10-CM | POA: Diagnosis not present

## 2020-07-23 MED ORDER — PANTOPRAZOLE SODIUM 40 MG PO TBEC
40.0000 mg | DELAYED_RELEASE_TABLET | Freq: Every day | ORAL | 0 refills | Status: DC
Start: 2020-07-23 — End: 2020-07-31

## 2020-07-23 NOTE — Progress Notes (Signed)
Virtual Visit via Video   I connected with patient on 07/23/20 at  1:30 PM EST by a video enabled telemedicine application and verified that I am speaking with the correct person using two identifiers.  Location patient: Home Location provider: [facility] Office Persons participating in the virtual visit: Patient, Provider, [place] (Patina Moore)  I discussed the limitations of evaluation and management by telemedicine and the availability of in person appointments. The patient expressed understanding and agreed to proceed.  Subjective:   HPI:   Patient presents via [facility] today c/o 1 day of intermittent abdominal cramping with increased gaseousness. Notes cramping is continual and in bilateral lower abdomen. Notes increase in flatulence. Notes BM have been regular. Denies melena, hematochezia or tenesmus. Denies nausea or vomiting. Has noted some heartburn with indigestion. Denies fever, chills. Has not taken anything OTC for his symptoms.   ROS:   See pertinent positives and negatives per HPI.  Patient Active Problem List   Diagnosis Date Noted  . COPD GOLD 0/ emphysema on CT  03/15/2019  . Visit for preventive health examination 02/03/2018  . Prostate cancer screening 02/03/2018  . Colon cancer screening 02/03/2018  . Need for Tdap vaccination 02/03/2018  . Ventricular trigeminy 01/06/2017  . Bone neoplasm 02/25/2015  . Cigarette smoker 08/07/2013  . Depression with anxiety 02/20/2012  . ED (erectile dysfunction) 02/20/2012  . Hx of Bell's palsy 02/20/2012  . Amputated toe ([place]) 02/20/2012  . Paroxysmal atrial fibrillation ([place]) 01/15/2011  . Hypothyroidism following radioiodine therapy 01/15/2011  . Essential hypertension 01/15/2011  . Dyslipidemia 01/15/2011    Social History   Tobacco Use  . Smoking status: Current Every Day Smoker    Packs/day: 0.50    Years: 40.00    Pack years: 20.00    Types: Cigarettes    Start date: 09/14/1978  . Smokeless  tobacco: Never Used  Substance Use Topics  . Alcohol use: Yes    Alcohol/week: 0.0 standard drinks    Comment: rarely    Current Outpatient Medications:  .  albuterol (PROVENTIL) (2.5 MG/3ML) 0.083% nebulizer solution, Take 3 mLs (2.5 mg total) by nebulization every 6 (six) hours as needed for wheezing or shortness of breath., Disp: 150 mL, Rfl: 1 .  albuterol (VENTOLIN HFA) 108 (90 Base) MCG/ACT inhaler, INHALE 2 PUFFS INTO THE LUNGS EVERY 6 HOURS AS NEEDED FOR WHEEZING/SHORTNESS OF BREATH, Disp: 16 g, Rfl: 2 .  ALPRAZolam (XANAX) 1 MG tablet, TAKE 1/2 TABLET BY MOUTH 3 TIMES A DAY, Disp: 45 tablet, Rfl: 1 .  aspirin 81 MG tablet, Take 81 mg by mouth every other day. , Disp: , Rfl:  .  carvedilol (COREG) 12.5 MG tablet, Take 1.5 tablets (18.75 mg total) by mouth 2 (two) times daily. Please make yearly appt with Dr. Lovena Le for December for future refills. 1st attempt, Disp: 270 tablet, Rfl: 0 .  cloNIDine (CATAPRES - DOSED IN MG/24 HR) 0.1 mg/24hr patch, PLACE 1 PATCH (0.1 MG TOTAL) ONTO THE SKIN ONCE A WEEK., Disp: 12 patch, Rfl: 4 .  flecainide (TAMBOCOR) 100 MG tablet, Take 1 tablet (100 mg total) by mouth 2 (two) times daily. Please make yearly appt with Dr. Lovena Le for December for future refills. 1st attempt, Disp: 180 tablet, Rfl: 0 .  FLUoxetine (PROZAC) 40 MG capsule, TAKE 2 CAPSULES BY MOUTH EVERY DAY, Disp: 180 capsule, Rfl: 1 .  ipratropium (ATROVENT) 0.03 % nasal spray, PLACE 2 SPRAYS INTO BOTH NOSTRILS 2 (TWO) TIMES DAILY. USE 2 SPRAYS INTO  BOTH NOSTRIL 3 TIMES DAILY, Disp: 30 mL, Rfl: 0 .  lisinopril (ZESTRIL) 40 MG tablet, TAKE 1 TABLET BY MOUTH EVERY DAY, Disp: 90 tablet, Rfl: 1 .  simvastatin (ZOCOR) 40 MG tablet, Take 1 tablet (40 mg total) by mouth daily., Disp: 90 tablet, Rfl: 1 .  Suvorexant (BELSOMRA) 15 MG TABS, Take 1 tablet by mouth at bedtime., Disp: 10 tablet, Rfl: 0 .  SYNTHROID 150 MCG tablet, TAKE 1 TABLET BY MOUTH DAILY BEFORE BREAKFAST., Disp: 90 tablet, Rfl: 1 .   tamsulosin (FLOMAX) 0.4 MG CAPS capsule, TAKE 1 CAPSULE BY MOUTH EVERY DAY, Disp: 90 capsule, Rfl: 1  Allergies  Allergen Reactions  . Sulfa Drugs Cross Reactors Hives  . Codeine Other (See Comments)    Made him very jittery   . Hydrochlorothiazide Other (See Comments)    Hyponatremia    Objective:   There were no vitals taken for this visit.  Patient is well-developed, well-nourished in no acute distress.  Resting comfortably at home.  Head is normocephalic, atraumatic.  No labored breathing.  Speech is clear and coherent with logical content.  Patient is alert and oriented at baseline.   Assessment and Plan:   1. Abdominal cramping Less than 24 hours.  No fever, chills nausea or vomiting.  No diarrhea, melena, hematochezia or tenesmus.  Cramping is bilateral lower abdomen associated with heartburn and increased flatulence.  Significant meal yesterday afternoon higher and fatty foods.  Discussed bland diet.  Rx pantoprazole given concern for mild gastritis as well.  Patient to start Gas-X and Pepto-Bismol at home.  Strict in office return precautions and ER precautions reviewed with patient.Leeanne Rio, PA-C 07/23/2020

## 2020-07-23 NOTE — Progress Notes (Signed)
I have discussed the procedure for the virtual visit with the patient who has given consent to proceed with assessment and treatment.    S , CMA     

## 2020-07-26 ENCOUNTER — Ambulatory Visit: Payer: 59 | Admitting: Physician Assistant

## 2020-07-26 ENCOUNTER — Telehealth: Payer: Self-pay

## 2020-07-26 NOTE — Telephone Encounter (Signed)
Stop Advil as this likely will exacerbate his issue. Would need Tylenol for pain. As antibiotics get into his system pain should subside. He should stick to a mostly liquid, otherwise bland diet. Follow-up next week in office with me.

## 2020-07-26 NOTE — Telephone Encounter (Signed)
Advised patient to stop the Advil, take Tylenol for pain and continue the antibiotics and follow a bland diet and follow up next week. He is agreeable.

## 2020-07-26 NOTE — Telephone Encounter (Signed)
Patient is returning Cody's call in regard to how he is feeling after leaving the ED.    Patient states he is still having abdominal pain.  States he is currently taking Advil for this but it is not helping.    Please Advise.

## 2020-07-31 ENCOUNTER — Encounter: Payer: Self-pay | Admitting: Physician Assistant

## 2020-07-31 ENCOUNTER — Other Ambulatory Visit: Payer: Self-pay

## 2020-07-31 ENCOUNTER — Ambulatory Visit: Payer: 59 | Admitting: Physician Assistant

## 2020-07-31 VITALS — BP 122/86 | HR 57 | Temp 98.2°F | Resp 16 | Ht 69.0 in | Wt 205.0 lb

## 2020-07-31 DIAGNOSIS — E871 Hypo-osmolality and hyponatremia: Secondary | ICD-10-CM | POA: Diagnosis not present

## 2020-07-31 DIAGNOSIS — K5792 Diverticulitis of intestine, part unspecified, without perforation or abscess without bleeding: Secondary | ICD-10-CM | POA: Diagnosis not present

## 2020-07-31 DIAGNOSIS — H6123 Impacted cerumen, bilateral: Secondary | ICD-10-CM | POA: Diagnosis not present

## 2020-07-31 LAB — COMPREHENSIVE METABOLIC PANEL
ALT: 15 U/L (ref 0–53)
AST: 15 U/L (ref 0–37)
Albumin: 4.2 g/dL (ref 3.5–5.2)
Alkaline Phosphatase: 78 U/L (ref 39–117)
BUN: 6 mg/dL (ref 6–23)
CO2: 30 mEq/L (ref 19–32)
Calcium: 9 mg/dL (ref 8.4–10.5)
Chloride: 99 mEq/L (ref 96–112)
Creatinine, Ser: 0.78 mg/dL (ref 0.40–1.50)
GFR: 96.17 mL/min (ref >60.00)
Glucose, Bld: 87 mg/dL (ref 70–99)
Potassium: 4.2 mEq/L (ref 3.5–5.1)
Sodium: 135 mEq/L (ref 135–145)
Total Bilirubin: 0.4 mg/dL (ref 0.2–1.2)
Total Protein: 6.6 g/dL (ref 6.0–8.3)

## 2020-07-31 MED ORDER — ONDANSETRON HCL 4 MG PO TABS: 4.0000 mg | ORAL_TABLET | Freq: Three times a day (TID) | ORAL | 0 refills | Status: DC | PRN

## 2020-07-31 NOTE — Patient Instructions (Signed)
Please go to the lab today for blood work.  I will call you with your results. We will alter treatment regimen(s) if indicated by your results.   Please complete the course of antibiotic. Zofran if needed for nausea. Tylenol for mild pain.  Follow the dietary recommendations below. Let us know if things are not continuing to resolve.    Diverticulitis  Diverticulitis is when small pockets in your large intestine (colon) get infected or swollen. This causes stomach pain and watery poop (diarrhea). These pouches are called diverticula. They form in people who have a condition called diverticulosis. Follow these instructions at home: Medicines  Take over-the-counter and prescription medicines only as told by your doctor. These include: ? Antibiotics. ? Pain medicines. ? Fiber pills. ? Probiotics. ? Stool softeners.  Do not drive or use heavy machinery while taking prescription pain medicine.  If you were prescribed an antibiotic, take it as told. Do not stop taking it even if you feel better. General instructions   Follow a diet as told by your doctor.  When you feel better, your doctor may tell you to change your diet. You may need to eat a lot of fiber. Fiber makes it easier to poop (have bowel movements). Healthy foods with fiber include: ? Berries. ? Beans. ? Lentils. ? Green vegetables.  Exercise 3 or more times a week. Aim for 30 minutes each time. Exercise enough to sweat and make your heart beat faster.  Keep all follow-up visits as told. This is important. You may need to have an exam of the large intestine. This is called a colonoscopy. Contact a doctor if:  Your pain does not get better.  You have a hard time eating or drinking.  You are not pooping like normal. Get help right away if:  Your pain gets worse.  Your problems do not get better.  Your problems get worse very fast.  You have a fever.  You throw up (vomit) more than one time.  You have  poop that is: ? Bloody. ? Black. ? Tarry. Summary  Diverticulitis is when small pockets in your large intestine (colon) get infected or swollen.  Take medicines only as told by your doctor.  Follow a diet as told by your doctor. This information is not intended to replace advice given to you by your health care provider. Make sure you discuss any questions you have with your health care provider. Document Revised: 08/13/2017 Document Reviewed: 09/17/2016 Elsevier Patient Education  [employer].

## 2020-07-31 NOTE — Progress Notes (Signed)
Patient presents to clinic today for ER follow-up.  Patient presented to the ER on 07/25/2020 with worsening lower abdominal pain with stool changes.  ER visit found in care everywhere and reviewed in detail.  ER work-up included vitals which were stable, abdominal examination revealing right lower quadrant pain without rebound or guarding.  Blood count revealed mild leukocytosis without neutrophilia.  UA unremarkable.  CT abdomen and pelvis obtained revealing acute diverticulitis.  Patient will give [place] some pain medicine in the ED far.  Was discharged home on a course of Augmentin with strict ER follow-up.  Since discharge, patient endorses taking his antibiotic as directed and tolerating well.  Has noted improvement in pain and discomfort.  Notes symptoms are completely resolved but are mostly resolved.  Is keeping well hydrated.  Notes bowel movements are regular without melena, hematochezia or tenesmus.  Does note some occasional lingering nausea.  Denies fever, chills.  Patient also endorses a feeling of fullness in ears bilaterally with some decreased hearing.  Has history of cerumen impaction.  Would like assessed today and cleaned out if possible.  Past Medical History:  Diagnosis Date  . Allergy   . Anxiety   . Atrial fibrillation ([place])   . Bell palsy   . Depression   . Diastolic heart failure   . Hypercholesterolemia   . Hypertension   . Pneumonia   . Thyroid disease     Current Outpatient Medications on File Prior to Visit  Medication Sig Dispense Refill  . albuterol (PROVENTIL) (2.5 MG/3ML) 0.083% nebulizer solution Take 3 mLs (2.5 mg total) by nebulization every 6 (six) hours as needed for wheezing or shortness of breath. 150 mL 1  . albuterol (VENTOLIN HFA) 108 (90 Base) MCG/ACT inhaler INHALE 2 PUFFS INTO THE LUNGS EVERY 6 HOURS AS NEEDED FOR WHEEZING/SHORTNESS OF BREATH 16 g 2  . ALPRAZolam (XANAX) 1 MG tablet TAKE 1/2 TABLET BY MOUTH 3 TIMES A DAY 45 tablet 1  . aspirin  81 MG tablet Take 81 mg by mouth every other day.     . carvedilol (COREG) 12.5 MG tablet Take 1.5 tablets (18.75 mg total) by mouth 2 (two) times daily. Please make yearly appt with Dr. Lovena Le for December for future refills. 1st attempt 270 tablet 0  . cloNIDine (CATAPRES - DOSED IN MG/24 HR) 0.1 mg/24hr patch PLACE 1 PATCH (0.1 MG TOTAL) ONTO THE SKIN ONCE A WEEK. 12 patch 4  . flecainide (TAMBOCOR) 100 MG tablet Take 1 tablet (100 mg total) by mouth 2 (two) times daily. Please make yearly appt with Dr. Lovena Le for December for future refills. 1st attempt 180 tablet 0  . FLUoxetine (PROZAC) 40 MG capsule TAKE 2 CAPSULES BY MOUTH EVERY DAY 180 capsule 1  . ipratropium (ATROVENT) 0.03 % nasal spray PLACE 2 SPRAYS INTO BOTH NOSTRILS 2 (TWO) TIMES DAILY. USE 2 SPRAYS INTO BOTH NOSTRIL 3 TIMES DAILY 30 mL 0  . lisinopril (ZESTRIL) 40 MG tablet TAKE 1 TABLET BY MOUTH EVERY DAY 90 tablet 1  . simvastatin (ZOCOR) 40 MG tablet Take 1 tablet (40 mg total) by mouth daily. 90 tablet 1  . Suvorexant (BELSOMRA) 15 MG TABS Take 1 tablet by mouth at bedtime. 10 tablet 0  . SYNTHROID 150 MCG tablet TAKE 1 TABLET BY MOUTH DAILY BEFORE BREAKFAST. 90 tablet 1  . tamsulosin (FLOMAX) 0.4 MG CAPS capsule TAKE 1 CAPSULE BY MOUTH EVERY DAY 90 capsule 1   No current facility-administered medications on file prior to visit.  Allergies  Allergen Reactions  . Sulfa Drugs Cross Reactors Hives  . Codeine Other (See Comments)    Made him very jittery   . Hydrochlorothiazide Other (See Comments)    Hyponatremia    Family History  Problem Relation Age of Onset  . Heart disease Mother   . Heart attack Mother   . Other Mother        thyroid problems  . Kidney cancer Mother   . Cirrhosis Father   . Other Father        thyroid problems  . Alcohol abuse Father   . Hypertension Father   . Mental illness Brother   . Parkinsonism Brother     Social History   Socioeconomic History  . Marital status: Married     Spouse name: Not on file  . Number of children: 1  . Years of education: Not on file  . Highest education level: Not on file  Occupational History  . Not on file  Tobacco Use  . Smoking status: Current Every Day Smoker    Packs/day: 0.50    Years: 40.00    Pack years: 20.00    Types: Cigarettes    Start date: 09/14/1978  . Smokeless tobacco: Never Used  Vaping Use  . Vaping Use: Never used  Substance and Sexual Activity  . Alcohol use: Yes    Alcohol/week: 0.0 standard drinks    Comment: rarely  . Drug use: No  . Sexual activity: Never    Birth control/protection: None  Other Topics Concern  . 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 Difficulty of Paying Living Expenses: Not on file  Food Insecurity:   . Worried About Charity fundraiser in the Last Year: Not on file  . Ran Out of Food in the Last Year: Not on file  Transportation Needs:   . Lack of Transportation (Medical): Not on file  . Lack of Transportation (Non-Medical): Not on file  Physical Activity:   . Days of Exercise per Week: Not on file  . Minutes of Exercise per Session: Not on file  Stress:   . Feeling of Stress : Not on file  Social Connections:   . Frequency of Communication with Friends and Family: Not on file  . Frequency of Social Gatherings with Friends and Family: Not on file  . Attends Religious Services: Not on file  . Active Member of Clubs or Organizations: Not on file  . Attends Archivist Meetings: Not on file  . Marital Status: Not on file   Review of Systems - See HPI.  All other ROS are negative.  BP 122/86   Pulse (!)  57   Temp 98.2 F (36.8 C) (Temporal)   Resp 16   Ht 5\' 9"  (1.753 m)   Wt 205 lb (93 kg)   SpO2 97%   BMI 30.27 kg/m   Physical Exam Vitals reviewed.  Constitutional:  Appearance: Normal appearance.  HENT:     Head: Normocephalic and atraumatic.     Right Ear: There is impacted cerumen.     Left Ear: There is impacted cerumen.  Abdominal:     General: Bowel sounds are normal. There is no distension.     Palpations: Abdomen is soft.     Tenderness: There is abdominal tenderness (mild residual lower abdominal tenderness without rebound or guarding).  Musculoskeletal:     Cervical back: Neck supple.  Neurological:     General: No focal deficit present.     Mental Status: He is alert and oriented to person, place, and time.    Assessment/Plan: 1. Acute diverticulitis Resolving.  Patient to complete entire course of antibiotic.  Tylenol if needed for pain.  Dietary recommendations reviewed with patient.  Refill Zofran.  Follow-up if symptoms not continuing to resolve.  Repeat labs today. - Comprehensive metabolic panel  2. Hyponatremia Noted on ER labs.  Is hydrating well.  Repeat metabolic panel today to reassess. - Comprehensive metabolic panel  3. Hearing loss due to cerumen impaction, bilateral Unable to remove manually.  Discussed procedure for lavage.  Patient gave verbal consent.  Ear lavage x2 completed with resolution of impaction and improvement in hearing.  Tolerated well without any complication.  Home care discussed.  This visit occurred during the SARS-CoV-2 public health emergency.  Safety protocols were in place, including screening questions prior to the visit, additional usage of staff PPE, and extensive cleaning of exam room while observing appropriate contact time as indicated for disinfecting solutions.     Leeanne Rio, PA-C

## 2020-08-06 ENCOUNTER — Encounter: Payer: 59 | Admitting: Physician Assistant

## 2020-08-13 ENCOUNTER — Ambulatory Visit (INDEPENDENT_AMBULATORY_CARE_PROVIDER_SITE_OTHER): Payer: 59 | Admitting: Physician Assistant

## 2020-08-13 ENCOUNTER — Other Ambulatory Visit: Payer: Self-pay

## 2020-08-13 ENCOUNTER — Encounter: Payer: Self-pay | Admitting: Physician Assistant

## 2020-08-13 VITALS — BP 120/80 | HR 61 | Temp 98.1°F | Resp 14 | Ht 69.0 in | Wt 203.0 lb

## 2020-08-13 DIAGNOSIS — E785 Hyperlipidemia, unspecified: Secondary | ICD-10-CM

## 2020-08-13 DIAGNOSIS — Z0000 Encounter for general adult medical examination without abnormal findings: Secondary | ICD-10-CM

## 2020-08-13 DIAGNOSIS — I10 Essential (primary) hypertension: Secondary | ICD-10-CM | POA: Diagnosis not present

## 2020-08-13 DIAGNOSIS — Z125 Encounter for screening for malignant neoplasm of prostate: Secondary | ICD-10-CM | POA: Diagnosis not present

## 2020-08-13 MED ORDER — TAMSULOSIN HCL 0.4 MG PO CAPS
0.8000 mg | ORAL_CAPSULE | Freq: Every day | ORAL | 3 refills | Status: DC
Start: 2020-08-13 — End: 2020-10-02

## 2020-08-13 NOTE — Patient Instructions (Signed)
Please go to the lab for blood work.   Our office will call you with your results unless you have chosen to receive results via MyChart.  If your blood work is normal we will follow-up each year for physicals and as scheduled for chronic medical problems.  If anything is abnormal we will treat accordingly and get you in for a follow-up.  Please increase the Flomax to 2 tablets nightly (0.8 mg).   Preventive Care 68-61 Years Old, Male Preventive care refers to lifestyle choices and visits with your health care provider that can promote health and wellness. This includes:  A yearly physical exam. This is also called an annual well check.  Regular dental and eye exams.  Immunizations.  Screening for certain conditions.  Healthy lifestyle choices, such as eating a healthy diet, getting regular exercise, not using drugs or products that contain nicotine and tobacco, and limiting alcohol use. What can I expect for my preventive care visit? Physical exam Your health care provider will check:  Height and weight. These may be used to calculate body mass index (BMI), which is a measurement that tells if you are at a healthy weight.  Heart rate and blood pressure.  Your skin for abnormal spots. Counseling Your health care provider may ask you questions about:  Alcohol, tobacco, and drug use.  Emotional well-being.  Home and relationship well-being.  Sexual activity.  Eating habits.  Work and work Statistician. What immunizations do I need?  Influenza (flu) vaccine  This is recommended every year. Tetanus, diphtheria, and pertussis (Tdap) vaccine  You may need a Td booster every 10 years. Varicella (chickenpox) vaccine  You may need this vaccine if you have not already been vaccinated. Zoster (shingles) vaccine  You may need this after age 61. Measles, mumps, and rubella (MMR) vaccine  You may need at least one dose of MMR if you were born in 1957 or later. You may  also need a second dose. Pneumococcal conjugate (PCV13) vaccine  You may need this if you have certain conditions and were not previously vaccinated. Pneumococcal polysaccharide (PPSV23) vaccine  You may need one or two doses if you smoke cigarettes or if you have certain conditions. Meningococcal conjugate (MenACWY) vaccine  You may need this if you have certain conditions. Hepatitis A vaccine  You may need this if you have certain conditions or if you travel or work in places where you may be exposed to hepatitis A. Hepatitis B vaccine  You may need this if you have certain conditions or if you travel or work in places where you may be exposed to hepatitis B. Haemophilus influenzae type b (Hib) vaccine  You may need this if you have certain risk factors. Human papillomavirus (HPV) vaccine  If recommended by your health care provider, you may need three doses over 6 months. You may receive vaccines as individual doses or as more than one vaccine together in one shot (combination vaccines). Talk with your health care provider about the risks and benefits of combination vaccines. What tests do I need? Blood tests  Lipid and cholesterol levels. These may be checked every 5 years, or more frequently if you are over 18 years old.  Hepatitis C test.  Hepatitis B test. Screening  Lung cancer screening. You may have this screening every year starting at age 61 if you have a 30-pack-year history of smoking and currently smoke or have quit within the past 15 years.  Prostate cancer screening. Recommendations will vary  depending on your family history and other risks.  Colorectal cancer screening. All adults should have this screening starting at age 61 and continuing until age 61. Your health care provider may recommend screening at age 61 if you are at increased risk. You will have tests every 1-10 years, depending on your results and the type of screening test.  Diabetes screening.  This is done by checking your blood sugar (glucose) after you have not eaten for a while (fasting). You may have this done every 1-3 years.  Sexually transmitted disease (STD) testing. Follow these instructions at home: Eating and drinking  Eat a diet that includes fresh fruits and vegetables, whole grains, lean protein, and low-fat dairy products.  Take vitamin and mineral supplements as recommended by your health care provider.  Do not drink alcohol if your health care provider tells you not to drink.  If you drink alcohol: ? Limit how much you have to 0-2 drinks a day. ? Be aware of how much alcohol is in your drink. In the [place] one drink equals one 12 oz bottle of beer (355 mL), one 5 oz glass of wine (148 mL), or one 1 oz glass of hard liquor (44 mL). Lifestyle  Take daily care of your teeth and gums.  Stay active. Exercise for at least 30 minutes on 5 or more days each week.  Do not use any products that contain nicotine or tobacco, such as cigarettes, e-cigarettes, and chewing tobacco. If you need help quitting, ask your health care provider.  If you are sexually active, practice safe sex. Use a condom or other form of protection to prevent STIs (sexually transmitted infections).  Talk with your health care provider about taking a low-dose aspirin every day starting at age 61. What's next?  Go to your health care provider once a year for a well check visit.  Ask your health care provider how often you should have your eyes and teeth checked.  Stay up to date on all vaccines. This information is not intended to replace advice given to you by your health care provider. Make sure you discuss any questions you have with your health care provider. Document Revised: 08/25/2018 Document Reviewed: 08/25/2018 Elsevier Patient Education  2020 [employer].

## 2020-08-13 NOTE — Progress Notes (Signed)
Patient presents to clinic today for annual exam.  Patient is fasting for labs.  Health Maintenance: Immunizations -- up-to-date Colon Cancer Screening -- Endorses last in 2019 with 10-year recall. Records requested from Dr. Ulyses Amor office.   Past Medical History:  Diagnosis Date  . Allergy   . Anxiety   . Atrial fibrillation ([place])   . Bell palsy   . Depression   . Diastolic heart failure   . Hypercholesterolemia   . Hypertension   . Pneumonia   . Thyroid disease     Past Surgical History:  Procedure Laterality Date  . CHOLECYSTECTOMY    . HERNIA REPAIR    . KNEE SURGERY    . VASECTOMY      Current Outpatient Medications on File Prior to Visit  Medication Sig Dispense Refill  . albuterol (PROVENTIL) (2.5 MG/3ML) 0.083% nebulizer solution Take 3 mLs (2.5 mg total) by nebulization every 6 (six) hours as needed for wheezing or shortness of breath. 150 mL 1  . albuterol (VENTOLIN HFA) 108 (90 Base) MCG/ACT inhaler INHALE 2 PUFFS INTO THE LUNGS EVERY 6 HOURS AS NEEDED FOR WHEEZING/SHORTNESS OF BREATH 16 g 2  . ALPRAZolam (XANAX) 1 MG tablet TAKE 1/2 TABLET BY MOUTH 3 TIMES A DAY 45 tablet 1  . aspirin 81 MG tablet Take 81 mg by mouth every other day.     . carvedilol (COREG) 12.5 MG tablet Take 1.5 tablets (18.75 mg total) by mouth 2 (two) times daily. Please make yearly appt with Dr. Lovena Le for December for future refills. 1st attempt 270 tablet 0  . cloNIDine (CATAPRES - DOSED IN MG/24 HR) 0.1 mg/24hr patch PLACE 1 PATCH (0.1 MG TOTAL) ONTO THE SKIN ONCE A WEEK. 12 patch 4  . flecainide (TAMBOCOR) 100 MG tablet Take 1 tablet (100 mg total) by mouth 2 (two) times daily. Please make yearly appt with Dr. Lovena Le for December for future refills. 1st attempt 180 tablet 0  . FLUoxetine (PROZAC) 40 MG capsule TAKE 2 CAPSULES BY MOUTH EVERY DAY 180 capsule 1  . ipratropium (ATROVENT) 0.03 % nasal spray PLACE 2 SPRAYS INTO BOTH NOSTRILS 2 (TWO) TIMES DAILY. USE 2 SPRAYS INTO BOTH  NOSTRIL 3 TIMES DAILY 30 mL 0  . lisinopril (ZESTRIL) 40 MG tablet TAKE 1 TABLET BY MOUTH EVERY DAY 90 tablet 1  . ondansetron (ZOFRAN) 4 MG tablet Take 1 tablet (4 mg total) by mouth every 8 (eight) hours as needed for nausea or vomiting. 20 tablet 0  . simvastatin (ZOCOR) 40 MG tablet Take 1 tablet (40 mg total) by mouth daily. 90 tablet 1  . Suvorexant (BELSOMRA) 15 MG TABS Take 1 tablet by mouth at bedtime. 10 tablet 0  . SYNTHROID 150 MCG tablet TAKE 1 TABLET BY MOUTH DAILY BEFORE BREAKFAST. 90 tablet 1  . tamsulosin (FLOMAX) 0.4 MG CAPS capsule TAKE 1 CAPSULE BY MOUTH EVERY DAY 90 capsule 1   No current facility-administered medications on file prior to visit.    Allergies  Allergen Reactions  . Sulfa Drugs Cross Reactors Hives  . Codeine Other (See Comments)    Made him very jittery   . Hydrochlorothiazide Other (See Comments)    Hyponatremia    Family History  Problem Relation Age of Onset  . Heart disease Mother   . Heart attack Mother   . Other Mother        thyroid problems  . Kidney cancer Mother   . Cirrhosis Father   . Other Father  thyroid problems  . Alcohol abuse Father   . Hypertension Father   . Mental illness Brother   . Parkinsonism Brother     Social History   Socioeconomic History  . Marital status: Married    Spouse name: Not on file  . Number of children: 1  . Years of education: Not on file  . Highest education level: Not on file  Occupational History  . Not on file  Tobacco Use  . Smoking status: Current Every Day Smoker    Packs/day: 0.50    Years: 40.00    Pack years: 20.00    Types: Cigarettes    Start date: 09/14/1978  . Smokeless tobacco: Never Used  Vaping Use  . Vaping Use: Never used  Substance and Sexual Activity  . Alcohol use: Yes    Alcohol/week: 0.0 standard drinks    Comment: rarely  . Drug use: No  . Sexual activity: Never    Birth control/protection: None  Other Topics Concern  . 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 Difficulty of Paying Living Expenses: Not on file  Food Insecurity:   . Worried About Charity fundraiser in the Last Year: Not on file  . Ran Out of Food in the Last Year: Not on file  Transportation Needs:   . Lack of Transportation (Medical): Not on file  . Lack of Transportation (Non-Medical): Not on file  Physical Activity:   . Days of Exercise per Week: Not on file  . Minutes of Exercise per Session: Not on file  Stress:   . Feeling of Stress : Not on file  Social Connections:   . Frequency of Communication with Friends and Family: Not on file  . Frequency of Social Gatherings with Friends and Family: Not on file  . Attends Religious Services: Not on file  . Active Member of Clubs or Organizations: Not on file  . Attends Archivist Meetings: Not on file  . Marital Status: Not on file  Intimate Partner Violence:   . Fear of Current or Ex-Partner: Not on file  . Emotionally Abused: Not on file  . Physically Abused: Not on file  . Sexually Abused: Not on file    Review of Systems  Constitutional: Negative for fever and weight loss.  HENT: Negative for ear discharge, ear pain, hearing loss and tinnitus.   Eyes: Negative for blurred vision, double vision, photophobia and pain.  Respiratory: Negative for cough and shortness of breath.   Cardiovascular: Negative for chest pain and palpitations.  Gastrointestinal: Negative for abdominal pain, blood in stool, constipation, diarrhea, heartburn, melena, nausea and vomiting.  Genitourinary: Negative for dysuria, flank pain, frequency, hematuria and  urgency.  Musculoskeletal: Negative for falls.  Neurological: Negative for dizziness, loss of consciousness and headaches.  Endo/Heme/Allergies: Negative for environmental allergies.  Psychiatric/Behavioral: Negative for depression, hallucinations, substance abuse and suicidal ideas. The patient is not nervous/anxious and does not have insomnia.  BP 120/80   Pulse 61   Temp 98.1 F (36.7 C) (Temporal)   Resp 14   Ht 5\' 9"  (1.753 m)   Wt 203 lb (92.1 kg)   SpO2 98%   BMI 29.98 kg/m   Physical Exam Vitals reviewed.  Constitutional:      General: He is not in acute distress.    Appearance: He is well-developed. He is not diaphoretic.  HENT:     Head: Normocephalic and atraumatic.     Right Ear: Tympanic membrane, ear canal and external ear normal.     Left Ear: Tympanic membrane, ear canal and external ear normal.     Nose: Nose normal.     Mouth/Throat:     Pharynx: No posterior oropharyngeal erythema.  Eyes:     Conjunctiva/sclera: Conjunctivae normal.     Pupils: Pupils are equal, round, and reactive to light.  Neck:     Thyroid: No thyromegaly.  Cardiovascular:     Rate and Rhythm: Normal rate and regular rhythm.     Heart sounds: Normal heart sounds.  Pulmonary:     Effort: Pulmonary effort is normal. No respiratory distress.     Breath sounds: Normal breath sounds. No wheezing or rales.  Chest:     Chest wall: No tenderness.  Abdominal:     General: Bowel sounds are normal. There is no distension.     Palpations: Abdomen is soft. There is no mass.     Tenderness: There is no abdominal tenderness. There is no guarding or rebound.  Musculoskeletal:     Cervical back: Neck supple.  Lymphadenopathy:     Cervical: No cervical adenopathy.  Skin:    General: Skin is warm and dry.     Findings: No rash.  Neurological:     Mental Status: He is alert and oriented to person, place, and time.     Cranial Nerves: No cranial nerve deficit.     Recent Results (from  the past 2160 hour(s))  Comprehensive metabolic panel     Status: None   Collection Time: 07/31/20  9:56 AM  Result Value Ref Range   Sodium 135 135 - 145 mEq/L   Potassium 4.2 3.5 - 5.1 mEq/L   Chloride 99 96 - 112 mEq/L   CO2 30 19 - 32 mEq/L   Glucose, Bld 87 70 - 99 mg/dL   BUN 6 6 - 23 mg/dL   Creatinine, Ser 0.78 0.40 - 1.50 mg/dL   Total Bilirubin 0.4 0.2 - 1.2 mg/dL   Alkaline Phosphatase 78 39 - 117 U/L   AST 15 0 - 37 U/L   ALT 15 0 - 53 U/L   Total Protein 6.6 6.0 - 8.3 g/dL   Albumin 4.2 3.5 - 5.2 g/dL   GFR 96.17 >60.00 mL/min    Comment: Calculated using the CKD-EPI Creatinine Equation (2021)   Calcium 9.0 8.4 - 10.5 mg/dL    Assessment/Plan: 1. Visit for preventive health examination Depression screen negative. Health Maintenance reviewed. Preventive schedule discussed and handout given in AVS. Will obtain fasting labs today.  - CBC with Differential/Platelet  2. Prostate cancer screening The natural history of prostate cancer and ongoing controversy regarding screening and potential treatment outcomes of prostate cancer has been discussed with the patient. The meaning of a false positive PSA and a false negative PSA has been discussed. He indicates understanding of the limitations of this screening test and wishes to proceed with screening PSA testing.  - PSA  3. Essential hypertension BP normotensive. Asymptomatic. Continue current medication regimen.  - Comprehensive metabolic panel - Hemoglobin A1c - Lipid panel  4. Dyslipidemia Taking medications as directed. Repeat fasting lipids today.  - Comprehensive metabolic panel - Hemoglobin A1c - Lipid panel    This visit occurred during the SARS-CoV-2 public health emergency.  Safety protocols were in place, including screening questions prior to the visit, additional usage of staff PPE, and extensive cleaning of exam room while observing appropriate contact time as indicated for disinfecting solutions.     Leeanne Rio, PA-C

## 2020-08-14 ENCOUNTER — Other Ambulatory Visit: Payer: Self-pay | Admitting: Physician Assistant

## 2020-08-14 LAB — LIPID PANEL
Cholesterol: 173 mg/dL (ref 0–200)
HDL: 46 mg/dL (ref >39.00)
NonHDL: 127.38
Total CHOL/HDL Ratio: 4
Triglycerides: 208 mg/dL — ABNORMAL HIGH (ref 0.0–149.0)
VLDL: 41.6 mg/dL — ABNORMAL HIGH (ref 0.0–40.0)

## 2020-08-14 LAB — CBC WITH DIFFERENTIAL/PLATELET
Basophils Absolute: 0.1 10*3/uL (ref 0.0–0.1)
Basophils Relative: 1 % (ref 0.0–3.0)
Eosinophils Absolute: 0.3 10*3/uL (ref 0.0–0.7)
Eosinophils Relative: 2.3 % (ref 0.0–5.0)
HCT: 45.8 % (ref 39.0–52.0)
Hemoglobin: 15.6 g/dL (ref 13.0–17.0)
Lymphocytes Relative: 16.1 % (ref 12.0–46.0)
Lymphs Abs: 2 10*3/uL (ref 0.7–4.0)
MCHC: 34.1 g/dL (ref 30.0–36.0)
MCV: 91.7 fl (ref 78.0–100.0)
Monocytes Absolute: 0.7 10*3/uL (ref 0.1–1.0)
Monocytes Relative: 5.5 % (ref 3.0–12.0)
Neutro Abs: 9.1 10*3/uL — ABNORMAL HIGH (ref 1.4–7.7)
Neutrophils Relative %: 75.1 % (ref 43.0–77.0)
Platelets: 222 10*3/uL (ref 150.0–400.0)
RBC: 4.99 Mil/uL (ref 4.22–5.81)
RDW: 12.8 % (ref 11.5–15.5)
WBC: 12.1 10*3/uL — ABNORMAL HIGH (ref 4.0–10.5)

## 2020-08-14 LAB — COMPREHENSIVE METABOLIC PANEL
ALT: 16 U/L (ref 0–53)
AST: 16 U/L (ref 0–37)
Albumin: 4.4 g/dL (ref 3.5–5.2)
Alkaline Phosphatase: 80 U/L (ref 39–117)
BUN: 10 mg/dL (ref 6–23)
CO2: 31 mEq/L (ref 19–32)
Calcium: 9.7 mg/dL (ref 8.4–10.5)
Chloride: 98 mEq/L (ref 96–112)
Creatinine, Ser: 1.04 mg/dL (ref 0.40–1.50)
GFR: 77.41 mL/min (ref >60.00)
Glucose, Bld: 126 mg/dL — ABNORMAL HIGH (ref 70–99)
Potassium: 4.4 mEq/L (ref 3.5–5.1)
Sodium: 135 mEq/L (ref 135–145)
Total Bilirubin: 0.5 mg/dL (ref 0.2–1.2)
Total Protein: 7 g/dL (ref 6.0–8.3)

## 2020-08-14 LAB — HEMOGLOBIN A1C: Hgb A1c MFr Bld: 5.5 % (ref 4.6–6.5)

## 2020-08-14 LAB — LDL CHOLESTEROL, DIRECT: Direct LDL: 102 mg/dL

## 2020-08-14 LAB — PSA: PSA: 3.99 ng/mL (ref 0.10–4.00)

## 2020-08-17 ENCOUNTER — Other Ambulatory Visit: Payer: Self-pay | Admitting: Physician Assistant

## 2020-08-21 ENCOUNTER — Telehealth (INDEPENDENT_AMBULATORY_CARE_PROVIDER_SITE_OTHER): Payer: 59 | Admitting: Physician Assistant

## 2020-08-21 DIAGNOSIS — B9689 Other specified bacterial agents as the cause of diseases classified elsewhere: Secondary | ICD-10-CM | POA: Diagnosis not present

## 2020-08-21 DIAGNOSIS — J019 Acute sinusitis, unspecified: Secondary | ICD-10-CM

## 2020-08-21 MED ORDER — DOXYCYCLINE HYCLATE 100 MG PO CAPS: 100.0000 mg | ORAL_CAPSULE | Freq: Two times a day (BID) | ORAL | 0 refills | Status: DC

## 2020-08-21 NOTE — Progress Notes (Signed)
Virtual Visit via Telephone Note  I connected with Jesse FREIMARK [name] on 08/21/20 at  4:00 PM EST by telephone and verified that I am speaking with the correct person using two identifiers.  Location: Patient: Home Provider: [facility]   I discussed the limitations, risks, security and privacy concerns of performing an evaluation and management service by telephone and the availability of in person appointments. I also discussed with the patient that there may be a patient responsible charge related to this service. The patient expressed understanding and agreed to proceed.  History of Present Illness: Patient endorses week+ of sinus pressure, sinus pain, PND, scratchy throat and ear pain. Denies chest congestion or productive cough. Blowing out thick colored nasal mucous. Denies recent travel or sick contact.    Observations/Objective: No labored breathing.  Speech is clear and coherent with logical content.  Patient is alert and oriented at baseline.   Assessment and Plan: 1. Acute bacterial sinusitis Rx Doxycycline.  Increase fluids.  Rest.  Saline nasal spray.  Probiotic.  Mucinex as directed.  Humidifier in bedroom.  Call or return to clinic if symptoms are not improving.  - doxycycline (VIBRAMYCIN) 100 MG capsule; Take 1 capsule (100 mg total) by mouth 2 (two) times daily.  Dispense: 20 capsule; Refill: 0   Follow Up Instructions: I discussed the assessment and treatment plan with the patient. The patient was provided an opportunity to ask questions and all were answered. The patient agreed with the plan and demonstrated an understanding of the instructions.   The patient was advised to call back or seek an in-person evaluation if the symptoms worsen or if the condition fails to improve as anticipated.  I provided 10 minutes of non-face-to-face time during this encounter.   Leeanne Rio, PA-C

## 2020-08-22 ENCOUNTER — Other Ambulatory Visit: Payer: Self-pay | Admitting: Physician Assistant

## 2020-08-22 DIAGNOSIS — E785 Hyperlipidemia, unspecified: Secondary | ICD-10-CM

## 2020-08-26 ENCOUNTER — Other Ambulatory Visit (INDEPENDENT_AMBULATORY_CARE_PROVIDER_SITE_OTHER): Payer: 59

## 2020-08-26 ENCOUNTER — Other Ambulatory Visit: Payer: Self-pay

## 2020-08-26 DIAGNOSIS — I10 Essential (primary) hypertension: Secondary | ICD-10-CM | POA: Diagnosis not present

## 2020-08-26 DIAGNOSIS — E890 Postprocedural hypothyroidism: Secondary | ICD-10-CM | POA: Diagnosis not present

## 2020-08-26 LAB — BASIC METABOLIC PANEL
BUN: 9 mg/dL (ref 6–23)
CO2: 29 mEq/L (ref 19–32)
Calcium: 8.9 mg/dL (ref 8.4–10.5)
Chloride: 97 mEq/L (ref 96–112)
Creatinine, Ser: 0.85 mg/dL (ref 0.40–1.50)
GFR: 93.66 mL/min (ref >60.00)
Glucose, Bld: 83 mg/dL (ref 70–99)
Potassium: 3.7 mEq/L (ref 3.5–5.1)
Sodium: 133 mEq/L — ABNORMAL LOW (ref 135–145)

## 2020-08-26 LAB — TSH: TSH: 14.56 u[IU]/mL — ABNORMAL HIGH (ref 0.35–4.50)

## 2020-08-26 LAB — T4, FREE: Free T4: 0.69 ng/dL (ref 0.60–1.60)

## 2020-08-29 ENCOUNTER — Ambulatory Visit: Payer: 59 | Admitting: Endocrinology

## 2020-08-30 ENCOUNTER — Other Ambulatory Visit: Payer: Self-pay | Admitting: Endocrinology

## 2020-08-30 ENCOUNTER — Encounter: Payer: Self-pay | Admitting: Internal Medicine

## 2020-08-30 ENCOUNTER — Other Ambulatory Visit: Payer: Self-pay

## 2020-08-30 ENCOUNTER — Other Ambulatory Visit: Payer: Self-pay | Admitting: Physician Assistant

## 2020-08-30 ENCOUNTER — Ambulatory Visit: Payer: 59 | Admitting: Internal Medicine

## 2020-08-30 VITALS — BP 158/90 | HR 54 | Ht 69.0 in | Wt 207.2 lb

## 2020-08-30 DIAGNOSIS — I10 Essential (primary) hypertension: Secondary | ICD-10-CM

## 2020-08-30 DIAGNOSIS — I480 Paroxysmal atrial fibrillation: Secondary | ICD-10-CM | POA: Diagnosis not present

## 2020-08-30 DIAGNOSIS — I493 Ventricular premature depolarization: Secondary | ICD-10-CM

## 2020-08-30 NOTE — Progress Notes (Signed)
HPI [name] returns today for followup. He is a pleasant 59 yo man with a h/o PAF, remote Graves disease, HTN, and PVC's. He has been well controlled with regard to his PVC's and his atrial fib. He denies chest pain. He has not had any symptoms. No edema. His wife has been ill and he is under stress. His stepson and step grandson have both died of drug overdoses.  Allergies  Allergen Reactions  . Sulfa Drugs Cross Reactors Hives  . Codeine Other (See Comments)    Made him very jittery   . Hydrochlorothiazide Other (See Comments)    Hyponatremia     Current Outpatient Medications  Medication Sig Dispense Refill  . albuterol (PROVENTIL) (2.5 MG/3ML) 0.083% nebulizer solution Take 3 mLs (2.5 mg total) by nebulization every 6 (six) hours as needed for wheezing or shortness of breath. 150 mL 1  . albuterol (VENTOLIN HFA) 108 (90 Base) MCG/ACT inhaler INHALE 2 PUFFS INTO THE LUNGS EVERY 6 HOURS AS NEEDED FOR WHEEZING/SHORTNESS OF BREATH 18 g 2  . ALPRAZolam (XANAX) 1 MG tablet TAKE 1/2 TABLET BY MOUTH 3 TIMES A DAY 45 tablet 1  . aspirin 81 MG tablet Take 81 mg by mouth every other day.    . carvedilol (COREG) 12.5 MG tablet Take 1.5 tablets (18.75 mg total) by mouth 2 (two) times daily. Please make yearly appt with Dr. Lovena Le for December for future refills. 1st attempt 270 tablet 0  . cloNIDine (CATAPRES - DOSED IN MG/24 HR) 0.1 mg/24hr patch PLACE 1 PATCH (0.1 MG TOTAL) ONTO THE SKIN ONCE A WEEK. 12 patch 4  . flecainide (TAMBOCOR) 100 MG tablet Take 1 tablet (100 mg total) by mouth 2 (two) times daily. Please make yearly appt with Dr. Lovena Le for December for future refills. 1st attempt 180 tablet 0  . FLUoxetine (PROZAC) 40 MG capsule TAKE 2 CAPSULES BY MOUTH EVERY DAY 180 capsule 1  . ipratropium (ATROVENT) 0.03 % nasal spray PLACE 2 SPRAYS INTO BOTH NOSTRILS 2 (TWO) TIMES DAILY. USE 2 SPRAYS INTO BOTH NOSTRIL 3 TIMES DAILY 30 mL 0  . lisinopril (ZESTRIL) 40 MG tablet TAKE 1  TABLET BY MOUTH EVERY DAY 90 tablet 1  . ondansetron (ZOFRAN) 4 MG tablet Take 1 tablet (4 mg total) by mouth every 8 (eight) hours as needed for nausea or vomiting. 20 tablet 0  . simvastatin (ZOCOR) 40 MG tablet TAKE 1 TABLET BY MOUTH EVERY DAY 90 tablet 1  . SYNTHROID 150 MCG tablet TAKE 1 TABLET BY MOUTH DAILY BEFORE BREAKFAST. 90 tablet 1  . tamsulosin (FLOMAX) 0.4 MG CAPS capsule Take 2 capsules (0.8 mg total) by mouth daily. 60 capsule 3   No current facility-administered medications for this visit.     Past Medical History:  Diagnosis Date  . Allergy   . Anxiety   . Atrial fibrillation ([place])   . Bell palsy   . Depression   . Diastolic heart failure   . Hypercholesterolemia   . Hypertension   . Pneumonia   . Thyroid disease     ROS:   All systems reviewed and negative except as noted in the HPI.   Past Surgical History:  Procedure Laterality Date  . CHOLECYSTECTOMY    . HERNIA REPAIR    . KNEE SURGERY    . VASECTOMY       Family History  Problem Relation Age of Onset  . Heart disease Mother   . Heart attack Mother   .  Other Mother        thyroid problems  . Kidney cancer Mother   . Cirrhosis Father   . Other Father        thyroid problems  . Alcohol abuse Father   . Hypertension Father   . Mental illness Brother   . Parkinsonism Brother      Social History   Socioeconomic History  . Marital status: Married    Spouse name: Not on file  . Number of children: 1  . Years of education: Not on file  . Highest education level: Not on file  Occupational History  . Not on file  Tobacco Use  . Smoking status: Current Every Day Smoker    Packs/day: 0.50    Years: 40.00    Pack years: 20.00    Types: Cigarettes    Start date: 09/14/1978  . Smokeless tobacco: Never Used  Vaping Use  . Vaping Use: Never used  Substance and Sexual Activity  . Alcohol use: Yes    Alcohol/week: 0.0 standard drinks    Comment: rarely  . Drug use: No  . Sexual  activity: Never    Birth control/protection: None  Other Topics Concern  . 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      Tobacco: 1 ppd x 30 years      Alcohol: rare     Regular exercise: walking daily   Caffeine use: daily      Social Determinants of Health   Financial Resource Strain: Not on file  Food Insecurity: Not on file  Transportation Needs: Not on file  Physical Activity: Not on file  Stress: Not on file  Social Connections: Not on file  Intimate Partner Violence: Not on file     BP (!) 158/90   Pulse (!) 54   Ht 5\' 9"  (1.753 m)   Wt 207 lb 3.2 oz (94 kg)   SpO2 95%   BMI 30.60 kg/m   Physical Exam:  Well appearing NAD HEENT: Unremarkable Neck:  No JVD, no thyromegally Lymphatics:  No adenopathy Back:  No CVA tenderness Lungs:  Clear with no wheezes HEART:  Regular rate rhythm, no murmurs, no rubs, no clicks Abd:  soft, positive bowel sounds, no organomegally, no rebound, no guarding Ext:  2 plus pulses, no edema, no cyanosis, no clubbing Skin:  No rashes no nodules Neuro:  CN II through XII intact, motor grossly intact  EKG - nsr  Assess/Plan: 1. Persistent atrial fib - he has maintained NSR very nicely since his thyroid has been controlled and he has been on flecainide. No change in treatment. 2. PVC's - he is asymptomatic and thinks that his PVCs are controlled.  3. HTN - his bp is elevated today but usually better at home. 4. Dyslipidemia - he will continue simvastatin.  Carleene Overlie ,MD

## 2020-08-30 NOTE — Patient Instructions (Signed)

## 2020-09-02 NOTE — Progress Notes (Signed)
Patient ID: Jesse Europe [name], male   DOB: 1961/07/13, 59 y.o.   MRN: [id number]   Reason for Appointment: Follow-up    History of Present Illness:   The hypothyroidism was first diagnosed in 11/13. Previously had I-131 treatment for Graves' disease He was initially started with 88 mcg but his dose needed to be increased progressively  He has been treated with brand name SYNTHROID, the dose has been adjusted previously based on his labs  He is taking 150 mcg brand-name Synthroid   More recently has been complaining of having more fatigue  TSH is now 14.6 compared to 0.9  He has been continued on the generic Synthroid He tries to take this on empty stomach in the morning Has not missed any doses of his Synthroid recently and not clear why his thyroid levels are abnormal  Labs as follows  Wt Readings from Last 3 Encounters:  09/03/20 206 lb 9.6 oz (93.7 kg)  08/30/20 207 lb 3.2 oz (94 kg)  08/13/20 203 lb (92.1 kg)    LABS:   Lab Results  Component Value Date   TSH 14.56 (H) 08/26/2020   TSH 0.90 02/20/2020   TSH 0.01 (L) 12/19/2019   FREET4 0.69 08/26/2020   FREET4 0.91 02/20/2020   FREET4 1.31 12/19/2019      HYPERTENSION:  He has had long-standing hypertension which is  managed with lisinopril 40, Catapres 0.1 mg patch and Coreg 1-1/2 tablets twice a day He was told to stop hydrochlorothiazide which was causing hyponatremia  He was evaluated for secondary hypertension and reports of his aldosterone/renin indicate normal levels Also was supposed to get the renal artery ultrasound but this was not done  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 Blood pressure has been reportedly near normal, generally ranging from 120+/70-80  He thinks he has been regular with applying his clonidine patch and taking his oral medications Blood pressure was higher initially but better on repeat testing  BP Readings from Last 3 Encounters:   09/03/20 134/84  08/30/20 (!) 158/90  08/13/20 120/80       Allergies as of 09/03/2020      Reactions   Sulfa Drugs Cross Reactors Hives   Codeine Other (See Comments)   Made him very jittery    Hydrochlorothiazide Other (See Comments)   Hyponatremia      Medication List       Accurate as of September 03, 2020  1:02 PM. If you have any questions, ask your nurse or doctor.        STOP taking these medications   Synthroid 150 MCG tablet Generic drug: levothyroxine Replaced by: Synthroid 175 MCG tablet Stopped by: [name], MD     TAKE these medications   albuterol (2.5 MG/3ML) 0.083% nebulizer solution Commonly known as: PROVENTIL Take 3 mLs (2.5 mg total) by nebulization every 6 (six) hours as needed for wheezing or shortness of breath.   albuterol 108 (90 Base) MCG/ACT inhaler Commonly known as: VENTOLIN HFA INHALE 2 PUFFS INTO THE LUNGS EVERY 6 HOURS AS NEEDED FOR WHEEZING/SHORTNESS OF BREATH   ALPRAZolam 1 MG tablet Commonly known as: XANAX TAKE 1/2 TABLET BY MOUTH 3 TIMES A DAY   aspirin 81 MG tablet Take 81 mg by mouth every other day.   carvedilol 12.5 MG tablet Commonly known as: COREG Take 1.5 tablets (18.75 mg total) by mouth 2 (two) times daily. Please make yearly appt with Dr. Lovena Le for December  for future refills. 1st attempt   cloNIDine 0.1 mg/24hr patch Commonly known as: CATAPRES - Dosed in mg/24 hr PLACE 1 PATCH (0.1 MG TOTAL) ONTO THE SKIN ONCE A WEEK.   flecainide 100 MG tablet Commonly known as: TAMBOCOR Take 1 tablet (100 mg total) by mouth 2 (two) times daily. Please make yearly appt with Dr. Lovena Le for December for future refills. 1st attempt   FLUoxetine 40 MG capsule Commonly known as: PROZAC TAKE 2 CAPSULES BY MOUTH EVERY DAY   ipratropium 0.03 % nasal spray Commonly known as: ATROVENT PLACE 2 SPRAYS INTO BOTH NOSTRILS 2 (TWO) TIMES DAILY. USE 2 SPRAYS INTO BOTH NOSTRIL 3 TIMES DAILY   lisinopril 40 MG tablet Commonly  known as: ZESTRIL TAKE 1 TABLET BY MOUTH EVERY DAY   ondansetron 4 MG tablet Commonly known as: ZOFRAN Take 1 tablet (4 mg total) by mouth every 8 (eight) hours as needed for nausea or vomiting.   simvastatin 40 MG tablet Commonly known as: ZOCOR TAKE 1 TABLET BY MOUTH EVERY DAY   Synthroid 175 MCG tablet Generic drug: levothyroxine Take 1 tablet (175 mcg total) by mouth daily before breakfast. Replaces: Synthroid 150 MCG tablet Started by: [name], MD   tamsulosin 0.4 MG Caps capsule Commonly known as: FLOMAX Take 2 capsules (0.8 mg total) by mouth daily.       Past Medical History:  Diagnosis Date  . Allergy   . Anxiety   . Atrial fibrillation ([place])   . Bell palsy   . Depression   . Diastolic heart failure   . Hypercholesterolemia   . Hypertension   . Pneumonia   . Thyroid disease     Past Surgical History:  Procedure Laterality Date  . CHOLECYSTECTOMY    . HERNIA REPAIR    . KNEE SURGERY    . VASECTOMY      Family History  Problem Relation Age of Onset  . Heart disease Mother   . Heart attack Mother   . Other Mother        thyroid problems  . Kidney cancer Mother   . Cirrhosis Father   . Other Father        thyroid problems  . Alcohol abuse Father   . Hypertension Father   . Mental illness Brother   . Parkinsonism Brother     Social History:  reports that he has been smoking cigarettes. He started smoking about 42 years ago. He has a 20.00 pack-year smoking history. He has never used smokeless tobacco. He reports current alcohol use. He reports that he does not use drugs.  Allergies:  Allergies  Allergen Reactions  . Sulfa Drugs Cross Reactors Hives  . Codeine Other (See Comments)    Made him very jittery   . Hydrochlorothiazide Other (See Comments)    Hyponatremia    REVIEW of systems:   HYPONATREMIA: His sodium has been better with stopping HCTZ  However it tends to be low normal and now 133 He thinks he drinks a lot of [facility]  He still taking 80 mg of Prozac  CT scan of his chest did not show any tumor  Lab Results  Component Value Date   NA 133 (L) 08/26/2020   K 3.7 08/26/2020   CL 97 08/26/2020   CO2 29 08/26/2020   Is asking about safety and timing of taking the second Covid vaccine, previously has had Covid infection   Examination:   BP 134/84   Pulse (!) 54  Ht 5\' 9"  (1.753 m)   Wt 206 lb 9.6 oz (93.7 kg)   SpO2 96%   BMI 30.51 kg/m       Assessments    HYPERTENSION:   His blood pressure today is slightly higher but better when checked on the second attempt He 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

## 2020-09-02 NOTE — Telephone Encounter (Signed)
[facility]FD 07/01/20 #45 with 1 refill LOV 08/21/20 NOV none

## 2020-09-03 ENCOUNTER — Other Ambulatory Visit: Payer: Self-pay | Admitting: Physician Assistant

## 2020-09-03 ENCOUNTER — Other Ambulatory Visit: Payer: Self-pay

## 2020-09-03 ENCOUNTER — Ambulatory Visit: Payer: 59 | Admitting: Endocrinology

## 2020-09-03 VITALS — BP 134/84 | HR 54 | Ht 69.0 in | Wt 206.6 lb

## 2020-09-03 DIAGNOSIS — E871 Hypo-osmolality and hyponatremia: Secondary | ICD-10-CM

## 2020-09-03 DIAGNOSIS — E890 Postprocedural hypothyroidism: Secondary | ICD-10-CM

## 2020-09-03 DIAGNOSIS — I10 Essential (primary) hypertension: Secondary | ICD-10-CM | POA: Diagnosis not present

## 2020-09-03 MED ORDER — SYNTHROID 175 MCG PO TABS: 175.0000 ug | ORAL_TABLET | Freq: Every day | ORAL | 2 refills | Status: DC

## 2020-09-17 ENCOUNTER — Other Ambulatory Visit: Payer: Self-pay | Admitting: Internal Medicine

## 2020-10-01 ENCOUNTER — Other Ambulatory Visit: Payer: Self-pay | Admitting: Endocrinology

## 2020-10-01 DIAGNOSIS — I10 Essential (primary) hypertension: Secondary | ICD-10-CM

## 2020-10-02 ENCOUNTER — Other Ambulatory Visit: Payer: Self-pay | Admitting: Internal Medicine

## 2020-10-02 ENCOUNTER — Other Ambulatory Visit: Payer: Self-pay | Admitting: Physician Assistant

## 2020-10-02 DIAGNOSIS — I10 Essential (primary) hypertension: Secondary | ICD-10-CM

## 2020-10-17 ENCOUNTER — Other Ambulatory Visit: Payer: Self-pay | Admitting: Physician Assistant

## 2020-10-24 ENCOUNTER — Other Ambulatory Visit: Payer: Self-pay | Admitting: Physician Assistant

## 2020-10-25 NOTE — Telephone Encounter (Signed)
Xanax last rx 09/02/20 #45 1 RF LOV: 08/13/20 CPE CSC: 12/14/17

## 2020-11-06 ENCOUNTER — Other Ambulatory Visit (INDEPENDENT_AMBULATORY_CARE_PROVIDER_SITE_OTHER): Payer: 59

## 2020-11-06 ENCOUNTER — Other Ambulatory Visit: Payer: Self-pay

## 2020-11-06 DIAGNOSIS — E871 Hypo-osmolality and hyponatremia: Secondary | ICD-10-CM | POA: Diagnosis not present

## 2020-11-06 DIAGNOSIS — E890 Postprocedural hypothyroidism: Secondary | ICD-10-CM | POA: Diagnosis not present

## 2020-11-06 LAB — BASIC METABOLIC PANEL
BUN: 10 mg/dL (ref 6–23)
CO2: 30 mEq/L (ref 19–32)
Calcium: 8.8 mg/dL (ref 8.4–10.5)
Chloride: 93 mEq/L — ABNORMAL LOW (ref 96–112)
Creatinine, Ser: 0.92 mg/dL (ref 0.40–1.50)
GFR: 89.53 mL/min (ref >60.00)
Glucose, Bld: 91 mg/dL (ref 70–99)
Potassium: 3.8 mEq/L (ref 3.5–5.1)
Sodium: 130 mEq/L — ABNORMAL LOW (ref 135–145)

## 2020-11-06 LAB — TSH: TSH: 0.78 u[IU]/mL (ref 0.35–4.50)

## 2020-11-06 LAB — T4, FREE: Free T4: 1.3 ng/dL (ref 0.60–1.60)

## 2020-11-07 ENCOUNTER — Other Ambulatory Visit: Payer: Self-pay

## 2020-11-07 ENCOUNTER — Encounter (HOSPITAL_COMMUNITY): Payer: Self-pay | Admitting: Emergency Medicine

## 2020-11-07 ENCOUNTER — Emergency Department (HOSPITAL_COMMUNITY)
Admission: EM | Admit: 2020-11-07 | Discharge: 2020-11-07 | Disposition: A | Discharge status: Home / Self Care | Payer: 59 | Attending: Emergency Medicine | Admitting: Emergency Medicine

## 2020-11-07 DIAGNOSIS — I110 Hypertensive heart disease with heart failure: Secondary | ICD-10-CM | POA: Diagnosis not present

## 2020-11-07 DIAGNOSIS — W102XXA Fall (on)(from) incline, initial encounter: Secondary | ICD-10-CM | POA: Diagnosis not present

## 2020-11-07 DIAGNOSIS — Z79899 Other long term (current) drug therapy: Secondary | ICD-10-CM | POA: Diagnosis not present

## 2020-11-07 DIAGNOSIS — J449 Chronic obstructive pulmonary disease, unspecified: Secondary | ICD-10-CM | POA: Diagnosis not present

## 2020-11-07 DIAGNOSIS — Z7982 Long term (current) use of aspirin: Secondary | ICD-10-CM | POA: Insufficient documentation

## 2020-11-07 DIAGNOSIS — F17210 Nicotine dependence, cigarettes, uncomplicated: Secondary | ICD-10-CM | POA: Insufficient documentation

## 2020-11-07 DIAGNOSIS — I5030 Unspecified diastolic (congestive) heart failure: Secondary | ICD-10-CM | POA: Insufficient documentation

## 2020-11-07 DIAGNOSIS — W19XXXA Unspecified fall, initial encounter: Secondary | ICD-10-CM

## 2020-11-07 DIAGNOSIS — S0990XA Unspecified injury of head, initial encounter: Secondary | ICD-10-CM | POA: Diagnosis present

## 2020-11-07 DIAGNOSIS — Y92009 Unspecified place in unspecified non-institutional (private) residence as the place of occurrence of the external cause: Secondary | ICD-10-CM | POA: Diagnosis not present

## 2020-11-07 NOTE — ED Triage Notes (Signed)
Pt c/o a fall on a ramp outside his home due to the rain and slick shoes. Pt did hit the posterior of his his head, but denies LOC or dizziness. Pt takes a daily baby aspirin.

## 2020-11-07 NOTE — Discharge Instructions (Signed)
Use Tylenol or ibuprofen as needed for pain. Use ice to help with pain and swelling. Return to the emergency room if you develop severe worsening pain, vision changes, slurred speech, vomiting, inability to walk a straight line, any new, worsening, or concerning symptoms.

## 2020-11-07 NOTE — ED Provider Notes (Signed)
[facility] Provider Note   CSN: [id number] Arrival date & time: 11/07/20  1428     History Chief Complaint  Patient presents with  . Fall    Jesse Higinbotham Kochel [name] is a 60 y.o. male presenting for evaluation after a fall.  Patient states this prior to arrival he was going down a ramp that was slippery terrain and he slipped, falling backwards and hitting his head.  He did not lose consciousness.  He reports no pain currently, including his head, neck, or back.  No vision changes, slurred speech, dizziness, lightheadedness, numbness, or tingling.  No nausea or vomiting.  He is not on blood thinners.  He has not taken anything for pain.  HPI     Past Medical History:  Diagnosis Date  . Allergy   . Anxiety   . Atrial fibrillation ([place])   . Bell palsy   . Depression   . Diastolic heart failure   . Hypercholesterolemia   . Hypertension   . Pneumonia   . Thyroid disease     Patient Active Problem List   Diagnosis Date Noted  . COPD GOLD 0/ emphysema on CT  03/15/2019  . Visit for preventive health examination 02/03/2018  . Prostate cancer screening 02/03/2018  . Colon cancer screening 02/03/2018  . Need for Tdap vaccination 02/03/2018  . PVC's (premature ventricular contractions) 01/06/2017  . Bone neoplasm 02/25/2015  . Cigarette smoker 08/07/2013  . Depression with anxiety 02/20/2012  . ED (erectile dysfunction) 02/20/2012  . Hx of Bell's palsy 02/20/2012  . Amputated toe ([place]) 02/20/2012  . Paroxysmal atrial fibrillation ([place]) 01/15/2011  . Hypothyroidism following radioiodine therapy 01/15/2011  . Essential hypertension 01/15/2011  . Dyslipidemia 01/15/2011    Past Surgical History:  Procedure Laterality Date  . CHOLECYSTECTOMY    . HERNIA REPAIR    . KNEE SURGERY    . VASECTOMY         Family History  Problem Relation Age of Onset  . Heart disease Mother   . Heart attack Mother   . Other Mother        thyroid problems  .  Kidney cancer Mother   . Cirrhosis Father   . Other Father        thyroid problems  . Alcohol abuse Father   . Hypertension Father   . Mental illness Brother   . Parkinsonism Brother     Social History   Tobacco Use  . Smoking status: Current Every Day Smoker    Packs/day: 0.50    Years: 40.00    Pack years: 20.00    Types: Cigarettes    Start date: 09/14/1978  . Smokeless tobacco: Never Used  Vaping Use  . Vaping Use: Never used  Substance Use Topics  . Alcohol use: Yes    Alcohol/week: 0.0 standard drinks    Comment: rarely  . Drug use: No    Home Medications Prior to Admission medications   Medication Sig Start Date End Date Taking? Authorizing Provider  albuterol (PROVENTIL) (2.5 MG/3ML) 0.083% nebulizer solution Take 3 mLs (2.5 mg total) by nebulization every 6 (six) hours as needed for wheezing or shortness of breath. 08/17/18   Brunetta Jeans, PA-C  albuterol (VENTOLIN HFA) 108 (90 Base) MCG/ACT inhaler INHALE 2 PUFFS INTO THE LUNGS EVERY 6 HOURS AS NEEDED FOR WHEEZING/SHORTNESS OF BREATH 08/19/20   Brunetta Jeans, PA-C  ALPRAZolam [name] 1 MG tablet TAKE 1/2 TABLET BY MOUTH 3 TIMES A DAY  10/25/20   Dutch Quint B, FNP  aspirin 81 MG tablet Take 81 mg by mouth every other day.    [provider]  carvedilol (COREG) 12.5 MG tablet Take 1.5 tablets (18.75 mg total) by mouth 2 (two) times daily with a meal. 10/02/20   Evans Lance, MD  cloNIDine (CATAPRES - DOSED IN MG/24 HR) 0.1 mg/24hr patch PLACE 1 PATCH (0.1 MG TOTAL) ONTO THE SKIN ONCE A WEEK. 09/02/20   [name], MD  flecainide (TAMBOCOR) 100 MG tablet TAKE 1 TABLET BY MOUTH TWICE A DAY 09/18/20   Evans Lance, MD  FLUoxetine (PROZAC) 40 MG capsule TAKE 2 CAPSULES BY MOUTH EVERY DAY 10/18/20   Midge Minium, MD  ipratropium (ATROVENT) 0.03 % nasal spray PLACE 2 SPRAYS INTO BOTH NOSTRILS 2 (TWO) TIMES DAILY. USE 2 SPRAYS INTO BOTH NOSTRIL 3 TIMES DAILY 10/17/19   Brunetta Jeans, PA-C   lisinopril (ZESTRIL) 40 MG tablet TAKE 1 TABLET BY MOUTH EVERY DAY 10/01/20   [name], MD  ondansetron (ZOFRAN) 4 MG tablet Take 1 tablet (4 mg total) by mouth every 8 (eight) hours as needed for nausea or vomiting. 07/31/20   Brunetta Jeans, PA-C  simvastatin (ZOCOR) 40 MG tablet TAKE 1 TABLET BY MOUTH EVERY DAY 08/22/20   Brunetta Jeans, PA-C  SYNTHROID 175 MCG tablet Take 1 tablet (175 mcg total) by mouth daily before breakfast. 09/03/20   [name], MD  tamsulosin (FLOMAX) 0.4 MG CAPS capsule TAKE 2 CAPSULES BY MOUTH EVERY DAY 10/02/20   Brunetta Jeans, PA-C    Allergies    Sulfa drugs cross reactors, Codeine, and Hydrochlorothiazide  Review of Systems   Review of Systems  All other systems reviewed and are negative.   Physical Exam Updated Vital Signs BP (!) 154/107 (BP Location: Right Arm)   Pulse 67   Temp 98.1 F (36.7 C) (Oral)   Resp 18   Ht 5\' 9"  (1.753 m)   Wt 94.1 kg   SpO2 98%   BMI 30.64 kg/m   Physical Exam Vitals and nursing note reviewed.  Constitutional:      General: He is not in acute distress.    Appearance: He is well-developed and well-nourished.     Comments: Resting in the bed in no acute distress  HENT:     Head: Normocephalic and atraumatic.     Comments: No signs of head trauma.  No contusions or hematomas.  No laceration.  No tenderness palpation of the skull. Eyes:     Extraocular Movements: Extraocular movements intact and EOM normal.     Conjunctiva/sclera: Conjunctivae normal.     Pupils: Pupils are equal, round, and reactive to light.  Cardiovascular:     Rate and Rhythm: Normal rate and regular rhythm.     Pulses: Normal pulses and intact distal pulses.  Pulmonary:     Effort: Pulmonary effort is normal. No respiratory distress.     Breath sounds: Normal breath sounds. No wheezing.  Abdominal:     General: There is no distension.     Palpations: Abdomen is soft. There is no mass.     Tenderness: There is no abdominal  tenderness. There is no guarding or rebound.  Musculoskeletal:        General: Normal range of motion.     Cervical back: Normal range of motion and neck supple.     Comments: Strength and sensation intact x4  Skin:    General: Skin  is warm and dry.     Capillary Refill: Capillary refill takes less than 2 seconds.  Neurological:     Mental Status: He is alert and oriented to person, place, and time.     GCS: GCS eye subscore is 4. GCS verbal subscore is 5. GCS motor subscore is 6.     Cranial Nerves: Cranial nerves are intact.     Sensory: Sensation is intact.     Motor: Motor function is intact.     Comments: No gross neurologic deficits.  CN intact.  Strength and sensation intact x4.  Psychiatric:        Mood and Affect: Mood and affect normal.     ED Results / Procedures / Treatments   Labs (all labs ordered are listed, but only abnormal results are displayed) Labs Reviewed - No data to display  EKG None  Radiology No results found.  Procedures Procedures   Medications Ordered in ED Medications - No data to display  ED Course  I have reviewed the triage vital signs and the nursing notes.  Pertinent labs & imaging results that were available during my care of the patient were reviewed by me and considered in my medical decision making (see chart for details).    MDM Rules/Calculators/A&P                          Patient presenting for evaluation of head injury after fall.  On exam, patient appears nontoxic.  He has no head pain currently, no current symptoms from the fall.  No visible signs of trauma.  Neuro exam is overall reassuring.  Discussed with patient that I have low suspicion for intracranial injury, bleed, or swelling, especially in the setting of no symptoms.  However due to his age, offered head CT, patient declined.  I discussed Intermatic management with Tylenol, ibuprofen, ice.  Close monitoring of symptoms, proper return with any worsening.  At this  time, patient appears safe for discharge.  Return precautions given.  Patient states he understands and agrees to plan.  Final Clinical Impression(s) / ED Diagnoses Final diagnoses:  Fall, initial encounter  Injury of head, initial encounter    Rx / DC Orders ED Discharge Orders    None       Franchot Heidelberg, PA-C 11/07/20 1511    Noemi Chapel, MD 11/09/20 [phone]

## 2020-11-12 ENCOUNTER — Encounter: Payer: Self-pay | Admitting: Endocrinology

## 2020-11-12 ENCOUNTER — Other Ambulatory Visit: Payer: Self-pay

## 2020-11-12 ENCOUNTER — Ambulatory Visit: Payer: 59 | Admitting: Endocrinology

## 2020-11-12 VITALS — BP 128/84 | HR 64 | Ht 69.0 in | Wt 203.0 lb

## 2020-11-12 DIAGNOSIS — E890 Postprocedural hypothyroidism: Secondary | ICD-10-CM

## 2020-11-12 DIAGNOSIS — E871 Hypo-osmolality and hyponatremia: Secondary | ICD-10-CM | POA: Diagnosis not present

## 2020-11-12 DIAGNOSIS — I10 Essential (primary) hypertension: Secondary | ICD-10-CM

## 2020-11-12 NOTE — Progress Notes (Signed)
Patient ID: Jesse Europe [name], male   DOB: 08/05/1961, 60 y.o.   MRN: [id number]   Reason for Appointment: Follow-up    History of Present Illness:   The hypothyroidism was first diagnosed in 11/13. Previously had I-131 treatment for Graves' disease He was initially started with 88 mcg but his dose needed to be increased progressively  He has been treated with brand name SYNTHROID, the dose has been adjusted previously based on his labs  He is taking 175 mcg brand-name Synthroid   His dose was increased in 12/21 and at that time he was having more fatigue which is improved  TSH is now 0.8 compared to 14.6   He has been continued on the brand name Synthroid which he does not have a problem with getting coverage from his insurance  He tries to take this on empty stomach in the morning Has not missed any doses of his Synthroid regimen  Wt Readings from Last 3 Encounters:  11/12/20 203 lb (92.1 kg)  11/07/20 207 lb 8 oz (94.1 kg)  09/03/20 206 lb 9.6 oz (93.7 kg)    LABS:   Lab Results  Component Value Date   TSH 0.78 11/06/2020   TSH 14.56 (H) 08/26/2020   TSH 0.90 02/20/2020   FREET4 1.30 11/06/2020   FREET4 0.69 08/26/2020   FREET4 0.91 02/20/2020      HYPERTENSION:  He has had long-standing hypertension which is  managed with lisinopril 40, Catapres 0.1 mg patch and Coreg 1-1/2 tablets twice a day He was told to stop hydrochlorothiazide which was causing hyponatremia  He was evaluated for secondary hypertension and reports of his aldosterone/renin indicate normal levels Also was supposed to get the renal artery ultrasound but this was not done  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 If you have any questions, ask your nurse or doctor.        albuterol (2.5 MG/3ML) 0.083% nebulizer solution Commonly known as: PROVENTIL Take 3 mLs (2.5 mg total) by nebulization every 6 (six) hours as needed for wheezing or shortness of breath.   albuterol 108 (90 Base) MCG/ACT inhaler Commonly known as: VENTOLIN HFA INHALE 2 PUFFS INTO THE LUNGS EVERY 6 HOURS AS NEEDED FOR WHEEZING/SHORTNESS OF BREATH   ALPRAZolam 1 MG tablet Commonly known as: XANAX TAKE 1/2 TABLET BY MOUTH 3 TIMES A DAY   aspirin 81 MG tablet Take 81 mg by mouth every other day.   carvedilol 12.5 MG tablet Commonly known as: COREG Take 1.5 tablets (18.75 mg total) by mouth 2 (two) times daily with a meal.   cloNIDine 0.1 mg/24hr patch Commonly known as: CATAPRES - Dosed in mg/24 hr PLACE 1 PATCH (0.1 MG TOTAL) ONTO THE SKIN ONCE A WEEK.   flecainide 100 MG tablet Commonly known as: TAMBOCOR TAKE 1 TABLET BY MOUTH TWICE A DAY   FLUoxetine 40 MG  capsule Commonly known as: PROZAC TAKE 2 CAPSULES BY MOUTH EVERY DAY   ipratropium 0.03 % nasal spray Commonly known as: ATROVENT PLACE 2 SPRAYS INTO BOTH NOSTRILS 2 (TWO) TIMES DAILY. USE 2 SPRAYS INTO BOTH NOSTRIL 3 TIMES DAILY   lisinopril 40 MG tablet Commonly known as: ZESTRIL TAKE 1 TABLET BY MOUTH EVERY DAY   ondansetron 4 MG tablet Commonly known as: ZOFRAN Take 1 tablet (4 mg total) by mouth every 8 (eight) hours as needed for nausea or vomiting.   simvastatin 40 MG tablet Commonly known as: ZOCOR TAKE 1 TABLET BY MOUTH EVERY DAY   Synthroid 175 MCG tablet Generic drug: levothyroxine Take 1 tablet (175 mcg total) by mouth daily before breakfast.   tamsulosin 0.4 MG Caps capsule Commonly known as: FLOMAX TAKE  2 CAPSULES BY MOUTH EVERY DAY       Past Medical History:  Diagnosis Date  . Allergy   . Anxiety   . Atrial fibrillation ([place])   . Bell palsy   . Depression   . Diastolic heart failure   . Hypercholesterolemia   . Hypertension   . Pneumonia   . Thyroid disease     Past Surgical History:  Procedure Laterality Date  . CHOLECYSTECTOMY    . HERNIA REPAIR    . KNEE SURGERY    . VASECTOMY      Family History  Problem Relation Age of Onset  . Heart disease Mother   . Heart attack Mother   . Other Mother        thyroid problems  . Kidney cancer Mother   . Cirrhosis Father   . Other Father        thyroid problems  . Alcohol abuse Father   . Hypertension Father   . Mental illness Brother   . Parkinsonism Brother     Social History:  reports that he has been smoking cigarettes. He started smoking about 42 years ago. He has a 20.00 pack-year smoking history. He has never used smokeless tobacco. He reports current alcohol use. He reports that he does not use drugs.  Allergies:  Allergies  Allergen Reactions  . Sulfa Drugs Cross Reactors Hives  . Codeine Other (See Comments)    Made him very jittery   . Hydrochlorothiazide Other (See Comments)    Hyponatremia    REVIEW of systems:   HYPONATREMIA: His sodium had been better with stopping HCTZ  However it tends to be below normal and now has been lower 130  He was told to cut back on [facility] but he still drinks a lot of this and has not cut back on his overall fluid intake Did not understand the need for fluid restriction with his likely SIADH  He still taking 80 mg of Prozac from his PCP  CT scan of his chest did not show any tumor  Lab Results  Component Value Date   NA 130 (L) 11/06/2020   K 3.8 11/06/2020   CL 93 (L) 11/06/2020   CO2 30 11/06/2020      Examination:   BP 128/84   Pulse 64   Ht 5\' 9"  (1.753 m)   Wt 203 lb (92.1 kg)   SpO2 95%   BMI 29.98 kg/m       Assessments     HYPERTENSION:   His blood pressure today is high normal diastolic but 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

## 2020-11-12 NOTE — Patient Instructions (Signed)
Cut liquids 50%

## 2021-02-24 ENCOUNTER — Other Ambulatory Visit: Payer: Self-pay

## 2021-02-24 ENCOUNTER — Encounter: Payer: Self-pay | Admitting: Registered Nurse

## 2021-02-24 ENCOUNTER — Ambulatory Visit: Payer: 59 | Admitting: Registered Nurse

## 2021-02-24 VITALS — BP 125/62 | HR 78 | Temp 98.1°F | Resp 17 | Ht 69.0 in | Wt 205.8 lb

## 2021-02-24 DIAGNOSIS — S1086XA Insect bite of other specified part of neck, initial encounter: Secondary | ICD-10-CM

## 2021-02-24 DIAGNOSIS — H938X3 Other specified disorders of ear, bilateral: Secondary | ICD-10-CM

## 2021-02-24 DIAGNOSIS — J449 Chronic obstructive pulmonary disease, unspecified: Secondary | ICD-10-CM

## 2021-02-24 DIAGNOSIS — W57XXXA Bitten or stung by nonvenomous insect and other nonvenomous arthropods, initial encounter: Secondary | ICD-10-CM

## 2021-02-24 DIAGNOSIS — F5102 Adjustment insomnia: Secondary | ICD-10-CM | POA: Diagnosis not present

## 2021-02-24 MED ORDER — ALPRAZOLAM 1 MG PO TABS: 0.5000 mg | ORAL_TABLET | Freq: Every evening | ORAL | 0 refills | Status: DC | PRN

## 2021-02-24 MED ORDER — HYDROCORTISONE-ACETIC ACID 1-2 % OT SOLN: 3.0000 [drp] | Freq: Two times a day (BID) | OTIC | 0 refills | Status: DC

## 2021-02-24 MED ORDER — ALBUTEROL SULFATE HFA 108 (90 BASE) MCG/ACT IN AERS: INHALATION_SPRAY | RESPIRATORY_TRACT | 2 refills | Status: DC

## 2021-02-24 MED ORDER — DOXYCYCLINE HYCLATE 100 MG PO TABS: 100.0000 mg | ORAL_TABLET | Freq: Two times a day (BID) | ORAL | 0 refills | Status: DC

## 2021-02-24 NOTE — Patient Instructions (Addendum)
Mr. Want -   Good to meet you. In brief:  Tick bite Looked like a lone star tick. Higher risk for [facility] Spotted Fever. Will treat prophylactic with doxycycline 100mg  by mouth twice daily for ten days. Please finish the whole course.   Ears Disimpacted by Jerene Pitch today. Some residual inflammation - I have sent drops for this. Let me know if things get worse or fail to improve  Med refill I have sent alprazolam and albuterol It is important to establish with a new primary care to continue getting these refilled  Thank you  Rich     If you have lab work done today you will be contacted with your lab results within the next 2 weeks.  If you have not heard from [place] then please contact us. The fastest way to get your results is to register for My Chart.   IF you received an x-ray today, you will receive an invoice from [facility]. Please contact [facility] at [phone] with questions or concerns regarding your invoice.   IF you received labwork today, you will receive an invoice from [facility]. Please contact [facility] at [phone] with questions or concerns regarding your invoice.   Our billing staff will not be able to assist you with questions regarding bills from these companies.  You will be contacted with the lab results as soon as they are available. The fastest way to get your results is to activate your My Chart account. Instructions are located on the last page of this paperwork. If you have not heard from [place] regarding the results in 2 weeks, please contact this office.

## 2021-02-24 NOTE — Progress Notes (Signed)
Acute Office Visit  Subjective:    Patient ID: ANANDA SITZER [name], male    DOB: 08/20/1961, 60 y.o.   MRN: [id number]  Chief Complaint  Patient presents with   Ear Drainage    Patient states his left ear is stopped up but also feels like something is coming out of his ear for about 4-5 days.    HPI Patient is in today for impacted cerumen, tick bite, med refill  Impacted cerumen Bilateral. Muffled hearing Pressure, no pain No drainage Some sinus pressure but typical of his allergies  Tick bite Noted 3 days - 1 week ago R side of neck Thought it was skin tag Was going to ask about removal No hx of tick borne illness.  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 Denies hi/si  Past Medical History:  Diagnosis Date   Allergy    Anxiety    Atrial fibrillation ([place])    Bell palsy    Depression    Diastolic heart failure    Hypercholesterolemia    Hypertension    Pneumonia    Thyroid disease     Past Surgical History:  Procedure Laterality Date   CHOLECYSTECTOMY     HERNIA REPAIR     KNEE SURGERY     VASECTOMY      Family History  Problem Relation Age of Onset   Heart disease Mother    Heart attack Mother    Other Mother        thyroid problems   Kidney cancer Mother    Cirrhosis Father    Other Father        thyroid problems   Alcohol abuse Father    Hypertension Father    Mental illness Brother    Parkinsonism Brother     Social History   Socioeconomic History   Marital status: Married    Spouse name: Not on file   Number of children: 1   Years of education: Not on file   Highest education level: Not on file  Occupational History   Not on file  Tobacco Use   Smoking status: Every Day    Packs/day: 0.50    Years: 40.00    Pack years: 20.00    Types: Cigarettes    Start date: 09/14/1978   Smokeless tobacco: Never   Vaping Use   Vaping Use: Never used  Substance and Sexual Activity   Alcohol use: Yes    Alcohol/week: 0.0 standard drinks    Comment: rarely   Drug use: No   Sexual activity: Never    Birth control/protection: None  Other Topics Concern   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      Tobacco: 1 ppd x 30 years      Alcohol: rare     Regular exercise: walking daily   Caffeine use: daily      Social Determinants of Health   Financial Resource Strain: Not on file  Food Insecurity: Not on file  Transportation Needs: Not on file  Physical Activity: Not on file  Stress: Not on file  Social Connections: Not  on file  Intimate Partner Violence: Not on file    Outpatient Medications Prior to Visit  Medication Sig Dispense Refill   albuterol (PROVENTIL) (2.5 MG/3ML) 0.083% nebulizer solution Take 3 mLs (2.5 mg total) by nebulization every 6 (six) hours as needed for wheezing or shortness of breath. 150 mL 1   albuterol (VENTOLIN HFA) 108 (90 Base) MCG/ACT inhaler INHALE 2 PUFFS INTO THE LUNGS EVERY 6 HOURS AS NEEDED FOR WHEEZING/SHORTNESS OF BREATH 18 g 2   ALPRAZolam (XANAX) 1 MG tablet TAKE 1/2 TABLET BY MOUTH 3 TIMES A DAY 45 tablet 1   aspirin 81 MG tablet Take 81 mg by mouth every other day.     carvedilol (COREG) 12.5 MG tablet Take 1.5 tablets (18.75 mg total) by mouth 2 (two) times daily with a meal. 270 tablet 3   cloNIDine (CATAPRES - DOSED IN MG/24 HR) 0.1 mg/24hr patch PLACE 1 PATCH (0.1 MG TOTAL) ONTO THE SKIN ONCE A WEEK. 12 patch 1   flecainide (TAMBOCOR) 100 MG tablet TAKE 1 TABLET BY MOUTH TWICE A DAY 180 tablet 3   FLUoxetine (PROZAC) 40 MG capsule TAKE 2 CAPSULES BY MOUTH EVERY DAY 180 capsule 1   ipratropium (ATROVENT) 0.03 % nasal spray PLACE 2 SPRAYS INTO BOTH NOSTRILS 2 (TWO) TIMES  DAILY. USE 2 SPRAYS INTO BOTH NOSTRIL 3 TIMES DAILY 30 mL 0   lisinopril (ZESTRIL) 40 MG tablet TAKE 1 TABLET BY MOUTH EVERY DAY 90 tablet 1   ondansetron (ZOFRAN) 4 MG tablet Take 1 tablet (4 mg total) by mouth every 8 (eight) hours as needed for nausea or vomiting. 20 tablet 0   simvastatin (ZOCOR) 40 MG tablet TAKE 1 TABLET BY MOUTH EVERY DAY 90 tablet 1   SYNTHROID 175 MCG tablet Take 1 tablet (175 mcg total) by mouth daily before breakfast. 90 tablet 2   tamsulosin (FLOMAX) 0.4 MG CAPS capsule TAKE 2 CAPSULES BY MOUTH EVERY DAY 180 capsule 1   No facility-administered medications prior to visit.    Allergies  Allergen Reactions   Sulfa Drugs Cross Reactors Hives   Codeine Other (See Comments)    Made him very jittery    Hydrochlorothiazide Other (See Comments)    Hyponatremia    Review of Systems  Constitutional: Negative.   HENT:  Positive for ear pain (pressure).   Eyes: Negative.   Respiratory: Negative.    Cardiovascular: Negative.   Gastrointestinal: Negative.   Endocrine: Negative.   Genitourinary: Negative.   Musculoskeletal: Negative.   Skin: Negative.   Allergic/Immunologic: Negative.   Neurological: Negative.   Hematological: Negative.   Psychiatric/Behavioral: Negative.        Objective:    Physical Exam Vitals and nursing note reviewed.  Constitutional:      Appearance: Normal appearance.  HENT:     Right Ear: There is impacted cerumen.     Left Ear: There is impacted cerumen.  Cardiovascular:     Rate and Rhythm: Normal rate and regular rhythm.     Pulses: Normal pulses.     Heart sounds: Normal heart sounds. No murmur heard.   No friction rub. No gallop.  Pulmonary:     Effort: Pulmonary effort is normal. No respiratory distress.     Breath sounds: Normal breath sounds. No stridor. No wheezing, rhonchi or rales.  Skin:    Comments: L side of neck - lone star tick, moderately engorged  Neurological:     General: No focal deficit present.  Mental Status: He is alert. Mental status is at baseline.  Psychiatric:        Mood and Affect: Mood normal.        Behavior: Behavior normal.        Thought Content: Thought content normal.        Judgment: Judgment normal.    BP 125/62   Pulse 78   Temp 98.1 F (36.7 C) (Temporal)   Resp 17   Ht 5\' 9"  (1.753 m)   Wt 205 lb 12.8 oz (93.4 kg)   SpO2 99%   BMI 30.39 kg/m  Wt Readings from Last 3 Encounters:  02/24/21 205 lb 12.8 oz (93.4 kg)  11/12/20 203 lb (92.1 kg)  11/07/20 207 lb 8 oz (94.1 kg)    Health Maintenance Due  Topic Date Due   Pneumococcal Vaccine 56-51 Years old (1 - PCV) Never done   Zoster Vaccines- Shingrix (1 of 2) Never done   COLONOSCOPY (Pts 45-62yrs Insurance coverage will need to be confirmed)  02/13/2019   COVID-19 Vaccine (4 - Booster for [place] series) 01/18/2021    There are no preventive care reminders to display for this patient.   Lab Results  Component Value Date   TSH 0.78 11/06/2020   Lab Results  Component Value Date   WBC 12.1 (H) 08/13/2020   HGB 15.6 08/13/2020   HCT 45.8 08/13/2020   MCV 91.7 08/13/2020   PLT 222.0 08/13/2020   Lab Results  Component Value Date   NA 130 (L) 11/06/2020   K 3.8 11/06/2020   CO2 30 11/06/2020   GLUCOSE 91 11/06/2020   BUN 10 11/06/2020   CREATININE 0.92 11/06/2020   BILITOT 0.5 08/13/2020   ALKPHOS 80 08/13/2020   AST 16 08/13/2020   ALT 16 08/13/2020   PROT 7.0 08/13/2020   ALBUMIN 4.4 08/13/2020   CALCIUM 8.8 11/06/2020   ANIONGAP 8 09/30/2019   GFR 89.53 11/06/2020   Lab Results  Component Value Date   CHOL 173 08/13/2020   Lab Results  Component Value Date   HDL 46.00 08/13/2020   Lab Results  Component Value Date   LDLCALC 70 09/25/2019   Lab Results  Component Value Date   TRIG 208.0 (H) 08/13/2020   Lab Results  Component Value Date   CHOLHDL 4 08/13/2020   Lab Results  Component Value Date   HGBA1C 5.5 08/13/2020       Assessment & Plan:   Problem  List Items Addressed This Visit   None Visit Diagnoses     Tick bite of other part of neck, initial encounter    -  Primary   Relevant Medications   doxycycline (VIBRA-TABS) 100 MG tablet   Irritation of both ears       Relevant Medications   acetic acid-hydrocortisone (VOSOL-HC) OTIC solution        Meds ordered this encounter  Medications   doxycycline (VIBRA-TABS) 100 MG tablet    Sig: Take 1 tablet (100 mg total) by mouth 2 (two) times daily.    Dispense:  20 tablet    Refill:  0    Order Specific Question:   Supervising Provider    Answer:   Carlota Raspberry, JEFFREY R [[id number]]   acetic acid-hydrocortisone (VOSOL-HC) OTIC solution    Sig: Place 3 drops into both ears 2 (two) times daily.    Dispense:  10 mL    Refill:  0    Order Specific Question:   Supervising Provider  Answer:   Carlota Raspberry, JEFFREY R [[id number]]   PLAN Tick removed. Given duration of latching will start 10 day doxycycline course Ears disimpacted. Vosol-hc for residual inflammation Refill albuterol and alprazolam Must return to est care with new PCP Patient encouraged to call clinic with any questions, comments, or concerns.  Maximiano Coss, NP

## 2021-03-09 ENCOUNTER — Other Ambulatory Visit: Payer: Self-pay | Admitting: Endocrinology

## 2021-03-10 ENCOUNTER — Other Ambulatory Visit: Payer: Self-pay | Admitting: Registered Nurse

## 2021-03-10 ENCOUNTER — Telehealth: Payer: Self-pay | Admitting: Registered Nurse

## 2021-03-10 ENCOUNTER — Telehealth: Payer: Self-pay

## 2021-03-10 DIAGNOSIS — F5102 Adjustment insomnia: Secondary | ICD-10-CM

## 2021-03-10 MED ORDER — ALPRAZOLAM 1 MG PO TABS: 0.5000 mg | ORAL_TABLET | Freq: Every evening | ORAL | 0 refills | Status: DC | PRN

## 2021-03-10 NOTE — Telephone Encounter (Signed)
Pt called in stating that the pharmacy told pt that the they didn't receive the request for the Alprazolam. Pt can be reached at the home # [facility] [place].

## 2021-03-11 NOTE — Telephone Encounter (Signed)
Sent yesterday,  Thanks,  Denice Paradise

## 2021-03-12 NOTE — Telephone Encounter (Signed)
Encounter started in error.

## 2021-03-13 ENCOUNTER — Ambulatory Visit: Payer: 59 | Admitting: Family Medicine

## 2021-03-18 ENCOUNTER — Other Ambulatory Visit: Payer: Self-pay | Admitting: Endocrinology

## 2021-03-18 DIAGNOSIS — I10 Essential (primary) hypertension: Secondary | ICD-10-CM

## 2021-03-18 NOTE — Telephone Encounter (Signed)
Patient called to request the following new RX with refills:  MEDICATION:  lisinopril (ZESTRIL) 40 MG tablet  PHARMACY:   [id number] - [place],  - [address] [place] [address] [place] [address] Phone:  [phone]  Fax:  [phone]      HAS THE PATIENT CONTACTED THEIR PHARMACY?  Yes-Patient states that PHARM told Patient to call Dr. Dwyane Dee for RX refill  IS THIS A 90 DAY SUPPLY : Yes  IS PATIENT OUT OF MEDICATION: Yes  IF NOT; HOW MUCH IS LEFT: 0  LAST APPOINTMENT DATE: @6 /26/2022  NEXT APPOINTMENT DATE:@9 /02/2021  DO WE HAVE YOUR PERMISSION TO LEAVE A DETAILED MESSAGE?: Yes  OTHER COMMENTS:    **Let patient know to contact pharmacy at the end of the day to make sure medication is ready. **  ** Please notify patient to allow 48-72 hours to process**  **Encourage patient to contact the pharmacy for refills or they can request refills through [facility]**

## 2021-04-07 ENCOUNTER — Other Ambulatory Visit: Payer: Self-pay | Admitting: Endocrinology

## 2021-04-07 ENCOUNTER — Other Ambulatory Visit: Payer: Self-pay | Admitting: Family Medicine

## 2021-04-08 ENCOUNTER — Other Ambulatory Visit: Payer: Self-pay

## 2021-04-08 DIAGNOSIS — N319 Neuromuscular dysfunction of bladder, unspecified: Secondary | ICD-10-CM

## 2021-04-08 DIAGNOSIS — E785 Hyperlipidemia, unspecified: Secondary | ICD-10-CM

## 2021-04-08 MED ORDER — TAMSULOSIN HCL 0.4 MG PO CAPS: 0.8000 mg | ORAL_CAPSULE | Freq: Every day | ORAL | 0 refills | Status: DC

## 2021-04-08 MED ORDER — SIMVASTATIN 40 MG PO TABS: 40.0000 mg | ORAL_TABLET | Freq: Every day | ORAL | 1 refills | Status: DC

## 2021-05-13 ENCOUNTER — Other Ambulatory Visit: Payer: Self-pay

## 2021-05-13 ENCOUNTER — Telehealth: Payer: 59 | Admitting: Medical

## 2021-05-13 ENCOUNTER — Encounter (HOSPITAL_BASED_OUTPATIENT_CLINIC_OR_DEPARTMENT_OTHER): Payer: Self-pay | Admitting: *Deleted

## 2021-05-13 ENCOUNTER — Emergency Department (HOSPITAL_BASED_OUTPATIENT_CLINIC_OR_DEPARTMENT_OTHER)
Admission: EM | Admit: 2021-05-13 | Discharge: 2021-05-14 | Disposition: A | Discharge status: Home / Self Care | Payer: 59 | Attending: Emergency Medicine | Admitting: Emergency Medicine

## 2021-05-13 DIAGNOSIS — R197 Diarrhea, unspecified: Secondary | ICD-10-CM | POA: Insufficient documentation

## 2021-05-13 DIAGNOSIS — R1084 Generalized abdominal pain: Secondary | ICD-10-CM | POA: Insufficient documentation

## 2021-05-13 LAB — COMPREHENSIVE METABOLIC PANEL
ALT: 9 U/L (ref 0–44)
AST: 11 U/L — ABNORMAL LOW (ref 15–41)
Albumin: 3.7 g/dL (ref 3.5–5.0)
Alkaline Phosphatase: 67 U/L (ref 38–126)
Anion gap: 10 (ref 5–15)
BUN: 9 mg/dL (ref 8–23)
CO2: 26 mmol/L (ref 22–32)
Calcium: 8.6 mg/dL — ABNORMAL LOW (ref 8.9–10.3)
Chloride: 99 mmol/L (ref 98–111)
Creatinine, Ser: 0.85 mg/dL (ref 0.61–1.24)
GFR, Estimated: >60 mL/min (ref >60)
Glucose, Bld: 137 mg/dL — ABNORMAL HIGH (ref 70–99)
Potassium: 3.4 mmol/L — ABNORMAL LOW (ref 3.5–5.1)
Sodium: 135 mmol/L (ref 135–145)
Total Bilirubin: 0.4 mg/dL (ref 0.3–1.2)
Total Protein: 6.5 g/dL (ref 6.5–8.1)

## 2021-05-13 LAB — URINALYSIS, ROUTINE W REFLEX MICROSCOPIC
Bilirubin Urine: NEGATIVE
Glucose, UA: NEGATIVE mg/dL
Hgb urine dipstick: NEGATIVE
Ketones, ur: NEGATIVE mg/dL
Leukocytes,Ua: NEGATIVE
Nitrite: NEGATIVE
Protein, ur: NEGATIVE mg/dL
Specific Gravity, Urine: 1.016 (ref 1.005–1.030)
pH: 6 (ref 5.0–8.0)

## 2021-05-13 LAB — LIPASE, BLOOD: Lipase: 31 U/L (ref 11–51)

## 2021-05-13 LAB — CBC
HCT: 43.7 % (ref 39.0–52.0)
Hemoglobin: 15 g/dL (ref 13.0–17.0)
MCH: 30.8 pg (ref 26.0–34.0)
MCHC: 34.3 g/dL (ref 30.0–36.0)
MCV: 89.7 fL (ref 80.0–100.0)
Platelets: 198 10*3/uL (ref 150–400)
RBC: 4.87 MIL/uL (ref 4.22–5.81)
RDW: 12.8 % (ref 11.5–15.5)
WBC: 10.7 10*3/uL — ABNORMAL HIGH (ref 4.0–10.5)
nRBC: 0 % (ref 0.0–0.2)

## 2021-05-13 MED ORDER — ONDANSETRON HCL 4 MG/2ML IJ SOLN
4.0000 mg | Freq: Once | INTRAMUSCULAR | Status: AC
  Administered 2021-05-14: 4 mg via INTRAVENOUS
  Filled 2021-05-13: qty 2

## 2021-05-13 MED ORDER — SODIUM CHLORIDE 0.9 % IV BOLUS
1000.0000 mL | Freq: Once | INTRAVENOUS | Status: AC
  Administered 2021-05-14: 1000 mL via INTRAVENOUS

## 2021-05-13 NOTE — ED Triage Notes (Signed)
Pt ate a quarter pound hamburger with cheese last Friday and has been having diarrhea and vomiting.

## 2021-05-14 ENCOUNTER — Emergency Department (HOSPITAL_BASED_OUTPATIENT_CLINIC_OR_DEPARTMENT_OTHER): Payer: 59

## 2021-05-14 ENCOUNTER — Encounter (HOSPITAL_BASED_OUTPATIENT_CLINIC_OR_DEPARTMENT_OTHER): Payer: Self-pay | Admitting: Radiology

## 2021-05-14 ENCOUNTER — Other Ambulatory Visit: Payer: Self-pay

## 2021-05-14 MED ORDER — ONDANSETRON 8 MG PO TBDP
8.0000 mg | ORAL_TABLET | Freq: Three times a day (TID) | ORAL | 0 refills | Status: DC | PRN
Start: 2021-05-14 — End: 2022-01-06

## 2021-05-14 MED ORDER — IOHEXOL 350 MG/ML SOLN
100.0000 mL | Freq: Once | INTRAVENOUS | Status: AC | PRN
  Administered 2021-05-14: 75 mL via INTRAVENOUS

## 2021-05-14 MED ORDER — ONDANSETRON HCL 4 MG/2ML IJ SOLN
4.0000 mg | Freq: Once | INTRAMUSCULAR | Status: AC
  Administered 2021-05-14: 4 mg via INTRAVENOUS
  Filled 2021-05-14: qty 2

## 2021-05-14 NOTE — ED Provider Notes (Signed)
[facility] EMERGENCY DEPT Provider Note   CSN: [id number] Arrival date & time: 05/13/21  1707     History Chief Complaint  Patient presents with   Emesis   Diarrhea    Jesse SANTOPIETRO [name] is a 60 y.o. male.  The history is provided by the patient.  Diarrhea Quality:  Watery and copious Severity:  Severe Onset quality:  Gradual Duration:  4 days Timing:  Intermittent Progression:  Worsening Relieved by:  Nothing Worsened by:  Nothing Associated symptoms: abdominal pain and cough   Associated symptoms: no fever and no vomiting   Risk factors: suspect food intake   Patient with history of anxiety, atrial fibrillation, hypertension presents with abdominal pain and diarrhea.  Patient reports he ate [employer] about 4 days ago and soon after began having nausea and diarrhea.  No vomiting No sick contacts.    Past Medical History:  Diagnosis Date   Allergy    Anxiety    Atrial fibrillation ([place])    Bell palsy    Depression    Diastolic heart failure    Hypercholesterolemia    Hypertension    Pneumonia    Thyroid disease     Patient Active Problem List   Diagnosis Date Noted   COPD GOLD 0/ emphysema on CT  03/15/2019   Visit for preventive health examination 02/03/2018   Prostate cancer screening 02/03/2018   Colon cancer screening 02/03/2018   Need for Tdap vaccination 02/03/2018   PVC's (premature ventricular contractions) 01/06/2017   Bone neoplasm 02/25/2015   Cigarette smoker 08/07/2013   Depression with anxiety 02/20/2012   ED (erectile dysfunction) 02/20/2012   Hx of Bell's palsy 02/20/2012   Amputated toe ([place]) 02/20/2012   Paroxysmal atrial fibrillation ([place]) 01/15/2011   Hypothyroidism following radioiodine therapy 01/15/2011   Essential hypertension 01/15/2011   Dyslipidemia 01/15/2011    Past Surgical History:  Procedure Laterality Date   CHOLECYSTECTOMY     HERNIA REPAIR     KNEE SURGERY     VASECTOMY         Family  History  Problem Relation Age of Onset   Heart disease Mother    Heart attack Mother    Other Mother        thyroid problems   Kidney cancer Mother    Cirrhosis Father    Other Father        thyroid problems   Alcohol abuse Father    Hypertension Father    Mental illness Brother    Parkinsonism Brother     Social History   Tobacco Use   Smoking status: Every Day    Packs/day: 0.50    Years: 40.00    Pack years: 20.00    Types: Cigarettes    Start date: 09/14/1978   Smokeless tobacco: Never  Vaping Use   Vaping Use: Never used  Substance Use Topics   Alcohol use: Yes    Alcohol/week: 0.0 standard drinks    Comment: rarely   Drug use: No    Home Medications Prior to Admission medications   Medication Sig Start Date End Date Taking? Authorizing Provider  albuterol (VENTOLIN HFA) 108 (90 Base) MCG/ACT inhaler INHALE 2 PUFFS INTO THE LUNGS EVERY 6 HOURS AS NEEDED FOR WHEEZING/SHORTNESS OF BREATH 02/24/21  Yes Maximiano Coss, NP  ALPRAZolam [name] 1 MG tablet Take 0.5-1 tablets (0.5-1 mg total) by mouth at bedtime as needed for anxiety. 03/10/21  Yes Maximiano Coss, NP  carvedilol (COREG) 12.5 MG tablet Take  1.5 tablets (18.75 mg total) by mouth 2 (two) times daily with a meal. 10/02/20  Yes Evans Lance, MD  cloNIDine (CATAPRES - DOSED IN MG/24 HR) 0.1 mg/24hr patch PLACE 1 PATCH (0.1 MG TOTAL) ONTO THE SKIN ONCE A WEEK. 03/10/21  Yes [name], MD  flecainide (TAMBOCOR) 100 MG tablet TAKE 1 TABLET BY MOUTH TWICE A DAY 09/18/20  Yes Evans Lance, MD  FLUoxetine (PROZAC) 40 MG capsule TAKE 2 CAPSULES BY MOUTH EVERY DAY 04/07/21  Yes Maximiano Coss, NP  lisinopril (ZESTRIL) 40 MG tablet TAKE 1 TABLET BY MOUTH EVERY DAY 03/18/21  Yes [name], MD  ondansetron (ZOFRAN ODT) 8 MG disintegrating tablet Take 1 tablet (8 mg total) by mouth every 8 (eight) hours as needed. 05/14/21  Yes Ripley Fraise, MD  simvastatin (ZOCOR) 40 MG tablet Take 1 tablet (40 mg total) by mouth  daily. 04/08/21  Yes Maximiano Coss, NP  SYNTHROID 175 MCG tablet TAKE 1 TABLET BY MOUTH DAILY BEFORE BREAKFAST. 04/07/21  Yes [name], MD  tamsulosin (FLOMAX) 0.4 MG CAPS capsule Take 2 capsules (0.8 mg total) by mouth daily. 04/08/21  Yes Maximiano Coss, NP  acetic acid-hydrocortisone (VOSOL-HC) OTIC solution Place 3 drops into both ears 2 (two) times daily. 02/24/21   Maximiano Coss, NP  albuterol (PROVENTIL) (2.5 MG/3ML) 0.083% nebulizer solution Take 3 mLs (2.5 mg total) by nebulization every 6 (six) hours as needed for wheezing or shortness of breath. 08/17/18   Brunetta Jeans, PA-C  aspirin 81 MG tablet Take 81 mg by mouth every other day.    [provider]    Allergies    Sulfa drugs cross reactors, Codeine, and Hydrochlorothiazide  Review of Systems   Review of Systems  Constitutional:  Negative for fever.  Respiratory:  Positive for cough.   Gastrointestinal:  Positive for abdominal pain, diarrhea and nausea. Negative for blood in stool, constipation and vomiting.  All other systems reviewed and are negative.  Physical Exam Updated Vital Signs BP (!) 201/96   Pulse (!) 52   Temp 98.6 F (37 C)   Resp 16   Ht 1.753 m ('5\' 9"'$ )   Wt 86.2 kg   SpO2 96%   BMI 28.06 kg/m   Physical Exam CONSTITUTIONAL: Chronically ill-appearing HEAD: Normocephalic/atraumatic EYES: EOMI/PERRL, no icterus ENMT: Mucous membranes dry NECK: supple no meningeal signs SPINE/BACK:entire spine nontender CV: S1/S2 noted, no murmurs/rubs/gallops noted LUNGS: Lungs are clear to auscultation bilaterally, no apparent distress ABDOMEN: soft, diffuse mild tenderness, no rebound or guarding, bowel sounds noted throughout abdomen GU:no cva tenderness NEURO: Pt is awake/alert/appropriate, moves all extremitiesx4.  No facial droop.   EXTREMITIES: pulses normal/equal, full ROM SKIN: warm, color normal PSYCH: no abnormalities of mood noted, alert and oriented to situation  ED Results /  Procedures / Treatments   Labs (all labs ordered are listed, but only abnormal results are displayed) Labs Reviewed  COMPREHENSIVE METABOLIC PANEL - Abnormal; Notable for the following components:      Result Value   Potassium 3.4 (*)    Glucose, Bld 137 (*)    Calcium 8.6 (*)    AST 11 (*)    All other components within normal limits  CBC - Abnormal; Notable for the following components:   WBC 10.7 (*)    All other components within normal limits  LIPASE, BLOOD  URINALYSIS, ROUTINE W REFLEX MICROSCOPIC    EKG ED ECG REPORT   Date: 05/14/2021 0017am  Rate: 57  Rhythm: normal  sinus rhythm  QRS Axis: right  Intervals: normal  ST/T Wave abnormalities: nonspecific T wave changes  Conduction Disutrbances:none  Narrative Interpretation:   Old EKG Reviewed: unchanged  I have personally reviewed the EKG tracing and agree with the computerized printout as noted.   Radiology CT ABDOMEN PELVIS W CONTRAST  Result Date: 05/14/2021 CLINICAL DATA:  Acute abdominal pain. EXAM: CT ABDOMEN AND PELVIS WITH CONTRAST TECHNIQUE: Multidetector CT imaging of the abdomen and pelvis was performed using the standard protocol following bolus administration of intravenous contrast. CONTRAST:  73m OMNIPAQUE IOHEXOL 350 MG/ML SOLN COMPARISON:  CT abdomen pelvis dated 05/07/2018. FINDINGS: Lower chest: Trace right pleural effusion. There are bibasilar linear atelectasis/scarring. There is mild eventration of the right hemidiaphragm. Coronary vascular calcification. Intra-abdominal free air or free fluid. Hepatobiliary: The liver is unremarkable. No intrahepatic biliary dilatation. Cholecystectomy. Retained calcified stone noted in the central CBD. Pancreas: Unremarkable. No pancreatic ductal dilatation or surrounding inflammatory changes. Spleen: Normal in size without focal abnormality. Adrenals/Urinary Tract: The adrenal glands are unremarkable. There is no hydronephrosis on either side. There is symmetric  enhancement and excretion of contrast by both kidneys. Small bilateral renal cysts as seen on the prior CT. The visualized ureters and urinary bladder appear unremarkable. Stomach/Bowel: There is sigmoid diverticulosis without active inflammatory changes. There is no bowel obstruction or active inflammation. The appendix is normal. Vascular/Lymphatic: Mild aortoiliac atherosclerotic disease. The IVC is unremarkable. No portal venous gas. There is no adenopathy. Reproductive: The prostate and seminal vesicles are grossly unremarkable. No pelvic mass. Other: None Musculoskeletal: Osteopenia with degenerative changes of the spine. No acute osseous pathology. IMPRESSION: 1. No acute intra-abdominal or pelvic pathology. 2. Sigmoid diverticulosis. No bowel obstruction. Normal appendix. 3. Trace right pleural effusion. 4. Aortic Atherosclerosis (ICD10-I70.0). Electronically Signed   By: AAnner CreteM.D.   On: 05/14/2021 01:00    Procedures Procedures   Medications Ordered in ED Medications  ondansetron (ZOFRAN) injection 4 mg (has no administration in time range)  ondansetron (ZOFRAN) injection 4 mg (4 mg Intravenous Given 05/14/21 0016)  sodium chloride 0.9 % bolus 1,000 mL (1,000 mLs Intravenous New Bag/Given 05/14/21 0015)  iohexol (OMNIPAQUE) 350 MG/ML injection 100 mL (75 mLs Intravenous Contrast Given 05/14/21 0028)    ED Course  I have reviewed the triage vital signs and the nursing notes.  Pertinent labs & imaging results that were available during my care of the patient were reviewed by me and considered in my medical decision making (see chart for details).    MDM Rules/Calculators/A&P                           Patient with nausea, diarrhea and continued abdominal pain for up to 4 days.  Patient suspects food intake.  Due to pain, will proceed with CT abdomen/pelvis 2:07 AM No acute findings on CT Patient improved.  No vomiting or diarrhea here.  Patient denies any recent antibiotics.   He appears low risk for C. difficile suspect this could be related to food [place] for [place].  Referred him to his PCP if no improvement in 48 hours Final Clinical Impression(s) / ED Diagnoses Final diagnoses:  Diarrhea of presumed infectious origin  Generalized abdominal pain    Rx / DC Orders ED Discharge Orders          Ordered    ondansetron (ZOFRAN ODT) 8 MG disintegrating tablet  Every 8 hours PRN  05/14/21 [id number]             Ripley Fraise, MD 05/14/21 [phone]

## 2021-05-20 ENCOUNTER — Other Ambulatory Visit: Payer: Self-pay

## 2021-05-20 ENCOUNTER — Other Ambulatory Visit (INDEPENDENT_AMBULATORY_CARE_PROVIDER_SITE_OTHER): Payer: 59

## 2021-05-20 DIAGNOSIS — I10 Essential (primary) hypertension: Secondary | ICD-10-CM

## 2021-05-20 DIAGNOSIS — E890 Postprocedural hypothyroidism: Secondary | ICD-10-CM | POA: Diagnosis not present

## 2021-05-20 DIAGNOSIS — E871 Hypo-osmolality and hyponatremia: Secondary | ICD-10-CM | POA: Diagnosis not present

## 2021-05-20 LAB — T4, FREE: Free T4: 1.21 ng/dL (ref 0.60–1.60)

## 2021-05-20 LAB — BASIC METABOLIC PANEL
BUN: 6 mg/dL (ref 6–23)
CO2: 31 mEq/L (ref 19–32)
Calcium: 9 mg/dL (ref 8.4–10.5)
Chloride: 98 mEq/L (ref 96–112)
Creatinine, Ser: 0.91 mg/dL (ref 0.40–1.50)
GFR: 90.37 mL/min (ref >60.00)
Glucose, Bld: 106 mg/dL — ABNORMAL HIGH (ref 70–99)
Potassium: 3.8 mEq/L (ref 3.5–5.1)
Sodium: 136 mEq/L (ref 135–145)

## 2021-05-20 LAB — TSH: TSH: 0.15 u[IU]/mL — ABNORMAL LOW (ref 0.35–5.50)

## 2021-05-21 ENCOUNTER — Encounter: Payer: 59 | Admitting: Registered Nurse

## 2021-05-21 LAB — OSMOLALITY, URINE: Osmolality, Ur: 460 mOsmol/kg

## 2021-05-22 ENCOUNTER — Ambulatory Visit: Payer: 59 | Admitting: Endocrinology

## 2021-05-22 ENCOUNTER — Other Ambulatory Visit: Payer: Self-pay | Admitting: Endocrinology

## 2021-05-22 ENCOUNTER — Other Ambulatory Visit: Payer: Self-pay

## 2021-05-22 ENCOUNTER — Encounter: Payer: Self-pay | Admitting: Endocrinology

## 2021-05-22 VITALS — BP 135/85 | HR 56 | Ht 69.0 in | Wt 204.0 lb

## 2021-05-22 DIAGNOSIS — E871 Hypo-osmolality and hyponatremia: Secondary | ICD-10-CM

## 2021-05-22 DIAGNOSIS — E890 Postprocedural hypothyroidism: Secondary | ICD-10-CM

## 2021-05-22 DIAGNOSIS — I10 Essential (primary) hypertension: Secondary | ICD-10-CM | POA: Diagnosis not present

## 2021-05-22 NOTE — Patient Instructions (Signed)
Take 1/2 Synthroid on Sundays

## 2021-05-22 NOTE — Progress Notes (Signed)
Patient ID: Jesse Europe [name], male   DOB: 28-Sep-1960, 60 y.o.   MRN: [id number]   Reason for Appointment: Follow-up    History of Present Illness:   The hypothyroidism was first diagnosed in 11/13. Previously had I-131 treatment for Graves' disease He was initially started with 88 mcg but his dose needed to be increased progressively  He has been treated with brand name SYNTHROID, the dose has been adjusted previously based on his labs  He is taking 175 mcg brand-name Synthroid   His dose was increased in 12/21 and at that time he was having more fatigue which is improved  TSH is now low, was 0.8 compared to 14.6 previously His dose was not changed on the last visit  He has been continued on the brand name Synthroid  He takes this every morning before breakfast Has not missed any doses of his Synthroid regimen  Wt Readings from Last 3 Encounters:  05/22/21 204 lb (92.5 kg)  05/13/21 190 lb (86.2 kg)  02/24/21 205 lb 12.8 oz (93.4 kg)    LABS:   Lab Results  Component Value Date   TSH 0.15 (L) 05/20/2021   TSH 0.78 11/06/2020   TSH 14.56 (H) 08/26/2020   FREET4 1.21 05/20/2021   FREET4 1.30 11/06/2020   FREET4 0.69 08/26/2020      HYPERTENSION:  He has had long-standing hypertension which is  managed with lisinopril 40, Catapres 0.1 mg patch and Coreg 1-1/2 tablets twice a day He was told to stop hydrochlorothiazide which was causing hyponatremia  He was evaluated for secondary hypertension and reports of his aldosterone/renin indicate normal levels Also was supposed to get the renal artery ultrasound but this was not done  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 He has not missed any doses of his medications and changes his clonidine patch on Sundays  Blood pressure has been elsewhere near normal, generally ranging from 120-130/80-85 Blood pressure was higher on first measurement, he thinks this is from  recent stress with his wife's illness and not resting last night  BP Readings from Last 3 Encounters:  05/22/21 135/85  05/14/21 (!) 149/80  02/24/21 125/62       Allergies as of 05/22/2021       Reactions   Sulfa Drugs Cross Reactors Hives   Codeine Other (See Comments)   Made him very jittery    Hydrochlorothiazide Other (See Comments)   Hyponatremia        Medication List        Accurate as of May 22, 2021 12:30 PM. If you have any questions, ask your nurse or doctor.          acetic acid-hydrocortisone OTIC solution Commonly known as: VOSOL-HC Place 3 drops into both ears 2 (two) times daily.   albuterol (2.5 MG/3ML) 0.083% nebulizer solution Commonly known as: PROVENTIL Take 3 mLs (2.5 mg total) by nebulization every 6 (six) hours as needed for wheezing or shortness of breath.   albuterol 108 (90 Base) MCG/ACT inhaler Commonly known as: VENTOLIN HFA INHALE 2 PUFFS INTO THE LUNGS EVERY 6 HOURS AS NEEDED FOR WHEEZING/SHORTNESS OF BREATH   ALPRAZolam 1 MG tablet Commonly known as: XANAX Take 0.5-1 tablets (0.5-1 mg total) by mouth at bedtime as needed for anxiety.   aspirin 81 MG tablet Take 81 mg by mouth every other day.   carvedilol 12.5 MG tablet Commonly known as: COREG Take 1.5 tablets (18.75  mg total) by mouth 2 (two) times daily with a meal.   cloNIDine 0.1 mg/24hr patch Commonly known as: CATAPRES - Dosed in mg/24 hr PLACE 1 PATCH (0.1 MG TOTAL) ONTO THE SKIN ONCE A WEEK.   flecainide 100 MG tablet Commonly known as: TAMBOCOR TAKE 1 TABLET BY MOUTH TWICE A DAY   FLUoxetine 40 MG capsule Commonly known as: PROZAC TAKE 2 CAPSULES BY MOUTH EVERY DAY   lisinopril 40 MG tablet Commonly known as: ZESTRIL TAKE 1 TABLET BY MOUTH EVERY DAY   ondansetron 8 MG disintegrating tablet Commonly known as: Zofran ODT Take 1 tablet (8 mg total) by mouth every 8 (eight) hours as needed.   simvastatin 40 MG tablet Commonly known as: ZOCOR Take 1  tablet (40 mg total) by mouth daily.   Synthroid 175 MCG tablet Generic drug: levothyroxine TAKE 1 TABLET BY MOUTH DAILY BEFORE BREAKFAST.   tamsulosin 0.4 MG Caps capsule Commonly known as: FLOMAX Take 2 capsules (0.8 mg total) by mouth daily.        Past Medical History:  Diagnosis Date   Allergy    Anxiety    Atrial fibrillation ([place])    Bell palsy    Depression    Diastolic heart failure    Hypercholesterolemia    Hypertension    Pneumonia    Thyroid disease     Past Surgical History:  Procedure Laterality Date   CHOLECYSTECTOMY     HERNIA REPAIR     KNEE SURGERY     VASECTOMY      Family History  Problem Relation Age of Onset   Heart disease Mother    Heart attack Mother    Other Mother        thyroid problems   Kidney cancer Mother    Cirrhosis Father    Other Father        thyroid problems   Alcohol abuse Father    Hypertension Father    Mental illness Brother    Parkinsonism Brother     Social History:  reports that he has been smoking cigarettes. He started smoking about 42 years ago. He has a 20.00 pack-year smoking history. He has never used smokeless tobacco. He reports current alcohol use. He reports that he does not use drugs.  Allergies:  Allergies  Allergen Reactions   Sulfa Drugs Cross Reactors Hives   Codeine Other (See Comments)    Made him very jittery    Hydrochlorothiazide Other (See Comments)    Hyponatremia    REVIEW of systems:   HYPONATREMIA: His sodium had been better with stopping HCTZ  However it tends to be below normal and has been as low as 130 recently  He was told to cut back on [facility] but he still drinks some of this  Overall may have cut back on his overall fluid intake as sodium is improved Probably has SIADH  He still taking 80 mg of Prozac from his PCP  CT scan of his chest did not show any tumor  Lab Results  Component Value Date   NA 136 05/20/2021   K 3.8 05/20/2021   CL 98 05/20/2021    CO2 31 05/20/2021      Examination:   BP 135/85   Pulse (!) 56   Ht '5\' 9"'$  (1.753 m)   Wt 204 lb (92.5 kg)   SpO2 96%   BMI 30.13 kg/m       Assessments    HYPERTENSION:   His  blood pressure today is high normal diastolic  However better controlled at home although he needs to bring his monitor for comparison to the  He will continue his same drug regimen and make sure he takes all his medications regularly  HYPOTHYROIDISM:  secondary to I-131 treatment  His TSH is now suppressed even though he was well controlled on his last visit with 175 mcg Synthroid  Not showing any symptoms of over replacement and unable to cut back by half tablet weekly with  Hyponatremia: Likely has mild SIADH from Prozac Urine osmolality is high at 460  On fluid restriction and sodium is back to normal   [name] 05/22/2021, 12:30 PM

## 2021-05-26 ENCOUNTER — Other Ambulatory Visit: Payer: Self-pay | Admitting: Registered Nurse

## 2021-05-26 DIAGNOSIS — N319 Neuromuscular dysfunction of bladder, unspecified: Secondary | ICD-10-CM

## 2021-05-28 ENCOUNTER — Telehealth: Payer: Self-pay

## 2021-05-28 ENCOUNTER — Other Ambulatory Visit: Payer: Self-pay | Admitting: Registered Nurse

## 2021-05-28 ENCOUNTER — Other Ambulatory Visit: Payer: Self-pay

## 2021-05-28 DIAGNOSIS — R109 Unspecified abdominal pain: Secondary | ICD-10-CM

## 2021-05-28 DIAGNOSIS — I480 Paroxysmal atrial fibrillation: Secondary | ICD-10-CM

## 2021-05-28 MED ORDER — PANTOPRAZOLE SODIUM 40 MG PO TBEC: 40.0000 mg | DELAYED_RELEASE_TABLET | Freq: Every day | ORAL | 0 refills | Status: DC

## 2021-05-29 NOTE — Telephone Encounter (Signed)
Patient medication was sent to the pharmacy 

## 2021-06-03 ENCOUNTER — Other Ambulatory Visit: Payer: Self-pay | Admitting: Registered Nurse

## 2021-06-03 DIAGNOSIS — J449 Chronic obstructive pulmonary disease, unspecified: Secondary | ICD-10-CM

## 2021-06-26 ENCOUNTER — Encounter: Payer: Self-pay | Admitting: Registered Nurse

## 2021-06-26 ENCOUNTER — Ambulatory Visit: Payer: 59 | Admitting: Registered Nurse

## 2021-06-26 ENCOUNTER — Other Ambulatory Visit: Payer: Self-pay | Admitting: Registered Nurse

## 2021-06-26 ENCOUNTER — Other Ambulatory Visit: Payer: Self-pay

## 2021-06-26 VITALS — BP 157/95 | HR 63 | Temp 98.0°F | Resp 16 | Ht 69.0 in | Wt 204.2 lb

## 2021-06-26 DIAGNOSIS — F418 Other specified anxiety disorders: Secondary | ICD-10-CM

## 2021-06-26 DIAGNOSIS — Z1329 Encounter for screening for other suspected endocrine disorder: Secondary | ICD-10-CM | POA: Diagnosis not present

## 2021-06-26 DIAGNOSIS — Z13220 Encounter for screening for lipoid disorders: Secondary | ICD-10-CM | POA: Diagnosis not present

## 2021-06-26 DIAGNOSIS — Z13228 Encounter for screening for other metabolic disorders: Secondary | ICD-10-CM | POA: Diagnosis not present

## 2021-06-26 DIAGNOSIS — Z23 Encounter for immunization: Secondary | ICD-10-CM | POA: Diagnosis not present

## 2021-06-26 DIAGNOSIS — Z130 Encounter for screening for diseases of the blood and blood-forming organs and certain disorders involving the immune mechanism: Secondary | ICD-10-CM

## 2021-06-26 DIAGNOSIS — J019 Acute sinusitis, unspecified: Secondary | ICD-10-CM

## 2021-06-26 DIAGNOSIS — R109 Unspecified abdominal pain: Secondary | ICD-10-CM

## 2021-06-26 DIAGNOSIS — B9689 Other specified bacterial agents as the cause of diseases classified elsewhere: Secondary | ICD-10-CM

## 2021-06-26 LAB — HEMOGLOBIN A1C: Hgb A1c MFr Bld: 5.6 % (ref 4.6–6.5)

## 2021-06-26 LAB — CBC WITH DIFFERENTIAL/PLATELET
Basophils Absolute: 0 10*3/uL (ref 0.0–0.1)
Basophils Relative: 0.2 % (ref 0.0–3.0)
Eosinophils Absolute: 0.2 10*3/uL (ref 0.0–0.7)
Eosinophils Relative: 1.4 % (ref 0.0–5.0)
HCT: 47.1 % (ref 39.0–52.0)
Hemoglobin: 15.8 g/dL (ref 13.0–17.0)
Lymphocytes Relative: 18.7 % (ref 12.0–46.0)
Lymphs Abs: 2 10*3/uL (ref 0.7–4.0)
MCHC: 33.6 g/dL (ref 30.0–36.0)
MCV: 89.3 fl (ref 78.0–100.0)
Monocytes Absolute: 0.9 10*3/uL (ref 0.1–1.0)
Monocytes Relative: 8.5 % (ref 3.0–12.0)
Neutro Abs: 7.7 10*3/uL (ref 1.4–7.7)
Neutrophils Relative %: 71.2 % (ref 43.0–77.0)
Platelets: 211 10*3/uL (ref 150.0–400.0)
RBC: 5.27 Mil/uL (ref 4.22–5.81)
RDW: 13.5 % (ref 11.5–15.5)
WBC: 10.8 10*3/uL — ABNORMAL HIGH (ref 4.0–10.5)

## 2021-06-26 LAB — LIPID PANEL
Cholesterol: 185 mg/dL (ref 0–200)
HDL: 40.4 mg/dL (ref >39.00)
LDL Cholesterol: 106 mg/dL — ABNORMAL HIGH (ref 0–99)
NonHDL: 144.29
Total CHOL/HDL Ratio: 5
Triglycerides: 190 mg/dL — ABNORMAL HIGH (ref 0.0–149.0)
VLDL: 38 mg/dL (ref 0.0–40.0)

## 2021-06-26 MED ORDER — AZELASTINE HCL 0.1 % NA SOLN: 1.0000 | Freq: Two times a day (BID) | NASAL | 12 refills | Status: DC

## 2021-06-26 MED ORDER — FLUOXETINE HCL 20 MG PO CAPS: 20.0000 mg | ORAL_CAPSULE | Freq: Every day | ORAL | 0 refills | Status: DC

## 2021-06-26 MED ORDER — SERTRALINE HCL 50 MG PO TABS
50.0000 mg | ORAL_TABLET | Freq: Every day | ORAL | 0 refills | Status: DC
Start: 2021-06-26 — End: 2021-08-11

## 2021-06-26 MED ORDER — AMOXICILLIN-POT CLAVULANATE 875-125 MG PO TABS: 1.0000 | ORAL_TABLET | Freq: Two times a day (BID) | ORAL | 0 refills | Status: DC

## 2021-06-26 NOTE — Patient Instructions (Addendum)
Jesse Suarez -   Doristine Devoid to see you  Half dose of prozac for a week, then nothing for a week Then start on sertraline 50mg  daily. If tolerating it well after 3-4 weeks, can increase to 100mg  daily.  Let's check in at around the 8 week mark.  Labs today will be back tomorrow  See you soon, call me if you need anything in the interim.  Thank you  Rich    If you have lab work done today you will be contacted with your lab results within the next 2 weeks.  If you have not heard from [place] then please contact us. The fastest way to get your results is to register for My Chart.   IF you received an x-ray today, you will receive an invoice from [facility]iology. Please contact [facility] at [phone] with questions or concerns regarding your invoice.   IF you received labwork today, you will receive an invoice from [facility]. Please contact [facility] at [phone] with questions or concerns regarding your invoice.   Our billing staff will not be able to assist you with questions regarding bills from these companies.  You will be contacted with the lab results as soon as they are available. The fastest way to get your results is to activate your My Chart account. Instructions are located on the last page of this paperwork. If you have not heard from [place] regarding the results in 2 weeks, please contact this office.

## 2021-06-26 NOTE — Progress Notes (Signed)
Established Patient Office Visit  Subjective:  Patient ID: Jesse STENCIL [name], male    DOB: 10/02/1960  Age: 60 y.o. MRN: [id number]  CC:  Chief Complaint  Patient presents with   Transitions Of Care    Patient states he is here for a TOC and needs medication refill/ sinus infection for about 4-5 days.    HPI Jesse LITAKER [name] 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 Has been on prozac, however, feels effect is waning. Would like to change Has not been on any other SSRI in past.   Sinus Congestion, pressure, drainage for around 1 week Sick contacts as he has been in and out of hospital with his wife. Negative covid test. Vaccinated and boosted x 2. No systemic symptoms, no lower respiratory symptoms  immunization Due for shingles vaccine. Hx of chicken pox as child Has not rec'd zostavax or shingrix in past.  Past Medical History:  Diagnosis Date   Allergy    Anxiety    Atrial fibrillation ([place])    Bell palsy    Depression    Diastolic heart failure    Hypercholesterolemia    Hypertension    Pneumonia    Thyroid disease     Past Surgical History:  Procedure Laterality Date   CHOLECYSTECTOMY     HERNIA REPAIR     KNEE SURGERY     VASECTOMY      Family History  Problem Relation Age of Onset   Heart disease Mother    Heart attack Mother    Other Mother        thyroid problems   Kidney cancer Mother    Cirrhosis Father    Other Father        thyroid problems   Alcohol abuse Father    Hypertension Father    Mental illness Brother    Parkinsonism Brother     Social History   Socioeconomic History   Marital status: Married    Spouse name: Not on file   Number of children: 1   Years of education: Not on file   Highest education level: Not on file  Occupational History   Not on file  Tobacco Use   Smoking status: Every  Day    Packs/day: 0.50    Years: 40.00    Pack years: 20.00    Types: Cigarettes    Start date: 09/14/1978   Smokeless tobacco: Never  Vaping Use   Vaping Use: Never used  Substance and Sexual Activity   Alcohol use: Yes    Alcohol/week: 0.0 standard drinks    Comment: rarely   Drug use: No   Sexual activity: Never    Birth control/protection: None  Other Topics Concern   Not on file  Social History Narrative   Marital status: married x 30+ years      Children:  2 children;(38,37); 3+3 grandchild      Lives:  With wife, son      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 10 mL 0   albuterol (PROVENTIL) (2.5 MG/3ML) 0.083% nebulizer solution Take 3 mLs (2.5 mg total) by nebulization every 6 (six) hours as needed for wheezing or shortness of breath. 150 mL 1   albuterol (VENTOLIN HFA) 108 (90 Base) MCG/ACT inhaler INHALE 2 PUFFS INTO THE LUNGS EVERY 6 HOURS AS NEEDED FOR WHEEZING/SHORTNESS OF BREATH 17 each 2   ALPRAZolam (XANAX) 1 MG tablet Take 0.5-1 tablets (0.5-1 mg total) by mouth at bedtime as needed for anxiety. 90 tablet 0   aspirin 81 MG tablet Take 81 mg by mouth every other day.     carvedilol (COREG) 12.5 MG tablet Take 1.5 tablets (18.75 mg total) by mouth 2 (two) times daily with a meal. 270 tablet 3   cloNIDine (CATAPRES - DOSED IN MG/24 HR) 0.1 mg/24hr patch PLACE 1 PATCH (0.1 MG TOTAL) ONTO THE SKIN  ONCE A WEEK. 12 patch 1   flecainide (TAMBOCOR) 100 MG tablet TAKE 1 TABLET BY MOUTH TWICE A DAY 180 tablet 3   lisinopril (ZESTRIL) 40 MG tablet TAKE 1 TABLET BY MOUTH EVERY DAY 90 tablet 1   ondansetron (ZOFRAN ODT) 8 MG disintegrating tablet Take 1 tablet (8 mg total) by mouth every 8 (eight) hours as needed. 4 tablet 0   pantoprazole (PROTONIX) 40 MG tablet TAKE 1 TABLET BY MOUTH EVERY DAY 30 tablet 0   simvastatin (ZOCOR) 40 MG tablet Take 1 tablet (40 mg total) by mouth daily. 90 tablet 1   SYNTHROID 175 MCG tablet TAKE 1 TABLET BY MOUTH DAILY BEFORE BREAKFAST. 90 tablet 2   tamsulosin (FLOMAX) 0.4 MG CAPS capsule TAKE 2 CAPSULES BY MOUTH EVERY DAY 180 capsule 0   FLUoxetine (PROZAC) 40 MG capsule TAKE 2 CAPSULES BY MOUTH EVERY DAY 180 capsule 0   No facility-administered medications prior to visit.    Allergies  Allergen Reactions   Sulfa Drugs Cross Reactors Hives   Codeine Other (See Comments)    Made him very jittery    Hydrochlorothiazide Other (See Comments)    Hyponatremia    ROS Review of Systems  Constitutional: Negative.   HENT: Negative.    Eyes: Negative.   Respiratory: Negative.    Cardiovascular: Negative.   Gastrointestinal: Negative.   Genitourinary: Negative.   Musculoskeletal: Negative.   Skin: Negative.   Neurological: Negative.   Psychiatric/Behavioral: Negative.    All other systems reviewed and are negative.    Objective:    Physical Exam Constitutional:      General: He is not in acute distress.    Appearance: Normal appearance. He is normal weight. He is not ill-appearing, toxic-appearing or diaphoretic.  Cardiovascular:     Rate and Rhythm: Normal rate and regular rhythm.     Heart sounds: Normal heart sounds. No murmur heard.   No friction rub. No gallop.  Pulmonary:     Effort: Pulmonary effort is normal. No respiratory distress.     Breath sounds: Normal breath sounds. No stridor. No wheezing, rhonchi or rales.  Chest:     Chest  wall: No tenderness.  Neurological:     General: No focal deficit present.     Mental Status: He is alert and  oriented to person, place, and time. Mental status is at baseline.  Psychiatric:        Mood and Affect: Mood normal.        Behavior: Behavior normal.        Thought Content: Thought content normal.        Judgment: Judgment normal.    BP (!) 157/95   Pulse 63   Temp 98 F (36.7 C) (Temporal)   Resp 16   Ht 5\' 9"  (1.753 m)   Wt 204 lb 3.2 oz (92.6 kg)   SpO2 98%   BMI 30.16 kg/m  Wt Readings from Last 3 Encounters:  06/26/21 204 lb 3.2 oz (92.6 kg)  05/22/21 204 lb (92.5 kg)  05/13/21 190 lb (86.2 kg)     Health Maintenance Due  Topic Date Due   COLONOSCOPY (Pts 45-17yrs Insurance coverage will need to be confirmed)  02/13/2019   COVID-19 Vaccine (4 - Booster for [place] series) 01/18/2021    There are no preventive care reminders to display for this patient.  Lab Results  Component Value Date   TSH 0.15 (L) 05/20/2021   Lab Results  Component Value Date   WBC 10.8 (H) 06/26/2021   HGB 15.8 06/26/2021   HCT 47.1 06/26/2021   MCV 89.3 06/26/2021   PLT 211.0 06/26/2021   Lab Results  Component Value Date   NA 136 05/20/2021   K 3.8 05/20/2021   CO2 31 05/20/2021   GLUCOSE 106 (H) 05/20/2021   BUN 6 05/20/2021   CREATININE 0.91 05/20/2021   BILITOT 0.4 05/13/2021   ALKPHOS 67 05/13/2021   AST 11 (L) 05/13/2021   ALT 9 05/13/2021   PROT 6.5 05/13/2021   ALBUMIN 3.7 05/13/2021   CALCIUM 9.0 05/20/2021   ANIONGAP 10 05/13/2021   GFR 90.37 05/20/2021   Lab Results  Component Value Date   CHOL 185 06/26/2021   Lab Results  Component Value Date   HDL 40.40 06/26/2021   Lab Results  Component Value Date   LDLCALC 106 (H) 06/26/2021   Lab Results  Component Value Date   TRIG 190.0 (H) 06/26/2021   Lab Results  Component Value Date   CHOLHDL 5 06/26/2021   Lab Results  Component Value Date   HGBA1C 5.6 06/26/2021      Assessment  & Plan:   Problem List Items Addressed This Visit       Other   Depression with anxiety - Primary   Relevant Medications   FLUoxetine (PROZAC) 20 MG capsule   sertraline (ZOLOFT) 50 MG tablet   Other Visit Diagnoses     Acute bacterial sinusitis       Relevant Medications   amoxicillin-clavulanate (AUGMENTIN) 875-125 MG tablet   azelastine (ASTELIN) 0.1 % nasal spray   Screening for endocrine, metabolic and immunity disorder       Relevant Orders   CBC with Differential/Platelet (Completed)   Hemoglobin A1c (Completed)   Lipid screening       Relevant Orders   Lipid panel (Completed)   Need for shingles vaccine       Relevant Orders   Varicella-zoster vaccine IM (Shingrix) (Completed)       Meds ordered this encounter  Medications   FLUoxetine (PROZAC) 20 MG capsule    Sig: Take 1 capsule (20 mg total) by mouth daily.    Dispense:  7 capsule    Refill:  0    Order Specific Question:   Supervising Provider  Answer:   Carlota Raspberry, JEFFREY R [[id number]]   sertraline (ZOLOFT) 50 MG tablet    Sig: Take 1 tablet (50 mg total) by mouth daily.    Dispense:  90 tablet    Refill:  0    Order Specific Question:   Supervising Provider    Answer:   Carlota Raspberry, JEFFREY R [[id number]]   amoxicillin-clavulanate (AUGMENTIN) 875-125 MG tablet    Sig: Take 1 tablet by mouth 2 (two) times daily.    Dispense:  20 tablet    Refill:  0    Order Specific Question:   Supervising Provider    Answer:   Carlota Raspberry, JEFFREY R [[id number]]   azelastine (ASTELIN) 0.1 % nasal spray    Sig: Place 1 spray into both nostrils 2 (two) times daily. Use in each nostril as directed    Dispense:  30 mL    Refill:  12    Order Specific Question:   Supervising Provider    Answer:   Carlota Raspberry, JEFFREY R [[id number]]    Follow-up: Return in about 10 weeks (around 09/04/2021).   PLAN Half prozac dose for one week, then take wash out week. Start sertraline 50mg  po qd afer that. Increase to 100mg  after 2-3 weeks if tolerated well. Med  check in 8-10 weeks. He will contact office with breakthrough symptoms - can consider hydroxyzine or temporary benzo. Seems as upper respiratory infection. Treat as above with augmentin and prednisone. Return if worsening or failing to improve. Labs collected. Will follow up with the patient as warranted. Shingles shot 1 given. Will give second shot between 2-52mo from now Patient encouraged to call clinic with any questions, comments, or concerns.  Maximiano Coss, NP

## 2021-07-05 ENCOUNTER — Other Ambulatory Visit: Payer: Self-pay | Admitting: Registered Nurse

## 2021-07-18 ENCOUNTER — Other Ambulatory Visit: Payer: Self-pay | Admitting: Registered Nurse

## 2021-07-18 DIAGNOSIS — R109 Unspecified abdominal pain: Secondary | ICD-10-CM

## 2021-08-09 ENCOUNTER — Other Ambulatory Visit: Payer: Self-pay | Admitting: Registered Nurse

## 2021-08-09 ENCOUNTER — Other Ambulatory Visit: Payer: Self-pay | Admitting: Endocrinology

## 2021-08-09 DIAGNOSIS — J449 Chronic obstructive pulmonary disease, unspecified: Secondary | ICD-10-CM

## 2021-08-09 DIAGNOSIS — F418 Other specified anxiety disorders: Secondary | ICD-10-CM

## 2021-08-13 ENCOUNTER — Other Ambulatory Visit: Payer: Self-pay

## 2021-08-13 ENCOUNTER — Ambulatory Visit: Payer: 59 | Admitting: Registered Nurse

## 2021-08-13 ENCOUNTER — Encounter: Payer: Self-pay | Admitting: Registered Nurse

## 2021-08-13 VITALS — BP 163/87 | HR 62 | Temp 98.2°F | Resp 18 | Ht 69.0 in | Wt 207.8 lb

## 2021-08-13 DIAGNOSIS — R6889 Other general symptoms and signs: Secondary | ICD-10-CM

## 2021-08-13 DIAGNOSIS — Z20828 Contact with and (suspected) exposure to other viral communicable diseases: Secondary | ICD-10-CM | POA: Diagnosis not present

## 2021-08-13 LAB — POCT INFLUENZA A/B
Influenza A, POC: NEGATIVE
Influenza B, POC: NEGATIVE

## 2021-08-13 MED ORDER — OSELTAMIVIR PHOSPHATE 75 MG PO CAPS: 75.0000 mg | ORAL_CAPSULE | Freq: Two times a day (BID) | ORAL | 0 refills | Status: DC

## 2021-08-13 MED ORDER — DM-GUAIFENESIN ER 30-600 MG PO TB12: 1.0000 | ORAL_TABLET | Freq: Two times a day (BID) | ORAL | 0 refills | Status: DC

## 2021-08-13 MED ORDER — BENZONATATE 200 MG PO CAPS: 200.0000 mg | ORAL_CAPSULE | Freq: Two times a day (BID) | ORAL | 0 refills | Status: DC | PRN

## 2021-08-13 NOTE — Progress Notes (Signed)
Established Patient Office Visit  Subjective:  Patient ID: Jesse WYKA [name], male    DOB: 05-01-1961  Age: 60 y.o. MRN: [id number]  CC:  Chief Complaint  Patient presents with   Cough    Patient states he has been sick for about 4-5 days and has been around people who tested positive for the FLU. Head congestion , sore throat , cough, and night sweats. He has took some Mucinex with no relief.    HPI Jesse REZA [name] presents for flu like symptoms  Onset 4-5 days ago.  Has had multiple sick contacts with influenza.  Head congestion, sore throat, cough, night sweats.  Taking OTC mucinex without relief, taking advil and tylenol with some relief for headache. Azelastine has helped somewhat with nasal congestion.   No shob, doe, chest pain, headaches, visual changes, nvd  No covid exposure.  Past Medical History:  Diagnosis Date   Allergy    Anxiety    Atrial fibrillation ([place])    Bell palsy    Depression    Diastolic heart failure    Hypercholesterolemia    Hypertension    Pneumonia    Thyroid disease     Past Surgical History:  Procedure Laterality Date   CHOLECYSTECTOMY     HERNIA REPAIR     KNEE SURGERY     VASECTOMY      Family History  Problem Relation Age of Onset   Heart disease Mother    Heart attack Mother    Other Mother        thyroid problems   Kidney cancer Mother    Cirrhosis Father    Other Father        thyroid problems   Alcohol abuse Father    Hypertension Father    Mental illness Brother    Parkinsonism Brother     Social History   Socioeconomic History   Marital status: Married    Spouse name: Not on file   Number of children: 1   Years of education: Not on file   Highest education level: Not on file  Occupational History   Not on file  Tobacco Use   Smoking status: Every Day    Packs/day: 0.50    Years: 40.00    Pack years: 20.00    Types: Cigarettes    Start date: 09/14/1978   Smokeless tobacco: Never  Vaping Use    Vaping Use: Never used  Substance and Sexual Activity   Alcohol use: Yes    Alcohol/week: 0.0 standard drinks    Comment: rarely   Drug use: No   Sexual activity: Never    Birth control/protection: None  Other Topics Concern   Not on file  Social History Narrative   Marital status: married x 30+ years      Children:  2 children;(38,37); 3+3 grandchild      Lives:  With wife, son      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 10 mL 0   albuterol (PROVENTIL) (2.5 MG/3ML) 0.083% nebulizer solution Take 3 mLs (2.5 mg total) by nebulization every 6 (six) hours as needed for wheezing or shortness of breath. 150 mL 1   albuterol (VENTOLIN HFA) 108 (90 Base) MCG/ACT inhaler INHALE 2 PUFFS INTO THE LUNGS EVERY 6 HOURS AS NEEDED FOR WHEEZING/SHORTNESS OF BREATH 8.5 each 2   ALPRAZolam (XANAX) 1 MG tablet Take 0.5-1 tablets (0.5-1 mg total) by mouth at bedtime as needed for anxiety. 90 tablet 0   aspirin 81 MG tablet Take 81 mg by mouth every other day.     azelastine (ASTELIN) 0.1 % nasal spray Place 1 spray into both nostrils 2 (two) times daily. Use in each nostril as directed 30 mL 12   carvedilol (COREG) 12.5 MG tablet Take 1.5 tablets (18.75 mg total) by mouth 2 (two) times daily with a meal. 270 tablet 3   cloNIDine (CATAPRES - DOSED IN MG/24 HR) 0.1 mg/24hr patch PLACE 1 PATCH (0.1 MG TOTAL) ONTO THE SKIN ONCE A WEEK. 12  patch 1   flecainide (TAMBOCOR) 100 MG tablet TAKE 1 TABLET BY MOUTH TWICE A DAY 180 tablet 3   FLUoxetine (PROZAC) 20 MG capsule Take 1 capsule (20 mg total) by mouth daily. 7 capsule 0   FLUoxetine (PROZAC) 40 MG capsule TAKE 2 CAPSULES BY MOUTH EVERY DAY 180 capsule 0   lisinopril (ZESTRIL) 40 MG tablet TAKE 1 TABLET BY MOUTH EVERY DAY 90 tablet 1   ondansetron (ZOFRAN ODT) 8 MG disintegrating tablet Take 1 tablet (8 mg total) by mouth every 8 (eight) hours as needed. 4 tablet 0   pantoprazole (PROTONIX) 40 MG tablet TAKE 1 TABLET BY MOUTH EVERY DAY 30 tablet 0   sertraline (ZOLOFT) 50 MG tablet TAKE 1 TABLET BY MOUTH EVERY DAY 90 tablet 0   simvastatin (ZOCOR) 40 MG tablet Take 1 tablet (40 mg total) by mouth daily. 90 tablet 1   SYNTHROID 175 MCG tablet TAKE 1 TABLET BY MOUTH DAILY BEFORE BREAKFAST. 90 tablet 2   tamsulosin (FLOMAX) 0.4 MG CAPS capsule TAKE 2 CAPSULES BY MOUTH EVERY DAY 180 capsule 0   amoxicillin-clavulanate (AUGMENTIN) 875-125 MG tablet Take 1 tablet by mouth 2 (two) times daily. 20 tablet 0   No facility-administered medications prior to visit.    Allergies  Allergen Reactions   Sulfa Drugs Cross Reactors Hives   Codeine Other (See Comments)    Made him very jittery    Hydrochlorothiazide Other (See Comments)    Hyponatremia    ROS Review of Systems  Constitutional:  Positive for fatigue and fever.  HENT: Negative.    Eyes: Negative.   Respiratory: Negative.    Cardiovascular: Negative.   Gastrointestinal: Negative.   Endocrine: Negative.   Genitourinary: Negative.   Musculoskeletal:  Positive for myalgias. Negative for arthralgias, back pain, gait problem, joint swelling, neck pain and neck stiffness.  Skin: Negative.   Allergic/Immunologic: Negative.   Neurological: Negative.   Hematological: Negative.   Psychiatric/Behavioral: Negative.    All other systems reviewed and are negative.    Objective:    Physical Exam Constitutional:       General: He is not in acute distress.    Appearance: Normal appearance. He is normal weight. He is not ill-appearing, toxic-appearing or diaphoretic.  Neck:  Vascular: No carotid bruit.  Cardiovascular:     Rate and Rhythm: Normal rate and regular rhythm.     Heart sounds: Normal heart sounds. No murmur heard.   No friction rub. No gallop.  Pulmonary:     Effort: Pulmonary effort is normal. No respiratory distress.     Breath sounds: Normal breath sounds. No stridor. No wheezing, rhonchi or rales.  Chest:     Chest wall: No tenderness.  Musculoskeletal:     Cervical back: Normal range of motion and neck supple. Tenderness present. No rigidity.  Lymphadenopathy:     Cervical: Cervical adenopathy present.  Skin:    General: Skin is warm and dry.     Capillary Refill: Capillary refill takes less than 2 seconds.  Neurological:     General: No focal deficit present.     Mental Status: He is alert and oriented to person, place, and time. Mental status is at baseline.  Psychiatric:        Mood and Affect: Mood normal.        Behavior: Behavior normal.        Thought Content: Thought content normal.        Judgment: Judgment normal.    BP (!) 163/87   Pulse 62   Temp 98.2 F (36.8 C) (Temporal)   Resp 18   Ht 5\' 9"  (1.753 m)   Wt 207 lb 12.8 oz (94.3 kg)   SpO2 99%   BMI 30.69 kg/m  Wt Readings from Last 3 Encounters:  08/13/21 207 lb 12.8 oz (94.3 kg)  06/26/21 204 lb 3.2 oz (92.6 kg)  05/22/21 204 lb (92.5 kg)     Health Maintenance Due  Topic Date Due   Pneumococcal Vaccine 10-53 Years old (1 - PCV) Never done   COLONOSCOPY (Pts 45-60yrs Insurance coverage will need to be confirmed)  02/13/2019   COVID-19 Vaccine (4 - Booster for [place] series) 11/15/2020    There are no preventive care reminders to display for this patient.  Lab Results  Component Value Date   TSH 0.15 (L) 05/20/2021   Lab Results  Component Value Date   WBC 10.8 (H) 06/26/2021   HGB 15.8  06/26/2021   HCT 47.1 06/26/2021   MCV 89.3 06/26/2021   PLT 211.0 06/26/2021   Lab Results  Component Value Date   NA 136 05/20/2021   K 3.8 05/20/2021   CO2 31 05/20/2021   GLUCOSE 106 (H) 05/20/2021   BUN 6 05/20/2021   CREATININE 0.91 05/20/2021   BILITOT 0.4 05/13/2021   ALKPHOS 67 05/13/2021   AST 11 (L) 05/13/2021   ALT 9 05/13/2021   PROT 6.5 05/13/2021   ALBUMIN 3.7 05/13/2021   CALCIUM 9.0 05/20/2021   ANIONGAP 10 05/13/2021   GFR 90.37 05/20/2021   Lab Results  Component Value Date   CHOL 185 06/26/2021   Lab Results  Component Value Date   HDL 40.40 06/26/2021   Lab Results  Component Value Date   LDLCALC 106 (H) 06/26/2021   Lab Results  Component Value Date   TRIG 190.0 (H) 06/26/2021   Lab Results  Component Value Date   CHOLHDL 5 06/26/2021   Lab Results  Component Value Date   HGBA1C 5.6 06/26/2021      Assessment & Plan:   Problem List Items Addressed This Visit   None Visit Diagnoses     Exposure to influenza    -  Primary   Relevant Medications   oseltamivir (  TAMIFLU) 75 MG capsule   Other Relevant Orders   POCT Influenza A/B (Completed)   Flu-like symptoms       Relevant Medications   oseltamivir (TAMIFLU) 75 MG capsule   benzonatate (TESSALON) 200 MG capsule   dextromethorphan-guaiFENesin (MUCINEX DM) 30-600 MG 12hr tablet       Meds ordered this encounter  Medications   oseltamivir (TAMIFLU) 75 MG capsule    Sig: Take 1 capsule (75 mg total) by mouth 2 (two) times daily.    Dispense:  10 capsule    Refill:  0    Order Specific Question:   Supervising Provider    Answer:   Carlota Raspberry, JEFFREY R [[id number]]   benzonatate (TESSALON) 200 MG capsule    Sig: Take 1 capsule (200 mg total) by mouth 2 (two) times daily as needed for cough.    Dispense:  20 capsule    Refill:  0    Order Specific Question:   Supervising Provider    Answer:   Carlota Raspberry, JEFFREY R [[id number]]   dextromethorphan-guaiFENesin (MUCINEX DM) 30-600 MG 12hr tablet     Sig: Take 1 tablet by mouth 2 (two) times daily.    Dispense:  20 tablet    Refill:  0    Order Specific Question:   Supervising Provider    Answer:   Carlota Raspberry, JEFFREY R [[id number]]    Follow-up: Return if symptoms worsen or fail to improve.   PLAN Continue azelastine and tylenol. Recommend tylenol 1000mg  po tid. Ibuprofen ok for 600mg  po q6h with food.  Start mucinex dm twice daily prn. Ok to use tessalon for cough relief. Tamiflu given. Despite negative flu testing on site, given his close exposure and flu like symptoms, will pursue this course.  Return if worsening or failing to improve. Can consider abx if worsening in next 48 hours.  Patient encouraged to call clinic with any questions, comments, or concerns.  Maximiano Coss, NP

## 2021-08-13 NOTE — Patient Instructions (Addendum)
Mr. Vo -   Pleasure to see you  Flu test came back negative but given your close exposure to multiple people with the flu, I'll have you start tamiflu twice daily for five days  I have sent mucinex-dm, which should do a little more than the typical OTC mucinex. I have sent tessalon for cough relief.  I recommend saline sinus rinse (available otc) for congestion relief.  Get rest and stay hydrated - try to drink somewhere close to a gallon of water daily.   If things aren't starting to get better by Friday, give me a call  Thank you  Rich     If you have lab work done today you will be contacted with your lab results within the next 2 weeks.  If you have not heard from [place] then please contact us. The fastest way to get your results is to register for My Chart.   IF you received an x-ray today, you will receive an invoice from [facility]. Please contact [facility] at [phone] with questions or concerns regarding your invoice.   IF you received labwork today, you will receive an invoice from [place]. Please contact [facility] at [phone] with questions or concerns regarding your invoice.   Our billing staff will not be able to assist you with questions regarding bills from these companies.  You will be contacted with the lab results as soon as they are available. The fastest way to get your results is to activate your My Chart account. Instructions are located on the last page of this paperwork. If you have not heard from [place] regarding the results in 2 weeks, please contact this office.

## 2021-08-16 ENCOUNTER — Other Ambulatory Visit: Payer: Self-pay | Admitting: Registered Nurse

## 2021-08-16 DIAGNOSIS — R109 Unspecified abdominal pain: Secondary | ICD-10-CM

## 2021-08-19 ENCOUNTER — Other Ambulatory Visit (INDEPENDENT_AMBULATORY_CARE_PROVIDER_SITE_OTHER): Payer: 59

## 2021-08-19 ENCOUNTER — Other Ambulatory Visit: Payer: Self-pay

## 2021-08-19 DIAGNOSIS — I10 Essential (primary) hypertension: Secondary | ICD-10-CM

## 2021-08-19 DIAGNOSIS — E890 Postprocedural hypothyroidism: Secondary | ICD-10-CM | POA: Diagnosis not present

## 2021-08-19 LAB — BASIC METABOLIC PANEL
BUN: 9 mg/dL (ref 6–23)
CO2: 29 mEq/L (ref 19–32)
Calcium: 9 mg/dL (ref 8.4–10.5)
Chloride: 99 mEq/L (ref 96–112)
Creatinine, Ser: 0.88 mg/dL (ref 0.40–1.50)
GFR: 92.04 mL/min (ref >60.00)
Glucose, Bld: 100 mg/dL — ABNORMAL HIGH (ref 70–99)
Potassium: 3.9 mEq/L (ref 3.5–5.1)
Sodium: 135 mEq/L (ref 135–145)

## 2021-08-19 LAB — T4, FREE: Free T4: 1.41 ng/dL (ref 0.60–1.60)

## 2021-08-19 LAB — TSH: TSH: 0.02 u[IU]/mL — ABNORMAL LOW (ref 0.35–5.50)

## 2021-08-21 ENCOUNTER — Other Ambulatory Visit: Payer: Self-pay

## 2021-08-21 ENCOUNTER — Ambulatory Visit: Payer: 59 | Admitting: Endocrinology

## 2021-08-21 ENCOUNTER — Encounter: Payer: Self-pay | Admitting: Endocrinology

## 2021-08-21 VITALS — BP 135/85 | HR 63 | Ht 69.0 in | Wt 204.8 lb

## 2021-08-21 DIAGNOSIS — E871 Hypo-osmolality and hyponatremia: Secondary | ICD-10-CM

## 2021-08-21 DIAGNOSIS — I10 Essential (primary) hypertension: Secondary | ICD-10-CM | POA: Diagnosis not present

## 2021-08-21 DIAGNOSIS — E890 Postprocedural hypothyroidism: Secondary | ICD-10-CM

## 2021-08-21 NOTE — Patient Instructions (Addendum)
Take 175 Synthroid M-Fri, none on Saturday and 1/2 on Sunday'  150 dose is 6 1/2 per week

## 2021-08-21 NOTE — Progress Notes (Signed)
Patient ID: Jesse Europe [name], male   DOB: 05-25-1961, 60 y.o.   MRN: [id number]   Reason for Appointment: Follow-up    History of Present Illness:   The hypothyroidism was first diagnosed in 11/13. Previously had I-131 treatment for Graves' disease He was initially started with 88 mcg but his dose needed to be increased progressively  He has been treated with brand name SYNTHROID, the dose has been adjusted previously based on his labs  He is taking 175 mcg brand-name Synthroid, 6-1/2 tablets a week  His dose was decreased in 9/22 even though previously had required an increase in 12/21   He does not think he has any unusual fatigue now Weight is about the same Also no feelings of shakiness, anxiety or palpitations  TSH is now lower than before, free T4 relatively higher also  He has been able to continue on the brand name Synthroid  He takes this every morning before breakfast   Wt Readings from Last 3 Encounters:  08/21/21 204 lb 12.8 oz (92.9 kg)  08/13/21 207 lb 12.8 oz (94.3 kg)  06/26/21 204 lb 3.2 oz (92.6 kg)    LABS:   Lab Results  Component Value Date   TSH 0.02 (L) 08/19/2021   TSH 0.15 (L) 05/20/2021   TSH 0.78 11/06/2020   FREET4 1.41 08/19/2021   FREET4 1.21 05/20/2021   FREET4 1.30 11/06/2020      HYPERTENSION:  He has had long-standing hypertension which is  managed with lisinopril 40, Catapres 0.1 mg patch and Coreg 1-1/2 tablets twice a day He was told to stop hydrochlorothiazide which was causing hyponatremia  He was evaluated for secondary hypertension and reports of his aldosterone/renin indicate normal levels Also was supposed to get the renal artery ultrasound but this was not done  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 However he thinks his blood pressure is usually 580-998 systolic and 70 or lower diastolic He has not missed any doses of his medications and changes his clonidine  patch on Wednesday now but not clear if he is doing this consistently  Blood pressure is again appearing to be higher in the office, he did change his clonidine patch yesterday apparently  BP Readings from Last 3 Encounters:  08/21/21 135/85  08/13/21 (!) 163/87  06/26/21 (!) 157/95       Allergies as of 08/21/2021       Reactions   Sulfa Drugs Cross Reactors Hives   Codeine Other (See Comments)   Made him very jittery    Hydrochlorothiazide Other (See Comments)   Hyponatremia        Medication List        Accurate as of August 21, 2021  1:01 PM. If you have any questions, ask your nurse or doctor.          acetic acid-hydrocortisone OTIC solution Commonly known as: VOSOL-HC Place 3 drops into both ears 2 (two) times daily.   albuterol (2.5 MG/3ML) 0.083% nebulizer solution Commonly known as: PROVENTIL Take 3 mLs (2.5 mg total) by nebulization every 6 (six) hours as needed for wheezing or shortness of breath.   albuterol 108 (90 Base) MCG/ACT inhaler Commonly known as: VENTOLIN HFA INHALE 2 PUFFS INTO THE LUNGS EVERY 6 HOURS AS NEEDED FOR WHEEZING/SHORTNESS OF BREATH   ALPRAZolam 1 MG tablet Commonly known as: XANAX Take 0.5-1 tablets (0.5-1 mg total) by mouth at bedtime as needed for anxiety.  aspirin 81 MG tablet Take 81 mg by mouth every other day.   azelastine 0.1 % nasal spray Commonly known as: ASTELIN Place 1 spray into both nostrils 2 (two) times daily. Use in each nostril as directed   benzonatate 200 MG capsule Commonly known as: TESSALON Take 1 capsule (200 mg total) by mouth 2 (two) times daily as needed for cough.   carvedilol 12.5 MG tablet Commonly known as: COREG Take 1.5 tablets (18.75 mg total) by mouth 2 (two) times daily with a meal.   cloNIDine 0.1 mg/24hr patch Commonly known as: CATAPRES - Dosed in mg/24 hr PLACE 1 PATCH (0.1 MG TOTAL) ONTO THE SKIN ONCE A WEEK.   dextromethorphan-guaiFENesin 30-600 MG 12hr tablet Commonly  known as: MUCINEX DM Take 1 tablet by mouth 2 (two) times daily.   flecainide 100 MG tablet Commonly known as: TAMBOCOR TAKE 1 TABLET BY MOUTH TWICE A DAY   FLUoxetine 20 MG capsule Commonly known as: PROZAC Take 1 capsule (20 mg total) by mouth daily.   FLUoxetine 40 MG capsule Commonly known as: PROZAC TAKE 2 CAPSULES BY MOUTH EVERY DAY   lisinopril 40 MG tablet Commonly known as: ZESTRIL TAKE 1 TABLET BY MOUTH EVERY DAY   ondansetron 8 MG disintegrating tablet Commonly known as: Zofran ODT Take 1 tablet (8 mg total) by mouth every 8 (eight) hours as needed.   oseltamivir 75 MG capsule Commonly known as: Tamiflu Take 1 capsule (75 mg total) by mouth 2 (two) times daily.   pantoprazole 40 MG tablet Commonly known as: PROTONIX TAKE 1 TABLET BY MOUTH EVERY DAY   sertraline 50 MG tablet Commonly known as: ZOLOFT TAKE 1 TABLET BY MOUTH EVERY DAY   simvastatin 40 MG tablet Commonly known as: ZOCOR Take 1 tablet (40 mg total) by mouth daily.   Synthroid 175 MCG tablet Generic drug: levothyroxine TAKE 1 TABLET BY MOUTH DAILY BEFORE BREAKFAST.   tamsulosin 0.4 MG Caps capsule Commonly known as: FLOMAX TAKE 2 CAPSULES BY MOUTH EVERY DAY        Past Medical History:  Diagnosis Date   Allergy    Anxiety    Atrial fibrillation (HCC)    Bell palsy    Depression    Diastolic heart failure    Hypercholesterolemia    Hypertension    Pneumonia    Thyroid disease     Past Surgical History:  Procedure Laterality Date   CHOLECYSTECTOMY     HERNIA REPAIR     KNEE SURGERY     VASECTOMY      Family History  Problem Relation Age of Onset   Heart disease Mother    Heart attack Mother    Other Mother        thyroid problems   Kidney cancer Mother    Cirrhosis Father    Other Father        thyroid problems   Alcohol abuse Father    Hypertension Father    Mental illness Brother    Parkinsonism Brother     Social History:  reports that he has been smoking  cigarettes. He started smoking about 42 years ago. He has a 20.00 pack-year smoking history. He has never used smokeless tobacco. He reports current alcohol use. He reports that he does not use drugs.  Allergies:  Allergies  Allergen Reactions   Sulfa Drugs Cross Reactors Hives   Codeine Other (See Comments)    Made him very jittery    Hydrochlorothiazide Other (See Comments)  Hyponatremia    REVIEW of systems:   HYPONATREMIA: His sodium had been better with stopping HCTZ  However it tends to be below normal and has been as low as 130 recently  He was told to cut back on [place]  He has likely reduced his overall fluid intake as sodium is now consistently normal Probably has mild SIADH  He still taking 80 mg of Prozac from his PCP  CT scan of his chest did not show any tumor  Lab Results  Component Value Date   NA 135 08/19/2021   K 3.9 08/19/2021   CL 99 08/19/2021   CO2 29 08/19/2021      Examination:   BP 135/85   Pulse 63   Ht 5\' 9"  (1.753 m)   Wt 204 lb 12.8 oz (92.9 kg)   SpO2 97%   BMI 30.24 kg/m   No tremor   biceps reflexes show normal relaxation    Assessments    HYPERTENSION:   His blood pressure today is mildly increased but Likely has whitecoat syndrome as blood pressure is decreasing significantly after several minutes However better controlled at home, still has not brought his monitor for comparison from home but he thinks he is using an [employer] monitor  He will continue his same drug regimen Needs to make a note on his calendar to change his clonidine patch on Wednesdays consistently  HYPOTHYROIDISM:  secondary to I-131 treatment  His TSH is now suppressed even though he is taking 1/2 tablet less per week compared to his last visit  He will now go down to taking 5-1/2 tablets a week When he switches to 150 mcg he will take 6-1/2 tablets a week, written instructions given Needs labs in 6 weeks or so  Hyponatremia: Sodium is  back to normal with fluid restriction, likely has mild SIADH from Prozac  Patient Instructions  Take 175 Synthroid M-Fri, none on Saturday and 1/2 on Sunday'  150 dose is 6 1/2 per week   [name] 08/21/2021, 1:01 PM

## 2021-08-22 ENCOUNTER — Encounter: Payer: Self-pay | Admitting: Registered Nurse

## 2021-08-22 ENCOUNTER — Ambulatory Visit: Payer: 59 | Admitting: Registered Nurse

## 2021-08-22 ENCOUNTER — Other Ambulatory Visit: Payer: Self-pay

## 2021-08-22 VITALS — BP 161/89 | HR 61 | Temp 98.2°F | Resp 18 | Ht 69.0 in | Wt 204.8 lb

## 2021-08-22 DIAGNOSIS — F418 Other specified anxiety disorders: Secondary | ICD-10-CM | POA: Diagnosis not present

## 2021-08-22 DIAGNOSIS — Z23 Encounter for immunization: Secondary | ICD-10-CM

## 2021-08-22 DIAGNOSIS — B9689 Other specified bacterial agents as the cause of diseases classified elsewhere: Secondary | ICD-10-CM | POA: Diagnosis not present

## 2021-08-22 DIAGNOSIS — J019 Acute sinusitis, unspecified: Secondary | ICD-10-CM

## 2021-08-22 MED ORDER — SERTRALINE HCL 50 MG PO TABS: 50.0000 mg | ORAL_TABLET | Freq: Every day | ORAL | 1 refills | Status: DC

## 2021-08-22 MED ORDER — AMOXICILLIN-POT CLAVULANATE 875-125 MG PO TABS
1.0000 | ORAL_TABLET | Freq: Two times a day (BID) | ORAL | 0 refills | Status: DC
Start: 2021-08-22 — End: 2022-01-06

## 2021-08-22 NOTE — Progress Notes (Signed)
Established Patient Office Visit  Subjective:  Patient ID: Jesse BANKS [name], male    DOB: April 20, 1961  Age: 60 y.o. MRN: [id number]  CC:  Chief Complaint  Patient presents with   Follow-up    Patient states he is here for a follow up. Patient states he is still having some congestion.    HPI Jesse KUYPER [name] presents for follow up and congestion  Follow up  At visit on 06/26/21 we cut down on fluoxetine and started sertraline Has been taking as prescribed. Started later than anticipated due to acute illness. Doing well since. Feeling better. Plans to continue.   Congestion Flu exposure on 08/13/21 with flu like symptoms. Congestion ongoing. Frontal sinus pressure Has overall improved since last visit. Continues mucinex - still some coughing, but minimal.  Past Medical History:  Diagnosis Date   Allergy    Anxiety    Atrial fibrillation ([place])    Bell palsy    Depression    Diastolic heart failure    Hypercholesterolemia    Hypertension    Pneumonia    Thyroid disease     Past Surgical History:  Procedure Laterality Date   CHOLECYSTECTOMY     HERNIA REPAIR     KNEE SURGERY     VASECTOMY      Family History  Problem Relation Age of Onset   Heart disease Mother    Heart attack Mother    Other Mother        thyroid problems   Kidney cancer Mother    Cirrhosis Father    Other Father        thyroid problems   Alcohol abuse Father    Hypertension Father    Mental illness Brother    Parkinsonism Brother     Social History   Socioeconomic History   Marital status: Married    Spouse name: Not on file   Number of children: 1   Years of education: Not on file   Highest education level: Not on file  Occupational History   Not on file  Tobacco Use   Smoking status: Every Day    Packs/day: 0.50    Years: 40.00    Pack years: 20.00    Types: Cigarettes    Start date: 09/14/1978   Smokeless tobacco: Never  Vaping Use   Vaping Use: Never used   Substance and Sexual Activity   Alcohol use: Yes    Alcohol/week: 0.0 standard drinks    Comment: rarely   Drug use: No   Sexual activity: Never    Birth control/protection: None  Other Topics Concern   Not on file  Social History Narrative   Marital status: married x 30+ years      Children:  2 children;(38,37); 3+3 grandchild      Lives:  With wife, son      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 10 mL 0   albuterol (PROVENTIL) (2.5 MG/3ML) 0.083% nebulizer solution Take 3 mLs (2.5 mg total) by nebulization every 6 (six) hours as needed for wheezing or shortness of breath. 150 mL 1   albuterol (VENTOLIN HFA) 108 (90 Base) MCG/ACT inhaler INHALE 2 PUFFS INTO THE LUNGS EVERY 6 HOURS AS NEEDED FOR WHEEZING/SHORTNESS OF BREATH 8.5 each 2   ALPRAZolam (XANAX) 1 MG tablet Take 0.5-1 tablets (0.5-1 mg total) by mouth at bedtime as needed for anxiety. 90 tablet 0   aspirin 81 MG tablet Take 81 mg by mouth every other day.     azelastine (ASTELIN) 0.1 % nasal spray Place 1 spray into both nostrils 2 (two) times daily. Use in each nostril as directed 30 mL 12   benzonatate (TESSALON) 200 MG capsule Take 1 capsule (200 mg total) by mouth 2 (two) times daily as needed for cough. 20 capsule 0   carvedilol (COREG) 12.5 MG tablet Take 1.5 tablets (18.75 mg total) by mouth 2 (two) times daily with a meal. 270 tablet 3   cloNIDine  (CATAPRES - DOSED IN MG/24 HR) 0.1 mg/24hr patch PLACE 1 PATCH (0.1 MG TOTAL) ONTO THE SKIN ONCE A WEEK. 12 patch 1   dextromethorphan-guaiFENesin (MUCINEX DM) 30-600 MG 12hr tablet Take 1 tablet by mouth 2 (two) times daily. 20 tablet 0   flecainide (TAMBOCOR) 100 MG tablet TAKE 1 TABLET BY MOUTH TWICE A DAY 180 tablet 3   FLUoxetine (PROZAC) 20 MG capsule Take 1 capsule (20 mg total) by mouth daily. 7 capsule 0   FLUoxetine (PROZAC) 40 MG capsule TAKE 2 CAPSULES BY MOUTH EVERY DAY 180 capsule 0   lisinopril (ZESTRIL) 40 MG tablet TAKE 1 TABLET BY MOUTH EVERY DAY 90 tablet 1   ondansetron (ZOFRAN ODT) 8 MG disintegrating tablet Take 1 tablet (8 mg total) by mouth every 8 (eight) hours as needed. 4 tablet 0   oseltamivir (TAMIFLU) 75 MG capsule Take 1 capsule (75 mg total) by mouth 2 (two) times daily. 10 capsule 0   pantoprazole (PROTONIX) 40 MG tablet TAKE 1 TABLET BY MOUTH EVERY DAY 30 tablet 0   simvastatin (ZOCOR) 40 MG tablet Take 1 tablet (40 mg total) by mouth daily. 90 tablet 1   SYNTHROID 175 MCG tablet TAKE 1 TABLET BY MOUTH DAILY BEFORE BREAKFAST. 90 tablet 2   tamsulosin (FLOMAX) 0.4 MG CAPS capsule TAKE 2 CAPSULES BY MOUTH EVERY DAY 180 capsule 0   sertraline (ZOLOFT) 50 MG tablet TAKE 1 TABLET BY MOUTH EVERY DAY 90 tablet 0   No facility-administered medications prior to visit.    Allergies  Allergen Reactions   Sulfa Drugs Cross Reactors Hives   Codeine Other (See Comments)    Made him very jittery    Hydrochlorothiazide Other (See Comments)    Hyponatremia    ROS Review of Systems  Constitutional: Negative.   HENT:  Positive for congestion and sinus pressure. Negative for dental problem, drooling, ear discharge, facial swelling, mouth sores, sneezing, sore throat, tinnitus, trouble swallowing and voice change.   Eyes: Negative.   Respiratory: Negative.    Cardiovascular: Negative.   Gastrointestinal: Negative.   Genitourinary: Negative.   Musculoskeletal: Negative.    Skin: Negative.   Neurological: Negative.   Psychiatric/Behavioral: Negative.    All other systems reviewed and are negative.    Objective:    Physical Exam Constitutional:  General: He is not in acute distress.    Appearance: Normal appearance. He is normal weight. He is not ill-appearing, toxic-appearing or diaphoretic.  Cardiovascular:     Rate and Rhythm: Normal rate and regular rhythm.     Heart sounds: Normal heart sounds. No murmur heard.   No friction rub. No gallop.  Pulmonary:     Effort: Pulmonary effort is normal. No respiratory distress.     Breath sounds: Normal breath sounds. No stridor. No wheezing, rhonchi or rales.  Chest:     Chest wall: No tenderness.  Neurological:     General: No focal deficit present.     Mental Status: He is alert and oriented to person, place, and time. Mental status is at baseline.  Psychiatric:        Mood and Affect: Mood normal.        Behavior: Behavior normal.        Thought Content: Thought content normal.        Judgment: Judgment normal.    BP (!) 161/89   Pulse 61   Temp 98.2 F (36.8 C) (Temporal)   Resp 18   Ht 5\' 9"  (1.753 m)   Wt 204 lb 12.8 oz (92.9 kg)   SpO2 99%   BMI 30.24 kg/m  Wt Readings from Last 3 Encounters:  08/22/21 204 lb 12.8 oz (92.9 kg)  08/21/21 204 lb 12.8 oz (92.9 kg)  08/13/21 207 lb 12.8 oz (94.3 kg)     Health Maintenance Due  Topic Date Due   Pneumococcal Vaccine 53-52 Years old (1 - PCV) Never done   HIV Screening  Never done   COLONOSCOPY (Pts 45-76yrs Insurance coverage will need to be confirmed)  02/13/2019   COVID-19 Vaccine (4 - Booster for Pfizer series) 11/15/2020   Zoster Vaccines- Shingrix (2 of 2) 08/21/2021    There are no preventive care reminders to display for this patient.  Lab Results  Component Value Date   TSH 0.02 (L) 08/19/2021   Lab Results  Component Value Date   WBC 10.8 (H) 06/26/2021   HGB 15.8 06/26/2021   HCT 47.1 06/26/2021   MCV 89.3  06/26/2021   PLT 211.0 06/26/2021   Lab Results  Component Value Date   NA 135 08/19/2021   K 3.9 08/19/2021   CO2 29 08/19/2021   GLUCOSE 100 (H) 08/19/2021   BUN 9 08/19/2021   CREATININE 0.88 08/19/2021   BILITOT 0.4 05/13/2021   ALKPHOS 67 05/13/2021   AST 11 (L) 05/13/2021   ALT 9 05/13/2021   PROT 6.5 05/13/2021   ALBUMIN 3.7 05/13/2021   CALCIUM 9.0 08/19/2021   ANIONGAP 10 05/13/2021   GFR 92.04 08/19/2021   Lab Results  Component Value Date   CHOL 185 06/26/2021   Lab Results  Component Value Date   HDL 40.40 06/26/2021   Lab Results  Component Value Date   LDLCALC 106 (H) 06/26/2021   Lab Results  Component Value Date   TRIG 190.0 (H) 06/26/2021   Lab Results  Component Value Date   CHOLHDL 5 06/26/2021   Lab Results  Component Value Date   HGBA1C 5.6 06/26/2021      Assessment & Plan:   Problem List Items Addressed This Visit       Other   Depression with anxiety   Relevant Medications   sertraline (ZOLOFT) 50 MG tablet   Other Visit Diagnoses     Acute bacterial sinusitis    -  Primary   Relevant Medications   amoxicillin-clavulanate (AUGMENTIN) 875-125 MG tablet   Need for shingles vaccine       Relevant Orders   Varicella-zoster vaccine IM (Shingrix)       Meds ordered this encounter  Medications   amoxicillin-clavulanate (AUGMENTIN) 875-125 MG tablet    Sig: Take 1 tablet by mouth 2 (two) times daily.    Dispense:  20 tablet    Refill:  0    Order Specific Question:   Supervising Provider    Answer:   Carlota Raspberry, JEFFREY R [[id number]]   sertraline (ZOLOFT) 50 MG tablet    Sig: Take 1 tablet (50 mg total) by mouth daily.    Dispense:  90 tablet    Refill:  1    Order Specific Question:   Supervising Provider    Answer:   Carlota Raspberry, JEFFREY R [[id number]]    Follow-up: Return in about 6 months (around 02/20/2022) for CPE and labs.   PLAN Augmentin for sinusitis. Ok to continue mucinex.  Continue sertraline. Med check in 6 mo. Recommend  CPE and labs at that time. Patient encouraged to call clinic with any questions, comments, or concerns.  Maximiano Coss, NP

## 2021-08-22 NOTE — Patient Instructions (Addendum)
Jesse Suarez -   Doristine Devoid to see you  Glad that the sertraline is going well. Ok to increase to 100mg  daily if you'd like  Take augmentin - finish entire course even if you're feeling better.  Shingles shot #2 today - may make you feel sore and crummy for a day or two, but should resolve quickly.  See you in 6 months for a physical and lab work at that time.  Thank you  Rich     If you have lab work done today you will be contacted with your lab results within the next 2 weeks.  If you have not heard from [place] then please contact us. The fastest way to get your results is to register for My Chart.   IF you received an x-ray today, you will receive an invoice from [facility]. Please contact [facility] at [phone] with questions or concerns regarding your invoice.   IF you received labwork today, you will receive an invoice from [facility]. Please contact [facility] at [phone] with questions or concerns regarding your invoice.   Our billing staff will not be able to assist you with questions regarding bills from these companies.  You will be contacted with the lab results as soon as they are available. The fastest way to get your results is to activate your My Chart account. Instructions are located on the last page of this paperwork. If you have not heard from [place] regarding the results in 2 weeks, please contact this office.

## 2021-09-09 ENCOUNTER — Other Ambulatory Visit: Payer: Self-pay | Admitting: Internal Medicine

## 2021-09-09 ENCOUNTER — Other Ambulatory Visit: Payer: Self-pay | Admitting: Endocrinology

## 2021-09-09 DIAGNOSIS — I10 Essential (primary) hypertension: Secondary | ICD-10-CM

## 2021-09-13 ENCOUNTER — Other Ambulatory Visit: Payer: Self-pay | Admitting: Registered Nurse

## 2021-09-13 DIAGNOSIS — R109 Unspecified abdominal pain: Secondary | ICD-10-CM

## 2021-09-24 ENCOUNTER — Other Ambulatory Visit: Payer: Self-pay | Admitting: Registered Nurse

## 2021-09-24 DIAGNOSIS — N319 Neuromuscular dysfunction of bladder, unspecified: Secondary | ICD-10-CM

## 2021-09-25 ENCOUNTER — Other Ambulatory Visit: Payer: Self-pay | Admitting: Internal Medicine

## 2021-09-25 DIAGNOSIS — I10 Essential (primary) hypertension: Secondary | ICD-10-CM

## 2021-10-03 ENCOUNTER — Other Ambulatory Visit: Payer: Self-pay

## 2021-10-03 ENCOUNTER — Other Ambulatory Visit (INDEPENDENT_AMBULATORY_CARE_PROVIDER_SITE_OTHER): Payer: 59

## 2021-10-03 DIAGNOSIS — E890 Postprocedural hypothyroidism: Secondary | ICD-10-CM

## 2021-10-03 LAB — T4, FREE: Free T4: 1.33 ng/dL (ref 0.60–1.60)

## 2021-10-03 LAB — TSH: TSH: 0.03 u[IU]/mL — ABNORMAL LOW (ref 0.35–5.50)

## 2021-10-05 ENCOUNTER — Other Ambulatory Visit: Payer: Self-pay | Admitting: Registered Nurse

## 2021-10-05 DIAGNOSIS — E785 Hyperlipidemia, unspecified: Secondary | ICD-10-CM

## 2021-10-06 ENCOUNTER — Other Ambulatory Visit: Payer: Self-pay | Admitting: Registered Nurse

## 2021-10-07 ENCOUNTER — Ambulatory Visit: Payer: 59 | Admitting: Endocrinology

## 2021-10-07 ENCOUNTER — Encounter: Payer: Self-pay | Admitting: Endocrinology

## 2021-10-07 ENCOUNTER — Other Ambulatory Visit: Payer: Self-pay

## 2021-10-07 VITALS — BP 138/94 | HR 55 | Ht 69.0 in | Wt 205.0 lb

## 2021-10-07 DIAGNOSIS — E890 Postprocedural hypothyroidism: Secondary | ICD-10-CM

## 2021-10-07 DIAGNOSIS — I10 Essential (primary) hypertension: Secondary | ICD-10-CM | POA: Diagnosis not present

## 2021-10-07 NOTE — Progress Notes (Signed)
Patient ID: Jesse Europe [name], male   DOB: November 19, 1960, 61 y.o.   MRN: [id number]   Reason for Appointment: Follow-up    History of Present Illness:   The hypothyroidism was first diagnosed in 11/13. Previously had I-131 treatment for Graves' disease He was initially started with 88 mcg but his dose needed to be increased progressively  He has been treated with brand name SYNTHROID, the dose has been adjusted previously based on his labs  He is taking 175 mcg brand-name Synthroid, 5-1/2 tablets a week He skips the dose on Saturday and takes half a tablet on Sundays He has been following instructions as directed 6 weeks ago  His dose was decreased again in 12/22 even though previously had a dose reduction 3 months previously  Weight is about the same He has no symptoms of shakiness, anxiety or palpitations  TSH is again suppressed and free T4 is only minimally lower than before  He has been able to continue on the brand name Synthroid  He takes this on empty stomach before breakfast   Wt Readings from Last 3 Encounters:  10/07/21 205 lb (93 kg)  08/22/21 204 lb 12.8 oz (92.9 kg)  08/21/21 204 lb 12.8 oz (92.9 kg)    LABS:   Lab Results  Component Value Date   TSH 0.03 (L) 10/03/2021   TSH 0.02 (L) 08/19/2021   TSH 0.15 (L) 05/20/2021   FREET4 1.33 10/03/2021   FREET4 1.41 08/19/2021   FREET4 1.21 05/20/2021      HYPERTENSION:  He has had long-standing hypertension which is  managed with lisinopril 40, Catapres 0.1 mg patch and Coreg 1-1/2 tablets twice a day He was told to stop hydrochlorothiazide which was causing hyponatremia  He was evaluated for secondary hypertension and reports of his aldosterone/renin indicate normal levels Also was supposed to get the renal artery ultrasound but this was not done  He has checked his blood pressure regularly at home with the [employer] monitor on his arm Again he thinks his blood pressure is usually normal around  [phone] systolic and 70 or lower diastolic He has not missed any doses of his clonidine patch recently although he says that he is not always changing it every 7 days  Blood pressure is again appearing to be higher in the office, checked twice  BP Readings from Last 3 Encounters:  10/07/21 (!) 138/94  08/22/21 (!) 161/89  08/21/21 135/85       Allergies as of 10/07/2021       Reactions   Sulfa Drugs Cross Reactors Hives   Codeine Other (See Comments)   Made him very jittery    Hydrochlorothiazide Other (See Comments)   Hyponatremia        Medication List        Accurate as of October 07, 2021  4:30 PM. If you have any questions, ask your nurse or doctor.          STOP taking these medications    FLUoxetine 20 MG capsule Commonly known as: PROZAC Stopped by: [name], MD   FLUoxetine 40 MG capsule Commonly known as: PROZAC Stopped by: [name], MD       TAKE these medications    acetic acid-hydrocortisone OTIC solution Commonly known as: VOSOL-HC Place 3 drops into both ears 2 (two) times daily.   albuterol (2.5 MG/3ML) 0.083% nebulizer solution Commonly known as: PROVENTIL Take 3 mLs (2.5 mg total) by nebulization every 6 (six) hours as  needed for wheezing or shortness of breath.   albuterol 108 (90 Base) MCG/ACT inhaler Commonly known as: VENTOLIN HFA INHALE 2 PUFFS INTO THE LUNGS EVERY 6 HOURS AS NEEDED FOR WHEEZING/SHORTNESS OF BREATH   ALPRAZolam 1 MG tablet Commonly known as: XANAX Take 0.5-1 tablets (0.5-1 mg total) by mouth at bedtime as needed for anxiety.   amoxicillin-clavulanate 875-125 MG tablet Commonly known as: AUGMENTIN Take 1 tablet by mouth 2 (two) times daily.   aspirin 81 MG tablet Take 81 mg by mouth every other day.   azelastine 0.1 % nasal spray Commonly known as: ASTELIN Place 1 spray into both nostrils 2 (two) times daily. Use in each nostril as directed   benzonatate 200 MG capsule Commonly known as:  TESSALON Take 1 capsule (200 mg total) by mouth 2 (two) times daily as needed for cough.   carvedilol 12.5 MG tablet Commonly known as: COREG TAKE 1.5 TABLETS (18.75 MG TOTAL) BY MOUTH 2 (TWO) TIMES DAILY WITH A MEAL.   cloNIDine 0.1 mg/24hr patch Commonly known as: CATAPRES - Dosed in mg/24 hr PLACE 1 PATCH (0.1 MG TOTAL) ONTO THE SKIN ONCE A WEEK.   dextromethorphan-guaiFENesin 30-600 MG 12hr tablet Commonly known as: MUCINEX DM Take 1 tablet by mouth 2 (two) times daily.   flecainide 100 MG tablet Commonly known as: TAMBOCOR TAKE 1 TABLET BY MOUTH TWICE A DAY   lisinopril 40 MG tablet Commonly known as: ZESTRIL TAKE 1 TABLET BY MOUTH EVERY DAY   ondansetron 8 MG disintegrating tablet Commonly known as: Zofran ODT Take 1 tablet (8 mg total) by mouth every 8 (eight) hours as needed.   oseltamivir 75 MG capsule Commonly known as: Tamiflu Take 1 capsule (75 mg total) by mouth 2 (two) times daily.   pantoprazole 40 MG tablet Commonly known as: PROTONIX TAKE 1 TABLET BY MOUTH EVERY DAY   sertraline 50 MG tablet Commonly known as: ZOLOFT Take 1 tablet (50 mg total) by mouth daily.   simvastatin 40 MG tablet Commonly known as: ZOCOR TAKE 1 TABLET BY MOUTH EVERY DAY   Synthroid 175 MCG tablet Generic drug: levothyroxine TAKE 1 TABLET BY MOUTH DAILY BEFORE BREAKFAST.   tamsulosin 0.4 MG Caps capsule Commonly known as: FLOMAX TAKE 2 CAPSULES BY MOUTH EVERY DAY        Past Medical History:  Diagnosis Date   Allergy    Anxiety    Atrial fibrillation (HCC)    Bell palsy    Depression    Diastolic heart failure    Hypercholesterolemia    Hypertension    Pneumonia    Thyroid disease     Past Surgical History:  Procedure Laterality Date   CHOLECYSTECTOMY     HERNIA REPAIR     KNEE SURGERY     VASECTOMY      Family History  Problem Relation Age of Onset   Heart disease Mother    Heart attack Mother    Other Mother        thyroid problems   Kidney  cancer Mother    Cirrhosis Father    Other Father        thyroid problems   Alcohol abuse Father    Hypertension Father    Mental illness Brother    Parkinsonism Brother     Social History:  reports that he has been smoking cigarettes. He started smoking about 43 years ago. He has a 20.00 pack-year smoking history. He has never used smokeless tobacco. He reports current  alcohol use. He reports that he does not use drugs.  Allergies:  Allergies  Allergen Reactions   Sulfa Drugs Cross Reactors Hives   Codeine Other (See Comments)    Made him very jittery    Hydrochlorothiazide Other (See Comments)    Hyponatremia    REVIEW of systems:   HYPONATREMIA: His sodium had been better with stopping HCTZ  He was told to cut back on [facility]  He has likely reduced his overall fluid intake as sodium is now consistently normal Probably has mild SIADH  He is now taking sertraline instead of Prozac  CT scan of his chest did not show any tumor  Lab Results  Component Value Date   NA 135 08/19/2021   K 3.9 08/19/2021   CL 99 08/19/2021   CO2 29 08/19/2021      Examination:   BP (!) 138/94    Pulse (!) 55    Ht 5\' 9"  (1.753 m)    Wt 205 lb (93 kg)    SpO2 98%    BMI 30.27 kg/m      Assessments    HYPERTENSION:   His blood pressure today is increased but Likely has whitecoat syndrome as blood pressure is much better at home reportedly He has been irregular with his clonidine although is wearing his patch now  He will need to set up a reminder to change his clonidine patch on Fridays consistently If he is not able to do this he will need to switch to clonidine tablets  HYPOTHYROIDISM:  secondary to I-131 treatment  His TSH is continued to be suppressed even though he is taking 1 tablet less per week compared to his last visit when his TSH was low Not clear if this is related to any drug interaction  He will now go down to taking 4-1/2 tablets a week, skipping  Wednesdays also  When he finishes his supply he will go down to 112 mcg daily for now  Needs follow-up in another 2 months or so with lab work  Patient Instructions  Skip Wednesdays and Saturday, 1/2 Sunday on thyroid  Change patch weekly   [name] 10/07/2021, 4:30 PM

## 2021-10-07 NOTE — Patient Instructions (Addendum)
Skip Wednesdays and Saturday, 1/2 Sunday on thyroid  Change patch weekly

## 2021-10-10 ENCOUNTER — Other Ambulatory Visit: Payer: Self-pay | Admitting: Internal Medicine

## 2021-10-10 ENCOUNTER — Other Ambulatory Visit: Payer: Self-pay | Admitting: Registered Nurse

## 2021-10-10 DIAGNOSIS — R109 Unspecified abdominal pain: Secondary | ICD-10-CM

## 2021-10-29 ENCOUNTER — Other Ambulatory Visit: Payer: Self-pay | Admitting: Registered Nurse

## 2021-10-29 ENCOUNTER — Other Ambulatory Visit: Payer: Self-pay | Admitting: Internal Medicine

## 2021-10-29 DIAGNOSIS — I10 Essential (primary) hypertension: Secondary | ICD-10-CM

## 2021-10-29 DIAGNOSIS — F5102 Adjustment insomnia: Secondary | ICD-10-CM

## 2021-11-05 NOTE — Progress Notes (Signed)
Cardiology Office Note Date:  11/06/2021  Patient ID:  Jesse SEARCY [name], DOB [birth year], MRN [id number] PCP:  Maximiano Coss, NP  Electrophysiologist: Dr. Lovena Le Endo: Dr. Dwyane Dee    Chief Complaint: over due annual  History of Present Illness: Jesse STOGDILL [name] is a 61 y.o. male with history of Grave's disease, HTN, PVCs Afib  He comes today to be seen for Dr. Lovena Le, last seen by him Dec 2021, maintaining SR, minimal PVC burden, no changes were made.  TODAY All in all doing OK, though lately with a lot of stress, his wife has been very ill and in the hospital still with a lengthy stay, recently bought a home and struggling with the contruction team there to get things done. He reports he checks his BP once in a while at home is much better, 120's/80's, and as long as he can remember, his BP gets high at the MD office No CP, palpitations or cardiac awareness He deos not exercise but very active/busy around the house and helps care for grandkids as well. No unusual SOB, DOE No near syncope or syncope  No changes to his PMHx, CHA2DS2Vasc remains one (I don't see that he has had clinical HF)    AFib Hx Diagnosed 2012 in the environment of thyroid storm  AAD Hx Flecainide started 2018 for PVCs   Past Medical History:  Diagnosis Date   Allergy    Anxiety    Atrial fibrillation (HCC)    Bell palsy    Depression    Diastolic heart failure    Hypercholesterolemia    Hypertension    Pneumonia    Thyroid disease     Past Surgical History:  Procedure Laterality Date   CHOLECYSTECTOMY     HERNIA REPAIR     KNEE SURGERY     VASECTOMY      Current Outpatient Medications  Medication Sig Dispense Refill   acetic acid-hydrocortisone (VOSOL-HC) OTIC solution Place 3 drops into both ears 2 (two) times daily. 10 mL 0   albuterol (PROVENTIL) (2.5 MG/3ML) 0.083% nebulizer solution Take 3 mLs (2.5 mg total) by nebulization every 6 (six) hours as needed for wheezing or  shortness of breath. 150 mL 1   albuterol (VENTOLIN HFA) 108 (90 Base) MCG/ACT inhaler INHALE 2 PUFFS INTO THE LUNGS EVERY 6 HOURS AS NEEDED FOR WHEEZING/SHORTNESS OF BREATH 8.5 each 2   ALPRAZolam (XANAX) 1 MG tablet TAKE 0.5-1 TABLETS (0.5-1 MG TOTAL) BY MOUTH AT BEDTIME AS NEEDED FOR ANXIETY. 90 tablet 0   aspirin 81 MG tablet Take 81 mg by mouth every other day.     azelastine (ASTELIN) 0.1 % nasal spray Place 1 spray into both nostrils 2 (two) times daily. Use in each nostril as directed 30 mL 12   carvedilol (COREG) 12.5 MG tablet TAKE 1.5 TABLETS (18.75 MG TOTAL) BY MOUTH 2 (TWO) TIMES DAILY WITH A MEAL. 90 tablet 0   cloNIDine (CATAPRES - DOSED IN MG/24 HR) 0.1 mg/24hr patch PLACE 1 PATCH (0.1 MG TOTAL) ONTO THE SKIN ONCE A WEEK. 12 patch 1   flecainide (TAMBOCOR) 100 MG tablet TAKE 1 TABLET BY MOUTH TWICE A DAY 60 tablet 11   lisinopril (ZESTRIL) 40 MG tablet TAKE 1 TABLET BY MOUTH EVERY DAY 90 tablet 1   pantoprazole (PROTONIX) 40 MG tablet TAKE 1 TABLET BY MOUTH EVERY DAY 90 tablet 3   sertraline (ZOLOFT) 50 MG tablet Take 1 tablet (50 mg total) by mouth daily. 90 tablet 1  simvastatin (ZOCOR) 40 MG tablet TAKE 1 TABLET BY MOUTH EVERY DAY 90 tablet 1   SYNTHROID 175 MCG tablet TAKE 1 TABLET BY MOUTH DAILY BEFORE BREAKFAST. 90 tablet 2   tamsulosin (FLOMAX) 0.4 MG CAPS capsule TAKE 2 CAPSULES BY MOUTH EVERY DAY 180 capsule 0   amoxicillin-clavulanate (AUGMENTIN) 875-125 MG tablet Take 1 tablet by mouth 2 (two) times daily. (Patient not taking: Reported on 11/06/2021) 20 tablet 0   benzonatate (TESSALON) 200 MG capsule Take 1 capsule (200 mg total) by mouth 2 (two) times daily as needed for cough. (Patient not taking: Reported on 11/06/2021) 20 capsule 0   dextromethorphan-guaiFENesin (MUCINEX DM) 30-600 MG 12hr tablet Take 1 tablet by mouth 2 (two) times daily. (Patient not taking: Reported on 11/06/2021) 20 tablet 0   ondansetron (ZOFRAN ODT) 8 MG disintegrating tablet Take 1 tablet (8 mg  total) by mouth every 8 (eight) hours as needed. (Patient not taking: Reported on 11/06/2021) 4 tablet 0   oseltamivir (TAMIFLU) 75 MG capsule Take 1 capsule (75 mg total) by mouth 2 (two) times daily. (Patient not taking: Reported on 11/06/2021) 10 capsule 0   No current facility-administered medications for this visit.    Allergies:   Sulfa drugs cross reactors, Codeine, and Hydrochlorothiazide   Social History:  The patient  reports that he has been smoking cigarettes. He started smoking about 43 years ago. He has a 20.00 pack-year smoking history. He has never used smokeless tobacco. He reports current alcohol use. He reports that he does not use drugs.   Family History:  The patient's family history includes Alcohol abuse in his father; Cirrhosis in his father; Heart attack in his mother; Heart disease in his mother; Hypertension in his father; Kidney cancer in his mother; Mental illness in his brother; Other in his father and mother; Parkinsonism in his brother.  ROS:  Please see the history of present illness.    All other systems are reviewed and otherwise negative.   PHYSICAL EXAM:  VS:  BP (!) 160/96    Pulse 62    Ht 5\' 9"  (1.753 m)    Wt 202 lb 6.4 oz (91.8 kg)    SpO2 98%    BMI 29.89 kg/m  BMI: Body mass index is 29.89 kg/m. Well nourished, well developed, in no acute distress HEENT: normocephalic, atraumatic Neck: no JVD, carotid bruits or masses Cardiac:  RRR; no significant murmurs, no rubs, or gallops Lungs:  CTA b/l, no wheezing, rhonchi or rales Abd: soft, nontender MS: no deformity or  atrophy Ext: no edema Skin: wand dry, no rash Neuro:  No gross deficits appreciated Psych: euthymic mood, full affect   EKG:  Done today and reviewed by myself shows  SR 62bpm, stable intervals    Recent Labs: 05/13/2021: ALT 9 06/26/2021: Hemoglobin 15.8; Platelets 211.0 08/19/2021: BUN 9; Creatinine, Ser 0.88; Potassium 3.9; Sodium 135 10/03/2021: TSH 0.03  06/26/2021:  Cholesterol 185; HDL 40.40; LDL Cholesterol 106; Total CHOL/HDL Ratio 5; Triglycerides 190.0; VLDL 38.0   CrCl cannot be calculated (Patient's most recent lab result is older than the maximum 21 days allowed.).   Wt Readings from Last 3 Encounters:  11/06/21 202 lb 6.4 oz (91.8 kg)  10/07/21 205 lb (93 kg)  08/22/21 204 lb 12.8 oz (92.9 kg)     Other studies reviewed: Additional studies/records reviewed today include: summarized above  ASSESSMENT AND PLAN:  Paroxysmal Afib CHA2DS2Vasc is one,  not on a/c On flecainide and coreg, stable intervals no  burden by symptoms  PVCs no burden by symptoms None on today's EKG  HTN Better at home, under more then usual stress of late  4. Diastolic HF is listed as a diagnosis No symptoms or exam findings of volume OL Not on diuretics    Disposition: F/u with [place] in 38mo, sooner if needed  Current medicines are reviewed at length with the patient today.  The patient did not have any concerns regarding medicines.  Venetia Night, PA-C 11/06/2021 11:35 AM     [facility] [place] [address] [phone] (office)  [phone] (fax)

## 2021-11-06 ENCOUNTER — Other Ambulatory Visit: Payer: Self-pay | Admitting: Registered Nurse

## 2021-11-06 ENCOUNTER — Encounter: Payer: Self-pay | Admitting: Physician Assistant

## 2021-11-06 ENCOUNTER — Ambulatory Visit: Payer: 59 | Admitting: Physician Assistant

## 2021-11-06 ENCOUNTER — Other Ambulatory Visit: Payer: Self-pay

## 2021-11-06 VITALS — BP 160/96 | HR 62 | Ht 69.0 in | Wt 202.4 lb

## 2021-11-06 DIAGNOSIS — R109 Unspecified abdominal pain: Secondary | ICD-10-CM

## 2021-11-06 DIAGNOSIS — I493 Ventricular premature depolarization: Secondary | ICD-10-CM | POA: Diagnosis not present

## 2021-11-06 DIAGNOSIS — Z5181 Encounter for therapeutic drug level monitoring: Secondary | ICD-10-CM

## 2021-11-06 DIAGNOSIS — I480 Paroxysmal atrial fibrillation: Secondary | ICD-10-CM | POA: Diagnosis not present

## 2021-11-06 DIAGNOSIS — Z79899 Other long term (current) drug therapy: Secondary | ICD-10-CM

## 2021-11-06 DIAGNOSIS — I10 Essential (primary) hypertension: Secondary | ICD-10-CM | POA: Diagnosis not present

## 2021-11-06 MED ORDER — CARVEDILOL 12.5 MG PO TABS: 18.7500 mg | ORAL_TABLET | Freq: Two times a day (BID) | ORAL | 6 refills | Status: DC

## 2021-11-06 MED ORDER — LISINOPRIL 40 MG PO TABS: 40.0000 mg | ORAL_TABLET | Freq: Every day | ORAL | 6 refills | Status: DC

## 2021-11-06 NOTE — Patient Instructions (Signed)
Medication Instructions:   Your physician recommends that you continue on your current medications as directed. Please refer to the Current Medication list given to you today.  *If you need a refill on your cardiac medications before your next appointment, please call your pharmacy*   Lab Work: [place]     If you have labs (blood work) drawn today and your tests are completely normal, you will receive your results only by: [place] (if you have MyChart) OR A paper copy in the mail If you have any lab test that is abnormal or we need to change your treatment, we will call you to review the results.   Testing/Procedures: NONE ORDERED  TODAY      Follow-Up: At [facility], you and your health needs are our priority.  As part of our continuing mission to provide you with exceptional heart care, we have created designated Provider Care Teams.  These Care Teams include your primary Cardiologist (physician) and Advanced Practice Providers (APPs -  Physician Assistants and Nurse Practitioners) who all work together to provide you with the care you need, when you need it.  We recommend signing up for the patient portal called "MyChart".  Sign up information is provided on this After Visit Summary.  MyChart is used to connect with patients for Virtual Visits (Telemedicine).  Patients are able to view lab/test results, encounter notes, upcoming appointments, etc.  Non-urgent messages can be sent to your provider as well.   To learn more about what you can do with MyChart, go to [email or web address].    Your next appointment:   6 month(s)  The format for your next appointment:   In Person  Provider:   You may see Dr. Lovena Le  or one of the following Advanced Practice Providers on your designated Care Team:   Tommye Standard, [place] Legrand Como "Jonni Sanger" Chalmers Cater, [place]     Other Instructions

## 2021-11-08 ENCOUNTER — Other Ambulatory Visit: Payer: Self-pay | Admitting: Registered Nurse

## 2021-11-08 DIAGNOSIS — J449 Chronic obstructive pulmonary disease, unspecified: Secondary | ICD-10-CM

## 2021-12-03 ENCOUNTER — Other Ambulatory Visit (INDEPENDENT_AMBULATORY_CARE_PROVIDER_SITE_OTHER): Payer: 59

## 2021-12-03 ENCOUNTER — Other Ambulatory Visit: Payer: Self-pay

## 2021-12-03 DIAGNOSIS — E890 Postprocedural hypothyroidism: Secondary | ICD-10-CM | POA: Diagnosis not present

## 2021-12-03 LAB — TSH: TSH: 0.3 u[IU]/mL — ABNORMAL LOW (ref 0.35–5.50)

## 2021-12-03 LAB — T4, FREE: Free T4: 1.29 ng/dL (ref 0.60–1.60)

## 2021-12-08 NOTE — Progress Notes (Signed)
Currently on 1 tablet 175 mcg levothyroxine daily except half on Sundays and none on Wednesdays.  Now will skip to half tablet on Sundays also

## 2022-01-01 ENCOUNTER — Other Ambulatory Visit: Payer: Self-pay | Admitting: Registered Nurse

## 2022-01-01 DIAGNOSIS — N319 Neuromuscular dysfunction of bladder, unspecified: Secondary | ICD-10-CM

## 2022-01-03 ENCOUNTER — Other Ambulatory Visit: Payer: Self-pay | Admitting: Registered Nurse

## 2022-01-03 DIAGNOSIS — E785 Hyperlipidemia, unspecified: Secondary | ICD-10-CM

## 2022-01-06 ENCOUNTER — Encounter: Payer: Self-pay | Admitting: Registered Nurse

## 2022-01-06 ENCOUNTER — Ambulatory Visit: Payer: 59 | Admitting: Registered Nurse

## 2022-01-06 ENCOUNTER — Other Ambulatory Visit: Payer: Self-pay | Admitting: Registered Nurse

## 2022-01-06 VITALS — BP 132/78 | HR 58 | Temp 98.0°F | Resp 16 | Ht 69.0 in | Wt 208.6 lb

## 2022-01-06 DIAGNOSIS — R0981 Nasal congestion: Secondary | ICD-10-CM

## 2022-01-06 DIAGNOSIS — E559 Vitamin D deficiency, unspecified: Secondary | ICD-10-CM

## 2022-01-06 DIAGNOSIS — R110 Nausea: Secondary | ICD-10-CM

## 2022-01-06 DIAGNOSIS — B356 Tinea cruris: Secondary | ICD-10-CM

## 2022-01-06 DIAGNOSIS — R5382 Chronic fatigue, unspecified: Secondary | ICD-10-CM

## 2022-01-06 LAB — COMPREHENSIVE METABOLIC PANEL
ALT: 20 U/L (ref 0–53)
AST: 19 U/L (ref 0–37)
Albumin: 4.4 g/dL (ref 3.5–5.2)
Alkaline Phosphatase: 92 U/L (ref 39–117)
BUN: 15 mg/dL (ref 6–23)
CO2: 30 mEq/L (ref 19–32)
Calcium: 9.4 mg/dL (ref 8.4–10.5)
Chloride: 98 mEq/L (ref 96–112)
Creatinine, Ser: 0.9 mg/dL (ref 0.40–1.50)
GFR: 91.17 mL/min (ref >60.00)
Glucose, Bld: 104 mg/dL — ABNORMAL HIGH (ref 70–99)
Potassium: 4.6 mEq/L (ref 3.5–5.1)
Sodium: 134 mEq/L — ABNORMAL LOW (ref 135–145)
Total Bilirubin: 0.3 mg/dL (ref 0.2–1.2)
Total Protein: 6.8 g/dL (ref 6.0–8.3)

## 2022-01-06 LAB — URINALYSIS, ROUTINE W REFLEX MICROSCOPIC
Bilirubin Urine: NEGATIVE
Hgb urine dipstick: NEGATIVE
Ketones, ur: NEGATIVE
Leukocytes,Ua: NEGATIVE
Nitrite: NEGATIVE
RBC / HPF: NONE SEEN (ref 0–?)
Specific Gravity, Urine: 1.01 (ref 1.000–1.030)
Total Protein, Urine: NEGATIVE
Urine Glucose: NEGATIVE
Urobilinogen, UA: 0.2 (ref 0.0–1.0)
WBC, UA: NONE SEEN (ref 0–?)
pH: 6.5 (ref 5.0–8.0)

## 2022-01-06 LAB — CBC WITH DIFFERENTIAL/PLATELET
Basophils Absolute: 0 10*3/uL (ref 0.0–0.1)
Basophils Relative: 0.4 % (ref 0.0–3.0)
Eosinophils Absolute: 0.2 10*3/uL (ref 0.0–0.7)
Eosinophils Relative: 2 % (ref 0.0–5.0)
HCT: 45.5 % (ref 39.0–52.0)
Hemoglobin: 15.2 g/dL (ref 13.0–17.0)
Lymphocytes Relative: 17.9 % (ref 12.0–46.0)
Lymphs Abs: 2 10*3/uL (ref 0.7–4.0)
MCHC: 33.5 g/dL (ref 30.0–36.0)
MCV: 91.1 fl (ref 78.0–100.0)
Monocytes Absolute: 0.9 10*3/uL (ref 0.1–1.0)
Monocytes Relative: 8.2 % (ref 3.0–12.0)
Neutro Abs: 8.2 10*3/uL — ABNORMAL HIGH (ref 1.4–7.7)
Neutrophils Relative %: 71.5 % (ref 43.0–77.0)
Platelets: 202 10*3/uL (ref 150.0–400.0)
RBC: 4.99 Mil/uL (ref 4.22–5.81)
RDW: 13.9 % (ref 11.5–15.5)
WBC: 11.5 10*3/uL — ABNORMAL HIGH (ref 4.0–10.5)

## 2022-01-06 LAB — VITAMIN D 25 HYDROXY (VIT D DEFICIENCY, FRACTURES): VITD: 18.93 ng/mL — ABNORMAL LOW (ref 30.00–100.00)

## 2022-01-06 LAB — B12 AND FOLATE PANEL
Folate: 21.2 ng/mL (ref >5.9)
Vitamin B-12: 416 pg/mL (ref 211–911)

## 2022-01-06 LAB — HEMOGLOBIN A1C: Hgb A1c MFr Bld: 5.9 % (ref 4.6–6.5)

## 2022-01-06 MED ORDER — ONDANSETRON 8 MG PO TBDP: 8.0000 mg | ORAL_TABLET | Freq: Three times a day (TID) | ORAL | 0 refills | Status: DC | PRN

## 2022-01-06 MED ORDER — VITAMIN D (ERGOCALCIFEROL) 1.25 MG (50000 UNIT) PO CAPS
50000.0000 [IU] | ORAL_CAPSULE | ORAL | 0 refills | Status: DC
Start: 2022-01-06 — End: 2022-09-12

## 2022-01-06 MED ORDER — NYSTATIN 100000 UNIT/GM EX POWD
1.0000 | Freq: Three times a day (TID) | CUTANEOUS | 0 refills | Status: DC
Start: 2022-01-06 — End: 2022-05-09

## 2022-01-06 MED ORDER — MONTELUKAST SODIUM 10 MG PO TABS: 10.0000 mg | ORAL_TABLET | Freq: Every day | ORAL | 3 refills | Status: DC

## 2022-01-06 NOTE — Progress Notes (Signed)
? ?Established Patient Office Visit ? ?Subjective:  ?Patient ID: Jesse Europe [name], male    DOB: [birth year]  Age: 61 y.o. MRN: [id number] ? ?CC:  ?Chief Complaint  ?Patient presents with  ? Nasal Congestion  ?  Pt states he is having some congestion 3 to  4 days ago ?No fever   ? Fatigue  ?  Pt states he is feeling more fatigued than normal x 1 month   ? ? ?HPI ?Jesse Pigeon Smolenski [name] presents for fatigue, nasal congestion ? ?Fatigue ?Baseline, but worse than normal over the past month ?Has been on synthroid through Dr. Dwyane Dee - TSH has been low but fairly stable, T4 wnl as of 1 mo ago ?He does care for his wife full time and gets limited sleep because of this.  ?He has had a follow up with cardiology where things had been stable ?No doe, shob, chest pain, headache, nvd, visual changes, claudication, cough, dizziness ? ?Nasal congestion ?Ongoing 3-4 days ?No sick contacts ?No shob, cough, doe, chest pain ?Has not tested for COVID ? ?Jock Itch ?Ongoing issue. Has used OTC with limited relief. ?No weeping or ulcerations ? ?Nausea and Vomiting ?Intermittent, r/t stress ?Uses zofran prn. Intermittent use ?Needs refills ?No AE.  ? ?Outpatient Medications Prior to Visit  ?Medication Sig Dispense Refill  ? albuterol (PROVENTIL) (2.5 MG/3ML) 0.083% nebulizer solution Take 3 mLs (2.5 mg total) by nebulization every 6 (six) hours as needed for wheezing or shortness of breath. 150 mL 1  ? albuterol (VENTOLIN HFA) 108 (90 Base) MCG/ACT inhaler INHALE 2 PUFFS INTO THE LUNGS EVERY 6 HOURS AS NEEDED FOR WHEEZING/SHORTNESS OF BREATH 8.5 each 2  ? ALPRAZolam (XANAX) 1 MG tablet TAKE 0.5-1 TABLETS (0.5-1 MG TOTAL) BY MOUTH AT BEDTIME AS NEEDED FOR ANXIETY. 90 tablet 0  ? aspirin 81 MG tablet Take 81 mg by mouth every other day.    ? azelastine (ASTELIN) 0.1 % nasal spray Place 1 spray into both nostrils 2 (two) times daily. Use in each nostril as directed 30 mL 12  ? carvedilol (COREG) 12.5 MG tablet Take 1.5 tablets (18.75 mg total)  by mouth 2 (two) times daily with a meal. 90 tablet 6  ? cloNIDine (CATAPRES - DOSED IN MG/24 HR) 0.1 mg/24hr patch PLACE 1 PATCH (0.1 MG TOTAL) ONTO THE SKIN ONCE A WEEK. 12 patch 1  ? flecainide (TAMBOCOR) 100 MG tablet TAKE 1 TABLET BY MOUTH TWICE A DAY 60 tablet 11  ? lisinopril (ZESTRIL) 40 MG tablet Take 1 tablet (40 mg total) by mouth daily. 30 tablet 6  ? pantoprazole (PROTONIX) 40 MG tablet TAKE 1 TABLET BY MOUTH EVERY DAY 90 tablet 3  ? sertraline (ZOLOFT) 50 MG tablet Take 1 tablet (50 mg total) by mouth daily. 90 tablet 1  ? simvastatin (ZOCOR) 40 MG tablet TAKE 1 TABLET BY MOUTH EVERY DAY 90 tablet 1  ? SYNTHROID 175 MCG tablet TAKE 1 TABLET BY MOUTH DAILY BEFORE BREAKFAST. 90 tablet 2  ? tamsulosin (FLOMAX) 0.4 MG CAPS capsule TAKE 2 CAPSULES BY MOUTH EVERY DAY 180 capsule 0  ? acetic acid-hydrocortisone (VOSOL-HC) OTIC solution Place 3 drops into both ears 2 (two) times daily. (Patient not taking: Reported on 01/06/2022) 10 mL 0  ? amoxicillin-clavulanate (AUGMENTIN) 875-125 MG tablet Take 1 tablet by mouth 2 (two) times daily. (Patient not taking: Reported on 11/06/2021) 20 tablet 0  ? benzonatate (TESSALON) 200 MG capsule Take 1 capsule (200 mg total) by mouth 2 (two)  times daily as needed for cough. (Patient not taking: Reported on 11/06/2021) 20 capsule 0  ? dextromethorphan-guaiFENesin (MUCINEX DM) 30-600 MG 12hr tablet Take 1 tablet by mouth 2 (two) times daily. (Patient not taking: Reported on 11/06/2021) 20 tablet 0  ? ondansetron (ZOFRAN ODT) 8 MG disintegrating tablet Take 1 tablet (8 mg total) by mouth every 8 (eight) hours as needed. (Patient not taking: Reported on 11/06/2021) 4 tablet 0  ? oseltamivir (TAMIFLU) 75 MG capsule Take 1 capsule (75 mg total) by mouth 2 (two) times daily. (Patient not taking: Reported on 11/06/2021) 10 capsule 0  ? ?No facility-administered medications prior to visit.  ? ? ?Review of Systems  ?Constitutional: Negative.   ?HENT:  Positive for congestion.   ?Eyes:  Negative.   ?Respiratory: Negative.    ?Cardiovascular: Negative.   ?Gastrointestinal: Negative.   ?Genitourinary: Negative.   ?Musculoskeletal: Negative.   ?Skin:  Positive for rash. Negative for color change, pallor and wound.  ?Neurological: Negative.   ?Psychiatric/Behavioral: Negative.    ?All other systems reviewed and are negative. ? ?  ?Objective:  ?  ? ?BP 132/78   Pulse (!) 58   Temp 98 ?F (36.7 ?C) (Temporal)   Resp 16   Ht '5\' 9"'$  (1.753 m)   Wt 208 lb 9.6 oz (94.6 kg)   SpO2 96%   BMI 30.80 kg/m?  ? ?Wt Readings from Last 3 Encounters:  ?01/06/22 208 lb 9.6 oz (94.6 kg)  ?11/06/21 202 lb 6.4 oz (91.8 kg)  ?10/07/21 205 lb (93 kg)  ? ?Physical Exam ?Constitutional:   ?   General: He is not in acute distress. ?   Appearance: Normal appearance. He is normal weight. He is not ill-appearing, toxic-appearing or diaphoretic.  ?HENT:  ?   Nose: Congestion present.  ?Cardiovascular:  ?   Rate and Rhythm: Normal rate and regular rhythm.  ?   Heart sounds: Normal heart sounds. No murmur heard. ?  No friction rub. No gallop.  ?Pulmonary:  ?   Effort: Pulmonary effort is normal. No respiratory distress.  ?   Breath sounds: Normal breath sounds. No stridor. No wheezing, rhonchi or rales.  ?Chest:  ?   Chest wall: No tenderness.  ?Genitourinary: ?   Comments: Declined ?Neurological:  ?   General: No focal deficit present.  ?   Mental Status: He is alert and oriented to person, place, and time. Mental status is at baseline.  ?Psychiatric:     ?   Mood and Affect: Mood normal.     ?   Behavior: Behavior normal.     ?   Thought Content: Thought content normal.     ?   Judgment: Judgment normal.  ? ? ?No results found for any visits on 01/06/22. ? ? ? ?The 10-year ASCVD risk score (Arnett DK, et al., 2019) is: 20.7% ? ?  ?Assessment & Plan:  ? ?Problem List Items Addressed This Visit   ?None ?Visit Diagnoses   ? ? Nasal congestion    -  Primary  ? Relevant Medications  ? montelukast (SINGULAIR) 10 MG tablet  ? Jock  itch      ? Relevant Medications  ? nystatin (MYCOSTATIN/NYSTOP) powder  ? Nausea without vomiting      ? Relevant Medications  ? ondansetron (ZOFRAN ODT) 8 MG disintegrating tablet  ? Chronic fatigue      ? Relevant Orders  ? CBC with Differential/Platelet  ? Comprehensive metabolic panel  ? Hemoglobin A1c  ?  Urinalysis, Routine w reflex microscopic  ? Vitamin D (25 hydroxy)  ? B12 and Folate Panel  ? ?  ? ? ?Meds ordered this encounter  ?Medications  ? ondansetron (ZOFRAN ODT) 8 MG disintegrating tablet  ?  Sig: Take 1 tablet (8 mg total) by mouth every 8 (eight) hours as needed.  ?  Dispense:  4 tablet  ?  Refill:  0  ?  Order Specific Question:   Supervising Provider  ?  Answer:   Carlota Raspberry, JEFFREY R [[id number]]  ? nystatin (MYCOSTATIN/NYSTOP) powder  ?  Sig: Apply 1 application. topically 3 (three) times daily.  ?  Dispense:  15 g  ?  Refill:  0  ?  Order Specific Question:   Supervising Provider  ?  Answer:   Carlota Raspberry, JEFFREY R [[id number]]  ? montelukast (SINGULAIR) 10 MG tablet  ?  Sig: Take 1 tablet (10 mg total) by mouth at bedtime.  ?  Dispense:  30 tablet  ?  Refill:  3  ?  Order Specific Question:   Supervising Provider  ?  Answer:   Carlota Raspberry, JEFFREY R [[id number]]  ? ? ?Return if symptoms worsen or fail to improve.  ? ?PLAN ? ?Maximiano Coss, NP ?

## 2022-01-06 NOTE — Patient Instructions (Signed)
Mr. Ryans -  ?Great to see you ?Call with concerns ? ?I'll let you know how labs look ? ?Call if you need anything ? ?Thanks, ? ?Rich  ?

## 2022-01-07 ENCOUNTER — Other Ambulatory Visit: Payer: Self-pay | Admitting: Registered Nurse

## 2022-01-07 ENCOUNTER — Telehealth: Payer: Self-pay | Admitting: Registered Nurse

## 2022-01-07 DIAGNOSIS — N319 Neuromuscular dysfunction of bladder, unspecified: Secondary | ICD-10-CM

## 2022-01-07 NOTE — Telephone Encounter (Signed)
Pt called in asking for lab results.  ? ?Pt can be reached at the home #  ?

## 2022-01-08 NOTE — Telephone Encounter (Signed)
Patient is calling in upset. Wanted to know why he was sent in Vitamin D medicine, and he hasnt heard back from anyone about lab results.  ?

## 2022-01-09 ENCOUNTER — Other Ambulatory Visit: Payer: Self-pay | Admitting: Registered Nurse

## 2022-01-09 DIAGNOSIS — F5102 Adjustment insomnia: Secondary | ICD-10-CM

## 2022-01-09 NOTE — Telephone Encounter (Signed)
He's still a few weeks early per PDMP. Has he been taking more than the 1/ day prescribed? Should he need something more for anxiety, we can consider adding hydroxyzine ? ?Thanks, ? ?Rich

## 2022-01-09 NOTE — Telephone Encounter (Signed)
Thanks for this - when I see an active MyChart account, results will be explained there. I did discuss this with him at his visit. ? ?Thanks, ? ?Rich

## 2022-01-09 NOTE — Telephone Encounter (Signed)
Patient is returning a call. °

## 2022-01-09 NOTE — Telephone Encounter (Signed)
Called patient to discuss lab results.

## 2022-01-09 NOTE — Telephone Encounter (Signed)
Patient states he now needs a refill for alprazolam  ?

## 2022-01-09 NOTE — Telephone Encounter (Signed)
Labs normal Vitamin D Sent in on 01/06/22  ? ?Called pt to inform no answer LM to call back  ?

## 2022-01-13 ENCOUNTER — Other Ambulatory Visit: Payer: Self-pay | Admitting: Registered Nurse

## 2022-01-13 DIAGNOSIS — F5102 Adjustment insomnia: Secondary | ICD-10-CM

## 2022-01-13 MED ORDER — HYDROXYZINE HCL 10 MG PO TABS: 5.0000 mg | ORAL_TABLET | Freq: Three times a day (TID) | ORAL | 1 refills | Status: DC | PRN

## 2022-01-13 NOTE — Telephone Encounter (Signed)
Patient states he would like to consider taking the Hydroxyzine as well for Anxiety ?

## 2022-01-13 NOTE — Telephone Encounter (Signed)
Have sent hydroxyzine '10mg'$  with instr: take 5-'10mg'$  ( 0.5-1 tablet) by mouth tid prn: anxiety ? ?Thanks, ? ?Rich

## 2022-01-13 NOTE — Progress Notes (Signed)
.  h 

## 2022-01-13 NOTE — Telephone Encounter (Signed)
Patient is calling back in regards to this °

## 2022-02-02 ENCOUNTER — Other Ambulatory Visit: Payer: Self-pay | Admitting: Endocrinology

## 2022-02-04 ENCOUNTER — Telehealth: Payer: Self-pay | Admitting: *Deleted

## 2022-02-04 NOTE — Telephone Encounter (Signed)
   Pre-operative Risk Assessment    Patient Name: [name]  DOB: Nov 26, 1960 MRN: [id number]     Request for Surgical Clearance    Procedure:   LEFT MESSAGE FOR DDS OFFICE TO CALL BACK WITH ACTUAL PROCEDURE TO BE DONE  Date of Surgery:  Clearance TBD                                 Surgeon:  DR. Tamela Oddi, DDS Surgeon's Group or Practice Name:  [facility] Phone number:  [phone] Fax number:  [phone]   Type of Clearance Requested:   - Medical  HOLD ASA    Type of Anesthesia:   IV ANESTHESIA (ZOFRAN, VERSED, FENTANYL, DECADRON)   Additional requests/questions:    Jiles Prows   02/04/2022, 5:05 PM

## 2022-02-05 ENCOUNTER — Other Ambulatory Visit: Payer: Self-pay | Admitting: Registered Nurse

## 2022-02-05 DIAGNOSIS — F5102 Adjustment insomnia: Secondary | ICD-10-CM

## 2022-02-05 MED ORDER — HYDROXYZINE HCL 10 MG PO TABS: 5.0000 mg | ORAL_TABLET | Freq: Three times a day (TID) | ORAL | 1 refills | Status: DC | PRN

## 2022-02-05 NOTE — Telephone Encounter (Signed)
Dental office called back and confirmed procedure to be done.  ADDENDUM: PROCEDURE: 4 TEETH TO BE SURGICALLY EXTRACTED

## 2022-02-05 NOTE — Telephone Encounter (Signed)
Have sent  Thanks,  Rich

## 2022-02-05 NOTE — Telephone Encounter (Signed)
Dental office called in regard to clearance, the have not yet received the notes. I informed the requesting office that we faxed over the clearance notes today at 11:58 am per Coletta Memos, FNP. I stated I will re-fax the clearance notes. I was also asked if I could email it to them.   Email address given to me is :  [email or web address]

## 2022-02-05 NOTE — Telephone Encounter (Signed)
   Primary Cardiologist: Cristopher Peru, MD  Chart reviewed as part of pre-operative protocol coverage. Simple dental extractions are considered low risk procedures per guidelines and generally do not require any specific cardiac clearance. It is also generally accepted that for simple extractions and dental cleanings, there is no need to interrupt blood thinner therapy.   SBE prophylaxis is not required for the patient.  Patient's aspirin is not prescribed by cardiology provider.  Recommendations for holding aspirin will need to come from prescribing office/provider.  I will route this recommendation to the requesting party via Epic fax function and remove from pre-op pool.  Please call with questions.  Deberah Pelton, NP 02/05/2022, 11:58 AM

## 2022-02-05 NOTE — Telephone Encounter (Signed)
Patient wants a 90 day prescription. Please advise

## 2022-02-06 ENCOUNTER — Other Ambulatory Visit: Payer: Self-pay | Admitting: Registered Nurse

## 2022-02-06 DIAGNOSIS — J449 Chronic obstructive pulmonary disease, unspecified: Secondary | ICD-10-CM

## 2022-02-07 ENCOUNTER — Other Ambulatory Visit: Payer: Self-pay | Admitting: Registered Nurse

## 2022-02-07 DIAGNOSIS — E559 Vitamin D deficiency, unspecified: Secondary | ICD-10-CM

## 2022-02-08 ENCOUNTER — Other Ambulatory Visit: Payer: Self-pay | Admitting: Endocrinology

## 2022-02-25 ENCOUNTER — Ambulatory Visit: Payer: 59 | Admitting: Registered Nurse

## 2022-02-26 ENCOUNTER — Other Ambulatory Visit: Payer: Self-pay | Admitting: Registered Nurse

## 2022-02-26 DIAGNOSIS — E559 Vitamin D deficiency, unspecified: Secondary | ICD-10-CM

## 2022-03-09 ENCOUNTER — Encounter: Payer: Self-pay | Admitting: Endocrinology

## 2022-03-09 ENCOUNTER — Ambulatory Visit: Payer: 59 | Admitting: Endocrinology

## 2022-03-09 VITALS — BP 140/88 | HR 98 | Ht 69.0 in | Wt 204.4 lb

## 2022-03-09 DIAGNOSIS — E890 Postprocedural hypothyroidism: Secondary | ICD-10-CM | POA: Diagnosis not present

## 2022-03-09 DIAGNOSIS — E559 Vitamin D deficiency, unspecified: Secondary | ICD-10-CM | POA: Diagnosis not present

## 2022-03-09 DIAGNOSIS — I10 Essential (primary) hypertension: Secondary | ICD-10-CM

## 2022-03-09 NOTE — Progress Notes (Signed)
Patient ID: Jesse Simmer [name], male   DOB: [birth year], 61 y.o.   MRN: [id number]   Reason for Appointment: Follow-up    History of Present Illness:   The hypothyroidism was first diagnosed in 11/13. Previously had I-131 treatment for Graves' disease He was initially started with 88 mcg but his dose needed to be increased progressively  He has been treated with brand name SYNTHROID, the dose has been adjusted previously based on his labs  He is taking 175 mcg brand-name Synthroid, 5-1/2 tablets a week He skips the dose on Wed and takes half a tablet on Sundays Although on his last visit he was supposed to skip Wednesdays and Saturdays he is only skipping 1 day a week  His dose was decreased again in 12/22 even though previously had a dose reduction 3 months previously  Weight is about the same He generally feels fairly good except for some depression  He has been able to continue on the brand name Synthroid  He takes this on empty stomach before breakfast  Has not had his labs drawn yet  Wt Readings from Last 3 Encounters:  03/09/22 204 lb 6.4 oz (92.7 kg)  01/06/22 208 lb 9.6 oz (94.6 kg)  11/06/21 202 lb 6.4 oz (91.8 kg)    LABS:   Lab Results  Component Value Date   TSH 0.30 (L) 12/03/2021   TSH 0.03 (L) 10/03/2021   TSH 0.02 (L) 08/19/2021   FREET4 1.29 12/03/2021   FREET4 1.33 10/03/2021   FREET4 1.41 08/19/2021      HYPERTENSION:  He has had long-standing hypertension which is  managed with lisinopril 40, Catapres 0.1 mg patch and Coreg 1-1/2 tablets twice a day He was told to stop hydrochlorothiazide which was causing hyponatremia  He was evaluated for secondary hypertension and reports of his aldosterone/renin indicate normal levels Also was supposed to get the renal artery ultrasound but this was not done  He has checked his blood pressure regularly at home with the [employer] monitor on his arm  Home his blood pressure is usually normal around  120-130 systolic and 70 + diastolic May be higher today because of being stressed and rushed  Blood pressure is :  BP Readings from Last 3 Encounters:  03/09/22 140/88  01/06/22 132/78  11/06/21 (!) 160/96       Allergies as of 03/09/2022       Reactions   Sulfa Drugs Cross Reactors Hives   Codeine Other (See Comments)   Made him very jittery    Hydrochlorothiazide Other (See Comments)   Hyponatremia        Medication List        Accurate as of March 09, 2022  8:51 PM. If you have any questions, ask your nurse or doctor.          STOP taking these medications    ALPRAZolam 1 MG tablet Commonly known as: XANAX Stopped by: Reather Littler, MD       TAKE these medications    albuterol (2.5 MG/3ML) 0.083% nebulizer solution Commonly known as: PROVENTIL Take 3 mLs (2.5 mg total) by nebulization every 6 (six) hours as needed for wheezing or shortness of breath.   albuterol 108 (90 Base) MCG/ACT inhaler Commonly known as: VENTOLIN HFA INHALE 2 PUFFS INTO THE LUNGS EVERY 6 HOURS AS NEEDED FOR WHEEZING/SHORTNESS OF BREATH   aspirin 81 MG tablet Take 81 mg by mouth every other day.   azelastine 0.1 % nasal  spray Commonly known as: ASTELIN Place 1 spray into both nostrils 2 (two) times daily. Use in each nostril as directed   carvedilol 12.5 MG tablet Commonly known as: COREG Take 1.5 tablets (18.75 mg total) by mouth 2 (two) times daily with a meal.   cloNIDine 0.1 mg/24hr patch Commonly known as: CATAPRES - Dosed in mg/24 hr PLACE 1 PATCH ONTO THE SKIN ONCE A WEEK   flecainide 100 MG tablet Commonly known as: TAMBOCOR TAKE 1 TABLET BY MOUTH TWICE A DAY   hydrOXYzine 10 MG tablet Commonly known as: ATARAX Take 0.5-1 tablets (5-10 mg total) by mouth 3 (three) times daily as needed.   lisinopril 40 MG tablet Commonly known as: ZESTRIL Take 1 tablet (40 mg total) by mouth daily.   montelukast 10 MG tablet Commonly known as: SINGULAIR Take 1 tablet (10  mg total) by mouth at bedtime.   nystatin powder Commonly known as: MYCOSTATIN/NYSTOP Apply 1 application. topically 3 (three) times daily.   ondansetron 8 MG disintegrating tablet Commonly known as: Zofran ODT Take 1 tablet (8 mg total) by mouth every 8 (eight) hours as needed.   pantoprazole 40 MG tablet Commonly known as: PROTONIX TAKE 1 TABLET BY MOUTH EVERY DAY   sertraline 50 MG tablet Commonly known as: ZOLOFT Take 1 tablet (50 mg total) by mouth daily.   simvastatin 40 MG tablet Commonly known as: ZOCOR TAKE 1 TABLET BY MOUTH EVERY DAY   Synthroid 175 MCG tablet Generic drug: levothyroxine TAKE 1 TABLET BY MOUTH EVERY DAY BEFORE BREAKFAST   tamsulosin 0.4 MG Caps capsule Commonly known as: FLOMAX TAKE 2 CAPSULES BY MOUTH EVERY DAY   Vitamin D (Ergocalciferol) 1.25 MG (50000 UNIT) Caps capsule Commonly known as: DRISDOL Take 1 capsule (50,000 Units total) by mouth every 7 (seven) days.        Past Medical History:  Diagnosis Date   Allergy    Anxiety    Atrial fibrillation (HCC)    Bell palsy    Depression    Diastolic heart failure    Hypercholesterolemia    Hypertension    Pneumonia    Thyroid disease     Past Surgical History:  Procedure Laterality Date   CHOLECYSTECTOMY     HERNIA REPAIR     KNEE SURGERY     VASECTOMY      Family History  Problem Relation Age of Onset   Heart disease Mother    Heart attack Mother    Other Mother        thyroid problems   Kidney cancer Mother    Cirrhosis Father    Other Father        thyroid problems   Alcohol abuse Father    Hypertension Father    Mental illness Brother    Parkinsonism Brother     Social History:  reports that he has been smoking cigarettes. He started smoking about 43 years ago. He has a 20.00 pack-year smoking history. He has never used smokeless tobacco. He reports current alcohol use. He reports that he does not use drugs.  Allergies:  Allergies  Allergen Reactions    Sulfa Drugs Cross Reactors Hives   Codeine Other (See Comments)    Made him very jittery    Hydrochlorothiazide Other (See Comments)    Hyponatremia    REVIEW of systems:   HYPONATREMIA: His sodium had been better with stopping HCTZ  He was told to cut back on [facility]  He has likely reduced  his overall fluid intake as sodium is now consistently normal Probably has mild SIADH  He is now taking sertraline 50 mg daily  CT scan of his chest did not show any tumor  Lab Results  Component Value Date   NA 134 (L) 01/06/2022   K 4.6 01/06/2022   CL 98 01/06/2022   CO2 30 01/06/2022    Vitamin D deficiency: His recent level in April was about 19 done by PCP    Examination:   BP 140/88 (BP Location: Right Arm, Patient Position: Sitting, Cuff Size: Normal)   Pulse 98   Ht 5\' 9"  (1.753 m)   Wt 204 lb 6.4 oz (92.7 kg)   SpO2 94%   BMI 30.18 kg/m      Assessments    HYPERTENSION:   His blood pressure today is slightly increased but apparently better at home We will continue to monitor at home and let us know if it is consistently high, also follow-up with PCP  HYPOTHYROIDISM:  secondary to I-131 treatment  His TSH is last suppressed and currently taking 5-1/2 tablets a week of the 175 mcg dose which is equivalent to 137 mcg daily We will check his labs today and adjust the dose accordingly  VITAMIN D deficiency: He was given 50,000 units to take weekly but he says he is run out of the prescription He will now take 5000 units OTC daily and follow-up with PCP  Patient Instructions  Vitaman D, 5000 units daily   Reather Littler 03/09/2022, 8:51 PM

## 2022-03-10 ENCOUNTER — Ambulatory Visit: Payer: 59 | Admitting: Endocrinology

## 2022-03-10 ENCOUNTER — Other Ambulatory Visit: Payer: Self-pay | Admitting: Registered Nurse

## 2022-03-10 DIAGNOSIS — F418 Other specified anxiety disorders: Secondary | ICD-10-CM

## 2022-03-10 LAB — TSH: TSH: 9.63 u[IU]/mL — ABNORMAL HIGH (ref 0.35–5.50)

## 2022-03-10 LAB — T4, FREE: Free T4: 1.01 ng/dL (ref 0.60–1.60)

## 2022-03-21 ENCOUNTER — Other Ambulatory Visit: Payer: Self-pay | Admitting: Registered Nurse

## 2022-03-21 DIAGNOSIS — E559 Vitamin D deficiency, unspecified: Secondary | ICD-10-CM

## 2022-03-23 MED ORDER — SYNTHROID 150 MCG PO TABS: 150.0000 ug | ORAL_TABLET | Freq: Every day | ORAL | 3 refills | Status: DC

## 2022-03-23 NOTE — Addendum Note (Signed)
Addended by: Cinda Quest on: 03/23/2022 01:46 PM   Modules accepted: Orders

## 2022-03-23 NOTE — Progress Notes (Signed)
His thyroid level has now gone low.  Needs to now change his prescription to 150 mcg levothyroxine and take 1 tablet every day, please send new prescription

## 2022-03-31 ENCOUNTER — Ambulatory Visit (INDEPENDENT_AMBULATORY_CARE_PROVIDER_SITE_OTHER): Payer: 59 | Admitting: Registered Nurse

## 2022-03-31 ENCOUNTER — Encounter: Payer: Self-pay | Admitting: Registered Nurse

## 2022-03-31 ENCOUNTER — Other Ambulatory Visit: Payer: Self-pay

## 2022-03-31 VITALS — BP 126/88 | HR 72 | Temp 98.3°F | Resp 18 | Ht 69.0 in | Wt 203.2 lb

## 2022-03-31 DIAGNOSIS — J441 Chronic obstructive pulmonary disease with (acute) exacerbation: Secondary | ICD-10-CM | POA: Diagnosis not present

## 2022-03-31 DIAGNOSIS — Z125 Encounter for screening for malignant neoplasm of prostate: Secondary | ICD-10-CM

## 2022-03-31 DIAGNOSIS — Z0000 Encounter for general adult medical examination without abnormal findings: Secondary | ICD-10-CM | POA: Diagnosis not present

## 2022-03-31 DIAGNOSIS — I10 Essential (primary) hypertension: Secondary | ICD-10-CM | POA: Diagnosis not present

## 2022-03-31 DIAGNOSIS — E890 Postprocedural hypothyroidism: Secondary | ICD-10-CM | POA: Diagnosis not present

## 2022-03-31 DIAGNOSIS — E559 Vitamin D deficiency, unspecified: Secondary | ICD-10-CM

## 2022-03-31 LAB — COMPREHENSIVE METABOLIC PANEL
ALT: 13 U/L (ref 0–53)
AST: 11 U/L (ref 0–37)
Albumin: 4.6 g/dL (ref 3.5–5.2)
Alkaline Phosphatase: 74 U/L (ref 39–117)
BUN: 8 mg/dL (ref 6–23)
CO2: 31 mEq/L (ref 19–32)
Calcium: 9.4 mg/dL (ref 8.4–10.5)
Chloride: 96 mEq/L (ref 96–112)
Creatinine, Ser: 0.79 mg/dL (ref 0.40–1.50)
GFR: 94.68 mL/min (ref >60.00)
Glucose, Bld: 64 mg/dL — ABNORMAL LOW (ref 70–99)
Potassium: 4.1 mEq/L (ref 3.5–5.1)
Sodium: 134 mEq/L — ABNORMAL LOW (ref 135–145)
Total Bilirubin: 0.5 mg/dL (ref 0.2–1.2)
Total Protein: 6.8 g/dL (ref 6.0–8.3)

## 2022-03-31 LAB — LIPID PANEL
Cholesterol: 167 mg/dL (ref 0–200)
HDL: 52.7 mg/dL (ref >39.00)
LDL Cholesterol: 84 mg/dL (ref 0–99)
NonHDL: 114.54
Total CHOL/HDL Ratio: 3
Triglycerides: 155 mg/dL — ABNORMAL HIGH (ref 0.0–149.0)
VLDL: 31 mg/dL (ref 0.0–40.0)

## 2022-03-31 LAB — CBC WITH DIFFERENTIAL/PLATELET
Basophils Absolute: 0 10*3/uL (ref 0.0–0.1)
Basophils Relative: 0.3 % (ref 0.0–3.0)
Eosinophils Absolute: 0 10*3/uL (ref 0.0–0.7)
Eosinophils Relative: 0.3 % (ref 0.0–5.0)
HCT: 45.7 % (ref 39.0–52.0)
Hemoglobin: 15.5 g/dL (ref 13.0–17.0)
Lymphocytes Relative: 21.4 % (ref 12.0–46.0)
Lymphs Abs: 3 10*3/uL (ref 0.7–4.0)
MCHC: 34 g/dL (ref 30.0–36.0)
MCV: 91.3 fl (ref 78.0–100.0)
Monocytes Absolute: 1.2 10*3/uL — ABNORMAL HIGH (ref 0.1–1.0)
Monocytes Relative: 8.2 % (ref 3.0–12.0)
Neutro Abs: 9.9 10*3/uL — ABNORMAL HIGH (ref 1.4–7.7)
Neutrophils Relative %: 69.8 % (ref 43.0–77.0)
Platelets: 242 10*3/uL (ref 150.0–400.0)
RBC: 5.01 Mil/uL (ref 4.22–5.81)
RDW: 14.2 % (ref 11.5–15.5)
WBC: 14.1 10*3/uL — ABNORMAL HIGH (ref 4.0–10.5)

## 2022-03-31 LAB — URINALYSIS, ROUTINE W REFLEX MICROSCOPIC
Bilirubin Urine: NEGATIVE
Hgb urine dipstick: NEGATIVE
Ketones, ur: NEGATIVE
Leukocytes,Ua: NEGATIVE
Nitrite: NEGATIVE
RBC / HPF: NONE SEEN (ref 0–?)
Specific Gravity, Urine: <=1.005 — AB (ref 1.000–1.030)
Total Protein, Urine: NEGATIVE
Urine Glucose: NEGATIVE
Urobilinogen, UA: 0.2 (ref 0.0–1.0)
WBC, UA: NONE SEEN (ref 0–?)
pH: 6.5 (ref 5.0–8.0)

## 2022-03-31 LAB — PSA: PSA: 3.05 ng/mL (ref 0.10–4.00)

## 2022-03-31 LAB — VITAMIN D 25 HYDROXY (VIT D DEFICIENCY, FRACTURES): VITD: 38.63 ng/mL (ref 30.00–100.00)

## 2022-03-31 MED ORDER — AZITHROMYCIN 250 MG PO TABS: ORAL_TABLET | ORAL | 0 refills | Status: AC

## 2022-03-31 MED ORDER — PREDNISONE 10 MG (21) PO TBPK: ORAL_TABLET | ORAL | 0 refills | Status: DC

## 2022-03-31 NOTE — Progress Notes (Signed)
Complete physical exam  Patient: Jesse BRACKEN [name]   DOB: 03/31/1961   61 y.o. Male  MRN: [id number] Visit Date: 03/31/2022  Subjective:    Chief Complaint  Patient presents with   Annual Exam    Patient states he is here for an annual CPE.    Jesse MACLEOD [name] is a 61 y.o. male who presents today for a complete physical exam. He reports consuming a general diet.     He generally feels well. He reports sleeping well. He does not have additional problems to discuss today.   Vision:Within the last year Dental:Within Last 6 months STD Screen:No PSA:No  Follows with Dr. Dwyane Dee for thyroid disease.  Follows with cardiology for HTN, afib.  Congestion Onset 3-4 weeks ago Worsening. Sinus and chest congestion No shob, doe, chest pain  Most recent fall risk assessment:    03/31/2022    9:42 AM  Fall Risk   Falls in the past year? 0  Number falls in past yr: 0  Injury with Fall? 0  Risk for fall due to : No Fall Risks  Follow up Falls evaluation completed     Most recent depression screenings:    03/31/2022    9:42 AM 01/06/2022   11:45 AM  PHQ 2/9 Scores  PHQ - 2 Score 0 0  PHQ- 9 Score 0 2     Patient Active Problem List   Diagnosis Date Noted   Vitamin D deficiency 03/31/2022   COPD GOLD 0/ emphysema on CT  03/15/2019   Visit for preventive health examination 02/03/2018   Prostate cancer screening 02/03/2018   Colon cancer screening 02/03/2018   Need for Tdap vaccination 02/03/2018   PVC's (premature ventricular contractions) 01/06/2017   Bone neoplasm 02/25/2015   Cigarette smoker 08/07/2013   Depression with anxiety 02/20/2012   ED (erectile dysfunction) 02/20/2012   Hx of Bell's palsy 02/20/2012   Amputated toe ([place]) 02/20/2012   Paroxysmal atrial fibrillation ([place]) 01/15/2011   Hypothyroidism following radioiodine therapy 01/15/2011   Essential hypertension 01/15/2011   Dyslipidemia 01/15/2011   Past Medical History:  Diagnosis Date   Allergy     Anxiety    Atrial fibrillation (HCC)    Bell palsy    Depression    Diastolic heart failure    Hypercholesterolemia    Hypertension    Pneumonia    Thyroid disease    Past Surgical History:  Procedure Laterality Date   CHOLECYSTECTOMY     HERNIA REPAIR     KNEE SURGERY     VASECTOMY     Social History   Tobacco Use   Smoking status: Every Day    Packs/day: 0.50    Years: 40.00    Total pack years: 20.00    Types: Cigarettes    Start date: 09/14/1978   Smokeless tobacco: Never  Vaping Use   Vaping Use: Never used  Substance Use Topics   Alcohol use: Yes    Alcohol/week: 0.0 standard drinks of alcohol    Comment: rarely   Drug use: No   Social History   Socioeconomic History   Marital status: Married    Spouse name: Not on file   Number of children: 1   Years of education: Not on file   Highest education level: Not on file  Occupational History   Not on file  Tobacco Use   Smoking status: Every Day    Packs/day: 0.50    Years: 40.00  Total pack years: 20.00    Types: Cigarettes    Start date: 09/14/1978   Smokeless tobacco: Never  Vaping Use   Vaping Use: Never used  Substance and Sexual Activity   Alcohol use: Yes    Alcohol/week: 0.0 standard drinks of alcohol    Comment: rarely   Drug use: No   Sexual activity: Never    Birth control/protection: None  Other Topics Concern   Not on file  Social History Narrative   Marital status: married x 30+ years      Children:  2 children;(38,37); 3+3 grandchild      Lives:  With wife, son      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      Tobacco: 1 ppd x 30 years      Alcohol: rare     Regular exercise: walking daily   Caffeine use: daily      Social Determinants of Health   Financial Resource Strain: Not on file  Food Insecurity: Not on file  Transportation Needs: Not on file  Physical Activity: Not on file  Stress: Not on file  Social Connections: Not on file  Intimate Partner  Violence: Not on file   Family Status  Relation Name Status   Mother  Deceased at age 22       AMI   Father  Deceased at age 79       cirrhosis alcohol   Brother  Alive   MGM  Deceased   MGF  Deceased   PGM  Deceased   PGF  Deceased   Family History  Problem Relation Age of Onset   Heart disease Mother    Heart attack Mother    Other Mother        thyroid problems   Kidney cancer Mother    Cirrhosis Father    Other Father        thyroid problems   Alcohol abuse Father    Hypertension Father    Mental illness Brother    Parkinsonism Brother    Allergies  Allergen Reactions   Sulfa Drugs Cross Reactors Hives   Codeine Other (See Comments)    Made him very jittery    Hydrochlorothiazide Other (See Comments)    Hyponatremia     Patient Care Team: Maximiano Coss, NP as PCP - General (Adult Health Nurse Practitioner) Evans Lance, MD as PCP - Cardiology (Cardiology) [name], MD as Consulting Physician (Endocrinology) Evans Lance, MD as Consulting Physician (Cardiology) Carol Ada, MD as Consulting Physician (Gastroenterology)   Medications: Outpatient Medications Prior to Visit  Medication Sig   albuterol (PROVENTIL) (2.5 MG/3ML) 0.083% nebulizer solution Take 3 mLs (2.5 mg total) by nebulization every 6 (six) hours as needed for wheezing or shortness of breath.   albuterol (VENTOLIN HFA) 108 (90 Base) MCG/ACT inhaler INHALE 2 PUFFS INTO THE LUNGS EVERY 6 HOURS AS NEEDED FOR WHEEZING/SHORTNESS OF BREATH   aspirin 81 MG tablet Take 81 mg by mouth every other day.   azelastine (ASTELIN) 0.1 % nasal spray Place 1 spray into both nostrils 2 (two) times daily. Use in each nostril as directed   carvedilol (COREG) 12.5 MG tablet Take 1.5 tablets (18.75 mg total) by mouth 2 (two) times daily with a meal.   cloNIDine (CATAPRES - DOSED IN MG/24 HR) 0.1 mg/24hr patch PLACE 1 PATCH ONTO THE SKIN ONCE A WEEK   flecainide (TAMBOCOR) 100 MG tablet TAKE 1 TABLET BY  MOUTH  TWICE A DAY   hydrOXYzine (ATARAX) 10 MG tablet Take 0.5-1 tablets (5-10 mg total) by mouth 3 (three) times daily as needed.   lisinopril (ZESTRIL) 40 MG tablet Take 1 tablet (40 mg total) by mouth daily.   montelukast (SINGULAIR) 10 MG tablet Take 1 tablet (10 mg total) by mouth at bedtime.   nystatin (MYCOSTATIN/NYSTOP) powder Apply 1 application. topically 3 (three) times daily.   ondansetron (ZOFRAN ODT) 8 MG disintegrating tablet Take 1 tablet (8 mg total) by mouth every 8 (eight) hours as needed.   pantoprazole (PROTONIX) 40 MG tablet TAKE 1 TABLET BY MOUTH EVERY DAY   sertraline (ZOLOFT) 50 MG tablet TAKE 1 TABLET BY MOUTH EVERY DAY   simvastatin (ZOCOR) 40 MG tablet TAKE 1 TABLET BY MOUTH EVERY DAY   SYNTHROID 150 MCG tablet Take 1 tablet (150 mcg total) by mouth daily before breakfast.   tamsulosin (FLOMAX) 0.4 MG CAPS capsule TAKE 2 CAPSULES BY MOUTH EVERY DAY   Vitamin D, Ergocalciferol, (DRISDOL) 1.25 MG (50000 UNIT) CAPS capsule Take 1 capsule (50,000 Units total) by mouth every 7 (seven) days.   No facility-administered medications prior to visit.    Review of Systems  Constitutional: Negative.   HENT: Negative.    Eyes: Negative.   Respiratory:  Positive for cough. Negative for apnea, choking, chest tightness, shortness of breath, wheezing and stridor.   Cardiovascular: Negative.   Gastrointestinal: Negative.   Genitourinary: Negative.   Musculoskeletal: Negative.   Skin: Negative.   Neurological: Negative.   Psychiatric/Behavioral: Negative.    All other systems reviewed and are negative.   Last CBC Lab Results  Component Value Date   WBC 11.5 (H) 01/06/2022   HGB 15.2 01/06/2022   HCT 45.5 01/06/2022   MCV 91.1 01/06/2022   MCH 30.8 05/13/2021   RDW 13.9 01/06/2022   PLT 202.0 37/16/9678   Last metabolic panel Lab Results  Component Value Date   GLUCOSE 104 (H) 01/06/2022   NA 134 (L) 01/06/2022   K 4.6 01/06/2022   CL 98 01/06/2022   CO2 30  01/06/2022   BUN 15 01/06/2022   CREATININE 0.90 01/06/2022   GFRNONAA >60 05/13/2021   CALCIUM 9.4 01/06/2022   PROT 6.8 01/06/2022   ALBUMIN 4.4 01/06/2022   BILITOT 0.3 01/06/2022   ALKPHOS 92 01/06/2022   AST 19 01/06/2022   ALT 20 01/06/2022   ANIONGAP 10 05/13/2021   Last lipids Lab Results  Component Value Date   CHOL 185 06/26/2021   HDL 40.40 06/26/2021   LDLCALC 106 (H) 06/26/2021   LDLDIRECT 102.0 08/13/2020   TRIG 190.0 (H) 06/26/2021   CHOLHDL 5 06/26/2021   Last hemoglobin A1c Lab Results  Component Value Date   HGBA1C 5.9 01/06/2022   Last thyroid functions Lab Results  Component Value Date   TSH 9.63 (H) 03/09/2022   Last vitamin D Lab Results  Component Value Date   VD25OH 18.93 (L) 01/06/2022   Last vitamin B12 and Folate Lab Results  Component Value Date   VITAMINB12 416 01/06/2022   FOLATE 21.2 01/06/2022        Objective:     BP 126/88   Pulse 72   Temp 98.3 F (36.8 C) (Temporal)   Resp 18   Ht '5\' 9"'$  (1.753 m)   Wt 203 lb 3.2 oz (92.2 kg)   SpO2 97%   BMI 30.01 kg/m   BP Readings from Last 3 Encounters:  03/31/22 126/88  03/09/22 140/88  01/06/22  132/78   Wt Readings from Last 3 Encounters:  03/31/22 203 lb 3.2 oz (92.2 kg)  03/09/22 204 lb 6.4 oz (92.7 kg)  01/06/22 208 lb 9.6 oz (94.6 kg)   SpO2 Readings from Last 3 Encounters:  03/31/22 97%  03/09/22 94%  01/06/22 96%      Physical Exam Vitals and nursing note reviewed.  Constitutional:      General: He is not in acute distress.    Appearance: Normal appearance. He is obese. He is not ill-appearing, toxic-appearing or diaphoretic.  HENT:     Head: Normocephalic and atraumatic.     Right Ear: Tympanic membrane, ear canal and external ear normal. There is no impacted cerumen.     Left Ear: Tympanic membrane, ear canal and external ear normal. There is no impacted cerumen.     Nose: Nose normal. No congestion or rhinorrhea.     Mouth/Throat:     Mouth:  Mucous membranes are moist.     Pharynx: Oropharynx is clear. No oropharyngeal exudate or posterior oropharyngeal erythema.  Eyes:     General: No scleral icterus.       Right eye: No discharge.        Left eye: No discharge.     Extraocular Movements: Extraocular movements intact.     Conjunctiva/sclera: Conjunctivae normal.     Pupils: Pupils are equal, round, and reactive to light.  Neck:     Vascular: No carotid bruit.  Cardiovascular:     Rate and Rhythm: Normal rate and regular rhythm.     Pulses: Normal pulses.     Heart sounds: Normal heart sounds. No murmur heard.    No friction rub. No gallop.  Pulmonary:     Effort: Pulmonary effort is normal. No respiratory distress.     Breath sounds: Normal breath sounds. No stridor. No wheezing, rhonchi or rales.  Chest:     Chest wall: No tenderness.  Abdominal:     General: Abdomen is flat. Bowel sounds are normal. There is no distension.     Palpations: Abdomen is soft. There is no mass.     Tenderness: There is no abdominal tenderness. There is no right CVA tenderness, left CVA tenderness, guarding or rebound.     Hernia: No hernia is present.  Musculoskeletal:        General: No swelling, tenderness, deformity or signs of injury. Normal range of motion.     Cervical back: Normal range of motion and neck supple. No rigidity or tenderness.     Right lower leg: No edema.     Left lower leg: No edema.  Lymphadenopathy:     Cervical: No cervical adenopathy.  Skin:    General: Skin is warm and dry.     Capillary Refill: Capillary refill takes less than 2 seconds.     Coloration: Skin is not jaundiced or pale.     Findings: No bruising, erythema, lesion or rash.  Neurological:     General: No focal deficit present.     Mental Status: He is alert and oriented to person, place, and time. Mental status is at baseline.     Cranial Nerves: No cranial nerve deficit.     Sensory: No sensory deficit.     Motor: No weakness.      Coordination: Coordination normal.     Gait: Gait normal.     Deep Tendon Reflexes: Reflexes normal.  Psychiatric:        Mood and Affect: Mood  normal.        Behavior: Behavior normal.        Thought Content: Thought content normal.        Judgment: Judgment normal.      No results found for any visits on 03/31/22.    Assessment & Plan:    Routine Health Maintenance and Physical Exam  Immunization History  Administered Date(s) Administered   Influenza Inj Mdck Quad Pf 06/17/2017   Influenza, Quadrivalent, Recombinant, Inj, Pf 06/10/2020   Influenza,inj,Quad PF,6+ Mos 06/13/2013, 05/22/2016, 04/27/2018, 06/06/2019   Influenza-Unspecified 05/16/2015, 06/14/2017   PFIZER(Purple Top)SARS-COV-2 Vaccination 02/11/2020, 03/03/2020, 09/20/2020   Tdap 02/03/2018   Zoster Recombinat (Shingrix) 06/26/2021, 08/22/2021    Health Maintenance  Topic Date Due   COVID-19 Vaccine (4 - Booster for [place] series) 04/16/2022 (Originally 11/15/2020)   COLONOSCOPY (Pts 45-73yr Insurance coverage will need to be confirmed)  01/07/2023 (Originally 02/13/2019)   HIV Screening  01/07/2023 (Originally 12/11/1975)   INFLUENZA VACCINE  04/14/2022   TETANUS/TDAP  02/04/2028   Hepatitis C Screening  Completed   Zoster Vaccines- Shingrix  Completed   HPV VACCINES  Aged Out    Discussed health benefits of physical activity, and encouraged him to engage in regular exercise appropriate for his age and condition.  Problem List Items Addressed This Visit       Cardiovascular and Mediastinum   Essential hypertension   Relevant Medications   predniSONE (STERAPRED UNI-PAK 21 TAB) 10 MG (21) TBPK tablet   Other Relevant Orders   CBC with Differential/Platelet   Comprehensive metabolic panel   Lipid panel     Endocrine   Hypothyroidism following radioiodine therapy   Relevant Medications   predniSONE (STERAPRED UNI-PAK 21 TAB) 10 MG (21) TBPK tablet   Other Relevant Orders   CBC with  Differential/Platelet     Other   Vitamin D deficiency   Relevant Orders   Vitamin D (25 hydroxy)   Other Visit Diagnoses     Annual physical exam    -  Primary   Screening PSA (prostate specific antigen)       Relevant Orders   PSA   COPD exacerbation (HCC)       Relevant Medications   predniSONE (STERAPRED UNI-PAK 21 TAB) 10 MG (21) TBPK tablet   azithromycin (ZITHROMAX) 250 MG tablet   Other Relevant Orders   Urinalysis, Routine w reflex microscopic      Return in about 1 year (around 04/01/2023) for CPE and labs.    PLAN No distinct consolidation in lungs on exam. Will treat as copd exacerbation as above. Return precautions reviewed Otherwise exam unremarkable. Labs collected. Will follow up with the patient as warranted. Patient encouraged to call clinic with any questions, comments, or concerns.   RMaximiano Coss NP

## 2022-03-31 NOTE — Patient Instructions (Addendum)
Mr. Torrance -   Doristine Devoid to see you  Username: [id number] Password: [email or web address]  Check out these providers as a new PCP: Berniece Pap, MD Scarlette Calico, MD Dimas Chyle, MD Agustina Caroli, MD Myrna Blazer Early, NP Jeralyn Ruths, DNP  I will call if labs are worrisome  Thanks,  Rich   If you have lab work done today you will be contacted with your lab results within the next 2 weeks.  If you have not heard from [place] then please contact us. The fastest way to get your results is to register for My Chart.   IF you received an x-ray today, you will receive an invoice from [facility]. Please contact [facility] at [phone] with questions or concerns regarding your invoice.   IF you received labwork today, you will receive an invoice from [facility]. Please contact [facility] at [phone] with questions or concerns regarding your invoice.   Our billing staff will not be able to assist you with questions regarding bills from these companies.  You will be contacted with the lab results as soon as they are available. The fastest way to get your results is to activate your My Chart account. Instructions are located on the last page of this paperwork. If you have not heard from [place] regarding the results in 2 weeks, please contact this office.

## 2022-04-01 ENCOUNTER — Other Ambulatory Visit: Payer: Self-pay | Admitting: Registered Nurse

## 2022-04-01 DIAGNOSIS — R0981 Nasal congestion: Secondary | ICD-10-CM

## 2022-04-23 ENCOUNTER — Other Ambulatory Visit: Payer: Self-pay | Admitting: Registered Nurse

## 2022-04-23 DIAGNOSIS — N319 Neuromuscular dysfunction of bladder, unspecified: Secondary | ICD-10-CM

## 2022-04-23 DIAGNOSIS — J449 Chronic obstructive pulmonary disease, unspecified: Secondary | ICD-10-CM

## 2022-05-09 ENCOUNTER — Inpatient Hospital Stay (HOSPITAL_COMMUNITY)
Admission: EM | Admit: 2022-05-09 | Discharge: 2022-05-10 | DRG: 682 | Disposition: A | Discharge status: Home / Self Care | Payer: 59 | Attending: Internal Medicine | Admitting: Internal Medicine

## 2022-05-09 ENCOUNTER — Other Ambulatory Visit: Payer: Self-pay

## 2022-05-09 ENCOUNTER — Encounter (HOSPITAL_COMMUNITY): Payer: Self-pay

## 2022-05-09 DIAGNOSIS — I5032 Chronic diastolic (congestive) heart failure: Secondary | ICD-10-CM | POA: Diagnosis present

## 2022-05-09 DIAGNOSIS — J439 Emphysema, unspecified: Secondary | ICD-10-CM | POA: Diagnosis present

## 2022-05-09 DIAGNOSIS — Z79890 Hormone replacement therapy (postmenopausal): Secondary | ICD-10-CM | POA: Diagnosis not present

## 2022-05-09 DIAGNOSIS — I4891 Unspecified atrial fibrillation: Secondary | ICD-10-CM

## 2022-05-09 DIAGNOSIS — Z8614 Personal history of Methicillin resistant Staphylococcus aureus infection: Secondary | ICD-10-CM

## 2022-05-09 DIAGNOSIS — R296 Repeated falls: Secondary | ICD-10-CM | POA: Diagnosis present

## 2022-05-09 DIAGNOSIS — E7800 Pure hypercholesterolemia, unspecified: Secondary | ICD-10-CM | POA: Diagnosis present

## 2022-05-09 DIAGNOSIS — K219 Gastro-esophageal reflux disease without esophagitis: Secondary | ICD-10-CM | POA: Diagnosis present

## 2022-05-09 DIAGNOSIS — Z6829 Body mass index (BMI) 29.0-29.9, adult: Secondary | ICD-10-CM | POA: Diagnosis not present

## 2022-05-09 DIAGNOSIS — E890 Postprocedural hypothyroidism: Secondary | ICD-10-CM | POA: Diagnosis present

## 2022-05-09 DIAGNOSIS — D649 Anemia, unspecified: Secondary | ICD-10-CM | POA: Diagnosis present

## 2022-05-09 DIAGNOSIS — I110 Hypertensive heart disease with heart failure: Secondary | ICD-10-CM | POA: Diagnosis present

## 2022-05-09 DIAGNOSIS — F32A Depression, unspecified: Secondary | ICD-10-CM | POA: Diagnosis present

## 2022-05-09 DIAGNOSIS — Y842 Radiological procedure and radiotherapy as the cause of abnormal reaction of the patient, or of later complication, without mention of misadventure at the time of the procedure: Secondary | ICD-10-CM | POA: Diagnosis present

## 2022-05-09 DIAGNOSIS — Z8249 Family history of ischemic heart disease and other diseases of the circulatory system: Secondary | ICD-10-CM

## 2022-05-09 DIAGNOSIS — F17210 Nicotine dependence, cigarettes, uncomplicated: Secondary | ICD-10-CM | POA: Diagnosis present

## 2022-05-09 DIAGNOSIS — E871 Hypo-osmolality and hyponatremia: Secondary | ICD-10-CM | POA: Diagnosis present

## 2022-05-09 DIAGNOSIS — E860 Dehydration: Secondary | ICD-10-CM | POA: Diagnosis not present

## 2022-05-09 DIAGNOSIS — N400 Enlarged prostate without lower urinary tract symptoms: Secondary | ICD-10-CM | POA: Diagnosis present

## 2022-05-09 DIAGNOSIS — E663 Overweight: Secondary | ICD-10-CM | POA: Diagnosis present

## 2022-05-09 DIAGNOSIS — I480 Paroxysmal atrial fibrillation: Secondary | ICD-10-CM | POA: Diagnosis present

## 2022-05-09 DIAGNOSIS — N179 Acute kidney failure, unspecified: Principal | ICD-10-CM | POA: Diagnosis present

## 2022-05-09 DIAGNOSIS — I10 Essential (primary) hypertension: Secondary | ICD-10-CM | POA: Diagnosis present

## 2022-05-09 DIAGNOSIS — E039 Hypothyroidism, unspecified: Secondary | ICD-10-CM | POA: Diagnosis present

## 2022-05-09 DIAGNOSIS — Z818 Family history of other mental and behavioral disorders: Secondary | ICD-10-CM

## 2022-05-09 DIAGNOSIS — Z7982 Long term (current) use of aspirin: Secondary | ICD-10-CM

## 2022-05-09 DIAGNOSIS — I1A0 Resistant hypertension: Secondary | ICD-10-CM | POA: Diagnosis present

## 2022-05-09 DIAGNOSIS — Z882 Allergy status to sulfonamides status: Secondary | ICD-10-CM

## 2022-05-09 DIAGNOSIS — Z79899 Other long term (current) drug therapy: Secondary | ICD-10-CM

## 2022-05-09 DIAGNOSIS — Z885 Allergy status to narcotic agent status: Secondary | ICD-10-CM

## 2022-05-09 DIAGNOSIS — G4700 Insomnia, unspecified: Secondary | ICD-10-CM | POA: Diagnosis present

## 2022-05-09 DIAGNOSIS — J189 Pneumonia, unspecified organism: Secondary | ICD-10-CM | POA: Diagnosis not present

## 2022-05-09 DIAGNOSIS — Z888 Allergy status to other drugs, medicaments and biological substances status: Secondary | ICD-10-CM

## 2022-05-09 DIAGNOSIS — Z87828 Personal history of other (healed) physical injury and trauma: Secondary | ICD-10-CM | POA: Diagnosis not present

## 2022-05-09 DIAGNOSIS — E785 Hyperlipidemia, unspecified: Secondary | ICD-10-CM | POA: Diagnosis present

## 2022-05-09 DIAGNOSIS — F419 Anxiety disorder, unspecified: Secondary | ICD-10-CM | POA: Diagnosis present

## 2022-05-09 DIAGNOSIS — Z8051 Family history of malignant neoplasm of kidney: Secondary | ICD-10-CM

## 2022-05-09 DIAGNOSIS — J449 Chronic obstructive pulmonary disease, unspecified: Secondary | ICD-10-CM | POA: Diagnosis present

## 2022-05-09 DIAGNOSIS — F418 Other specified anxiety disorders: Secondary | ICD-10-CM | POA: Diagnosis present

## 2022-05-09 HISTORY — DX: Acute kidney failure, unspecified: N17.9

## 2022-05-09 LAB — CBC
HCT: 36.3 % — ABNORMAL LOW (ref 39.0–52.0)
HCT: 37.9 % — ABNORMAL LOW (ref 39.0–52.0)
Hemoglobin: 12.5 g/dL — ABNORMAL LOW (ref 13.0–17.0)
Hemoglobin: 12.8 g/dL — ABNORMAL LOW (ref 13.0–17.0)
MCH: 31.5 pg (ref 26.0–34.0)
MCH: 31.1 pg (ref 26.0–34.0)
MCHC: 34.4 g/dL (ref 30.0–36.0)
MCHC: 33.8 g/dL (ref 30.0–36.0)
MCV: 91.4 fL (ref 80.0–100.0)
MCV: 92 fL (ref 80.0–100.0)
Platelets: 230 10*3/uL (ref 150–400)
Platelets: 221 10*3/uL (ref 150–400)
RBC: 3.97 MIL/uL — ABNORMAL LOW (ref 4.22–5.81)
RBC: 4.12 MIL/uL — ABNORMAL LOW (ref 4.22–5.81)
RDW: 13.4 % (ref 11.5–15.5)
RDW: 13.4 % (ref 11.5–15.5)
WBC: 15.7 10*3/uL — ABNORMAL HIGH (ref 4.0–10.5)
WBC: 13.4 10*3/uL — ABNORMAL HIGH (ref 4.0–10.5)
nRBC: 0 % (ref 0.0–0.2)
nRBC: 0 % (ref 0.0–0.2)

## 2022-05-09 LAB — URINALYSIS, ROUTINE W REFLEX MICROSCOPIC
Bilirubin Urine: NEGATIVE
Glucose, UA: NEGATIVE mg/dL
Hgb urine dipstick: NEGATIVE
Ketones, ur: 5 mg/dL — AB
Leukocytes,Ua: NEGATIVE
Nitrite: NEGATIVE
Protein, ur: NEGATIVE mg/dL
Specific Gravity, Urine: 1.016 (ref 1.005–1.030)
pH: 5 (ref 5.0–8.0)

## 2022-05-09 LAB — CREATININE, SERUM
Creatinine, Ser: 1.77 mg/dL — ABNORMAL HIGH (ref 0.61–1.24)
GFR, Estimated: 43 mL/min — ABNORMAL LOW (ref >60)

## 2022-05-09 LAB — IRON AND TIBC
Iron: 28 ug/dL — ABNORMAL LOW (ref 45–182)
Saturation Ratios: 10 % — ABNORMAL LOW (ref 17.9–39.5)
TIBC: 290 ug/dL (ref 250–450)
UIBC: 262 ug/dL

## 2022-05-09 LAB — BASIC METABOLIC PANEL
Anion gap: 8 (ref 5–15)
BUN: 32 mg/dL — ABNORMAL HIGH (ref 8–23)
CO2: 22 mmol/L (ref 22–32)
Calcium: 8.4 mg/dL — ABNORMAL LOW (ref 8.9–10.3)
Chloride: 101 mmol/L (ref 98–111)
Creatinine, Ser: 2.68 mg/dL — ABNORMAL HIGH (ref 0.61–1.24)
GFR, Estimated: 26 mL/min — ABNORMAL LOW (ref >60)
Glucose, Bld: 119 mg/dL — ABNORMAL HIGH (ref 70–99)
Potassium: 3.9 mmol/L (ref 3.5–5.1)
Sodium: 131 mmol/L — ABNORMAL LOW (ref 135–145)

## 2022-05-09 LAB — FERRITIN: Ferritin: 114 ng/mL (ref 24–336)

## 2022-05-09 LAB — TSH: TSH: 0.338 u[IU]/mL — ABNORMAL LOW (ref 0.350–4.500)

## 2022-05-09 LAB — RETICULOCYTES
Immature Retic Fract: 2.8 % (ref 2.3–15.9)
RBC.: 4 MIL/uL — ABNORMAL LOW (ref 4.22–5.81)
Retic Count, Absolute: 54.4 10*3/uL (ref 19.0–186.0)
Retic Ct Pct: 1.4 % (ref 0.4–3.1)

## 2022-05-09 LAB — FOLATE: Folate: 9.2 ng/mL (ref >5.9)

## 2022-05-09 LAB — TROPONIN I (HIGH SENSITIVITY): Troponin I (High Sensitivity): 4 ng/L (ref <18)

## 2022-05-09 LAB — VITAMIN B12: Vitamin B-12: 241 pg/mL (ref 180–914)

## 2022-05-09 MED ORDER — ASPIRIN 81 MG PO TBEC
81.0000 mg | DELAYED_RELEASE_TABLET | ORAL | Status: DC
  Administered 2022-05-09: 81 mg via ORAL
  Filled 2022-05-09: qty 1

## 2022-05-09 MED ORDER — ACETAMINOPHEN 650 MG RE SUPP: 650.0000 mg | Freq: Four times a day (QID) | RECTAL | Status: DC | PRN

## 2022-05-09 MED ORDER — SODIUM CHLORIDE 0.9 % IV SOLN: INTRAVENOUS | Status: DC

## 2022-05-09 MED ORDER — TAMSULOSIN HCL 0.4 MG PO CAPS
0.8000 mg | ORAL_CAPSULE | Freq: Every day | ORAL | Status: DC
  Administered 2022-05-10: 0.8 mg via ORAL
  Filled 2022-05-09: qty 2

## 2022-05-09 MED ORDER — AZELASTINE HCL 0.1 % NA SOLN: 1.0000 | Freq: Two times a day (BID) | NASAL | Status: DC

## 2022-05-09 MED ORDER — CLONIDINE HCL 0.1 MG/24HR TD PTWK
0.1000 mg | MEDICATED_PATCH | TRANSDERMAL | Status: DC
  Filled 2022-05-09: qty 1

## 2022-05-09 MED ORDER — ALPRAZOLAM 0.25 MG PO TABS
0.2500 mg | ORAL_TABLET | Freq: Every day | ORAL | Status: DC
Start: 2022-05-09 — End: 2022-05-10
  Administered 2022-05-09: 0.25 mg via ORAL
  Filled 2022-05-09: qty 1

## 2022-05-09 MED ORDER — SODIUM CHLORIDE 0.9 % IV BOLUS
500.0000 mL | Freq: Once | INTRAVENOUS | Status: AC
  Administered 2022-05-09: 500 mL via INTRAVENOUS

## 2022-05-09 MED ORDER — FLECAINIDE ACETATE 50 MG PO TABS
100.0000 mg | ORAL_TABLET | Freq: Two times a day (BID) | ORAL | Status: DC
  Administered 2022-05-09 – 2022-05-10 (×2): 100 mg via ORAL
  Filled 2022-05-09 (×2): qty 2

## 2022-05-09 MED ORDER — HEPARIN SODIUM (PORCINE) 5000 UNIT/ML IJ SOLN
5000.0000 [IU] | Freq: Three times a day (TID) | INTRAMUSCULAR | Status: DC
  Administered 2022-05-09 – 2022-05-10 (×2): 5000 [IU] via SUBCUTANEOUS
  Filled 2022-05-09 (×2): qty 1

## 2022-05-09 MED ORDER — METOPROLOL TARTRATE 5 MG/5ML IV SOLN: 5.0000 mg | Freq: Four times a day (QID) | INTRAVENOUS | Status: DC | PRN

## 2022-05-09 MED ORDER — SODIUM CHLORIDE 0.9 % IV BOLUS
1000.0000 mL | Freq: Once | INTRAVENOUS | Status: AC
  Administered 2022-05-09: 1000 mL via INTRAVENOUS

## 2022-05-09 MED ORDER — ACETAMINOPHEN 325 MG PO TABS: 650.0000 mg | ORAL_TABLET | Freq: Four times a day (QID) | ORAL | Status: DC | PRN

## 2022-05-09 MED ORDER — MONTELUKAST SODIUM 10 MG PO TABS
10.0000 mg | ORAL_TABLET | Freq: Every day | ORAL | Status: DC
  Administered 2022-05-09: 10 mg via ORAL
  Filled 2022-05-09: qty 1

## 2022-05-09 MED ORDER — PANTOPRAZOLE SODIUM 40 MG PO TBEC
40.0000 mg | DELAYED_RELEASE_TABLET | Freq: Every day | ORAL | Status: DC
  Administered 2022-05-09 – 2022-05-10 (×2): 40 mg via ORAL
  Filled 2022-05-09 (×2): qty 1

## 2022-05-09 MED ORDER — ONDANSETRON HCL 4 MG PO TABS: 4.0000 mg | ORAL_TABLET | Freq: Four times a day (QID) | ORAL | Status: DC | PRN

## 2022-05-09 MED ORDER — LEVALBUTEROL HCL 0.63 MG/3ML IN NEBU: 0.6300 mg | INHALATION_SOLUTION | Freq: Four times a day (QID) | RESPIRATORY_TRACT | Status: DC | PRN

## 2022-05-09 MED ORDER — SERTRALINE HCL 50 MG PO TABS: 50.0000 mg | ORAL_TABLET | Freq: Every day | ORAL | Status: DC

## 2022-05-09 MED ORDER — ONDANSETRON HCL 4 MG/2ML IJ SOLN: 4.0000 mg | Freq: Four times a day (QID) | INTRAMUSCULAR | Status: DC | PRN

## 2022-05-09 MED ORDER — CARVEDILOL 3.125 MG PO TABS
18.7500 mg | ORAL_TABLET | Freq: Two times a day (BID) | ORAL | Status: DC
  Administered 2022-05-09 – 2022-05-10 (×2): 18.75 mg via ORAL
  Filled 2022-05-09 (×2): qty 2

## 2022-05-09 NOTE — ED Notes (Signed)
Changed bed of pt after incontinent episode.

## 2022-05-09 NOTE — H&P (Addendum)
History and Physical    Jesse Suarez WNI:[id number] DOB: 23-Dec-1960 DOA: 05/09/2022  PCP: Pcp, No   Patient coming from: Home  Chief Complaint: Generalized weakness  HPI: Jesse Suarez [name] is a 61 y.o. male with medical history significant for paroxysmal atrial fibrillation, chronic diastolic CHF, hypertension, hypothyroidism, dyslipidemia, and BPH who presented to the ED with complaints of generalized weakness over the last several days with multiple falls, but no sustained injuries.  He has had poor oral intake, but appears to have been taking his medications regularly.  He has decreased appetite and has been drinking mostly [facility].  He is not on any blood thinners due to prior history of head trauma.  He denies any chest pain, shortness of breath, fevers, chills, nausea, vomiting, abdominal pain, or diarrhea.  He denies any dysuria as well.  His heart rates were noted to be minimally elevated but he denies any palpitations.  He apparently just completed a 14-day course of oral vancomycin for right elbow MRSA infection and states that his right elbow has improved clinically.   ED Course: Vital signs with some mildly elevated heart rates, but otherwise rate controlled on EKG with atrial fibrillation.  Leukocytosis 15,700 and sodium 131 with hemoglobin 12.5.  Urine analysis negative for UTI.  BUN 32 and creatinine 2.68 with TSH 0.338.  He has been given a 1 L fluid bolus.  Review of Systems: Reviewed as noted above, otherwise negative.  Past Medical History:  Diagnosis Date   Allergy    Anxiety    Atrial fibrillation ([place])    Bell palsy    Depression    Diastolic heart failure    Hypercholesterolemia    Hypertension    Pneumonia    Thyroid disease     Past Surgical History:  Procedure Laterality Date   CHOLECYSTECTOMY     HERNIA REPAIR     KNEE SURGERY     VASECTOMY       reports that he has been smoking cigarettes. He started smoking about 43 years ago. He has  a 20.00 pack-year smoking history. He has never used smokeless tobacco. He reports current alcohol use. He reports that he does not use drugs.  Allergies  Allergen Reactions   Sulfa Drugs Cross Reactors Hives   Codeine Other (See Comments)    Made him very jittery    Hydrochlorothiazide Other (See Comments)    Hyponatremia    Family History  Problem Relation Age of Onset   Heart disease Mother    Heart attack Mother    Other Mother        thyroid problems   Kidney cancer Mother    Cirrhosis Father    Other Father        thyroid problems   Alcohol abuse Father    Hypertension Father    Mental illness Brother    Parkinsonism Brother     Prior to Admission medications   Medication Sig Start Date End Date Taking? Authorizing Provider  albuterol (PROVENTIL) (2.5 MG/3ML) 0.083% nebulizer solution Take 3 mLs (2.5 mg total) by nebulization every 6 (six) hours as needed for wheezing or shortness of breath. 08/17/18   Brunetta Jeans, PA-C  albuterol (VENTOLIN HFA) 108 (90 Base) MCG/ACT inhaler INHALE 2 PUFFS INTO THE LUNGS EVERY 6 HOURS AS NEEDED FOR WHEEZING/SHORTNESS OF BREATH 04/24/22   Wendie Agreste, MD  aspirin 81 MG tablet Take 81 mg by mouth every other day.    [provider]  azelastine (ASTELIN) 0.1 % nasal spray Place 1 spray into both nostrils 2 (two) times daily. Use in each nostril as directed 06/26/21   Maximiano Coss, NP  carvedilol (COREG) 12.5 MG tablet Take 1.5 tablets (18.75 mg total) by mouth 2 (two) times daily with a meal. 11/06/21   Baldwin Jamaica, PA-C  cloNIDine (CATAPRES - DOSED IN MG/24 HR) 0.1 mg/24hr patch PLACE 1 PATCH ONTO THE SKIN ONCE A WEEK 02/04/22   [name], MD  flecainide (TAMBOCOR) 100 MG tablet TAKE 1 TABLET BY MOUTH TWICE A DAY 10/10/21   Evans Lance, MD  hydrOXYzine (ATARAX) 10 MG tablet Take 0.5-1 tablets (5-10 mg total) by mouth 3 (three) times daily as needed. 02/05/22   Maximiano Coss, NP  lisinopril (ZESTRIL) 40 MG  tablet Take 1 tablet (40 mg total) by mouth daily. 11/06/21   Baldwin Jamaica, PA-C  montelukast (SINGULAIR) 10 MG tablet TAKE 1 TABLET BY MOUTH EVERYDAY AT BEDTIME 04/01/22   Maximiano Coss, NP  nystatin (MYCOSTATIN/NYSTOP) powder Apply 1 application. topically 3 (three) times daily. 01/06/22   Maximiano Coss, NP  ondansetron (ZOFRAN ODT) 8 MG disintegrating tablet Take 1 tablet (8 mg total) by mouth every 8 (eight) hours as needed. 01/06/22   Maximiano Coss, NP  pantoprazole (PROTONIX) 40 MG tablet TAKE 1 TABLET BY MOUTH EVERY DAY 11/06/21   Maximiano Coss, NP  predniSONE (STERAPRED UNI-PAK 21 TAB) 10 MG (21) TBPK tablet Take per package instructions. Do not skip doses. Finish entire supply. 03/31/22   Maximiano Coss, NP  sertraline (ZOLOFT) 50 MG tablet TAKE 1 TABLET BY MOUTH EVERY DAY 03/10/22   Maximiano Coss, NP  simvastatin (ZOCOR) 40 MG tablet TAKE 1 TABLET BY MOUTH EVERY DAY 01/05/22   Maximiano Coss, NP  SYNTHROID 150 MCG tablet Take 1 tablet (150 mcg total) by mouth daily before breakfast. 03/23/22   [name], MD  tamsulosin (FLOMAX) 0.4 MG CAPS capsule TAKE 2 CAPSULES BY MOUTH EVERY DAY 04/24/22   Wendie Agreste, MD  Vitamin D, Ergocalciferol, (DRISDOL) 1.25 MG (50000 UNIT) CAPS capsule Take 1 capsule (50,000 Units total) by mouth every 7 (seven) days. 01/06/22   Maximiano Coss, NP    Physical Exam: Vitals:   05/09/22 1438 05/09/22 1439 05/09/22 1440 05/09/22 1441  BP:   102/84   Pulse: 88 90 73 (!) 120  Resp:      Temp:      TempSrc:      SpO2: 96% 96% 95% 95%  Weight:      Height:        Constitutional: NAD, calm, comfortable Vitals:   05/09/22 1438 05/09/22 1439 05/09/22 1440 05/09/22 1441  BP:   102/84   Pulse: 88 90 73 (!) 120  Resp:      Temp:      TempSrc:      SpO2: 96% 96% 95% 95%  Weight:      Height:       Eyes: lids and conjunctivae normal Neck: normal, supple Respiratory: clear to auscultation bilaterally. Normal respiratory effort. No accessory  muscle use.  Cardiovascular: Regular rate and rhythm, no murmurs. Abdomen: no tenderness, no distention. Bowel sounds positive.  Musculoskeletal:  No edema. Skin: no rashes, lesions, ulcers.  Psychiatric: Flat affect  Labs on Admission: I have personally reviewed following labs and imaging studies  CBC: Recent Labs  Lab 05/09/22 1035  WBC 15.7*  HGB 12.5*  HCT 36.3*  MCV 91.4  PLT 230  Basic Metabolic Panel: Recent Labs  Lab 05/09/22 1035  NA 131*  K 3.9  CL 101  CO2 22  GLUCOSE 119*  BUN 32*  CREATININE 2.68*  CALCIUM 8.4*   GFR: Estimated Creatinine Clearance: 31.4 mL/min (A) (by C-G formula based on SCr of 2.68 mg/dL (H)). Liver Function Tests: No results for input(s): "AST", "ALT", "ALKPHOS", "BILITOT", "PROT", "ALBUMIN" in the last 168 hours. No results for input(s): "LIPASE", "AMYLASE" in the last 168 hours. No results for input(s): "AMMONIA" in the last 168 hours. Coagulation Profile: No results for input(s): "INR", "PROTIME" in the last 168 hours. Cardiac Enzymes: No results for input(s): "CKTOTAL", "CKMB", "CKMBINDEX", "TROPONINI" in the last 168 hours. BNP (last 3 results) No results for input(s): "PROBNP" in the last 8760 hours. HbA1C: No results for input(s): "HGBA1C" in the last 72 hours. CBG: No results for input(s): "GLUCAP" in the last 168 hours. Lipid Profile: No results for input(s): "CHOL", "HDL", "LDLCALC", "TRIG", "CHOLHDL", "LDLDIRECT" in the last 72 hours. Thyroid Function Tests: Recent Labs    05/09/22 1035  TSH 0.338*   Anemia Panel: No results for input(s): "VITAMINB12", "FOLATE", "FERRITIN", "TIBC", "IRON", "RETICCTPCT" in the last 72 hours. Urine analysis:    Component Value Date/Time   COLORURINE YELLOW 05/09/2022 1158   APPEARANCEUR HAZY (A) 05/09/2022 1158   LABSPEC 1.016 05/09/2022 1158   PHURINE 5.0 05/09/2022 1158   GLUCOSEU NEGATIVE 05/09/2022 1158   GLUCOSEU NEGATIVE 03/31/2022 1030   HGBUR NEGATIVE 05/09/2022  1158   BILIRUBINUR NEGATIVE 05/09/2022 1158   BILIRUBINUR negative 05/22/2016 1323   BILIRUBINUR neg 03/22/2015 1017   KETONESUR 5 (A) 05/09/2022 1158   PROTEINUR NEGATIVE 05/09/2022 1158   UROBILINOGEN 0.2 03/31/2022 1030   NITRITE NEGATIVE 05/09/2022 1158   LEUKOCYTESUR NEGATIVE 05/09/2022 1158    Radiological Exams on Admission: No results found.  EKG: Independently reviewed. Afib 97bpm.  Assessment/Plan Principal Problem:   AKI (acute kidney injury) ([place]) Active Problems:   Paroxysmal atrial fibrillation ([facility])   Hypothyroidism following radioiodine therapy   Essential hypertension   Dyslipidemia   Depression with anxiety   COPD GOLD 0/ emphysema on CT     AKI Likely prerenal in the setting of poor oral intake Continue IV fluid hydration and monitor closely Check repeat labs in a.m. Avoid nephrotoxic agents  Mild hyponatremia Likely due to dehydration Continue to monitor on NS  Anemia Check anemia panel No overt bleeding noted  Paroxysmal atrial fibrillation Continue to monitor on telemetry Continue home flecainide and Coreg Metoprolol IV as needed for rate control Not on anticoagulation due to recurrent falls  Chronic diastolic CHF No prior echo noted in system Continue to monitor  Hypothyroidism Continue Synthroid TSH 0.338 Check free T4  Dyslipidemia Hold statin and check CK levels given recent falls  Recurrent falls Fall precautions PT/OT evaluation  Hypertension Continue home medications except for lisinopril  GERD Continue Protonix  BPH Continue Flomax  Anxiety/depression Continue home medications  COPD with ongoing tobacco abuse Counseled on cessation No acute bronchospasms noted, DuoNebs as needed  Overweight BMI 29.53 Lifestyle changes outpatient  Insomnia Trial of Xanax overnight   DVT prophylaxis: Heparin Code Status: Full Family Communication: None at bedside Disposition Plan:Admit for AKI treatment Consults  called:None Admission status: Inpatient, Tele  Severity of Illness: The appropriate patient status for this patient is INPATIENT. Inpatient status is judged to be reasonable and necessary in order to provide the required intensity of service to ensure the patient's safety. The patient's presenting symptoms, physical  exam findings, and initial radiographic and laboratory data in the context of their chronic comorbidities is felt to place them at high risk for further clinical deterioration. Furthermore, it is not anticipated that the patient will be medically stable for discharge from the hospital within 2 midnights of admission.   * I certify that at the point of admission it is my clinical judgment that the patient will require inpatient hospital care spanning beyond 2 midnights from the point of admission due to high intensity of service, high risk for further deterioration and high frequency of surveillance required.*    D  DO [facility]  If 7PM-7AM, please contact night-coverage [email or web address]  05/09/2022, 2:55 PM

## 2022-05-09 NOTE — ED Provider Notes (Signed)
[facility] EMERGENCY DEPARTMENT Provider Note   CSN: [id number] Arrival date & time: 05/09/22  1009     History  Chief Complaint  Patient presents with   Weakness    Jesse MELESKI [name] is a 61 y.o. male.  Patient is a 60 yo male with PMH afib, diastolic heart failure, and thyroid disease presenting for weakness. Pt admits to generalized weakness x several days. States he will feel find then have significant generalized weakness that causes him to fall. Last fall was this morning. NO blood thinner use. Denies head trauma.  Denies chest pain or sob. Denies fevers, chills, nausea, vomiting, abdominal pain, or diarrhea. Denies hx of dysuria. Pt does have hx of afib and presents in afib today with a rate 110-92 bpm. States he has been compliant on his carvedilol and flecidine. States he just completed a 14 day course of oral vancomycin for right elbow MRSA infection. States swelling, redness, and pain in elbow has improved significantly.   The history is provided by the patient. No language interpreter was used.  Weakness Associated symptoms: no abdominal pain, no arthralgias, no chest pain, no cough, no dysuria, no fever, no seizures, no shortness of breath and no vomiting        Home Medications Prior to Admission medications   Medication Sig Start Date End Date Taking? Authorizing Provider  albuterol (PROVENTIL) (2.5 MG/3ML) 0.083% nebulizer solution Take 3 mLs (2.5 mg total) by nebulization every 6 (six) hours as needed for wheezing or shortness of breath. 08/17/18   Brunetta Jeans, PA-C  albuterol (VENTOLIN HFA) 108 (90 Base) MCG/ACT inhaler INHALE 2 PUFFS INTO THE LUNGS EVERY 6 HOURS AS NEEDED FOR WHEEZING/SHORTNESS OF BREATH 04/24/22   Wendie Agreste, MD  aspirin 81 MG tablet Take 81 mg by mouth every other day.    [provider]  azelastine (ASTELIN) 0.1 % nasal spray Place 1 spray into both nostrils 2 (two) times daily. Use in each nostril as directed 06/26/21    Maximiano Coss, NP  carvedilol (COREG) 12.5 MG tablet Take 1.5 tablets (18.75 mg total) by mouth 2 (two) times daily with a meal. 11/06/21   Baldwin Jamaica, PA-C  cloNIDine (CATAPRES - DOSED IN MG/24 HR) 0.1 mg/24hr patch PLACE 1 PATCH ONTO THE SKIN ONCE A WEEK 02/04/22   [name], MD  flecainide (TAMBOCOR) 100 MG tablet TAKE 1 TABLET BY MOUTH TWICE A DAY 10/10/21   Evans Lance, MD  hydrOXYzine (ATARAX) 10 MG tablet Take 0.5-1 tablets (5-10 mg total) by mouth 3 (three) times daily as needed. 02/05/22   Maximiano Coss, NP  lisinopril (ZESTRIL) 40 MG tablet Take 1 tablet (40 mg total) by mouth daily. 11/06/21   Baldwin Jamaica, PA-C  montelukast (SINGULAIR) 10 MG tablet TAKE 1 TABLET BY MOUTH EVERYDAY AT BEDTIME 04/01/22   Maximiano Coss, NP  nystatin (MYCOSTATIN/NYSTOP) powder Apply 1 application. topically 3 (three) times daily. 01/06/22   Maximiano Coss, NP  ondansetron (ZOFRAN ODT) 8 MG disintegrating tablet Take 1 tablet (8 mg total) by mouth every 8 (eight) hours as needed. 01/06/22   Maximiano Coss, NP  pantoprazole (PROTONIX) 40 MG tablet TAKE 1 TABLET BY MOUTH EVERY DAY 11/06/21   Maximiano Coss, NP  predniSONE (STERAPRED UNI-PAK 21 TAB) 10 MG (21) TBPK tablet Take per package instructions. Do not skip doses. Finish entire supply. 03/31/22   Maximiano Coss, NP  sertraline (ZOLOFT) 50 MG tablet TAKE 1 TABLET BY MOUTH EVERY DAY 03/10/22  Maximiano Coss, NP  simvastatin (ZOCOR) 40 MG tablet TAKE 1 TABLET BY MOUTH EVERY DAY 01/05/22   Maximiano Coss, NP  SYNTHROID 150 MCG tablet Take 1 tablet (150 mcg total) by mouth daily before breakfast. 03/23/22   [name], MD  tamsulosin (FLOMAX) 0.4 MG CAPS capsule TAKE 2 CAPSULES BY MOUTH EVERY DAY 04/24/22   Wendie Agreste, MD  Vitamin D, Ergocalciferol, (DRISDOL) 1.25 MG (50000 UNIT) CAPS capsule Take 1 capsule (50,000 Units total) by mouth every 7 (seven) days. 01/06/22   Maximiano Coss, NP      Allergies    Sulfa drugs cross reactors,  Codeine, and Hydrochlorothiazide    Review of Systems   Review of Systems  Constitutional:  Negative for chills and fever.  HENT:  Negative for ear pain and sore throat.   Eyes:  Negative for pain and visual disturbance.  Respiratory:  Negative for cough and shortness of breath.   Cardiovascular:  Negative for chest pain and palpitations.  Gastrointestinal:  Negative for abdominal pain and vomiting.  Genitourinary:  Negative for dysuria and hematuria.  Musculoskeletal:  Negative for arthralgias and back pain.  Skin:  Negative for color change and rash.  Neurological:  Positive for weakness. Negative for seizures and syncope.  All other systems reviewed and are negative.   Physical Exam Updated Vital Signs BP 102/84   Pulse (!) 120   Temp 97.8 F (36.6 C)   Resp 19   Ht '5\' 9"'$  (1.753 m)   Wt 90.7 kg   SpO2 95%   BMI 29.53 kg/m  Physical Exam Vitals and nursing note reviewed.  Constitutional:      General: He is not in acute distress.    Appearance: He is well-developed.  HENT:     Head: Normocephalic and atraumatic.  Eyes:     Conjunctiva/sclera: Conjunctivae normal.  Cardiovascular:     Rate and Rhythm: Normal rate and regular rhythm.     Heart sounds: No murmur heard. Pulmonary:     Effort: Pulmonary effort is normal. No respiratory distress.     Breath sounds: Normal breath sounds.  Abdominal:     Palpations: Abdomen is soft.     Tenderness: There is no abdominal tenderness.  Musculoskeletal:        General: No swelling.     Cervical back: Neck supple. No bony tenderness.     Thoracic back: No bony tenderness.     Lumbar back: No bony tenderness.  Skin:    General: Skin is warm and dry.     Capillary Refill: Capillary refill takes less than 2 seconds.  Neurological:     Mental Status: He is alert.     GCS: GCS eye subscore is 4. GCS verbal subscore is 5. GCS motor subscore is 6.     Cranial Nerves: Cranial nerves 2-12 are intact.     Sensory: Sensation is  intact.     Motor: Motor function is intact.     Coordination: Coordination is intact.  Psychiatric:        Mood and Affect: Mood normal.     ED Results / Procedures / Treatments   Labs (all labs ordered are listed, but only abnormal results are displayed) Labs Reviewed  BASIC METABOLIC PANEL - Abnormal; Notable for the following components:      Result Value   Sodium 131 (*)    Glucose, Bld 119 (*)    BUN 32 (*)    Creatinine, Ser 2.68 (*)  Calcium 8.4 (*)    GFR, Estimated 26 (*)    All other components within normal limits  CBC - Abnormal; Notable for the following components:   WBC 15.7 (*)    RBC 3.97 (*)    Hemoglobin 12.5 (*)    HCT 36.3 (*)    All other components within normal limits  URINALYSIS, ROUTINE W REFLEX MICROSCOPIC - Abnormal; Notable for the following components:   APPearance HAZY (*)    Ketones, ur 5 (*)    All other components within normal limits  TSH - Abnormal; Notable for the following components:   TSH 0.338 (*)    All other components within normal limits  T4, FREE  CBG MONITORING, ED  TROPONIN I (HIGH SENSITIVITY)    EKG None  Radiology No results found.  Procedures Procedures    Medications Ordered in ED Medications  sodium chloride 0.9 % bolus 1,000 mL (0 mLs Intravenous Stopped 05/09/22 1352)    ED Course/ Medical Decision Making/ A&P                           Medical Decision Making Amount and/or Complexity of Data Reviewed Labs: ordered.   61 yo male with PMH afib, diastolic heart failure, and thyroid disease presenting for weakness. Pt admits to generalized weakness x several days.  Is alert oriented x3, a good historian, GCS of 15, no signs of trauma, neurovascularly intact.  Afebrile with A-fib on a twelve-lead and on telemetry with a rate ranging from 115 to 95 bpm.  He has a known history of A-fib and is currently on flecainide and carvedilol.  EKG otherwise stable with no ST segment elevation or depression.   Stable troponins.  Stable electrolytes.  He admits to multiple falls over the last month, decreased p.o. take secondary to decreased appetite stating that he just drinks [facility] throughout the day but does not drink water or eat food.  Laboratory studies concerning for new AKI with a creatinine of 2.68 with previously normal renal function.  Urine analysis demonstrates no urinary tract infection.  No hematuria.  Likely secondary to dehydration. -No hypoxia -No hypoglycemia -Thyroid disease.  TSH abnormal.  Reflex T4 pending  Recommended for admission for dehydration, new AKI.  Symptoms possibly related to A-fib however rate is stable at this time.  Patient states he has been compliant with his home medications of flecainide and carvedilol.  I spoke with admitting physician who agrees to accept patient.         Final Clinical Impression(s) / ED Diagnoses Final diagnoses:  AKI (acute kidney injury) ([place])  Dehydration  Atrial fibrillation, unspecified type [facility])    Rx / DC Orders ED Discharge Orders     None         Lianne Cure, DO 74/25/95 1449

## 2022-05-09 NOTE — ED Notes (Signed)
Pt hypotensive, hospitalist paged, awaiting return call

## 2022-05-09 NOTE — ED Triage Notes (Signed)
Came in by EMS Hx of proximal Afib; fain for about a week with bouts of nausea and hot flashes and has no appetite. Hx of MRSA in right arm.

## 2022-05-10 ENCOUNTER — Inpatient Hospital Stay (HOSPITAL_COMMUNITY): Payer: 59

## 2022-05-10 DIAGNOSIS — N179 Acute kidney failure, unspecified: Secondary | ICD-10-CM | POA: Diagnosis not present

## 2022-05-10 LAB — BASIC METABOLIC PANEL
Anion gap: 5 (ref 5–15)
BUN: 18 mg/dL (ref 8–23)
CO2: 21 mmol/L — ABNORMAL LOW (ref 22–32)
Calcium: 8.2 mg/dL — ABNORMAL LOW (ref 8.9–10.3)
Chloride: 106 mmol/L (ref 98–111)
Creatinine, Ser: 0.79 mg/dL (ref 0.61–1.24)
GFR, Estimated: >60 mL/min (ref >60)
Glucose, Bld: 113 mg/dL — ABNORMAL HIGH (ref 70–99)
Potassium: 4.3 mmol/L (ref 3.5–5.1)
Sodium: 132 mmol/L — ABNORMAL LOW (ref 135–145)

## 2022-05-10 LAB — CBC
HCT: 35.8 % — ABNORMAL LOW (ref 39.0–52.0)
Hemoglobin: 12.1 g/dL — ABNORMAL LOW (ref 13.0–17.0)
MCH: 31 pg (ref 26.0–34.0)
MCHC: 33.8 g/dL (ref 30.0–36.0)
MCV: 91.8 fL (ref 80.0–100.0)
Platelets: 222 10*3/uL (ref 150–400)
RBC: 3.9 MIL/uL — ABNORMAL LOW (ref 4.22–5.81)
RDW: 13.7 % (ref 11.5–15.5)
WBC: 11.6 10*3/uL — ABNORMAL HIGH (ref 4.0–10.5)
nRBC: 0 % (ref 0.0–0.2)

## 2022-05-10 LAB — MAGNESIUM: Magnesium: 1.8 mg/dL (ref 1.7–2.4)

## 2022-05-10 LAB — T4, FREE: Free T4: 1.56 ng/dL — ABNORMAL HIGH (ref 0.61–1.12)

## 2022-05-10 LAB — HIV ANTIBODY (ROUTINE TESTING W REFLEX): HIV Screen 4th Generation wRfx: NONREACTIVE

## 2022-05-10 MED ORDER — LEVOTHYROXINE SODIUM 125 MCG PO TABS: 125.0000 ug | ORAL_TABLET | Freq: Every day | ORAL | 11 refills | Status: DC

## 2022-05-10 MED ORDER — AMOXICILLIN-POT CLAVULANATE 500-125 MG PO TABS: 1.0000 | ORAL_TABLET | Freq: Three times a day (TID) | ORAL | 0 refills | Status: AC

## 2022-05-10 MED ORDER — DM-GUAIFENESIN ER 30-600 MG PO TB12: 1.0000 | ORAL_TABLET | Freq: Two times a day (BID) | ORAL | 0 refills | Status: AC

## 2022-05-10 NOTE — Progress Notes (Signed)
Has congested cough which started 4 days ago and lung sounds are not clear.  Notified Dr. Manuella Ghazi and he ordered CXR which showed some right basilar opacity.

## 2022-05-10 NOTE — Discharge Summary (Signed)
Physician Discharge Summary   Jesse Suarez:[id number] DOB: 1960-12-31 DOA: 05/09/2022  PCP: Merryl Hacker, No  Admit date: 05/09/2022  Discharge date: 05/10/2022  Admitted From:Home  Disposition:  Home  Recommendations for Outpatient Follow-up:  Follow up with PCP in 1-2 weeks Continue on Augmentin as prescribed for right lower lobe pneumonia noted on chest x-ray Continue other home medications as prior with decreased dose of levothyroxine to 125 mcg daily  Home Health: None  Equipment/Devices: None none  Discharge Condition:Stable  CODE STATUS: Full  Diet recommendation: Heart Healthy  Brief/Interim Summary: Jesse BRASS [name] is a 61 y.o. male with medical history significant for paroxysmal atrial fibrillation, chronic diastolic CHF, hypertension, hypothyroidism, dyslipidemia, and BPH who presented to the ED with complaints of generalized weakness over the last several days with multiple falls, but no sustained injuries.  He has had poor oral intake, but appears to have been taking his medications regularly.  He has decreased appetite and has been drinking mostly [facility].  He was admitted with prerenal AKI in the setting of poor oral intake and dehydration with associated mild hyponatremia that has improved after IV fluid administration.  He has been seen by PT with no further weakness or risk of falls noted.  He was noted to have a cough on the day of discharge for which chest x-ray was performed and demonstrated right lower lobe pneumonia and he will be prescribed Augmentin as well as antitussives on discharge as this could easily be managed outpatient.  His home levothyroxine dose has been adjusted per parameters on TSH and free T4.  He is currently in stable condition for discharge with no other acute events noted.  Discharge Diagnoses:  Principal Problem:   AKI (acute kidney injury) ([place]) Active Problems:   Paroxysmal atrial fibrillation ([place])   Hypothyroidism following  radioiodine therapy   Essential hypertension   Dyslipidemia   Depression with anxiety   COPD GOLD 0/ emphysema on CT   Principal discharge diagnosis: Prerenal AKI secondary to dehydration with associated hyponatremia.  Right lower lobe community-acquired pneumonia.  Discharge Instructions  Discharge Instructions     Diet - low sodium heart healthy   Complete by: As directed    Increase activity slowly   Complete by: As directed       Allergies as of 05/10/2022       Reactions   Sulfa Drugs Cross Reactors Hives   Codeine Other (See Comments)   Made him very jittery    Hydrochlorothiazide Other (See Comments)   Hyponatremia        Medication List     STOP taking these medications    amitriptyline 25 MG tablet Commonly known as: ELAVIL   Bactrim DS 800-160 MG tablet Generic drug: sulfamethoxazole-trimethoprim   clindamycin 300 MG capsule Commonly known as: CLEOCIN   doxycycline 100 MG tablet Commonly known as: VIBRA-TABS   FLUoxetine 40 MG capsule Commonly known as: PROZAC   sertraline 50 MG tablet Commonly known as: ZOLOFT   traZODone 50 MG tablet Commonly known as: DESYREL       TAKE these medications    albuterol (2.5 MG/3ML) 0.083% nebulizer solution Commonly known as: PROVENTIL Take 3 mLs (2.5 mg total) by nebulization every 6 (six) hours as needed for wheezing or shortness of breath. What changed: Another medication with the same name was changed. Make sure you understand how and when to take each.   albuterol 108 (90 Base) MCG/ACT inhaler Commonly known as: VENTOLIN HFA INHALE  2 PUFFS INTO THE LUNGS EVERY 6 HOURS AS NEEDED FOR WHEEZING/SHORTNESS OF BREATH What changed: See the new instructions.   amoxicillin-clavulanate 500-125 MG tablet Commonly known as: Augmentin Take 1 tablet (500 mg total) by mouth 3 (three) times daily for 7 days.   aspirin 81 MG tablet Take 81 mg by mouth every other day.   azelastine 0.1 % nasal spray Commonly  known as: ASTELIN Place 1 spray into both nostrils 2 (two) times daily. Use in each nostril as directed   carvedilol 12.5 MG tablet Commonly known as: COREG Take 1.5 tablets (18.75 mg total) by mouth 2 (two) times daily with a meal.   cloNIDine 0.1 mg/24hr patch Commonly known as: CATAPRES - Dosed in mg/24 hr PLACE 1 PATCH ONTO THE SKIN ONCE A WEEK   dextromethorphan-guaiFENesin 30-600 MG 12hr tablet Commonly known as: MUCINEX DM Take 1 tablet by mouth 2 (two) times daily for 7 days.   flecainide 100 MG tablet Commonly known as: TAMBOCOR TAKE 1 TABLET BY MOUTH TWICE A DAY   hydrOXYzine 10 MG tablet Commonly known as: ATARAX Take 0.5-1 tablets (5-10 mg total) by mouth 3 (three) times daily as needed.   levothyroxine 125 MCG tablet Commonly known as: Synthroid Take 1 tablet (125 mcg total) by mouth daily. What changed:  medication strength how much to take when to take this   lisinopril 40 MG tablet Commonly known as: ZESTRIL Take 1 tablet (40 mg total) by mouth daily.   montelukast 10 MG tablet Commonly known as: SINGULAIR TAKE 1 TABLET BY MOUTH EVERYDAY AT BEDTIME What changed: See the new instructions.   ondansetron 8 MG disintegrating tablet Commonly known as: Zofran ODT Take 1 tablet (8 mg total) by mouth every 8 (eight) hours as needed.   pantoprazole 40 MG tablet Commonly known as: PROTONIX TAKE 1 TABLET BY MOUTH EVERY DAY   simvastatin 40 MG tablet Commonly known as: ZOCOR TAKE 1 TABLET BY MOUTH EVERY DAY   tamsulosin 0.4 MG Caps capsule Commonly known as: FLOMAX TAKE 2 CAPSULES BY MOUTH EVERY DAY   Vitamin D (Ergocalciferol) 1.25 MG (50000 UNIT) Caps capsule Commonly known as: DRISDOL Take 1 capsule (50,000 Units total) by mouth every 7 (seven) days.   VITAMIN D3 PO Take 1 tablet by mouth daily.        Allergies  Allergen Reactions   Sulfa Drugs Cross Reactors Hives   Codeine Other (See Comments)    Made him very jittery     Hydrochlorothiazide Other (See Comments)    Hyponatremia    Consultations: None   Procedures/Studies: DG CHEST PORT 1 VIEW  Result Date: 05/10/2022 CLINICAL DATA:  Chest pain. No shortness of breath. Cough for a month. EXAM: PORTABLE CHEST 1 VIEW COMPARISON:  May 07, 2018 FINDINGS: Opacity in the right base is identified. Lungs are otherwise clear. The elevated right hemidiaphragm remains. The cardiomediastinal silhouette is stable. No other acute abnormalities. IMPRESSION: Opacity in the right base may represent pneumonia given history. Recommend short-term follow-up imaging to ensure resolution. Electronically Signed   By: Dorise Bullion III M.D.   On: 05/10/2022 11:15     Discharge Exam: Vitals:   05/10/22 0500 05/10/22 1344  BP: 124/79 (!) 142/97  Pulse: 79 91  Resp: 18 19  Temp: 98 F (36.7 C) 98.4 F (36.9 C)  SpO2: 99% 98%   Vitals:   05/10/22 0100 05/10/22 0126 05/10/22 0500 05/10/22 1344  BP:  117/78 124/79 (!) 142/97  Pulse:  95 79  91  Resp:  '18 18 19  '$ Temp:  98 F (36.7 C) 98 F (36.7 C) 98.4 F (36.9 C)  TempSrc:      SpO2:  96% 99% 98%  Weight: 89.4 kg 89.4 kg    Height: '5\' 9"'$  (1.753 m) '5\' 9"'$  (1.753 m)      General: Pt is alert, awake, not in acute distress Cardiovascular: RRR, S1/S2 +, no rubs, no gallops Respiratory: CTA bilaterally, no wheezing, no rhonchi Abdominal: Soft, NT, ND, bowel sounds + Extremities: no edema, no cyanosis    The results of significant diagnostics from this hospitalization (including imaging, microbiology, ancillary and laboratory) are listed below for reference.     Microbiology: No results found for this or any previous visit (from the past 240 hour(s)).   Labs: BNP (last 3 results) No results for input(s): "BNP" in the last 8760 hours. Basic Metabolic Panel: Recent Labs  Lab 05/09/22 1035 05/09/22 1525 05/10/22 0503  NA 131*  --  132*  K 3.9  --  4.3  CL 101  --  106  CO2 22  --  21*  GLUCOSE 119*  --   113*  BUN 32*  --  18  CREATININE 2.68* 1.77* 0.79  CALCIUM 8.4*  --  8.2*  MG  --   --  1.8   Liver Function Tests: No results for input(s): "AST", "ALT", "ALKPHOS", "BILITOT", "PROT", "ALBUMIN" in the last 168 hours. No results for input(s): "LIPASE", "AMYLASE" in the last 168 hours. No results for input(s): "AMMONIA" in the last 168 hours. CBC: Recent Labs  Lab 05/09/22 1035 05/09/22 1525 05/10/22 0503  WBC 15.7* 13.4* 11.6*  HGB 12.5* 12.8* 12.1*  HCT 36.3* 37.9* 35.8*  MCV 91.4 92.0 91.8  PLT 230 221 222   Cardiac Enzymes: No results for input(s): "CKTOTAL", "CKMB", "CKMBINDEX", "TROPONINI" in the last 168 hours. BNP: Invalid input(s): "POCBNP" CBG: No results for input(s): "GLUCAP" in the last 168 hours. D-Dimer No results for input(s): "DDIMER" in the last 72 hours. Hgb A1c No results for input(s): "HGBA1C" in the last 72 hours. Lipid Profile No results for input(s): "CHOL", "HDL", "LDLCALC", "TRIG", "CHOLHDL", "LDLDIRECT" in the last 72 hours. Thyroid function studies Recent Labs    05/09/22 1035  TSH 0.338*   Anemia work up Recent Labs    05/09/22 1525  VITAMINB12 241  FOLATE 9.2  FERRITIN 114  TIBC 290  IRON 28*  RETICCTPCT 1.4   Urinalysis    Component Value Date/Time   COLORURINE YELLOW 05/09/2022 1158   APPEARANCEUR HAZY (A) 05/09/2022 1158   LABSPEC 1.016 05/09/2022 1158   PHURINE 5.0 05/09/2022 1158   GLUCOSEU NEGATIVE 05/09/2022 1158   GLUCOSEU NEGATIVE 03/31/2022 1030   HGBUR NEGATIVE 05/09/2022 1158   BILIRUBINUR NEGATIVE 05/09/2022 1158   BILIRUBINUR negative 05/22/2016 1323   BILIRUBINUR neg 03/22/2015 1017   KETONESUR 5 (A) 05/09/2022 1158   PROTEINUR NEGATIVE 05/09/2022 1158   UROBILINOGEN 0.2 03/31/2022 1030   NITRITE NEGATIVE 05/09/2022 1158   LEUKOCYTESUR NEGATIVE 05/09/2022 1158   Sepsis Labs Recent Labs  Lab 05/09/22 1035 05/09/22 1525 05/10/22 0503  WBC 15.7* 13.4* 11.6*   Microbiology No results found for this  or any previous visit (from the past 240 hour(s)).   Time coordinating discharge: 35 minutes  SIGNED:   Rodena Goldmann, DO [facility] 05/10/2022, 2:09 PM  If 7PM-7AM, please contact night-coverage [email or web address]

## 2022-05-10 NOTE — TOC Progression Note (Signed)
  Transition of Care (TOC) Screening Note   Patient Details  Name: Jesse TIMBROOK [name] Date of Birth: 11-Dec-1960   Transition of Care [facility] CM/SW Contact:    Boneta Lucks, RN Phone Number: 05/10/2022, 1:42 PM  NO PT follow up.  Transition of Care Department [facility]) has reviewed patient and no TOC needs have been identified at this time. We will continue to monitor patient advancement through interdisciplinary progression rounds. If new patient transition needs arise, please place a TOC consult.     Expected Discharge Plan: Home/Self Care Barriers to Discharge: No Barriers Identified  Expected Discharge Plan and Services Expected Discharge Plan: Home/Self Care       Living arrangements for the past 2 months: Single Family Home Expected Discharge Date: 05/10/22

## 2022-05-10 NOTE — Evaluation (Signed)
Physical Therapy Evaluation Patient Details Name: Jesse ERICSON [name] MRN: [id number] DOB: 1960-12-26 Today's Date: 05/10/2022  History of Present Illness  per Dr. Martin Majestic R Totton [name] is a 61 y.o. male with medical history significant for paroxysmal atrial fibrillation, chronic diastolic CHF, hypertension, hypothyroidism, dyslipidemia, and BPH who presented to the ED with complaints of generalized weakness over the last several days with multiple falls, but no sustained injuries.  He has had poor oral intake, but appears to have been taking his medications regularly.  He has decreased appetite and has been drinking mostly [facility]  PT at or near prior functioning level.        Recommendations for follow up therapy are one component of a multi-disciplinary discharge planning process, led by the attending physician.  Recommendations may be updated based on patient status, additional functional criteria and insurance authorization.  Follow Up Recommendations No PT follow up      Assistance Recommended at Discharge  none  Patient can return home with the following  Other (comment) (no assist)    Equipment Recommendations None recommended by PT  Recommendations for Other Services    none   Functional Status Assessment       Precautions / Restrictions Precautions Precautions: None Restrictions Weight Bearing Restrictions: No      Mobility  Bed Mobility Overal bed mobility: Independent             General bed mobility comments: PT in chair but verbalizes that he got out of bed I    Transfers Overall transfer level: Independent Equipment used: None                    Ambulation/Gait Ambulation/Gait assistance: Independent Social research officer, government (Feet): 250 Feet Assistive device: IV Pole Gait Pattern/deviations: WFL(Within Functional Limits) Gait velocity: normal             Pertinent Vitals/Pain Pain Assessment Pain  Assessment: No/denies pain    Home Living Family/patient expects to be discharged to:: Private residence Living Arrangements: Spouse/significant other Available Help at Discharge: Family Type of Home: House Home Access: Ramped entrance       Home Layout: One level Home Equipment: None      Prior Function  I                        Extremity/Trunk Assessment        Lower Extremity Assessment Lower Extremity Assessment: Overall WFL for tasks assessed       Communication      Cognition Arousal/Alertness: Awake/alert Behavior During Therapy: WFL for tasks assessed/performed Overall Cognitive Status: Within Functional Limits for tasks assessed                                                 Assessment/Plan    PT Assessment Patient does not need any further PT services  PT Problem List         PT Treatment Interventions      PT Goals (Current goals can be found in the Care Plan section)  Acute Rehab PT Goals Patient Stated Goal: Pt to go home PT Goal Formulation: With patient/family Time For Goal Achievement: 05/10/22 Potential to Achieve Goals: Good            AM-PAC PT "6 Clicks" Mobility  Outcome Measure Help needed turning from your back to your side while in a flat bed without using bedrails?: None Help needed moving from lying on your back to sitting on the side of a flat bed without using bedrails?: None Help needed moving to and from a bed to a chair (including a wheelchair)?: None Help needed standing up from a chair using your arms (e.g., wheelchair or bedside chair)?: None Help needed to walk in hospital room?: None Help needed climbing 3-5 steps with a railing? : None 6 Click Score: 24    End of Session Equipment Utilized During Treatment: Gait belt Activity Tolerance: Patient tolerated treatment well Patient left: in chair;with family/visitor present Nurse Communication: Mobility status PT Visit Diagnosis:  Unsteadiness on feet (R26.81)    Time: 1594-7076 PT Time Calculation (min) (ACUTE ONLY): 17 min   Charges:   PT Evaluation $PT Eval Low Complexity: [place], PT CLT [phone]  05/10/2022, 11:32 AM

## 2022-05-11 ENCOUNTER — Other Ambulatory Visit: Payer: Self-pay | Admitting: Physician Assistant

## 2022-05-11 DIAGNOSIS — I10 Essential (primary) hypertension: Secondary | ICD-10-CM

## 2022-05-12 ENCOUNTER — Other Ambulatory Visit: Payer: Self-pay

## 2022-05-12 ENCOUNTER — Encounter (HOSPITAL_COMMUNITY): Payer: Self-pay

## 2022-05-12 ENCOUNTER — Emergency Department (HOSPITAL_COMMUNITY)
Admission: EM | Admit: 2022-05-12 | Discharge: 2022-05-12 | Disposition: A | Discharge status: Home / Self Care | Payer: 59 | Attending: Emergency Medicine | Admitting: Emergency Medicine

## 2022-05-12 DIAGNOSIS — F419 Anxiety disorder, unspecified: Secondary | ICD-10-CM | POA: Diagnosis present

## 2022-05-12 DIAGNOSIS — Z79899 Other long term (current) drug therapy: Secondary | ICD-10-CM | POA: Insufficient documentation

## 2022-05-12 DIAGNOSIS — G4700 Insomnia, unspecified: Secondary | ICD-10-CM | POA: Diagnosis not present

## 2022-05-12 LAB — CBC WITH DIFFERENTIAL/PLATELET
Abs Immature Granulocytes: 0.06 10*3/uL (ref 0.00–0.07)
Basophils Absolute: 0 10*3/uL (ref 0.0–0.1)
Basophils Relative: 0 %
Eosinophils Absolute: 0.1 10*3/uL (ref 0.0–0.5)
Eosinophils Relative: 1 %
HCT: 37.7 % — ABNORMAL LOW (ref 39.0–52.0)
Hemoglobin: 12.7 g/dL — ABNORMAL LOW (ref 13.0–17.0)
Immature Granulocytes: 1 %
Lymphocytes Relative: 10 %
Lymphs Abs: 1 10*3/uL (ref 0.7–4.0)
MCH: 31.1 pg (ref 26.0–34.0)
MCHC: 33.7 g/dL (ref 30.0–36.0)
MCV: 92.2 fL (ref 80.0–100.0)
Monocytes Absolute: 0.9 10*3/uL (ref 0.1–1.0)
Monocytes Relative: 9 %
Neutro Abs: 7.9 10*3/uL — ABNORMAL HIGH (ref 1.7–7.7)
Neutrophils Relative %: 79 %
Platelets: 196 10*3/uL (ref 150–400)
RBC: 4.09 MIL/uL — ABNORMAL LOW (ref 4.22–5.81)
RDW: 13.2 % (ref 11.5–15.5)
WBC: 9.9 10*3/uL (ref 4.0–10.5)
nRBC: 0 % (ref 0.0–0.2)

## 2022-05-12 LAB — BASIC METABOLIC PANEL
Anion gap: 7 (ref 5–15)
BUN: 10 mg/dL (ref 8–23)
CO2: 24 mmol/L (ref 22–32)
Calcium: 8.4 mg/dL — ABNORMAL LOW (ref 8.9–10.3)
Chloride: 101 mmol/L (ref 98–111)
Creatinine, Ser: 0.78 mg/dL (ref 0.61–1.24)
GFR, Estimated: >60 mL/min (ref >60)
Glucose, Bld: 102 mg/dL — ABNORMAL HIGH (ref 70–99)
Potassium: 3.9 mmol/L (ref 3.5–5.1)
Sodium: 132 mmol/L — ABNORMAL LOW (ref 135–145)

## 2022-05-12 NOTE — ED Notes (Signed)
Discharge instructions reviewed with the patient Patient had no question or concerns to report. Patient discharged.

## 2022-05-12 NOTE — ED Triage Notes (Signed)
Pt presents with anxiety, trouble sleeping. States that he is just scared to go to sleep at night and during the day. Pt states he is coming off xanax.

## 2022-05-12 NOTE — ED Provider Notes (Signed)
[facility] EMERGENCY DEPARTMENT Provider Note   CSN: [id number] Arrival date & time: 05/12/22  1062     History  Chief Complaint  Patient presents with   Anxiety    Jesse Pigeon Maisel [name] is a 61 y.o. male.  The history is provided by the patient.   Patient presents because he reports he has a hard time sleeping.  He reports he feels anxious and is unable to fall asleep.  He reports he feels scared to go to sleep but otherwise feels safe at home.  He also has difficulty sleeping during the day.  He was just recently admitted to the hospital.  He has been on Xanax previously, but has not taken it in quite a while. He has no other complaints He denies chest pain/shortness of breath.  No vomiting or diarrhea.    Home Medications Prior to Admission medications   Medication Sig Start Date End Date Taking? Authorizing Provider  albuterol (PROVENTIL) (2.5 MG/3ML) 0.083% nebulizer solution Take 3 mLs (2.5 mg total) by nebulization every 6 (six) hours as needed for wheezing or shortness of breath. 08/17/18   Brunetta Jeans, PA-C  albuterol (VENTOLIN HFA) 108 (90 Base) MCG/ACT inhaler INHALE 2 PUFFS INTO THE LUNGS EVERY 6 HOURS AS NEEDED FOR WHEEZING/SHORTNESS OF BREATH Patient taking differently: Inhale 2 puffs into the lungs every 6 (six) hours as needed for shortness of breath. 04/24/22   Wendie Agreste, MD  amoxicillin-clavulanate (AUGMENTIN) 500-125 MG tablet Take 1 tablet (500 mg total) by mouth 3 (three) times daily for 7 days. 05/10/22 05/17/22  Manuella Ghazi, Pratik D, DO  aspirin 81 MG tablet Take 81 mg by mouth every other day. Patient not taking: Reported on 05/09/2022    [provider]  azelastine (ASTELIN) 0.1 % nasal spray Place 1 spray into both nostrils 2 (two) times daily. Use in each nostril as directed Patient not taking: Reported on 05/09/2022 06/26/21   Maximiano Coss, NP  carvedilol (COREG) 12.5 MG tablet Take 1.5 tablets (18.75 mg total) by mouth 2 (two) times daily  with a meal. 11/06/21   Baldwin Jamaica, PA-C  Cholecalciferol (VITAMIN D3 PO) Take 1 tablet by mouth daily.    [provider]  cloNIDine (CATAPRES - DOSED IN MG/24 HR) 0.1 mg/24hr patch PLACE 1 PATCH ONTO THE SKIN ONCE A WEEK 02/04/22   [name], MD  dextromethorphan-guaiFENesin [facility] DM) 30-600 MG 12hr tablet Take 1 tablet by mouth 2 (two) times daily for 7 days. 05/10/22 05/17/22  Manuella Ghazi, Pratik D, DO  flecainide (TAMBOCOR) 100 MG tablet TAKE 1 TABLET BY MOUTH TWICE A DAY 10/10/21   Evans Lance, MD  hydrOXYzine (ATARAX) 10 MG tablet Take 0.5-1 tablets (5-10 mg total) by mouth 3 (three) times daily as needed. 02/05/22   Maximiano Coss, NP  levothyroxine (SYNTHROID) 125 MCG tablet Take 1 tablet (125 mcg total) by mouth daily. 05/10/22 05/10/23  Manuella Ghazi, Pratik D, DO  lisinopril (ZESTRIL) 40 MG tablet Take 1 tablet (40 mg total) by mouth daily. 11/06/21   Baldwin Jamaica, PA-C  montelukast (SINGULAIR) 10 MG tablet TAKE 1 TABLET BY MOUTH EVERYDAY AT BEDTIME Patient taking differently: Take 10 mg by mouth at bedtime. 04/01/22   Maximiano Coss, NP  ondansetron (ZOFRAN ODT) 8 MG disintegrating tablet Take 1 tablet (8 mg total) by mouth every 8 (eight) hours as needed. 01/06/22   Maximiano Coss, NP  pantoprazole (PROTONIX) 40 MG tablet TAKE 1 TABLET BY MOUTH EVERY DAY Patient not taking:  Reported on 05/09/2022 11/06/21   Maximiano Coss, NP  simvastatin (ZOCOR) 40 MG tablet TAKE 1 TABLET BY MOUTH EVERY DAY 01/05/22   Maximiano Coss, NP  tamsulosin (FLOMAX) 0.4 MG CAPS capsule TAKE 2 CAPSULES BY MOUTH EVERY DAY 04/24/22   Wendie Agreste, MD  Vitamin D, Ergocalciferol, (DRISDOL) 1.25 MG (50000 UNIT) CAPS capsule Take 1 capsule (50,000 Units total) by mouth every 7 (seven) days. 01/06/22   Maximiano Coss, NP      Allergies    Sulfa drugs cross reactors, Codeine, and Hydrochlorothiazide    Review of Systems   Review of Systems  Constitutional:  Negative for fever.  Respiratory:  Negative for  shortness of breath.   Cardiovascular:  Negative for chest pain.  Gastrointestinal:  Negative for diarrhea and vomiting.  Psychiatric/Behavioral:  Positive for sleep disturbance. The patient is nervous/anxious.     Physical Exam Updated Vital Signs BP (!) 132/95   Pulse 67   Temp 98.3 F (36.8 C)   Resp 18   SpO2 97%  Physical Exam CONSTITUTIONAL: Elderly and anxious HEAD: Normocephalic/atraumatic EYES: EOMI/PERRL ENMT: Mucous membranes moist NECK: supple no meningeal signs SPINE/BACK:entire spine nontender CV: Irregular, no loud murmurs LUNGS: Lungs are clear to auscultation bilaterally, no apparent distress ABDOMEN: soft, nontende NEURO: Pt is awake/alert/appropriate, moves all extremitiesx4.  No facial droop.   EXTREMITIES: pulses normal/equal, full ROM SKIN: warm, color normal PSYCH: Anxious  ED Results / Procedures / Treatments   Labs (all labs ordered are listed, but only abnormal results are displayed) Labs Reviewed  CBC WITH DIFFERENTIAL/PLATELET - Abnormal; Notable for the following components:      Result Value   RBC 4.09 (*)    Hemoglobin 12.7 (*)    HCT 37.7 (*)    Neutro Abs 7.9 (*)    All other components within normal limits  BASIC METABOLIC PANEL - Abnormal; Notable for the following components:   Sodium 132 (*)    Glucose, Bld 102 (*)    Calcium 8.4 (*)    All other components within normal limits    EKG EKG Interpretation  Date/Time:  Tuesday May 12 2022 02:37:39 EDT Ventricular Rate:  88 PR Interval:    QRS Duration: 95 QT Interval:  340 QTC Calculation: 412 R Axis:   90 Text Interpretation: Atrial fibrillation Borderline right axis deviation Nonspecific T abnormalities, lateral leads No significant change since last tracing Confirmed by Ripley Fraise [phone]) on 05/12/2022 2:48:48 AM  Radiology DG CHEST PORT 1 VIEW  Result Date: 05/10/2022 CLINICAL DATA:  Chest pain. No shortness of breath. Cough for a month. EXAM: PORTABLE CHEST 1  VIEW COMPARISON:  May 07, 2018 FINDINGS: Opacity in the right base is identified. Lungs are otherwise clear. The elevated right hemidiaphragm remains. The cardiomediastinal silhouette is stable. No other acute abnormalities. IMPRESSION: Opacity in the right base may represent pneumonia given history. Recommend short-term follow-up imaging to ensure resolution. Electronically Signed   By: Dorise Bullion III M.D.   On: 05/10/2022 11:15    Procedures Procedures    Medications Ordered in ED Medications - No data to display  ED Course/ Medical Decision Making/ A&P Clinical Course as of 05/12/22 0433  Tue May 12, 2022  0431 On reassessment patient was sleeping.  Labs overall reassuring.  Patient continues to deny any suicidal thoughts or ideation.  He reports he feels safe at home but just has difficulty sleeping. [DW]  [phone] Patient is had significant life changes recently.  He was admitted  to the hospital for pneumonia, and his wife is now staying in the hospital while awaiting placement.  I advised him that this is likely playing a role in his symptoms.  He is already supposed to be on Zoloft and hydroxyzine.  I advised him to continue with his medications.  Patient also reports that his PCP has  left town and he is searching for a new one.  He will be given information on a new PCP [DW]  [phone] Patient appears safe for discharge [DW]    Clinical Course User Index [DW] Ripley Fraise, MD                           Medical Decision Making Amount and/or Complexity of Data Reviewed Labs: ordered.   Patient presents with anxiety and difficulty sleeping.  He is in no acute distress.  Patient was just recently in hospital for pneumonia and dehydration, will recheck labs.        Final Clinical Impression(s) / ED Diagnoses Final diagnoses:  Anxiety  Insomnia, unspecified type    Rx / DC Orders ED Discharge Orders     None         Ripley Fraise, MD 05/12/22 [phone]

## 2022-05-15 ENCOUNTER — Encounter: Payer: Self-pay | Admitting: Family Medicine

## 2022-05-15 ENCOUNTER — Ambulatory Visit: Payer: 59 | Admitting: Family Medicine

## 2022-05-15 VITALS — BP 130/80 | HR 117 | Temp 97.8°F | Ht 69.0 in | Wt 192.4 lb

## 2022-05-15 DIAGNOSIS — I480 Paroxysmal atrial fibrillation: Secondary | ICD-10-CM

## 2022-05-15 DIAGNOSIS — R000 Tachycardia, unspecified: Secondary | ICD-10-CM | POA: Diagnosis not present

## 2022-05-15 DIAGNOSIS — F5104 Psychophysiologic insomnia: Secondary | ICD-10-CM | POA: Diagnosis not present

## 2022-05-15 DIAGNOSIS — N179 Acute kidney failure, unspecified: Secondary | ICD-10-CM

## 2022-05-15 DIAGNOSIS — Z23 Encounter for immunization: Secondary | ICD-10-CM | POA: Diagnosis not present

## 2022-05-15 DIAGNOSIS — J189 Pneumonia, unspecified organism: Secondary | ICD-10-CM

## 2022-05-15 DIAGNOSIS — R7989 Other specified abnormal findings of blood chemistry: Secondary | ICD-10-CM

## 2022-05-15 DIAGNOSIS — E890 Postprocedural hypothyroidism: Secondary | ICD-10-CM

## 2022-05-15 DIAGNOSIS — F418 Other specified anxiety disorders: Secondary | ICD-10-CM

## 2022-05-15 MED ORDER — SERTRALINE HCL 50 MG PO TABS: 50.0000 mg | ORAL_TABLET | Freq: Every day | ORAL | 3 refills | Status: DC

## 2022-05-15 MED ORDER — QUETIAPINE FUMARATE 25 MG PO TABS: 25.0000 mg | ORAL_TABLET | Freq: Every evening | ORAL | 0 refills | Status: DC | PRN

## 2022-05-15 NOTE — Patient Instructions (Addendum)
Restart sertraline once per day.  For sleep, can try seroquel once at bedtime. If no relief with 1, then take additional dose.  Do not take any other sleep medications with this medicine.  You are in atrial fibrillation today. That may be due to abnormal thyroid test and recent pneumonia. Continue flecainide, carvedilol. If any lightheadedness, dizziness, chest pains or new symptoms go to ER.   Continue to drink sufficient fluids, and continue antibiotic for pneumonia. Recheck next week as we discussed, but if unable to sleep tonight or worsening anxiety, please be seen at behavioral health or emergency room if needed. Hang in there!  Mental health hotline: [place]: [place], [place] [address] HelpLine: 954-488-3543 or 1-213-780-4537  Insomnia Insomnia is a sleep disorder that makes it difficult to fall asleep or stay asleep. Insomnia can cause fatigue, low energy, difficulty concentrating, mood swings, and poor performance at work or school. There are three different ways to classify insomnia: Difficulty falling asleep. Difficulty staying asleep. Waking up too early in the morning. Any type of insomnia can be long-term (chronic) or short-term (acute). Both are common. Short-term insomnia usually lasts for 3 months or less. Chronic insomnia occurs at least three times a week for longer than 3 months. What are the causes? Insomnia may be caused by another condition, situation, or substance, such as: Having certain mental health conditions, such as anxiety and depression. Using caffeine, alcohol, tobacco, or drugs. Having gastrointestinal conditions, such as gastroesophageal reflux disease (GERD). Having certain medical conditions. These include: Asthma. Alzheimer's disease. Stroke. Chronic pain. An overactive thyroid gland (hyperthyroidism). Other sleep disorders, such as restless legs syndrome and sleep apnea. Menopause. Sometimes, the cause of insomnia  may not be known. What increases the risk? Risk factors for insomnia include: Gender. Females are affected more often than males. Age. Insomnia is more common as people get older. Stress and certain medical and mental health conditions. Lack of exercise. Having an irregular work schedule. This may include working night shifts and traveling between different time zones. What are the signs or symptoms? If you have insomnia, the main symptom is having trouble falling asleep or having trouble staying asleep. This may lead to other symptoms, such as: Feeling tired or having low energy. Feeling nervous about going to sleep. Not feeling rested in the morning. Having trouble concentrating. Feeling irritable, anxious, or depressed. How is this diagnosed? This condition may be diagnosed based on: Your symptoms and medical history. Your health care provider may ask about: Your sleep habits. Any medical conditions you have. Your mental health. A physical exam. How is this treated? Treatment for insomnia depends on the cause. Treatment may focus on treating an underlying condition that is causing the insomnia. Treatment may also include: Medicines to help you sleep. Counseling or therapy. Lifestyle adjustments to help you sleep better. Follow these instructions at home: Eating and drinking  Limit or avoid alcohol, caffeinated beverages, and products that contain nicotine and tobacco, especially close to bedtime. These can disrupt your sleep. Do not eat a large meal or eat spicy foods right before bedtime. This can lead to digestive discomfort that can make it hard for you to sleep. Sleep habits  Keep a sleep diary to help you and your health care provider figure out what could be causing your insomnia. Write down: When you sleep. When you wake up during the night. How well you sleep and how rested you feel the next day. Any side effects of  medicines you are taking. What you eat and  drink. Make your bedroom a dark, comfortable place where it is easy to fall asleep. Put up shades or blackout curtains to block light from outside. Use a white noise machine to block noise. Keep the temperature cool. Limit screen use before bedtime. This includes: Not watching TV. Not using your smartphone, tablet, or computer. Stick to a routine that includes going to bed and waking up at the same times every day and night. This can help you fall asleep faster. Consider making a quiet activity, such as reading, part of your nighttime routine. Try to avoid taking naps during the day so that you sleep better at night. Get out of bed if you are still awake after 15 minutes of trying to sleep. Keep the lights down, but try reading or doing a quiet activity. When you feel sleepy, go back to bed. General instructions Take over-the-counter and prescription medicines only as told by your health care provider. Exercise regularly as told by your health care provider. However, avoid exercising in the hours right before bedtime. Use relaxation techniques to manage stress. Ask your health care provider to suggest some techniques that may work well for you. These may include: Breathing exercises. Routines to release muscle tension. Visualizing peaceful scenes. Make sure that you drive carefully. Do not drive if you feel very sleepy. Keep all follow-up visits. This is important. Contact a health care provider if: You are tired throughout the day. You have trouble in your daily routine due to sleepiness. You continue to have sleep problems, or your sleep problems get worse. Get help right away if: You have thoughts about hurting yourself or someone else. Get help right away if you feel like you may hurt yourself or others, or have thoughts about taking your own life. Go to your nearest emergency room or: Call 911. Call the [facility] at 847-732-7046 or 988. This is open 24  hours a day. Text the Crisis Text Line at 6474417809. Summary Insomnia is a sleep disorder that makes it difficult to fall asleep or stay asleep. Insomnia can be long-term (chronic) or short-term (acute). Treatment for insomnia depends on the cause. Treatment may focus on treating an underlying condition that is causing the insomnia. Keep a sleep diary to help you and your health care provider figure out what could be causing your insomnia. This information is not intended to replace advice given to you by your health care provider. Make sure you discuss any questions you have with your health care provider. Document Revised: 08/11/2021 Document Reviewed: 08/11/2021 Elsevier Patient Education  [place], Adult After being diagnosed with anxiety, you may be relieved to know why you have felt or behaved a certain way. You may also feel overwhelmed about the treatment ahead and what it will mean for your life. With care and support, you can manage this condition. How to manage lifestyle changes Managing stress and anxiety  Stress is your body's reaction to life changes and events, both good and bad. Most stress will last just a few hours, but stress can be ongoing and can lead to more than just stress. Although stress can play a major role in anxiety, it is not the same as anxiety. Stress is usually caused by something external, such as a deadline, test, or competition. Stress normally passes after the triggering event has ended.  Anxiety is caused by something internal, such as imagining a terrible  outcome or worrying that something will go wrong that will devastate you. Anxiety often does not go away even after the triggering event is over, and it can become long-term (chronic) worry. It is important to understand the differences between stress and anxiety and to manage your stress effectively so that it does not lead to an anxious response. Talk with your health care  provider or a counselor to learn more about reducing anxiety and stress. He or she may suggest tension reduction techniques, such as: Music therapy. Spend time creating or listening to music that you enjoy and that inspires you. Mindfulness-based meditation. Practice being aware of your normal breaths while not trying to control your breathing. It can be done while sitting or walking. Centering prayer. This involves focusing on a word, phrase, or sacred image that means something to you and brings you peace. Deep breathing. To do this, expand your stomach and inhale slowly through your nose. Hold your breath for 3-5 seconds. Then exhale slowly, letting your stomach muscles relax. Self-talk. Learn to notice and identify thought patterns that lead to anxiety reactions and change those patterns to thoughts that feel peaceful. Muscle relaxation. Taking time to tense muscles and then relax them. Choose a tension reduction technique that fits your lifestyle and personality. These techniques take time and practice. Set aside 5-15 minutes a day to do them. Therapists can offer counseling and training in these techniques. The training to help with anxiety may be covered by some insurance plans. Other things you can do to manage stress and anxiety include: Keeping a stress diary. This can help you learn what triggers your reaction and then learn ways to manage your response. Thinking about how you react to certain situations. You may not be able to control everything, but you can control your response. Making time for activities that help you relax and not feeling guilty about spending your time in this way. Doing visual imagery. This involves imagining or creating mental pictures to help you relax. Practicing yoga. Through yoga poses, you can lower tension and promote relaxation.  Medicines Medicines can help ease symptoms. Medicines for anxiety include: Antidepressant medicines. These are usually prescribed  for long-term daily control. Anti-anxiety medicines. These may be added in severe cases, especially when panic attacks occur. Medicines will be prescribed by a health care provider. When used together, medicines, psychotherapy, and tension reduction techniques may be the most effective treatment. Relationships Relationships can play a big part in helping you recover. Try to spend more time connecting with trusted friends and family members. Consider going to couples counseling if you have a partner, taking family education classes, or going to family therapy. Therapy can help you and others better understand your condition. How to recognize changes in your anxiety Everyone responds differently to treatment for anxiety. Recovery from anxiety happens when symptoms decrease and stop interfering with your daily activities at home or work. This may mean that you will start to: Have better concentration and focus. Worry will interfere less in your daily thinking. Sleep better. Be less irritable. Have more energy. Have improved memory. It is also important to recognize when your condition is getting worse. Contact your health care provider if your symptoms interfere with home or work and you feel like your condition is not improving. Follow these instructions at home: Activity Exercise. Adults should do the following: Exercise for at least 150 minutes each week. The exercise should increase your heart rate and make you sweat (moderate-intensity exercise). Strengthening  exercises at least twice a week. Get the right amount and quality of sleep. Most adults need 7-9 hours of sleep each night. Lifestyle  Eat a healthy diet that includes plenty of vegetables, fruits, whole grains, low-fat dairy products, and lean protein. Do not eat a lot of foods that are high in fats, added sugars, or salt (sodium). Make choices that simplify your life. Do not use any products that contain nicotine or tobacco. These  products include cigarettes, chewing tobacco, and vaping devices, such as e-cigarettes. If you need help quitting, ask your health care provider. Avoid caffeine, alcohol, and certain over-the-counter cold medicines. These may make you feel worse. Ask your pharmacist which medicines to avoid. General instructions Take over-the-counter and prescription medicines only as told by your health care provider. Keep all follow-up visits. This is important. Where to find support You can get help and support from these sources: Self-help groups. Online and [employer]. A trusted spiritual leader. Couples counseling. Family education classes. Family therapy. Where to find more information You may find that joining a support group helps you deal with your anxiety. The following sources can help you locate counselors or support groups near you: [place]: www.mentalhealthamerica.net Anxiety and Depression Association of [place] (ADAA): https://www.clark.net/ National Alliance on Mental Illness (NAMI): www.nami.org Contact a health care provider if: You have a hard time staying focused or finishing daily tasks. You spend many hours a day feeling worried about everyday life. You become exhausted by worry. You start to have headaches or frequently feel tense. You develop chronic nausea or diarrhea. Get help right away if: You have a racing heart and shortness of breath. You have thoughts of hurting yourself or others. If you ever feel like you may hurt yourself or others, or have thoughts about taking your own life, get help right away. Go to your nearest emergency department or: Call your local emergency services (911 in the [place] Call a suicide crisis helpline, such as the [place] [facility] at (706)263-9383 or 988 in the [place]. This is open 24 hours a day in the [place] Text the Crisis Text Line at 575-191-3528 (in the [place].). Summary Taking steps to learn and use tension  reduction techniques can help calm you and help prevent triggering an anxiety reaction. When used together, medicines, psychotherapy, and tension reduction techniques may be the most effective treatment. Family, friends, and partners can play a big part in supporting you. This information is not intended to replace advice given to you by your health care provider. Make sure you discuss any questions you have with your health care provider. Document Revised: 03/26/2021 Document Reviewed: 12/22/2020 Elsevier Patient Education  [place].

## 2022-05-15 NOTE — Progress Notes (Unsigned)
Subjective:  Patient ID: Jesse Europe [name], male    DOB: 12/06/1960  Age: 61 y.o. MRN: [id number]  CC:  Chief Complaint  Patient presents with   Insomnia    Went to [facility] and was dehydrated wanted to get kidney functions up and said he had pneumonia , and MRSA in his arm   Anxiety    Pt says he feels like he stays in a daze and feels like he has been running all the time he goes to sleep for 2 hours at a time normally he states he doesn't sleep none he is scared to sleep in the daytime due to getting his days and nights mixed up PHQ9 - Score 18    [name] presents for   Anxiety/insomnia: Symptoms as above.  Chart reviewed, he was admitted August 26 through 27 with acute kidney injury.  Prerenal AKI in the setting of poor oral intake and dehydration, mild hyponatremia that improved with IV fluid administration.  Was noted to have a cough, right lower lobe pneumonia and day of discharge, started on Augmentin, plan for outpatient management.  Thyroid supplementation also adjusted based on hospital labs(TSH 0.338 on 8/26, Synthroid 125 mcg daily).   He was again seen in the ER on August 29.  Anxiety, difficulty falling asleep.  Scared to go to sleep but otherwise felt safe at home based on ER notes.  Had previously taking Xanax but has been off that medication for a while.  With recent life changes, hospital admission, wife in the hospital with awaiting placement, thought to be impacting his symptoms.  Continued on zoloft, hydroxyzine.  CBC repeated with improved leukocytosis, normal WBC of 9.9.  Hyponatremia stable at 132, creatinine stable at 0.78 on the August 29 labs.  Pneumonia: Taking augmentin, cough improving, no fever, recent CBC ok.   AKI Improving creatinine few days ago. Less mountain dew, more water and some gatorade.urinating normally.    R elbow cellulitis Prior elbow infection on R treated by ortho - s/p 2 antibiotics, but off those now and area has  improved.   Insomnia, anxiety: Wife in and out of hospital and rehab. He is only one that waits on her and some irritation with him being her only caregiver. Some trouble getting to slepe and staying asleep. Slept 2 hours last night. No relief with hydroxyzine '10mg'$  tid, or zquil or tylenol PM.   Wife is patient of [facility], but she is in Rehab now - back home in few weeks. Wife's health has been main stressor.  No alcohol, or IDU.  Not taking sertraline - off past few months. Not sure what it was for. Hx of depression/anxiety.  No SI/HI. Does admit to possibly hearing a voice 2 nights ago, has not recurrence.  Body feels numb all over, no focal weakness, no visual or tactile hallucinations.  Scared about his health, but denies paranoia or specific fears of others or home safety concerns.      05/15/2022   12:07 PM 03/31/2022    9:42 AM 01/06/2022   11:45 AM 08/22/2021    9:08 AM 08/13/2021    9:08 AM  Depression screen PHQ 2/9  Decreased Interest 3 0 0 0 0  Down, Depressed, Hopeless 1 0 0 0 0  PHQ - 2 Score 4 0 0 0 0  Altered sleeping 3 0 0 0 0  Tired, decreased energy 2 0 2 0 0  Change in appetite 3 0  0 0 0  Feeling bad or failure about yourself  2 0 0 0 0  Trouble concentrating 2 0 0 0 0  Moving slowly or fidgety/restless 2 0 0 0 0  Suicidal thoughts 0 0 0 0 0  PHQ-9 Score 18 0 2 0 0  Difficult doing work/chores  Not difficult at all Not difficult at all Not difficult at all Not difficult at all   Atrial fibrillation: Treated with flecainide, carvedilol.  No anticoagulation.  No CP/palpitations.  On lower dose synthroid since hospitalization. 161mg QD.  Lab Results  Component Value Date   TSH 0.338 (L) 05/09/2022        History Patient Active Problem List   Diagnosis Date Noted   AK[place] ([place] kidney [place]) ([place] 05/09/2022   Vitamin D deficiency 03/31/2022   COPD GOLD 0/ emphysema on CT  03/15/2019   Visit for preventive health examination 02/03/2018    Prostate cancer screening 02/03/2018   Colon cancer screening 02/03/2018   Need for Tdap vaccination 02/03/2018   PVC's (premature ventricular contractions) 01/06/2017   Bone neoplasm 02/25/2015   Cigarette smoker 08/07/2013   Depression with anxiety 02/20/2012   ED (erectile dysfunction) 02/20/2012   Hx of Bell's palsy 02/20/2012   [place]ated toe ([facility] 02/20/2012   Paroxysmal atrial fibrillation ([facility] 01/15/2011   Hypothyroidism following radioiodine therapy 01/15/2011   Essential hypertension 01/15/2011   Dyslipidemia 01/15/2011   Past Medical History:  Diagnosis Date   Allergy    Anxiety    Atrial fibrillation (HCC)    Bell palsy    Depression    Diastolic heart failure    Hypercholesterolemia    Hypertension    Pneumonia    Thyroid disease    Past Surgical History:  Procedure Laterality Date   CHOLECYSTECTOMY     HERNIA REPAIR     KNEE SURGERY     VASECTOMY     Allergies  Allergen Reactions   Sulfa Drugs Cross Reactors Hives   Codeine Other (See Comments)    Made him very jittery    Hydrochlorothiazide Other (See Comments)    Hyponatremia   Prior to Admission medications   Medication Sig Start Date End Date Taking? Authorizing Provider  albuterol (PROVENTIL) (2.5 MG/3ML) 0.083% nebulizer solution Take 3 mLs (2.5 mg total) by nebulization every 6 (six) hours as needed for wheezing or shortness of breath. 08/17/18  Yes MBrunetta Jeans PA-C  albuterol (VENTOLIN HFA) 108 (90 Base) MCG/ACT inhaler INHALE 2 PUFFS INTO THE LUNGS EVERY 6 HOURS AS NEEDED FOR WHEEZING/SHORTNESS OF BREATH Patient taking differently: Inhale 2 puffs into the lungs every 6 (six) hours as needed for shortness of breath. 04/24/22  Yes GWendie Agreste MD  amoxicillin-clavulanate (AUGMENTIN) 500-125 MG tablet Take 1 tablet (500 mg total) by mouth 3 (three) times daily for 7 days. 05/10/22 05/17/22 Yes Shah, Pratik D, DO  carvedilol (COREG) 12.5 MG tablet TAKE 1.5 TABLETS (18.75 MG TOTAL) BY MOUTH 2  (TWO) TIMES DAILY WITH A MEAL. 05/13/22  Yes TEvans Lance MD  cloNIDine (CATAPRES - DOSED IN MG/24 HR) 0.1 mg/24hr patch PLACE 1 PATCH ONTO THE SKIN ONCE A WEEK 02/04/22  Yes KElayne Snare MD  levothyroxine (SYNTHROID) 125 MCG tablet Take 1 tablet (125 mcg total) by mouth daily. 05/10/22 05/10/23 Yes Shah, Pratik D, DO  lisinopril (ZESTRIL) 40 MG tablet TAKE 1 TABLET BY MOUTH EVERY DAY 05/13/22  Yes TEvans Lance MD  montelukast (SINGULAIR) 10 MG tablet TAKE 1 TABLET BY  MOUTH EVERYDAY AT BEDTIME Patient taking differently: Take 10 mg by mouth at bedtime. 04/01/22  Yes Maximiano Coss, NP  ondansetron (ZOFRAN ODT) 8 MG disintegrating tablet Take 1 tablet (8 mg total) by mouth every 8 (eight) hours as needed. 01/06/22  Yes Maximiano Coss, NP  simvastatin (ZOCOR) 40 MG tablet TAKE 1 TABLET BY MOUTH EVERY DAY 01/05/22  Yes Maximiano Coss, NP  tamsulosin (FLOMAX) 0.4 MG CAPS capsule TAKE 2 CAPSULES BY MOUTH EVERY DAY 04/24/22  Yes Wendie Agreste, MD  aspirin 81 MG tablet Take 81 mg by mouth every other day. Patient not taking: Reported on 05/09/2022    [provider]  azelastine (ASTELIN) 0.1 % nasal spray Place 1 spray into both nostrils 2 (two) times daily. Use in each nostril as directed Patient not taking: Reported on 05/09/2022 06/26/21   Maximiano Coss, NP  Cholecalciferol (VITAMIN D3 PO) Take 1 tablet by mouth daily. Patient not taking: Reported on 05/15/2022    [provider]  dextromethorphan-guaiFENesin (MUCINEX DM) 30-600 MG 12hr tablet Take 1 tablet by mouth 2 (two) times daily for 7 days. Patient not taking: Reported on 05/15/2022 05/10/22 05/17/22  Heath Lark D, DO  flecainide (TAMBOCOR) 100 MG tablet TAKE 1 TABLET BY MOUTH TWICE A DAY Patient not taking: Reported on 05/15/2022 10/10/21   Evans Lance, MD  hydrOXYzine (ATARAX) 10 MG tablet Take 0.5-1 tablets (5-10 mg total) by mouth 3 (three) times daily as needed. Patient not taking: Reported on 05/15/2022 02/05/22    Maximiano Coss, NP  pantoprazole (PROTONIX) 40 MG tablet TAKE 1 TABLET BY MOUTH EVERY DAY Patient not taking: Reported on 05/09/2022 11/06/21   Maximiano Coss, NP  Vitamin D, Ergocalciferol, (DRISDOL) 1.25 MG (50000 UNIT) CAPS capsule Take 1 capsule (50,000 Units total) by mouth every 7 (seven) days. Patient not taking: Reported on 05/15/2022 01/06/22   Maximiano Coss, NP   Social History   Socioeconomic History   Marital status: Married    Spouse name: Not on file   Number of children: 1   Years of education: Not on file   Highest education level: Not on file  Occupational History   Not on file  Tobacco Use   Smoking status: Every Day    Packs/day: 0.50    Years: 40.00    Total pack years: 20.00    Types: Cigarettes    Start date: 09/14/1978   Smokeless tobacco: Never  Vaping Use   Vaping Use: Never used  Substance and Sexual Activity   Alcohol use: Yes    Alcohol/week: 0.0 standard drinks of alcohol    Comment: rarely   Drug use: No   Sexual activity: Never    Birth control/protection: None  Other Topics Concern   Not on file  Social History Narrative   Marital status: married x 30+ years      Children:  2 children;(38,37); 3+3 grandchild      Lives:  With wife, son      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   Objective:   Vitals:   05/15/22 1211  BP: 130/80  Pulse: (!) 117  Temp: 97.8 F (36.6 C)  SpO2: 96%  Weight: 192 lb 6.4 oz (87.3 kg)  Height: '5\' 9"'$  (1.753 m)    Physical Exam Vitals reviewed.  Constitutional:       Appearance: He is well-developed.  HENT:     Head: Normocephalic and atraumatic.  Neck:     Vascular: No carotid bruit or JVD.  Cardiovascular:     Rate and Rhythm: Tachycardia present. Rhythm irregular.     Heart sounds: Normal heart sounds. No murmur heard. Pulmonary:     Effort: Pulmonary effort is normal.     Breath sounds: Normal breath sounds. No rales.     Comments: Lungs clear. Normal effort, no distress.  Musculoskeletal:     Right lower leg: No edema.     Left lower leg: No edema.  Skin:    General: Skin is warm and dry.  Neurological:     Mental Status: He is alert and oriented to person, place, and time.  Psychiatric:     Comments: Appears anxious, appropriate responses, coherent responses.  Does not appear to be responding to internal stimuli.  Denies suicidal or homicidal ideation or active A/V/T hallucinations or delusions.    EKG, atrial fibrillation, rate 108.  Baseline artifact, but no apparent acute ST or T wave changes or significant changes from 05/12/2022.  Assessment & Plan:  RUBERT FREDIANI [name] is a 61 y.o. male . Psychophysiological insomnia - Plan: QUEtiapine (SEROQUEL) 25 MG tablet  Situational anxiety - Plan: sertraline (ZOLOFT) 50 MG tablet  Depression with anxiety - Plan: sertraline (ZOLOFT) 50 MG tablet  Paroxysmal atrial fibrillation (HCC) - Plan: EKG 12-Lead  Tachycardia - Plan: EKG 12-Lead  Pneumonia of right lower lobe due to infectious organism  AKI (acute kidney injury) ([place])  Hypothyroidism following radioiodine therapy  Low TSH level  Need for influenza vaccination - Plan: Flu Vaccine QUAD 6+ mos PF IM (Fluarix Quad PF)   Meds ordered this encounter  Medications   sertraline (ZOLOFT) 50 MG tablet    Sig: Take 1 tablet (50 mg total) by mouth daily.    Dispense:  30 tablet    Refill:  3   QUEtiapine (SEROQUEL) 25 MG tablet    Sig: Take 1-2 tablets (25-50 mg total) by mouth at bedtime as needed.    Dispense:  30 tablet     Refill:  0   Patient Instructions  Restart sertraline once per day.  For sleep, can try seroquel once at bedtime. If no relief with 1, then take additional dose.  Do not take any other sleep medications with this medicine.  You are in atrial fibrillation today. That may be due to abnormal thyroid test and recent pneumonia. Continue flecainide, carvedilol. If any lightheadedness, dizziness, chest pains or new symptoms go to ER.   Continue to drink sufficient fluids, and continue antibiotic for pneumonia. Recheck next week as we discussed, but if unable to sleep tonight or worsening anxiety, please be seen at behavioral health or emergency room if needed. Hang in there!  Mental health hotline: [place]: [place], [place] [address] HelpLine: 518-339-9275 or 1-(980)128-5217  Insomnia Insomnia is a sleep disorder that makes it difficult to fall asleep or stay asleep. Insomnia can cause fatigue, low energy, difficulty concentrating, mood swings,  and poor performance at work or school. There are three different ways to classify insomnia: Difficulty falling asleep. Difficulty staying asleep. Waking up too early in the morning. Any type of insomnia can be long-term (chronic) or short-term (acute). Both are common. Short-term insomnia usually lasts for 3 months or less. Chronic insomnia occurs at least three times a week for longer than 3 months. What are the causes? Insomnia may be caused by another condition, situation, or substance, such as: Having certain mental health conditions, such as anxiety and depression. Using caffeine, alcohol, tobacco, or drugs. Having gastrointestinal conditions, such as gastroesophageal reflux disease (GERD). Having certain medical conditions. These include: Asthma. Alzheimer's disease. Stroke. Chronic pain. An overactive thyroid gland (hyperthyroidism). Other sleep disorders, such as restless legs syndrome and sleep  apnea. Menopause. Sometimes, the cause of insomnia may not be known. What increases the risk? Risk factors for insomnia include: Gender. Females are affected more often than males. Age. Insomnia is more common as people get older. Stress and certain medical and mental health conditions. Lack of exercise. Having an irregular work schedule. This may include working night shifts and traveling between different time zones. What are the signs or symptoms? If you have insomnia, the main symptom is having trouble falling asleep or having trouble staying asleep. This may lead to other symptoms, such as: Feeling tired or having low energy. Feeling nervous about going to sleep. Not feeling rested in the morning. Having trouble concentrating. Feeling irritable, anxious, or depressed. How is this diagnosed? This condition may be diagnosed based on: Your symptoms and medical history. Your health care provider may ask about: Your sleep habits. Any medical conditions you have. Your mental health. A physical exam. How is this treated? Treatment for insomnia depends on the cause. Treatment may focus on treating an underlying condition that is causing the insomnia. Treatment may also include: Medicines to help you sleep. Counseling or therapy. Lifestyle adjustments to help you sleep better. Follow these instructions at home: Eating and drinking  Limit or avoid alcohol, caffeinated beverages, and products that contain nicotine and tobacco, especially close to bedtime. These can disrupt your sleep. Do not eat a large meal or eat spicy foods right before bedtime. This can lead to digestive discomfort that can make it hard for you to sleep. Sleep habits  Keep a sleep diary to help you and your health care provider figure out what could be causing your insomnia. Write down: When you sleep. When you wake up during the night. How well you sleep and how rested you feel the next day. Any side effects of  medicines you are taking. What you eat and drink. Make your bedroom a dark, comfortable place where it is easy to fall asleep. Put up shades or blackout curtains to block light from outside. Use a white noise machine to block noise. Keep the temperature cool. Limit screen use before bedtime. This includes: Not watching TV. Not using your smartphone, tablet, or computer. Stick to a routine that includes going to bed and waking up at the same times every day and night. This can help you fall asleep faster. Consider making a quiet activity, such as reading, part of your nighttime routine. Try to avoid taking naps during the day so that you sleep better at night. Get out of bed if you are still awake after 15 minutes of trying to sleep. Keep the lights down, but try reading or doing a quiet activity. When you feel sleepy, go back to  bed. General instructions Take over-the-counter and prescription medicines only as told by your health care provider. Exercise regularly as told by your health care provider. However, avoid exercising in the hours right before bedtime. Use relaxation techniques to manage stress. Ask your health care provider to suggest some techniques that may work well for you. These may include: Breathing exercises. Routines to release muscle tension. Visualizing peaceful scenes. Make sure that you drive carefully. Do not drive if you feel very sleepy. Keep all follow-up visits. This is important. Contact a health care provider if: You are tired throughout the day. You have trouble in your daily routine due to sleepiness. You continue to have sleep problems, or your sleep problems get worse. Get help right away if: You have thoughts about hurting yourself or someone else. Get help right away if you feel like you may hurt yourself or others, or have thoughts about taking your own life. Go to your nearest emergency room or: Call 911. Call the [facility]  at 629-296-1850 or 988. This is open 24 hours a day. Text the Crisis Text Line at 4371463357. Summary Insomnia is a sleep disorder that makes it difficult to fall asleep or stay asleep. Insomnia can be long-term (chronic) or short-term (acute). Treatment for insomnia depends on the cause. Treatment may focus on treating an underlying condition that is causing the insomnia. Keep a sleep diary to help you and your health care provider figure out what could be causing your insomnia. This information is not intended to replace advice given to you by your health care provider. Make sure you discuss any questions you have with your health care provider. Document Revised: 08/11/2021 Document Reviewed: 08/11/2021 Elsevier Patient Education  [place], Adult After being diagnosed with anxiety, you may be relieved to know why you have felt or behaved a certain way. You may also feel overwhelmed about the treatment ahead and what it will mean for your life. With care and support, you can manage this condition. How to manage lifestyle changes Managing stress and anxiety  Stress is your body's reaction to life changes and events, both good and bad. Most stress will last just a few hours, but stress can be ongoing and can lead to more than just stress. Although stress can play a major role in anxiety, it is not the same as anxiety. Stress is usually caused by something external, such as a deadline, test, or competition. Stress normally passes after the triggering event has ended.  Anxiety is caused by something internal, such as imagining a terrible outcome or worrying that something will go wrong that will devastate you. Anxiety often does not go away even after the triggering event is over, and it can become long-term (chronic) worry. It is important to understand the differences between stress and anxiety and to manage your stress effectively so that it does not lead to an anxious  response. Talk with your health care provider or a counselor to learn more about reducing anxiety and stress. He or she may suggest tension reduction techniques, such as: Music therapy. Spend time creating or listening to music that you enjoy and that inspires you. Mindfulness-based meditation. Practice being aware of your normal breaths while not trying to control your breathing. It can be done while sitting or walking. Centering prayer. This involves focusing on a word, phrase, or sacred image that means something to you and brings you peace. Deep breathing. To do this, expand  your stomach and inhale slowly through your nose. Hold your breath for 3-5 seconds. Then exhale slowly, letting your stomach muscles relax. Self-talk. Learn to notice and identify thought patterns that lead to anxiety reactions and change those patterns to thoughts that feel peaceful. Muscle relaxation. Taking time to tense muscles and then relax them. Choose a tension reduction technique that fits your lifestyle and personality. These techniques take time and practice. Set aside 5-15 minutes a day to do them. Therapists can offer counseling and training in these techniques. The training to help with anxiety may be covered by some insurance plans. Other things you can do to manage stress and anxiety include: Keeping a stress diary. This can help you learn what triggers your reaction and then learn ways to manage your response. Thinking about how you react to certain situations. You may not be able to control everything, but you can control your response. Making time for activities that help you relax and not feeling guilty about spending your time in this way. Doing visual imagery. This involves imagining or creating mental pictures to help you relax. Practicing yoga. Through yoga poses, you can lower tension and promote relaxation.  Medicines Medicines can help ease symptoms. Medicines for anxiety include: Antidepressant  medicines. These are usually prescribed for long-term daily control. Anti-anxiety medicines. These may be added in severe cases, especially when panic attacks occur. Medicines will be prescribed by a health care provider. When used together, medicines, psychotherapy, and tension reduction techniques may be the most effective treatment. Relationships Relationships can play a big part in helping you recover. Try to spend more time connecting with trusted friends and family members. Consider going to couples counseling if you have a partner, taking family education classes, or going to family therapy. Therapy can help you and others better understand your condition. How to recognize changes in your anxiety Everyone responds differently to treatment for anxiety. Recovery from anxiety happens when symptoms decrease and stop interfering with your daily activities at home or work. This may mean that you will start to: Have better concentration and focus. Worry will interfere less in your daily thinking. Sleep better. Be less irritable. Have more energy. Have improved memory. It is also important to recognize when your condition is getting worse. Contact your health care provider if your symptoms interfere with home or work and you feel like your condition is not improving. Follow these instructions at home: Activity Exercise. Adults should do the following: Exercise for at least 150 minutes each week. The exercise should increase your heart rate and make you sweat (moderate-intensity exercise). Strengthening exercises at least twice a week. Get the right amount and quality of sleep. Most adults need 7-9 hours of sleep each night. Lifestyle  Eat a healthy diet that includes plenty of vegetables, fruits, whole grains, low-fat dairy products, and lean protein. Do not eat a lot of foods that are high in fats, added sugars, or salt (sodium). Make choices that simplify your life. Do not use any products  that contain nicotine or tobacco. These products include cigarettes, chewing tobacco, and vaping devices, such as e-cigarettes. If you need help quitting, ask your health care provider. Avoid caffeine, alcohol, and certain over-the-counter cold medicines. These may make you feel worse. Ask your pharmacist which medicines to avoid. General instructions Take over-the-counter and prescription medicines only as told by your health care provider. Keep all follow-up visits. This is important. Where to find support You can get help and support from these  sources: Self-help groups. Online and [employer]. A trusted spiritual leader. Couples counseling. Family education classes. Family therapy. Where to find more information You may find that joining a support group helps you deal with your anxiety. The following sources can help you locate counselors or support groups near you: [place]: www.mentalhealthamerica.net Anxiety and Depression Association of [place] (ADAA): https://www.clark.net/ National Alliance on Mental Illness (NAMI): www.nami.org Contact a health care provider if: You have a hard time staying focused or finishing daily tasks. You spend many hours a day feeling worried about everyday life. You become exhausted by worry. You start to have headaches or frequently feel tense. You develop chronic nausea or diarrhea. Get help right away if: You have a racing heart and shortness of breath. You have thoughts of hurting yourself or others. If you ever feel like you may hurt yourself or others, or have thoughts about taking your own life, get help right away. Go to your nearest emergency department or: Call your local emergency services (911 in the [place] Call a suicide crisis helpline, such as the [place] at 7180516479 or 988 in the [place]. This is open 24 hours a day in the [place] Text the Crisis Text Line at 9106396538 (in the  [place] Summary Taking steps to learn and use tension reduction techniques can help calm you and help prevent triggering an anxiety reaction. When used together, medicines, psychotherapy, and tension reduction techniques may be the most effective treatment. Family, friends, and partners can play a big part in supporting you. This information is not intended to replace advice given to you by your health care provider. Make sure you discuss any questions you have with your health care provider. Document Revised: 03/26/2021 Document Reviewed: 12/22/2020 Elsevier Patient Education  [place],   Merri Ray, MD [place] 05/15/22 2:10 PM

## 2022-05-16 ENCOUNTER — Encounter: Payer: Self-pay | Admitting: Family Medicine

## 2022-05-22 ENCOUNTER — Other Ambulatory Visit: Payer: Self-pay | Admitting: Family Medicine

## 2022-05-22 DIAGNOSIS — F5104 Psychophysiologic insomnia: Secondary | ICD-10-CM

## 2022-05-25 ENCOUNTER — Emergency Department (HOSPITAL_COMMUNITY)
Admission: EM | Admit: 2022-05-25 | Discharge: 2022-05-25 | Disposition: A | Discharge status: Home / Self Care | Payer: 59 | Attending: Emergency Medicine | Admitting: Emergency Medicine

## 2022-05-25 ENCOUNTER — Other Ambulatory Visit: Payer: Self-pay

## 2022-05-25 ENCOUNTER — Encounter (HOSPITAL_COMMUNITY): Payer: Self-pay | Admitting: *Deleted

## 2022-05-25 ENCOUNTER — Emergency Department (HOSPITAL_COMMUNITY): Payer: 59

## 2022-05-25 ENCOUNTER — Ambulatory Visit: Payer: 59 | Admitting: Family Medicine

## 2022-05-25 DIAGNOSIS — R002 Palpitations: Secondary | ICD-10-CM | POA: Diagnosis present

## 2022-05-25 DIAGNOSIS — R059 Cough, unspecified: Secondary | ICD-10-CM | POA: Insufficient documentation

## 2022-05-25 DIAGNOSIS — I10 Essential (primary) hypertension: Secondary | ICD-10-CM

## 2022-05-25 DIAGNOSIS — E890 Postprocedural hypothyroidism: Secondary | ICD-10-CM

## 2022-05-25 LAB — CBC WITH DIFFERENTIAL/PLATELET
Abs Immature Granulocytes: 0.08 10*3/uL — ABNORMAL HIGH (ref 0.00–0.07)
Basophils Absolute: 0 10*3/uL (ref 0.0–0.1)
Basophils Relative: 0 %
Eosinophils Absolute: 0 10*3/uL (ref 0.0–0.5)
Eosinophils Relative: 0 %
HCT: 44 % (ref 39.0–52.0)
Hemoglobin: 14.9 g/dL (ref 13.0–17.0)
Immature Granulocytes: 1 %
Lymphocytes Relative: 15 %
Lymphs Abs: 2.2 10*3/uL (ref 0.7–4.0)
MCH: 31.1 pg (ref 26.0–34.0)
MCHC: 33.9 g/dL (ref 30.0–36.0)
MCV: 91.9 fL (ref 80.0–100.0)
Monocytes Absolute: 1.2 10*3/uL — ABNORMAL HIGH (ref 0.1–1.0)
Monocytes Relative: 8 %
Neutro Abs: 11.1 10*3/uL — ABNORMAL HIGH (ref 1.7–7.7)
Neutrophils Relative %: 76 %
Platelets: 322 10*3/uL (ref 150–400)
RBC: 4.79 MIL/uL (ref 4.22–5.81)
RDW: 13.8 % (ref 11.5–15.5)
WBC: 14.6 10*3/uL — ABNORMAL HIGH (ref 4.0–10.5)
nRBC: 0 % (ref 0.0–0.2)

## 2022-05-25 LAB — COMPREHENSIVE METABOLIC PANEL
ALT: 12 U/L (ref 0–44)
AST: 12 U/L — ABNORMAL LOW (ref 15–41)
Albumin: 4 g/dL (ref 3.5–5.0)
Alkaline Phosphatase: 69 U/L (ref 38–126)
Anion gap: 8 (ref 5–15)
BUN: 10 mg/dL (ref 8–23)
CO2: 27 mmol/L (ref 22–32)
Calcium: 9.1 mg/dL (ref 8.9–10.3)
Chloride: 99 mmol/L (ref 98–111)
Creatinine, Ser: 0.69 mg/dL (ref 0.61–1.24)
GFR, Estimated: >60 mL/min (ref >60)
Glucose, Bld: 109 mg/dL — ABNORMAL HIGH (ref 70–99)
Potassium: 4.3 mmol/L (ref 3.5–5.1)
Sodium: 134 mmol/L — ABNORMAL LOW (ref 135–145)
Total Bilirubin: 0.8 mg/dL (ref 0.3–1.2)
Total Protein: 6.7 g/dL (ref 6.5–8.1)

## 2022-05-25 LAB — TROPONIN I (HIGH SENSITIVITY)
Troponin I (High Sensitivity): 2 ng/L (ref <18)
Troponin I (High Sensitivity): 3 ng/L (ref <18)

## 2022-05-25 MED ORDER — RIVAROXABAN 20 MG PO TABS: 20.0000 mg | ORAL_TABLET | Freq: Every day | ORAL | 0 refills | Status: DC

## 2022-05-25 NOTE — ED Provider Notes (Signed)
[facility] Provider Note   CSN: [id number] Arrival date & time: 05/25/22  1118     History Chief Complaint  Patient presents with   Palpitations    HPI Jesse CHERAMIE [name] is a 61 y.o. male presenting for palpitations.  He states that he has had intermittent palpitations over the past 4 days.  He has a history of A-fib though it has been controlled for years.  He has been out of A-fib for years and so is no longer on anticoagulation.  He denies fevers or chills nausea vomiting syncope shortness of breath.  He does have a cough and feels that he has out of developing upper respiratory infection.  No chest pain at this time.  Patient's recorded medical, surgical, social, medication list and allergies were reviewed in the Snapshot window as part of the initial history.   Review of Systems   Review of Systems  Constitutional:  Negative for chills and fever.  HENT:  Negative for ear pain and sore throat.   Eyes:  Negative for pain and visual disturbance.  Respiratory:  Negative for cough and shortness of breath.   Cardiovascular:  Positive for palpitations. Negative for chest pain.  Gastrointestinal:  Negative for abdominal pain and vomiting.  Genitourinary:  Negative for dysuria and hematuria.  Musculoskeletal:  Negative for arthralgias and back pain.  Skin:  Negative for color change and rash.  Neurological:  Negative for seizures and syncope.  All other systems reviewed and are negative.   Physical Exam Updated Vital Signs BP 97/83   Pulse (!) 120   Temp 98.1 F (36.7 C) (Oral)   Resp (!) 21   Ht '5\' 9"'$  (1.753 m)   Wt 87.2 kg   SpO2 94%   BMI 28.39 kg/m  Physical Exam Vitals and nursing note reviewed.  Constitutional:      General: He is not in acute distress.    Appearance: He is well-developed.  HENT:     Head: Normocephalic and atraumatic.  Eyes:     Conjunctiva/sclera: Conjunctivae normal.  Cardiovascular:     Rate and Rhythm: Normal  rate and regular rhythm.     Heart sounds: No murmur heard. Pulmonary:     Effort: Pulmonary effort is normal. No respiratory distress.     Breath sounds: Normal breath sounds.  Abdominal:     Palpations: Abdomen is soft.     Tenderness: There is no abdominal tenderness.  Musculoskeletal:        General: No swelling.     Cervical back: Neck supple.  Skin:    General: Skin is warm and dry.     Capillary Refill: Capillary refill takes less than 2 seconds.  Neurological:     Mental Status: He is alert.  Psychiatric:        Mood and Affect: Mood normal.      ED Course/ Medical Decision Making/ A&P    Procedures Procedures   Medications Ordered in ED Medications - No data to display  Medical Decision Making:    Jesse DAWSON [name] is a 61 y.o. male who presented to the ED today with palpitations detailed above.     Patient's presentation is complicated by their history of multiple comorbid medical problems.  Patient placed on continuous vitals and telemetry monitoring while in ED which was reviewed periodically.   Complete initial physical exam performed, notably the patient  was hemodynamically stable in no acute distress.      Reviewed  and confirmed nursing documentation for past medical history, family history, social history.    Initial Assessment:   With the patient's presentation of palpitations, most likely diagnosis is recurrent of their atrial fibrillation. Other diagnoses were considered including (but not limited to) underlying infection including pneumonia, pneumothorax, ACS, pulmonary embolism. These are considered less likely due to history of present illness and physical exam findings.   This is most consistent with an acute life/limb threatening illness complicated by underlying chronic conditions.  Initial Plan:   Screening labs including CBC and Metabolic panel to evaluate for infectious or metabolic etiology of disease.  Urinalysis with reflex culture  ordered to evaluate for UTI or relevant urologic/nephrologic pathology.  CXR to evaluate for structural/infectious intrathoracic pathology.  Troponin and EKG to evaluate for cardiac pathology. Objective evaluation as below reviewed with plan for close reassessment  Initial Study Results:   Laboratory  All laboratory results reviewed without evidence of clinically relevant pathology.     EKG EKG was reviewed independently. Rate, rhythm, axis, intervals all examined and without medically relevant abnormality. ST segments without concerns for elevations.  Patient is in atrial fibrillation without RVR  Radiology  All images reviewed independently. Agree with radiology report at this time.   DG Chest Portable 1 View  Result Date: 05/25/2022 CLINICAL DATA:  Cardiac palpitations. EXAM: PORTABLE CHEST 1 VIEW COMPARISON:  May 10, 2022. FINDINGS: The heart size and mediastinal contours are within normal limits. Both lungs are clear. Stable elevated right hemidiaphragm. The visualized skeletal structures are unremarkable. IMPRESSION: No active disease. Electronically Signed   By: Marijo Conception M.D.   On: 05/25/2022 13:14    Final Assessment and Plan:   Patient's history of present illness episodes and findings are most consistent with likely viral upper respiratory infection triggering a recurrence of their A-fib RVR.  No evidence of acute coronary syndrome on delta troponin and patient otherwise ambulatory tolerating p.o. intake.  They are requesting discharge.  Discussed the findings of today including new recurrence of their A-fib and recommended that they likely need to have a conversation about the reinitiation of Xarelto.  We will represcribe Xarelto.  Patient states he wants to talk to his cardiologist about it before he starts it which is reasonable however informed him of the risk of stroke if he does not follow-up closely with his cardiologist. Patient otherwise ambulatory tolerating p.o.  intake at this time in no acute distress stable for outpatient care and management.   Clinical Impression:  1. Palpitations      Discharge   Final Clinical Impression(s) / ED Diagnoses Final diagnoses:  Palpitations    Rx / DC Orders ED Discharge Orders          Ordered    rivaroxaban (XARELTO) 20 MG TABS tablet  Daily with supper        05/25/22 1622              Tretha Sciara, MD 05/25/22 1625

## 2022-05-25 NOTE — ED Triage Notes (Signed)
Pt brought in by [facility] for c/o feeling like his heart is racing and not feeling well;  pt was diagnosed with covid last week and given antiviral but states he still does not feel well

## 2022-07-06 ENCOUNTER — Other Ambulatory Visit (INDEPENDENT_AMBULATORY_CARE_PROVIDER_SITE_OTHER): Payer: 59

## 2022-07-06 DIAGNOSIS — I10 Essential (primary) hypertension: Secondary | ICD-10-CM

## 2022-07-06 DIAGNOSIS — E890 Postprocedural hypothyroidism: Secondary | ICD-10-CM

## 2022-07-06 LAB — BASIC METABOLIC PANEL
BUN: 11 mg/dL (ref 6–23)
CO2: 29 mEq/L (ref 19–32)
Calcium: 9.3 mg/dL (ref 8.4–10.5)
Chloride: 100 mEq/L (ref 96–112)
Creatinine, Ser: 0.78 mg/dL (ref 0.40–1.50)
GFR: 94.87 mL/min (ref >60.00)
Glucose, Bld: 112 mg/dL — ABNORMAL HIGH (ref 70–99)
Potassium: 4.8 mEq/L (ref 3.5–5.1)
Sodium: 135 mEq/L (ref 135–145)

## 2022-07-06 LAB — T4, FREE: Free T4: 0.93 ng/dL (ref 0.60–1.60)

## 2022-07-06 LAB — TSH: TSH: 8.41 u[IU]/mL — ABNORMAL HIGH (ref 0.35–5.50)

## 2022-07-08 ENCOUNTER — Ambulatory Visit: Payer: 59 | Admitting: Endocrinology

## 2022-07-08 ENCOUNTER — Other Ambulatory Visit: Payer: Self-pay | Admitting: Family Medicine

## 2022-07-08 ENCOUNTER — Encounter: Payer: Self-pay | Admitting: Endocrinology

## 2022-07-08 VITALS — BP 138/90 | HR 65 | Ht 69.0 in | Wt 194.7 lb

## 2022-07-08 DIAGNOSIS — Z23 Encounter for immunization: Secondary | ICD-10-CM

## 2022-07-08 DIAGNOSIS — I10 Essential (primary) hypertension: Secondary | ICD-10-CM

## 2022-07-08 DIAGNOSIS — F418 Other specified anxiety disorders: Secondary | ICD-10-CM | POA: Diagnosis not present

## 2022-07-08 DIAGNOSIS — F5104 Psychophysiologic insomnia: Secondary | ICD-10-CM

## 2022-07-08 DIAGNOSIS — E890 Postprocedural hypothyroidism: Secondary | ICD-10-CM | POA: Diagnosis not present

## 2022-07-08 MED ORDER — SERTRALINE HCL 50 MG PO TABS: 50.0000 mg | ORAL_TABLET | Freq: Every day | ORAL | 1 refills | Status: DC

## 2022-07-08 NOTE — Progress Notes (Signed)
Patient ID: Jesse Europe [name], male   DOB: 1961-08-24, 61 y.o.   MRN: [id number]   Reason for Appointment: Follow-up    History of Present Illness:   The hypothyroidism was first diagnosed in 11/13. Previously had I-131 treatment for Graves' disease He was initially started with 88 mcg but his dose needed to be increased progressively  He has been treated with brand name SYNTHROID, the dose has been adjusted previously based on his labs  He is taking 175 mcg brand-name Synthroid However his dose has been markedly variable between 125 and 175 Not clear why he has gone up on the dose recently even though his TSH in the hospital was low On his last visit he was recommended 150 mcg  He does feel tired because of the depression and is stressed  Weight is about the same He thinks he is getting brand-name Synthroid but the prescription seems to be generic He takes this on empty stomach before breakfast He does not think he has missed any doses Not clear why his TSH has gone up again   Wt Readings from Last 3 Encounters:  07/08/22 194 lb 11.2 oz (88.3 kg)  05/25/22 192 lb 3.9 oz (87.2 kg)  05/15/22 192 lb 6.4 oz (87.3 kg)    LABS:   Lab Results  Component Value Date   TSH 8.41 (H) 07/06/2022   TSH 0.338 (L) 05/09/2022   TSH 9.63 (H) 03/09/2022   FREET4 0.93 07/06/2022   FREET4 1.56 (H) 05/09/2022   FREET4 1.01 03/09/2022      HYPERTENSION:  He has had long-standing hypertension which is  managed with lisinopril 40, Catapres 0.1 mg patch and Coreg 1-1/2 tablets twice a day He was told to stop hydrochlorothiazide which was causing hyponatremia  He was evaluated for secondary hypertension and reports of his aldosterone/renin indicate normal levels Also was supposed to get the renal artery ultrasound but this was not done  He has checked his blood pressure regularly at home with the [employer] monitor on his arm  Home his blood pressure is usually normal around  118/75-80  May be higher today because of being stressed    Blood pressure is :  BP Readings from Last 3 Encounters:  07/08/22 (!) 138/90  05/25/22 97/83  05/15/22 130/80       Allergies as of 07/08/2022       Reactions   Sulfa Drugs Cross Reactors Hives   Codeine Other (See Comments)   Made him very jittery    Hydrochlorothiazide Other (See Comments)   Hyponatremia        Medication List        Accurate as of July 08, 2022 10:52 AM. If you have any questions, ask your nurse or doctor.          albuterol (2.5 MG/3ML) 0.083% nebulizer solution Commonly known as: PROVENTIL Take 3 mLs (2.5 mg total) by nebulization every 6 (six) hours as needed for wheezing or shortness of breath. What changed: Another medication with the same name was changed. Make sure you understand how and when to take each.   albuterol 108 (90 Base) MCG/ACT inhaler Commonly known as: VENTOLIN HFA INHALE 2 PUFFS INTO THE LUNGS EVERY 6 HOURS AS NEEDED FOR WHEEZING/SHORTNESS OF BREATH What changed: See the new instructions.   aspirin 81 MG tablet Take 81 mg by mouth every other day.   azelastine 0.1 % nasal spray Commonly known as: ASTELIN Place 1 spray into both nostrils  2 (two) times daily. Use in each nostril as directed   carvedilol 12.5 MG tablet Commonly known as: COREG TAKE 1.5 TABLETS (18.75 MG TOTAL) BY MOUTH 2 (TWO) TIMES DAILY WITH A MEAL.   cloNIDine 0.1 mg/24hr patch Commonly known as: CATAPRES - Dosed in mg/24 hr PLACE 1 PATCH ONTO THE SKIN ONCE A WEEK   flecainide 100 MG tablet Commonly known as: TAMBOCOR TAKE 1 TABLET BY MOUTH TWICE A DAY   hydrOXYzine 10 MG tablet Commonly known as: ATARAX Take 0.5-1 tablets (5-10 mg total) by mouth 3 (three) times daily as needed.   levothyroxine 125 MCG tablet Commonly known as: Synthroid Take 1 tablet (125 mcg total) by mouth daily.   lisinopril 40 MG tablet Commonly known as: ZESTRIL TAKE 1 TABLET BY MOUTH EVERY DAY    montelukast 10 MG tablet Commonly known as: SINGULAIR TAKE 1 TABLET BY MOUTH EVERYDAY AT BEDTIME What changed: See the new instructions.   ondansetron 8 MG disintegrating tablet Commonly known as: Zofran ODT Take 1 tablet (8 mg total) by mouth every 8 (eight) hours as needed.   pantoprazole 40 MG tablet Commonly known as: PROTONIX TAKE 1 TABLET BY MOUTH EVERY DAY   QUEtiapine 25 MG tablet Commonly known as: SEROquel Take 1-2 tablets (25-50 mg total) by mouth at bedtime as needed.   rivaroxaban 20 MG Tabs tablet Commonly known as: XARELTO Take 1 tablet (20 mg total) by mouth daily with supper.   sertraline 50 MG tablet Commonly known as: ZOLOFT Take 1 tablet (50 mg total) by mouth daily.   simvastatin 40 MG tablet Commonly known as: ZOCOR TAKE 1 TABLET BY MOUTH EVERY DAY   tamsulosin 0.4 MG Caps capsule Commonly known as: FLOMAX TAKE 2 CAPSULES BY MOUTH EVERY DAY   Vitamin D (Ergocalciferol) 1.25 MG (50000 UNIT) Caps capsule Commonly known as: DRISDOL Take 1 capsule (50,000 Units total) by mouth every 7 (seven) days.   VITAMIN D3 PO Take 1 tablet by mouth daily.        Past Medical History:  Diagnosis Date   Allergy    Anxiety    Atrial fibrillation ([place])    Bell palsy    Depression    Diastolic heart failure    Hypercholesterolemia    Hypertension    Pneumonia    Thyroid disease     Past Surgical History:  Procedure Laterality Date   CHOLECYSTECTOMY     HERNIA REPAIR     KNEE SURGERY     VASECTOMY      Family History  Problem Relation Age of Onset   Heart disease Mother    Heart attack Mother    Other Mother        thyroid problems   Kidney cancer Mother    Cirrhosis Father    Other Father        thyroid problems   Alcohol abuse Father    Hypertension Father    Mental illness Brother    Parkinsonism Brother     Social History:  reports that he has been smoking cigarettes. He started smoking about 43 years ago. He has a 20.00  pack-year smoking history. He has never used smokeless tobacco. He reports current alcohol use. He reports that he does not use drugs.  Allergies:  Allergies  Allergen Reactions   Sulfa Drugs Cross Reactors Hives   Codeine Other (See Comments)    Made him very jittery    Hydrochlorothiazide Other (See Comments)    Hyponatremia  REVIEW of systems:  Depression: He says he has not had a refill on sertraline because he does not have a current PCP and is asking about some to help with his moods and sleep  HYPONATREMIA: His sodium had been better with stopping HCTZ  He was told to cut back on drinks like [facility]  He has likely reduced his overall fluid intake as sodium is now consistently normal Probably has mild SIADH  He is currently not taking sertraline 50 mg daily  CT scan of his chest did not show any tumor  Lab Results  Component Value Date   NA 135 07/06/2022   K 4.8 07/06/2022   CL 100 07/06/2022   CO2 29 07/06/2022    Vitamin D deficiency: His recent level in April was about 19 done by PCP    Examination:   BP (!) 138/90   Pulse 65   Ht '5\' 9"'$  (1.753 m)   Wt 194 lb 11.2 oz (88.3 kg)   SpO2 98%   BMI 28.75 kg/m      Assessments    HYPERTENSION:   His blood pressure today is increased but reportedly better at home He will continue to monitor at home  Needs to follow-up with his PCP  Sertraline was refilled but he needs to establish with a new PCP  HYPOTHYROIDISM:  secondary to I-131 treatment  His TSH is surprisingly high even though his dose has been increased He will now take the equivalent of 200 mcg Synthroid with taking an extra pill once a week  Call for prescription for 200 tablet on the next refill  There are no Patient Instructions on file for this visit.  Flu vaccine given  [name] 07/08/2022, 10:52 AM

## 2022-07-08 NOTE — Patient Instructions (Signed)
Take extra 175 pill once a week, call for 200 dose next time

## 2022-07-24 ENCOUNTER — Other Ambulatory Visit: Payer: Self-pay | Admitting: Endocrinology

## 2022-08-14 ENCOUNTER — Ambulatory Visit: Payer: 59 | Attending: Internal Medicine | Admitting: Internal Medicine

## 2022-08-14 VITALS — BP 128/86 | HR 99 | Ht 69.0 in | Wt 195.0 lb

## 2022-08-14 DIAGNOSIS — I493 Ventricular premature depolarization: Secondary | ICD-10-CM

## 2022-08-14 DIAGNOSIS — I480 Paroxysmal atrial fibrillation: Secondary | ICD-10-CM | POA: Diagnosis not present

## 2022-08-14 DIAGNOSIS — I10 Essential (primary) hypertension: Secondary | ICD-10-CM | POA: Diagnosis not present

## 2022-08-14 MED ORDER — RIVAROXABAN 20 MG PO TABS: 20.0000 mg | ORAL_TABLET | Freq: Every day | ORAL | 5 refills | Status: DC

## 2022-08-14 MED ORDER — CARVEDILOL 25 MG PO TABS: 25.0000 mg | ORAL_TABLET | Freq: Two times a day (BID) | ORAL | 3 refills | Status: DC

## 2022-08-14 NOTE — Progress Notes (Signed)
HPI  [name] returns today for followup. He is a pleasant 61 yo man with a h/o PAF, remote Graves disease, HTN, and PVC's. He has been well controlled with regard to his PVC's and his atrial fib. He denies chest pain. He has not had any symptoms. No edema.     Current Outpatient Medications  Medication Sig Dispense Refill   albuterol (PROVENTIL) (2.5 MG/3ML) 0.083% nebulizer solution Take 3 mLs (2.5 mg total) by nebulization every 6 (six) hours as needed for wheezing or shortness of breath. 150 mL 1   albuterol (VENTOLIN HFA) 108 (90 Base) MCG/ACT inhaler INHALE 2 PUFFS INTO THE LUNGS EVERY 6 HOURS AS NEEDED FOR WHEEZING/SHORTNESS OF BREATH (Patient taking differently: Inhale 2 puffs into the lungs every 6 (six) hours as needed for shortness of breath.) 8.5 each 2   aspirin 81 MG tablet Take 81 mg by mouth every other day.     azelastine (ASTELIN) 0.1 % nasal spray Place 1 spray into both nostrils 2 (two) times daily. Use in each nostril as directed 30 mL 12   carvedilol (COREG) 12.5 MG tablet TAKE 1.5 TABLETS (18.75 MG TOTAL) BY MOUTH 2 (TWO) TIMES DAILY WITH A MEAL. 270 tablet 1   Cholecalciferol (VITAMIN D3 PO) Take 1 tablet by mouth daily.     cloNIDine (CATAPRES - DOSED IN MG/24 HR) 0.1 mg/24hr patch APPLY 1 PATCH ONCE A WEEK 12 patch 1   flecainide (TAMBOCOR) 100 MG tablet TAKE 1 TABLET BY MOUTH TWICE A DAY 60 tablet 11   levothyroxine (SYNTHROID) 125 MCG tablet Take 1 tablet (125 mcg total) by mouth daily. 30 tablet 11   lisinopril (ZESTRIL) 40 MG tablet TAKE 1 TABLET BY MOUTH EVERY DAY 90 tablet 1   montelukast (SINGULAIR) 10 MG tablet TAKE 1 TABLET BY MOUTH EVERYDAY AT BEDTIME (Patient taking differently: Take 10 mg by mouth at bedtime.) 90 tablet 1   ondansetron (ZOFRAN ODT) 8 MG disintegrating tablet Take 1 tablet (8 mg total) by mouth every 8 (eight) hours as needed. (Patient not taking: Reported on 07/08/2022) 4 tablet 0   pantoprazole (PROTONIX) 40 MG tablet TAKE 1  TABLET BY MOUTH EVERY DAY (Patient not taking: Reported on 05/09/2022) 90 tablet 3   rivaroxaban (XARELTO) 20 MG TABS tablet Take 1 tablet (20 mg total) by mouth daily with supper. 30 tablet 0   sertraline (ZOLOFT) 50 MG tablet Take 1 tablet (50 mg total) by mouth daily. 30 tablet 1   simvastatin (ZOCOR) 40 MG tablet TAKE 1 TABLET BY MOUTH EVERY DAY 90 tablet 1   tamsulosin (FLOMAX) 0.4 MG CAPS capsule TAKE 2 CAPSULES BY MOUTH EVERY DAY 180 capsule 0   Vitamin D, Ergocalciferol, (DRISDOL) 1.25 MG (50000 UNIT) CAPS capsule Take 1 capsule (50,000 Units total) by mouth every 7 (seven) days. (Patient not taking: Reported on 05/15/2022) 12 capsule 0   No current facility-administered medications for this visit.     Past Medical History:  Diagnosis Date   Allergy    Anxiety    Atrial fibrillation ([facility])    Bell palsy    Depression    Diastolic heart failure    Hypercholesterolemia    Hypertension    Pneumonia    Thyroid disease     ROS:   All systems reviewed and negative except as noted in the HPI.   Past Surgical History:  Procedure Laterality Date   CHOLECYSTECTOMY     HERNIA REPAIR  KNEE SURGERY     VASECTOMY       Family History  Problem Relation Age of Onset   Heart disease Mother    Heart attack Mother    Other Mother        thyroid problems   Kidney cancer Mother    Cirrhosis Father    Other Father        thyroid problems   Alcohol abuse Father    Hypertension Father    Mental illness Brother    Parkinsonism Brother      Social History   Socioeconomic History   Marital status: Married    Spouse name: Not on file   Number of children: 1   Years of education: Not on file   Highest education level: Not on file  Occupational History   Not on file  Tobacco Use   Smoking status: Every Day    Packs/day: 0.50    Years: 40.00    Total pack years: 20.00    Types: Cigarettes    Start date: 09/14/1978   Smokeless tobacco: Never  Vaping Use   Vaping Use:  Never used  Substance and Sexual Activity   Alcohol use: Yes    Alcohol/week: 0.0 standard drinks of alcohol    Comment: rarely   Drug use: No   Sexual activity: Never    Birth control/protection: None  Other Topics Concern   Not on file  Social History Narrative   Marital status: married x 30+ years      Children:  2 children;(38,37); 3+3 grandchild      Lives:  With wife, son      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      Tobacco: 1 ppd x 30 years      Alcohol: rare     Regular exercise: walking daily   Caffeine use: daily      Social Determinants of Health   Financial Resource Strain: Not on file  Food Insecurity: Not on file  Transportation Needs: Not on file  Physical Activity: Not on file  Stress: Not on file  Social Connections: Not on file  Intimate Partner Violence: Not on file     BP 128/86   Pulse 99   Ht '5\' 9"'$  (1.753 m)   Wt 195 lb (88.5 kg)   SpO2 96%   BMI 28.80 kg/m   Physical Exam:  Well appearing NAD HEENT: Unremarkable Neck:  No JVD, no thyromegally Lymphatics:  No adenopathy Back:  No CVA tenderness Lungs:  Clear HEART:  Regular rate rhythm, no murmurs, no rubs, no clicks Abd:  soft, positive bowel sounds, no organomegally, no rebound, no guarding Ext:  2 plus pulses, no edema, no cyanosis, no clubbing Skin:  No rashes no nodules Neuro:  CN II through XII intact, motor grossly intact  EKG  DEVICE  Normal device function.  See PaceArt for details.   Assess/Plan: 1. Persistent atrial fib - he had been maintained NSR very nicely but has gone back to atrial fib. I discussed the treatment options with the patient and he would like to pursue a strategy of rate control for now. We will uptitrate his beta blocker and stop the amlodipine.  2. PVC's - he is asymptomatic and thinks that his PVCs are controlled.  3. HTN - his bp is elevated today but usually better at home. 4. Dyslipidemia - he will continue simvastatin.    Carleene Overlie ,MD

## 2022-08-14 NOTE — Patient Instructions (Addendum)
Medication Instructions:  Your physician has recommended you make the following change in your medication: STOP TAKING Flecainide TODAY. Coreg Increase: 25 Mg twice daily;        Xarelto script refilled and sent to pharmacy.     STOP Taking Flecainide Today, 08/14/2022.    Coreg increase:  You will-   Take 1 tablet (25 mg total) by mouth 2 (two) times daily.   Lab Work: None ordered.  If you have labs (blood work) drawn today and your tests are completely normal, you will receive your results only by: [place] (if you have MyChart) OR A paper copy in the mail If you have any lab test that is abnormal or we need to change your treatment, we will call you to review the results.  Testing/Procedures: None ordered.  Follow-Up: At [facility], you and your health needs are our priority.  As part of our continuing mission to provide you with exceptional heart care, we have created designated Provider Care Teams.  These Care Teams include your primary Cardiologist (physician) and Advanced Practice Providers (APPs -  Physician Assistants and Nurse Practitioners) who all work together to provide you with the care you need, when you need it.  We recommend signing up for the patient portal called "MyChart".  Sign up information is provided on this After Visit Summary.  MyChart is used to connect with patients for Virtual Visits (Telemedicine).  Patients are able to view lab/test results, encounter notes, upcoming appointments, etc.  Non-urgent messages can be sent to your provider as well.   To learn more about what you can do with MyChart, go to [email or web address].    Your next appointment:   Please schedule follow up appointment with Dr. Cristopher Peru in 3 to 4 months.    The format for your next appointment:   In Person  Provider:   Cristopher Peru, MD{or one of the following Advanced Practice Providers on your designated Care Team:   Tommye Standard, [place] Legrand Como "Jonni Sanger" Chalmers Cater,  [place]   Important Information About Sugar      Carvedilol Tablets What is this medication? CARVEDILOL (KAR ve dil ol) treats high blood pressure and heart failure. It may also be used to prevent further damage after a heart attack. It works by lowering your blood pressure and heart rate, making it easier for your heart to pump blood to the rest of your body. It belongs to a group of medications called beta blockers. This medicine may be used for other purposes; ask your health care provider or pharmacist if you have questions. COMMON BRAND NAME(S): Coreg What should I tell my care team before I take this medication? They need to know if you have any of these conditions: Circulation problems Diabetes History of heart attack or heart disease Liver disease Lung or breathing disease, such as asthma Pheochromocytoma Slow or irregular heartbeat Thyroid disease An unusual or allergic reaction to carvedilol, other medications, foods, dyes, or preservatives Pregnant or trying to get pregnant Breastfeeding How should I use this medication? Take this medication by mouth. Take it as directed on the prescription label at the same time every day. Take it with food. Keep taking it unless your care team tells you to stop. Talk to your care team about the use of this medication in children. Special care may be needed. Overdosage: If you think you have taken too much of this medicine contact a poison control center or emergency room at once. NOTE: This medicine is  only for you. Do not share this medicine with others. What if I miss a dose? If you miss a dose, take it as soon as you can. If it is almost time for your next dose, take only that dose. Do not take double or extra doses. What may interact with this medication? This medication may interact with the following: Certain medications for blood pressure, heart disease, irregular heartbeat Certain medications for depression, such as fluoxetine  or paroxetine Certain medications for diabetes, such as glipizide or glyburide Cimetidine Clonidine Cyclosporine Digoxin MAOIs, such as Carbex, Eldepryl, Marplan, Nardil, and Parnate Reserpine Rifampin This list may not describe all possible interactions. Give your health care provider a list of all the medicines, herbs, non-prescription drugs, or dietary supplements you use. Also tell them if you smoke, drink alcohol, or use illegal drugs. Some items may interact with your medicine. What should I watch for while using this medication? Visit your care team for regular checks on your progress. Check your blood pressure as directed. Know what your blood pressure should be and when to contact your care team. Do not treat yourself for coughs, colds, or pain while you are using this medication without asking your care team for advice. Some medications may increase your blood pressure. This medication may affect your coordination, reaction time, or judgment. Do not drive or operate machinery until you know how this medication affects you. Sit up or stand slowly to reduce the risk of dizzy or fainting spells. Drinking alcohol with this medication can increase the risk of these side effects. This medication may increase blood sugar. Ask your care team if changes in diet or medications are needed if you have diabetes. If you are going to need surgery or another procedure, tell your care team that you are using this medication. What side effects may I notice from receiving this medication? Side effects that you should report to your care team as soon as possible: Allergic reactions--skin rash, itching, hives, swelling of the face, lips, tongue, or throat Heart failure--shortness of breath, swelling of the ankles, feet, or hands, sudden weight gain, unusual weakness or fatigue Low blood pressure--dizziness, feeling faint or lightheaded, blurry vision Raynaud's--cool, numb, or painful fingers or toes that  may change color from pale, to blue, to red Slow heartbeat--dizziness, feeling faint or lightheaded, confusion, trouble breathing, unusual weakness or fatigue Worsening mood, feelings of depression Side effects that usually do not require medical attention (report to your care team if they continue or are bothersome): Change in sex drive or performance Diarrhea Dizziness Fatigue Headache This list may not describe all possible side effects. Call your doctor for medical advice about side effects. You may report side effects to FDA at 1-800-FDA-1088. Where should I keep my medication? Keep out of the reach of children and pets. Store at room temperature between 20 and 25 degrees C (68 and 77 degrees F). Protect from moisture. Keep the container tightly closed. Throw away any unused medication after the expiration date. NOTE: This sheet is a summary. It may not cover all possible information. If you have questions about this medicine, talk to your doctor, pharmacist, or health care provider.  2023 Elsevier/Gold Standard (2020-10-25 00:00:00)

## 2022-08-25 ENCOUNTER — Other Ambulatory Visit: Payer: Self-pay

## 2022-08-25 DIAGNOSIS — E890 Postprocedural hypothyroidism: Secondary | ICD-10-CM

## 2022-08-25 MED ORDER — LEVOTHYROXINE SODIUM 200 MCG PO TABS: 200.0000 ug | ORAL_TABLET | Freq: Every day | ORAL | 3 refills | Status: DC

## 2022-08-31 ENCOUNTER — Other Ambulatory Visit (INDEPENDENT_AMBULATORY_CARE_PROVIDER_SITE_OTHER): Payer: 59

## 2022-08-31 DIAGNOSIS — I10 Essential (primary) hypertension: Secondary | ICD-10-CM | POA: Diagnosis not present

## 2022-08-31 DIAGNOSIS — E890 Postprocedural hypothyroidism: Secondary | ICD-10-CM | POA: Diagnosis not present

## 2022-08-31 LAB — BASIC METABOLIC PANEL
BUN: 13 mg/dL (ref 6–23)
CO2: 29 mEq/L (ref 19–32)
Calcium: 9.6 mg/dL (ref 8.4–10.5)
Chloride: 100 mEq/L (ref 96–112)
Creatinine, Ser: 0.95 mg/dL (ref 0.40–1.50)
GFR: 85.06 mL/min (ref >60.00)
Glucose, Bld: 110 mg/dL — ABNORMAL HIGH (ref 70–99)
Potassium: 4.2 mEq/L (ref 3.5–5.1)
Sodium: 138 mEq/L (ref 135–145)

## 2022-08-31 LAB — TSH: TSH: 0.26 u[IU]/mL — ABNORMAL LOW (ref 0.35–5.50)

## 2022-08-31 LAB — T4, FREE: Free T4: 1.39 ng/dL (ref 0.60–1.60)

## 2022-09-02 ENCOUNTER — Encounter: Payer: Self-pay | Admitting: Endocrinology

## 2022-09-02 ENCOUNTER — Ambulatory Visit: Payer: 59 | Admitting: Endocrinology

## 2022-09-02 ENCOUNTER — Other Ambulatory Visit: Payer: 59

## 2022-09-02 VITALS — BP 130/84 | HR 92 | Ht 69.0 in | Wt 193.0 lb

## 2022-09-02 DIAGNOSIS — I10 Essential (primary) hypertension: Secondary | ICD-10-CM

## 2022-09-02 DIAGNOSIS — E890 Postprocedural hypothyroidism: Secondary | ICD-10-CM

## 2022-09-02 NOTE — Progress Notes (Signed)
Patient ID: Jesse Europe [name], male   DOB: 07/09/1961, 61 y.o.   MRN: [id number]   Reason for Appointment: Follow-up    History of Present Illness:   The hypothyroidism was first diagnosed in 11/13. Previously had I-131 treatment for Graves' disease He was initially started with 88 mcg but his dose needed to be increased progressively  He has been treated with brand name SYNTHROID, the dose has been adjusted previously based on his labs  He is taking 200 mcg brand-name Synthroid However his dose has been markedly variable between 125 and 175 Not clear why he has gone up on the dose recently even though his TSH in the hospital was low On his last visit he was recommended 150 mcg   Weight is about the same Still has tendency to feeling tired because of stress  He thinks he is getting brand-name Synthroid now but the prescription was lowered during his previous admission and given generic 125 mcg at discharge In October  was supposedly taking 175 mcg Synthroid and he was told to take an extra pill once a week and he is doing so Now getting 200 mcg  He takes this before breakfast but usually with Gatorade Been regular with the regimen  TSH has come down but now is slightly low at 0.26   Wt Readings from Last 3 Encounters:  09/02/22 193 lb (87.5 kg)  08/14/22 195 lb (88.5 kg)  07/08/22 194 lb 11.2 oz (88.3 kg)    LABS:   Lab Results  Component Value Date   TSH 0.26 (L) 08/31/2022   TSH 8.41 (H) 07/06/2022   TSH 0.338 (L) 05/09/2022   FREET4 1.39 08/31/2022   FREET4 0.93 07/06/2022   FREET4 1.56 (H) 05/09/2022      HYPERTENSION:  He has had long-standing hypertension which is  managed with lisinopril 40, Catapres 0.1 mg patch and Coreg 1-1/2 tablets twice a day He was told to stop hydrochlorothiazide which was causing hyponatremia  He was evaluated for secondary hypertension and reports of his aldosterone/renin indicate normal levels Also was supposed to  get the renal artery ultrasound but this was not done  He has checked his blood pressure regularly at home with the [employer] monitor on his arm  Home his blood pressure is usually normal around 118/75-80 recently  May be higher today because of being stressed    Blood pressure readings recently:  BP Readings from Last 3 Encounters:  09/02/22 130/84  08/14/22 128/86  07/08/22 (!) 138/90       Allergies as of 09/02/2022       Reactions   Sulfa Drugs Cross Reactors Hives   Codeine Other (See Comments)   Made him very jittery    Hydrochlorothiazide Other (See Comments)   Hyponatremia        Medication List        Accurate as of September 02, 2022  8:48 PM. If you have any questions, ask your nurse or doctor.          albuterol (2.5 MG/3ML) 0.083% nebulizer solution Commonly known as: PROVENTIL Take 3 mLs (2.5 mg total) by nebulization every 6 (six) hours as needed for wheezing or shortness of breath. What changed: Another medication with the same name was changed. Make sure you understand how and when to take each.   albuterol 108 (90 Base) MCG/ACT inhaler Commonly known as: VENTOLIN HFA INHALE 2 PUFFS INTO THE LUNGS EVERY 6 HOURS AS NEEDED FOR WHEEZING/SHORTNESS OF  BREATH What changed: See the new instructions.   aspirin 81 MG tablet Take 81 mg by mouth every other day.   azelastine 0.1 % nasal spray Commonly known as: ASTELIN Place 1 spray into both nostrils 2 (two) times daily. Use in each nostril as directed   carvedilol 25 MG tablet Commonly known as: COREG Take 1 tablet (25 mg total) by mouth 2 (two) times daily.   cloNIDine 0.1 mg/24hr patch Commonly known as: CATAPRES - Dosed in mg/24 hr APPLY 1 PATCH ONCE A WEEK   levothyroxine 200 MCG tablet Commonly known as: SYNTHROID Take 1 tablet (200 mcg total) by mouth daily.   lisinopril 40 MG tablet Commonly known as: ZESTRIL TAKE 1 TABLET BY MOUTH EVERY DAY   montelukast 10 MG tablet Commonly known  as: SINGULAIR TAKE 1 TABLET BY MOUTH EVERYDAY AT BEDTIME What changed: See the new instructions.   ondansetron 8 MG disintegrating tablet Commonly known as: Zofran ODT Take 1 tablet (8 mg total) by mouth every 8 (eight) hours as needed.   pantoprazole 40 MG tablet Commonly known as: PROTONIX TAKE 1 TABLET BY MOUTH EVERY DAY   rivaroxaban 20 MG Tabs tablet Commonly known as: XARELTO Take 1 tablet (20 mg total) by mouth daily with supper.   sertraline 50 MG tablet Commonly known as: ZOLOFT Take 1 tablet (50 mg total) by mouth daily.   simvastatin 40 MG tablet Commonly known as: ZOCOR TAKE 1 TABLET BY MOUTH EVERY DAY   tamsulosin 0.4 MG Caps capsule Commonly known as: FLOMAX TAKE 2 CAPSULES BY MOUTH EVERY DAY   Vitamin D (Ergocalciferol) 1.25 MG (50000 UNIT) Caps capsule Commonly known as: DRISDOL Take 1 capsule (50,000 Units total) by mouth every 7 (seven) days.   VITAMIN D3 PO Take 1 tablet by mouth daily.        Past Medical History:  Diagnosis Date   Allergy    Anxiety    Atrial fibrillation ([place])    Bell palsy    Depression    Diastolic heart failure    Hypercholesterolemia    Hypertension    Pneumonia    Thyroid disease     Past Surgical History:  Procedure Laterality Date   CHOLECYSTECTOMY     HERNIA REPAIR     KNEE SURGERY     VASECTOMY      Family History  Problem Relation Age of Onset   Heart disease Mother    Heart attack Mother    Other Mother        thyroid problems   Kidney cancer Mother    Cirrhosis Father    Other Father        thyroid problems   Alcohol abuse Father    Hypertension Father    Mental illness Brother    Parkinsonism Brother     Social History:  reports that he has been smoking cigarettes. He started smoking about 43 years ago. He has a 20.00 pack-year smoking history. He has never used smokeless tobacco. He reports current alcohol use. He reports that he does not use drugs.  Allergies:  Allergies  Allergen  Reactions   Sulfa Drugs Cross Reactors Hives   Codeine Other (See Comments)    Made him very jittery    Hydrochlorothiazide Other (See Comments)    Hyponatremia    REVIEW of systems:   HYPONATREMIA: His sodium had been better with stopping HCTZ  He was told to cut back on drinks like [facility]  He has likely  reduced his overall fluid intake as sodium is now consistently normal Probably has had mild SIADH  CT scan of his chest did not show any tumor  Sodium back to normal now  Lab Results  Component Value Date   NA 138 08/31/2022   K 4.2 08/31/2022   CL 100 08/31/2022   CO2 29 08/31/2022    Vitamin D deficiency: His recent level in April was about 19 done by PCP    Examination:   BP 130/84   Pulse 92   Ht '5\' 9"'$  (1.753 m)   Wt 193 lb (87.5 kg)   SpO2 94%   BMI 28.50 kg/m      Assessments    HYPERTENSION:   His blood pressure today is slightly high diastolic but as usual better at home To continue same regimen and monitor at home  HYPOTHYROIDISM:  secondary to I-131 treatment  His TSH is slightly low with 200 mcg Synthroid and he can now take 6-1/2 tablets a week, taking half on Sundays  Again advised him not to exceed fluid intake to prevent hyponatremia   Patient Instructions  Take 200 Synthroid with only 1/2 on Sundays  Limit fluid intake  Flu vaccine given  [name] 09/02/2022, 8:48 PM

## 2022-09-02 NOTE — Patient Instructions (Addendum)
Take 200 Synthroid with only 1/2 on Sundays  Limit fluid intake

## 2022-09-08 ENCOUNTER — Other Ambulatory Visit: Payer: 59

## 2022-09-11 ENCOUNTER — Other Ambulatory Visit: Payer: Self-pay

## 2022-09-11 ENCOUNTER — Ambulatory Visit: Payer: 59 | Admitting: Endocrinology

## 2022-09-11 ENCOUNTER — Encounter (HOSPITAL_COMMUNITY): Payer: Self-pay

## 2022-09-11 ENCOUNTER — Encounter: Payer: Self-pay | Admitting: Internal Medicine

## 2022-09-11 ENCOUNTER — Emergency Department (HOSPITAL_COMMUNITY): Payer: 59

## 2022-09-11 ENCOUNTER — Inpatient Hospital Stay (HOSPITAL_COMMUNITY)
Admission: EM | Admit: 2022-09-11 | Discharge: 2022-09-15 | DRG: 308 | Disposition: A | Discharge status: Home / Self Care | Payer: 59 | Source: Ambulatory Visit | Attending: Family Medicine | Admitting: Family Medicine

## 2022-09-11 DIAGNOSIS — I480 Paroxysmal atrial fibrillation: Secondary | ICD-10-CM | POA: Diagnosis present

## 2022-09-11 DIAGNOSIS — E785 Hyperlipidemia, unspecified: Secondary | ICD-10-CM | POA: Diagnosis not present

## 2022-09-11 DIAGNOSIS — J129 Viral pneumonia, unspecified: Secondary | ICD-10-CM | POA: Diagnosis present

## 2022-09-11 DIAGNOSIS — I1A0 Resistant hypertension: Secondary | ICD-10-CM | POA: Diagnosis present

## 2022-09-11 DIAGNOSIS — Z1152 Encounter for screening for COVID-19: Secondary | ICD-10-CM

## 2022-09-11 DIAGNOSIS — I5032 Chronic diastolic (congestive) heart failure: Secondary | ICD-10-CM | POA: Diagnosis present

## 2022-09-11 DIAGNOSIS — J069 Acute upper respiratory infection, unspecified: Secondary | ICD-10-CM | POA: Diagnosis present

## 2022-09-11 DIAGNOSIS — Z7982 Long term (current) use of aspirin: Secondary | ICD-10-CM

## 2022-09-11 DIAGNOSIS — Z7901 Long term (current) use of anticoagulants: Secondary | ICD-10-CM

## 2022-09-11 DIAGNOSIS — I10 Essential (primary) hypertension: Secondary | ICD-10-CM | POA: Diagnosis not present

## 2022-09-11 DIAGNOSIS — Z8249 Family history of ischemic heart disease and other diseases of the circulatory system: Secondary | ICD-10-CM

## 2022-09-11 DIAGNOSIS — F17210 Nicotine dependence, cigarettes, uncomplicated: Secondary | ICD-10-CM | POA: Diagnosis present

## 2022-09-11 DIAGNOSIS — Z8051 Family history of malignant neoplasm of kidney: Secondary | ICD-10-CM

## 2022-09-11 DIAGNOSIS — E7800 Pure hypercholesterolemia, unspecified: Secondary | ICD-10-CM | POA: Diagnosis present

## 2022-09-11 DIAGNOSIS — I4891 Unspecified atrial fibrillation: Secondary | ICD-10-CM | POA: Diagnosis present

## 2022-09-11 DIAGNOSIS — I4819 Other persistent atrial fibrillation: Principal | ICD-10-CM | POA: Diagnosis present

## 2022-09-11 DIAGNOSIS — E890 Postprocedural hypothyroidism: Secondary | ICD-10-CM | POA: Diagnosis present

## 2022-09-11 DIAGNOSIS — F419 Anxiety disorder, unspecified: Secondary | ICD-10-CM | POA: Diagnosis present

## 2022-09-11 DIAGNOSIS — Z882 Allergy status to sulfonamides status: Secondary | ICD-10-CM

## 2022-09-11 DIAGNOSIS — B9789 Other viral agents as the cause of diseases classified elsewhere: Secondary | ICD-10-CM | POA: Diagnosis present

## 2022-09-11 DIAGNOSIS — I493 Ventricular premature depolarization: Secondary | ICD-10-CM | POA: Diagnosis present

## 2022-09-11 DIAGNOSIS — E039 Hypothyroidism, unspecified: Secondary | ICD-10-CM | POA: Diagnosis present

## 2022-09-11 DIAGNOSIS — J209 Acute bronchitis, unspecified: Secondary | ICD-10-CM

## 2022-09-11 DIAGNOSIS — Z818 Family history of other mental and behavioral disorders: Secondary | ICD-10-CM

## 2022-09-11 DIAGNOSIS — D6869 Other thrombophilia: Secondary | ICD-10-CM | POA: Diagnosis not present

## 2022-09-11 DIAGNOSIS — Z79899 Other long term (current) drug therapy: Secondary | ICD-10-CM

## 2022-09-11 DIAGNOSIS — I110 Hypertensive heart disease with heart failure: Secondary | ICD-10-CM | POA: Diagnosis present

## 2022-09-11 DIAGNOSIS — Z79890 Hormone replacement therapy (postmenopausal): Secondary | ICD-10-CM

## 2022-09-11 DIAGNOSIS — Z811 Family history of alcohol abuse and dependence: Secondary | ICD-10-CM

## 2022-09-11 LAB — BASIC METABOLIC PANEL
Anion gap: 8 (ref 5–15)
BUN: 12 mg/dL (ref 8–23)
CO2: 28 mmol/L (ref 22–32)
Calcium: 8.8 mg/dL — ABNORMAL LOW (ref 8.9–10.3)
Chloride: 101 mmol/L (ref 98–111)
Creatinine, Ser: 0.95 mg/dL (ref 0.61–1.24)
GFR, Estimated: >60 mL/min (ref >60)
Glucose, Bld: 99 mg/dL (ref 70–99)
Potassium: 4.3 mmol/L (ref 3.5–5.1)
Sodium: 137 mmol/L (ref 135–145)

## 2022-09-11 LAB — CBC
HCT: 44.7 % (ref 39.0–52.0)
Hemoglobin: 14.7 g/dL (ref 13.0–17.0)
MCH: 30.8 pg (ref 26.0–34.0)
MCHC: 32.9 g/dL (ref 30.0–36.0)
MCV: 93.7 fL (ref 80.0–100.0)
Platelets: 188 10*3/uL (ref 150–400)
RBC: 4.77 MIL/uL (ref 4.22–5.81)
RDW: 13 % (ref 11.5–15.5)
WBC: 10.4 10*3/uL (ref 4.0–10.5)
nRBC: 0 % (ref 0.0–0.2)

## 2022-09-11 LAB — TROPONIN I (HIGH SENSITIVITY)
Troponin I (High Sensitivity): 5 ng/L (ref <18)
Troponin I (High Sensitivity): 5 ng/L (ref <18)

## 2022-09-11 LAB — RESP PANEL BY RT-PCR (RSV, FLU A&B, COVID)  RVPGX2
Influenza A by PCR: NEGATIVE
Influenza B by PCR: NEGATIVE
Resp Syncytial Virus by PCR: NEGATIVE
SARS Coronavirus 2 by RT PCR: NEGATIVE

## 2022-09-11 LAB — BRAIN NATRIURETIC PEPTIDE: B Natriuretic Peptide: 225.9 pg/mL — ABNORMAL HIGH (ref 0.0–100.0)

## 2022-09-11 MED ORDER — PANTOPRAZOLE SODIUM 40 MG PO TBEC
40.0000 mg | DELAYED_RELEASE_TABLET | Freq: Every day | ORAL | Status: DC
  Administered 2022-09-12 – 2022-09-15 (×4): 40 mg via ORAL
  Filled 2022-09-11 (×4): qty 1

## 2022-09-11 MED ORDER — RIVAROXABAN 20 MG PO TABS
20.0000 mg | ORAL_TABLET | Freq: Every day | ORAL | Status: DC
  Administered 2022-09-11 – 2022-09-14 (×4): 20 mg via ORAL
  Filled 2022-09-11: qty 1
  Filled 2022-09-11: qty 2
  Filled 2022-09-11 (×2): qty 1

## 2022-09-11 MED ORDER — GUAIFENESIN-DM 100-10 MG/5ML PO SYRP
5.0000 mL | ORAL_SOLUTION | ORAL | Status: DC | PRN
  Administered 2022-09-12 – 2022-09-14 (×5): 5 mL via ORAL
  Filled 2022-09-11 (×5): qty 5

## 2022-09-11 MED ORDER — METOPROLOL TARTRATE 5 MG/5ML IV SOLN
5.0000 mg | Freq: Once | INTRAVENOUS | Status: AC
  Administered 2022-09-11: 5 mg via INTRAVENOUS
  Filled 2022-09-11: qty 5

## 2022-09-11 MED ORDER — ACETAMINOPHEN 325 MG PO TABS
650.0000 mg | ORAL_TABLET | ORAL | Status: DC | PRN
  Administered 2022-09-12 – 2022-09-14 (×3): 650 mg via ORAL
  Filled 2022-09-11 (×3): qty 2

## 2022-09-11 MED ORDER — SERTRALINE HCL 50 MG PO TABS
50.0000 mg | ORAL_TABLET | Freq: Every day | ORAL | Status: DC
  Administered 2022-09-12 – 2022-09-15 (×4): 50 mg via ORAL
  Filled 2022-09-11 (×4): qty 1

## 2022-09-11 MED ORDER — AMIODARONE HCL IN DEXTROSE 360-4.14 MG/200ML-% IV SOLN
60.0000 mg/h | INTRAVENOUS | Status: AC
  Administered 2022-09-11 (×2): 60 mg/h via INTRAVENOUS
  Filled 2022-09-11: qty 200

## 2022-09-11 MED ORDER — LEVOTHYROXINE SODIUM 100 MCG PO TABS
200.0000 ug | ORAL_TABLET | ORAL | Status: DC
  Administered 2022-09-12 – 2022-09-15 (×3): 200 ug via ORAL
  Filled 2022-09-11 (×3): qty 2

## 2022-09-11 MED ORDER — ONDANSETRON HCL 4 MG/2ML IJ SOLN
4.0000 mg | Freq: Four times a day (QID) | INTRAMUSCULAR | Status: DC | PRN
  Administered 2022-09-12 – 2022-09-14 (×2): 4 mg via INTRAVENOUS
  Filled 2022-09-11 (×2): qty 2

## 2022-09-11 MED ORDER — LEVOTHYROXINE SODIUM 100 MCG PO TABS: 100.0000 ug | ORAL_TABLET | Freq: Every day | ORAL | Status: DC

## 2022-09-11 MED ORDER — LEVOTHYROXINE SODIUM 100 MCG PO TABS
100.0000 ug | ORAL_TABLET | ORAL | Status: DC
  Administered 2022-09-13: 100 ug via ORAL
  Filled 2022-09-11 (×2): qty 1

## 2022-09-11 MED ORDER — PHENOL 1.4 % MT LIQD: 1.0000 | OROMUCOSAL | Status: DC | PRN

## 2022-09-11 MED ORDER — METOPROLOL TARTRATE 25 MG PO TABS
25.0000 mg | ORAL_TABLET | Freq: Three times a day (TID) | ORAL | Status: DC
  Administered 2022-09-11 – 2022-09-12 (×2): 25 mg via ORAL
  Filled 2022-09-11 (×2): qty 1

## 2022-09-11 MED ORDER — AMIODARONE HCL IN DEXTROSE 360-4.14 MG/200ML-% IV SOLN
30.0000 mg/h | INTRAVENOUS | Status: DC
  Administered 2022-09-12 – 2022-09-15 (×7): 30 mg/h via INTRAVENOUS
  Filled 2022-09-11 (×7): qty 200

## 2022-09-11 MED ORDER — CARVEDILOL 12.5 MG PO TABS: 25.0000 mg | ORAL_TABLET | Freq: Two times a day (BID) | ORAL | Status: DC

## 2022-09-11 MED ORDER — AMIODARONE LOAD VIA INFUSION
150.0000 mg | Freq: Once | INTRAVENOUS | Status: AC
  Administered 2022-09-11: 150 mg via INTRAVENOUS
  Filled 2022-09-11: qty 83.34

## 2022-09-11 MED ORDER — SIMVASTATIN 20 MG PO TABS
40.0000 mg | ORAL_TABLET | Freq: Every day | ORAL | Status: DC
  Administered 2022-09-12: 40 mg via ORAL
  Filled 2022-09-11: qty 2

## 2022-09-11 MED ORDER — CLONIDINE HCL 0.1 MG/24HR TD PTWK: 0.1000 mg | MEDICATED_PATCH | TRANSDERMAL | Status: DC

## 2022-09-11 MED ORDER — LEVALBUTEROL HCL 0.63 MG/3ML IN NEBU: 0.6300 mg | INHALATION_SOLUTION | Freq: Four times a day (QID) | RESPIRATORY_TRACT | Status: DC | PRN

## 2022-09-11 NOTE — Assessment & Plan Note (Signed)
Per Dr. Dwyane Dee note from 9 days ago: Synthroid '200mg'$  daily on M-Sat, only '100mg'$  on Sundays

## 2022-09-11 NOTE — Consult Note (Addendum)
Cardiology Consultation   Patient ID: Jesse Suarez [name] MRN: [id number]; DOB: 25-Jan-1961  Admit date: 09/11/2022 Date of Consult: 09/11/2022  PCP:  Kathyrn Lass   [facility] Cardiologist:  Cristopher Peru, MD        Patient Profile:   Jesse Suarez [name] is a 61 y.o. male with a hx of remote grave's disease HTN, Persistent AF, PVCs  who is being seen 09/11/2022 for the evaluation of atrial fib with RVR at the request of Dr. Melina Copa.  History of Present Illness:   Jesse Suarez with hx as above and had maintained NSR but went back into atrial fib and Dr. Lovena Le and pt deiced on rate control.  His BB was increased and amlodipine was stopped.  PVCs have been controlled, BP controlled and on statin for HLD.   Echo from 3.2012 with EF 60% in rapid atrial fib with LA mod dilated and RV mildly dilated.  No known CAD and no stress test or cath found.   [facility] and found to be in a fib with RVR,  rate 160.  No chest pain or SOB.    He was given 20 mg dilt by EMS and 500 CC LR.  HR improved BP stable 126/83.   Na 137 K+ 4.3 BUN 12 Cr 0.95 hs troponin 5 WBC 10.4 Hgb 14.7 plts 188   (TSH on the 18th of dec 0.26 and free T4 1.39) 2V CXR IMPRESSION: 1. Mild increased interstitial markings in the chest may represent mild edema or atypical infection. 2. Elevated RIGHT hemidiaphragm with juxta diaphragmatic atelectasis.  EKG:  The EKG was personally reviewed and demonstrates:  atrial fib 107 and no acute ST changes  similar to EKG 08/14/22 Telemetry:  Telemetry was personally reviewed and demonstrates:  atrial fib   Past Medical History:  Diagnosis Date   Allergy    Anxiety    Atrial fibrillation (HCC)    Bell palsy    Depression    Diastolic heart failure    Hypercholesterolemia    Hypertension    Pneumonia    Thyroid disease     Past Surgical History:  Procedure Laterality Date   CHOLECYSTECTOMY      HERNIA REPAIR     KNEE SURGERY     VASECTOMY       Home Medications:  Prior to Admission medications   Medication Sig Start Date End Date Taking? Authorizing Provider  albuterol (PROVENTIL) (2.5 MG/3ML) 0.083% nebulizer solution Take 3 mLs (2.5 mg total) by nebulization every 6 (six) hours as needed for wheezing or shortness of breath. 08/17/18   Brunetta Jeans, PA-C  albuterol (VENTOLIN HFA) 108 (90 Base) MCG/ACT inhaler INHALE 2 PUFFS INTO THE LUNGS EVERY 6 HOURS AS NEEDED FOR WHEEZING/SHORTNESS OF BREATH Patient taking differently: Inhale 2 puffs into the lungs every 6 (six) hours as needed for shortness of breath. 04/24/22   Wendie Agreste, MD  aspirin 81 MG tablet Take 81 mg by mouth every other day.    [provider]  azelastine (ASTELIN) 0.1 % nasal spray Place 1 spray into both nostrils 2 (two) times daily. Use in each nostril as directed 06/26/21   Maximiano Coss, NP  carvedilol (COREG) 25 MG tablet Take 1 tablet (25 mg total) by mouth 2 (two) times daily. 08/14/22   Evans Lance, MD  Cholecalciferol (VITAMIN D3 PO) Take 1 tablet by mouth daily.  [provider]  cloNIDine (CATAPRES - DOSED IN MG/24 HR) 0.1 mg/24hr patch APPLY 1 PATCH ONCE A WEEK 07/24/22   [name], MD  levothyroxine (SYNTHROID) 200 MCG tablet Take 1 tablet (200 mcg total) by mouth daily. 08/25/22   [name], MD  lisinopril (ZESTRIL) 40 MG tablet TAKE 1 TABLET BY MOUTH EVERY DAY 05/13/22   Evans Lance, MD  montelukast (SINGULAIR) 10 MG tablet TAKE 1 TABLET BY MOUTH EVERYDAY AT BEDTIME Patient taking differently: Take 10 mg by mouth at bedtime. 04/01/22   Maximiano Coss, NP  ondansetron (ZOFRAN ODT) 8 MG disintegrating tablet Take 1 tablet (8 mg total) by mouth every 8 (eight) hours as needed. 01/06/22   Maximiano Coss, NP  pantoprazole (PROTONIX) 40 MG tablet TAKE 1 TABLET BY MOUTH EVERY DAY 11/06/21   Maximiano Coss, NP  rivaroxaban (XARELTO) 20 MG TABS tablet Take 1 tablet (20 mg  total) by mouth daily with supper. 08/14/22   Evans Lance, MD  sertraline (ZOLOFT) 50 MG tablet Take 1 tablet (50 mg total) by mouth daily. 07/08/22   [name], MD  simvastatin (ZOCOR) 40 MG tablet TAKE 1 TABLET BY MOUTH EVERY DAY 01/05/22   Maximiano Coss, NP  tamsulosin (FLOMAX) 0.4 MG CAPS capsule TAKE 2 CAPSULES BY MOUTH EVERY DAY 04/24/22   Wendie Agreste, MD  Vitamin D, Ergocalciferol, (DRISDOL) 1.25 MG (50000 UNIT) CAPS capsule Take 1 capsule (50,000 Units total) by mouth every 7 (seven) days. 01/06/22   Maximiano Coss, NP    Inpatient Medications: Scheduled Meds:  amiodarone  150 mg Intravenous Once   Continuous Infusions:  amiodarone     Followed by   Derrill Memo ON 09/12/2022] amiodarone     PRN Meds:   Allergies:    Allergies  Allergen Reactions   Sulfa Drugs Cross Reactors Hives   Codeine Other (See Comments)    Made him very jittery    Hydrochlorothiazide Other (See Comments)    Hyponatremia    Social History:   Social History   Socioeconomic History   Marital status: Married    Spouse name: Not on file   Number of children: 1   Years of education: Not on file   Highest education level: Not on file  Occupational History   Not on file  Tobacco Use   Smoking status: Every Day    Packs/day: 0.50    Years: 40.00    Total pack years: 20.00    Types: Cigarettes    Start date: 09/14/1978   Smokeless tobacco: Never  Vaping Use   Vaping Use: Never used  Substance and Sexual Activity   Alcohol use: Yes    Alcohol/week: 0.0 standard drinks of alcohol    Comment: rarely   Drug use: No   Sexual activity: Never    Birth control/protection: None  Other Topics Concern   Not on file  Social History Narrative   Marital status: married x 30+ years      Children:  2 children;(38,37); 3+3 grandchild      Lives:  With wife, son      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      Tobacco: 1 ppd x 30 years      Alcohol: rare     Regular exercise:  walking daily   Caffeine use: daily      Social Determinants of Health   Financial Resource Strain: Not on file  Food Insecurity: Not on file  Transportation Needs:  Not on file  Physical Activity: Not on file  Stress: Not on file  Social Connections: Not on file  Intimate Partner Violence: Not on file    Family History:    Family History  Problem Relation Age of Onset   Heart disease Mother    Heart attack Mother    Other Mother        thyroid problems   Kidney cancer Mother    Cirrhosis Father    Other Father        thyroid problems   Alcohol abuse Father    Hypertension Father    Mental illness Brother    Parkinsonism Brother      ROS:  Please see the history of present illness.  General:no colds or fevers, no weight changes Skin:no rashes or ulcers HEENT:no blurred vision, no congestion CV:see HPI PUL:see HPI GI:no diarrhea constipation or melena, no indigestion GU:no hematuria, no dysuria MS:no joint pain, no claudication Neuro:no syncope, no lightheadedness Endo:no diabetes, no thyroid disease  All other ROS reviewed and negative.     Physical Exam/Data:   Vitals:   09/11/22 1740 09/11/22 1745 09/11/22 1815 09/11/22 1845  BP:  102/77 108/80 (!) 123/96  Pulse: 87 (!) 34 (!) 108 (!) 148  Resp: (!) 21 (!) 21 20 (!) 24  Temp:      TempSrc:      SpO2: 90% 91% 90% 93%  Weight:      Height:       No intake or output data in the 24 hours ending 09/11/22 1912    09/11/2022    1:23 PM 09/02/2022   11:05 AM 08/14/2022   11:00 AM  Last 3 Weights  Weight (lbs) 193 lb 193 lb 195 lb  Weight (kg) 87.544 kg 87.544 kg 88.451 kg     Body mass index is 28.5 kg/m.   Exam per Dr. Johney Frame  General:  Well nourished, well developed, in no acute distress HEENT: normal Neck: no JVD Vascular: No carotid bruits; Distal pulses 2+ bilaterally Cardiac:  irreg irreg; no murmur  Lungs:  clear to auscultation bilaterally, no wheezing, rhonchi or rales  Abd: soft,  nontender, no hepatomegaly  Ext: no edema Musculoskeletal:  No deformities, BUE and BLE strength normal and equal Skin: warm and dry  Neuro:  CNs 2-12 intact, no focal abnormalities noted Psych:  Normal affect    Relevant CV Studies:  ECHO 11/2010  EF 60% and mod LA dilatation  Laboratory Data:  High Sensitivity Troponin:   Recent Labs  Lab 09/11/22 1335 09/11/22 1529  TROPONINIHS 5 5     Chemistry Recent Labs  Lab 09/11/22 1335  NA 137  K 4.3  CL 101  CO2 28  GLUCOSE 99  BUN 12  CREATININE 0.95  CALCIUM 8.8*  GFRNONAA >60  ANIONGAP 8    No results for input(s): "PROT", "ALBUMIN", "AST", "ALT", "ALKPHOS", "BILITOT" in the last 168 hours. Lipids No results for input(s): "CHOL", "TRIG", "HDL", "LABVLDL", "LDLCALC", "CHOLHDL" in the last 168 hours.  Hematology Recent Labs  Lab 09/11/22 1335  WBC 10.4  RBC 4.77  HGB 14.7  HCT 44.7  MCV 93.7  MCH 30.8  MCHC 32.9  RDW 13.0  PLT 188   Thyroid No results for input(s): "TSH", "FREET4" in the last 168 hours.  BNP Recent Labs  Lab 09/11/22 1632  BNP 225.9*    DDimer No results for input(s): "DDIMER" in the last 168 hours.   Radiology/Studies:  DG Chest 2  View  Result Date: 09/11/2022 CLINICAL DATA:  Palpitations in a 61 year old male. EXAM: CHEST - 2 VIEW COMPARISON:  May 25, 2022 FINDINGS: Cardiomediastinal contours and hilar structures are stable. Mild increased interstitial markings are present in the chest. Elevated RIGHT hemidiaphragm with juxta diaphragmatic atelectasis. No lobar consolidation. No sign of pneumothorax. On limited assessment no acute skeletal process. IMPRESSION: 1. Mild increased interstitial markings in the chest may represent mild edema or atypical infection. 2. Elevated RIGHT hemidiaphragm with juxta diaphragmatic atelectasis. Electronically Signed   By: Zetta Bills M.D.   On: 09/11/2022 13:55     Assessment and Plan:   Atrial fib with RVR at eye center.  Given IV dilt 20  and 500CC of RL.  (Pt was for cataract removal) he did take xarelto.  Now rate improved but then back up to 170.  ? PNA on CXR pt has been stable though plan for rate control was made 08/14/22 when pt back in atrial fib after maintaining SR for some time. (Originally DX in 2012 with thyroid storm)  Will add amiodarone and check echo.  Possible infection cause of increased HR.  No hx of CAD. No stress test he is on ASA 81 mg as well.  Hx PVCs none currently HTN stable on clonidine patch, coreg 25 BID and zestril.  Hypothyroid with stable TSH HLD on statin zocor 40 and continue.   Risk Assessment/Risk Scores:          CHA2DS2-VASc Score = 1   This indicates a 0.6% annual risk of stroke. The patient's score is based upon: CHF History: 0 HTN History: 1 Diabetes History: 0 Stroke History: 0 Vascular Disease History: 0 Age Score: 0 Gender Score: 0          For questions or updates, please contact [place] Please consult [email or web address] for contact info under    Signed, Cecilie Kicks, NP  09/11/2022 7:12 PM  Patient seen and examined and agree with Cecilie Kicks, NP as detailed above.  In brief, the patient is a 61 year old male with history of remote Graves disease, HTN, persistent Afib, and PVCs who presented with Afib with RVR for which Cardiology was has been consulted.  Patient with long history of Afib and has been followed by Dr. Lovena Le. He has just opted for rate control strategy and has been maintained on coreg and his flec has been stopped. He was doing well until two days ago when he developed cough and congestion. Multiple family members had been sick. He presented to the eye center today for cataract surgery where he was found to be in Afib with RVR prompting ER referral.  Here, patient remains in Afib with RVR with HR 120-160s. Received metop and dilt without much response. Trop 5. BNP 225. CXR with mild increased interstitial markings concerning for possible  edema vs infection. He does not appear overtly overloaded and suspect his SOB and cough are related to his underlying viral illness. Will start IV amiodarone for now as BP soft on arrival and add PO metop as blood pressure allows. Will check TTE. Suspect HR will improve as he recovers from his acute illness.  GEN: No acute distress.   Neck: No JVD Cardiac: Tachycardic, irregular, no murmurs Respiratory: Rhonchorous with diffuse expiratory wheezing GI: Soft, nontender, non-distended  MS: No edema; No deformity. Neuro:  Nonfocal  Psych: Normal affect   Plan: -Start amiodarone bolus +gtt -Add PO metop for additional rate control -Check TTE as no recent  echoes in our system -Continue home xarelto -Management of URI per primary  -Suspect HR will improve as he continues to recover from his acute illness -If HR remains difficult to control, can consider DCCV as patient has not missed doses of his xarelto  Gwyndolyn Kaufman, MD

## 2022-09-11 NOTE — ED Triage Notes (Signed)
Pt to ED via [facility] from [facility] for [facility] for cataract removal, (left eye dilated at eye center for procedure) pt noted to be in AFIB RVR rate 160. Hx of the same, hx of cardioversion. Pt has no complaints of chest pain, n/v. No denies No shob.  #22 L hand, 500 ML LR, '20MG'$  Cardizem given by EMS. [place]'@3L'$  not on home o2. Last VS hr 70-90., 94% 3L 126/83

## 2022-09-11 NOTE — Assessment & Plan Note (Addendum)
BP 140/100 currently, but was soft on arrival to ED. Hold home BP meds given soft initial BP and likely need to continue to add rate control medications.

## 2022-09-11 NOTE — Assessment & Plan Note (Signed)
>>  ASSESSMENT AND PLAN FOR HYPOTHYROIDISM FOLLOWING RADIOIODINE THERAPY WRITTEN ON 09/11/2022  7:41 PM BY Hillary Bow, DO  Per Dr. Lucianne Muss note from 9 days ago: Synthroid 200mg  daily on M-Sat, only 100mg  on Sundays

## 2022-09-11 NOTE — ED Provider Notes (Signed)
[facility] EMERGENCY DEPARTMENT Provider Note   CSN: [id number] Arrival date & time: 09/11/22  1317     History  Chief Complaint  Patient presents with   A FIB RVR    Jesse Europe [name] is a 61 y.o. male.  He has a history of paroxysmal A-fib and is on Coreg and blood thinner.  He said his heart rate has been up recently.  He thinks it is due to stress with his wife.  He is also had some chest congestion and took some medication for that he said had a red heart on the box without it was safe for taking with hypertension.  He went to an eye appointment today and his heart rate was noted to be 160s and EMS was called.  He received some Cardizem with some improvement in his symptoms.  He denies any chest pain or shortness of breath no leg swelling.  He still has a cough and some head congestion.  He said he has been compliant with his medications  The history is provided by the patient.  Palpitations Palpitations quality:  Fast Onset quality:  Gradual Timing:  Intermittent Progression:  Unchanged Chronicity:  Recurrent Relieved by:  Nothing Worsened by:  Nothing Ineffective treatments:  None tried Associated symptoms: cough   Associated symptoms: no chest pain, no diaphoresis, no lower extremity edema, no nausea, no shortness of breath, no vomiting and no weakness   Risk factors: hx of atrial fibrillation, hx of thyroid disease and stress        Home Medications Prior to Admission medications   Medication Sig Start Date End Date Taking? Authorizing Provider  albuterol (PROVENTIL) (2.5 MG/3ML) 0.083% nebulizer solution Take 3 mLs (2.5 mg total) by nebulization every 6 (six) hours as needed for wheezing or shortness of breath. 08/17/18   Brunetta Jeans, PA-C  albuterol (VENTOLIN HFA) 108 (90 Base) MCG/ACT inhaler INHALE 2 PUFFS INTO THE LUNGS EVERY 6 HOURS AS NEEDED FOR WHEEZING/SHORTNESS OF BREATH Patient taking differently: Inhale 2 puffs into the lungs  every 6 (six) hours as needed for shortness of breath. 04/24/22   Wendie Agreste, MD  aspirin 81 MG tablet Take 81 mg by mouth every other day.    [provider]  azelastine (ASTELIN) 0.1 % nasal spray Place 1 spray into both nostrils 2 (two) times daily. Use in each nostril as directed 06/26/21   Maximiano Coss, NP  carvedilol (COREG) 25 MG tablet Take 1 tablet (25 mg total) by mouth 2 (two) times daily. 08/14/22   Evans Lance, MD  Cholecalciferol (VITAMIN D3 PO) Take 1 tablet by mouth daily.    [provider]  cloNIDine (CATAPRES - DOSED IN MG/24 HR) 0.1 mg/24hr patch APPLY 1 PATCH ONCE A WEEK 07/24/22   [name], MD  levothyroxine (SYNTHROID) 200 MCG tablet Take 1 tablet (200 mcg total) by mouth daily. 08/25/22   [name], MD  lisinopril (ZESTRIL) 40 MG tablet TAKE 1 TABLET BY MOUTH EVERY DAY 05/13/22   Evans Lance, MD  montelukast (SINGULAIR) 10 MG tablet TAKE 1 TABLET BY MOUTH EVERYDAY AT BEDTIME Patient taking differently: Take 10 mg by mouth at bedtime. 04/01/22   Maximiano Coss, NP  ondansetron (ZOFRAN ODT) 8 MG disintegrating tablet Take 1 tablet (8 mg total) by mouth every 8 (eight) hours as needed. 01/06/22   Maximiano Coss, NP  pantoprazole (PROTONIX) 40 MG tablet TAKE 1 TABLET BY MOUTH EVERY DAY 11/06/21   Orland Mustard,  Richard, NP  rivaroxaban (XARELTO) 20 MG TABS tablet Take 1 tablet (20 mg total) by mouth daily with supper. 08/14/22   Evans Lance, MD  sertraline (ZOLOFT) 50 MG tablet Take 1 tablet (50 mg total) by mouth daily. 07/08/22   [name], MD  simvastatin (ZOCOR) 40 MG tablet TAKE 1 TABLET BY MOUTH EVERY DAY 01/05/22   Maximiano Coss, NP  tamsulosin (FLOMAX) 0.4 MG CAPS capsule TAKE 2 CAPSULES BY MOUTH EVERY DAY 04/24/22   Wendie Agreste, MD  Vitamin D, Ergocalciferol, (DRISDOL) 1.25 MG (50000 UNIT) CAPS capsule Take 1 capsule (50,000 Units total) by mouth every 7 (seven) days. 01/06/22   Maximiano Coss, NP      Allergies    Sulfa drugs  cross reactors, Codeine, and Hydrochlorothiazide    Review of Systems   Review of Systems  Constitutional:  Negative for diaphoresis and fever.  HENT:  Positive for sinus pressure. Negative for sore throat.   Respiratory:  Positive for cough. Negative for shortness of breath.   Cardiovascular:  Positive for palpitations. Negative for chest pain.  Gastrointestinal:  Negative for abdominal pain, nausea and vomiting.  Genitourinary:  Negative for dysuria.  Skin:  Negative for rash.  Neurological:  Negative for weakness.    Physical Exam Updated Vital Signs BP (!) 118/90 (BP Location: Right Arm)   Pulse 85   Temp 98.2 F (36.8 C) (Oral)   Resp (!) 24   Ht '5\' 9"'$  (1.753 m)   Wt 87.5 kg   SpO2 94%   BMI 28.50 kg/m  Physical Exam Vitals and nursing note reviewed.  Constitutional:      General: He is not in acute distress.    Appearance: He is well-developed.  HENT:     Head: Normocephalic and atraumatic.  Eyes:     Conjunctiva/sclera: Conjunctivae normal.  Cardiovascular:     Rate and Rhythm: Tachycardia present. Rhythm irregular.     Heart sounds: No murmur heard. Pulmonary:     Effort: Pulmonary effort is normal. No respiratory distress.     Breath sounds: Rhonchi present.  Abdominal:     Palpations: Abdomen is soft.     Tenderness: There is no abdominal tenderness. There is no guarding or rebound.  Musculoskeletal:        General: No swelling. Normal range of motion.     Cervical back: Neck supple.     Right lower leg: No edema.     Left lower leg: No edema.  Skin:    General: Skin is warm and dry.     Capillary Refill: Capillary refill takes less than 2 seconds.  Neurological:     General: No focal deficit present.     Mental Status: He is alert.     ED Results / Procedures / Treatments   Labs (all labs ordered are listed, but only abnormal results are displayed) Labs Reviewed  BASIC METABOLIC PANEL - Abnormal; Notable for the following components:       Result Value   Calcium 8.8 (*)    All other components within normal limits  BRAIN NATRIURETIC PEPTIDE - Abnormal; Notable for the following components:   B Natriuretic Peptide 225.9 (*)    All other components within normal limits  CBC - Abnormal; Notable for the following components:   WBC 10.9 (*)    All other components within normal limits  BASIC METABOLIC PANEL - Abnormal; Notable for the following components:   Glucose, Bld 108 (*)  Calcium 8.5 (*)    All other components within normal limits  RESP PANEL BY RT-PCR (RSV, FLU A&B, COVID)  RVPGX2  RESPIRATORY PANEL BY PCR  CBC  TROPONIN I (HIGH SENSITIVITY)  TROPONIN I (HIGH SENSITIVITY)    EKG EKG Interpretation  Date/Time:  Friday September 11 2022 16:16:07 EST Ventricular Rate:  123 PR Interval:    QRS Duration: 98 QT Interval:  327 QTC Calculation: 468 R Axis:   85 Text Interpretation: Atrial fibrillation Ventricular premature complex Borderline right axis deviation Confirmed by Aletta Edouard [id number]) on 09/11/2022 4:25:49 PM  Radiology DG Chest 2 View  Result Date: 09/11/2022 CLINICAL DATA:  Palpitations in a 61 year old male. EXAM: CHEST - 2 VIEW COMPARISON:  May 25, 2022 FINDINGS: Cardiomediastinal contours and hilar structures are stable. Mild increased interstitial markings are present in the chest. Elevated RIGHT hemidiaphragm with juxta diaphragmatic atelectasis. No lobar consolidation. No sign of pneumothorax. On limited assessment no acute skeletal process. IMPRESSION: 1. Mild increased interstitial markings in the chest may represent mild edema or atypical infection. 2. Elevated RIGHT hemidiaphragm with juxta diaphragmatic atelectasis. Electronically Signed   By: Zetta Bills M.D.   On: 09/11/2022 13:55    Procedures .Critical Care  Performed by: Hayden Rasmussen, MD Authorized by: Hayden Rasmussen, MD   Critical care provider statement:    Critical care time (minutes):  45   Critical care  time was exclusive of:  Separately billable procedures and treating other patients   Critical care was necessary to treat or prevent imminent or life-threatening deterioration of the following conditions:  Cardiac failure   Critical care was time spent personally by me on the following activities:  Development of treatment plan with patient or surrogate, discussions with consultants, evaluation of patient's response to treatment, examination of patient, obtaining history from patient or surrogate, ordering and performing treatments and interventions, ordering and review of laboratory studies, ordering and review of radiographic studies, pulse oximetry and re-evaluation of patient's condition   I assumed direction of critical care for this patient from another provider in my specialty: no       Medications Ordered in ED Medications  amiodarone (NEXTERONE) 1.8 mg/mL load via infusion 150 mg (150 mg Intravenous Bolus from Bag 09/11/22 1912)    Followed by  amiodarone (NEXTERONE PREMIX) 360-4.14 MG/200ML-% (1.8 mg/mL) IV infusion (0 mg/hr Intravenous Stopped 09/12/22 0831)    Followed by  amiodarone (NEXTERONE PREMIX) 360-4.14 MG/200ML-% (1.8 mg/mL) IV infusion (30 mg/hr Intravenous New Bag/Given 09/12/22 0831)  rivaroxaban (XARELTO) tablet 20 mg (20 mg Oral Given 09/11/22 2004)  levothyroxine (SYNTHROID) tablet 200 mcg (200 mcg Oral Given 09/12/22 0610)    And  levothyroxine (SYNTHROID) tablet 100 mcg (has no administration in time range)  pantoprazole (PROTONIX) EC tablet 40 mg (40 mg Oral Given 09/12/22 0829)  sertraline (ZOLOFT) tablet 50 mg (50 mg Oral Given 09/12/22 0829)  levalbuterol (XOPENEX) nebulizer solution 0.63 mg (has no administration in time range)  acetaminophen (TYLENOL) tablet 650 mg (has no administration in time range)  ondansetron (ZOFRAN) injection 4 mg (has no administration in time range)  guaiFENesin-dextromethorphan (ROBITUSSIN DM) 100-10 MG/5ML syrup 5 mL (has no  administration in time range)  phenol (CHLORASEPTIC) mouth spray 1 spray (has no administration in time range)  rosuvastatin (CRESTOR) tablet 20 mg (has no administration in time range)  metoprolol tartrate (LOPRESSOR) tablet 50 mg (has no administration in time range)  metoprolol tartrate (LOPRESSOR) injection 5 mg (5 mg Intravenous Given 09/11/22 1632)  metoprolol tartrate (LOPRESSOR) injection 5 mg (5 mg Intravenous Given 09/11/22 1714)    ED Course/ Medical Decision Making/ A&P Clinical Course as of 09/12/22 9470  Fri Sep 11, 2022  1850 Discussed with Dr. Johney Frame cardiology.  She is going to order some amiodarone and will evaluate the patient in the ED. [MB]  1936 Discussed with Dr. Alcario Drought [facility] hospitalist who will evaluate patient for admission. [MB]    Clinical Course User Index [MB] Hayden Rasmussen, MD                           Medical Decision Making Amount and/or Complexity of Data Reviewed Labs: ordered.  Risk Prescription drug management. Decision regarding hospitalization.   This patient complains of rapid heart rate chest congestion; this involves an extensive number of treatment Options and is a complaint that carries with it a high risk of complications and morbidity. The differential includes arrhythmia, CHF, ischemia, pneumonia, COVID, flu  I ordered, reviewed and interpreted labs, which included CBC with normal white count normal hemoglobin, chemistries unremarkable, BNP mildly elevated, troponins flat, COVID and flu negative I ordered medication IV beta-blockers and amiodarone drip and reviewed PMP when indicated. I ordered imaging studies which included chest x-ray and I independently    visualized and interpreted imaging which showed no acute findings Previous records obtained and reviewed in epic including recent cardiology notes I consulted Dr. Johney Frame cardiology and Dr. Alcario Drought [facility] hospitalist and discussed lab and imaging findings and discussed  disposition.  Cardiac monitoring reviewed, A-fib with rapid ventricular response Social determinants considered, no significant barriers Critical Interventions: Initiation of IV medications for rate control  After the interventions stated above, I reevaluated the patient and found patient to be not very symptomatic although heart rate not controlled Admission and further testing considered, he would benefit from mission to the hospital for further workup.  Patient in agreement with plan.         Final Clinical Impression(s) / ED Diagnoses Final diagnoses:  Atrial fibrillation with rapid ventricular response ([place])  Acute bronchitis, unspecified organism    Rx / DC Orders ED Discharge Orders     None         Hayden Rasmussen, MD 09/12/22 0930

## 2022-09-11 NOTE — Telephone Encounter (Signed)
error 

## 2022-09-11 NOTE — Assessment & Plan Note (Signed)
>>  ASSESSMENT AND PLAN FOR DYSLIPIDEMIA WRITTEN ON 09/11/2022  7:55 PM BY GARDNER, JARED M, DO  Continue statin

## 2022-09-11 NOTE — Assessment & Plan Note (Addendum)
See cards consult note PO Lopressor Amiodarone infusion Cardiovert if still unable to control, no missed home xarelto doses Cont Xarelto 2d echo Cards doesn't think he's in CHF today Tele monitor

## 2022-09-11 NOTE — ED Provider Triage Note (Signed)
Emergency Medicine Provider Triage Evaluation Note  Jesse Europe [name] , a 61 y.o. male  was evaluated in triage.  Pt presents with EMS with concern for afib RVR.  He states that this morning he went to the eye doctor to have cataract surgery on his right eye.  They had dilated his eye and then realized he was in A-fib RVR with a rate in 160s.  EMS was called gave Cardizem with improvement.  Patient states he did not know he was in A-fib.  Denies any chest pain or shortness of breath.  Patient is anticoagulated with Xarelto and did not have to stop this medication for his cataract surgery.  Review of Systems  Positive:  Negative:   Physical Exam  BP 122/79   Pulse 93   Temp 98.3 F (36.8 C) (Oral)   Resp 15   Ht '5\' 9"'$  (1.753 m)   Wt 87.5 kg   SpO2 90%   BMI 28.50 kg/m  Gen:   Awake, no distress   Resp:  Normal effort  MSK:   Moves extremities without difficulty  Other:    Medical Decision Making  Medically screening exam initiated at 1:28 PM.  Appropriate orders placed.  Jesse Europe [name] was informed that the remainder of the evaluation will be completed by another provider, this initial triage assessment does not replace that evaluation, and the importance of remaining in the ED until their evaluation is complete.     Bud Face, PA-C 09/11/22 1330

## 2022-09-11 NOTE — Assessment & Plan Note (Signed)
Continue statin. 

## 2022-09-11 NOTE — H&P (Signed)
History and Physical    Patient: Jesse Suarez QMV:[id number] DOB: 1961/07/31 DOA: 09/11/2022 DOS: the patient was seen and examined on 09/11/2022 PCP: Pcp, No  Patient coming from: Home  Chief Complaint:  Chief Complaint  Patient presents with   A FIB RVR   HPI: Jesse HAEN [name] is a 61 y.o. male with medical history significant of grave's disease s/p radioactive iodine ablation, HTN, PAF.  Pt previously on rhythm control therapy, but after this didn't work cards went back to rate control therapy.  [facility] and found to be in a fib with RVR, rate 160. No chest pain or SOB. He was given 20 mg dilt by EMS and 500 CC LR.  HR improved some, given IV BB in ED and that seemed to just drop his BP.  Cards consult in ED: they put pt on PO BB + amiodarone gtt.  Pt and whole family sick with URI symptoms past 2 days.  Cough and congestion.  Taking all meds at home including Xarelto.   Review of Systems: As mentioned in the history of present illness. All other systems reviewed and are negative. Past Medical History:  Diagnosis Date   Allergy    Anxiety    Atrial fibrillation ([place])    Bell palsy    Depression    Diastolic heart failure    Hypercholesterolemia    Hypertension    Pneumonia    Thyroid disease    Past Surgical History:  Procedure Laterality Date   CHOLECYSTECTOMY     HERNIA REPAIR     KNEE SURGERY     VASECTOMY     Social History:  reports that he has been smoking cigarettes. He started smoking about 44 years ago. He has a 20.00 pack-year smoking history. He has never used smokeless tobacco. He reports current alcohol use. He reports that he does not use drugs.  Allergies  Allergen Reactions   Sulfa Drugs Cross Reactors Hives   Codeine Other (See Comments)    Made him very jittery    Hydrochlorothiazide Other (See Comments)    Hyponatremia    Family History  Problem Relation Age of  Onset   Heart disease Mother    Heart attack Mother    Other Mother        thyroid problems   Kidney cancer Mother    Cirrhosis Father    Other Father        thyroid problems   Alcohol abuse Father    Hypertension Father    Mental illness Brother    Parkinsonism Brother     Prior to Admission medications   Medication Sig Start Date End Date Taking? Authorizing Provider  albuterol (PROVENTIL) (2.5 MG/3ML) 0.083% nebulizer solution Take 3 mLs (2.5 mg total) by nebulization every 6 (six) hours as needed for wheezing or shortness of breath. 08/17/18   Brunetta Jeans, PA-C  albuterol (VENTOLIN HFA) 108 (90 Base) MCG/ACT inhaler INHALE 2 PUFFS INTO THE LUNGS EVERY 6 HOURS AS NEEDED FOR WHEEZING/SHORTNESS OF BREATH Patient taking differently: Inhale 2 puffs into the lungs every 6 (six) hours as needed for shortness of breath. 04/24/22   Wendie Agreste, MD  aspirin 81 MG tablet Take 81 mg by mouth every other day.    [provider]  azelastine (ASTELIN) 0.1 % nasal spray Place 1 spray into both nostrils 2 (two) times daily. Use in each nostril as directed  06/26/21   Maximiano Coss, NP  carvedilol (COREG) 25 MG tablet Take 1 tablet (25 mg total) by mouth 2 (two) times daily. 08/14/22   Evans Lance, MD  Cholecalciferol (VITAMIN D3 PO) Take 1 tablet by mouth daily.    [provider]  cloNIDine (CATAPRES - DOSED IN MG/24 HR) 0.1 mg/24hr patch APPLY 1 PATCH ONCE A WEEK 07/24/22   [name], MD  levothyroxine (SYNTHROID) 200 MCG tablet Take 1 tablet (200 mcg total) by mouth daily. 08/25/22   [name], MD  lisinopril (ZESTRIL) 40 MG tablet TAKE 1 TABLET BY MOUTH EVERY DAY 05/13/22   Evans Lance, MD  montelukast (SINGULAIR) 10 MG tablet TAKE 1 TABLET BY MOUTH EVERYDAY AT BEDTIME Patient taking differently: Take 10 mg by mouth at bedtime. 04/01/22   Maximiano Coss, NP  ondansetron (ZOFRAN ODT) 8 MG disintegrating tablet Take 1 tablet (8 mg total) by mouth every 8  (eight) hours as needed. 01/06/22   Maximiano Coss, NP  pantoprazole (PROTONIX) 40 MG tablet TAKE 1 TABLET BY MOUTH EVERY DAY 11/06/21   Maximiano Coss, NP  rivaroxaban (XARELTO) 20 MG TABS tablet Take 1 tablet (20 mg total) by mouth daily with supper. 08/14/22   Evans Lance, MD  sertraline (ZOLOFT) 50 MG tablet Take 1 tablet (50 mg total) by mouth daily. 07/08/22   [name], MD  simvastatin (ZOCOR) 40 MG tablet TAKE 1 TABLET BY MOUTH EVERY DAY 01/05/22   Maximiano Coss, NP  tamsulosin (FLOMAX) 0.4 MG CAPS capsule TAKE 2 CAPSULES BY MOUTH EVERY DAY 04/24/22   Wendie Agreste, MD  Vitamin D, Ergocalciferol, (DRISDOL) 1.25 MG (50000 UNIT) CAPS capsule Take 1 capsule (50,000 Units total) by mouth every 7 (seven) days. 01/06/22   Maximiano Coss, NP    Physical Exam: Vitals:   09/11/22 1845 09/11/22 1900 09/11/22 1930 09/11/22 1945  BP: (!) 123/96 (!) 140/101 (!) 106/91 113/88  Pulse: (!) 148 75 92 (!) 162  Resp: (!) 24 (!) 21 (!) 30 (!) 27  Temp:      TempSrc:      SpO2: 93% 96% 93% 94%  Weight:      Height:       Constitutional: NAD, calm, comfortable Eyes: PERRL, lids and conjunctivae normal ENMT: Mucous membranes are moist. Posterior pharynx clear of any exudate or lesions.Normal dentition.  Neck: normal, supple, no masses, no thyromegaly Respiratory: Few rhonchi Cardiovascular: Tachycardia, IRR, IRR Abdomen: no tenderness, no masses palpated. No hepatosplenomegaly. Bowel sounds positive.  Musculoskeletal: no clubbing / cyanosis. No joint deformity upper and lower extremities. Good ROM, no contractures. Normal muscle tone.  Skin: no rashes, lesions, ulcers. No induration Neurologic: CN 2-12 grossly intact. Sensation intact, DTR normal. Strength 5/5 in all 4.  Psychiatric: Normal judgment and insight. Alert and oriented x 3. Normal mood.   Data Reviewed:       Latest Ref Rng & Units 09/11/2022    1:35 PM 05/25/2022   12:49 PM 05/12/2022    2:57 AM  CBC  WBC 4.0 - 10.5  K/uL 10.4  14.6  9.9   Hemoglobin 13.0 - 17.0 g/dL 14.7  14.9  12.7   Hematocrit 39.0 - 52.0 % 44.7  44.0  37.7   Platelets 150 - 400 K/uL 188  322  196       Latest Ref Rng & Units 09/11/2022    1:35 PM 08/31/2022    9:23 AM 07/06/2022   10:26 AM  CMP  Glucose 70 -  99 mg/dL 99  110  112   BUN 8 - 23 mg/dL '12  13  11   '$ Creatinine 0.61 - 1.24 mg/dL 0.95  0.95  0.78   Sodium 135 - 145 mmol/L 137  138  135   Potassium 3.5 - 5.1 mmol/L 4.3  4.2  4.8   Chloride 98 - 111 mmol/L 101  100  100   CO2 22 - 32 mmol/L '28  29  29   '$ Calcium 8.9 - 10.3 mg/dL 8.8  9.6  9.3    Trop neg x2 COVID, Flu, RSV = neg BNP 225.9  CXR IMPRESSION: 1. Mild increased interstitial markings in the chest may represent mild edema or atypical infection. 2. Elevated RIGHT hemidiaphragm with juxta diaphragmatic atelectasis.  Assessment and Plan: * Paroxysmal atrial fibrillation with RVR ([place]) See cards consult note PO Lopressor Amiodarone infusion Cardiovert if still unable to control, no missed home xarelto doses Cont Xarelto 2d echo Cards doesn't think he's in CHF today Tele monitor  URI (upper respiratory infection) Multiple family members sick with similar symptoms, normal WBC (very likely viral). COVID, FLU, and RSV negative (a bit surprising given the high volume of these viruses we are seeing in the local area the past couple of days). Check RVP If neg, consider coverage for atypical PNA. Use xopenex (instead of albuterol) if needed for wheezing / SOB.  Dyslipidemia Continue statin  Essential hypertension BP 140/100 currently, but was soft on arrival to ED. Hold home BP meds given soft initial BP and likely need to continue to add rate control medications.  Hypothyroidism following radioiodine therapy Per Dr. Dwyane Dee note from 9 days ago: Synthroid '200mg'$  daily on M-Sat, only '100mg'$  on Sundays      Advance Care Planning:   Code Status: Full Code  Consults: [facility] cards  Family  Communication: No family in room  Severity of Illness: The appropriate patient status for this patient is OBSERVATION. Observation status is judged to be reasonable and necessary in order to provide the required intensity of service to ensure the patient's safety. The patient's presenting symptoms, physical exam findings, and initial radiographic and laboratory data in the context of their medical condition is felt to place them at decreased risk for further clinical deterioration. Furthermore, it is anticipated that the patient will be medically stable for discharge from the hospital within 2 midnights of admission.   Author: Etta Quill., DO 09/11/2022 7:57 PM  For on call review [email or web address].

## 2022-09-11 NOTE — Assessment & Plan Note (Addendum)
Multiple family members sick with similar symptoms, normal WBC (very likely viral). COVID, FLU, and RSV negative (a bit surprising given the high volume of these viruses we are seeing in the local area the past couple of days). Check RVP If neg, consider coverage for atypical PNA. Use xopenex (instead of albuterol) if needed for wheezing / SOB.

## 2022-09-12 ENCOUNTER — Other Ambulatory Visit (HOSPITAL_COMMUNITY): Payer: 59

## 2022-09-12 ENCOUNTER — Observation Stay (HOSPITAL_COMMUNITY): Payer: 59

## 2022-09-12 DIAGNOSIS — B9789 Other viral agents as the cause of diseases classified elsewhere: Secondary | ICD-10-CM | POA: Diagnosis present

## 2022-09-12 DIAGNOSIS — I4891 Unspecified atrial fibrillation: Secondary | ICD-10-CM | POA: Diagnosis not present

## 2022-09-12 DIAGNOSIS — J129 Viral pneumonia, unspecified: Secondary | ICD-10-CM | POA: Diagnosis present

## 2022-09-12 DIAGNOSIS — E7800 Pure hypercholesterolemia, unspecified: Secondary | ICD-10-CM | POA: Diagnosis present

## 2022-09-12 DIAGNOSIS — E890 Postprocedural hypothyroidism: Secondary | ICD-10-CM | POA: Diagnosis present

## 2022-09-12 DIAGNOSIS — F419 Anxiety disorder, unspecified: Secondary | ICD-10-CM | POA: Diagnosis present

## 2022-09-12 DIAGNOSIS — Z79890 Hormone replacement therapy (postmenopausal): Secondary | ICD-10-CM | POA: Diagnosis not present

## 2022-09-12 DIAGNOSIS — I10 Essential (primary) hypertension: Secondary | ICD-10-CM | POA: Diagnosis not present

## 2022-09-12 DIAGNOSIS — Z8249 Family history of ischemic heart disease and other diseases of the circulatory system: Secondary | ICD-10-CM | POA: Diagnosis not present

## 2022-09-12 DIAGNOSIS — Z1152 Encounter for screening for COVID-19: Secondary | ICD-10-CM | POA: Diagnosis not present

## 2022-09-12 DIAGNOSIS — F17210 Nicotine dependence, cigarettes, uncomplicated: Secondary | ICD-10-CM | POA: Diagnosis present

## 2022-09-12 DIAGNOSIS — J449 Chronic obstructive pulmonary disease, unspecified: Secondary | ICD-10-CM | POA: Diagnosis not present

## 2022-09-12 DIAGNOSIS — Z7982 Long term (current) use of aspirin: Secondary | ICD-10-CM | POA: Diagnosis not present

## 2022-09-12 DIAGNOSIS — Z818 Family history of other mental and behavioral disorders: Secondary | ICD-10-CM | POA: Diagnosis not present

## 2022-09-12 DIAGNOSIS — I5032 Chronic diastolic (congestive) heart failure: Secondary | ICD-10-CM | POA: Diagnosis present

## 2022-09-12 DIAGNOSIS — Z811 Family history of alcohol abuse and dependence: Secondary | ICD-10-CM | POA: Diagnosis not present

## 2022-09-12 DIAGNOSIS — I110 Hypertensive heart disease with heart failure: Secondary | ICD-10-CM | POA: Diagnosis present

## 2022-09-12 DIAGNOSIS — I4819 Other persistent atrial fibrillation: Secondary | ICD-10-CM | POA: Diagnosis present

## 2022-09-12 DIAGNOSIS — J069 Acute upper respiratory infection, unspecified: Secondary | ICD-10-CM | POA: Diagnosis present

## 2022-09-12 DIAGNOSIS — Z8051 Family history of malignant neoplasm of kidney: Secondary | ICD-10-CM | POA: Diagnosis not present

## 2022-09-12 DIAGNOSIS — Z7901 Long term (current) use of anticoagulants: Secondary | ICD-10-CM | POA: Diagnosis not present

## 2022-09-12 DIAGNOSIS — Z882 Allergy status to sulfonamides status: Secondary | ICD-10-CM | POA: Diagnosis not present

## 2022-09-12 DIAGNOSIS — Z79899 Other long term (current) drug therapy: Secondary | ICD-10-CM | POA: Diagnosis not present

## 2022-09-12 DIAGNOSIS — J209 Acute bronchitis, unspecified: Secondary | ICD-10-CM | POA: Diagnosis present

## 2022-09-12 DIAGNOSIS — I493 Ventricular premature depolarization: Secondary | ICD-10-CM | POA: Diagnosis present

## 2022-09-12 DIAGNOSIS — F17200 Nicotine dependence, unspecified, uncomplicated: Secondary | ICD-10-CM | POA: Diagnosis not present

## 2022-09-12 DIAGNOSIS — E785 Hyperlipidemia, unspecified: Secondary | ICD-10-CM | POA: Diagnosis present

## 2022-09-12 DIAGNOSIS — I480 Paroxysmal atrial fibrillation: Secondary | ICD-10-CM | POA: Diagnosis not present

## 2022-09-12 HISTORY — DX: Unspecified atrial fibrillation: I48.91

## 2022-09-12 LAB — CBC
HCT: 43.7 % (ref 39.0–52.0)
Hemoglobin: 14.2 g/dL (ref 13.0–17.0)
MCH: 30.5 pg (ref 26.0–34.0)
MCHC: 32.5 g/dL (ref 30.0–36.0)
MCV: 94 fL (ref 80.0–100.0)
Platelets: 190 10*3/uL (ref 150–400)
RBC: 4.65 MIL/uL (ref 4.22–5.81)
RDW: 13.1 % (ref 11.5–15.5)
WBC: 10.9 10*3/uL — ABNORMAL HIGH (ref 4.0–10.5)
nRBC: 0 % (ref 0.0–0.2)

## 2022-09-12 LAB — BASIC METABOLIC PANEL
Anion gap: 10 (ref 5–15)
BUN: 13 mg/dL (ref 8–23)
CO2: 26 mmol/L (ref 22–32)
Calcium: 8.5 mg/dL — ABNORMAL LOW (ref 8.9–10.3)
Chloride: 102 mmol/L (ref 98–111)
Creatinine, Ser: 0.95 mg/dL (ref 0.61–1.24)
GFR, Estimated: >60 mL/min (ref >60)
Glucose, Bld: 108 mg/dL — ABNORMAL HIGH (ref 70–99)
Potassium: 3.7 mmol/L (ref 3.5–5.1)
Sodium: 138 mmol/L (ref 135–145)

## 2022-09-12 LAB — ECHOCARDIOGRAM COMPLETE
Height: 69 in
S' Lateral: 3.58 cm
Weight: 3088 oz

## 2022-09-12 MED ORDER — OFLOXACIN 0.3 % OP SOLN
1.0000 [drp] | Freq: Four times a day (QID) | OPHTHALMIC | Status: DC
  Administered 2022-09-12 – 2022-09-15 (×10): 1 [drp] via OPHTHALMIC
  Filled 2022-09-12: qty 5

## 2022-09-12 MED ORDER — ROSUVASTATIN CALCIUM 20 MG PO TABS: 20.0000 mg | ORAL_TABLET | Freq: Every day | ORAL | Status: DC

## 2022-09-12 MED ORDER — ROSUVASTATIN CALCIUM 20 MG PO TABS
20.0000 mg | ORAL_TABLET | Freq: Every day | ORAL | Status: DC
  Administered 2022-09-13 – 2022-09-15 (×3): 20 mg via ORAL
  Filled 2022-09-12 (×3): qty 1

## 2022-09-12 MED ORDER — PREDNISOLONE ACETATE 1 % OP SUSP
1.0000 [drp] | Freq: Four times a day (QID) | OPHTHALMIC | Status: DC
  Administered 2022-09-12 – 2022-09-15 (×10): 1 [drp] via OPHTHALMIC
  Filled 2022-09-12: qty 5

## 2022-09-12 MED ORDER — CLONAZEPAM 0.5 MG PO TABS
0.5000 mg | ORAL_TABLET | Freq: Two times a day (BID) | ORAL | Status: DC | PRN
  Administered 2022-09-12 – 2022-09-13 (×3): 0.5 mg via ORAL
  Filled 2022-09-12 (×4): qty 1

## 2022-09-12 MED ORDER — METOPROLOL TARTRATE 50 MG PO TABS
50.0000 mg | ORAL_TABLET | Freq: Three times a day (TID) | ORAL | Status: DC
  Administered 2022-09-12 (×2): 50 mg via ORAL
  Filled 2022-09-12 (×2): qty 1

## 2022-09-12 MED ORDER — MONTELUKAST SODIUM 10 MG PO TABS
10.0000 mg | ORAL_TABLET | Freq: Every day | ORAL | Status: DC
  Administered 2022-09-12 – 2022-09-14 (×3): 10 mg via ORAL
  Filled 2022-09-12 (×3): qty 1

## 2022-09-12 NOTE — Progress Notes (Signed)
**Note De-Identified vi Obfusction** PROGRESS NOTE    LYTHN HYTER [name]  XBL:[id number] DOB: 1960/09/22 DO: 09/11/2022 PCP: Pcp, No  Chief Complint  Ptient presents with    FIB RVR    Brief Nrrtive:  Jesse Europe [name] is  61 y.o. mle with medicl history significnt of grve's disese s/p rdioctive iodine bltion, HTN, PF.   Pt previously on rhythm control therpy, but fter this didn't work crds went bck to rte control therpy.   Tody pt ws t surgicl eye ssocites for pt for ctrct removl nd found to be in  fib with RVR, rte 160. No chest pin or SOB. He ws given 20 mg dilt by EMS nd 500 CC LR.   HR improved some, given IV BB in ED nd tht seemed to just drop his BP.   Crds consult in ED: they put pt on PO BB + miodrone gtt.   Pt nd whole fmily sick with URI symptoms pst 2 dys.  Cough nd congestion.   Tking ll meds t home including Xrelto.  ssessment & Pln:   Principl Problem:   Proxysml tril fibrilltion with RVR (HCC) ctive Problems:   URI (upper respirtory infection)   Hypothyroidism following rdioiodine therpy   Essentil hypertension   Dyslipidemi   tril fibrilltion with RVR (HCC)  tril Fibrilltion with RVR pprecite crds ssistnce - currently on miodrone, metoprolol.  Consider DC crdioversion (pt hs not missed doses of xrelto) vs bltion outptient. Echo with EF 40-45% mio gtt Metoprolol 50 mg TID Xrelto  HFmrEF pprecite crds recs Currently ppers euvolemic  Virl Infection CXR with mild edem vs typicl infection Negtive covid, influenz, RSV Follow RVP  Hypertension Lisinopril on hold Now on metoprolol Continue clonidine ptch   Dyslipidemi Sttin   Hypothyroidism TSH 12/18 ws suppressed t 0.26 Dr. Dwyne Dee recommended 200 mcg synthroid dily with 1/2 pill on Sundys  Recent Ctrct Surgery Hs eye drops he'll need continued Will follow up fter phrmcy reviews these s he didn't  rememer    DVT prophylxis: xrelto Code Sttus: full Fmily Communiction: none Disposition:   Sttus is: Inptient Remins inptient pproprite becuse: continued need for mngement of rvr   Consultnts:  Crdiology  Procedures:  Echo IMPRESSIONS     1. Left ventriculr ejection frction, by estimtion, is 40 to 45%. The  left ventricle hs mildly decresed function. The left ventricle  demonstrtes globl hypokinesis. There is mild concentric left ventriculr  hypertrophy. Left ventriculr distolic  function could not be evluted.   2. Right ventriculr systolic function is norml. The right ventriculr  size is mildly enlrged. There is norml pulmonry rtery systolic  pressure. The estimted right ventriculr systolic pressure is 30.0 mmHg.   3. Left tril size ws modertely dilted.   4. Right tril size ws modertely dilted.   5. The mitrl vlve is norml in structure. Mild mitrl vlve  regurgittion.   6. The ortic vlve is tricuspid. ortic vlve regurgittion is not  visulized. No ortic stenosis is present.   7. The inferior ven cv is norml in size with greter thn 50%  respirtory vribility, suggesting right tril pressure of 3 mmHg.   ntimicrobils:  nti-infectives (From dmission, onwrd)    None       Subjective: C/o nxiety Might be from fst HR, he's not sure sking bout meds for recent ctrct surgery  Objective: Vitls:   09/12/22 0634 09/12/22 0741 09/12/22 0829 09/12/22 1651  BP: 133/83 (!) 103/91 (!) 109/95 (!) 135/103  Pulse:  Mrlnd Kitchen) **Note De-Identified vi Obfusction** 118 (!) 115 (!) 124  Resp:  19  19  Temp:  98.7 F (37.1 C)  98 F (36.7 C)  TempSrc:  Orl  Orl  SpO2:   95% 96%  Weight:      Height:        Intke/Output Summry (Lst 24 hours) t 09/12/2022 1741 Lst dt filed t 09/12/2022 0700 Gross per 24 hour  Intke 435.57 ml  Output 400 ml  Net 35.57 ml   Filed Weights   09/11/22 1323  Weight: 87.5 kg     Exmintion:  Generl exm: ppers clm nd comfortble  Respirtory system: unlbored Crdiovsculr system: irregulrly irregulr, tchycrdic Gstrointestinl system: bdomen is nondistended, soft nd nontender.  Centrl nervous system: lert nd oriented. No focl neurologicl deficits. Extremities: no LEE    Dt Reviewed: I hve personlly reviewed following lbs nd imging studies  CBC: Recent Lbs  Lb 09/11/22 1335 09/12/22 0142  WBC 10.4 10.9*  HGB 14.7 14.2  HCT 44.7 43.7  MCV 93.7 94.0  PLT 188 478    Bsic Metbolic Pnel: Recent Lbs  Lb 09/11/22 1335 09/12/22 0142  N 137 138  K 4.3 3.7  CL 101 102  CO2 28 26  GLUCOSE 99 108*  BUN 12 13  CRETININE 0.95 0.95  CLCIUM 8.8* 8.5*    GFR: Estimted Cretinine Clernce: 87.1 mL/min (by [place] formul bsed on SCr of 0.95 mg/dL).  Liver Function Tests: No results for input(s): "ST", "LT", "LKPHOS", "BILITOT", "PROT", "LBUMIN" in the lst 168 hours.  CBG: No results for input(s): "GLUCP" in the lst 168 hours.   Recent Results (from the pst 240 hour(s))  Resp pnel by RT-PCR (RSV, Flu &B, Covid) nterior Nsl Swb     Sttus: None   Collection Time: 09/11/22  4:32 PM   Specimen: nterior Nsl Swb  Result Vlue Ref Rnge Sttus   SRS Coronvirus 2 by RT PCR NEGTIVE NEGTIVE Finl    Comment: (NOTE) SRS-CoV-2 trget nucleic cids re NOT DETECTED.  The SRS-CoV-2 RN is generlly detectble in upper respirtory specimens during the cute phse of infection. The lowest concentrtion of SRS-CoV-2 virl copies this ssy cn detect is 138 copies/mL.  negtive result does not preclude SRS-Cov-2 infection nd should not be used s the sole bsis for tretment or other ptient mngement decisions.  negtive result my occur with  improper specimen collection/hndling, submission of specimen other thn nsophryngel swb, presence of virl muttion(s) within the res  trgeted by this ssy, nd indequte number of virl copies(<138 copies/mL).  negtive result must be combined with clinicl observtions, ptient history, nd epidemiologicl informtion. The expected result is Negtive.  Fct Sheet for Ptients:  EntrepreneurPulse.com.u  Fct Sheet for [employer] Providers:  IncredibleEmployment.be  This test is no t yet pproved or clered by the [place] FD nd  hs been uthorized for detection nd/or dignosis of SRS-CoV-2 by FD under n Emergency Use uthoriztion (EU). This EU will remin  in effect (mening this test cn be used) for the durtion of the COVID-19 declrtion under Section 564(b)(1) of the ct, 21 U.S.C.section 360bbb-3(b)(1), unless the uthoriztion is terminted  or revoked sooner.       Influenz  by PCR NEGTIVE NEGTIVE Finl   Influenz B by PCR NEGTIVE NEGTIVE Finl    Comment: (NOTE) The Xpert Xpress SRS-CoV-2/FLU/RSV plus ssy is intended s n id in the dignosis of influenz from Nsophryngel swb specimens nd should not be used s  sole bsis for tretment. Nsl  washings and aspirates are unacceptable for Xpert Xpress SARS-CoV-2/FLU/RSV testing.  Fact Sheet for Patients: EntrepreneurPulse.com.au  Fact Sheet for Healthcare Providers: IncredibleEmployment.be  This test is not yet approved or cleared by the [place] FDA and has been authorized for detection and/or diagnosis of SARS-CoV-2 by FDA under an Emergency Use Authorization (EUA). This EUA will remain in effect (meaning this test can be used) for the duration of the COVID-19 declaration under Section 564(b)(1) of the Act, 21 U.S.C. section 360bbb-3(b)(1), unless the authorization is terminated or revoked.     Resp Syncytial Virus by PCR NEGATIVE NEGATIVE Final    Comment: (NOTE) Fact Sheet for  Patients: EntrepreneurPulse.com.au  Fact Sheet for Healthcare Providers: IncredibleEmployment.be  This test is not yet approved or cleared by the [place] FDA and has been authorized for detection and/or diagnosis of SARS-CoV-2 by FDA under an Emergency Use Authorization (EUA). This EUA will remain in effect (meaning this test can be used) for the duration of the COVID-19 declaration under Section 564(b)(1) of the Act, 21 U.S.C. section 360bbb-3(b)(1), unless the authorization is terminated or revoked.  Performed at [facility], [place] [address] [place], [place] [address]          Radiology Studies: ECHOCARDIOGRAM COMPLETE  Result Date: 09/12/2022    ECHOCARDIOGRAM REPORT   Patient Name:   Jesse NAVARRETE [name] Date of Exam: 09/12/2022 Medical Rec #:  [id number]            Height:       69.0 in Accession #:    [id number]           Weight:       193.0 lb Date of Birth:  1961-02-23            BSA:          2.035 m Patient Age:    34 years             BP:           119/92 mmHg Patient Gender: M                    HR:           130 bpm. Exam Location:  Inpatient Procedure: 2D Echo, Cardiac Doppler and Color Doppler Indications:    Atrial Fibrillation  History:        Patient has no prior history of Echocardiogram examinations.                 Diastolic HF, Arrythmias:Atrial Fibrillation; Risk                 Factors:Hypertension. Graves disease.  Sonographer:    Eartha Inch Referring Phys: Gwyndolyn Kaufman, E  Sonographer Comments: Image acquisition challenging due to patient body habitus. IMPRESSIONS  1. Left ventricular ejection fraction, by estimation, is 40 to 45%. The left ventricle has mildly decreased function. The left ventricle demonstrates global hypokinesis. There is mild concentric left ventricular hypertrophy. Left ventricular diastolic function could not be evaluated.  2. Right ventricular systolic function is normal. The right  ventricular size is mildly enlarged. There is normal pulmonary artery systolic pressure. The estimated right ventricular systolic pressure is 41.6 mmHg.  3. Left atrial size was moderately dilated.  4. Right atrial size was moderately dilated.  5. The mitral valve is normal in structure. Mild mitral valve regurgitation.  6. The aortic valve is tricuspid. Aortic valve regurgitation is not visualized. No aortic stenosis **Note De-Identified vi Obfusction** is present.  7. The inferior ven cv is norml in size with greter thn 50% respirtory vribility, suggesting right tril pressure of 3 mmHg. FINDINGS  Left Ventricle: Left ventriculr ejection frction, by estimtion, is 40 to 45%. The left ventricle hs mildly decresed function. The left ventricle demonstrtes globl hypokinesis. The left ventriculr internl cvity size ws norml in size. There is  mild concentric left ventriculr hypertrophy. Left ventriculr distolic function could not be evluted due to tril fibrilltion. Left ventriculr distolic function could not be evluted. Right Ventricle: The right ventriculr size is mildly enlrged. No increse in right ventriculr wll thickness. Right ventriculr systolic function is norml. There is norml pulmonry rtery systolic pressure. The tricuspid regurgitnt velocity is 2.14  m/s, nd with n ssumed right tril pressure of 3 mmHg, the estimted right ventriculr systolic pressure is 31.4 mmHg. Left Atrium: Left tril size ws modertely dilted. Right Atrium: Right tril size ws modertely dilted. Pericrdium: There is no evidence of pericrdil effusion. Mitrl Vlve: The mitrl vlve is norml in structure. Mild mitrl vlve regurgittion. Tricuspid Vlve: The tricuspid vlve is norml in structure. Tricuspid vlve regurgittion is trivil. Aortic Vlve: The ortic vlve is tricuspid. Aortic vlve regurgittion is not visulized. No ortic stenosis is present. Pulmonic Vlve: The pulmonic vlve ws norml in structure.  Pulmonic vlve regurgittion is not visulized. Aort: The ortic root nd scending ort re structurlly norml, with no evidence of dilittion. Venous: The inferior ven cv is norml in size with greter thn 50% respirtory vribility, suggesting right tril pressure of 3 mmHg. IAS/Shunts: The intertril septum ws not well visulized.  LEFT VENTRICLE PLAX 2D LVIDd:         4.50 cm LVIDs:         3.58 cm LV PW:         1.30 cm LV IVS:        1.30 cm  LEFT ATRIUM         Index LA dim:    4.36 cm 2.14 cm/m  TRICUSPID VALVE TR Pek grd:   18.3 mmHg TR Vmx:        214.00 cm/s Snd Klein MD Electroniclly signed by Snd Klein MD Signture Dte/Time: 09/12/2022/3:02:12 PM    Finl    DG Chest 2 View  Result Dte: 09/11/2022 CLINICAL DATA:  Plpittions in  62 yer old mle. EXAM: CHEST - 2 VIEW COMPARISON:  May 25, 2022 FINDINGS: Crdiomedistinl contours nd hilr structures re stble. Mild incresed interstitil mrkings re present in the chest. Elevted RIGHT hemidiphrgm with juxt diphrgmtic telectsis. No lobr consolidtion. No sign of pneumothorx. On limited ssessment no cute skeletl process. IMPRESSION: 1. Mild incresed interstitil mrkings in the chest my represent mild edem or typicl infection. 2. Elevted RIGHT hemidiphrgm with juxt diphrgmtic telectsis. Electroniclly Signed   By: Zett Bills M.D.   On: 09/11/2022 13:55        Scheduled Meds:  levothyroxine  200 mcg Orl Once per dy on Mon Tue Wed Thu Fri St   And   [START ON 09/13/2022] levothyroxine  100 mcg Orl Q Sun   metoprolol trtrte  50 mg Orl TID   pntoprzole  40 mg Orl Dily   rivroxbn  20 mg Orl Q supper   [START ON 09/13/2022] rosuvsttin  20 mg Orl Dily   sertrline  50 mg Orl Dily   Continuous Infusions:  miodrone 30 mg/hr (09/12/22 0831)     LOS: 0 dys    Time spent: over 30 min  Fayrene Helper, MD [facility]   To contact  the attending provider between 7A-7P or the covering provider during after hours 7P-7A, please log into the web site [email or web address] and access using universal [place] password for that web site. If you do not have the password, please call the hospital operator.  09/12/2022, 5:41 PM

## 2022-09-12 NOTE — Progress Notes (Signed)
   09/12/22 0829  Assess: MEWS Score  BP (!) 109/95  Pulse Rate (!) 115  ECG Heart Rate (!) 122  SpO2 95 %  Assess: MEWS Score  MEWS Temp 0  MEWS Systolic 0  MEWS Pulse 2  MEWS RR 0  MEWS LOC 0  MEWS Score 2  MEWS Score Color Yellow  Assess: if the MEWS score is Yellow or Red  Were vital signs taken at a resting state? Yes  Focused Assessment No change from prior assessment  Does the patient meet 2 or more of the SIRS criteria? No  MEWS guidelines implemented *See Row Information* Yes  Treat  MEWS Interventions Escalated (See documentation below)  Take Vital Signs  Increase Vital Sign Frequency  Yellow: Q 2hr X 2 then Q 4hr X 2, if remains yellow, continue Q 4hrs  Escalate  MEWS: Escalate Yellow: discuss with charge nurse/RN and consider discussing with provider and RRT  Notify: Charge Nurse/RN  Name of Charge Nurse/RN Notified  , rn  Date Charge Nurse/RN Notified 09/12/22  Time Charge Nurse/RN Notified [phone]  Document  Patient Outcome Other (Comment) (stable in room, iv amio running)  Progress note created (see row info) Yes  Assess: SIRS CRITERIA  SIRS Temperature  0  SIRS Pulse 1  SIRS Respirations  0  SIRS WBC 0  SIRS Score Sum  1

## 2022-09-12 NOTE — Progress Notes (Signed)
  Echocardiogram 2D Echocardiogram has been performed.  Jesse Suarez 09/12/2022, 2:48 PM

## 2022-09-12 NOTE — Progress Notes (Signed)
Rounding Note    Patient Name: Jesse CHRISLEY [name] Date of Encounter: 09/12/2022   [facility] Cardiologist: Cristopher Peru, MD   Subjective   Sitting on edge of bed.  Feeling a little bit better.  Inpatient Medications    Scheduled Meds:  levothyroxine  200 mcg Oral Once per day on Mon Tue Wed Thu Fri Sat   And   [START ON 09/13/2022] levothyroxine  100 mcg Oral Q Sun   metoprolol tartrate  25 mg Oral TID   pantoprazole  40 mg Oral Daily   rivaroxaban  20 mg Oral Q supper   sertraline  50 mg Oral Daily   simvastatin  40 mg Oral Daily   Continuous Infusions:  amiodarone 30 mg/hr (09/12/22 0831)   PRN Meds: acetaminophen, guaiFENesin-dextromethorphan, levalbuterol, ondansetron (ZOFRAN) IV, phenol   Vital Signs    Vitals:   09/12/22 0434 09/12/22 0634 09/12/22 0741 09/12/22 0829  BP: (!) 136/121 133/83 (!) 103/91 (!) 109/95  Pulse: 65  (!) 118 (!) 115  Resp: 20  19   Temp: 99 F (37.2 C)  98.7 F (37.1 C)   TempSrc: Oral  Oral   SpO2: 94%   95%  Weight:      Height:        Intake/Output Summary (Last 24 hours) at 09/12/2022 0858 Last data filed at 09/12/2022 0700 Gross per 24 hour  Intake 435.57 ml  Output 400 ml  Net 35.57 ml      09/11/2022    1:23 PM 09/02/2022   11:05 AM 08/14/2022   11:00 AM  Last 3 Weights  Weight (lbs) 193 lb 193 lb 195 lb  Weight (kg) 87.544 kg 87.544 kg 88.451 kg      Telemetry    Atrial fibrillation heart rate in the 130s- Personally Reviewed  ECG    On admission atrial fibrillation 107- Personally Reviewed  Physical Exam   GEN: No acute distress.   Neck: No JVD Cardiac: Irregularly irregular, no murmurs, rubs, or gallops.  Respiratory: Wheeze bilaterally. GI: Soft, nontender, non-distended  MS: No edema; No deformity. Neuro:  Nonfocal  Psych: Normal affect   Labs    High Sensitivity Troponin:   Recent Labs  Lab 09/11/22 1335 09/11/22 1529  TROPONINIHS 5 5     Chemistry Recent Labs  Lab  09/11/22 1335 09/12/22 0142  NA 137 138  K 4.3 3.7  CL 101 102  CO2 28 26  GLUCOSE 99 108*  BUN 12 13  CREATININE 0.95 0.95  CALCIUM 8.8* 8.5*  GFRNONAA >60 >60  ANIONGAP 8 10    Lipids No results for input(s): "CHOL", "TRIG", "HDL", "LABVLDL", "LDLCALC", "CHOLHDL" in the last 168 hours.  Hematology Recent Labs  Lab 09/11/22 1335 09/12/22 0142  WBC 10.4 10.9*  RBC 4.77 4.65  HGB 14.7 14.2  HCT 44.7 43.7  MCV 93.7 94.0  MCH 30.8 30.5  MCHC 32.9 32.5  RDW 13.0 13.1  PLT 188 190   Thyroid No results for input(s): "TSH", "FREET4" in the last 168 hours.  BNP Recent Labs  Lab 09/11/22 1632  BNP 225.9*    DDimer No results for input(s): "DDIMER" in the last 168 hours.   Radiology    DG Chest 2 View  Result Date: 09/11/2022 CLINICAL DATA:  Palpitations in a 61 year old male. EXAM: CHEST - 2 VIEW COMPARISON:  May 25, 2022 FINDINGS: Cardiomediastinal contours and hilar structures are stable. Mild increased interstitial markings are present in the chest. Elevated RIGHT  hemidiaphragm with juxta diaphragmatic atelectasis. No lobar consolidation. No sign of pneumothorax. On limited assessment no acute skeletal process. IMPRESSION: 1. Mild increased interstitial markings in the chest may represent mild edema or atypical infection. 2. Elevated RIGHT hemidiaphragm with juxta diaphragmatic atelectasis. Electronically Signed   By: Zetta Bills M.D.   On: 09/11/2022 13:55    Cardiac Studies    ECHO 11/2010  EF 60% and mod LA dilatation  Patient Profile     61 y.o. male persistent lesion with rapid ventricular response.  Sees Dr. Lovena Le.  Assessment & Plan    Persistent atrial fibrillation - Previous visits with Dr. Lovena Le, decided on rate control.  Beta-blocker was increased and amlodipine was stopped.  PVCs been fairly reasonably controlled.  No known coronary artery disease but no prior stress test or catheterization. -[facility] and was found to be in A-fib RVR rate 160.  Was given IV diltiazem 20 mg by EMS. -Since rates were very challenging to control even up to the 170s, IV amiodarone was started.  It was felt that there was not much of a response to metoprolol and diltiazem.  Now on metoprolol tartrate 25 3 times daily blood pressure 109/95.  Will increase. Has an underlying viral illness. -Can consider DC cardioversion as patient has not missed any doses of his Xarelto.  -As outpatient, could consider ablation strategy.  Previously discussed on 08/14/2022 appointment with Dr. Lovena Le.  Chronic anticoagulation - Continue with Xarelto  History of Graves' disease - Prior TSH on 12/18 was 0.26 and free T4 was 1.39.  Hyperlipidemia - On simvastatin 40 mg.  Since he is now on amiodarone, we will change this over to Crestor 20 mg a day.    For questions or updates, please contact [place] Please consult [email or web address] for contact info under        Signed, Candee Furbish, MD  09/12/2022, 8:58 AM

## 2022-09-13 DIAGNOSIS — I480 Paroxysmal atrial fibrillation: Secondary | ICD-10-CM | POA: Diagnosis not present

## 2022-09-13 LAB — CBC WITH DIFFERENTIAL/PLATELET
Abs Immature Granulocytes: 0.04 10*3/uL (ref 0.00–0.07)
Basophils Absolute: 0 10*3/uL (ref 0.0–0.1)
Basophils Relative: 0 %
Eosinophils Absolute: 0.1 10*3/uL (ref 0.0–0.5)
Eosinophils Relative: 1 %
HCT: 42.6 % (ref 39.0–52.0)
Hemoglobin: 14.1 g/dL (ref 13.0–17.0)
Immature Granulocytes: 0 %
Lymphocytes Relative: 24 %
Lymphs Abs: 3 10*3/uL (ref 0.7–4.0)
MCH: 30.7 pg (ref 26.0–34.0)
MCHC: 33.1 g/dL (ref 30.0–36.0)
MCV: 92.8 fL (ref 80.0–100.0)
Monocytes Absolute: 1.8 10*3/uL — ABNORMAL HIGH (ref 0.1–1.0)
Monocytes Relative: 15 %
Neutro Abs: 7.4 10*3/uL (ref 1.7–7.7)
Neutrophils Relative %: 60 %
Platelets: 210 10*3/uL (ref 150–400)
RBC: 4.59 MIL/uL (ref 4.22–5.81)
RDW: 13 % (ref 11.5–15.5)
WBC: 12.4 10*3/uL — ABNORMAL HIGH (ref 4.0–10.5)
nRBC: 0 % (ref 0.0–0.2)

## 2022-09-13 LAB — RESPIRATORY PANEL BY PCR

## 2022-09-13 LAB — COMPREHENSIVE METABOLIC PANEL
ALT: 35 U/L (ref 0–44)
AST: 29 U/L (ref 15–41)
Albumin: 3.4 g/dL — ABNORMAL LOW (ref 3.5–5.0)
Alkaline Phosphatase: 93 U/L (ref 38–126)
Anion gap: 10 (ref 5–15)
BUN: 9 mg/dL (ref 8–23)
CO2: 25 mmol/L (ref 22–32)
Calcium: 8.5 mg/dL — ABNORMAL LOW (ref 8.9–10.3)
Chloride: 97 mmol/L — ABNORMAL LOW (ref 98–111)
Creatinine, Ser: 0.9 mg/dL (ref 0.61–1.24)
GFR, Estimated: >60 mL/min (ref >60)
Glucose, Bld: 118 mg/dL — ABNORMAL HIGH (ref 70–99)
Potassium: 3.6 mmol/L (ref 3.5–5.1)
Sodium: 132 mmol/L — ABNORMAL LOW (ref 135–145)
Total Bilirubin: 0.7 mg/dL (ref 0.3–1.2)
Total Protein: 6 g/dL — ABNORMAL LOW (ref 6.5–8.1)

## 2022-09-13 LAB — MAGNESIUM: Magnesium: 1.7 mg/dL (ref 1.7–2.4)

## 2022-09-13 LAB — PHOSPHORUS: Phosphorus: 4 mg/dL (ref 2.5–4.6)

## 2022-09-13 MED ORDER — GUAIFENESIN ER 600 MG PO TB12
600.0000 mg | ORAL_TABLET | Freq: Two times a day (BID) | ORAL | Status: DC
  Administered 2022-09-13 – 2022-09-15 (×4): 600 mg via ORAL
  Filled 2022-09-13 (×4): qty 1

## 2022-09-13 MED ORDER — IPRATROPIUM-ALBUTEROL 0.5-2.5 (3) MG/3ML IN SOLN
3.0000 mL | RESPIRATORY_TRACT | Status: AC
  Administered 2022-09-13: 3 mL via RESPIRATORY_TRACT
  Filled 2022-09-13: qty 3

## 2022-09-13 MED ORDER — ONDANSETRON 4 MG PO TBDP
4.0000 mg | ORAL_TABLET | Freq: Three times a day (TID) | ORAL | Status: DC | PRN
  Administered 2022-09-13: 4 mg via ORAL
  Filled 2022-09-13 (×2): qty 1

## 2022-09-13 MED ORDER — METOPROLOL TARTRATE 50 MG PO TABS
75.0000 mg | ORAL_TABLET | Freq: Three times a day (TID) | ORAL | Status: DC
  Administered 2022-09-13 – 2022-09-15 (×7): 75 mg via ORAL
  Filled 2022-09-13 (×7): qty 1

## 2022-09-13 NOTE — Progress Notes (Signed)
Rounding Note    Patient Name: Jesse HEMMER [name] Date of Encounter: 09/13/2022  [facility] Cardiologist: Cristopher Peru, MD   Subjective   Still having trouble with rate control of his atrial fibrillation.Marland Kitchen  No chest pain. SOB with exertion  Inpatient Medications    Scheduled Meds:  levothyroxine  200 mcg Oral Once per day on Mon Tue Wed Thu Fri Sat   And   levothyroxine  100 mcg Oral Q Sun   metoprolol tartrate  50 mg Oral TID   montelukast  10 mg Oral QHS   ofloxacin  1 drop Right Eye QID   pantoprazole  40 mg Oral Daily   prednisoLONE acetate  1 drop Right Eye QID   rivaroxaban  20 mg Oral Q supper   rosuvastatin  20 mg Oral Daily   sertraline  50 mg Oral Daily   Continuous Infusions:  amiodarone 30 mg/hr (09/13/22 0716)   PRN Meds: acetaminophen, clonazePAM, guaiFENesin-dextromethorphan, levalbuterol, ondansetron (ZOFRAN) IV, phenol   Vital Signs    Vitals:   09/12/22 2033 09/13/22 0044 09/13/22 0451 09/13/22 0821  BP: (!) 142/130 (!) 114/99 135/83 (!) 141/95  Pulse: (!) 135 70 (!) 111 (!) 137  Resp: '17 18 17 19  '$ Temp: 98.3 F (36.8 C) 98.1 F (36.7 C) 98.7 F (37.1 C) 98.1 F (36.7 C)  TempSrc:  Oral Oral Oral  SpO2: 94% 95% 96% 93%  Weight:      Height:        Intake/Output Summary (Last 24 hours) at 09/13/2022 0847 Last data filed at 09/13/2022 0533 Gross per 24 hour  Intake 400.54 ml  Output 450 ml  Net -49.46 ml      09/11/2022    1:23 PM 09/02/2022   11:05 AM 08/14/2022   11:00 AM  Last 3 Weights  Weight (lbs) 193 lb 193 lb 195 lb  Weight (kg) 87.544 kg 87.544 kg 88.451 kg      Telemetry    Atrial fibrillation heart rate in the 100-150s- Personally Reviewed  ECG    On admission atrial fibrillation 107- Personally Reviewed  Physical Exam   GEN: No acute distress.   Neck: No JVD Cardiac: Irregularly irregular, tachycardic no murmurs, rubs, or gallops.  Respiratory: Ongoing wheeze bilaterally. GI: Soft,  nontender, non-distended  MS: No edema; No deformity. Neuro:  Nonfocal  Psych: Normal affect   Labs    High Sensitivity Troponin:   Recent Labs  Lab 09/11/22 1335 09/11/22 1529  TROPONINIHS 5 5     Chemistry Recent Labs  Lab 09/11/22 1335 09/12/22 0142 09/13/22 0118  NA 137 138 132*  K 4.3 3.7 3.6  CL 101 102 97*  CO2 '28 26 25  '$ GLUCOSE 99 108* 118*  BUN '12 13 9  '$ CREATININE 0.95 0.95 0.90  CALCIUM 8.8* 8.5* 8.5*  MG  --   --  1.7  PROT  --   --  6.0*  ALBUMIN  --   --  3.4*  AST  --   --  29  ALT  --   --  35  ALKPHOS  --   --  93  BILITOT  --   --  0.7  GFRNONAA >60 >60 >60  ANIONGAP '8 10 10    '$ Lipids No results for input(s): "CHOL", "TRIG", "HDL", "LABVLDL", "LDLCALC", "CHOLHDL" in the last 168 hours.  Hematology Recent Labs  Lab 09/11/22 1335 09/12/22 0142 09/13/22 0118  WBC 10.4 10.9* 12.4*  RBC 4.77 4.65  4.59  HGB 14.7 14.2 14.1  HCT 44.7 43.7 42.6  MCV 93.7 94.0 92.8  MCH 30.8 30.5 30.7  MCHC 32.9 32.5 33.1  RDW 13.0 13.1 13.0  PLT 188 190 210   Thyroid No results for input(s): "TSH", "FREET4" in the last 168 hours.  BNP Recent Labs  Lab 09/11/22 1632  BNP 225.9*    DDimer No results for input(s): "DDIMER" in the last 168 hours.   Radiology    ECHOCARDIOGRAM COMPLETE  Result Date: 09/12/2022    ECHOCARDIOGRAM REPORT   Patient Name:   JAKUB DEBOLD [name] Date of Exam: 09/12/2022 Medical Rec #:  [id number]            Height:       69.0 in Accession #:    [id number]           Weight:       193.0 lb Date of Birth:  10/13/1960            BSA:          2.035 m Patient Age:    2 years             BP:           119/92 mmHg Patient Gender: M                    HR:           130 bpm. Exam Location:  Inpatient Procedure: 2D Echo, Cardiac Doppler and Color Doppler Indications:    Atrial Fibrillation  History:        Patient has no prior history of Echocardiogram examinations.                 Diastolic HF, Arrythmias:Atrial Fibrillation; Risk                  Factors:Hypertension. Graves disease.  Sonographer:    Eartha Inch Referring Phys: Gwyndolyn Kaufman, E  Sonographer Comments: Image acquisition challenging due to patient body habitus. IMPRESSIONS  1. Left ventricular ejection fraction, by estimation, is 40 to 45%. The left ventricle has mildly decreased function. The left ventricle demonstrates global hypokinesis. There is mild concentric left ventricular hypertrophy. Left ventricular diastolic function could not be evaluated.  2. Right ventricular systolic function is normal. The right ventricular size is mildly enlarged. There is normal pulmonary artery systolic pressure. The estimated right ventricular systolic pressure is 15.7 mmHg.  3. Left atrial size was moderately dilated.  4. Right atrial size was moderately dilated.  5. The mitral valve is normal in structure. Mild mitral valve regurgitation.  6. The aortic valve is tricuspid. Aortic valve regurgitation is not visualized. No aortic stenosis is present.  7. The inferior vena cava is normal in size with greater than 50% respiratory variability, suggesting right atrial pressure of 3 mmHg. FINDINGS  Left Ventricle: Left ventricular ejection fraction, by estimation, is 40 to 45%. The left ventricle has mildly decreased function. The left ventricle demonstrates global hypokinesis. The left ventricular internal cavity size was normal in size. There is  mild concentric left ventricular hypertrophy. Left ventricular diastolic function could not be evaluated due to atrial fibrillation. Left ventricular diastolic function could not be evaluated. Right Ventricle: The right ventricular size is mildly enlarged. No increase in right ventricular wall thickness. Right ventricular systolic function is normal. There is normal pulmonary artery systolic pressure. The tricuspid regurgitant velocity is 2.14  m/s, and with an assumed right atrial  pressure of 3 mmHg, the estimated right ventricular systolic pressure is  32.9 mmHg. Left Atrium: Left atrial size was moderately dilated. Right Atrium: Right atrial size was moderately dilated. Pericardium: There is no evidence of pericardial effusion. Mitral Valve: The mitral valve is normal in structure. Mild mitral valve regurgitation. Tricuspid Valve: The tricuspid valve is normal in structure. Tricuspid valve regurgitation is trivial. Aortic Valve: The aortic valve is tricuspid. Aortic valve regurgitation is not visualized. No aortic stenosis is present. Pulmonic Valve: The pulmonic valve was normal in structure. Pulmonic valve regurgitation is not visualized. Aorta: The aortic root and ascending aorta are structurally normal, with no evidence of dilitation. Venous: The inferior vena cava is normal in size with greater than 50% respiratory variability, suggesting right atrial pressure of 3 mmHg. IAS/Shunts: The interatrial septum was not well visualized.  LEFT VENTRICLE PLAX 2D LVIDd:         4.50 cm LVIDs:         3.58 cm LV PW:         1.30 cm LV IVS:        1.30 cm  LEFT ATRIUM         Index LA diam:    4.36 cm 2.14 cm/m  TRICUSPID VALVE TR Peak grad:   18.3 mmHg TR Vmax:        214.00 cm/s Sanda Klein MD Electronically signed by Sanda Klein MD Signature Date/Time: 09/12/2022/3:02:12 PM    Final    DG Chest 2 View  Result Date: 09/11/2022 CLINICAL DATA:  Palpitations in a 61 year old male. EXAM: CHEST - 2 VIEW COMPARISON:  May 25, 2022 FINDINGS: Cardiomediastinal contours and hilar structures are stable. Mild increased interstitial markings are present in the chest. Elevated RIGHT hemidiaphragm with juxta diaphragmatic atelectasis. No lobar consolidation. No sign of pneumothorax. On limited assessment no acute skeletal process. IMPRESSION: 1. Mild increased interstitial markings in the chest may represent mild edema or atypical infection. 2. Elevated RIGHT hemidiaphragm with juxta diaphragmatic atelectasis. Electronically Signed   By: Zetta Bills M.D.   On:  09/11/2022 13:55    Cardiac Studies    ECHO 11/2010  EF 60% and mod LA dilatation  Patient Profile     61 y.o. male persistent lesion with rapid ventricular response.  Sees Dr. Lovena Le.  Assessment & Plan    Persistent atrial fibrillation - Previous visits with Dr. Lovena Le, decided on rate control.  Beta-blocker was increased and amlodipine was stopped.  PVCs been fairly reasonably controlled.  No known coronary artery disease but no prior stress test or catheterization. -[facility] and was found to be in A-fib RVR rate 160.  Was given IV diltiazem 20 mg by EMS. -Since rates were very challenging to control even up to the 170s, IV amiodarone was started.  It was felt that there was not much of a response to metoprolol and diltiazem.  We will go ahead and increase his metoprolol to 75 mg 3 times a day to assist with rate control as well.  Has an underlying viral illness.  As illness improves, heart rate should improve as well. -Can consider DC cardioversion as patient has not missed any doses of his Xarelto.  This could potentially be done on Tuesday if there is space available.  On Monday, consider making n.p.o. past midnight just in case. -As outpatient, could consider ablation strategy.  Previously discussed on 08/14/2022 appointment with Dr. Lovena Le.  Chronic anticoagulation - Continue with Xarelto  History of Graves' disease - Prior TSH on 12/18 was 0.26 and free T4 was 1.39.  Hyperlipidemia - On simvastatin 40 mg.  Since he is now on amiodarone, we will change this over to Crestor 20 mg a day.    For questions or updates, please contact [place] Please consult [email or web address] for contact info under        Signed, Candee Furbish, MD  09/13/2022, 8:47 AM

## 2022-09-13 NOTE — Progress Notes (Signed)
**Note De-Identified vi Obfusction** PROGRESS NOTE    BECKHM CPISTRN [name]  WYO:[id number] DOB: [birth year] DO: 09/11/2022 PCP: Pcp, No  Chief Complint  Ptient presents with    FIB RVR    Brief Nrrtive:  Jesse Europe [name] is  61 y.o. mle with medicl history significnt of grve's disese s/p rdioctive iodine bltion, HTN, PF.   Pt previously on rhythm control therpy, but fter this didn't work crds went bck to rte control therpy.   Tody pt [facility] nd found to be in  fib with RVR, rte 160. No chest pin or SOB. He ws given 20 mg dilt by EMS nd 500 CC LR.   HR improved some, given IV BB in ED nd tht seemed to just drop his BP.   Crds consult in ED: they put pt on PO BB + miodrone gtt.   Pt nd whole fmily sick with URI symptoms pst 2 dys.  Cough nd congestion.   Tking ll meds t home including Xrelto.  ssessment & Pln:   Principl Problem:   Proxysml tril fibrilltion with RVR (HCC) ctive Problems:   URI (upper respirtory infection)   Hypothyroidism following rdioiodine therpy   Essentil hypertension   Dyslipidemi   tril fibrilltion with RVR (HCC)  tril Fibrilltion with RVR pprecite crds ssistnce - currently on miodrone, metoprolol.  Consider DC crdioversion (pt hs not missed doses of xrelto) vs bltion outptient. Echo with EF 40-45% mio gtt Metoprolol 75 mg TID Xrelto  HFmrEF pprecite crds recs Currently ppers euvolemic  Rhinovirus Infection CXR with mild edem vs typicl infection Negtive covid, influenz, RSV RVP with rhino/enterovirus  Hypertension Lisinopril on hold Now on metoprolol Continue clonidine ptch   Dyslipidemi Sttin   Hypothyroidism TSH 12/18 ws suppressed t 0.26 Dr. Dwyne Dee recommended 200 mcg synthroid dily with 1/2 pill on Sundys Outptient f/u with endocrine  Recent Ctrct Surgery Prednisolone, ofloxcin    DVT prophylxis:  xrelto Code Sttus: full Fmily Communiction: none Disposition:   Sttus is: Inptient Remins inptient pproprite becuse: continued need for mngement of rvr   Consultnts:  Crdiology  Procedures:  Echo IMPRESSIONS     1. Left ventriculr ejection frction, by estimtion, is 40 to 45%. The  left ventricle hs mildly decresed function. The left ventricle  demonstrtes globl hypokinesis. There is mild concentric left ventriculr  hypertrophy. Left ventriculr distolic  function could not be evluted.   2. Right ventriculr systolic function is norml. The right ventriculr  size is mildly enlrged. There is norml pulmonry rtery systolic  pressure. The estimted right ventriculr systolic pressure is 77.4 mmHg.   3. Left tril size ws modertely dilted.   4. Right tril size ws modertely dilted.   5. The mitrl vlve is norml in structure. Mild mitrl vlve  regurgittion.   6. The ortic vlve is tricuspid. ortic vlve regurgittion is not  visulized. No ortic stenosis is present.   7. The inferior ven cv is norml in size with greter thn 50%  respirtory vribility, suggesting right tril pressure of 3 mmHg.   ntimicrobils:  nti-infectives (From dmission, onwrd)    None       Subjective: Sys he's bored, frustrted due to this  Objective: Vitls:   09/12/22 0634 09/12/22 0741 09/12/22 0829 09/12/22 1651  BP: 133/83 (!) 103/91 (!) 109/95 (!) 135/103  Pulse:  (!) 118 (!) 115 (!) 124  Resp:  19  19  Temp:  98.7 F (37.1 C) **Note De-Identified vi Obfusction** 98 F (36.7 C)  TempSrc:  Orl  Orl  SpO2:   95% 96%  Weight:      Height:        Intke/Output Summry (Lst 24 hours) t 09/12/2022 1741 Lst dt filed t 09/12/2022 0700 Gross per 24 hour  Intke 435.57 ml  Output 400 ml  Net 35.57 ml   Filed Weights   09/11/22 1323  Weight: 87.5 kg    Exmintion:  Generl: No cute distress. Crdiovsculr: irregulrly irregulr, tchycrdic Lungs:  unlbored Neurologicl: lert nd oriented 3. Moves ll extremities 4 with equl strength. Crnil nerves II through XII grossly intct. Extremities: No clubbing or cynosis. No edem.   Dt Reviewed: I hve personlly reviewed following lbs nd imging studies  CBC: Recent Lbs  Lb 09/11/22 1335 09/12/22 0142  WBC 10.4 10.9*  HGB 14.7 14.2  HCT 44.7 43.7  MCV 93.7 94.0  PLT 188 627    Bsic Metbolic Pnel: Recent Lbs  Lb 09/11/22 1335 09/12/22 0142  N 137 138  K 4.3 3.7  CL 101 102  CO2 28 26  GLUCOSE 99 108*  BUN 12 13  CRETININE 0.95 0.95  CLCIUM 8.8* 8.5*    GFR: Estimted Cretinine Clernce: 87.1 mL/min (by [place] formul bsed on SCr of 0.95 mg/dL).  Liver Function Tests: No results for input(s): "ST", "LT", "LKPHOS", "BILITOT", "PROT", "LBUMIN" in the lst 168 hours.  CBG: No results for input(s): "GLUCP" in the lst 168 hours.   Recent Results (from the pst 240 hour(s))  Resp pnel by RT-PCR (RSV, Flu &B, Covid) nterior Nsl Swb     Sttus: None   Collection Time: 09/11/22  4:32 PM   Specimen: nterior Nsl Swb  Result Vlue Ref Rnge Sttus   SRS Coronvirus 2 by RT PCR NEGTIVE NEGTIVE Finl    Comment: (NOTE) SRS-CoV-2 trget nucleic cids re NOT DETECTED.  The SRS-CoV-2 RN is generlly detectble in upper respirtory specimens during the cute phse of infection. The lowest concentrtion of SRS-CoV-2 virl copies this ssy cn detect is 138 copies/mL.  negtive result does not preclude SRS-Cov-2 infection nd should not be used s the sole bsis for tretment or other ptient mngement decisions.  negtive result my occur with  improper specimen collection/hndling, submission of specimen other thn nsophryngel swb, presence of virl muttion(s) within the res trgeted by this ssy, nd indequte number of virl copies(<138 copies/mL).  negtive result must be combined with clinicl observtions,  ptient history, nd epidemiologicl informtion. The expected result is Negtive.  Fct Sheet for Ptients:  EntrepreneurPulse.com.u  Fct Sheet for [place] Providers:  IncredibleEmployment.be  This test is no t yet pproved or clered by the [place] FD nd  hs been uthorized for detection nd/or dignosis of SRS-CoV-2 by FD under n Emergency Use uthoriztion (EU). This EU will remin  in effect (mening this test cn be used) for the durtion of the COVID-19 declrtion under Section 564(b)(1) of the ct, 21 U.S.C.section 360bbb-3(b)(1), unless the uthoriztion is terminted  or revoked sooner.       Influenz  by PCR NEGTIVE NEGTIVE Finl   Influenz B by PCR NEGTIVE NEGTIVE Finl    Comment: (NOTE) The Xpert Xpress SRS-CoV-2/FLU/RSV plus ssy is intended s n id in the dignosis of influenz from Nsophryngel swb specimens nd should not be used s  sole bsis for tretment. Nsl wshings nd spirtes re uncceptble for Xpert Xpress SRS-CoV-2/FLU/RSV testing.  Fct Sheet for Ptients: EntrepreneurPulse.com.u  Fct Sheet for [place]  Providers: IncredibleEmployment.be  This test is not yet approved or cleared by the [place] and has been authorized for detection and/or diagnosis of SARS-CoV-2 by FDA under an Emergency Use Authorization (EUA). This EUA will remain in effect (meaning this test can be used) for the duration of the COVID-19 declaration under Section 564(b)(1) of the Act, 21 U.S.C. section 360bbb-3(b)(1), unless the authorization is terminated or revoked.     Resp Syncytial Virus by PCR NEGATIVE NEGATIVE Final    Comment: (NOTE) Fact Sheet for Patients: EntrepreneurPulse.com.au  Fact Sheet for Healthcare Providers: IncredibleEmployment.be  This test is not yet approved or cleared by the [place] FDA and has been authorized for detection and/or diagnosis of SARS-CoV-2 by FDA under an Emergency Use Authorization (EUA). This EUA will remain in effect (meaning this test can be used) for the duration of the COVID-19 declaration under Section 564(b)(1) of the Act, 21 U.S.C. section 360bbb-3(b)(1), unless the authorization is terminated or revoked.  Performed at [facility], [place] [address] [place], [place] [address]          Radiology Studies: ECHOCARDIOGRAM COMPLETE  Result Date: 09/12/2022    ECHOCARDIOGRAM REPORT   Patient Name:   Jesse HURTA [name] Date of Exam: 09/12/2022 Medical Rec #:  [id number]            Height:       69.0 in Accession #:    [id number]           Weight:       193.0 lb Date of Birth:  1960-09-23            BSA:          2.035 m Patient Age:    51 years             BP:           119/92 mmHg Patient Gender: M                    HR:           130 bpm. Exam Location:  Inpatient Procedure: 2D Echo, Cardiac Doppler and Color Doppler Indications:    Atrial Fibrillation  History:        Patient has no prior history of Echocardiogram examinations.                 Diastolic HF, Arrythmias:Atrial Fibrillation; Risk                 Factors:Hypertension. Graves disease.  Sonographer:    Eartha Inch Referring Phys: Gwyndolyn Kaufman, E  Sonographer Comments: Image acquisition challenging due to patient body habitus. IMPRESSIONS  1. Left ventricular ejection fraction, by estimation, is 40 to 45%. The left ventricle has mildly decreased function. The left ventricle demonstrates global hypokinesis. There is mild concentric left ventricular hypertrophy. Left ventricular diastolic function could not be evaluated.  2. Right ventricular systolic function is normal. The right ventricular size is mildly enlarged. There is normal pulmonary artery systolic pressure. The estimated right ventricular systolic pressure is 79.0 mmHg.  3. Left atrial size was moderately  dilated.  4. Right atrial size was moderately dilated.  5. The mitral valve is normal in structure. Mild mitral valve regurgitation.  6. The aortic valve is tricuspid. Aortic valve regurgitation is not visualized. No aortic stenosis is present.  7. The inferior vena cava is normal in size with greater than 50% respiratory variability, suggesting right atrial **Note De-Identified vi Obfusction** pressure of 3 mmHg. FINDINGS  Left Ventricle: Left ventriculr ejection frction, by estimtion, is 40 to 45%. The left ventricle hs mildly decresed function. The left ventricle demonstrtes globl hypokinesis. The left ventriculr internl cvity size ws norml in size. There is  mild concentric left ventriculr hypertrophy. Left ventriculr distolic function could not be evluted due to tril fibrilltion. Left ventriculr distolic function could not be evluted. Right Ventricle: The right ventriculr size is mildly enlrged. No increse in right ventriculr wll thickness. Right ventriculr systolic function is norml. There is norml pulmonry rtery systolic pressure. The tricuspid regurgitnt velocity is 2.14  m/s, nd with n ssumed right tril pressure of 3 mmHg, the estimted right ventriculr systolic pressure is 69.6 mmHg. Left Atrium: Left tril size ws modertely dilted. Right Atrium: Right tril size ws modertely dilted. Pericrdium: There is no evidence of pericrdil effusion. Mitrl Vlve: The mitrl vlve is norml in structure. Mild mitrl vlve regurgittion. Tricuspid Vlve: The tricuspid vlve is norml in structure. Tricuspid vlve regurgittion is trivil. Aortic Vlve: The ortic vlve is tricuspid. Aortic vlve regurgittion is not visulized. No ortic stenosis is present. Pulmonic Vlve: The pulmonic vlve ws norml in structure. Pulmonic vlve regurgittion is not visulized. Aort: The ortic root nd scending ort re structurlly norml, with no evidence of dilittion. Venous: The inferior ven cv is norml  in size with greter thn 50% respirtory vribility, suggesting right tril pressure of 3 mmHg. IAS/Shunts: The intertril septum ws not well visulized.  LEFT VENTRICLE PLAX 2D LVIDd:         4.50 cm LVIDs:         3.58 cm LV PW:         1.30 cm LV IVS:        1.30 cm  LEFT ATRIUM         Index LA dim:    4.36 cm 2.14 cm/m  TRICUSPID VALVE TR Pek grd:   18.3 mmHg TR Vmx:        214.00 cm/s Snd Klein MD Electroniclly signed by Snd Klein MD Signture Dte/Time: 09/12/2022/3:02:12 PM    Finl    DG Chest 2 View  Result Dte: 09/11/2022 CLINICAL DATA:  Plpittions in  14 yer old mle. EXAM: CHEST - 2 VIEW COMPARISON:  May 25, 2022 FINDINGS: Crdiomedistinl contours nd hilr structures re stble. Mild incresed interstitil mrkings re present in the chest. Elevted RIGHT hemidiphrgm with juxt diphrgmtic telectsis. No lobr consolidtion. No sign of pneumothorx. On limited ssessment no cute skeletl process. IMPRESSION: 1. Mild incresed interstitil mrkings in the chest my represent mild edem or typicl infection. 2. Elevted RIGHT hemidiphrgm with juxt diphrgmtic telectsis. Electroniclly Signed   By: Zett Bills M.D.   On: 09/11/2022 13:55        Scheduled Meds:  levothyroxine  200 mcg Orl Once per dy on Mon Tue Wed Thu Fri St   And   [START ON 09/13/2022] levothyroxine  100 mcg Orl Q Sun   metoprolol trtrte  50 mg Orl TID   pntoprzole  40 mg Orl Dily   rivroxbn  20 mg Orl Q supper   [START ON 09/13/2022] rosuvsttin  20 mg Orl Dily   sertrline  50 mg Orl Dily   Continuous Infusions:  miodrone 30 mg/hr (09/12/22 0831)     LOS: 0 dys    Time spent: over 30 min    Fyrene Helper, MD [facility]   To contct the ttending provider between 7A-7P or the covering provider  during after hours 7P-7A, please log into the web site [email or web address] and access using universal  password for that  web site. If you do not have the password, please call the hospital operator.  09/12/2022, 5:41 PM

## 2022-09-14 DIAGNOSIS — I480 Paroxysmal atrial fibrillation: Secondary | ICD-10-CM | POA: Diagnosis not present

## 2022-09-14 LAB — CBC
HCT: 42.3 % (ref 39.0–52.0)
Hemoglobin: 14.6 g/dL (ref 13.0–17.0)
MCH: 31.1 pg (ref 26.0–34.0)
MCHC: 34.5 g/dL (ref 30.0–36.0)
MCV: 90.2 fL (ref 80.0–100.0)
Platelets: 201 10*3/uL (ref 150–400)
RBC: 4.69 MIL/uL (ref 4.22–5.81)
RDW: 12.7 % (ref 11.5–15.5)
WBC: 15.8 10*3/uL — ABNORMAL HIGH (ref 4.0–10.5)
nRBC: 0 % (ref 0.0–0.2)

## 2022-09-14 LAB — PHOSPHORUS: Phosphorus: 4 mg/dL (ref 2.5–4.6)

## 2022-09-14 LAB — PROTIME-INR
INR: 1.9 — ABNORMAL HIGH (ref 0.8–1.2)
Prothrombin Time: 21.3 seconds — ABNORMAL HIGH (ref 11.4–15.2)

## 2022-09-14 LAB — BASIC METABOLIC PANEL
Anion gap: 10 (ref 5–15)
BUN: 6 mg/dL — ABNORMAL LOW (ref 8–23)
CO2: 25 mmol/L (ref 22–32)
Calcium: 8.7 mg/dL — ABNORMAL LOW (ref 8.9–10.3)
Chloride: 96 mmol/L — ABNORMAL LOW (ref 98–111)
Creatinine, Ser: 0.86 mg/dL (ref 0.61–1.24)
GFR, Estimated: >60 mL/min (ref >60)
Glucose, Bld: 115 mg/dL — ABNORMAL HIGH (ref 70–99)
Potassium: 4.3 mmol/L (ref 3.5–5.1)
Sodium: 131 mmol/L — ABNORMAL LOW (ref 135–145)

## 2022-09-14 LAB — MAGNESIUM: Magnesium: 1.7 mg/dL (ref 1.7–2.4)

## 2022-09-14 MED ORDER — PROMETHAZINE HCL 25 MG PO TABS
25.0000 mg | ORAL_TABLET | Freq: Four times a day (QID) | ORAL | Status: DC | PRN
  Administered 2022-09-14: 25 mg via ORAL
  Filled 2022-09-14 (×2): qty 1

## 2022-09-14 MED ORDER — PROMETHAZINE HCL 25 MG RE SUPP: 25.0000 mg | Freq: Four times a day (QID) | RECTAL | Status: DC | PRN

## 2022-09-14 MED ORDER — SODIUM CHLORIDE 0.9 % IV SOLN: INTRAVENOUS | Status: DC

## 2022-09-14 MED ORDER — TRAZODONE HCL 50 MG PO TABS
50.0000 mg | ORAL_TABLET | Freq: Every day | ORAL | Status: DC
  Administered 2022-09-14: 50 mg via ORAL
  Filled 2022-09-14: qty 1

## 2022-09-14 MED ORDER — SODIUM CHLORIDE 0.9 % IV SOLN: 6.2500 mg | Freq: Four times a day (QID) | INTRAVENOUS | Status: DC | PRN

## 2022-09-14 NOTE — Progress Notes (Signed)
Rounding Note    Patient Name: Jesse DAVIDIAN [name] Date of Encounter: 09/14/2022  [place] Cardiologist: Cristopher Peru, MD   Subjective   Still having trouble with rate control of his atrial fibrillation despite higher metoprolol and amio IV load. No chest pain. SOB with exertion  Inpatient Medications    Scheduled Meds:  guaiFENesin  600 mg Oral BID   levothyroxine  200 mcg Oral Once per day on Mon Tue Wed Thu Fri Sat   And   levothyroxine  100 mcg Oral Q Sun   metoprolol tartrate  75 mg Oral TID   montelukast  10 mg Oral QHS   ofloxacin  1 drop Right Eye QID   pantoprazole  40 mg Oral Daily   prednisoLONE acetate  1 drop Right Eye QID   rivaroxaban  20 mg Oral Q supper   rosuvastatin  20 mg Oral Daily   sertraline  50 mg Oral Daily   Continuous Infusions:  amiodarone 30 mg/hr (09/14/22 0709)   PRN Meds: acetaminophen, clonazePAM, guaiFENesin-dextromethorphan, levalbuterol, ondansetron (ZOFRAN) IV, ondansetron, phenol   Vital Signs    Vitals:   09/13/22 1628 09/13/22 2015 09/13/22 2017 09/14/22 0423  BP: (!) 139/103 (!) 119/98 (!) 119/98 (!) 126/90  Pulse: (!) 101 (!) 103 (!) 131 (!) 126  Resp: '19  18 18  '$ Temp: 97.9 F (36.6 C)  98.1 F (36.7 C) 98.3 F (36.8 C)  TempSrc: Oral  Oral Oral  SpO2: 98%  94% 95%  Weight:      Height:        Intake/Output Summary (Last 24 hours) at 09/14/2022 0831 Last data filed at 09/14/2022 0425 Gross per 24 hour  Intake --  Output 1800 ml  Net -1800 ml      09/11/2022    1:23 PM 09/02/2022   11:05 AM 08/14/2022   11:00 AM  Last 3 Weights  Weight (lbs) 193 lb 193 lb 195 lb  Weight (kg) 87.544 kg 87.544 kg 88.451 kg      Telemetry    Atrial fibrillation heart rate in the 100-150s- Personally Reviewed  ECG    On admission atrial fibrillation 107- Personally Reviewed  Physical Exam   GEN: No acute distress.   Neck: No JVD Cardiac: Irregularly irregular, tachycardic no murmurs, rubs, or gallops.   Respiratory: Ongoing wheeze bilaterally, no change. GI: Soft, nontender, non-distended  MS: No edema; No deformity. Neuro:  Nonfocal  Psych: Normal affect   Labs    High Sensitivity Troponin:   Recent Labs  Lab 09/11/22 1335 09/11/22 1529  TROPONINIHS 5 5     Chemistry Recent Labs  Lab 09/12/22 0142 09/13/22 0118 09/14/22 0135  NA 138 132* 131*  K 3.7 3.6 4.3  CL 102 97* 96*  CO2 '26 25 25  '$ GLUCOSE 108* 118* 115*  BUN 13 9 6*  CREATININE 0.95 0.90 0.86  CALCIUM 8.5* 8.5* 8.7*  MG  --  1.7 1.7  PROT  --  6.0*  --   ALBUMIN  --  3.4*  --   AST  --  29  --   ALT  --  35  --   ALKPHOS  --  93  --   BILITOT  --  0.7  --   GFRNONAA >60 >60 >60  ANIONGAP '10 10 10    '$ Lipids No results for input(s): "CHOL", "TRIG", "HDL", "LABVLDL", "LDLCALC", "CHOLHDL" in the last 168 hours.  Hematology Recent Labs  Lab 09/12/22 [id number]  09/13/22 0118 09/14/22 0135  WBC 10.9* 12.4* 15.8*  RBC 4.65 4.59 4.69  HGB 14.2 14.1 14.6  HCT 43.7 42.6 42.3  MCV 94.0 92.8 90.2  MCH 30.5 30.7 31.1  MCHC 32.5 33.1 34.5  RDW 13.1 13.0 12.7  PLT 190 210 201   Thyroid No results for input(s): "TSH", "FREET4" in the last 168 hours.  BNP Recent Labs  Lab 09/11/22 1632  BNP 225.9*    DDimer No results for input(s): "DDIMER" in the last 168 hours.   Radiology    ECHOCARDIOGRAM COMPLETE  Result Date: 09/12/2022    ECHOCARDIOGRAM REPORT   Patient Name:   Jesse MAGLIONE [name] Date of Exam: 09/12/2022 Medical Rec #:  [id number]            Height:       69.0 in Accession #:    [id number]           Weight:       193.0 lb Date of Birth:  03-24-1961            BSA:          2.035 m Patient Age:    62 years             BP:           119/92 mmHg Patient Gender: M                    HR:           130 bpm. Exam Location:  Inpatient Procedure: 2D Echo, Cardiac Doppler and Color Doppler Indications:    Atrial Fibrillation  History:        Patient has no prior history of Echocardiogram examinations.                  Diastolic HF, Arrythmias:Atrial Fibrillation; Risk                 Factors:Hypertension. Graves disease.  Sonographer:    Eartha Inch Referring Phys: Gwyndolyn Kaufman, E  Sonographer Comments: Image acquisition challenging due to patient body habitus. IMPRESSIONS  1. Left ventricular ejection fraction, by estimation, is 40 to 45%. The left ventricle has mildly decreased function. The left ventricle demonstrates global hypokinesis. There is mild concentric left ventricular hypertrophy. Left ventricular diastolic function could not be evaluated.  2. Right ventricular systolic function is normal. The right ventricular size is mildly enlarged. There is normal pulmonary artery systolic pressure. The estimated right ventricular systolic pressure is 79.8 mmHg.  3. Left atrial size was moderately dilated.  4. Right atrial size was moderately dilated.  5. The mitral valve is normal in structure. Mild mitral valve regurgitation.  6. The aortic valve is tricuspid. Aortic valve regurgitation is not visualized. No aortic stenosis is present.  7. The inferior vena cava is normal in size with greater than 50% respiratory variability, suggesting right atrial pressure of 3 mmHg. FINDINGS  Left Ventricle: Left ventricular ejection fraction, by estimation, is 40 to 45%. The left ventricle has mildly decreased function. The left ventricle demonstrates global hypokinesis. The left ventricular internal cavity size was normal in size. There is  mild concentric left ventricular hypertrophy. Left ventricular diastolic function could not be evaluated due to atrial fibrillation. Left ventricular diastolic function could not be evaluated. Right Ventricle: The right ventricular size is mildly enlarged. No increase in right ventricular wall thickness. Right ventricular systolic function is normal. There is normal pulmonary artery systolic pressure. The  tricuspid regurgitant velocity is 2.14  m/s, and with an assumed right atrial  pressure of 3 mmHg, the estimated right ventricular systolic pressure is 95.0 mmHg. Left Atrium: Left atrial size was moderately dilated. Right Atrium: Right atrial size was moderately dilated. Pericardium: There is no evidence of pericardial effusion. Mitral Valve: The mitral valve is normal in structure. Mild mitral valve regurgitation. Tricuspid Valve: The tricuspid valve is normal in structure. Tricuspid valve regurgitation is trivial. Aortic Valve: The aortic valve is tricuspid. Aortic valve regurgitation is not visualized. No aortic stenosis is present. Pulmonic Valve: The pulmonic valve was normal in structure. Pulmonic valve regurgitation is not visualized. Aorta: The aortic root and ascending aorta are structurally normal, with no evidence of dilitation. Venous: The inferior vena cava is normal in size with greater than 50% respiratory variability, suggesting right atrial pressure of 3 mmHg. IAS/Shunts: The interatrial septum was not well visualized.  LEFT VENTRICLE PLAX 2D LVIDd:         4.50 cm LVIDs:         3.58 cm LV PW:         1.30 cm LV IVS:        1.30 cm  LEFT ATRIUM         Index LA diam:    4.36 cm 2.14 cm/m  TRICUSPID VALVE TR Peak grad:   18.3 mmHg TR Vmax:        214.00 cm/s Sanda Klein MD Electronically signed by Sanda Klein MD Signature Date/Time: 09/12/2022/3:02:12 PM    Final     Cardiac Studies    ECHO 11/2010  EF 60% and mod LA dilatation  Patient Profile     62 y.o. male persistent lesion with rapid ventricular response.  Sees Dr. Lovena Le.  Assessment & Plan    Persistent atrial fibrillation - Previous visits with Dr. Lovena Le, decided on rate control.  Beta-blocker was increased and amlodipine was stopped.  PVCs been fairly reasonably controlled.  No known coronary artery disease but no prior stress test or catheterization. -[facility] and was found to be in A-fib RVR rate 160.  Was given IV diltiazem 20 mg by EMS. -Since  rates were very challenging to control even up to the 170s, IV amiodarone was started.  It was felt that there was not much of a response to metoprolol and diltiazem.  Continue metoprolol to 75 mg 3 times a day to assist with rate control as well (not doing a very good job).  Has an underlying viral illness.  As illness improves, heart rate should improve as well. -Will set up DC cardioversion as patient has not missed any doses of his Xarelto.  Will put him on for tomorrow, Tuesday.   -As outpatient, could consider ablation strategy.  Previously discussed on 08/14/2022 appointment with Dr. Lovena Le.  Chronic anticoagulation - Continue with Xarelto, has not missed doses.   History of Graves' disease - Prior TSH on 12/18 was 0.26 and free T4 was 1.39.  Hyperlipidemia - On simvastatin 40 mg.  Since he is now on amiodarone, we will change this over to Crestor 20 mg a day.  Viral respiratory illness --White count noted. Serum Na decreasing   For questions or updates, please contact [place] Please consult [email or web address] for contact info under        Signed, Candee Furbish, MD  09/14/2022, 8:31 AM

## 2022-09-14 NOTE — Progress Notes (Signed)
**Note De-Identified vi Obfusction** PROGRESS NOTE    Jesse INES [name]  VZD:[id number] DOB: 22-ug-1962 DO: 09/11/2022 PCP: Pcp, No  Chief Complint  Ptient presents with    FIB RVR    Brief Nrrtive:  Jesse Europe [name] is  62 y.o. mle with medicl history significnt of grve's disese s/p rdioctive iodine bltion, HTN, PF.   Pt previously on rhythm control therpy, but fter this didn't work crds went bck to rte control therpy.   [facility] nd found to be in  fib with RVR, rte 160. No chest pin or SOB. He ws given 20 mg dilt by EMS nd 500 CC LR.   HR improved some, given IV BB in ED nd tht seemed to just drop his BP.   Crds consult in ED: they put pt on PO BB + miodrone gtt.   Pt nd whole fmily sick with URI symptoms pst 2 dys.  Cough nd congestion.   Tking ll meds t home including Xrelto.  ssessment & Pln:   Principl Problem:   Proxysml tril fibrilltion with RVR (HCC) ctive Problems:   URI (upper respirtory infection)   Hypothyroidism following rdioiodine therpy   Essentil hypertension   Dyslipidemi   tril fibrilltion with RVR (HCC)  Nuse nd Vomiting Uncler cuse, occurred fter brekfst Resolved t this time, will monitor  tril Fibrilltion with RVR pprecite crds ssistnce - currently on miodrone, metoprolol.  Consider DC crdioversion -> pln for 09/15/2021 (pt hs not missed doses of xrelto) vs bltion outptient. Echo with EF 40-45% mio gtt Metoprolol 75 mg TID Xrelto  HFmrEF pprecite crds recs Currently ppers euvolemic  Rhinovirus Infection CXR with mild edem vs typicl infection Negtive covid, influenz, RSV RVP with rhino/enterovirus  Hypertension Lisinopril on hold Now on metoprolol Continue clonidine ptch   Dyslipidemi Sttin -> trnsitioned to crestor 20 mg dily  Hypothyroidism TSH 12/18 ws suppressed t 0.26 Dr. Dwyne Dee recommended 200  mcg synthroid dily with 1/2 pill on Sund[place] Outptient f/u with endocrine  Recent Ctrct Surgery Prednisolone, ofloxcin    DVT prophylxis: xrelto Code Sttus: full Fmily Communiction: none Disposition:   Sttus is: Inptient Remins inptient pproprite becuse: continued need for mngement of rvr   Consultnts:  Crdiology  Procedures:  Echo IMPRESSIONS     1. Left ventriculr ejection frction, by estimtion, is 40 to 45%. The  left ventricle hs mildly decresed function. The left ventricle  demonstrtes globl hypokinesis. There is mild concentric left ventriculr  hypertrophy. Left ventriculr distolic  function could not be evluted.   2. Right ventriculr systolic function is norml. The right ventriculr  size is mildly enlrged. There is norml pulmonry rtery systolic  pressure. The estimted right ventriculr systolic pressure is 29.5 mmHg.   3. Left tril size ws modertely dilted.   4. Right tril size ws modertely dilted.   5. The mitrl vlve is norml in structure. Mild mitrl vlve  regurgittion.   6. The ortic vlve is tricuspid. ortic vlve regurgittion is not  visulized. No ortic stenosis is present.   7. The inferior ven cv is norml in size with greter thn 50%  respirtory vribility, suggesting right tril pressure of 3 mmHg.   ntimicrobils:  nti-infectives (From dmission, onwrd)    None       Subjective: Hd n/v this m fter eting brekfst Feels better now  Objective: Vitls:   09/13/22 2017 09/14/22 0423 09/14/22 0929 09/14/22 1225  BP: Mrlnd Kitchen)  119/98 (!) 126/90 (!) 114/100 125/74  Pulse: (!) 131 (!) 126 (!) 118 (!) 127  Resp: '18 18 20 20  '$ Temp: 98.1 F (36.7 C) 98.3 F (36.8 C) 99.3 F (37.4 C) 97.7 F (36.5 C)  TempSrc: Oral Oral Oral Oral  SpO2: 94% 95% 91% 95%  Weight:      Height:        Intake/Output Summary (Last 24 hours) at 09/14/2022 1617 Last data filed at 09/14/2022 1100 Gross  per 24 hour  Intake --  Output 2050 ml  Net -2050 ml   Filed Weights   09/11/22 1323  Weight: 87.5 kg    Examination:  General: No acute distress. Cardiovascular: irregularly irregular, tachycardic Lungs: unlabored bdomen: Soft, nontender, nondistended Neurological: lert and oriented 3. Moves all extremities 4 with equal strength. Cranial nerves II through XII grossly intact. Extremities: No clubbing or cyanosis. No edema.   Data Reviewed: I have personally reviewed following labs and imaging studies  CBC: Recent Labs  Lab 09/11/22 1335 09/12/22 0142 09/13/22 0118 09/14/22 0135  WBC 10.4 10.9* 12.4* 15.8*  NEUTROBS  --   --  7.4  --   HGB 14.7 14.2 14.1 14.6  HCT 44.7 43.7 42.6 42.3  MCV 93.7 94.0 92.8 90.2  PLT 188 190 210 536    Basic Metabolic Panel: Recent Labs  Lab 09/11/22 1335 09/12/22 0142 09/13/22 0118 09/14/22 0135  N 137 138 132* 131*  K 4.3 3.7 3.6 4.3  CL 101 102 97* 96*  CO2 '28 26 25 25  '$ GLUCOSE 99 108* 118* 115*  BUN '12 13 9 '$ 6*  CRETININE 0.95 0.95 0.90 0.86  CLCIUM 8.8* 8.5* 8.5* 8.7*  MG  --   --  1.7 1.7  PHOS  --   --  4.0 4.0    GFR: Estimated Creatinine Clearance: 96.3 mL/min (by C-G formula based on SCr of 0.86 mg/dL).  Liver Function Tests: Recent Labs  Lab 09/13/22 0118  ST 29  LT 35  LKPHOS 93  BILITOT 0.7  PROT 6.0*  LBUMIN 3.4*    CBG: No results for input(s): "GLUCP" in the last 168 hours.   Recent Results (from the past 240 hour(s))  Resp panel by RT-PCR (RSV, Flu &B, Covid) nterior Nasal Swab     Status: None   Collection Time: 09/11/22  4:32 PM   Specimen: nterior Nasal Swab  Result Value Ref Range Status   SRS Coronavirus 2 by RT PCR NEGTIVE NEGTIVE Final    Comment: (NOTE) SRS-CoV-2 target nucleic acids are NOT DETECTED.  The SRS-CoV-2 RN is generally detectable in upper respiratory specimens during the acute phase of infection. The lowest concentration of SRS-CoV-2 viral  copies this assay can detect is 138 copies/mL.  negative result does not preclude SRS-Cov-2 infection and should not be used as the sole basis for treatment or other patient management decisions.  negative result may occur with  improper specimen collection/handling, submission of specimen other than nasopharyngeal swab, presence of viral mutation(s) within the areas targeted by this assay, and inadequate number of viral copies(<138 copies/mL).  negative result must be combined with clinical observations, patient history, and epidemiological information. The expected result is Negative.  Fact Sheet for Patients:  EntrepreneurPulse.com.au  Fact Sheet for Healthcare Providers:  IncredibleEmployment.be  This test is no t yet approved or cleared by the [place] FD and  has been authorized for detection and/or diagnosis of SRS-CoV-2 by FD under an Emergency Use uthorization (EU). This EU **Note De-Identified vi Obfusction** will remin  in effect (mening this test cn be used) for the durtion of the COVID-19 declrtion under Section 564(b)(1) of the ct, 21 U.S.C.section 360bbb-3(b)(1), unless the uthoriztion is terminted  or revoked sooner.       Influenz  by PCR NEGTIVE NEGTIVE Finl   Influenz B by PCR NEGTIVE NEGTIVE Finl    Comment: (NOTE) The Xpert Xpress SRS-CoV-2/FLU/RSV plus ssy is intended s n id in the dignosis of influenz from Nsophryngel swb specimens nd should not be used s  sole bsis for tretment. Nsl wshings nd spirtes re uncceptble for Xpert Xpress SRS-CoV-2/FLU/RSV testing.  Fct Sheet for Ptients: EntrepreneurPulse.com.u  Fct Sheet for [place] Providers: IncredibleEmployment.be  This test is not yet pproved or clered by the [place] FD nd hs been uthorized for detection nd/or dignosis of SRS-CoV-2 by FD under n Emergency Use uthoriztion (EU). This  EU will remin in effect (mening this test cn be used) for the durtion of the COVID-19 declrtion under Section 564(b)(1) of the [place], 21 U.S.C. section 360bbb-3(b)(1), unless the uthoriztion is terminted or revoked.     Resp Syncytil Virus by PCR NEGTIVE NEGTIVE Finl    Comment: (NOTE) Fct Sheet for Ptients: EntrepreneurPulse.com.u  Fct Sheet for [place] Providers: IncredibleEmployment.be  This test is not yet pproved or clered by the [place] FD nd hs been uthorized for detection nd/or dignosis of SRS-CoV-2 by FD under n Emergency Use uthoriztion (EU). This EU will remin in effect (mening this test cn be used) for the durtion of the COVID-19 declrtion under Section 564(b)(1) of the ct, 21 U.S.C. section 360bbb-3(b)(1), unless the uthoriztion is terminted or revoked.  Performed t [facility], [place] [address] [place], [place] [address]   Respirtory (~20 pthogens) pnel by PCR     Sttus: bnorml   Collection Time: 09/12/22  8:30 M   Specimen: Nsophryngel Swb; Respirtory  Result Vlue Ref Rnge Sttus   denovirus NOT DETECTED NOT DETECTED Finl   Coronvirus 229E NOT DETECTED NOT DETECTED Finl    Comment: (NOTE) The Coronvirus on the Respirtory Pnel, DOES NOT test for the novel  Coronvirus (2019 nCoV)    Coronvirus HKU1 NOT DETECTED NOT DETECTED Finl   Coronvirus NL63 NOT DETECTED NOT DETECTED Finl   Coronvirus OC43 NOT DETECTED NOT DETECTED Finl   Metpneumovirus NOT DETECTED NOT DETECTED Finl   Rhinovirus / Enterovirus DETECTED () NOT DETECTED Finl   Influenz  NOT DETECTED NOT DETECTED Finl   Influenz B NOT DETECTED NOT DETECTED Finl   Prinfluenz Virus 1 NOT DETECTED NOT DETECTED Finl   Prinfluenz Virus 2 NOT DETECTED NOT DETECTED Finl   Prinfluenz Virus 3 NOT DETECTED NOT DETECTED Finl   Prinfluenz Virus 4 NOT DETECTED NOT DETECTED  Finl   Respirtory Syncytil Virus NOT DETECTED NOT DETECTED Finl   Bordetell pertussis NOT DETECTED NOT DETECTED Finl   Bordetell Prpertussis NOT DETECTED NOT DETECTED Finl   Chlmydophil pneumonie NOT DETECTED NOT DETECTED Finl   Mycoplsm pneumonie NOT DETECTED NOT DETECTED Finl    Comment: Performed t [facility], [address] [place], [place] [address]         Rdiology Studies: No results found.      Scheduled Meds:  guiFENesin  600 mg Orl BID   levothyroxine  200 mcg Orl Once per dy on Mon Tue Wed Thu Fri St   nd   levothyroxine  100 mcg Orl Q Sun   metoprolol trtrte  75  mg Oral TID   montelukast  10 mg Oral QHS   ofloxacin  1 drop Right Eye QID   pantoprazole  40 mg Oral Daily   prednisoLONE acetate  1 drop Right Eye QID   rivaroxaban  20 mg Oral Q supper   rosuvastatin  20 mg Oral Daily   sertraline  50 mg Oral Daily   Continuous Infusions:  amiodarone 30 mg/hr (09/14/22 0709)   promethazine (PHENERGAN) injection (IM or IVPB)       LOS: 2 days    Time spent: over 30 min    Fayrene Helper, MD [facility]   To contact the attending provider between 7A-7P or the covering provider during after hours 7P-7A, please log into the web site [email or web address] and access using universal [place] password for that web site. If you do not have the password, please call the hospital operator.  09/14/2022, 4:17 PM

## 2022-09-14 NOTE — H&P (View-Only) (Signed)
Rounding Note    Patient Name: Jesse GRACIA [name] Date of Encounter: 09/14/2022  [place] Cardiologist: Cristopher Peru, MD   Subjective   Still having trouble with rate control of his atrial fibrillation despite higher metoprolol and amio IV load. No chest pain. SOB with exertion  Inpatient Medications    Scheduled Meds:  guaiFENesin  600 mg Oral BID   levothyroxine  200 mcg Oral Once per day on Mon Tue Wed Thu Fri Sat   And   levothyroxine  100 mcg Oral Q Sun   metoprolol tartrate  75 mg Oral TID   montelukast  10 mg Oral QHS   ofloxacin  1 drop Right Eye QID   pantoprazole  40 mg Oral Daily   prednisoLONE acetate  1 drop Right Eye QID   rivaroxaban  20 mg Oral Q supper   rosuvastatin  20 mg Oral Daily   sertraline  50 mg Oral Daily   Continuous Infusions:  amiodarone 30 mg/hr (09/14/22 0709)   PRN Meds: acetaminophen, clonazePAM, guaiFENesin-dextromethorphan, levalbuterol, ondansetron (ZOFRAN) IV, ondansetron, phenol   Vital Signs    Vitals:   09/13/22 1628 09/13/22 2015 09/13/22 2017 09/14/22 0423  BP: (!) 139/103 (!) 119/98 (!) 119/98 (!) 126/90  Pulse: (!) 101 (!) 103 (!) 131 (!) 126  Resp: '19  18 18  '$ Temp: 97.9 F (36.6 C)  98.1 F (36.7 C) 98.3 F (36.8 C)  TempSrc: Oral  Oral Oral  SpO2: 98%  94% 95%  Weight:      Height:        Intake/Output Summary (Last 24 hours) at 09/14/2022 0831 Last data filed at 09/14/2022 0425 Gross per 24 hour  Intake --  Output 1800 ml  Net -1800 ml      09/11/2022    1:23 PM 09/02/2022   11:05 AM 08/14/2022   11:00 AM  Last 3 Weights  Weight (lbs) 193 lb 193 lb 195 lb  Weight (kg) 87.544 kg 87.544 kg 88.451 kg      Telemetry    Atrial fibrillation heart rate in the 100-150s- Personally Reviewed  ECG    On admission atrial fibrillation 107- Personally Reviewed  Physical Exam   GEN: No acute distress.   Neck: No JVD Cardiac: Irregularly irregular, tachycardic no murmurs, rubs, or gallops.   Respiratory: Ongoing wheeze bilaterally, no change. GI: Soft, nontender, non-distended  MS: No edema; No deformity. Neuro:  Nonfocal  Psych: Normal affect   Labs    High Sensitivity Troponin:   Recent Labs  Lab 09/11/22 1335 09/11/22 1529  TROPONINIHS 5 5     Chemistry Recent Labs  Lab 09/12/22 0142 09/13/22 0118 09/14/22 0135  NA 138 132* 131*  K 3.7 3.6 4.3  CL 102 97* 96*  CO2 '26 25 25  '$ GLUCOSE 108* 118* 115*  BUN 13 9 6*  CREATININE 0.95 0.90 0.86  CALCIUM 8.5* 8.5* 8.7*  MG  --  1.7 1.7  PROT  --  6.0*  --   ALBUMIN  --  3.4*  --   AST  --  29  --   ALT  --  35  --   ALKPHOS  --  93  --   BILITOT  --  0.7  --   GFRNONAA >60 >60 >60  ANIONGAP '10 10 10    '$ Lipids No results for input(s): "CHOL", "TRIG", "HDL", "LABVLDL", "LDLCALC", "CHOLHDL" in the last 168 hours.  Hematology Recent Labs  Lab 09/12/22 [phone]  09/13/22 0118 09/14/22 0135  WBC 10.9* 12.4* 15.8*  RBC 4.65 4.59 4.69  HGB 14.2 14.1 14.6  HCT 43.7 42.6 42.3  MCV 94.0 92.8 90.2  MCH 30.5 30.7 31.1  MCHC 32.5 33.1 34.5  RDW 13.1 13.0 12.7  PLT 190 210 201   Thyroid No results for input(s): "TSH", "FREET4" in the last 168 hours.  BNP Recent Labs  Lab 09/11/22 1632  BNP 225.9*    DDimer No results for input(s): "DDIMER" in the last 168 hours.   Radiology    ECHOCARDIOGRAM COMPLETE  Result Date: 09/12/2022    ECHOCARDIOGRAM REPORT   Patient Name:   Jesse TOTH [name] Date of Exam: 09/12/2022 Medical Rec #:  [id number]            Height:       69.0 in Accession #:    [id number]           Weight:       193.0 lb Date of Birth:  [birth year]            BSA:          2.035 m Patient Age:    62 years             BP:           119/92 mmHg Patient Gender: M                    HR:           130 bpm. Exam Location:  Inpatient Procedure: 2D Echo, Cardiac Doppler and Color Doppler Indications:    Atrial Fibrillation  History:        Patient has no prior history of Echocardiogram examinations.                  Diastolic HF, Arrythmias:Atrial Fibrillation; Risk                 Factors:Hypertension. Graves disease.  Sonographer:    Eartha Inch Referring Phys: Gwyndolyn Kaufman, E  Sonographer Comments: Image acquisition challenging due to patient body habitus. IMPRESSIONS  1. Left ventricular ejection fraction, by estimation, is 40 to 45%. The left ventricle has mildly decreased function. The left ventricle demonstrates global hypokinesis. There is mild concentric left ventricular hypertrophy. Left ventricular diastolic function could not be evaluated.  2. Right ventricular systolic function is normal. The right ventricular size is mildly enlarged. There is normal pulmonary artery systolic pressure. The estimated right ventricular systolic pressure is 16.0 mmHg.  3. Left atrial size was moderately dilated.  4. Right atrial size was moderately dilated.  5. The mitral valve is normal in structure. Mild mitral valve regurgitation.  6. The aortic valve is tricuspid. Aortic valve regurgitation is not visualized. No aortic stenosis is present.  7. The inferior vena cava is normal in size with greater than 50% respiratory variability, suggesting right atrial pressure of 3 mmHg. FINDINGS  Left Ventricle: Left ventricular ejection fraction, by estimation, is 40 to 45%. The left ventricle has mildly decreased function. The left ventricle demonstrates global hypokinesis. The left ventricular internal cavity size was normal in size. There is  mild concentric left ventricular hypertrophy. Left ventricular diastolic function could not be evaluated due to atrial fibrillation. Left ventricular diastolic function could not be evaluated. Right Ventricle: The right ventricular size is mildly enlarged. No increase in right ventricular wall thickness. Right ventricular systolic function is normal. There is normal pulmonary artery systolic pressure. The  tricuspid regurgitant velocity is 2.14  m/s, and with an assumed right atrial  pressure of 3 mmHg, the estimated right ventricular systolic pressure is 64.3 mmHg. Left Atrium: Left atrial size was moderately dilated. Right Atrium: Right atrial size was moderately dilated. Pericardium: There is no evidence of pericardial effusion. Mitral Valve: The mitral valve is normal in structure. Mild mitral valve regurgitation. Tricuspid Valve: The tricuspid valve is normal in structure. Tricuspid valve regurgitation is trivial. Aortic Valve: The aortic valve is tricuspid. Aortic valve regurgitation is not visualized. No aortic stenosis is present. Pulmonic Valve: The pulmonic valve was normal in structure. Pulmonic valve regurgitation is not visualized. Aorta: The aortic root and ascending aorta are structurally normal, with no evidence of dilitation. Venous: The inferior vena cava is normal in size with greater than 50% respiratory variability, suggesting right atrial pressure of 3 mmHg. IAS/Shunts: The interatrial septum was not well visualized.  LEFT VENTRICLE PLAX 2D LVIDd:         4.50 cm LVIDs:         3.58 cm LV PW:         1.30 cm LV IVS:        1.30 cm  LEFT ATRIUM         Index LA diam:    4.36 cm 2.14 cm/m  TRICUSPID VALVE TR Peak grad:   18.3 mmHg TR Vmax:        214.00 cm/s Sanda Klein MD Electronically signed by Sanda Klein MD Signature Date/Time: 09/12/2022/3:02:12 PM    Final     Cardiac Studies    ECHO 11/2010  EF 60% and mod LA dilatation  Patient Profile     62 y.o. male persistent lesion with rapid ventricular response.  Sees Dr. Lovena Le.  Assessment & Plan    Persistent atrial fibrillation - Previous visits with Dr. Lovena Le, decided on rate control.  Beta-blocker was increased and amlodipine was stopped.  PVCs been fairly reasonably controlled.  No known coronary artery disease but no prior stress test or catheterization. -[facility] and was found to be in A-fib RVR rate 160.  Was given IV diltiazem 20 mg by EMS. -Since  rates were very challenging to control even up to the 170s, IV amiodarone was started.  It was felt that there was not much of a response to metoprolol and diltiazem.  Continue metoprolol to 75 mg 3 times a day to assist with rate control as well (not doing a very good job).  Has an underlying viral illness.  As illness improves, heart rate should improve as well. -Will set up DC cardioversion as patient has not missed any doses of his Xarelto.  Will put him on for tomorrow, Tuesday.   -As outpatient, could consider ablation strategy.  Previously discussed on 08/14/2022 appointment with Dr. Lovena Le.  Chronic anticoagulation - Continue with Xarelto, has not missed doses.   History of Graves' disease - Prior TSH on 12/18 was 0.26 and free T4 was 1.39.  Hyperlipidemia - On simvastatin 40 mg.  Since he is now on amiodarone, we will change this over to Crestor 20 mg a day.  Viral respiratory illness --White count noted. Serum Na decreasing   For questions or updates, please contact [place] Please consult [email or web address] for contact info under        Signed, Candee Furbish, MD  09/14/2022, 8:31 AM

## 2022-09-15 ENCOUNTER — Encounter (HOSPITAL_COMMUNITY)
Admission: EM | Disposition: A | Discharge status: Home / Self Care | Payer: Self-pay | Source: Ambulatory Visit | Attending: Family Medicine

## 2022-09-15 ENCOUNTER — Other Ambulatory Visit (HOSPITAL_COMMUNITY): Payer: Self-pay

## 2022-09-15 ENCOUNTER — Encounter (HOSPITAL_COMMUNITY): Payer: Self-pay | Admitting: Family Medicine

## 2022-09-15 ENCOUNTER — Inpatient Hospital Stay (HOSPITAL_COMMUNITY): Payer: 59 | Admitting: Anesthesiology

## 2022-09-15 DIAGNOSIS — F17200 Nicotine dependence, unspecified, uncomplicated: Secondary | ICD-10-CM

## 2022-09-15 DIAGNOSIS — I10 Essential (primary) hypertension: Secondary | ICD-10-CM

## 2022-09-15 DIAGNOSIS — J449 Chronic obstructive pulmonary disease, unspecified: Secondary | ICD-10-CM

## 2022-09-15 DIAGNOSIS — F418 Other specified anxiety disorders: Secondary | ICD-10-CM

## 2022-09-15 DIAGNOSIS — I4891 Unspecified atrial fibrillation: Secondary | ICD-10-CM

## 2022-09-15 DIAGNOSIS — I480 Paroxysmal atrial fibrillation: Secondary | ICD-10-CM | POA: Diagnosis not present

## 2022-09-15 HISTORY — PX: CARDIOVERSION: SHX1299

## 2022-09-15 LAB — BASIC METABOLIC PANEL
Anion gap: 11 (ref 5–15)
BUN: 8 mg/dL (ref 8–23)
CO2: 25 mmol/L (ref 22–32)
Calcium: 8.7 mg/dL — ABNORMAL LOW (ref 8.9–10.3)
Chloride: 99 mmol/L (ref 98–111)
Creatinine, Ser: 1.11 mg/dL (ref 0.61–1.24)
GFR, Estimated: >60 mL/min (ref >60)
Glucose, Bld: 96 mg/dL (ref 70–99)
Potassium: 3.5 mmol/L (ref 3.5–5.1)
Sodium: 135 mmol/L (ref 135–145)

## 2022-09-15 LAB — CBC
HCT: 41.9 % (ref 39.0–52.0)
Hemoglobin: 13.8 g/dL (ref 13.0–17.0)
MCH: 30.5 pg (ref 26.0–34.0)
MCHC: 32.9 g/dL (ref 30.0–36.0)
MCV: 92.5 fL (ref 80.0–100.0)
Platelets: 208 10*3/uL (ref 150–400)
RBC: 4.53 MIL/uL (ref 4.22–5.81)
RDW: 12.8 % (ref 11.5–15.5)
WBC: 15.2 10*3/uL — ABNORMAL HIGH (ref 4.0–10.5)
nRBC: 0 % (ref 0.0–0.2)

## 2022-09-15 LAB — PHOSPHORUS: Phosphorus: 3.8 mg/dL (ref 2.5–4.6)

## 2022-09-15 LAB — MAGNESIUM: Magnesium: 1.7 mg/dL (ref 1.7–2.4)

## 2022-09-15 SURGERY — CARDIOVERSION
Anesthesia: General

## 2022-09-15 MED ORDER — SODIUM CHLORIDE 0.9 % IV SOLN
INTRAVENOUS | Status: AC | PRN
  Administered 2022-09-15: 500 mL via INTRAVENOUS

## 2022-09-15 MED ORDER — LEVOTHYROXINE SODIUM 200 MCG PO TABS
200.0000 ug | ORAL_TABLET | Freq: Every day | ORAL | 3 refills | Status: DC
  Filled 2022-09-15: qty 30, 30d supply, fill #0

## 2022-09-15 MED ORDER — AMIODARONE HCL 200 MG PO TABS
ORAL_TABLET | ORAL | 0 refills | Status: DC
  Filled 2022-09-15: qty 60, 30d supply, fill #0

## 2022-09-15 MED ORDER — METOPROLOL TARTRATE 100 MG PO TABS
100.0000 mg | ORAL_TABLET | Freq: Two times a day (BID) | ORAL | 1 refills | Status: DC
  Filled 2022-09-15: qty 60, 30d supply, fill #0

## 2022-09-15 MED ORDER — AMIODARONE HCL 200 MG PO TABS
200.0000 mg | ORAL_TABLET | Freq: Two times a day (BID) | ORAL | Status: DC
  Administered 2022-09-15: 200 mg via ORAL
  Filled 2022-09-15: qty 1

## 2022-09-15 MED ORDER — AMIODARONE HCL 200 MG PO TABS
ORAL_TABLET | ORAL | 0 refills | Status: DC
  Filled 2022-09-15: qty 44, 30d supply, fill #0

## 2022-09-15 MED ORDER — ROSUVASTATIN CALCIUM 20 MG PO TABS
20.0000 mg | ORAL_TABLET | Freq: Every day | ORAL | 1 refills | Status: DC
  Filled 2022-09-15: qty 30, 30d supply, fill #0

## 2022-09-15 MED ORDER — ORAL CARE MOUTH RINSE: 15.0000 mL | OROMUCOSAL | Status: DC | PRN

## 2022-09-15 MED ORDER — LIDOCAINE 2% (20 MG/ML) 5 ML SYRINGE
INTRAMUSCULAR | Status: DC | PRN
  Administered 2022-09-15: 100 mg via INTRAVENOUS

## 2022-09-15 MED ORDER — METOPROLOL TARTRATE 100 MG PO TABS: 100.0000 mg | ORAL_TABLET | Freq: Two times a day (BID) | ORAL | Status: DC

## 2022-09-15 MED ORDER — PROPOFOL 10 MG/ML IV BOLUS
INTRAVENOUS | Status: DC | PRN
  Administered 2022-09-15: 80 mg via INTRAVENOUS

## 2022-09-15 NOTE — Progress Notes (Signed)
Patient taken via [facility] for cardioversion.

## 2022-09-15 NOTE — Progress Notes (Signed)
    Maintaining sinus rhythm post cardioversion. We will go ahead and change his amiodarone to 200 mg twice a day.  In 2 weeks he can decrease to 200 mg once a day. We will change metoprolol to 100 mg twice a day  Okay for discharge from our perspective.  Candee Furbish, MD

## 2022-09-15 NOTE — Transfer of Care (Signed)
Immediate Anesthesia Transfer of Care Note  Patient: Jesse Europe [name]  Procedure(s) Performed: CARDIOVERSION  Patient Location: Endoscopy Unit  Anesthesia Type:General  Level of Consciousness: drowsy and patient cooperative  Airway & Oxygen Therapy: Patient Spontanous Breathing  Post-op Assessment: Report given to RN and Post -op Vital signs reviewed and stable  Post vital signs: Reviewed and stable  Last Vitals:  Vitals Value Taken Time  BP    Temp    Pulse    Resp    SpO2      Last Pain:  Vitals:   09/15/22 1106  TempSrc: Temporal  PainSc: 0-No pain      Patients Stated Pain Goal: 0 (56/72/09 1980)  Complications: No notable events documented.

## 2022-09-15 NOTE — Progress Notes (Signed)
Patient returned from cardioversion at 1211hrs.  SR on monitor, HR 62.

## 2022-09-15 NOTE — Discharge Instructions (Signed)
Reviewed discharge instructions with patient, patient states he understands.

## 2022-09-15 NOTE — Anesthesia Postprocedure Evaluation (Signed)
Anesthesia Post Note  Patient: Jesse Suarez.  Procedure(s) Performed: CARDIOVERSION     Patient location during evaluation: PACU Anesthesia Type: General Level of consciousness: awake and alert Pain management: pain level controlled Vital Signs Assessment: post-procedure vital signs reviewed and stable Respiratory status: spontaneous breathing, nonlabored ventilation, respiratory function stable and patient connected to nasal cannula oxygen Cardiovascular status: blood pressure returned to baseline and stable Postop Assessment: no apparent nausea or vomiting Anesthetic complications: no  No notable events documented.  Last Vitals:  Vitals:   09/15/22 1153 09/15/22 1156  BP: 112/80 113/87  Pulse: 60 60  Resp: (!) 22 (!) 27  Temp:    SpO2: 94% 93%    Last Pain:  Vitals:   09/15/22 1106  TempSrc: Temporal  PainSc: 0-No pain                 ,

## 2022-09-15 NOTE — Progress Notes (Signed)
Mobility Specialist - Progress Note   09/15/22 0956  Mobility  Activity Ambulated with assistance to bathroom  Level of Assistance Standby assist, set-up cues, supervision of patient - no hands on  Assistive Device Other (Comment) (iv POLE)  Distance Ambulated (ft) 10 ft  Activity Response Tolerated well  Mobility Referral No  $Mobility charge 1 Mobility   Pt received EOB needing assistance to BR. No complaints throughout. Pt was left in BR with all needs met.   Franki Monte  Mobility Specialist Please contact via Solicitor or Rehab office at [phone]

## 2022-09-15 NOTE — CV Procedure (Signed)
    Electrical Cardioversion Procedure Note Dallan Schonberg [id number] March 21, 1961  Procedure: Electrical Cardioversion Indications:  Atrial Fibrillation  Time Out: Verified patient identification, verified procedure,medications/allergies/relevent history reviewed, required imaging and test results available.  Performed  Procedure Details  The patient was NPO after midnight. Anesthesia was administered at the beside  by Dr.Odonno.  Cardioversion was done with synchronized biphasic defibrillation with AP pads with 200 Joules.  The patient converted to normal sinus rhythm. The patient tolerated the procedure well   IMPRESSION:  Successful cardioversion of atrial fibrillation     A  09/15/2022, 11:41 AM

## 2022-09-15 NOTE — Interval H&P Note (Signed)
History and Physical Interval Note:  09/15/2022 11:11 AM  Jesse R Hollan [name]  has presented today for surgery, with the diagnosis of afib.  The various methods of treatment have been discussed with the patient and family. After consideration of risks, benefits and other options for treatment, the patient has consented to  Procedure(s): CARDIOVERSION (N/A) as a surgical intervention.  The patient's history has been reviewed, patient examined, no change in status, stable for surgery.  I have reviewed the patient's chart and labs.  Questions were answered to the patient's satisfaction.      A 

## 2022-09-15 NOTE — Care Management (Signed)
  Transition of Care (TOC) Screening Note   Patient Details  Name: Jesse GULLION [name] Date of Birth: Sep 28, 1960   Transition [facility] CM/SW Contact:    Bethena Roys, RN Phone Number: 09/15/2022, 2:13 PM    Transition of Care Department [facility]) has reviewed the patient and no TOC needs have been identified at this time. We will continue to monitor patient advancement through interdisciplinary progression rounds. If new patient transition needs arise, please place a TOC consult.

## 2022-09-15 NOTE — Discharge Summary (Signed)
**Note De-Identified vi Obfusction** Physicin Dischrge Summry  JAYCOB MCCLENTON Suarez. ZOX:[id number] DOB: Jul 27, 1961 DOA: 09/11/2022  PCP: Merryl Hcker, No  Admit dte: 09/11/2022 Dischrge dte: 09/15/2022  Time spent: 40 minutes  Recommendtions for Outptient Follow-up:  Follow outptient CBC/CMP  Follow with crdiology outptient Follow hypertension outptient  Follow slightly reduced EF with crds outptient   Dischrge Dignoses:  Principl Problem:   Proxysml tril fibrilltion with RVR (Slins) Active Problems:   URI (upper respirtory infection)   Hypothyroidism following rdioiodine therpy   Essentil hypertension   Dyslipidemi   Atril fibrilltion with RVR (Strng)   Dischrge Condition: stble  Diet recommendtion: hert helthy  Filed Weights   09/11/22 1323  Weight: 87.5 kg    History of present illness:  Jesse Suarez. is  62 y.o. mle with medicl history significnt of grve's disese s/p rdioctive iodine bltion, HTN, PAF who presented with  fib with RVR in setting of rhinovirus infection.  Now s/p crdioversion.  See below for dditionl detils  Hospitl Course:  Assessment nd Pln: Atril Fibrilltion with RVR Apprecite crds ssistnce - currently on miodrone, metoprolol.  Consider DC crdioversion -> pln for 09/15/2021 (pt hs not missed doses of xrelto) vs bltion outptient. Echo with EF 40-45% Now in sinus rhythm fter crdioversion, follow with crds outptient on mio/metop  Xrelto   HFmrEF Apprecite crds recs Currently ppers euvolemic   Rhinovirus Infection CXR with mild edem vs typicl infection Negtive covid, influenz, RSV RVP with rhino/enterovirus   Hypertension Resume lisinopril fter d/c, wtch for LH Now on metoprolol Continue clonidine ptch (he still hd ptch on from home until tody?), continue fter d/c BP on higher side, mildly (81'X distolic, 914'N systolic)   Dyslipidemi Sttin -> trnsitioned to crestor 20 mg dily    Hypothyroidism TSH 12/18 ws suppressed t 0.26 Dr. Dwyne Dee recommended 200 mcg synthroid dily with 1/2 pill on Sundys Outptient f/u with endocrine   Nuse nd Vomiting Resolved  Recent Ctrct Surgery Prednisolone, ofloxcin      Procedures: 1/2 IMPRESSION:   Successful crdioversion of tril fibrilltion  Echo IMPRESSIONS     1. Left ventriculr ejection frction, by estimtion, is 40 to 45%. The  left ventricle hs mildly decresed function. The left ventricle  demonstrtes globl hypokinesis. There is mild concentric left ventriculr  hypertrophy. Left ventriculr distolic  function could not be evluted.   2. Right ventriculr systolic function is norml. The right ventriculr  size is mildly enlrged. There is norml pulmonry rtery systolic  pressure. The estimted right ventriculr systolic pressure is 82.9 mmHg.   3. Left tril size ws modertely dilted.   4. Right tril size ws modertely dilted.   5. The mitrl vlve is norml in structure. Mild mitrl vlve  regurgittion.   6. The ortic vlve is tricuspid. Aortic vlve regurgittion is not  visulized. No ortic stenosis is present.   7. The inferior ven cv is norml in size with greter thn 50%  respirtory vribility, suggesting right tril pressure of 3 mmHg.     Consulttions: crdiology  Dischrge Exm: Vitls:   09/15/22 1300 09/15/22 1316  BP: (!) 137/98 (!) 135/97  Pulse:  63  Resp: 20 20  Temp:    SpO2:  95%   No new complints Feels better fter crdioversion  Generl: No cute distress. Crdiovsculr: Hert sounds show  regulr rte, nd rhythm.  Lungs: Cler to usculttion bilterlly Abdomen: Soft, nontender, nondistended  Neurologicl: Alert nd oriented 3. Moves ll extremities 4 with equl strength. Crnil **Note De-Identified vi Obfusction** nerves II through XII grossly intct. Extremities: No clubbing or cynosis. No edem.  Dischrge Instructions   Dischrge Instructions      Cll MD for:  difficulty brething, hedche or visul disturbnces   Complete by: s directed    Cll MD for:  extreme ftigue   Complete by: s directed    Cll MD for:  hives   Complete by: s directed    Cll MD for:  persistnt dizziness or light-hededness   Complete by: s directed    Cll MD for:  persistnt nuse nd vomiting   Complete by: s directed    Cll MD for:  redness, tenderness, or signs of infection (pin, swelling, redness, odor or green/yellow dischrge round incision site)   Complete by: s directed    Cll MD for:  severe uncontrolled pin   Complete by: s directed    Cll MD for:  temperture >100.4   Complete by: s directed    Diet - low sodium hert helthy   Complete by: s directed    Dischrge instructions   Complete by: s directed    You were seen for tril fibrilltion with  fst hert rte.  You lso hd the common cold virus.  You've improved fter your crdioversion.  We'll send you home with miodrone.    We'll strt you on metoprolol, stop your coreg.  Continue clonidine ptch.  You hven't been on lisinopril here, but resume this on dischrge.  Wtch for ny lighthededness/dizziness.   Stop your simvsttin.  Strt crestor.   Your hert ultrsound showed  slightly weker hert.  Follow with crdiology regrding this.  You should hve  repet chest x ry outptient.   Your white blood cell count is slightly elevted.  You should get repet lbs outptient.   Return for new, recurrent, or worsening symptoms.  Plese sk your PCP to request records from this hospitliztion so they know wht ws done nd wht the next steps will be.   Increse ctivity slowly   Complete by: s directed       llergies s of 09/15/2022       Rections   Sulf Drugs Cross Rectors Hives   Codeine Other (See Comments)   Mde him very jittery    Hydrochlorothizide Other (See Comments)   Hypontremi        Mediction List     STOP  tking these medictions    crvedilol 25 MG tblet Commonly known s: COREG   simvsttin 40 MG tblet Commonly known s: ZOCOR       TKE these medictions    miodrone 200 MG tblet Commonly known s: PCERONE Tke 1 tblet (200 mg totl) by mouth 2 (two) times dily for 14 dys, THEN 1 tblet (200 mg totl) 2 (two) times dily. Follow up with crdiology for refills. Strt tking on: Jnury 2, 2024   cloNIDine 0.1 mg/24hr ptch Commonly known s: CTPRES - Dosed in mg/24 hr PPLY 1 PTCH ONCE  WEEK   fluticsone 50 MCG/CT nsl spry Commonly known s: FLONSE Plce 2 sprys into both nostrils dily s needed for llergies.   levothyroxine 200 MCG tblet Commonly known s: SYNTHROID Tke 1 tblet (200 mcg totl) by mouth dily. Tke s recommended by endocrine.  200 mg dily except for Sundy, tke 100 mg. Wht chnged: dditionl instructions   lisinopril 40 MG tblet Commonly known s: ZESTRIL TKE 1 TBLET BY MOUTH EVERY DY   metoprolol trtrte 100 MG tblet Commonly  known as: LOPRESSOR Take 1 tablet (100 mg total) by mouth 2 (two) times daily.   montelukast 10 MG tablet Commonly known as: SINGULAIR TAKE 1 TABLET BY MOUTH EVERYDAY AT BEDTIME What changed: See the new instructions.   ofloxacin 0.3 % ophthalmic solution Commonly known as: OCUFLOX Place 1 drop into the right eye 4 (four) times daily.   prednisoLONE acetate 1 % ophthalmic suspension Commonly known as: PRED FORTE Place 1 drop into the right eye 4 (four) times daily.   rivaroxaban 20 MG Tabs tablet Commonly known as: XARELTO Take 1 tablet (20 mg total) by mouth daily with supper.   rosuvastatin 20 MG tablet Commonly known as: CRESTOR Take 1 tablet (20 mg total) by mouth daily. Start taking on: September 16, 2022   sertraline 50 MG tablet Commonly known as: ZOLOFT Take 1 tablet (50 mg total) by mouth daily. What changed: when to take this   tamsulosin 0.4 MG Caps capsule Commonly  known as: FLOMAX TAKE 2 CAPSULES BY MOUTH EVERY DAY   Vitamin D-3 125 MCG (5000 UT) Tabs Take 5,000 Units by mouth daily.       Allergies  Allergen Reactions   Sulfa Drugs Cross Reactors Hives   Codeine Other (See Comments)    Made him very jittery    Hydrochlorothiazide Other (See Comments)    Hyponatremia      The results of significant diagnostics from this hospitalization (including imaging, microbiology, ancillary and laboratory) are listed below for reference.    Significant Diagnostic Studies: ECHOCARDIOGRAM COMPLETE  Result Date: 09/12/2022    ECHOCARDIOGRAM REPORT   Patient Name:   Jesse RYNER Suarez. Date of Exam: 09/12/2022 Medical Rec #:  [id number]            Height:       69.0 in Accession #:    [id number]           Weight:       193.0 lb Date of Birth:  13-Feb-1961            BSA:          2.035 m Patient Age:    74 years             BP:           119/92 mmHg Patient Gender: M                    HR:           130 bpm. Exam Location:  Inpatient Procedure: 2D Echo, Cardiac Doppler and Color Doppler Indications:    Atrial Fibrillation  History:        Patient has no prior history of Echocardiogram examinations.                 Diastolic HF, Arrythmias:Atrial Fibrillation; Risk                 Factors:Hypertension. Graves disease.  Sonographer:    Eartha Inch Referring Phys: Gwyndolyn Kaufman, E  Sonographer Comments: Image acquisition challenging due to patient body habitus. IMPRESSIONS  1. Left ventricular ejection fraction, by estimation, is 40 to 45%. The left ventricle has mildly decreased function. The left ventricle demonstrates global hypokinesis. There is mild concentric left ventricular hypertrophy. Left ventricular diastolic function could not be evaluated.  2. Right ventricular systolic function is normal. The right ventricular size is mildly enlarged. There is normal pulmonary artery systolic pressure. The estimated right ventricular systolic pressure is **Note De-Identified vi Obfusction** 21.3  mmHg.  3. Left tril size ws modertely dilted.  4. Right tril size ws modertely dilted.  5. The mitrl vlve is norml in structure. Mild mitrl vlve regurgittion.  6. The ortic vlve is tricuspid. ortic vlve regurgittion is not visulized. No ortic stenosis is present.  7. The inferior ven cv is norml in size with greter thn 50% respirtory vribility, suggesting right tril pressure of 3 mmHg. FINDINGS  Left Ventricle: Left ventriculr ejection frction, by estimtion, is 40 to 45%. The left ventricle hs mildly decresed function. The left ventricle demonstrtes globl hypokinesis. The left ventriculr internl cvity size ws norml in size. There is  mild concentric left ventriculr hypertrophy. Left ventriculr distolic function could not be evluted due to tril fibrilltion. Left ventriculr distolic function could not be evluted. Right Ventricle: The right ventriculr size is mildly enlrged. No increse in right ventriculr wll thickness. Right ventriculr systolic function is norml. There is norml pulmonry rtery systolic pressure. The tricuspid regurgitnt velocity is 2.14  m/s, nd with n ssumed right tril pressure of 3 mmHg, the estimted right ventriculr systolic pressure is 00.9 mmHg. Left trium: Left tril size ws modertely dilted. Right trium: Right tril size ws modertely dilted. Pericrdium: There is no evidence of pericrdil effusion. Mitrl Vlve: The mitrl vlve is norml in structure. Mild mitrl vlve regurgittion. Tricuspid Vlve: The tricuspid vlve is norml in structure. Tricuspid vlve regurgittion is trivil. ortic Vlve: The ortic vlve is tricuspid. ortic vlve regurgittion is not visulized. No ortic stenosis is present. Pulmonic Vlve: The pulmonic vlve ws norml in structure. Pulmonic vlve regurgittion is not visulized. ort: The ortic root nd scending ort re structurlly norml, with no evidence of dilittion.  Venous: The inferior ven cv is norml in size with greter thn 50% respirtory vribility, suggesting right tril pressure of 3 mmHg. IS/Shunts: The intertril septum ws not well visulized.  LEFT VENTRICLE PLX 2D LVIDd:         4.50 cm LVIDs:         3.58 cm LV PW:         1.30 cm LV IVS:        1.30 cm  LEFT TRIUM         Index L dim:    4.36 cm 2.14 cm/m  TRICUSPID VLVE TR Pek grd:   18.3 mmHg TR Vmx:        214.00 cm/s Snd Klein MD Electroniclly signed by Snd Klein MD Signture Dte/Time: 09/12/2022/3:02:12 PM    Finl    DG Chest 2 View  Result Dte: 09/11/2022 CLINICL DT:  Plpittions in  88 yer old mle. EXM: CHEST - 2 VIEW COMPRISON:  May 25, 2022 FINDINGS: Crdiomedistinl contours nd hilr structures re stble. Mild incresed interstitil mrkings re present in the chest. Elevted RIGHT hemidiphrgm with juxt diphrgmtic telectsis. No lobr consolidtion. No sign of pneumothorx. On limited ssessment no cute skeletl process. IMPRESSION: 1. Mild incresed interstitil mrkings in the chest my represent mild edem or typicl infection. 2. Elevted RIGHT hemidiphrgm with juxt diphrgmtic telectsis. Electroniclly Signed   By: Zett Bills M.D.   On: 09/11/2022 13:55    Microbiology: Recent Results (from the pst 240 hour(s))  Resp pnel by RT-PCR (RSV, Flu &B, Covid) nterior Nsl Swb     Sttus: None   Collection Time: 09/11/22  4:32 PM   Specimen: nterior Nsl Swb  Result Vlue Ref Rnge Sttus   SRS Coronvirus 2 by RT PCR NEGTIVE NEGTIVE Finl **Note De-Identified vi Obfusction** Comment: (NOTE) SRS-CoV-2 trget nucleic cids re NOT DETECTED.  The SRS-CoV-2 RN is generlly detectble in upper respirtory specimens during the cute phse of infection. The lowest concentrtion of SRS-CoV-2 virl copies this ssy cn detect is 138 copies/mL.  negtive result does not preclude SRS-Cov-2 infection nd should not be used s the sole bsis  for tretment or other ptient mngement decisions.  negtive result my occur with  improper specimen collection/hndling, submission of specimen other thn nsophryngel swb, presence of virl muttion(s) within the res trgeted by this ssy, nd indequte number of virl copies(<138 copies/mL).  negtive result must be combined with clinicl observtions, ptient history, nd epidemiologicl informtion. The expected result is Negtive.  Fct Sheet for Ptients:  EntrepreneurPulse.com.u  Fct Sheet for [place] Providers:  IncredibleEmployment.be  This test is no t yet pproved or clered by the [place] FD nd  hs been uthorized for detection nd/or dignosis of SRS-CoV-2 by FD under n Emergency Use uthoriztion (EU). This EU will remin  in effect (mening this test cn be used) for the durtion of the COVID-19 declrtion under Section 564(b)(1) of the ct, 21 U.S.C.section 360bbb-3(b)(1), unless the uthoriztion is terminted  or revoked sooner.       Influenz  by PCR NEGTIVE NEGTIVE Finl   Influenz B by PCR NEGTIVE NEGTIVE Finl    Comment: (NOTE) The Xpert Xpress SRS-CoV-2/FLU/RSV plus ssy is intended s n id in the dignosis of influenz from Nsophryngel swb specimens nd should not be used s  sole bsis for tretment. Nsl wshings nd spirtes re uncceptble for Xpert Xpress SRS-CoV-2/FLU/RSV testing.  Fct Sheet for Ptients: EntrepreneurPulse.com.u  Fct Sheet for [place] Providers: IncredibleEmployment.be  This test is not yet pproved or clered by the [place] FD nd hs been uthorized for detection nd/or dignosis of SRS-CoV-2 by FD under n Emergency Use uthoriztion (EU). This EU will remin in effect (mening this test cn be used) for the durtion of the COVID-19 declrtion under Section 564(b)(1) of the ct, 21  U.S.C. section 360bbb-3(b)(1), unless the uthoriztion is terminted or revoked.     Resp Syncytil Virus by PCR NEGTIVE NEGTIVE Finl    Comment: (NOTE) Fct Sheet for Ptients: EntrepreneurPulse.com.u  Fct Sheet for [place] Providers: IncredibleEmployment.be  This test is not yet pproved or clered by the [place] FD nd hs been uthorized for detection nd/or dignosis of SRS-CoV-2 by FD under n Emergency Use uthoriztion (EU). This EU will remin in effect (mening this test cn be used) for the durtion of the COVID-19 declrtion under Section 564(b)(1) of the ct, 21 U.S.C. section 360bbb-3(b)(1), unless the uthoriztion is terminted or revoked.  Performed t [facility], [place] [address] [place], [place] [address]   Respirtory (~20 pthogens) pnel by PCR     Sttus: bnorml   Collection Time: 09/12/22  8:30 M   Specimen: Nsophryngel Swb; Respirtory  Result Vlue Ref Rnge Sttus   denovirus NOT DETECTED NOT DETECTED Finl   Coronvirus 229E NOT DETECTED NOT DETECTED Finl    Comment: (NOTE) The Coronvirus on the Respirtory Pnel, DOES NOT test for the novel  Coronvirus (2019 nCoV)    Coronvirus HKU1 NOT DETECTED NOT DETECTED Finl   Coronvirus NL63 NOT DETECTED NOT DETECTED Finl   Coronvirus OC43 NOT DETECTED NOT DETECTED Finl   Metpneumovirus NOT DETECTED NOT DETECTED Finl   Rhinovirus / Enterovirus DETECTED () NOT DETECTED Finl   Influenz  NOT DETECTED NOT DETECTED Finl   Influenz B NOT  DETECTED NOT DETECTED Final   Parainfluenza Virus 1 NOT DETECTED NOT DETECTED Final   Parainfluenza Virus 2 NOT DETECTED NOT DETECTED Final   Parainfluenza Virus 3 NOT DETECTED NOT DETECTED Final   Parainfluenza Virus 4 NOT DETECTED NOT DETECTED Final   Respiratory Syncytial Virus NOT DETECTED NOT DETECTED Final   Bordetella pertussis NOT DETECTED NOT DETECTED Final   Bordetella  Parapertussis NOT DETECTED NOT DETECTED Final   Chlamydophila pneumoniae NOT DETECTED NOT DETECTED Final   Mycoplasma pneumoniae NOT DETECTED NOT DETECTED Final    Comment: Performed at [facility], [place] [address], [place], [place] [address]     Labs: Basic Metabolic Panel: Recent Labs  Lab 09/11/22 1335 09/12/22 0142 09/13/22 0118 09/14/22 0135 09/15/22 0230  NA 137 138 132* 131* 135  K 4.3 3.7 3.6 4.3 3.5  CL 101 102 97* 96* 99  CO2 '28 26 25 25 25  '$ GLUCOSE 99 108* 118* 115* 96  BUN '12 13 9 '$ 6* 8  CREATININE 0.95 0.95 0.90 0.86 1.11  CALCIUM 8.8* 8.5* 8.5* 8.7* 8.7*  MG  --   --  1.7 1.7 1.7  PHOS  --   --  4.0 4.0 3.8   Liver Function Tests: Recent Labs  Lab 09/13/22 0118  AST 29  ALT 35  ALKPHOS 93  BILITOT 0.7  PROT 6.0*  ALBUMIN 3.4*   No results for input(s): "LIPASE", "AMYLASE" in the last 168 hours. No results for input(s): "AMMONIA" in the last 168 hours. CBC: Recent Labs  Lab 09/11/22 1335 09/12/22 0142 09/13/22 0118 09/14/22 0135 09/15/22 0230  WBC 10.4 10.9* 12.4* 15.8* 15.2*  NEUTROABS  --   --  7.4  --   --   HGB 14.7 14.2 14.1 14.6 13.8  HCT 44.7 43.7 42.6 42.3 41.9  MCV 93.7 94.0 92.8 90.2 92.5  PLT 188 190 210 201 208   Cardiac Enzymes: No results for input(s): "CKTOTAL", "CKMB", "CKMBINDEX", "TROPONINI" in the last 168 hours. BNP: BNP (last 3 results) Recent Labs    09/11/22 1632  BNP 225.9*    ProBNP (last 3 results) No results for input(s): "PROBNP" in the last 8760 hours.  CBG: No results for input(s): "GLUCAP" in the last 168 hours.     Signed:  Fayrene Helper MD.  [facility] 09/15/2022, 2:32 PM

## 2022-09-15 NOTE — Anesthesia Preprocedure Evaluation (Addendum)
Anesthesia Evaluation  Patient identified by MRN, date of birth, ID band Patient awake    Reviewed: Allergy & Precautions, H&P , NPO status , Patient's Chart, lab work & pertinent test results  Airway Mallampati: II  TM Distance: >3 FB Neck ROM: Full    Dental no notable dental hx. (+) Poor Dentition, Loose, Dental Advisory Given   Pulmonary pneumonia, COPD, Current Smoker and Patient abstained from smoking.   Pulmonary exam normal breath sounds clear to auscultation       Cardiovascular hypertension, Pt. on medications Normal cardiovascular exam+ dysrhythmias Atrial Fibrillation  Rhythm:Regular Rate:Normal     Neuro/Psych  PSYCHIATRIC DISORDERS Anxiety Depression     Neuromuscular disease negative neurological ROS  negative psych ROS   GI/Hepatic negative GI ROS, Neg liver ROS,,,  Endo/Other  Hypothyroidism    Renal/GU Renal disease  negative genitourinary   Musculoskeletal negative musculoskeletal ROS (+)    Abdominal   Peds negative pediatric ROS (+)  Hematology negative hematology ROS (+)   Anesthesia Other Findings   Reproductive/Obstetrics negative OB ROS                             Anesthesia Physical Anesthesia Plan  ASA: 3  Anesthesia Plan: General   Post-op Pain Management: Minimal or no pain anticipated   Induction: Intravenous  PONV Risk Score and Plan: 2  Airway Management Planned: Natural Airway and Simple Face Mask  Additional Equipment:   Intra-op Plan:   Post-operative Plan: Extubation in OR  Informed Consent:   Plan Discussed with: Anesthesiologist  Anesthesia Plan Comments: (  )       Anesthesia Quick Evaluation

## 2022-09-15 NOTE — Progress Notes (Addendum)
Progress Note  Patient Name: Jesse ALLSUP [name] Date of Encounter: 09/15/2022  Primary Cardiologist: Cristopher Peru, MD   Subjective   No changes from yesterday.  He is still in atrial fibrillation with predominantly rapid ventricular rate with rates from 100-120.  He remains asymptomatic and is ready for his cardioversion today.  Inpatient Medications    Scheduled Meds:  [MAR Hold] guaiFENesin  600 mg Oral BID   [MAR Hold] levothyroxine  200 mcg Oral Once per day on Mon Tue Wed Thu Fri Sat   And   [MAR Hold] levothyroxine  100 mcg Oral Q Sun   [facility] Hold] metoprolol tartrate  75 mg Oral TID   [MAR Hold] montelukast  10 mg Oral QHS   [MAR Hold] ofloxacin  1 drop Right Eye QID   [MAR Hold] pantoprazole  40 mg Oral Daily   [MAR Hold] prednisoLONE acetate  1 drop Right Eye QID   [MAR Hold] rivaroxaban  20 mg Oral Q supper   [MAR Hold] rosuvastatin  20 mg Oral Daily   [MAR Hold] sertraline  50 mg Oral Daily   [MAR Hold] traZODone  50 mg Oral QHS   Continuous Infusions:  sodium chloride     amiodarone 30 mg/hr (09/15/22 0439)   [MAR Hold] promethazine (PHENERGAN) injection (IM or IVPB)     PRN Meds: [MAR Hold] acetaminophen, [MAR Hold] clonazePAM, [MAR Hold] guaiFENesin-dextromethorphan, [MAR Hold] levalbuterol, [MAR Hold] ondansetron (ZOFRAN) IV, [MAR Hold] ondansetron, [MAR Hold] phenol, [MAR Hold] promethazine **OR** [MAR Hold] promethazine (PHENERGAN) injection (IM or IVPB) **OR** [MAR Hold] promethazine   Vital Signs    Vitals:   09/14/22 2059 09/14/22 2352 09/15/22 0442 09/15/22 0829  BP: (!) 122/95 (!) 126/94 (!) 114/93 124/76  Pulse: 86 (!) 138 (!) 136 (!) 110  Resp: '18 18 18 17  '$ Temp: 98 F (36.7 C) 98.7 F (37.1 C) 98.1 F (36.7 C) 99.1 F (37.3 C)  TempSrc: Oral Oral Oral Oral  SpO2: 95% 96% 92%   Weight:      Height:        Intake/Output Summary (Last 24 hours) at 09/15/2022 1107 Last data filed at 09/15/2022 0445 Gross per 24 hour  Intake 240 ml  Output  700 ml  Net -460 ml   Filed Weights   09/11/22 1323  Weight: 87.5 kg    Telemetry    Atrial fibrillation heart rate in the 100-120s- Personally Reviewed  ECG    On admission atrial fibrillation 107- Personally Reviewed  Physical Exam   GEN: No acute distress.   Cardiac: Tachycardic irregularly irregular rhythm Respiratory: Mild central wheezing, predominantly clear to auscultation GI: Soft, nontender, non-distended  MS: No edema; No deformity. Neuro:  Nonfocal  Psych: Normal affect   Labs    Chemistry Recent Labs  Lab 09/13/22 0118 09/14/22 0135 09/15/22 0230  NA 132* 131* 135  K 3.6 4.3 3.5  CL 97* 96* 99  CO2 '25 25 25  '$ GLUCOSE 118* 115* 96  BUN 9 6* 8  CREATININE 0.90 0.86 1.11  CALCIUM 8.5* 8.7* 8.7*  PROT 6.0*  --   --   ALBUMIN 3.4*  --   --   AST 29  --   --   ALT 35  --   --   ALKPHOS 93  --   --   BILITOT 0.7  --   --   GFRNONAA >60 >60 >60  ANIONGAP '10 10 11     '$ Hematology Recent Labs  Lab 09/13/22 0118 09/14/22 0135 09/15/22 0230  WBC 12.4* 15.8* 15.2*  RBC 4.59 4.69 4.53  HGB 14.1 14.6 13.8  HCT 42.6 42.3 41.9  MCV 92.8 90.2 92.5  MCH 30.7 31.1 30.5  MCHC 33.1 34.5 32.9  RDW 13.0 12.7 12.8  PLT 210 201 208    Cardiac EnzymesNo results for input(s): "TROPONINI" in the last 168 hours. No results for input(s): "TROPIPOC" in the last 168 hours.   BNP Recent Labs  Lab 09/11/22 1632  BNP 225.9*     DDimer No results for input(s): "DDIMER" in the last 168 hours.   Radiology    No results found.  Cardiac Studies   Echo 09/12/2022 LVEF 40-45%, mild decrease left ventricular function, global hypokinesis, mild LVH, mild right ventricular enlargement with RVSP 21.3 mmHg, moderately dilated bilateral atria  Patient Profile     62 y.o. male presented in A-fib with RVR and remains in RVR on amiodarone gtt and metoprolol, pending DCCV today.  Sees Dr. Lovena Le  Assessment & Plan    Persistent atrial fibrillation - Previous  visits with Dr. Lovena Le, decided on rate control, discontinued flecainide.  Beta-blocker was increased and amlodipine was stopped.  PVCs been fairly reasonably controlled.  No known coronary artery disease but no prior stress test or catheterization. -[facility] and was found to be in A-fib RVR rate 160.  Was given IV diltiazem 20 mg by EMS. -Since rates were very challenging to control even up to the 170s, IV amiodarone was started.  It was felt that there was not much of a response to metoprolol and diltiazem.  Continue metoprolol to 75 mg 3 times a day to assist with rate control.  URI seems to be improving and his rates are as well although he is still tachycardic 100-120. -DC cardioversion today. patient has not missed any doses of his Xarelto.  -As outpatient, could consider ablation strategy.  Previously discussed on 08/14/2022 appointment with Dr. Lovena Le.  Plans to discuss this with Dr. Lovena Le on follow-up.   Chronic anticoagulation - Continue with Xarelto, has not missed doses.    History of Graves' disease - Prior TSH on 12/18 was 0.26 and free T4 was 1.39.   Hyperlipidemia - On simvastatin 40 mg.  Since he is now on amiodarone, we will change this over to Crestor 20 mg a day.   Viral respiratory illness --White count noted, stable at 15.2, remains afebrile. Serum Na back within normal limits   For questions or updates, please contact [place] Please consult [email or web address] for contact info under Cardiology/STEMI.      SignedJohny Blamer, DO  09/15/2022, 11:07 AM     Internal Medicine Resident, PGY-1 Pager# [phone]

## 2022-09-16 ENCOUNTER — Encounter (HOSPITAL_COMMUNITY): Payer: Self-pay | Admitting: Internal Medicine

## 2022-09-16 ENCOUNTER — Telehealth: Payer: Self-pay | Admitting: Internal Medicine

## 2022-09-16 NOTE — Telephone Encounter (Signed)
Patient was just discharged from the hospital yesterday, he wants to know how soon he can have cataract surgery.

## 2022-09-16 NOTE — Telephone Encounter (Signed)
Left message for patient to call back. Patient will need to have the doctor doing the surgery to send fax to our office for clearance.

## 2022-09-17 ENCOUNTER — Emergency Department (HOSPITAL_BASED_OUTPATIENT_CLINIC_OR_DEPARTMENT_OTHER): Payer: 59 | Admitting: Radiology

## 2022-09-17 ENCOUNTER — Encounter (HOSPITAL_BASED_OUTPATIENT_CLINIC_OR_DEPARTMENT_OTHER): Payer: Self-pay | Admitting: Emergency Medicine

## 2022-09-17 ENCOUNTER — Encounter (HOSPITAL_COMMUNITY): Payer: Self-pay

## 2022-09-17 ENCOUNTER — Inpatient Hospital Stay (HOSPITAL_BASED_OUTPATIENT_CLINIC_OR_DEPARTMENT_OTHER)
Admission: EM | Admit: 2022-09-17 | Discharge: 2022-09-23 | DRG: 871 | Disposition: A | Discharge status: Home / Self Care | Payer: 59 | Attending: Student | Admitting: Student

## 2022-09-17 ENCOUNTER — Other Ambulatory Visit: Payer: Self-pay

## 2022-09-17 DIAGNOSIS — Z7901 Long term (current) use of anticoagulants: Secondary | ICD-10-CM

## 2022-09-17 DIAGNOSIS — I4819 Other persistent atrial fibrillation: Secondary | ICD-10-CM | POA: Diagnosis present

## 2022-09-17 DIAGNOSIS — I1A0 Resistant hypertension: Secondary | ICD-10-CM | POA: Diagnosis present

## 2022-09-17 DIAGNOSIS — Z888 Allergy status to other drugs, medicaments and biological substances status: Secondary | ICD-10-CM

## 2022-09-17 DIAGNOSIS — F419 Anxiety disorder, unspecified: Secondary | ICD-10-CM | POA: Diagnosis present

## 2022-09-17 DIAGNOSIS — Z882 Allergy status to sulfonamides status: Secondary | ICD-10-CM

## 2022-09-17 DIAGNOSIS — J9601 Acute respiratory failure with hypoxia: Secondary | ICD-10-CM

## 2022-09-17 DIAGNOSIS — Z8249 Family history of ischemic heart disease and other diseases of the circulatory system: Secondary | ICD-10-CM

## 2022-09-17 DIAGNOSIS — J189 Pneumonia, unspecified organism: Secondary | ICD-10-CM

## 2022-09-17 DIAGNOSIS — A419 Sepsis, unspecified organism: Principal | ICD-10-CM | POA: Diagnosis present

## 2022-09-17 DIAGNOSIS — D509 Iron deficiency anemia, unspecified: Secondary | ICD-10-CM | POA: Insufficient documentation

## 2022-09-17 DIAGNOSIS — R9431 Abnormal electrocardiogram [ECG] [EKG]: Secondary | ICD-10-CM | POA: Insufficient documentation

## 2022-09-17 DIAGNOSIS — I10 Essential (primary) hypertension: Secondary | ICD-10-CM | POA: Diagnosis present

## 2022-09-17 DIAGNOSIS — Z79890 Hormone replacement therapy (postmenopausal): Secondary | ICD-10-CM

## 2022-09-17 DIAGNOSIS — D649 Anemia, unspecified: Secondary | ICD-10-CM | POA: Insufficient documentation

## 2022-09-17 DIAGNOSIS — E785 Hyperlipidemia, unspecified: Secondary | ICD-10-CM | POA: Diagnosis present

## 2022-09-17 DIAGNOSIS — Y95 Nosocomial condition: Secondary | ICD-10-CM

## 2022-09-17 DIAGNOSIS — Z66 Do not resuscitate: Secondary | ICD-10-CM | POA: Diagnosis present

## 2022-09-17 DIAGNOSIS — J029 Acute pharyngitis, unspecified: Secondary | ICD-10-CM | POA: Insufficient documentation

## 2022-09-17 DIAGNOSIS — Z818 Family history of other mental and behavioral disorders: Secondary | ICD-10-CM

## 2022-09-17 DIAGNOSIS — F17210 Nicotine dependence, cigarettes, uncomplicated: Secondary | ICD-10-CM | POA: Diagnosis present

## 2022-09-17 DIAGNOSIS — R110 Nausea: Secondary | ICD-10-CM | POA: Insufficient documentation

## 2022-09-17 DIAGNOSIS — E7800 Pure hypercholesterolemia, unspecified: Secondary | ICD-10-CM | POA: Diagnosis present

## 2022-09-17 DIAGNOSIS — R042 Hemoptysis: Secondary | ICD-10-CM | POA: Insufficient documentation

## 2022-09-17 DIAGNOSIS — I429 Cardiomyopathy, unspecified: Secondary | ICD-10-CM | POA: Diagnosis present

## 2022-09-17 DIAGNOSIS — Z811 Family history of alcohol abuse and dependence: Secondary | ICD-10-CM

## 2022-09-17 DIAGNOSIS — N400 Enlarged prostate without lower urinary tract symptoms: Secondary | ICD-10-CM | POA: Diagnosis present

## 2022-09-17 DIAGNOSIS — I482 Chronic atrial fibrillation, unspecified: Secondary | ICD-10-CM | POA: Diagnosis present

## 2022-09-17 DIAGNOSIS — Z885 Allergy status to narcotic agent status: Secondary | ICD-10-CM

## 2022-09-17 DIAGNOSIS — F17200 Nicotine dependence, unspecified, uncomplicated: Secondary | ICD-10-CM | POA: Insufficient documentation

## 2022-09-17 DIAGNOSIS — F39 Unspecified mood [affective] disorder: Secondary | ICD-10-CM | POA: Insufficient documentation

## 2022-09-17 DIAGNOSIS — Z1152 Encounter for screening for COVID-19: Secondary | ICD-10-CM

## 2022-09-17 DIAGNOSIS — Z8051 Family history of malignant neoplasm of kidney: Secondary | ICD-10-CM

## 2022-09-17 DIAGNOSIS — I5022 Chronic systolic (congestive) heart failure: Secondary | ICD-10-CM | POA: Diagnosis present

## 2022-09-17 DIAGNOSIS — E8809 Other disorders of plasma-protein metabolism, not elsewhere classified: Secondary | ICD-10-CM | POA: Diagnosis present

## 2022-09-17 DIAGNOSIS — E890 Postprocedural hypothyroidism: Secondary | ICD-10-CM | POA: Diagnosis present

## 2022-09-17 DIAGNOSIS — I110 Hypertensive heart disease with heart failure: Secondary | ICD-10-CM | POA: Diagnosis present

## 2022-09-17 DIAGNOSIS — I4892 Unspecified atrial flutter: Secondary | ICD-10-CM | POA: Diagnosis present

## 2022-09-17 DIAGNOSIS — E039 Hypothyroidism, unspecified: Secondary | ICD-10-CM | POA: Diagnosis present

## 2022-09-17 DIAGNOSIS — R6520 Severe sepsis without septic shock: Secondary | ICD-10-CM | POA: Insufficient documentation

## 2022-09-17 DIAGNOSIS — B001 Herpesviral vesicular dermatitis: Secondary | ICD-10-CM | POA: Diagnosis present

## 2022-09-17 DIAGNOSIS — Z79899 Other long term (current) drug therapy: Secondary | ICD-10-CM

## 2022-09-17 HISTORY — DX: Nosocomial condition: Y95

## 2022-09-17 HISTORY — DX: Pneumonia, unspecified organism: J18.9

## 2022-09-17 LAB — COMPREHENSIVE METABOLIC PANEL
ALT: 21 U/L (ref 0–44)
AST: 12 U/L — ABNORMAL LOW (ref 15–41)
Albumin: 4.3 g/dL (ref 3.5–5.0)
Alkaline Phosphatase: 96 U/L (ref 38–126)
Anion gap: 10 (ref 5–15)
BUN: 14 mg/dL (ref 8–23)
CO2: 29 mmol/L (ref 22–32)
Calcium: 9.2 mg/dL (ref 8.9–10.3)
Chloride: 100 mmol/L (ref 98–111)
Creatinine, Ser: 0.85 mg/dL (ref 0.61–1.24)
GFR, Estimated: >60 mL/min (ref >60)
Glucose, Bld: 111 mg/dL — ABNORMAL HIGH (ref 70–99)
Potassium: 4.1 mmol/L (ref 3.5–5.1)
Sodium: 139 mmol/L (ref 135–145)
Total Bilirubin: 0.8 mg/dL (ref 0.3–1.2)
Total Protein: 7.1 g/dL (ref 6.5–8.1)

## 2022-09-17 LAB — CBC WITH DIFFERENTIAL/PLATELET
Abs Immature Granulocytes: 0.12 10*3/uL — ABNORMAL HIGH (ref 0.00–0.07)
Basophils Absolute: 0 10*3/uL (ref 0.0–0.1)
Basophils Relative: 0 %
Eosinophils Absolute: 0 10*3/uL (ref 0.0–0.5)
Eosinophils Relative: 0 %
HCT: 45.3 % (ref 39.0–52.0)
Hemoglobin: 15 g/dL (ref 13.0–17.0)
Immature Granulocytes: 1 %
Lymphocytes Relative: 5 %
Lymphs Abs: 1.1 10*3/uL (ref 0.7–4.0)
MCH: 30.6 pg (ref 26.0–34.0)
MCHC: 33.1 g/dL (ref 30.0–36.0)
MCV: 92.4 fL (ref 80.0–100.0)
Monocytes Absolute: 1.6 10*3/uL — ABNORMAL HIGH (ref 0.1–1.0)
Monocytes Relative: 7 %
Neutro Abs: 20.6 10*3/uL — ABNORMAL HIGH (ref 1.7–7.7)
Neutrophils Relative %: 87 %
Platelets: 281 10*3/uL (ref 150–400)
RBC: 4.9 MIL/uL (ref 4.22–5.81)
RDW: 12.8 % (ref 11.5–15.5)
WBC: 23.4 10*3/uL — ABNORMAL HIGH (ref 4.0–10.5)
nRBC: 0 % (ref 0.0–0.2)

## 2022-09-17 LAB — RESP PANEL BY RT-PCR (RSV, FLU A&B, COVID)  RVPGX2
Influenza A by PCR: NEGATIVE
Influenza B by PCR: NEGATIVE
Resp Syncytial Virus by PCR: NEGATIVE
SARS Coronavirus 2 by RT PCR: NEGATIVE

## 2022-09-17 LAB — LACTIC ACID, PLASMA: Lactic Acid, Venous: 1.2 mmol/L (ref 0.5–1.9)

## 2022-09-17 LAB — PROTIME-INR
INR: 1.4 — ABNORMAL HIGH (ref 0.8–1.2)
Prothrombin Time: 17.2 seconds — ABNORMAL HIGH (ref 11.4–15.2)

## 2022-09-17 MED ORDER — VANCOMYCIN HCL IN DEXTROSE 1-5 GM/200ML-% IV SOLN
1000.0000 mg | Freq: Two times a day (BID) | INTRAVENOUS | Status: DC
  Administered 2022-09-18 – 2022-09-21 (×7): 1000 mg via INTRAVENOUS
  Filled 2022-09-17 (×8): qty 200

## 2022-09-17 MED ORDER — SODIUM CHLORIDE 0.9 % IV SOLN: 2.0000 g | Freq: Once | INTRAVENOUS | Status: DC

## 2022-09-17 MED ORDER — SODIUM CHLORIDE 0.9 % IV SOLN
2.0000 g | Freq: Three times a day (TID) | INTRAVENOUS | Status: DC
  Administered 2022-09-17 – 2022-09-20 (×8): 2 g via INTRAVENOUS
  Filled 2022-09-17 (×8): qty 12.5

## 2022-09-17 MED ORDER — VANCOMYCIN HCL IN DEXTROSE 1-5 GM/200ML-% IV SOLN
1000.0000 mg | Freq: Once | INTRAVENOUS | Status: AC
  Administered 2022-09-17: 1000 mg via INTRAVENOUS
  Filled 2022-09-17: qty 200

## 2022-09-17 MED ORDER — ACETAMINOPHEN 325 MG PO TABS
650.0000 mg | ORAL_TABLET | Freq: Once | ORAL | Status: AC
  Administered 2022-09-17: 650 mg via ORAL
  Filled 2022-09-17: qty 2

## 2022-09-17 MED ORDER — VANCOMYCIN HCL IN DEXTROSE 1-5 GM/200ML-% IV SOLN: 1000.0000 mg | Freq: Once | INTRAVENOUS | Status: DC

## 2022-09-17 MED ORDER — IPRATROPIUM-ALBUTEROL 0.5-2.5 (3) MG/3ML IN SOLN
3.0000 mL | Freq: Once | RESPIRATORY_TRACT | Status: AC
  Administered 2022-09-17: 3 mL via RESPIRATORY_TRACT
  Filled 2022-09-17: qty 3

## 2022-09-17 MED ORDER — VANCOMYCIN HCL IN DEXTROSE 1-5 GM/200ML-% IV SOLN
1000.0000 mg | Freq: Once | INTRAVENOUS | Status: AC
  Administered 2022-09-17: 1000 mg via INTRAVENOUS

## 2022-09-17 MED ORDER — ALBUTEROL (5 MG/ML) CONTINUOUS INHALATION SOLN: 10.0000 mg/h | INHALATION_SOLUTION | Freq: Once | RESPIRATORY_TRACT | Status: DC

## 2022-09-17 MED ORDER — LACTATED RINGERS IV SOLN
INTRAVENOUS | Status: AC
  Administered 2022-09-17: 500 mL/h via INTRAVENOUS

## 2022-09-17 NOTE — Progress Notes (Signed)
Pharmacy Antibiotic Note  Jesse LEMBKE [name] is a 62 y.o. male admitted on 09/17/2022 with pneumonia.  Pharmacy has been consulted for cefepime and vancomycin dosing.  Plan: Start cefepime 2g every 8 hours.  Give IV Vancomycin '2000mg'$  x 1 for loading dose, followed by IV Vancomycin 1000 every 12 hours for eAUC405.  Follow culture data for de-escalation.  Monitor renal function for dose adjustments as indicated.   Temp (24hrs), Avg:98.2 F (36.8 C), Min:98.2 F (36.8 C), Max:98.2 F (36.8 C)  Recent Labs  Lab 09/12/22 0142 09/13/22 0118 09/14/22 0135 09/15/22 0230 09/17/22 1641  WBC 10.9* 12.4* 15.8* 15.2* 23.4*  CREATININE 0.95 0.90 0.86 1.11 0.85    Estimated Creatinine Clearance: 97.4 mL/min (by C-G formula based on SCr of 0.85 mg/dL).    Allergies  Allergen Reactions   Sulfa Drugs Cross Reactors Hives   Codeine Other (See Comments)    Made him very jittery    Hydrochlorothiazide Other (See Comments)    Hyponatremia   Thank you for allowing pharmacy to be a part of this patient's care.  Esmeralda Arthur, PharmD, BCCCP  09/17/2022 8:15 PM

## 2022-09-17 NOTE — Telephone Encounter (Signed)
Pt called back left message to have Surgeon to fax paperwork for cardiology clearance to: [phone], Sherlie Ban RN.    Pt also received [facility] telephone number if they have questions.  Waiting for documentation or telephone call...follow up required.

## 2022-09-17 NOTE — ED Provider Notes (Signed)
[place] EMERGENCY DEPT Provider Note   CSN: [id number] Arrival date & time: 09/17/22  1633     History  Chief Complaint  Patient presents with   Hemoptysis    Jesse GENDREAU [name] is a 63 y.o. male.  HPI     62 year old male comes in with chief complaint of hemoptysis.  Patient has history of A-fib and was recently diagnosed with rhinovirus.  He was discharged from the hospital on 1-2.  He had a cough at the time of discharge, but it was not severe.  He started having worsening cough yesterday and today he started noticing hemoptysis.  Patient also indicates having some shortness of breath with exertion.  He describes his phlegm as streaks of blood.  There is no history of PE, DVT.  Home Medications Prior to Admission medications   Medication Sig Start Date End Date Taking? Authorizing Provider  amiodarone (PACERONE) 200 MG tablet Take 1 tablet (200 mg total) by mouth 2 (two) times daily for 14 days, THEN 1 tablet (200 mg total) daily. Follow up with cardiology for refills. 09/15/22 10/29/22  Elodia Florence., MD  Cholecalciferol (VITAMIN D-3) 125 MCG (5000 UT) TABS Take 5,000 Units by mouth daily.    [provider]  cloNIDine (CATAPRES - DOSED IN MG/24 HR) 0.1 mg/24hr patch APPLY 1 PATCH ONCE A WEEK 07/24/22   [name], MD  fluticasone [facility]) 50 MCG/ACT nasal spray Place 2 sprays into both nostrils daily as needed for allergies.    [provider]  levothyroxine (SYNTHROID) 200 MCG tablet Take 1 tablet (200 mcg total) by mouth daily. Take as recommended by endocrine.  200 mg daily except for Sunday, take 100 mg. 09/15/22   Elodia Florence., MD  lisinopril (ZESTRIL) 40 MG tablet TAKE 1 TABLET BY MOUTH EVERY DAY 05/13/22   Evans Lance, MD  metoprolol tartrate (LOPRESSOR) 100 MG tablet Take 1 tablet (100 mg total) by mouth 2 (two) times daily. 09/15/22 11/14/22  Elodia Florence., MD  montelukast (SINGULAIR) 10 MG tablet TAKE 1  TABLET BY MOUTH EVERYDAY AT BEDTIME Patient taking differently: Take 10 mg by mouth at bedtime. 04/01/22   Maximiano Coss, NP  ofloxacin (OCUFLOX) 0.3 % ophthalmic solution Place 1 drop into the right eye 4 (four) times daily.    [provider]  prednisoLONE acetate (PRED FORTE) 1 % ophthalmic suspension Place 1 drop into the right eye 4 (four) times daily.    [provider]  rivaroxaban (XARELTO) 20 MG TABS tablet Take 1 tablet (20 mg total) by mouth daily with supper. 08/14/22   Evans Lance, MD  rosuvastatin (CRESTOR) 20 MG tablet Take 1 tablet (20 mg total) by mouth daily. 09/16/22 11/15/22  Elodia Florence., MD  sertraline (ZOLOFT) 50 MG tablet Take 1 tablet (50 mg total) by mouth daily. Patient taking differently: Take 50 mg by mouth 3 (three) times a week. 07/08/22   [name], MD  tamsulosin (FLOMAX) 0.4 MG CAPS capsule TAKE 2 CAPSULES BY MOUTH EVERY DAY 04/24/22   Wendie Agreste, MD      Allergies    Sulfa drugs cross reactors, Codeine, and Hydrochlorothiazide    Review of Systems   Review of Systems  All other systems reviewed and are negative.   Physical Exam Updated Vital Signs BP 117/62 (BP Location: Right Arm)   Pulse 68   Temp 98.2 F (36.8 C) (Oral)   Resp (!) 24  SpO2 96%  Physical Exam Vitals and nursing note reviewed.  Constitutional:      Appearance: He is well-developed.  HENT:     Head: Atraumatic.  Cardiovascular:     Rate and Rhythm: Normal rate.  Pulmonary:     Effort: Pulmonary effort is normal.     Breath sounds: No wheezing or rhonchi.  Musculoskeletal:     Cervical back: Neck supple.  Skin:    General: Skin is warm.  Neurological:     Mental Status: He is alert and oriented to person, place, and time.     ED Results / Procedures / Treatments   Labs (all labs ordered are listed, but only abnormal results are displayed) Labs Reviewed  CBC WITH DIFFERENTIAL/PLATELET - Abnormal; Notable for the following  components:      Result Value   WBC 23.4 (*)    Neutro Abs 20.6 (*)    Monocytes Absolute 1.6 (*)    Abs Immature Granulocytes 0.12 (*)    All other components within normal limits  COMPREHENSIVE METABOLIC PANEL - Abnormal; Notable for the following components:   Glucose, Bld 111 (*)    AST 12 (*)    All other components within normal limits  RESP PANEL BY RT-PCR (RSV, FLU A&B, COVID)  RVPGX2  CULTURE, BLOOD (ROUTINE X 2)  CULTURE, BLOOD (ROUTINE X 2)  LACTIC ACID, PLASMA  LACTIC ACID, PLASMA  PROTIME-INR    EKG EKG Interpretation  Date/Time:  Thursday September 17 2022 16:47:39 EST Ventricular Rate:  71 PR Interval:  114 QRS Duration: 92 QT Interval:  430 QTC Calculation: 467 R Axis:   100 Text Interpretation: Normal sinus rhythm Rightward axis Borderline ECG When compared with ECG of 15-Sep-2022 11:47, No significant change was found No acute changes No significant change since last tracing Confirmed by Varney Biles [phone]) on 09/17/2022 7:00:30 PM  Radiology DG Chest 2 View  Result Date: 09/17/2022 CLINICAL DATA:  Worsening cough and shortness of breath. Reports coughing up blood this morning. EXAM: CHEST - 2 VIEW COMPARISON:  Radiograph 09/11/2022 FINDINGS: New area of airspace consolidation in the right upper lobe. Small area of consolidation in the medial left lung base. Unchanged elevation of right hemidiaphragm. Stable heart size and mediastinal contours. No pneumothorax or large pleural effusion. IMPRESSION: New area of airspace consolidation in the right upper lobe and medial left lung base, suspicious for pneumonia. Electronically Signed   By: Keith Rake M.D.   On: 09/17/2022 17:48    Procedures .Critical Care  Performed by: Varney Biles, MD Authorized by: Varney Biles, MD   Critical care provider statement:    Critical care time (minutes):  36   Critical care was necessary to treat or prevent imminent or life-threatening deterioration of the following  conditions:  Sepsis and respiratory failure   Critical care was time spent personally by me on the following activities:  Development of treatment plan with patient or surrogate, discussions with consultants, evaluation of patient's response to treatment, examination of patient, ordering and review of laboratory studies, ordering and review of radiographic studies, ordering and performing treatments and interventions, pulse oximetry, re-evaluation of patient's condition and review of old charts     Medications Ordered in ED Medications  lactated ringers infusion (has no administration in time range)  ceFEPIme (MAXIPIME) 2 g in sodium chloride 0.9 % 100 mL IVPB (has no administration in time range)  vancomycin (VANCOCIN) IVPB 1000 mg/200 mL premix (has no administration in time range)  And  vancomycin (VANCOCIN) IVPB 1000 mg/200 mL premix (has no administration in time range)  vancomycin (VANCOCIN) IVPB 1000 mg/200 mL premix (has no administration in time range)  ipratropium-albuterol (DUONEB) 0.5-2.5 (3) MG/3ML nebulizer solution 3 mL (has no administration in time range)    ED Course/ Medical Decision Making/ A&P                           Medical Decision Making Problems Addressed: Acute respiratory failure with hypoxia [facility]): complicated acute illness or injury with systemic symptoms HAP (hospital-acquired pneumonia): complicated acute illness or injury with systemic symptoms  Amount and/or Complexity of Data Reviewed Labs: ordered.  Risk Prescription drug management. Decision regarding hospitalization.   This patient presents to the ED with chief complaint(s) of worsening cough, pleuritic chest pain, hemoptysis with pertinent past medical history of recent admission to the hospital for A-fib with RVR and discharged 2 days ago.The complaint involves an extensive differential diagnosis and also carries with it a high risk of complications and morbidity.    It appears that patient  had a mild cough and URI that was found to be secondary to rhinovirus at the time of admission. After discharge however, his cough is worsened.  Now he is having hemoptysis. On exam patient has generalized wheezing.  He states that he has been told he has COPD.  He smokes about a pack a day.  No history of lung cancer.  No history of PE, DVT and no risk factors for the same besides recent admission to the hospital.  Patient is already taking anticoagulation, which does lower the risk of PE.  The differential diagnosis includes pneumonia, bronchitis, bronchiolitis, PE, pulmonary hemorrhage.  Basic labs and chest x-ray were ordered. Patient is wheezing, therefore we will give him DuoNeb treatment.  Additional history obtained: Records reviewed previous admission documents  Independent labs interpretation:  The following labs were independently interpreted: White count shows elevated white count.  Independent visualization and interpretation of imaging: - I independently visualized the following imaging with scope of interpretation limited to determining acute life threatening conditions related to emergency care: X-ray of the chest, which revealed clear evidence of right-sided infiltrate.  I have reviewed the x-ray from his admission earlier this month.  He did not have pulmonary opacity at that time.  Treatment and Reassessment: Patient noted to be tachypneic and has elevated white count and diffuse wheezing. He has COPD and has hemoptysis. He has x-ray with new infiltrate. Since his pneumonia has been diagnosed 48 hours after admission, we will treat him for hospital-acquired pneumonia.  Patient has MDR and MRSA risk factors as well given the recent admission to the hospital and structural lung disease.  My suspicion for cancer is low, especially since we have a new infiltrate that was not present just few days ago.  Return of the oxygen, patient's O2 sats dropped to 84%.  We will proceed  with admission to the hospital.  Final Clinical Impression(s) / ED Diagnoses Final diagnoses:  Acute respiratory failure with hypoxia (HCC)  HAP (hospital-acquired pneumonia)    Rx / DC Orders ED Discharge Orders          Ordered    US Venous Img Lower Bilateral        09/17/22 1933              Varney Biles, MD 09/17/22 2049

## 2022-09-17 NOTE — Sepsis Progress Note (Signed)
Following per sepsis protocol   

## 2022-09-17 NOTE — ED Triage Notes (Signed)
Pt arrived via EMS. Per EMS, pt called for cough x1 week. Pt was recently hospitalized for afib RVR and Rhinovirus + while in hospital. Pt states he has been coughing x1 week but began coughing up blood this morning. EMS reports SpO2 of 93% on RA and had pt on O2 via [place]. Pt caox4 also c/o headache.   EMS VS BP 179/99 HR 72  SpO2 93% RA , 95% SpO2 O2 [place] 2L NSR

## 2022-09-17 NOTE — ED Provider Triage Note (Signed)
Emergency Medicine Provider Triage Evaluation Note  Jesse Europe [name] , a 62 y.o. male  was evaluated in triage.  Pt complains of cough and shortness of breath.  Patient was admitted to the hospital 09/11/2022-09/15/22 for A-fib with RVR and rhinovirus infection.  Patient states that his shortness of breath and cough became worse over the past day or 2.  Transported by EMS today.    Review of Systems  Positive: Shortness of breath, cough Negative: Fevers  Physical Exam  BP (!) 157/81 (BP Location: Left Arm)   Pulse 76   Temp 98.2 F (36.8 C)   Resp (!) 30   SpO2 92%  Gen:   Awake, no distress   Resp:  Normal effort, globally diminished lung sounds, scant expiratory wheezing MSK:   Moves extremities without difficulty  Other:    Medical Decision Making  Medically screening exam initiated at 4:41 PM.  Appropriate orders placed.  Jesse Europe [name] was informed that the remainder of the evaluation will be completed by another provider, this initial triage assessment does not replace that evaluation, and the importance of remaining in the ED until their evaluation is complete.     Carlisle Cater, PA-C 09/17/22 1643

## 2022-09-17 NOTE — Plan of Care (Signed)
   Patient Name: roosevelt, eimers DOB: 11-09-1960 MRN: [id number] Transferring facility: [facility] Requesting provider: nanavati, md Reason for transfer:  RUL pneumonia, hypoxia 62 yo WM with hx of afib, HTN. discharged on 1-2-204 for afib. had cough today and mild hemoptysis. on xarelto. hypoxic in ER. started on HAP coverage due to recent hospitalization(cefepime/vanco).  Going to: [facility] Admission Status: observation Bed Type: med/tele To Do:  [facility] will assume care on arrival to accepting facility. Until arrival, medical decision making responsibilities remain with the EDP.  However, [facility] available 24/7 for questions and assistance.   Nursing staff please page [place] and Consults [phone]) as soon as the patient arrives to the hospital.  Kristopher Oppenheim, DO [facility]

## 2022-09-18 ENCOUNTER — Other Ambulatory Visit: Payer: Self-pay

## 2022-09-18 DIAGNOSIS — I480 Paroxysmal atrial fibrillation: Secondary | ICD-10-CM | POA: Diagnosis not present

## 2022-09-18 DIAGNOSIS — I110 Hypertensive heart disease with heart failure: Secondary | ICD-10-CM | POA: Diagnosis present

## 2022-09-18 DIAGNOSIS — A419 Sepsis, unspecified organism: Secondary | ICD-10-CM | POA: Diagnosis present

## 2022-09-18 DIAGNOSIS — I482 Chronic atrial fibrillation, unspecified: Secondary | ICD-10-CM | POA: Diagnosis present

## 2022-09-18 DIAGNOSIS — R6520 Severe sepsis without septic shock: Secondary | ICD-10-CM | POA: Diagnosis present

## 2022-09-18 DIAGNOSIS — I5022 Chronic systolic (congestive) heart failure: Secondary | ICD-10-CM | POA: Diagnosis present

## 2022-09-18 DIAGNOSIS — Z79899 Other long term (current) drug therapy: Secondary | ICD-10-CM | POA: Diagnosis not present

## 2022-09-18 DIAGNOSIS — J189 Pneumonia, unspecified organism: Secondary | ICD-10-CM

## 2022-09-18 DIAGNOSIS — F17210 Nicotine dependence, cigarettes, uncomplicated: Secondary | ICD-10-CM | POA: Diagnosis present

## 2022-09-18 DIAGNOSIS — E890 Postprocedural hypothyroidism: Secondary | ICD-10-CM | POA: Diagnosis present

## 2022-09-18 DIAGNOSIS — N400 Enlarged prostate without lower urinary tract symptoms: Secondary | ICD-10-CM | POA: Diagnosis present

## 2022-09-18 DIAGNOSIS — R042 Hemoptysis: Secondary | ICD-10-CM | POA: Diagnosis present

## 2022-09-18 DIAGNOSIS — J9601 Acute respiratory failure with hypoxia: Secondary | ICD-10-CM | POA: Insufficient documentation

## 2022-09-18 DIAGNOSIS — I4892 Unspecified atrial flutter: Secondary | ICD-10-CM | POA: Diagnosis present

## 2022-09-18 DIAGNOSIS — F419 Anxiety disorder, unspecified: Secondary | ICD-10-CM | POA: Diagnosis present

## 2022-09-18 DIAGNOSIS — B001 Herpesviral vesicular dermatitis: Secondary | ICD-10-CM | POA: Diagnosis present

## 2022-09-18 DIAGNOSIS — I429 Cardiomyopathy, unspecified: Secondary | ICD-10-CM | POA: Diagnosis present

## 2022-09-18 DIAGNOSIS — Z1152 Encounter for screening for COVID-19: Secondary | ICD-10-CM | POA: Diagnosis not present

## 2022-09-18 DIAGNOSIS — D509 Iron deficiency anemia, unspecified: Secondary | ICD-10-CM | POA: Insufficient documentation

## 2022-09-18 DIAGNOSIS — E8809 Other disorders of plasma-protein metabolism, not elsewhere classified: Secondary | ICD-10-CM | POA: Diagnosis present

## 2022-09-18 DIAGNOSIS — Y95 Nosocomial condition: Secondary | ICD-10-CM | POA: Diagnosis not present

## 2022-09-18 DIAGNOSIS — F39 Unspecified mood [affective] disorder: Secondary | ICD-10-CM | POA: Diagnosis present

## 2022-09-18 DIAGNOSIS — Z882 Allergy status to sulfonamides status: Secondary | ICD-10-CM | POA: Diagnosis not present

## 2022-09-18 DIAGNOSIS — I4819 Other persistent atrial fibrillation: Secondary | ICD-10-CM | POA: Diagnosis present

## 2022-09-18 DIAGNOSIS — D649 Anemia, unspecified: Secondary | ICD-10-CM | POA: Insufficient documentation

## 2022-09-18 DIAGNOSIS — I10 Essential (primary) hypertension: Secondary | ICD-10-CM | POA: Diagnosis not present

## 2022-09-18 DIAGNOSIS — Z885 Allergy status to narcotic agent status: Secondary | ICD-10-CM | POA: Diagnosis not present

## 2022-09-18 DIAGNOSIS — Z66 Do not resuscitate: Secondary | ICD-10-CM | POA: Diagnosis present

## 2022-09-18 HISTORY — DX: Acute respiratory failure with hypoxia: J96.01

## 2022-09-18 LAB — CBC WITH DIFFERENTIAL/PLATELET
Abs Immature Granulocytes: 0.1 10*3/uL — ABNORMAL HIGH (ref 0.00–0.07)
Basophils Absolute: 0.1 10*3/uL (ref 0.0–0.1)
Basophils Relative: 0 %
Eosinophils Absolute: 0.1 10*3/uL (ref 0.0–0.5)
Eosinophils Relative: 0 %
HCT: 37 % — ABNORMAL LOW (ref 39.0–52.0)
Hemoglobin: 12.3 g/dL — ABNORMAL LOW (ref 13.0–17.0)
Immature Granulocytes: 1 %
Lymphocytes Relative: 11 %
Lymphs Abs: 2.2 10*3/uL (ref 0.7–4.0)
MCH: 30.4 pg (ref 26.0–34.0)
MCHC: 33.2 g/dL (ref 30.0–36.0)
MCV: 91.6 fL (ref 80.0–100.0)
Monocytes Absolute: 2.1 10*3/uL — ABNORMAL HIGH (ref 0.1–1.0)
Monocytes Relative: 11 %
Neutro Abs: 15.9 10*3/uL — ABNORMAL HIGH (ref 1.7–7.7)
Neutrophils Relative %: 77 %
Platelets: 212 10*3/uL (ref 150–400)
RBC: 4.04 MIL/uL — ABNORMAL LOW (ref 4.22–5.81)
RDW: 13 % (ref 11.5–15.5)
WBC: 20.3 10*3/uL — ABNORMAL HIGH (ref 4.0–10.5)
nRBC: 0 % (ref 0.0–0.2)

## 2022-09-18 LAB — BASIC METABOLIC PANEL
Anion gap: 8 (ref 5–15)
BUN: 10 mg/dL (ref 8–23)
CO2: 29 mmol/L (ref 22–32)
Calcium: 8.1 mg/dL — ABNORMAL LOW (ref 8.9–10.3)
Chloride: 100 mmol/L (ref 98–111)
Creatinine, Ser: 0.65 mg/dL (ref 0.61–1.24)
GFR, Estimated: >60 mL/min (ref >60)
Glucose, Bld: 102 mg/dL — ABNORMAL HIGH (ref 70–99)
Potassium: 3.5 mmol/L (ref 3.5–5.1)
Sodium: 137 mmol/L (ref 135–145)

## 2022-09-18 LAB — LACTIC ACID, PLASMA
Lactic Acid, Venous: 0.8 mmol/L (ref 0.5–1.9)
Lactic Acid, Venous: 1.5 mmol/L (ref 0.5–1.9)

## 2022-09-18 LAB — CBG MONITORING, ED: Glucose-Capillary: 115 mg/dL — ABNORMAL HIGH (ref 70–99)

## 2022-09-18 MED ORDER — MONTELUKAST SODIUM 10 MG PO TABS
10.0000 mg | ORAL_TABLET | Freq: Every day | ORAL | Status: DC
  Administered 2022-09-18 – 2022-09-22 (×5): 10 mg via ORAL
  Filled 2022-09-18 (×5): qty 1

## 2022-09-18 MED ORDER — TAMSULOSIN HCL 0.4 MG PO CAPS
0.8000 mg | ORAL_CAPSULE | Freq: Every day | ORAL | Status: DC
  Administered 2022-09-18 – 2022-09-23 (×6): 0.8 mg via ORAL
  Filled 2022-09-18 (×6): qty 2

## 2022-09-18 MED ORDER — LEVOTHYROXINE SODIUM 100 MCG PO TABS
200.0000 ug | ORAL_TABLET | Freq: Every day | ORAL | Status: DC
  Administered 2022-09-18: 200 ug via ORAL
  Filled 2022-09-18: qty 2

## 2022-09-18 MED ORDER — LEVOTHYROXINE SODIUM 100 MCG PO TABS
200.0000 ug | ORAL_TABLET | ORAL | Status: DC
  Administered 2022-09-19 – 2022-09-23 (×4): 200 ug via ORAL
  Filled 2022-09-18 (×4): qty 2

## 2022-09-18 MED ORDER — LEVALBUTEROL HCL 0.63 MG/3ML IN NEBU: 0.6300 mg | INHALATION_SOLUTION | Freq: Three times a day (TID) | RESPIRATORY_TRACT | Status: DC | PRN

## 2022-09-18 MED ORDER — ONDANSETRON HCL 4 MG/2ML IJ SOLN
4.0000 mg | Freq: Once | INTRAMUSCULAR | Status: AC
  Administered 2022-09-18: 4 mg via INTRAVENOUS
  Filled 2022-09-18: qty 2

## 2022-09-18 MED ORDER — AMIODARONE HCL 200 MG PO TABS: 200.0000 mg | ORAL_TABLET | Freq: Every day | ORAL | Status: DC

## 2022-09-18 MED ORDER — PREDNISOLONE ACETATE 1 % OP SUSP
1.0000 [drp] | Freq: Four times a day (QID) | OPHTHALMIC | Status: DC
  Administered 2022-09-18 – 2022-09-23 (×18): 1 [drp] via OPHTHALMIC
  Filled 2022-09-18: qty 5

## 2022-09-18 MED ORDER — AMIODARONE LOAD VIA INFUSION
150.0000 mg | Freq: Once | INTRAVENOUS | Status: AC
  Administered 2022-09-18: 150 mg via INTRAVENOUS
  Filled 2022-09-18: qty 83.34

## 2022-09-18 MED ORDER — ACETAMINOPHEN 325 MG PO TABS
650.0000 mg | ORAL_TABLET | Freq: Once | ORAL | Status: AC
  Administered 2022-09-18: 650 mg via ORAL
  Filled 2022-09-18: qty 2

## 2022-09-18 MED ORDER — ACETAMINOPHEN 650 MG RE SUPP: 650.0000 mg | Freq: Four times a day (QID) | RECTAL | Status: DC | PRN

## 2022-09-18 MED ORDER — LEVOTHYROXINE SODIUM 100 MCG PO TABS
200.0000 ug | ORAL_TABLET | ORAL | Status: DC
  Administered 2022-09-20: 200 ug via ORAL
  Filled 2022-09-18: qty 2

## 2022-09-18 MED ORDER — OFLOXACIN 0.3 % OP SOLN
1.0000 [drp] | Freq: Four times a day (QID) | OPHTHALMIC | Status: DC
  Administered 2022-09-18 – 2022-09-23 (×18): 1 [drp] via OPHTHALMIC
  Filled 2022-09-18: qty 5

## 2022-09-18 MED ORDER — CLONIDINE HCL 0.1 MG/24HR TD PTWK
0.1000 mg | MEDICATED_PATCH | TRANSDERMAL | Status: DC
  Filled 2022-09-18: qty 1

## 2022-09-18 MED ORDER — AMIODARONE HCL 200 MG PO TABS: 200.0000 mg | ORAL_TABLET | Freq: Two times a day (BID) | ORAL | Status: DC

## 2022-09-18 MED ORDER — SERTRALINE HCL 50 MG PO TABS
50.0000 mg | ORAL_TABLET | Freq: Every day | ORAL | Status: DC
  Administered 2022-09-18 – 2022-09-23 (×6): 50 mg via ORAL
  Filled 2022-09-18: qty 1
  Filled 2022-09-18: qty 2
  Filled 2022-09-18 (×4): qty 1

## 2022-09-18 MED ORDER — METOPROLOL TARTRATE 100 MG PO TABS
100.0000 mg | ORAL_TABLET | Freq: Two times a day (BID) | ORAL | Status: DC
  Administered 2022-09-18 (×2): 100 mg via ORAL
  Filled 2022-09-18 (×2): qty 4

## 2022-09-18 MED ORDER — IPRATROPIUM-ALBUTEROL 0.5-2.5 (3) MG/3ML IN SOLN
3.0000 mL | RESPIRATORY_TRACT | Status: DC | PRN
  Administered 2022-09-18 (×2): 3 mL via RESPIRATORY_TRACT
  Filled 2022-09-18 (×2): qty 3

## 2022-09-18 MED ORDER — LISINOPRIL 20 MG PO TABS
40.0000 mg | ORAL_TABLET | Freq: Every day | ORAL | Status: DC
  Administered 2022-09-18: 40 mg via ORAL
  Filled 2022-09-18: qty 4

## 2022-09-18 MED ORDER — METOPROLOL TARTRATE 5 MG/5ML IV SOLN
5.0000 mg | Freq: Once | INTRAVENOUS | Status: AC
  Administered 2022-09-18: 5 mg via INTRAVENOUS
  Filled 2022-09-18: qty 5

## 2022-09-18 MED ORDER — ACETAMINOPHEN 325 MG PO TABS
650.0000 mg | ORAL_TABLET | Freq: Four times a day (QID) | ORAL | Status: DC | PRN
  Administered 2022-09-18 – 2022-09-23 (×10): 650 mg via ORAL
  Filled 2022-09-18 (×10): qty 2

## 2022-09-18 MED ORDER — RIVAROXABAN 20 MG PO TABS
20.0000 mg | ORAL_TABLET | Freq: Every day | ORAL | Status: DC
  Administered 2022-09-18: 20 mg via ORAL
  Filled 2022-09-18: qty 1

## 2022-09-18 MED ORDER — SODIUM CHLORIDE 0.9 % IV SOLN: INTRAVENOUS | Status: AC

## 2022-09-18 MED ORDER — MELATONIN 5 MG PO TABS
5.0000 mg | ORAL_TABLET | Freq: Every evening | ORAL | Status: DC | PRN
  Administered 2022-09-18 – 2022-09-22 (×2): 5 mg via ORAL
  Filled 2022-09-18 (×2): qty 1

## 2022-09-18 MED ORDER — ACETAMINOPHEN 500 MG PO TABS
1000.0000 mg | ORAL_TABLET | Freq: Once | ORAL | Status: AC | PRN
  Administered 2022-09-18: 1000 mg via ORAL
  Filled 2022-09-18: qty 2

## 2022-09-18 MED ORDER — AMIODARONE HCL IN DEXTROSE 360-4.14 MG/200ML-% IV SOLN
60.0000 mg/h | INTRAVENOUS | Status: AC
  Administered 2022-09-18: 60 mg/h via INTRAVENOUS
  Filled 2022-09-18 (×2): qty 200

## 2022-09-18 MED ORDER — AMIODARONE HCL 200 MG PO TABS
200.0000 mg | ORAL_TABLET | Freq: Two times a day (BID) | ORAL | Status: DC
  Administered 2022-09-18 (×2): 200 mg via ORAL
  Filled 2022-09-18 (×2): qty 1

## 2022-09-18 MED ORDER — ROSUVASTATIN CALCIUM 20 MG PO TABS
20.0000 mg | ORAL_TABLET | Freq: Every day | ORAL | Status: DC
  Administered 2022-09-19 – 2022-09-23 (×5): 20 mg via ORAL
  Filled 2022-09-18 (×6): qty 1

## 2022-09-18 MED ORDER — AMIODARONE HCL IN DEXTROSE 360-4.14 MG/200ML-% IV SOLN
30.0000 mg/h | INTRAVENOUS | Status: DC
  Administered 2022-09-19 – 2022-09-21 (×5): 30 mg/h via INTRAVENOUS
  Filled 2022-09-18 (×5): qty 200

## 2022-09-18 NOTE — ED Notes (Signed)
Pt reports feeling of weakness.  Denies CP or SOB.

## 2022-09-18 NOTE — ED Notes (Signed)
Called [facility] and spoke to [place]; informed that the patient Bed Assignment is ready

## 2022-09-18 NOTE — ED Notes (Signed)
RT educated pt on smoking cessation at this time. Pt respiratory status stable on [place] 3 Lpm w/no distress noted at this time. Pt states that his chest feels tight a lot even when he is not sick. RT suggested pt seek appt w/pulmonology for further evaluation of respiratory symptoms considering his smoking hx and symptoms.

## 2022-09-18 NOTE — H&P (Signed)
History and Physical    Jesse FICK [name] ZOX:[id number] DOB: 1961/03/02 DOA: 09/17/2022  PCP: Pcp, No  Patient coming from: [place] [facility]  Chief Complaint: Cough  HPI: Jesse STAMMER [name] is a 62 y.o. male with medical history significant of Graves' disease status post radioactive iodine ablation, hypertension, hyperlipidemia, HFmrEF, paroxysmal A-fib (on Xarelto, metoprolol, amiodarone).  Recently admitted 12/29-1/2 for A-fib with RVR in the setting of rhinovirus infection and underwent cardioversion.  Patient returned to the ED yesterday via EMS complaining of cough, shortness of breath, and mild hemoptysis.  Oxygen saturation 84% on room air in the ED and placed on 3 L O2.  Afebrile.  Blood pressure low with systolic in the 81X.  Labs significant for WBC 23.4> 20.3, hemoglobin 15.0> 12.3, platelet count 281k> 212k, calcium 8.1 (mildly low on previous labs as well), albumin 4.3, COVID/influenza/RSV PCR negative, blood cultures drawn, and lactic acid normal x 3.  Chest x-ray showing new area of airspace consolidation in the right upper lobe and medial left lung base suspicious for pneumonia. Started on HAP coverage due to recent hospitalization (vancomycin and cefepime).  Patient was also given Tylenol, DuoNeb, Synthroid, lisinopril, IV and oral metoprolol, Zofran, Xarelto, Zoloft, Flomax, and IV fluids. While in the ED waiting for a hospital bed, patient went into A-fib with RVR with rate in the 150s and was started on amiodarone drip.  ED physician had discussed the case with Dr. Percival Spanish with cardiology who agreed with amiodarone drip and did not recommend cardioversion.  Transferred to [facility] this evening.  Patient states he has been coughing a lot since he left the hospital and every time he coughs, he notices that his sputum has streaks of blood in it.  Denies fevers, chest pain, or shortness of breath.  Denies palpitations.  He is taking Xarelto, metoprolol, and amiodarone at home and  has not missed any doses.  No other complaints.  Review of Systems:  Review of Systems  All other systems reviewed and are negative.   Past Medical History:  Diagnosis Date   Allergy    Anxiety    Atrial fibrillation ([place])    Bell palsy    Depression    Diastolic heart failure    Hypercholesterolemia    Hypertension    Pneumonia    Thyroid disease     Past Surgical History:  Procedure Laterality Date   CARDIOVERSION N/A 09/15/2022   Procedure: CARDIOVERSION;  Surgeon: Werner Lean, MD;  Location: [facility] ENDOSCOPY;  Service: Cardiovascular;  Laterality: N/A;   [place]       reports that he has been smoking cigarettes. He started smoking about 44 years ago. He has a 20.00 pack-year smoking history. He has never used smokeless tobacco. He reports current alcohol use. He reports that he does not use drugs.  Allergies  Allergen Reactions   Sulfa Drugs Cross Reactors Hives   Codeine Other (See Comments)    Made him very jittery    Hydrochlorothiazide Other (See Comments)    Hyponatremia    Family History  Problem Relation Age of Onset   Heart disease Mother    Heart attack Mother    Other Mother        thyroid problems   Kidney cancer Mother    Cirrhosis Father    Other Father        thyroid problems  Alcohol abuse Father    Hypertension Father    Mental illness Brother    Parkinsonism Brother     Prior to Admission medications   Medication Sig Start Date End Date Taking? Authorizing Provider  amiodarone (PACERONE) 200 MG tablet Take 1 tablet (200 mg total) by mouth 2 (two) times daily for 14 days, THEN 1 tablet (200 mg total) daily. Follow up with cardiology for refills. 09/15/22 10/29/22  Elodia Florence., MD  Cholecalciferol (VITAMIN D-3) 125 MCG (5000 UT) TABS Take 5,000 Units by mouth daily.    [provider]  cloNIDine (CATAPRES - DOSED IN MG/24 HR) 0.1 mg/24hr patch APPLY 1 PATCH  ONCE A WEEK 07/24/22   [name], MD  fluticasone [facility]) 50 MCG/ACT nasal spray Place 2 sprays into both nostrils daily as needed for allergies.    [provider]  levothyroxine (SYNTHROID) 200 MCG tablet Take 1 tablet (200 mcg total) by mouth daily. Take as recommended by endocrine.  200 mg daily except for Sunday, take 100 mg. 09/15/22   Elodia Florence., MD  lisinopril (ZESTRIL) 40 MG tablet TAKE 1 TABLET BY MOUTH EVERY DAY 05/13/22   Evans Lance, MD  metoprolol tartrate (LOPRESSOR) 100 MG tablet Take 1 tablet (100 mg total) by mouth 2 (two) times daily. 09/15/22 11/14/22  Elodia Florence., MD  montelukast (SINGULAIR) 10 MG tablet TAKE 1 TABLET BY MOUTH EVERYDAY AT BEDTIME Patient taking differently: Take 10 mg by mouth at bedtime. 04/01/22   Maximiano Coss, NP  ofloxacin (OCUFLOX) 0.3 % ophthalmic solution Place 1 drop into the right eye 4 (four) times daily.    [provider]  prednisoLONE acetate (PRED FORTE) 1 % ophthalmic suspension Place 1 drop into the right eye 4 (four) times daily.    [provider]  rivaroxaban (XARELTO) 20 MG TABS tablet Take 1 tablet (20 mg total) by mouth daily with supper. 08/14/22   Evans Lance, MD  rosuvastatin (CRESTOR) 20 MG tablet Take 1 tablet (20 mg total) by mouth daily. 09/16/22 11/15/22  Elodia Florence., MD  sertraline (ZOLOFT) 50 MG tablet Take 1 tablet (50 mg total) by mouth daily. Patient taking differently: Take 50 mg by mouth 3 (three) times a week. 07/08/22   [name], MD  tamsulosin (FLOMAX) 0.4 MG CAPS capsule TAKE 2 CAPSULES BY MOUTH EVERY DAY 04/24/22   Wendie Agreste, MD    Physical Exam: Vitals:   09/18/22 1500 09/18/22 1515 09/18/22 1617 09/18/22 1630  BP: (!) 129/110 101/74  104/68  Pulse: 97 (!) 107  (!) 104  Resp: 18 (!) 23  20  Temp:   98.7 F (37.1 C)   TempSrc:   Oral   SpO2: 93% 97%  96%    Physical Exam Vitals reviewed.  Constitutional:      General: He is not in  acute distress. HENT:     Head: Normocephalic and atraumatic.  Eyes:     Extraocular Movements: Extraocular movements intact.  Cardiovascular:     Rate and Rhythm: Tachycardia present. Rhythm irregular.     Pulses: Normal pulses.  Pulmonary:     Effort: Pulmonary effort is normal. No respiratory distress.     Breath sounds: No wheezing or rhonchi.  Abdominal:     General: Bowel sounds are normal. There is no distension.     Palpations: Abdomen is soft.     Tenderness: There is no abdominal tenderness.  Musculoskeletal:  Cervical back: Normal range of motion.     Right lower leg: No edema.     Left lower leg: No edema.  Skin:    General: Skin is warm and dry.  Neurological:     General: No focal deficit present.     Mental Status: He is alert and oriented to person, place, and time.     Labs on Admission: I have personally reviewed following labs and imaging studies  CBC: Recent Labs  Lab 09/13/22 0118 09/14/22 0135 09/15/22 0230 09/17/22 1641 09/18/22 0750  WBC 12.4* 15.8* 15.2* 23.4* 20.3*  NEUTROABS 7.4  --   --  20.6* 15.9*  HGB 14.1 14.6 13.8 15.0 12.3*  HCT 42.6 42.3 41.9 45.3 37.0*  MCV 92.8 90.2 92.5 92.4 91.6  PLT 210 201 208 281 779   Basic Metabolic Panel: Recent Labs  Lab 09/13/22 0118 09/14/22 0135 09/15/22 0230 09/17/22 1641 09/18/22 0750  NA 132* 131* 135 139 137  K 3.6 4.3 3.5 4.1 3.5  CL 97* 96* 99 100 100  CO2 '25 25 25 29 29  '$ GLUCOSE 118* 115* 96 111* 102*  BUN 9 6* '8 14 10  '$ CREATININE 0.90 0.86 1.11 0.85 0.65  CALCIUM 8.5* 8.7* 8.7* 9.2 8.1*  MG 1.7 1.7 1.7  --   --   PHOS 4.0 4.0 3.8  --   --    GFR: Estimated Creatinine Clearance: 103.5 mL/min (by C-G formula based on SCr of 0.65 mg/dL). Liver Function Tests: Recent Labs  Lab 09/13/22 0118 09/17/22 1641  AST 29 12*  ALT 35 21  ALKPHOS 93 96  BILITOT 0.7 0.8  PROT 6.0* 7.1  ALBUMIN 3.4* 4.3   No results for input(s): "LIPASE", "AMYLASE" in the last 168 hours. No  results for input(s): "AMMONIA" in the last 168 hours. Coagulation Profile: Recent Labs  Lab 09/14/22 1832 09/17/22 1648  INR 1.9* 1.4*   Cardiac Enzymes: No results for input(s): "CKTOTAL", "CKMB", "CKMBINDEX", "TROPONINI" in the last 168 hours. BNP (last 3 results) No results for input(s): "PROBNP" in the last 8760 hours. HbA1C: No results for input(s): "HGBA1C" in the last 72 hours. CBG: Recent Labs  Lab 09/18/22 1344  GLUCAP 115*   Lipid Profile: No results for input(s): "CHOL", "HDL", "LDLCALC", "TRIG", "CHOLHDL", "LDLDIRECT" in the last 72 hours. Thyroid Function Tests: No results for input(s): "TSH", "T4TOTAL", "FREET4", "T3FREE", "THYROIDAB" in the last 72 hours. Anemia Panel: No results for input(s): "VITAMINB12", "FOLATE", "FERRITIN", "TIBC", "IRON", "RETICCTPCT" in the last 72 hours. Urine analysis:    Component Value Date/Time   COLORURINE YELLOW 05/09/2022 1158   APPEARANCEUR HAZY (A) 05/09/2022 1158   LABSPEC 1.016 05/09/2022 1158   PHURINE 5.0 05/09/2022 1158   GLUCOSEU NEGATIVE 05/09/2022 1158   GLUCOSEU NEGATIVE 03/31/2022 1030   HGBUR NEGATIVE 05/09/2022 1158   BILIRUBINUR NEGATIVE 05/09/2022 1158   BILIRUBINUR negative 05/22/2016 1323   BILIRUBINUR neg 03/22/2015 1017   KETONESUR 5 (A) 05/09/2022 1158   PROTEINUR NEGATIVE 05/09/2022 1158   UROBILINOGEN 0.2 03/31/2022 1030   NITRITE NEGATIVE 05/09/2022 1158   LEUKOCYTESUR NEGATIVE 05/09/2022 1158    Radiological Exams on Admission: DG Chest 2 View  Result Date: 09/17/2022 CLINICAL DATA:  Worsening cough and shortness of breath. Reports coughing up blood this morning. EXAM: CHEST - 2 VIEW COMPARISON:  Radiograph 09/11/2022 FINDINGS: New area of airspace consolidation in the right upper lobe. Small area of consolidation in the medial left lung base. Unchanged elevation of right hemidiaphragm. Stable heart size  and mediastinal contours. No pneumothorax or large pleural effusion. IMPRESSION: New area of  airspace consolidation in the right upper lobe and medial left lung base, suspicious for pneumonia. Electronically Signed   By: Keith Rake M.D.   On: 09/17/2022 17:48    EKG: Independently reviewed.  Initial EKG showing sinus rhythm, repeat showing A-fib with RVR.  Assessment and Plan  Acute hypoxemic respiratory failure secondary to HAP Severe sepsis Patient recently admitted for rhinovirus infection.  Repeat testing done in the ED negative for COVID, influenza, and rhinovirus infection.  Meets criteria for severe sepsis with leukocytosis, tachycardia, and acute hypoxemia.  Oxygen saturation 84% on room air, currently stable on 3 L.  Blood pressure soft but lactic acid normal x 3.  Chest x-ray showing new area of airspace consolidation in the right upper lobe and medial left lung base suspicious for pneumonia.  Suspect bacterial pneumonia. -Continue vancomycin and cefepime -Continue gentle IV fluid hydration and monitor volume status closely given HFmrEF. -Blood cultures pending -Strep pneumo/Legionella antigens -MRSA PCR screen -Sputum Gram stain and culture -Continue supplemental oxygen, wean as tolerated  A-fib with RVR Likely precipitated by infection.  Rate now improved to 100-110s with amiodarone drip. -Cardiac monitoring -Continue amiodarone drip -Hold metoprolol given soft blood pressure -He is on Xarelto and having mild hemoptysis.  Last dose of Xarelto was at 6:20 PM today.  Hemoglobin did drop on repeat labs but this could also be hemodilutional effect as he received IV fluids.  Continue to monitor H&H and consider resuming Xarelto tomorrow versus IV heparin. -Consult cardiology in a.m.  Anemia From mild hemoptysis versus possible hemodilution from IV fluids. Hemoglobin 15.0> 12.3. -Continue to monitor H&H  Mild hypocalcemia -Check ionized calcium level  Hypothyroidism TSH was low (0.26) on labs done 08/31/2022 and dose of Synthroid was adjusted during recent  hospitalization per recommendation from endocrinology (Synthroid 200 mg daily except 100 mg on Sundays). -Continue Synthroid -Outpatient endocrinology follow-up  Hypertension -Hold antihypertensives at this time due to soft blood pressure  Hyperlipidemia -Continue Crestor  HFmrEF Recent echo done 09/12/2022 showing EF 40 to 45%.  Does not appear volume overloaded at this time. -Check BNP  Mood disorder -Continue Zoloft  BPH -Continue Flomax  Code Status: DNR (discussed with the patient) Family Communication: No family available at this time. Level of care: Progressive Care Unit Admission status: It is my clinical opinion that admission to INPATIENT is reasonable and necessary because of the expectation that this patient will require hospital care that crosses at least 2 midnights to treat this condition based on the medical complexity of the problems presented.  Given the aforementioned information, the predictability of an adverse outcome is felt to be significant.   Shela Leff MD [facility]  If 7PM-7AM, please contact night-coverage [email or web address]  09/18/2022, 7:19 PM

## 2022-09-18 NOTE — ED Notes (Signed)
Pt states some improvement in nausea, reports continued feeling of anxiety and new feeling of weakness.

## 2022-09-18 NOTE — ED Notes (Signed)
Patient is sitting up on the side of the bed.  He continues to have productive cough with scant amount of hemoptysis.  Patient has eating cereal bars for breakfast.  He continues to wait for inpatient bed

## 2022-09-18 NOTE — ED Notes (Signed)
EKG performed. Pt states he feels nauseous and anxious. Dr Wyvonnia Dusky made aware.

## 2022-09-18 NOTE — ED Notes (Signed)
RT note: Pt. placed in recliner chair with ED NT, RN aware, call bell within reach.

## 2022-09-18 NOTE — ED Notes (Signed)
Patient reports he has a headache and feels like he is getting tight again in his chest.  MD notified with request for breathing treatments and pain medication orders

## 2022-09-18 NOTE — ED Notes (Signed)
RT note: Pt. currently sleeping in no apparent distress, remains on 2 lpm n/c.

## 2022-09-18 NOTE — ED Notes (Signed)
Called [facility] and spoke to [place]; informed that the patient Bed Assignment is READY

## 2022-09-18 NOTE — ED Notes (Signed)
Patient is resting quietly at this time on his right side.

## 2022-09-18 NOTE — ED Notes (Signed)
Patient asking about his bed assignment.  Assured him that we are still waiting.  Patient given additional pillow for comfort.  He has ongoing rhonchi noted, worse in the right lung.  He continues to lay on the right side for comfort.

## 2022-09-18 NOTE — ED Notes (Signed)
RT note: Pt. up to RR while on room air with Oxygen saturations staying 93-94% t/o, given med. aerosol tx. once back in room per pt. request, placed back on 2 lpm n/c while awaiting [facility] due to sats dropping down to 92%, RN aware.

## 2022-09-18 NOTE — ED Provider Notes (Signed)
Patient holding in the ED for admission for COPD exacerbation, HCAP,  hemoptysis.  At time of transfer, patient went into rapid atrial fibrillation with RVR.  Does have history of same with recent cardioversion 3 days ago.  He may have missed his morning amiodarone and metoprolol.  Will initiate amiodarone drip.  Denies chest pain or shortness of breath.  Discussed with Dr. Percival Spanish of cardiology who agrees with amiodarone drip and does not recommend cardioversion at this time.  Patient believes he has not missed any of his Xarelto but has only been on it for about 3 weeks.  D/w Dr. Sloan Leiter who changed bed to stepdown bed at [facility].  .Critical Care  Performed by: Ezequiel Essex, MD Authorized by: Ezequiel Essex, MD   Critical care provider statement:    Critical care time (minutes):  45   Critical care time was exclusive of:  Separately billable procedures and treating other patients   Critical care was necessary to treat or prevent imminent or life-threatening deterioration of the following conditions: afib with RVR.   Critical care was time spent personally by me on the following activities:  Development of treatment plan with patient or surrogate, discussions with consultants, evaluation of patient's response to treatment, examination of patient, ordering and review of laboratory studies, ordering and review of radiographic studies, ordering and performing treatments and interventions, pulse oximetry, re-evaluation of patient's condition, review of old charts, blood draw for specimens and obtaining history from patient or surrogate   I assumed direction of critical care for this patient from another provider in my specialty: no       Ezequiel Essex, MD 09/18/22 1659

## 2022-09-18 NOTE — ED Notes (Signed)
Pt provider made aware of Pt's asymptomatic hypotension. Pt is resting and has no complaints at this time.

## 2022-09-18 NOTE — ED Notes (Signed)
Pt given something to drink at this time. 

## 2022-09-18 NOTE — ED Notes (Signed)
Patient continues to rest quietly on his right side.  No s/sx of distress.  HR remains in the 50's.  BP is 95/50.

## 2022-09-18 NOTE — ED Notes (Signed)
Patient reports he no longer has a headache.  He is asking about when his bed will be ready.  Advised, we do not have a bed at this time.  Bed placement is unsure when we will have an available room.

## 2022-09-19 ENCOUNTER — Encounter (HOSPITAL_COMMUNITY): Payer: Self-pay | Admitting: Internal Medicine

## 2022-09-19 DIAGNOSIS — E890 Postprocedural hypothyroidism: Secondary | ICD-10-CM | POA: Diagnosis not present

## 2022-09-19 DIAGNOSIS — J9601 Acute respiratory failure with hypoxia: Secondary | ICD-10-CM | POA: Diagnosis not present

## 2022-09-19 DIAGNOSIS — Y95 Nosocomial condition: Secondary | ICD-10-CM | POA: Diagnosis not present

## 2022-09-19 DIAGNOSIS — A419 Sepsis, unspecified organism: Secondary | ICD-10-CM

## 2022-09-19 DIAGNOSIS — R042 Hemoptysis: Secondary | ICD-10-CM | POA: Insufficient documentation

## 2022-09-19 DIAGNOSIS — I429 Cardiomyopathy, unspecified: Secondary | ICD-10-CM

## 2022-09-19 DIAGNOSIS — J189 Pneumonia, unspecified organism: Secondary | ICD-10-CM | POA: Diagnosis not present

## 2022-09-19 DIAGNOSIS — D649 Anemia, unspecified: Secondary | ICD-10-CM

## 2022-09-19 DIAGNOSIS — F39 Unspecified mood [affective] disorder: Secondary | ICD-10-CM | POA: Insufficient documentation

## 2022-09-19 DIAGNOSIS — I480 Paroxysmal atrial fibrillation: Secondary | ICD-10-CM | POA: Diagnosis not present

## 2022-09-19 DIAGNOSIS — R6520 Severe sepsis without septic shock: Secondary | ICD-10-CM

## 2022-09-19 DIAGNOSIS — I10 Essential (primary) hypertension: Secondary | ICD-10-CM | POA: Diagnosis not present

## 2022-09-19 DIAGNOSIS — I482 Chronic atrial fibrillation, unspecified: Secondary | ICD-10-CM

## 2022-09-19 DIAGNOSIS — F17200 Nicotine dependence, unspecified, uncomplicated: Secondary | ICD-10-CM | POA: Insufficient documentation

## 2022-09-19 LAB — EXPECTORATED SPUTUM ASSESSMENT W GRAM STAIN, RFLX TO RESP C

## 2022-09-19 LAB — CBC
HCT: 38.1 % — ABNORMAL LOW (ref 39.0–52.0)
Hemoglobin: 12.9 g/dL — ABNORMAL LOW (ref 13.0–17.0)
MCH: 30.9 pg (ref 26.0–34.0)
MCHC: 33.9 g/dL (ref 30.0–36.0)
MCV: 91.1 fL (ref 80.0–100.0)
Platelets: 204 10*3/uL (ref 150–400)
RBC: 4.18 MIL/uL — ABNORMAL LOW (ref 4.22–5.81)
RDW: 12.7 % (ref 11.5–15.5)
WBC: 13.8 10*3/uL — ABNORMAL HIGH (ref 4.0–10.5)
nRBC: 0 % (ref 0.0–0.2)

## 2022-09-19 LAB — MAGNESIUM
Magnesium: 1.8 mg/dL (ref 1.7–2.4)
Magnesium: 2 mg/dL (ref 1.7–2.4)

## 2022-09-19 LAB — BASIC METABOLIC PANEL
Anion gap: 4 — ABNORMAL LOW (ref 5–15)
BUN: <5 mg/dL — ABNORMAL LOW (ref 8–23)
CO2: 30 mmol/L (ref 22–32)
Calcium: 8.1 mg/dL — ABNORMAL LOW (ref 8.9–10.3)
Chloride: 101 mmol/L (ref 98–111)
Creatinine, Ser: 0.82 mg/dL (ref 0.61–1.24)
GFR, Estimated: >60 mL/min (ref >60)
Glucose, Bld: 126 mg/dL — ABNORMAL HIGH (ref 70–99)
Potassium: 3.7 mmol/L (ref 3.5–5.1)
Sodium: 135 mmol/L (ref 135–145)

## 2022-09-19 LAB — STREP PNEUMONIAE URINARY ANTIGEN: Strep Pneumo Urinary Antigen: NEGATIVE

## 2022-09-19 LAB — BRAIN NATRIURETIC PEPTIDE: B Natriuretic Peptide: 458.8 pg/mL — ABNORMAL HIGH (ref 0.0–100.0)

## 2022-09-19 LAB — MRSA NEXT GEN BY PCR, NASAL: MRSA by PCR Next Gen: DETECTED — AB

## 2022-09-19 MED ORDER — NICOTINE 7 MG/24HR TD PT24: 7.0000 mg | MEDICATED_PATCH | Freq: Every day | TRANSDERMAL | Status: DC

## 2022-09-19 MED ORDER — LORAZEPAM 0.5 MG PO TABS
0.5000 mg | ORAL_TABLET | Freq: Three times a day (TID) | ORAL | Status: DC | PRN
  Administered 2022-09-19 – 2022-09-23 (×8): 0.5 mg via ORAL
  Filled 2022-09-19 (×8): qty 1

## 2022-09-19 MED ORDER — POTASSIUM CHLORIDE CRYS ER 20 MEQ PO TBCR
40.0000 meq | EXTENDED_RELEASE_TABLET | Freq: Once | ORAL | Status: AC
  Administered 2022-09-19: 40 meq via ORAL
  Filled 2022-09-19: qty 2

## 2022-09-19 MED ORDER — METOPROLOL TARTRATE 50 MG PO TABS
50.0000 mg | ORAL_TABLET | Freq: Two times a day (BID) | ORAL | Status: DC
  Administered 2022-09-19 – 2022-09-23 (×9): 50 mg via ORAL
  Filled 2022-09-19 (×9): qty 1

## 2022-09-19 MED ORDER — GUAIFENESIN 100 MG/5ML PO LIQD
5.0000 mL | ORAL | Status: DC | PRN
  Administered 2022-09-19 – 2022-09-23 (×7): 5 mL via ORAL
  Filled 2022-09-19 (×7): qty 10

## 2022-09-19 MED ORDER — TRIMETHOBENZAMIDE HCL 100 MG/ML IM SOLN
200.0000 mg | Freq: Three times a day (TID) | INTRAMUSCULAR | Status: DC | PRN
  Administered 2022-09-19: 200 mg via INTRAMUSCULAR
  Filled 2022-09-19 (×2): qty 2

## 2022-09-19 MED ORDER — RIVAROXABAN 20 MG PO TABS
20.0000 mg | ORAL_TABLET | Freq: Every day | ORAL | Status: DC
  Administered 2022-09-19 – 2022-09-22 (×4): 20 mg via ORAL
  Filled 2022-09-19 (×4): qty 1

## 2022-09-19 NOTE — Progress Notes (Signed)
PROGRESS NOTE  Jesse HANDEL [name] MIW:[id number] DOB: 01/05/1961   PCP: Pcp, No  Patient is from: Home.  Lives with his wife  DOA: 09/17/2022 LOS: 1  Chief complaints Chief Complaint  Patient presents with   Hemoptysis     Brief Narrative / Interim history: 61 year old M with PMH of Graves' disease s/p RIA, HFmrEF, PAF on Xarelto, HTN, HLD, anxiety recent hospitalization from 12/29-1/2 for A-fib with RVR in the setting of rhinovirus infection when he underwent cardioversion returning with productive cough with blood on phlegm, DOE and subjective fever, and admitted for acute respiratory failure with hypoxia in the setting of multifocal pneumonia, A-fib with RVR and mild hemoptysis.  Desaturated to 84% on RA while in ED and required 3 L.  HR in 150s.  WBC 23.4 with left shift.  He was tachycardic and tachypneic as well.  CXR with RUL and medial left lung base infiltrate.  Cultures obtained.  Patient was started on vancomycin, cefepime and amiodarone drip.  Anticoagulation held due to hemoptysis.  The next day, formal cardiology consultation obtained.  Blood cultures NGTD.  Patient continues to endorse hemoptysis  Subjective: Seen and examined earlier this morning.  Continues to endorse hemoptysis that he describes as frank red blood.  Breathing has improved.  Feels anxious and asking for medication to calm him down.  Asking if he is dying.  He denies chest pain  Objective: Vitals:   09/19/22 0537 09/19/22 0541 09/19/22 0742 09/19/22 1125  BP: (!) 118/97  (!) 126/104 123/88  Pulse:   (!) 108 (!) 108  Resp: '19  20 16  '$ Temp: 97.9 F (36.6 C)  98 F (36.7 C) 98.3 F (36.8 C)  TempSrc: Oral  Oral Oral  SpO2: 97%  96%   Weight:  88.6 kg      Examination:  GENERAL: No apparent distress.  Nontoxic. HEENT: MMM.  Vision and hearing grossly intact.  NECK: Supple.  No apparent JVD.  RESP:  No IWOB.  Fair aeration bilaterally. CVS: Irregular rhythm.  HR 90-100.  Heart sounds normal.   ABD/GI/GU: BS+. Abd soft, NTND.  MSK/EXT:  Moves extremities. No apparent deformity. No edema.  SKIN: no apparent skin lesion or wound NEURO: Awake, alert and oriented appropriately.  No apparent focal neuro deficit. PSYCH: Appears anxious.  Procedures:  None  Microbiology summarized: GNOIB-70, influenza and RSV PCR nonreactive Blood cultures NGTD MRSA PCR screen positive. Respiratory culture pending  Assessment and plan: Principal Problem:   HAP (hospital-acquired pneumonia) Active Problems:   Hypothyroidism following radioiodine therapy   Essential hypertension   Dyslipidemia   Chronic atrial fibrillation with RVR (HCC)   Severe sepsis (HCC)   Acute hypoxemic respiratory failure (HCC)   Anemia  Severe sepsis with acute hypoxemic respiratory failure due to HAP: POA.  Has leukocytosis, tachycardia, tachypnea with hypoxic respiratory failure on presentation.  He was 84% on RA requiring 3 L in ED. CXR with RUL and medial left lung base infiltrate.  COVID-19, influenza and RSV PCR nonreactive.  Blood cultures NGTD.  MRSA PCR screen positive.  Sepsis physiology improving. -Continue vancomycin and cefepime -Asked RN to send sputum for Gram stain and culture -Mucolytic's, antitussive -Wean oxygen as able  Paroxysmal A-fib with RVR: Recently hospitalized for this and had cardioversion.  HR elevated to 150 in ED.  Started on amiodarone drip after discussion between cardiology and EDP.  RVR likely provoked by pneumonia. -Cardiology consulted -Continue amiodarone drip -Holding Xarelto in the setting of hemoptysis -Optimize K  and Mg.  Give p.o. KCl 40 x 1.  Check magnesium  Normocytic anemia: H&H stable after initial drop.  Likely hemodilution.  Recent Labs    05/12/22 0257 05/25/22 1249 09/11/22 1335 09/12/22 0142 09/13/22 0118 09/14/22 0135 09/15/22 0230 09/17/22 1641 09/18/22 0750 09/19/22 0102  HGB 12.7* 14.9 14.7 14.2 14.1 14.6 13.8 15.0 12.3* 12.9*  -Continue holding  Xarelto in the setting of hemoptysis -Discontinue IV fluid  Hemoptysis: Patient endorses coughing up frank red blood.  I believe this is from pneumonia while on anticoagulation. -Continue holding Xarelto for now -May consult pulmonology if he continues to have frank mopped assist.  Mild hypocalcemia: Difficult to interpret without albumin level -Follow ionized calcium level.   Hypothyroidism: TSH was low (0.26) on labs done 08/31/2022 and dose of Synthroid was adjusted during recent hospitalization per recommendation from endocrinology (Synthroid 200 mg daily except 100 mg on 'Sundays). -Continue Synthroid -Outpatient endocrinology follow-up   Essential hypertension: Normotensive. -Resume home medications gradually starting with metoprolol at half home dose.   Hyperlipidemia -Continue Crestor   HFmrEF: Appears euvolemic.  TTE with LVEF of 40 to 45% in 08/2022.  BNP elevated to 460 which is slightly higher than prior.  Patient is not on diuretics at home. -Discontinue IV fluid -Monitor fluid and respiratory status.   Mood disorder with anxiety -Continue Zoloft -Added Ativan   BPH -Continue Flomax  Tobacco use disorder: Reports smoking about 4 to 5 cigarettes a day. -Encouraged smoking cessation.  He would like to try nicotine patch.   Body mass index is 28.84 kg/m.          DVT prophylaxis:  Place and maintain sequential compression device Start: 09/19/22 1131  Code Status: DNR/DNI Family Communication: None at bedside Level of care: Progressive Status is: Inpatient Remains inpatient appropriate because: Severe sepsis, hypoxic respiratory failure, HAP, A-fib with RVR and hemoptysis   Final disposition: Likely home once medically stable Consultants:  Cardiology  Sch Meds:  Scheduled Meds:  levothyroxine  200 mcg Oral Once per day on Mon Tue Wed Thu Fri Sat   [START ON 09/20/2022] levothyroxine  200 mcg Oral Q Sun   metoprolol tartrate  50 mg Oral BID    montelukast  10 mg Oral QHS   nicotine  7 mg Transdermal Daily   ofloxacin  1 drop Right Eye QID   prednisoLONE acetate  1 drop Right Eye QID   rosuvastatin  20 mg Oral Daily   sertraline  50 mg Oral Daily   tamsulosin  0.8 mg Oral Daily   Continuous Infusions:  amiodarone 30 mg/hr (09/19/22 0054)   ceFEPime (MAXIPIME) IV 2 g (09/19/22 0540)   vancomycin 1,000 mg (09/19/22 0753)   PRN Meds:.acetaminophen **OR** acetaminophen, guaiFENesin, levalbuterol, LORazepam, melatonin  Antimicrobials: Anti-infectives (From admission, onward)    Start     Dose/Rate Route Frequency Ordered Stop   09/18/22 0800  vancomycin (VANCOCIN) IVPB 1000 mg/200 mL premix        1,000 mg 200 mL/hr over 60 Minutes Intravenous Every 12 hours 09/17/22 2015     01'$ /04/24 2015  vancomycin (VANCOCIN) IVPB 1000 mg/200 mL premix  Status:  Discontinued        1,000 mg 200 mL/hr over 60 Minutes Intravenous  Once 09/17/22 2011 09/17/22 2014   09/17/22 2015  ceFEPIme (MAXIPIME) 2 g in sodium chloride 0.9 % 100 mL IVPB  Status:  Discontinued        2 g 200 mL/hr over 30 Minutes Intravenous  Once 09/17/22 2011 09/17/22 2014   09/17/22 2015  ceFEPIme (MAXIPIME) 2 g in sodium chloride 0.9 % 100 mL IVPB        2 g 200 mL/hr over 30 Minutes Intravenous Every 8 hours 09/17/22 2014     09/17/22 2015  vancomycin (VANCOCIN) IVPB 1000 mg/200 mL premix       See Hyperspace for full Linked Orders Report.   1,000 mg 200 mL/hr over 60 Minutes Intravenous  Once 09/17/22 2014 09/18/22 0027   09/17/22 2015  vancomycin (VANCOCIN) IVPB 1000 mg/200 mL premix       See Hyperspace for full Linked Orders Report.   1,000 mg 200 mL/hr over 60 Minutes Intravenous  Once 09/17/22 2014 09/17/22 2308        I have personally reviewed the following labs and images: CBC: Recent Labs  Lab 09/13/22 0118 09/14/22 0135 09/15/22 0230 09/17/22 1641 09/18/22 0750 09/19/22 0102  WBC 12.4* 15.8* 15.2* 23.4* 20.3* 13.8*  NEUTROABS 7.4  --   --   20.6* 15.9*  --   HGB 14.1 14.6 13.8 15.0 12.3* 12.9*  HCT 42.6 42.3 41.9 45.3 37.0* 38.1*  MCV 92.8 90.2 92.5 92.4 91.6 91.1  PLT 210 201 208 281 212 204   BMP &GFR Recent Labs  Lab 09/13/22 0118 09/14/22 0135 09/15/22 0230 09/17/22 1641 09/18/22 0750 09/19/22 0102  NA 132* 131* 135 139 137 135  K 3.6 4.3 3.5 4.1 3.5 3.7  CL 97* 96* 99 100 100 101  CO2 '25 25 25 29 29 30  '$ GLUCOSE 118* 115* 96 111* 102* 126*  BUN 9 6* '8 14 10 '$ <5*  CREATININE 0.90 0.86 1.11 0.85 0.65 0.82  CALCIUM 8.5* 8.7* 8.7* 9.2 8.1* 8.1*  MG 1.7 1.7 1.7  --   --   --   PHOS 4.0 4.0 3.8  --   --   --    Estimated Creatinine Clearance: 101.6 mL/min (by C-G formula based on SCr of 0.82 mg/dL). Liver & Pancreas: Recent Labs  Lab 09/13/22 0118 09/17/22 1641  AST 29 12*  ALT 35 21  ALKPHOS 93 96  BILITOT 0.7 0.8  PROT 6.0* 7.1  ALBUMIN 3.4* 4.3   No results for input(s): "LIPASE", "AMYLASE" in the last 168 hours. No results for input(s): "AMMONIA" in the last 168 hours. Diabetic: No results for input(s): "HGBA1C" in the last 72 hours. Recent Labs  Lab 09/18/22 1344  GLUCAP 115*   Cardiac Enzymes: No results for input(s): "CKTOTAL", "CKMB", "CKMBINDEX", "TROPONINI" in the last 168 hours. No results for input(s): "PROBNP" in the last 8760 hours. Coagulation Profile: Recent Labs  Lab 09/14/22 1832 09/17/22 1648  INR 1.9* 1.4*   Thyroid Function Tests: No results for input(s): "TSH", "T4TOTAL", "FREET4", "T3FREE", "THYROIDAB" in the last 72 hours. Lipid Profile: No results for input(s): "CHOL", "HDL", "LDLCALC", "TRIG", "CHOLHDL", "LDLDIRECT" in the last 72 hours. Anemia Panel: No results for input(s): "VITAMINB12", "FOLATE", "FERRITIN", "TIBC", "IRON", "RETICCTPCT" in the last 72 hours. Urine analysis:    Component Value Date/Time   COLORURINE YELLOW 05/09/2022 1158   APPEARANCEUR HAZY (A) 05/09/2022 1158   LABSPEC 1.016 05/09/2022 1158   PHURINE 5.0 05/09/2022 1158   GLUCOSEU  NEGATIVE 05/09/2022 1158   GLUCOSEU NEGATIVE 03/31/2022 1030   HGBUR NEGATIVE 05/09/2022 1158   BILIRUBINUR NEGATIVE 05/09/2022 1158   BILIRUBINUR negative 05/22/2016 1323   BILIRUBINUR neg 03/22/2015 1017   KETONESUR 5 (A) 05/09/2022 1158   PROTEINUR NEGATIVE 05/09/2022 1158   UROBILINOGEN  0.2 03/31/2022 1030   NITRITE NEGATIVE 05/09/2022 1158   LEUKOCYTESUR NEGATIVE 05/09/2022 1158   Sepsis Labs: Invalid input(s): "PROCALCITONIN", "LACTICIDVEN"  Microbiology: Recent Results (from the past 240 hour(s))  Resp panel by RT-PCR (RSV, Flu A&B, Covid) Anterior Nasal Swab     Status: None   Collection Time: 09/11/22  4:32 PM   Specimen: Anterior Nasal Swab  Result Value Ref Range Status   SARS Coronavirus 2 by RT PCR NEGATIVE NEGATIVE Final    Comment: (NOTE) SARS-CoV-2 target nucleic acids are NOT DETECTED.  The SARS-CoV-2 RNA is generally detectable in upper respiratory specimens during the acute phase of infection. The lowest concentration of SARS-CoV-2 viral copies this assay can detect is 138 copies/mL. A negative result does not preclude SARS-Cov-2 infection and should not be used as the sole basis for treatment or other patient management decisions. A negative result may occur with  improper specimen collection/handling, submission of specimen other than nasopharyngeal swab, presence of viral mutation(s) within the areas targeted by this assay, and inadequate number of viral copies(<138 copies/mL). A negative result must be combined with clinical observations, patient history, and epidemiological information. The expected result is Negative.  Fact Sheet for Patients:  EntrepreneurPulse.com.au  Fact Sheet for Healthcare Providers:  IncredibleEmployment.be  This test is no t yet approved or cleared by the [place] FDA and  has been authorized for detection and/or diagnosis of SARS-CoV-2 by FDA under an Emergency Use Authorization  (EUA). This EUA will remain  in effect (meaning this test can be used) for the duration of the COVID-19 declaration under Section 564(b)(1) of the Act, 21 U.S.C.section 360bbb-3(b)(1), unless the authorization is terminated  or revoked sooner.       Influenza A by PCR NEGATIVE NEGATIVE Final   Influenza B by PCR NEGATIVE NEGATIVE Final    Comment: (NOTE) The Xpert Xpress SARS-CoV-2/FLU/RSV plus assay is intended as an aid in the diagnosis of influenza from Nasopharyngeal swab specimens and should not be used as a sole basis for treatment. Nasal washings and aspirates are unacceptable for Xpert Xpress SARS-CoV-2/FLU/RSV testing.  Fact Sheet for Patients: EntrepreneurPulse.com.au  Fact Sheet for Healthcare Providers: IncredibleEmployment.be  This test is not yet approved or cleared by the [place] FDA and has been authorized for detection and/or diagnosis of SARS-CoV-2 by FDA under an Emergency Use Authorization (EUA). This EUA will remain in effect (meaning this test can be used) for the duration of the COVID-19 declaration under Section 564(b)(1) of the Act, 21 U.S.C. section 360bbb-3(b)(1), unless the authorization is terminated or revoked.     Resp Syncytial Virus by PCR NEGATIVE NEGATIVE Final    Comment: (NOTE) Fact Sheet for Patients: EntrepreneurPulse.com.au  Fact Sheet for Healthcare Providers: IncredibleEmployment.be  This test is not yet approved or cleared by the [place] FDA and has been authorized for detection and/or diagnosis of SARS-CoV-2 by FDA under an Emergency Use Authorization (EUA). This EUA will remain in effect (meaning this test can be used) for the duration of the COVID-19 declaration under Section 564(b)(1) of the Act, 21 U.S.C. section 360bbb-3(b)(1), unless the authorization is terminated or revoked.  Performed at [facility], [place] [address],  [place], [place] [address]   Respiratory (~20 pathogens) panel by PCR     Status: Abnormal   Collection Time: 09/12/22  8:30 AM   Specimen: Nasopharyngeal Swab; Respiratory  Result Value Ref Range Status   Adenovirus NOT DETECTED NOT DETECTED Final   Coronavirus 229E NOT DETECTED  NOT DETECTED Final    Comment: (NOTE) The Coronavirus on the Respiratory Panel, DOES NOT test for the novel  Coronavirus (2019 nCoV)    Coronavirus HKU1 NOT DETECTED NOT DETECTED Final   Coronavirus NL63 NOT DETECTED NOT DETECTED Final   Coronavirus OC43 NOT DETECTED NOT DETECTED Final   Metapneumovirus NOT DETECTED NOT DETECTED Final   Rhinovirus / Enterovirus DETECTED (A) NOT DETECTED Final   Influenza A NOT DETECTED NOT DETECTED Final   Influenza B NOT DETECTED NOT DETECTED Final   Parainfluenza Virus 1 NOT DETECTED NOT DETECTED Final   Parainfluenza Virus 2 NOT DETECTED NOT DETECTED Final   Parainfluenza Virus 3 NOT DETECTED NOT DETECTED Final   Parainfluenza Virus 4 NOT DETECTED NOT DETECTED Final   Respiratory Syncytial Virus NOT DETECTED NOT DETECTED Final   Bordetella pertussis NOT DETECTED NOT DETECTED Final   Bordetella Parapertussis NOT DETECTED NOT DETECTED Final   Chlamydophila pneumoniae NOT DETECTED NOT DETECTED Final   Mycoplasma pneumoniae NOT DETECTED NOT DETECTED Final    Comment: Performed at [facility], [place] [address], [place], [place] [address]  Resp panel by RT-PCR (RSV, Flu A&B, Covid) Anterior Nasal Swab     Status: None   Collection Time: 09/17/22  4:41 PM   Specimen: Anterior Nasal Swab  Result Value Ref Range Status   SARS Coronavirus 2 by RT PCR NEGATIVE NEGATIVE Final    Comment: (NOTE) SARS-CoV-2 target nucleic acids are NOT DETECTED.  The SARS-CoV-2 RNA is generally detectable in upper respiratory specimens during the acute phase of infection. The lowest concentration of SARS-CoV-2 viral copies this assay can detect is 138 copies/mL. A negative result does not  preclude SARS-Cov-2 infection and should not be used as the sole basis for treatment or other patient management decisions. A negative result may occur with  improper specimen collection/handling, submission of specimen other than nasopharyngeal swab, presence of viral mutation(s) within the areas targeted by this assay, and inadequate number of viral copies(<138 copies/mL). A negative result must be combined with clinical observations, patient history, and epidemiological information. The expected result is Negative.  Fact Sheet for Patients:  EntrepreneurPulse.com.au  Fact Sheet for Healthcare Providers:  IncredibleEmployment.be  This test is no t yet approved or cleared by the [place] FDA and  has been authorized for detection and/or diagnosis of SARS-CoV-2 by FDA under an Emergency Use Authorization (EUA). This EUA will remain  in effect (meaning this test can be used) for the duration of the COVID-19 declaration under Section 564(b)(1) of the Act, 21 U.S.C.section 360bbb-3(b)(1), unless the authorization is terminated  or revoked sooner.       Influenza A by PCR NEGATIVE NEGATIVE Final   Influenza B by PCR NEGATIVE NEGATIVE Final    Comment: (NOTE) The Xpert Xpress SARS-CoV-2/FLU/RSV plus assay is intended as an aid in the diagnosis of influenza from Nasopharyngeal swab specimens and should not be used as a sole basis for treatment. Nasal washings and aspirates are unacceptable for Xpert Xpress SARS-CoV-2/FLU/RSV testing.  Fact Sheet for Patients: EntrepreneurPulse.com.au  Fact Sheet for Healthcare Providers: IncredibleEmployment.be  This test is not yet approved or cleared by the [place] FDA and has been authorized for detection and/or diagnosis of SARS-CoV-2 by FDA under an Emergency Use Authorization (EUA). This EUA will remain in effect (meaning this test can be used) for the  duration of the COVID-19 declaration under Section 564(b)(1) of the Act, 21 U.S.C. section 360bbb-3(b)(1), unless the authorization is terminated or revoked.  Resp Syncytial Virus by PCR NEGATIVE NEGATIVE Final    Comment: (NOTE) Fact Sheet for Patients: EntrepreneurPulse.com.au  Fact Sheet for Healthcare Providers: IncredibleEmployment.be  This test is not yet approved or cleared by the [place] FDA and has been authorized for detection and/or diagnosis of SARS-CoV-2 by FDA under an Emergency Use Authorization (EUA). This EUA will remain in effect (meaning this test can be used) for the duration of the COVID-19 declaration under Section 564(b)(1) of the Act, 21 U.S.C. section 360bbb-3(b)(1), unless the authorization is terminated or revoked.  Performed at [facility], [address] [place], [place] [address]   Blood Culture (routine x 2)     Status: None (Preliminary result)   Collection Time: 09/17/22  8:35 PM   Specimen: BLOOD  Result Value Ref Range Status   Specimen Description   Final    BLOOD RIGHT ANTECUBITAL Performed at [facility], [address], [place], [place] [address]    Special Requests   Final    BOTTLES DRAWN AEROBIC AND ANAEROBIC Blood Culture adequate volume Performed at [facility], [address], [place], [place] [address]    Culture   Final    NO GROWTH < 24 HOURS Performed at [facility], [place] [address] [place], [place] [address]    Report Status PENDING  Incomplete  Blood Culture (routine x 2)     Status: None (Preliminary result)   Collection Time: 09/17/22  8:38 PM   Specimen: BLOOD  Result Value Ref Range Status   Specimen Description   Final    BLOOD LEFT ANTECUBITAL Performed at [facility], [address] [place], [place] [address]    Special Requests   Final    BOTTLES DRAWN AEROBIC AND ANAEROBIC  Blood Culture adequate volume Performed at [facility], [address], [place], [place] [address]    Culture   Final    NO GROWTH < 24 HOURS Performed at [facility], [place] [address] [place], [place] [address]    Report Status PENDING  Incomplete  MRSA Next Gen by PCR, Nasal     Status: Abnormal   Collection Time: 09/18/22  8:45 PM   Specimen: Nasal Mucosa; Nasal Swab  Result Value Ref Range Status   MRSA by PCR Next Gen DETECTED (A) NOT DETECTED Final    Comment: RESULT CALLED TO, READ BACK BY AND VERIFIED WITH: F JONES,RN'@[id number]'$  09/19/22 [place] (NOTE) The GeneXpert MRSA Assay (FDA approved for NASAL specimens only), is one component of a comprehensive MRSA colonization surveillance program. It is not intended to diagnose MRSA infection nor to guide or monitor treatment for MRSA infections. Test performance is not FDA approved in patients less than 69 years old. Performed at [facility], [place] [address] [place], [place] [address]   Expectorated Sputum Assessment w Gram Stain, Rflx to Resp Cult     Status: None (Preliminary result)   Collection Time: 09/18/22  8:46 PM   Specimen: Sputum  Result Value Ref Range Status   Specimen Description SPUTUM  Final   Special Requests NONE  Final   Sputum evaluation   Final    THIS SPECIMEN IS ACCEPTABLE FOR SPUTUM CULTURE Performed at [facility], [place] [address] [place],  [address]    Report Status PENDING  Incomplete    Radiology Studies: No results found.     T. Spencer  If 7PM-7AM, please contact night-coverage [email or web address] 09/19/2022, 11:31  AM

## 2022-09-19 NOTE — Consult Note (Signed)
Cardiology Consultation   Patient ID: Jesse TANEY [name] MRN: [id number]; DOB: 1960-10-28  Admit date: 09/17/2022 Date of Consult: 09/19/2022  PCP:  Kathyrn Lass   [facility] Cardiologist:  Cristopher Peru, MD        Patient Profile:   Jesse CORIGLIANO [name] is a 62 y.o. male with a hx of  PAF, grave's disease, HTN, + PVCs who is being seen 09/19/2022 for the evaluation of atrial fib with RVR in combination with PNA at the request of Dr. Cyndia Skeeters.  History of Present Illness:   Jesse Suarez with above hx and recent admit with viral respiratory illness and a fib with RVR when he was at surgical center for cataract removal.  He and Dr. Lovena Le had decided on rate control instead on rhythm control but with viral infection HR was 120-160s.  He was placed on IV amiodarone with soft BP.   He did undergo DCCV on 09/15/22 successfully.  He was discharged same day.   He was placed on amiodarone 200 BID for 2 weeks then once daily and metoprolol 100 mg BID.    He is on xarelto for anticoagulation.   Pt with ETT 2018 ordered but not done.  Echo 09/12/22 with EF 40-45% in atrial fib with LV with global hypokinesis. Mild concentric LVH. LA moderately dilated, RA size moderately dilated. Mild MR.    Echo from 2012 with EF 60% in rapid atrial fib with LA mod dilated.  No known CAD.    Pt arrived to ER by EMS 09/17/21 with coughing up blood (streaks of blood in phlegm), sp02 was 93% on RA. Though down to 84% on RA.  WBC 23.4, Hgb 15 down to 12. He was in [name]   2V CXR:  IMPRESSION: New area of airspace consolidation in the right upper lobe and medial left lung base, suspicious for pneumonia   In ER patient was given vancomycin and cefepim, Tylenol, DuoNeb, Synthroid, lisinopril, IV and oral metoprolol, Zofran, Xarelto, Zoloft, Flomax, and IV fluids.  While in ER waiting for a bed he went into A fib with rVR with rates in 150s and IV amiodarone started.    He was neg fro CIVID, Flu, and rhinovirus.    Blood pressure soft but lactic acid normal x 3.  Last dose of xarelto 6:20 pm yesterday.   BNP 458 Na 135, K+ 3.7 Cr. 0.82  WBC now 13.8 down from 23.4   Hgb 12.9 plts. 204.   EKG:  The EKG was personally reviewed and demonstrates:  initial EKG 09/17/21 SR with Rt ward axis, no acute ST changes.  On 1- 5-23  atrial flutter at 153, regular, computer read as atrial fib.  Telemetry:  Telemetry was personally reviewed and demonstrates:  atrial flutter  BP 126/104 P 107 afebrile R 20  no chest pain.   Past Medical History:  Diagnosis Date   Allergy    Anxiety    Atrial fibrillation ([place])    Bell palsy    Depression    Diastolic heart failure    Hypercholesterolemia    Hypertension    Pneumonia    Thyroid disease     Past Surgical History:  Procedure Laterality Date   CARDIOVERSION N/A 09/15/2022   Procedure: CARDIOVERSION;  Surgeon: Werner Lean, MD;  Location: [facility];  Service: Cardiovascular;  Laterality: N/A;   [place]  Home Medications:  Prior to Admission medications   Medication Sig Start Date End Date Taking? Authorizing Provider  amiodarone (PACERONE) 200 MG tablet Take 1 tablet (200 mg total) by mouth 2 (two) times daily for 14 days, THEN 1 tablet (200 mg total) daily. Follow up with cardiology for refills. 09/15/22 10/29/22 Yes Elodia Florence., MD  Cholecalciferol (VITAMIN D-3) 125 MCG (5000 UT) TABS Take 5,000 Units by mouth daily.   Yes [provider]  fluticasone (FLONASE) 50 MCG/ACT nasal spray Place 2 sprays into both nostrils daily as needed for allergies.   Yes [provider]  levothyroxine (SYNTHROID) 200 MCG tablet Take 1 tablet (200 mcg total) by mouth daily. Take as recommended by endocrine.  200 mg daily except for Sunday, take 100 mg. 09/15/22  Yes Elodia Florence., MD  lisinopril (ZESTRIL) 40 MG tablet TAKE 1 TABLET BY MOUTH EVERY DAY 05/13/22  Yes  Evans Lance, MD  metoprolol tartrate (LOPRESSOR) 100 MG tablet Take 1 tablet (100 mg total) by mouth 2 (two) times daily. 09/15/22 11/14/22 Yes Elodia Florence., MD  montelukast (SINGULAIR) 10 MG tablet TAKE 1 TABLET BY MOUTH EVERYDAY AT BEDTIME Patient taking differently: Take 10 mg by mouth at bedtime. 04/01/22  Yes Maximiano Coss, NP  ofloxacin (OCUFLOX) 0.3 % ophthalmic solution Place 1 drop into the right eye 4 (four) times daily.   Yes [provider]  prednisoLONE acetate (PRED FORTE) 1 % ophthalmic suspension Place 1 drop into the right eye 4 (four) times daily.   Yes [provider]  rivaroxaban (XARELTO) 20 MG TABS tablet Take 1 tablet (20 mg total) by mouth daily with supper. 08/14/22  Yes Evans Lance, MD  rosuvastatin (CRESTOR) 20 MG tablet Take 1 tablet (20 mg total) by mouth daily. 09/16/22 11/15/22 Yes Elodia Florence., MD  tamsulosin (FLOMAX) 0.4 MG CAPS capsule TAKE 2 CAPSULES BY MOUTH EVERY DAY Patient taking differently: Take 0.4 mg by mouth daily. 04/24/22  Yes Wendie Agreste, MD  cloNIDine (CATAPRES - DOSED IN MG/24 HR) 0.1 mg/24hr patch APPLY 1 PATCH ONCE A WEEK Patient taking differently: Place 0.1 mg onto the skin once a week. 07/24/22   [name], MD  sertraline (ZOLOFT) 50 MG tablet Take 1 tablet (50 mg total) by mouth daily. Patient taking differently: Take 50 mg by mouth 3 (three) times a week. 07/08/22   [name], MD    Inpatient Medications: Scheduled Meds:  levothyroxine  200 mcg Oral Once per day on Mon Tue Wed Thu Fri Sat   [START ON 09/20/2022] levothyroxine  200 mcg Oral Q Sun   montelukast  10 mg Oral QHS   ofloxacin  1 drop Right Eye QID   prednisoLONE acetate  1 drop Right Eye QID   rosuvastatin  20 mg Oral Daily   sertraline  50 mg Oral Daily   tamsulosin  0.8 mg Oral Daily   Continuous Infusions:  sodium chloride 50 mL/hr at 09/19/22 0245   amiodarone 30 mg/hr (09/19/22 0054)   ceFEPime (MAXIPIME) IV 2 g (09/19/22  0540)   vancomycin 1,000 mg (09/19/22 0753)   PRN Meds: acetaminophen **OR** acetaminophen, guaiFENesin, levalbuterol, melatonin  Allergies:    Allergies  Allergen Reactions   Sulfa Drugs Cross Reactors Hives   Codeine Other (See Comments)    Made him very jittery    Hydrochlorothiazide Other (See Comments)    Hyponatremia    Social History:   Social History  Socioeconomic History   Marital status: Married    Spouse name: Not on file   Number of children: 1   Years of education: Not on file   Highest education level: Not on file  Occupational History   Not on file  Tobacco Use   Smoking status: Every Day    Packs/day: 0.50    Years: 40.00    Total pack years: 20.00    Types: Cigarettes    Start date: 09/14/1978   Smokeless tobacco: Never  Vaping Use   Vaping Use: Never used  Substance and Sexual Activity   Alcohol use: Yes    Alcohol/week: 0.0 standard drinks of alcohol    Comment: rarely   Drug use: No   Sexual activity: Never    Birth control/protection: None  Other Topics Concern   Not on file  Social History Narrative   Marital status: married x 30+ years      Children:  2 children;(38,37); 3+3 grandchild      Lives:  With wife, son      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      Tobacco: 1 ppd x 30 years      Alcohol: rare     Regular exercise: walking daily   Caffeine use: daily      Social Determinants of Health   Financial Resource Strain: Not on file  Food Insecurity: No Food Insecurity (09/13/2022)   Hunger Vital Sign    Worried About Running Out of Food in the Last Year: Never true    Ran Out of Food in the Last Year: Never true  Transportation Needs: No Transportation Needs (09/13/2022)   PRAPARE - Hydrologist (Medical): No    Lack of Transportation (Non-Medical): No  Physical Activity: Not on file  Stress: Not on file  Social Connections: Not on file  Intimate Partner Violence: Not At  Risk (09/13/2022)   Humiliation, Afraid, Rape, and Kick questionnaire    Fear of Current or Ex-Partner: No    Emotionally Abused: No    Physically Abused: No    Sexually Abused: No    Family History:    Family History  Problem Relation Age of Onset   Heart disease Mother    Heart attack Mother    Other Mother        thyroid problems   Kidney cancer Mother    Cirrhosis Father    Other Father        thyroid problems   Alcohol abuse Father    Hypertension Father    Mental illness Brother    Parkinsonism Brother      ROS:  Please see the history of present illness.  General:no colds or fevers, no weight changes Skin:no rashes or ulcers HEENT:no blurred vision, no congestion CV:see HPI PUL:see HPI GI:no diarrhea constipation or melena, no indigestion GU:no hematuria, no dysuria MS:no joint pain, no claudication Neuro:no syncope, no lightheadedness Endo:no diabetes, + thyroid disease with hx of grave's disease and ablation  All other ROS reviewed and negative.     Physical Exam/Data:   Vitals:   09/19/22 0011 09/19/22 0537 09/19/22 0541 09/19/22 0742  BP: 113/87 (!) 118/97  (!) 126/104  Pulse: (!) 106   (!) 108  Resp: '19 19  20  '$ Temp: (!) 97.5 F (36.4 C) 97.9 F (36.6 C)  98 F (36.7 C)  TempSrc: Oral Oral  Oral  SpO2: 97% 97%  96%  Weight:  88.6 kg     Intake/Output Summary (Last 24 hours) at 09/19/2022 0812 Last data filed at 09/19/2022 0612 Gross per 24 hour  Intake 2290.49 ml  Output 2095 ml  Net 195.49 ml      09/19/2022    5:41 AM 09/11/2022    1:23 PM 09/02/2022   11:05 AM  Last 3 Weights  Weight (lbs) 195 lb 4.8 oz 193 lb 193 lb  Weight (kg) 88.587 kg 87.544 kg 87.544 kg     Body mass index is 28.84 kg/m.  General:  Well nourished, well developed, in no acute distress HEENT: normal Neck: no JVD Vascular: No carotid bruits; Distal pulses 2+ bilaterally Cardiac:  irreg irreg; no murmur gallup rub or click Lungs:  diminished breath sounds to  auscultation bilaterally, no wheezing, occ rhonchi no rales  Abd: soft, nontender, no hepatomegaly  Ext: no edema Musculoskeletal:  No deformities, BUE and BLE strength normal and equal Skin: warm and dry  Neuro:  alert and oriented X 3 MAE follows commands no focal abnormalities noted Psych:  Normal affect     Relevant CV Studies: 09/12/22  TTE IMPRESSIONS     1. Left ventricular ejection fraction, by estimation, is 40 to 45%. The  left ventricle has mildly decreased function. The left ventricle  demonstrates global hypokinesis. There is mild concentric left ventricular  hypertrophy. Left ventricular diastolic  function could not be evaluated.   2. Right ventricular systolic function is normal. The right ventricular  size is mildly enlarged. There is normal pulmonary artery systolic  pressure. The estimated right ventricular systolic pressure is 41.3 mmHg.   3. Left atrial size was moderately dilated.   4. Right atrial size was moderately dilated.   5. The mitral valve is normal in structure. Mild mitral valve  regurgitation.   6. The aortic valve is tricuspid. Aortic valve regurgitation is not  visualized. No aortic stenosis is present.   7. The inferior vena cava is normal in size with greater than 50%  respiratory variability, suggesting right atrial pressure of 3 mmHg.   FINDINGS   Left Ventricle: Left ventricular ejection fraction, by estimation, is 40  to 45%. The left ventricle has mildly decreased function. The left  ventricle demonstrates global hypokinesis. The left ventricular internal  cavity size was normal in size. There is   mild concentric left ventricular hypertrophy. Left ventricular diastolic  function could not be evaluated due to atrial fibrillation. Left  ventricular diastolic function could not be evaluated.   Right Ventricle: The right ventricular size is mildly enlarged. No  increase in right ventricular wall thickness. Right ventricular systolic   function is normal. There is normal pulmonary artery systolic pressure.  The tricuspid regurgitant velocity is 2.14   m/s, and with an assumed right atrial pressure of 3 mmHg, the estimated  right ventricular systolic pressure is 24.4 mmHg.   Left Atrium: Left atrial size was moderately dilated.   Right Atrium: Right atrial size was moderately dilated.   Pericardium: There is no evidence of pericardial effusion.   Mitral Valve: The mitral valve is normal in structure. Mild mitral valve  regurgitation.   Tricuspid Valve: The tricuspid valve is normal in structure. Tricuspid  valve regurgitation is trivial.   Aortic Valve: The aortic valve is tricuspid. Aortic valve regurgitation is  not visualized. No aortic stenosis is present.   Pulmonic Valve: The pulmonic valve was normal in structure. Pulmonic valve  regurgitation is not visualized.   Laboratory Data:  High Sensitivity Troponin:   Recent Labs  Lab 09/11/22 1335 09/11/22 1529  TROPONINIHS 5 5     Chemistry Recent Labs  Lab 09/13/22 0118 09/14/22 0135 09/15/22 0230 09/17/22 1641 09/18/22 0750 09/19/22 0102  NA 132* 131* 135 139 137 135  K 3.6 4.3 3.5 4.1 3.5 3.7  CL 97* 96* 99 100 100 101  CO2 '25 25 25 29 29 30  '$ GLUCOSE 118* 115* 96 111* 102* 126*  BUN 9 6* '8 14 10 '$ <5*  CREATININE 0.90 0.86 1.11 0.85 0.65 0.82  CALCIUM 8.5* 8.7* 8.7* 9.2 8.1* 8.1*  MG 1.7 1.7 1.7  --   --   --   GFRNONAA >60 >60 >60 >60 >60 >60  ANIONGAP '10 10 11 10 8 '$ 4*    Recent Labs  Lab 09/13/22 0118 09/17/22 1641  PROT 6.0* 7.1  ALBUMIN 3.4* 4.3  AST 29 12*  ALT 35 21  ALKPHOS 93 96  BILITOT 0.7 0.8   Lipids No results for input(s): "CHOL", "TRIG", "HDL", "LABVLDL", "LDLCALC", "CHOLHDL" in the last 168 hours.  Hematology Recent Labs  Lab 09/17/22 1641 09/18/22 0750 09/19/22 0102  WBC 23.4* 20.3* 13.8*  RBC 4.90 4.04* 4.18*  HGB 15.0 12.3* 12.9*  HCT 45.3 37.0* 38.1*  MCV 92.4 91.6 91.1  MCH 30.6 30.4 30.9  MCHC  33.1 33.2 33.9  RDW 12.8 13.0 12.7  PLT 281 212 204   Thyroid No results for input(s): "TSH", "FREET4" in the last 168 hours.  BNP Recent Labs  Lab 09/19/22 0102  BNP 458.8*    DDimer No results for input(s): "DDIMER" in the last 168 hours.   Radiology/Studies:  DG Chest 2 View  Result Date: 09/17/2022 CLINICAL DATA:  Worsening cough and shortness of breath. Reports coughing up blood this morning. EXAM: CHEST - 2 VIEW COMPARISON:  Radiograph 09/11/2022 FINDINGS: New area of airspace consolidation in the right upper lobe. Small area of consolidation in the medial left lung base. Unchanged elevation of right hemidiaphragm. Stable heart size and mediastinal contours. No pneumothorax or large pleural effusion. IMPRESSION: New area of airspace consolidation in the right upper lobe and medial left lung base, suspicious for pneumonia. Electronically Signed   By: Keith Rake M.D.   On: 09/17/2022 17:48     Assessment and Plan:   Atrial fib with RVR with persistent atrial fib.  Previously plan was for rhythm control, but with recent admit with viral infection he did undergo DCCV after amiodarone drip and discharged on amiodarone 200 BID for .2 weeks then 200 daily.   On this admit for PNA he was in sR on arrival and then went into a flutter. With RVR and placed on amiodarone drip.  Agree with this plan and would continue IV amiodarone until PNA improved   add BB back as BP allows.  Pt may convert on his own, but if now then most likely arrange outpt DCCV   (previously on flecainide but stopped 08/14/22-when rate control discussed)  HR 120s on exam.  Anticoagulation would continue with DCCV last week on 09/15/22.  He has streaks of blood in sputum.  Monitor Hgb stable.  CM on echo last week, 45% EF may improve if he can maintain [name].   Dr Lovena Le had discussed treatment options with pt  and rhythm control was planned.  On Echo  LA mod. Dilated.  .   Acute hypoxic respiratory failure secondary to HAP,  severe sepsis per IM with improved WBC on ABX. Mild  anemia/HTN now soft/hypothyroidism/HDL on statin per IM.   Risk Assessment/Risk Scores:          CHA2DS2-VASc Score = 1   This indicates a 0.6% annual risk of stroke. The patient's score is based upon: CHF History: 0 HTN History: 1 Diabetes History: 0 Stroke History: 0 Vascular Disease History: 0 Age Score: 0 Gender Score: 0          For questions or updates, please contact [place] Please consult [email or web address] for contact info under    Signed, Cecilie Kicks, NP  09/19/2022 8:12 AM

## 2022-09-20 DIAGNOSIS — J029 Acute pharyngitis, unspecified: Secondary | ICD-10-CM | POA: Insufficient documentation

## 2022-09-20 DIAGNOSIS — R042 Hemoptysis: Secondary | ICD-10-CM | POA: Diagnosis not present

## 2022-09-20 DIAGNOSIS — J9601 Acute respiratory failure with hypoxia: Secondary | ICD-10-CM | POA: Diagnosis not present

## 2022-09-20 DIAGNOSIS — I482 Chronic atrial fibrillation, unspecified: Secondary | ICD-10-CM | POA: Diagnosis not present

## 2022-09-20 DIAGNOSIS — D509 Iron deficiency anemia, unspecified: Secondary | ICD-10-CM

## 2022-09-20 DIAGNOSIS — Y95 Nosocomial condition: Secondary | ICD-10-CM | POA: Diagnosis not present

## 2022-09-20 DIAGNOSIS — R9431 Abnormal electrocardiogram [ECG] [EKG]: Secondary | ICD-10-CM | POA: Insufficient documentation

## 2022-09-20 DIAGNOSIS — R110 Nausea: Secondary | ICD-10-CM | POA: Insufficient documentation

## 2022-09-20 DIAGNOSIS — J189 Pneumonia, unspecified organism: Secondary | ICD-10-CM | POA: Diagnosis not present

## 2022-09-20 DIAGNOSIS — I10 Essential (primary) hypertension: Secondary | ICD-10-CM | POA: Diagnosis not present

## 2022-09-20 DIAGNOSIS — E890 Postprocedural hypothyroidism: Secondary | ICD-10-CM | POA: Diagnosis not present

## 2022-09-20 LAB — IRON AND TIBC
Iron: 26 ug/dL — ABNORMAL LOW (ref 45–182)
Saturation Ratios: 8 % — ABNORMAL LOW (ref 17.9–39.5)
TIBC: 332 ug/dL (ref 250–450)
UIBC: 306 ug/dL

## 2022-09-20 LAB — RENAL FUNCTION PANEL
Albumin: 2.9 g/dL — ABNORMAL LOW (ref 3.5–5.0)
Anion gap: 7 (ref 5–15)
BUN: <5 mg/dL — ABNORMAL LOW (ref 8–23)
CO2: 29 mmol/L (ref 22–32)
Calcium: 8.1 mg/dL — ABNORMAL LOW (ref 8.9–10.3)
Chloride: 98 mmol/L (ref 98–111)
Creatinine, Ser: 0.73 mg/dL (ref 0.61–1.24)
GFR, Estimated: >60 mL/min (ref >60)
Glucose, Bld: 114 mg/dL — ABNORMAL HIGH (ref 70–99)
Phosphorus: 3.3 mg/dL (ref 2.5–4.6)
Potassium: 3.8 mmol/L (ref 3.5–5.1)
Sodium: 134 mmol/L — ABNORMAL LOW (ref 135–145)

## 2022-09-20 LAB — CBC
HCT: 38.4 % — ABNORMAL LOW (ref 39.0–52.0)
Hemoglobin: 13.2 g/dL (ref 13.0–17.0)
MCH: 31.2 pg (ref 26.0–34.0)
MCHC: 34.4 g/dL (ref 30.0–36.0)
MCV: 90.8 fL (ref 80.0–100.0)
Platelets: 231 10*3/uL (ref 150–400)
RBC: 4.23 MIL/uL (ref 4.22–5.81)
RDW: 12.4 % (ref 11.5–15.5)
WBC: 14 10*3/uL — ABNORMAL HIGH (ref 4.0–10.5)
nRBC: 0 % (ref 0.0–0.2)

## 2022-09-20 LAB — BRAIN NATRIURETIC PEPTIDE: B Natriuretic Peptide: 411.4 pg/mL — ABNORMAL HIGH (ref 0.0–100.0)

## 2022-09-20 LAB — RETICULOCYTES
Immature Retic Fract: 12.6 % (ref 2.3–15.9)
RBC.: 4.36 MIL/uL (ref 4.22–5.81)
Retic Count, Absolute: 54.9 10*3/uL (ref 19.0–186.0)
Retic Ct Pct: 1.3 % (ref 0.4–3.1)

## 2022-09-20 LAB — VANCOMYCIN, PEAK: Vancomycin Pk: 29 ug/mL — ABNORMAL LOW (ref 30–40)

## 2022-09-20 LAB — MAGNESIUM: Magnesium: 1.8 mg/dL (ref 1.7–2.4)

## 2022-09-20 LAB — VITAMIN B12: Vitamin B-12: 583 pg/mL (ref 180–914)

## 2022-09-20 LAB — FOLATE
Folate: 17.6 ng/mL (ref >5.9)
Folate: 18.6 ng/mL (ref >5.9)

## 2022-09-20 LAB — FERRITIN: Ferritin: 84 ng/mL (ref 24–336)

## 2022-09-20 MED ORDER — MENTHOL 3 MG MT LOZG: 1.0000 | LOZENGE | OROMUCOSAL | Status: DC | PRN

## 2022-09-20 MED ORDER — SODIUM CHLORIDE 0.9 % IV SOLN
2.0000 g | INTRAVENOUS | Status: AC
  Administered 2022-09-20 – 2022-09-23 (×4): 2 g via INTRAVENOUS
  Filled 2022-09-20 (×4): qty 20

## 2022-09-20 MED ORDER — SODIUM CHLORIDE 0.9 % IV SOLN
250.0000 mg | Freq: Every day | INTRAVENOUS | Status: AC
  Administered 2022-09-20 – 2022-09-21 (×2): 250 mg via INTRAVENOUS
  Filled 2022-09-20 (×3): qty 20

## 2022-09-20 MED ORDER — PANTOPRAZOLE SODIUM 40 MG PO TBEC
40.0000 mg | DELAYED_RELEASE_TABLET | Freq: Every day | ORAL | Status: DC
  Administered 2022-09-20 – 2022-09-23 (×4): 40 mg via ORAL
  Filled 2022-09-20 (×4): qty 1

## 2022-09-20 MED ORDER — PHENOL 1.4 % MT LIQD: 1.0000 | OROMUCOSAL | Status: DC | PRN

## 2022-09-20 NOTE — Progress Notes (Addendum)
Pharmacy Antibiotic Note  Jesse NIKOLAI [name] is a 62 y.o. male admitted on 09/17/2022 with pneumonia.  Pharmacy has been consulted for cefepime and vancomycin dosing.  Patient has been on cefepime and vancomycin since 1/4. WBC 23.4>>14, Scr remains stable at 0.73, afebrile. Respiratory cx are growing GPC in pairs and clusters and MRSA PCR (+). Per MD, will de-escalate from cefepime to ceftriaxone and continue vancomycin coverage.   Plan: -Discontinue cefepime. -Start ceftriaxone 2g q24hrs  -Continue IV Vancomycin '1000mg'$  every 12 hours for eAUC405.  -F/u vanc peak and vanc trough 1/7 and 1/8 to adjust dose as needed. -Follow culture data for de-escalation.  -Monitor renal function for dose adjustments as indicated.   Temp (24hrs), Avg:98.2 F (36.8 C), Min:97.9 F (36.6 C), Max:98.3 F (36.8 C)  Recent Labs  Lab 09/15/22 0230 09/17/22 1641 09/17/22 2208 09/18/22 0750 09/19/22 0102 09/20/22 0041  WBC 15.2* 23.4*  --  20.3* 13.8* 14.0*  CREATININE 1.11 0.85  --  0.65 0.82 0.73  LATICACIDVEN  --   --  1.2 1.5  0.8  --   --      Estimated Creatinine Clearance: 104.4 mL/min (by C-G formula based on SCr of 0.73 mg/dL).    Allergies  Allergen Reactions   Sulfa Drugs Cross Reactors Hives   Codeine Other (See Comments)    Made him very jittery    Hydrochlorothiazide Other (See Comments)    Hyponatremia    Cultures: 1/4 Bcx: NG 1/5 MRSA PCR (+)  1/5 Strep antigen (-) 1/5 Legionella antigen: in process 1/5 sputum cx: GPC in pairs/clusters     Thank you for allowing pharmacy to be a part of this patient's care.  Billey Gosling, PharmD PGY1 Pharmacy Resident 1/7/202411:01 AM

## 2022-09-20 NOTE — Progress Notes (Signed)
PROGRESS NOTE  Jesse ALLNUTT [name] DUK:[id number] DOB: 24-Jan-1961   PCP: Pcp, No  Patient is from: Home.  Lives with his wife  DOA: 09/17/2022 LOS: 2  Chief complaints Chief Complaint  Patient presents with   Hemoptysis     Brief Narrative / Interim history: 62 year old M with PMH of Graves' disease s/p RIA, HFmrEF, PAF on Xarelto, HTN, HLD, anxiety recent hospitalization from 12/29-1/2 for A-fib with RVR in the setting of rhinovirus infection when he underwent cardioversion returning with productive cough with blood on phlegm, DOE and subjective fever, and admitted for acute respiratory failure with hypoxia in the setting of multifocal pneumonia, A-fib with RVR and mild hemoptysis.  Desaturated to 84% on RA while in ED and required 3 L.  HR in 150s.  WBC 23.4 with left shift.  He was tachycardic and tachypneic as well.  CXR with RUL and medial left lung base infiltrate.  Cultures obtained.  Patient was started on vancomycin, cefepime and amiodarone drip.  Anticoagulation initially held due to hemoptysis.  The next day, formal cardiology consultation obtained.  Continued on IV amiodarone, vancomycin and cefepime.  Xarelto resumed.  Blood cultures NGTD.   Subjective: Seen and examined earlier this morning.  No major events overnight of this morning.  Reports nausea but no emesis.  Also reports sore throat.  Denies chest pain or shortness of breath.  Still coughing phlegm with bloody streaks.  No frank red blood hemoptysis.   Objective: Vitals:   09/20/22 0010 09/20/22 0436 09/20/22 0438 09/20/22 0744  BP:   (!) 118/93 (!) 128/94  Pulse:    99  Resp:  18  16  Temp:  98 F (36.7 C)  97.9 F (36.6 C)  TempSrc:  Oral  Oral  SpO2:  98%  95%  Weight: 89.1 kg       Examination:  GENERAL: No apparent distress.  Nontoxic. HEENT: MMM.  Vision and hearing grossly intact.  NECK: Supple.  No apparent JVD.  RESP:  No IWOB.  Fair aeration bilaterally. CVS: Irregular rhythm.  HR in 90s.   Heart sounds normal.  ABD/GI/GU: BS+. Abd soft, NTND.  MSK/EXT:  Moves extremities. No apparent deformity. No edema.  SKIN: no apparent skin lesion or wound NEURO: Awake and alert. Oriented appropriately.  No apparent focal neuro deficit. PSYCH: Somewhat anxious.  Procedures:  None  Microbiology summarized: [id number], influenza and RSV PCR nonreactive Blood cultures NGTD MRSA PCR screen positive. Respiratory culture reintubated for better growth  Assessment and plan: Principal Problem:   HAP (hospital-acquired pneumonia) Active Problems:   Hypothyroidism following radioiodine therapy   Essential hypertension   Dyslipidemia   Chronic atrial fibrillation with RVR (HCC)   Severe sepsis (HCC)   Acute hypoxemic respiratory failure (HCC)   Iron deficiency anemia   Cough with hemoptysis   Tobacco use disorder   Mood disorder (HCC)   Prolonged QT interval   Nausea without vomiting   Sore throat  Severe sepsis with acute hypoxemic respiratory failure due to HAP: POA.  Has leukocytosis, tachycardia, tachypnea with hypoxic respiratory failure on presentation.  He was 84% on RA requiring 3 L in ED. CXR with RUL and medial left lung base infiltrate.  COVID-19, influenza and RSV PCR nonreactive.  Blood cultures NGTD.  MRSA PCR screen positive.  Sepsis physiology improving.  Sputum Gram stain with GPC in pairs and clusters. -Continue vancomycin -Change cefepime to ceftriaxone -Follow sputum culture -Mucolytic's, antitussive -Wean oxygen as able  Paroxysmal A-fib with RVR:  Recently hospitalized for this and had cardioversion.  HR elevated to 150 in ED.  Started on amiodarone drip after discussion between cardiology and EDP.  RVR likely provoked by pneumonia.  HR in 90s. -Continue amiodarone drip per cardiology -Holding Xarelto in the setting of hemoptysis -Optimize K and Mg.    Iron deficiency anemia: H&H stable after initial drop.  Likely hemodilution.  Iron saturation 8%.  Recent Labs     05/25/22 1249 09/11/22 1335 09/12/22 0142 09/13/22 0118 09/14/22 0135 09/15/22 0230 09/17/22 1641 09/18/22 0750 09/19/22 0102 09/20/22 0041  HGB 14.9 14.7 14.2 14.1 14.6 13.8 15.0 12.3* 12.9* 13.2  -IV ferric gluconate 250 mg twice daily -Monitor H&H  Hemoptysis: No frank red blood other than some blood staining on phlegm.  H&H stable. -Continue with Xarelto.  Mild hypocalcemia: Corrects to normal for hypoalbuminemia.   Hypothyroidism: TSH was low (0.26) on labs done 08/31/2022 and dose of Synthroid was adjusted during recent hospitalization per recommendation from endocrinology (Synthroid 200 mg daily except 100 mg on 'Sundays). -Continue Synthroid -Outpatient endocrinology follow-up   Essential hypertension: Normotensive. -Resume home medications gradually starting with metoprolol at half home dose.   Hyperlipidemia -Continue Crestor   HFmrEF: Appears euvolemic.  TTE with LVEF of 40 to 45% in 08/2022.  BNP elevated to 460 which is slightly higher than prior.  Patient is not on diuretics at home. -Monitor fluid and respiratory status.   Mood disorder with anxiety -Continue Zoloft and Ativan as needed  Prolonged QT: QTc 514 -Avoid QT prolonging drugs  Nausea/sore throat -Start PPI, lozenges and Chloraseptic spray -IM Tigan 200 mg every 8 hours as needed   BPH -Continue Flomax  Tobacco use disorder: Reports smoking about 4 to 5 cigarettes a day. -Encouraged smoking cessation.   Body mass index is 29.01 kg/m.          DVT prophylaxis:  Place and maintain sequential compression device Start: 09/19/22 1131 rivaroxaban (XARELTO) tablet 20 mg  Code Status: DNR/DNI Family Communication: None at bedside Level of care: Progressive Status is: Inpatient Remains inpatient appropriate because: Severe sepsis, hypoxic respiratory failure, HAP, A-fib with RVR  Final disposition: Likely home once medically stable Consultants:  Cardiology  Sch Meds:  Scheduled  Meds:  levothyroxine  200 mcg Oral Once per day on Mon Tue Wed Thu Fri Sat   levothyroxine  200 mcg Oral Q Sun   metoprolol tartrate  50 mg Oral BID   montelukast  10 mg Oral QHS   ofloxacin  1 drop Right Eye QID   pantoprazole  40 mg Oral Daily   prednisoLONE acetate  1 drop Right Eye QID   rivaroxaban  20 mg Oral Q supper   rosuvastatin  20 mg Oral Daily   sertraline  50 mg Oral Daily   tamsulosin  0.8 mg Oral Daily   Continuous Infusions:  amiodarone 30 mg/hr (09/20/22 1240)   cefTRIAXone (ROCEPHIN)  IV 2 g (09/20/22 1127)   ferric gluconate (FERRLECIT) IVPB 250 mg (09/20/22 1229)   vancomycin Stopped (09/20/22 1126)   PRN Meds:.acetaminophen **OR** acetaminophen, guaiFENesin, levalbuterol, LORazepam, melatonin, menthol-cetylpyridinium, phenol, trimethobenzamide  Antimicrobials: Anti-infectives (From admission, onward)    Start     Dose/Rate Route Frequency Ordered Stop   09/20/22 1215  cefTRIAXone (ROCEPHIN) 2 g in sodium chloride 0.9 % 100 mL IVPB        2 g 200 mL/hr over 30 Minutes Intravenous Every 24 hours 09/20/22 1115     01'$ /05/24 0800  vancomycin (VANCOCIN)  IVPB 1000 mg/200 mL premix        1,000 mg 200 mL/hr over 60 Minutes Intravenous Every 12 hours 09/17/22 2015     09/17/22 2015  vancomycin (VANCOCIN) IVPB 1000 mg/200 mL premix  Status:  Discontinued        1,000 mg 200 mL/hr over 60 Minutes Intravenous  Once 09/17/22 2011 09/17/22 2014   09/17/22 2015  ceFEPIme (MAXIPIME) 2 g in sodium chloride 0.9 % 100 mL IVPB  Status:  Discontinued        2 g 200 mL/hr over 30 Minutes Intravenous  Once 09/17/22 2011 09/17/22 2014   09/17/22 2015  ceFEPIme (MAXIPIME) 2 g in sodium chloride 0.9 % 100 mL IVPB  Status:  Discontinued        2 g 200 mL/hr over 30 Minutes Intravenous Every 8 hours 09/17/22 2014 09/20/22 1115   09/17/22 2015  vancomycin (VANCOCIN) IVPB 1000 mg/200 mL premix       See Hyperspace for full Linked Orders Report.   1,000 mg 200 mL/hr over 60 Minutes  Intravenous  Once 09/17/22 2014 09/18/22 0027   09/17/22 2015  vancomycin (VANCOCIN) IVPB 1000 mg/200 mL premix       See Hyperspace for full Linked Orders Report.   1,000 mg 200 mL/hr over 60 Minutes Intravenous  Once 09/17/22 2014 09/17/22 2308        I have personally reviewed the following labs and images: CBC: Recent Labs  Lab 09/15/22 0230 09/17/22 1641 09/18/22 0750 09/19/22 0102 09/20/22 0041  WBC 15.2* 23.4* 20.3* 13.8* 14.0*  NEUTROABS  --  20.6* 15.9*  --   --   HGB 13.8 15.0 12.3* 12.9* 13.2  HCT 41.9 45.3 37.0* 38.1* 38.4*  MCV 92.5 92.4 91.6 91.1 90.8  PLT 208 281 212 204 231   BMP &GFR Recent Labs  Lab 09/14/22 0135 09/15/22 0230 09/17/22 1641 09/18/22 0750 09/19/22 0059 09/19/22 0102 09/19/22 1214 09/20/22 0041  NA 131* 135 139 137  --  135  --  134*  K 4.3 3.5 4.1 3.5  --  3.7  --  3.8  CL 96* 99 100 100  --  101  --  98  CO2 '25 25 29 29  '$ --  30  --  29  GLUCOSE 115* 96 111* 102*  --  126*  --  114*  BUN 6* '8 14 10  '$ --  <5*  --  <5*  CREATININE 0.86 1.11 0.85 0.65  --  0.82  --  0.73  CALCIUM 8.7* 8.7* 9.2 8.1*  --  8.1*  --  8.1*  MG 1.7 1.7  --   --  1.8  --  2.0 1.8  PHOS 4.0 3.8  --   --   --   --   --  3.3   Estimated Creatinine Clearance: 104.4 mL/min (by C-G formula based on SCr of 0.73 mg/dL). Liver & Pancreas: Recent Labs  Lab 09/17/22 1641 09/20/22 0041  AST 12*  --   ALT 21  --   ALKPHOS 96  --   BILITOT 0.8  --   PROT 7.1  --   ALBUMIN 4.3 2.9*   No results for input(s): "LIPASE", "AMYLASE" in the last 168 hours. No results for input(s): "AMMONIA" in the last 168 hours. Diabetic: No results for input(s): "HGBA1C" in the last 72 hours. Recent Labs  Lab 09/18/22 1344  GLUCAP 115*   Cardiac Enzymes: No results for input(s): "CKTOTAL", "CKMB", "CKMBINDEX", "TROPONINI" in  the last 168 hours. No results for input(s): "PROBNP" in the last 8760 hours. Coagulation Profile: Recent Labs  Lab 09/14/22 1832 09/17/22 1648   INR 1.9* 1.4*   Thyroid Function Tests: No results for input(s): "TSH", "T4TOTAL", "FREET4", "T3FREE", "THYROIDAB" in the last 72 hours. Lipid Profile: No results for input(s): "CHOL", "HDL", "LDLCALC", "TRIG", "CHOLHDL", "LDLDIRECT" in the last 72 hours. Anemia Panel: Recent Labs    09/20/22 0041  VITAMINB12 583  FOLATE 18.6  FERRITIN 84  TIBC 332  IRON 26*  RETICCTPCT 1.3   Urine analysis:    Component Value Date/Time   COLORURINE YELLOW 05/09/2022 1158   APPEARANCEUR HAZY (A) 05/09/2022 1158   LABSPEC 1.016 05/09/2022 1158   PHURINE 5.0 05/09/2022 1158   GLUCOSEU NEGATIVE 05/09/2022 1158   GLUCOSEU NEGATIVE 03/31/2022 1030   HGBUR NEGATIVE 05/09/2022 1158   BILIRUBINUR NEGATIVE 05/09/2022 1158   BILIRUBINUR negative 05/22/2016 1323   BILIRUBINUR neg 03/22/2015 1017   KETONESUR 5 (A) 05/09/2022 1158   PROTEINUR NEGATIVE 05/09/2022 1158   UROBILINOGEN 0.2 03/31/2022 1030   NITRITE NEGATIVE 05/09/2022 1158   LEUKOCYTESUR NEGATIVE 05/09/2022 1158   Sepsis Labs: Invalid input(s): "PROCALCITONIN", "LACTICIDVEN"  Microbiology: Recent Results (from the past 240 hour(s))  Resp panel by RT-PCR (RSV, Flu A&B, Covid) Anterior Nasal Swab     Status: None   Collection Time: 09/11/22  4:32 PM   Specimen: Anterior Nasal Swab  Result Value Ref Range Status   SARS Coronavirus 2 by RT PCR NEGATIVE NEGATIVE Final    Comment: (NOTE) SARS-CoV-2 target nucleic acids are NOT DETECTED.  The SARS-CoV-2 RNA is generally detectable in upper respiratory specimens during the acute phase of infection. The lowest concentration of SARS-CoV-2 viral copies this assay can detect is 138 copies/mL. A negative result does not preclude SARS-Cov-2 infection and should not be used as the sole basis for treatment or other patient management decisions. A negative result may occur with  improper specimen collection/handling, submission of specimen other than nasopharyngeal swab, presence of viral  mutation(s) within the areas targeted by this assay, and inadequate number of viral copies(<138 copies/mL). A negative result must be combined with clinical observations, patient history, and epidemiological information. The expected result is Negative.  Fact Sheet for Patients:  EntrepreneurPulse.com.au  Fact Sheet for Healthcare Providers:  IncredibleEmployment.be  This test is no t yet approved or cleared by the [place] FDA and  has been authorized for detection and/or diagnosis of SARS-CoV-2 by FDA under an Emergency Use Authorization (EUA). This EUA will remain  in effect (meaning this test can be used) for the duration of the COVID-19 declaration under Section 564(b)(1) of the Act, 21 U.S.C.section 360bbb-3(b)(1), unless the authorization is terminated  or revoked sooner.       Influenza A by PCR NEGATIVE NEGATIVE Final   Influenza B by PCR NEGATIVE NEGATIVE Final    Comment: (NOTE) The Xpert Xpress SARS-CoV-2/FLU/RSV plus assay is intended as an aid in the diagnosis of influenza from Nasopharyngeal swab specimens and should not be used as a sole basis for treatment. Nasal washings and aspirates are unacceptable for Xpert Xpress SARS-CoV-2/FLU/RSV testing.  Fact Sheet for Patients: EntrepreneurPulse.com.au  Fact Sheet for Healthcare Providers: IncredibleEmployment.be  This test is not yet approved or cleared by the [place] FDA and has been authorized for detection and/or diagnosis of SARS-CoV-2 by FDA under an Emergency Use Authorization (EUA). This EUA will remain in effect (meaning this test can be used) for the duration of the  COVID-19 declaration under Section 564(b)(1) of the Act, 21 U.S.C. section 360bbb-3(b)(1), unless the authorization is terminated or revoked.     Resp Syncytial Virus by PCR NEGATIVE NEGATIVE Final    Comment: (NOTE) Fact Sheet for  Patients: EntrepreneurPulse.com.au  Fact Sheet for Healthcare Providers: IncredibleEmployment.be  This test is not yet approved or cleared by the [place] FDA and has been authorized for detection and/or diagnosis of SARS-CoV-2 by FDA under an Emergency Use Authorization (EUA). This EUA will remain in effect (meaning this test can be used) for the duration of the COVID-19 declaration under Section 564(b)(1) of the Act, 21 U.S.C. section 360bbb-3(b)(1), unless the authorization is terminated or revoked.  Performed at [facility], [place] [address], [place], [place] [address]   Respiratory (~20 pathogens) panel by PCR     Status: Abnormal   Collection Time: 09/12/22  8:30 AM   Specimen: Nasopharyngeal Swab; Respiratory  Result Value Ref Range Status   Adenovirus NOT DETECTED NOT DETECTED Final   Coronavirus 229E NOT DETECTED NOT DETECTED Final    Comment: (NOTE) The Coronavirus on the Respiratory Panel, DOES NOT test for the novel  Coronavirus (2019 nCoV)    Coronavirus HKU1 NOT DETECTED NOT DETECTED Final   Coronavirus NL63 NOT DETECTED NOT DETECTED Final   Coronavirus OC43 NOT DETECTED NOT DETECTED Final   Metapneumovirus NOT DETECTED NOT DETECTED Final   Rhinovirus / Enterovirus DETECTED (A) NOT DETECTED Final   Influenza A NOT DETECTED NOT DETECTED Final   Influenza B NOT DETECTED NOT DETECTED Final   Parainfluenza Virus 1 NOT DETECTED NOT DETECTED Final   Parainfluenza Virus 2 NOT DETECTED NOT DETECTED Final   Parainfluenza Virus 3 NOT DETECTED NOT DETECTED Final   Parainfluenza Virus 4 NOT DETECTED NOT DETECTED Final   Respiratory Syncytial Virus NOT DETECTED NOT DETECTED Final   Bordetella pertussis NOT DETECTED NOT DETECTED Final   Bordetella Parapertussis NOT DETECTED NOT DETECTED Final   Chlamydophila pneumoniae NOT DETECTED NOT DETECTED Final   Mycoplasma pneumoniae NOT DETECTED NOT DETECTED Final    Comment: Performed at  [facility], [address], [place], [place] [address]  Resp panel by RT-PCR (RSV, Flu A&B, Covid) Anterior Nasal Swab     Status: None   Collection Time: 09/17/22  4:41 PM   Specimen: Anterior Nasal Swab  Result Value Ref Range Status   SARS Coronavirus 2 by RT PCR NEGATIVE NEGATIVE Final    Comment: (NOTE) SARS-CoV-2 target nucleic acids are NOT DETECTED.  The SARS-CoV-2 RNA is generally detectable in upper respiratory specimens during the acute phase of infection. The lowest concentration of SARS-CoV-2 viral copies this assay can detect is 138 copies/mL. A negative result does not preclude SARS-Cov-2 infection and should not be used as the sole basis for treatment or other patient management decisions. A negative result may occur with  improper specimen collection/handling, submission of specimen other than nasopharyngeal swab, presence of viral mutation(s) within the areas targeted by this assay, and inadequate number of viral copies(<138 copies/mL). A negative result must be combined with clinical observations, patient history, and epidemiological information. The expected result is Negative.  Fact Sheet for Patients:  EntrepreneurPulse.com.au  Fact Sheet for Healthcare Providers:  IncredibleEmployment.be  This test is no t yet approved or cleared by the [place] FDA and  has been authorized for detection and/or diagnosis of SARS-CoV-2 by FDA under an Emergency Use Authorization (EUA). This EUA will remain  in effect (meaning this test  can be used) for the duration of the COVID-19 declaration under Section 564(b)(1) of the Act, 21 U.S.C.section 360bbb-3(b)(1), unless the authorization is terminated  or revoked sooner.       Influenza A by PCR NEGATIVE NEGATIVE Final   Influenza B by PCR NEGATIVE NEGATIVE Final    Comment: (NOTE) The Xpert Xpress SARS-CoV-2/FLU/RSV plus assay is intended as an aid in the diagnosis of  influenza from Nasopharyngeal swab specimens and should not be used as a sole basis for treatment. Nasal washings and aspirates are unacceptable for Xpert Xpress SARS-CoV-2/FLU/RSV testing.  Fact Sheet for Patients: EntrepreneurPulse.com.au  Fact Sheet for Healthcare Providers: IncredibleEmployment.be  This test is not yet approved or cleared by the [place] FDA and has been authorized for detection and/or diagnosis of SARS-CoV-2 by FDA under an Emergency Use Authorization (EUA). This EUA will remain in effect (meaning this test can be used) for the duration of the COVID-19 declaration under Section 564(b)(1) of the Act, 21 U.S.C. section 360bbb-3(b)(1), unless the authorization is terminated or revoked.     Resp Syncytial Virus by PCR NEGATIVE NEGATIVE Final    Comment: (NOTE) Fact Sheet for Patients: EntrepreneurPulse.com.au  Fact Sheet for Healthcare Providers: IncredibleEmployment.be  This test is not yet approved or cleared by the [place] FDA and has been authorized for detection and/or diagnosis of SARS-CoV-2 by FDA under an Emergency Use Authorization (EUA). This EUA will remain in effect (meaning this test can be used) for the duration of the COVID-19 declaration under Section 564(b)(1) of the Act, 21 U.S.C. section 360bbb-3(b)(1), unless the authorization is terminated or revoked.  Performed at [facility], [address] [place], [place] [address]   Blood Culture (routine x 2)     Status: None (Preliminary result)   Collection Time: 09/17/22  8:35 PM   Specimen: BLOOD  Result Value Ref Range Status   Specimen Description   Final    BLOOD RIGHT ANTECUBITAL Performed at [facility], [address] [place], [place] [address]    Special Requests   Final    BOTTLES DRAWN AEROBIC AND ANAEROBIC Blood Culture adequate volume Performed at [facility], [address] [place], [place] [address]    Culture   Final    NO GROWTH 3 DAYS Performed at [facility], [place] [address], [place], [place] [address]    Report Status PENDING  Incomplete  Blood Culture (routine x 2)     Status: None (Preliminary result)   Collection Time: 09/17/22  8:38 PM   Specimen: BLOOD  Result Value Ref Range Status   Specimen Description   Final    BLOOD LEFT ANTECUBITAL Performed at [facility], [address], [place], [place] [address]    Special Requests   Final    BOTTLES DRAWN AEROBIC AND ANAEROBIC Blood Culture adequate volume Performed at [facility], [address] [place], [place] [address]    Culture   Final    NO GROWTH 3 DAYS Performed at [facility], [place] [address] [place], [place] [address]    Report Status PENDING  Incomplete  MRSA Next Gen by PCR, Nasal     Status: Abnormal   Collection Time: 09/18/22  8:45 PM   Specimen: Nasal Mucosa; Nasal Swab  Result Value Ref Range Status   MRSA by PCR Next Gen DETECTED (A) NOT DETECTED Final    Comment: RESULT CALLED TO, READ BACK BY AND VERIFIED WITH: F JONES,RN'@[id number]'$   09/19/22 [place] (NOTE) The GeneXpert MRSA Assay (FDA approved for NASAL specimens only), is one component of a comprehensive MRSA colonization surveillance program. It is not intended to diagnose MRSA infection nor to guide or monitor treatment for MRSA infections. Test performance is not FDA approved in patients less than 2 years old. Performed at [facility], [place] [address], [place], [place] [address]   Expectorated Sputum Assessment w Gram Stain, Rflx to Resp Cult     Status: None   Collection Time: 09/18/22  8:46 PM   Specimen: Sputum  Result Value Ref Range Status   Specimen Description SPUTUM  Final   Special Requests NONE  Final   Sputum evaluation   Final    THIS SPECIMEN IS ACCEPTABLE FOR SPUTUM CULTURE Performed at [facility], [place] [address] [place], [place] [address]    Report Status 09/19/2022 FINAL  Final  Culture, Respiratory w Gram Stain     Status: None (Preliminary result)   Collection Time: 09/18/22  8:46 PM   Specimen: SPU  Result Value Ref Range Status   Specimen Description SPUTUM  Final   Special Requests NONE Reflexed from [id number]  Final   Gram Stain   Final    FEW WBC PRESENT, PREDOMINANTLY PMN FEW GRAM POSITIVE COCCI IN PAIRS IN CLUSTERS    Culture   Final    CULTURE REINCUBATED FOR BETTER GROWTH Performed at [facility], [place] [address], [place], [place] [address]    Report Status PENDING  Incomplete    Radiology Studies: No results found.     T. Buchanan  If 7PM-7AM, please contact night-coverage [email or web address] 09/20/2022, 12:53 PM

## 2022-09-20 NOTE — Progress Notes (Signed)
Rounding Note    Patient Name: Jesse MURATA [name] Date of Encounter: 09/20/2022  [facility] Cardiologist: Cristopher Peru, MD   Subjective   No acute events overnight. Still coughing but feels that he is slowly improving. Reviewed sputum in cup, has some hemoptysis but not high quantity. Heart rates improved but still afib. No chest pain. Still short of breath but improved from yesterday.  Inpatient Medications    Scheduled Meds:  levothyroxine  200 mcg Oral Once per day on Mon Tue Wed Thu Fri Sat   levothyroxine  200 mcg Oral Q Sun   metoprolol tartrate  50 mg Oral BID   montelukast  10 mg Oral QHS   ofloxacin  1 drop Right Eye QID   pantoprazole  40 mg Oral Daily   prednisoLONE acetate  1 drop Right Eye QID   rivaroxaban  20 mg Oral Q supper   rosuvastatin  20 mg Oral Daily   sertraline  50 mg Oral Daily   tamsulosin  0.8 mg Oral Daily   Continuous Infusions:  amiodarone 30 mg/hr (09/20/22 0106)   cefTRIAXone (ROCEPHIN)  IV 2 g (09/20/22 1127)   ferric gluconate (FERRLECIT) IVPB     vancomycin 1,000 mg (09/20/22 0752)   PRN Meds: acetaminophen **OR** acetaminophen, guaiFENesin, levalbuterol, LORazepam, melatonin, menthol-cetylpyridinium, phenol, trimethobenzamide   Vital Signs    Vitals:   09/20/22 0010 09/20/22 0436 09/20/22 0438 09/20/22 0744  BP:   (!) 118/93 (!) 128/94  Pulse:    99  Resp:  18  16  Temp:  98 F (36.7 C)  97.9 F (36.6 C)  TempSrc:  Oral  Oral  SpO2:  98%  95%  Weight: 89.1 kg       Intake/Output Summary (Last 24 hours) at 09/20/2022 1211 Last data filed at 09/20/2022 1037 Gross per 24 hour  Intake 1240.98 ml  Output 2800 ml  Net -1559.02 ml      09/20/2022   12:10 AM 09/19/2022    5:41 AM 09/11/2022    1:23 PM  Last 3 Weights  Weight (lbs) 196 lb 6.9 oz 195 lb 4.8 oz 193 lb  Weight (kg) 89.1 kg 88.587 kg 87.544 kg      Telemetry    Atrial fibrillation, rates average 80s-90s - Personally Reviewed  ECG    No new since  1/6 - Personally Reviewed  Physical Exam   GEN: Well nourished, well developed in no acute distress HEENT: Normal, moist mucous membranes NECK: No JVD CARDIAC: irregularly irregular rhythm, normal S1 and S2, no rubs or gallops. No murmur. VASCULAR: Radial and DP pulses 2+ bilaterally. No carotid bruits RESPIRATORY:  coarse on left side but slightly improved from yesterday ABDOMEN: Soft, non-tender, non-distended MUSCULOSKELETAL:  Ambulates independently SKIN: Warm and dry, no edema NEUROLOGIC:  Alert and oriented x 3. No focal neuro deficits noted. PSYCHIATRIC:  Normal affect    Labs    High Sensitivity Troponin:   Recent Labs  Lab 09/11/22 1335 09/11/22 1529  TROPONINIHS 5 5     Chemistry Recent Labs  Lab 09/17/22 1641 09/18/22 0750 09/19/22 0059 09/19/22 0102 09/19/22 1214 09/20/22 0041  NA 139 137  --  135  --  134*  K 4.1 3.5  --  3.7  --  3.8  CL 100 100  --  101  --  98  CO2 29 29  --  30  --  29  GLUCOSE 111* 102*  --  126*  --  114*  BUN 14 10  --  <5*  --  <5*  CREATININE 0.85 0.65  --  0.82  --  0.73  CALCIUM 9.2 8.1*  --  8.1*  --  8.1*  MG  --   --  1.8  --  2.0 1.8  PROT 7.1  --   --   --   --   --   ALBUMIN 4.3  --   --   --   --  2.9*  AST 12*  --   --   --   --   --   ALT 21  --   --   --   --   --   ALKPHOS 96  --   --   --   --   --   BILITOT 0.8  --   --   --   --   --   GFRNONAA >60 >60  --  >60  --  >60  ANIONGAP 10 8  --  4*  --  7    Lipids No results for input(s): "CHOL", "TRIG", "HDL", "LABVLDL", "LDLCALC", "CHOLHDL" in the last 168 hours.  Hematology Recent Labs  Lab 09/18/22 0750 09/19/22 0102 09/20/22 0041  WBC 20.3* 13.8* 14.0*  RBC 4.04* 4.18* 4.23  4.36  HGB 12.3* 12.9* 13.2  HCT 37.0* 38.1* 38.4*  MCV 91.6 91.1 90.8  MCH 30.4 30.9 31.2  MCHC 33.2 33.9 34.4  RDW 13.0 12.7 12.4  PLT 212 204 231   Thyroid No results for input(s): "TSH", "FREET4" in the last 168 hours.  BNP Recent Labs  Lab 09/19/22 0102  09/20/22 0041  BNP 458.8* 411.4*    DDimer No results for input(s): "DDIMER" in the last 168 hours.   Radiology    No results found.  Cardiac Studies   No new this admission, prior studies reviewed  Patient Profile     62 y.o. male with a hx of  PAF, grave's disease, HTN, + PVCs who is being followed for atrial fib with RVR in combination with PNA at the request of Dr. Cyndia Skeeters.   Assessment & Plan    Atrial fibrillation with RVR -continue IV amiodarone while acutely ill. I suspect he may spontaneously convert. Rate control much improved. -had cardioversion 09/15/22. I would continue rivaroxaban given his recent cardioversion. Stop if there is major bleeding only, Hgb stable today   Cardiomyopathy -EF 40-45% on most recent echo -would allow him to be in sinus rhythm as an outpatient, then consider recheck   Bilateral pneumonia, hospital acquired given recent procedure Acute hypoxic respiratory failure -per primary team  For questions or updates, please contact [place] Please consult [email or web address] for contact info under        Signed, Buford Dresser, MD  09/20/2022, 12:11 PM

## 2022-09-21 DIAGNOSIS — E890 Postprocedural hypothyroidism: Secondary | ICD-10-CM | POA: Diagnosis not present

## 2022-09-21 DIAGNOSIS — I10 Essential (primary) hypertension: Secondary | ICD-10-CM | POA: Diagnosis not present

## 2022-09-21 DIAGNOSIS — I4819 Other persistent atrial fibrillation: Secondary | ICD-10-CM | POA: Diagnosis not present

## 2022-09-21 DIAGNOSIS — J9601 Acute respiratory failure with hypoxia: Secondary | ICD-10-CM | POA: Diagnosis not present

## 2022-09-21 DIAGNOSIS — J189 Pneumonia, unspecified organism: Secondary | ICD-10-CM | POA: Diagnosis not present

## 2022-09-21 LAB — RENAL FUNCTION PANEL
Albumin: 2.9 g/dL — ABNORMAL LOW (ref 3.5–5.0)
Anion gap: 7 (ref 5–15)
BUN: <5 mg/dL — ABNORMAL LOW (ref 8–23)
CO2: 28 mmol/L (ref 22–32)
Calcium: 8.3 mg/dL — ABNORMAL LOW (ref 8.9–10.3)
Chloride: 99 mmol/L (ref 98–111)
Creatinine, Ser: 0.75 mg/dL (ref 0.61–1.24)
GFR, Estimated: >60 mL/min (ref >60)
Glucose, Bld: 96 mg/dL (ref 70–99)
Phosphorus: 3.3 mg/dL (ref 2.5–4.6)
Potassium: 3.5 mmol/L (ref 3.5–5.1)
Sodium: 134 mmol/L — ABNORMAL LOW (ref 135–145)

## 2022-09-21 LAB — CBC
HCT: 40.3 % (ref 39.0–52.0)
Hemoglobin: 13.1 g/dL (ref 13.0–17.0)
MCH: 30 pg (ref 26.0–34.0)
MCHC: 32.5 g/dL (ref 30.0–36.0)
MCV: 92.4 fL (ref 80.0–100.0)
Platelets: 226 10*3/uL (ref 150–400)
RBC: 4.36 MIL/uL (ref 4.22–5.81)
RDW: 12.3 % (ref 11.5–15.5)
WBC: 11.6 10*3/uL — ABNORMAL HIGH (ref 4.0–10.5)
nRBC: 0 % (ref 0.0–0.2)

## 2022-09-21 LAB — LEGIONELLA PNEUMOPHILA SEROGP 1 UR AG: L. pneumophila Serogp 1 Ur Ag: NEGATIVE

## 2022-09-21 LAB — MAGNESIUM: Magnesium: 1.9 mg/dL (ref 1.7–2.4)

## 2022-09-21 LAB — CULTURE, RESPIRATORY W GRAM STAIN: Culture: NORMAL

## 2022-09-21 LAB — VANCOMYCIN, TROUGH: Vancomycin Tr: 10 ug/mL — ABNORMAL LOW (ref 15–20)

## 2022-09-21 LAB — CALCIUM, IONIZED: Calcium, Ionized, Serum: 4.7 mg/dL (ref 4.5–5.6)

## 2022-09-21 MED ORDER — LOSARTAN POTASSIUM 50 MG PO TABS
50.0000 mg | ORAL_TABLET | Freq: Every day | ORAL | Status: DC
  Administered 2022-09-21 – 2022-09-23 (×3): 50 mg via ORAL
  Filled 2022-09-21 (×3): qty 1

## 2022-09-21 MED ORDER — LEVOTHYROXINE SODIUM 100 MCG PO TABS: 1000.0000 ug | ORAL_TABLET | ORAL | Status: DC

## 2022-09-21 MED ORDER — AMIODARONE HCL 200 MG PO TABS: 200.0000 mg | ORAL_TABLET | Freq: Every day | ORAL | Status: DC

## 2022-09-21 MED ORDER — LEVOTHYROXINE SODIUM 100 MCG PO TABS: 100.0000 ug | ORAL_TABLET | ORAL | Status: DC

## 2022-09-21 MED ORDER — VANCOMYCIN HCL 1250 MG/250ML IV SOLN
1250.0000 mg | Freq: Two times a day (BID) | INTRAVENOUS | Status: AC
  Administered 2022-09-21 – 2022-09-23 (×4): 1250 mg via INTRAVENOUS
  Filled 2022-09-21 (×4): qty 250

## 2022-09-21 MED ORDER — CHLORHEXIDINE GLUCONATE CLOTH 2 % EX PADS: 6.0000 | MEDICATED_PAD | Freq: Every day | CUTANEOUS | Status: DC

## 2022-09-21 MED ORDER — MUPIROCIN 2 % EX OINT
1.0000 | TOPICAL_OINTMENT | Freq: Two times a day (BID) | CUTANEOUS | Status: DC
  Administered 2022-09-21 – 2022-09-23 (×5): 1 via NASAL
  Filled 2022-09-21 (×2): qty 22

## 2022-09-21 MED ORDER — AMIODARONE HCL 200 MG PO TABS
400.0000 mg | ORAL_TABLET | Freq: Two times a day (BID) | ORAL | Status: DC
  Administered 2022-09-21 – 2022-09-23 (×5): 400 mg via ORAL
  Filled 2022-09-21 (×5): qty 2

## 2022-09-21 NOTE — Progress Notes (Signed)
Rounding Note    Patient Name: Jesse FRIEDT [name] Date of Encounter: 09/21/2022  [facility] Cardiologist: Cristopher Peru, MD   Subjective   No CP or dyspnea  Inpatient Medications    Scheduled Meds:  [START ON 09/27/2022] levothyroxine  100 mcg Oral Q Sun   levothyroxine  200 mcg Oral Once per day on Mon Tue Wed Thu Fri Sat   metoprolol tartrate  50 mg Oral BID   montelukast  10 mg Oral QHS   ofloxacin  1 drop Right Eye QID   pantoprazole  40 mg Oral Daily   prednisoLONE acetate  1 drop Right Eye QID   rivaroxaban  20 mg Oral Q supper   rosuvastatin  20 mg Oral Daily   sertraline  50 mg Oral Daily   tamsulosin  0.8 mg Oral Daily   Continuous Infusions:  amiodarone 30 mg/hr (09/21/22 0720)   cefTRIAXone (ROCEPHIN)  IV 2 g (09/20/22 1127)   ferric gluconate (FERRLECIT) IVPB Stopped (09/20/22 1452)   vancomycin     PRN Meds: acetaminophen **OR** acetaminophen, guaiFENesin, levalbuterol, LORazepam, melatonin, menthol-cetylpyridinium, phenol, trimethobenzamide   Vital Signs    Vitals:   09/20/22 2017 09/21/22 0100 09/21/22 0503 09/21/22 0848  BP: (!) 147/95  (!) 148/94 (!) 148/99  Pulse:   88 (!) 117  Resp: '18  18 20  '$ Temp: (!) 97.4 F (36.3 C)  97.7 F (36.5 C)   TempSrc: Oral  Oral   SpO2:   94%   Weight:  88 kg      Intake/Output Summary (Last 24 hours) at 09/21/2022 1037 Last data filed at 09/21/2022 0758 Gross per 24 hour  Intake 2983.26 ml  Output 2950 ml  Net 33.26 ml      09/21/2022    1:00 AM 09/20/2022   12:10 AM 09/19/2022    5:41 AM  Last 3 Weights  Weight (lbs) 194 lb 0.1 oz 196 lb 6.9 oz 195 lb 4.8 oz  Weight (kg) 88 kg 89.1 kg 88.587 kg      Telemetry    Atrial fibrillation rate controlled - Personally Reviewed   Physical Exam   GEN: No acute distress.   Neck: No JVD Cardiac: irregular Respiratory: Clear to auscultation bilaterally. GI: Soft, nontender, non-distended  MS: No edema Neuro:  Nonfocal  Psych: Normal affect    Labs    High Sensitivity Troponin:   Recent Labs  Lab 09/11/22 1335 09/11/22 1529  TROPONINIHS 5 5     Chemistry Recent Labs  Lab 09/17/22 1641 09/18/22 0750 09/19/22 0102 09/19/22 1214 09/20/22 0041 09/21/22 0034  NA 139   < > 135  --  134* 134*  K 4.1   < > 3.7  --  3.8 3.5  CL 100   < > 101  --  98 99  CO2 29   < > 30  --  29 28  GLUCOSE 111*   < > 126*  --  114* 96  BUN 14   < > <5*  --  <5* <5*  CREATININE 0.85   < > 0.82  --  0.73 0.75  CALCIUM 9.2   < > 8.1*  --  8.1* 8.3*  MG  --    < >  --  2.0 1.8 1.9  PROT 7.1  --   --   --   --   --   ALBUMIN 4.3  --   --   --  2.9* 2.9*  AST  12*  --   --   --   --   --   ALT 21  --   --   --   --   --   ALKPHOS 96  --   --   --   --   --   BILITOT 0.8  --   --   --   --   --   GFRNONAA >60   < > >60  --  >60 >60  ANIONGAP 10   < > 4*  --  7 7   < > = values in this interval not displayed.     Hematology Recent Labs  Lab 09/19/22 0102 09/20/22 0041 09/21/22 0034  WBC 13.8* 14.0* 11.6*  RBC 4.18* 4.23  4.36 4.36  HGB 12.9* 13.2 13.1  HCT 38.1* 38.4* 40.3  MCV 91.1 90.8 92.4  MCH 30.9 31.2 30.0  MCHC 33.9 34.4 32.5  RDW 12.7 12.4 12.3  PLT 204 231 226   BNP Recent Labs  Lab 09/19/22 0102 09/20/22 0041  BNP 458.8* 411.4*     Patient Profile     62 y.o. male with past medical history of paroxysmal atrial fibrillation, Graves' disease, hypertension admitted with pneumonia for evaluation of atrial fibrillation.  Echocardiogram December 2023 showed ejection fraction 40 to 45%, mild left ventricular hypertrophy, mild right ventricular enlargement, moderate biatrial enlargement, mild mitral regurgitation.  Assessment & Plan    1 persistent atrial fibrillation-patient remains in atrial fibrillation this morning.  He underwent successful cardioversion 1 week ago but atrial fibrillation recurred in the setting of pneumonia.  He is presently on IV amiodarone.  Will transition to 400 mg twice daily for 1 week  then 200 mg daily thereafter.  Continue metoprolol. Continue xarelto.  Patient can be discharged from a cardiac standpoint.  Will plan cardioversion in 4 to 6 weeks once completely recovered from pneumonia.  He needs follow-up with Dr. Lovena Le.  Question consideration of ablation to avoid long-term amiodarone.  2 cardiomyopathy-LV function mildly reduced.  Would plan to repeat echocardiogram once patient has been in sinus for 3 months.  If LV function remains decreased would consider cardiac CTA.  3 pneumonia-Per primary service.  4 hypertension-blood pressure is elevated.  Will add losartan 50 mg daily particularly in light of cardiomyopathy.  Advance as needed.  5 hyperlipidemia-continue Crestor.  For questions or updates, please contact [place] Please consult [email or web address] for contact info under        Signed, Kirk Ruths, MD  09/21/2022, 10:37 AM

## 2022-09-21 NOTE — Plan of Care (Signed)
  Problem: Health Behavior/Discharge Planning: Goal: Ability to manage health-related needs will improve Outcome: Progressing   Problem: Clinical Measurements: Goal: Respiratory complications will improve Outcome: Progressing   Problem: Activity: Goal: Risk for activity intolerance will decrease Outcome: Progressing   Problem: Nutrition: Goal: Adequate nutrition will be maintained Outcome: Progressing   Problem: Coping: Goal: Level of anxiety will decrease Outcome: Progressing   Problem: Elimination: Goal: Will not experience complications related to bowel motility Outcome: Progressing Goal: Will not experience complications related to urinary retention Outcome: Progressing   Problem: Pain Managment: Goal: General experience of comfort will improve Outcome: Progressing   Problem: Safety: Goal: Ability to remain free from injury will improve Outcome: Progressing

## 2022-09-21 NOTE — Progress Notes (Addendum)
Pharmacy Antibiotic Note  Jesse Suarez [name] is a 62 y.o. male admitted on 09/17/2022 with pneumonia.  Pharmacy has been consulted for cefepime and vancomycin dosing.  Patient has been on cefepime and vancomycin since 1/4. WBC 23.4>>11.6, Scr remains stable at 0.75, afebrile. Respiratory cx are growing GPC in pairs and clusters and MRSA PCR (+). Per MD, will de-escalate from cefepime to ceftriaxone and continue vancomycin coverage.   Vanc levels indicate AUC of 415, which is on low end of therapeutic (Goal 400-550 mcg-h/mL). Estimated AUC on '1250mg'$  q12 hr = 518.   Plan:  Continue ceftriaxone 2g q24hrs  Increase Vancomycin to 1250 mg q12 hr, add 7 day end date per consult   Monitor cultures, clinical status, renal function, vancomycin level Narrow abx as able and f/u duration    Temp (24hrs), Avg:97.8 F (36.6 C), Min:97.4 F (36.3 C), Max:98.3 F (36.8 C)  Recent Labs  Lab 09/17/22 1641 09/17/22 2208 09/18/22 0750 09/19/22 0102 09/20/22 0041 09/20/22 2200 09/21/22 0034 09/21/22 0615  WBC 23.4*  --  20.3* 13.8* 14.0*  --  11.6*  --   CREATININE 0.85  --  0.65 0.82 0.73  --  0.75  --   LATICACIDVEN  --  1.2 1.5  0.8  --   --   --   --   --   VANCOTROUGH  --   --   --   --   --   --   --  10*  VANCOPEAK  --   --   --   --   --  29*  --   --      Estimated Creatinine Clearance: 103.7 mL/min (by C-G formula based on SCr of 0.75 mg/dL).    Allergies  Allergen Reactions   Sulfa Drugs Cross Reactors Hives   Codeine Other (See Comments)    Made him very jittery    Hydrochlorothiazide Other (See Comments)    Hyponatremia   Antimicrobials: Cefepime 1/4 >> 1/7 Vancomycin 1/4 >> 1/10 CTX 1/7 >>   1/7 VP 29, VT 10 - AUC 415 >> increase vancomycin 1g q12 hr to '1250mg'$  q 12hr   Microbiology  1/4 Covid/flu neg 1/4 Bcx: NG 1/5 Legionella antigen: in process 1/5 Strep antigen (-) 1/5 MRSA PCR (+) 1/5 sputum cx: few GPC in pairs/clusters     Thank you for allowing pharmacy  to be a part of this patient's care.  Benetta Spar, PharmD, BCPS, BCCP Clinical Pharmacist  Please check AMION for all [place] phone numbers After 10:00 PM, call [place] [phone]

## 2022-09-21 NOTE — Progress Notes (Signed)
   09/21/22 1522  Mobility  Activity Ambulated with assistance in hallway  Level of Assistance Contact guard assist, steadying assist  Assistive Device None  Distance Ambulated (ft) 400 ft  Activity Response Tolerated well  Mobility Referral Yes  $Mobility charge 1 Mobility   Mobility Specialist Progress Note  Pre-Mobility: 101 HR,150/93 BP, 95% SpO2 During Mobility: 115 HR, 93% SpO2 Post-Mobility: 107 HR  Pt in bed and agreeable. Felt unsteady standing up from bedside . Had no c/o pain throughout walk. Returned EOB w/ all needs met and call bell in reach.   Jesse Suarez Mobility Specialist  Please contact via SecureChat or Rehab office at [phone]

## 2022-09-21 NOTE — Plan of Care (Signed)

## 2022-09-21 NOTE — Progress Notes (Signed)
PROGRESS NOTE  Jesse Suarez [name] QMV:[id number] DOB: 10/11/1960   PCP: Pcp, No  Patient is from: Home.  Lives with his wife  DOA: 09/17/2022 LOS: 3  Chief complaints Chief Complaint  Patient presents with   Hemoptysis     Brief Narrative / Interim history: 62 year old M with PMH of Graves' disease s/p RIA, HFmrEF, PAF on Xarelto, HTN, HLD, anxiety recent hospitalization from 12/29-1/2 for A-fib with RVR in the setting of rhinovirus infection when he underwent cardioversion returning with productive cough with blood on phlegm, DOE and subjective fever, and admitted for acute respiratory failure with hypoxia in the setting of multifocal pneumonia, A-fib with RVR and mild hemoptysis.  Desaturated to 84% on RA while in ED and required 3 L.  HR in 150s.  WBC 23.4 with left shift.  He was tachycardic and tachypneic as well.  CXR with RUL and medial left lung base infiltrate.  Cultures obtained.  Patient was started on vancomycin, cefepime and amiodarone drip.  Anticoagulation initially held due to hemoptysis.  The next day, formal cardiology consultation obtained.  Continued on IV amiodarone, vancomycin and cefepime.  Xarelto resumed.  Blood cultures NGTD.    Sputum culture with normal flora.  Hemoptysis improved.  Transition to p.o. amiodarone for A-fib.  Cardiology following.  Subjective: And examined earlier this morning.  No major events overnight of this morning.  Reports improvement in his breathing and hemoptysis.  Denies chest pain.  Feels anxious.  Objective: Vitals:   09/21/22 0100 09/21/22 0503 09/21/22 0848 09/21/22 1120  BP:  (!) 148/94 (!) 148/99 107/75  Pulse:  88 (!) 117 78  Resp:  '18 20 20  '$ Temp:  97.7 F (36.5 C)  98.6 F (37 C)  TempSrc:  Oral  Oral  SpO2:  94%  95%  Weight: 88 kg       Examination:   GENERAL: No apparent distress.  Nontoxic. HEENT: MMM.  Vision and hearing grossly intact.  NECK: Supple.  No apparent JVD.  RESP:  No IWOB.  Fair aeration  bilaterally. CVS: Irregular exam.  HR in 100s.  Heart sounds normal.  ABD/GI/GU: BS+. Abd soft, NTND.  MSK/EXT:  Moves extremities. No apparent deformity. No edema.  SKIN: no apparent skin lesion or wound NEURO: Awake and alert. Oriented appropriately.  No apparent focal neuro deficit. PSYCH: Some anxiety.  Procedures:  None  Microbiology summarized: [id number], influenza and RSV PCR nonreactive Blood cultures NGTD MRSA PCR screen positive. Respiratory culture reintubated for better growth  Assessment and plan: Principal Problem:   HAP (hospital-acquired pneumonia) Active Problems:   Hypothyroidism following radioiodine therapy   Essential hypertension   Dyslipidemia   Chronic atrial fibrillation with RVR (HCC)   Severe sepsis (HCC)   Acute hypoxemic respiratory failure (HCC)   Iron deficiency anemia   Cough with hemoptysis   Tobacco use disorder   Mood disorder (HCC)   Prolonged QT interval   Nausea without vomiting   Sore throat  Severe sepsis with acute hypoxemic respiratory failure due to HAP: POA.  Has leukocytosis, tachycardia, tachypnea with hypoxic respiratory failure on presentation.  He was 84% on RA requiring 3 L in ED. CXR with RUL and medial left lung base infiltrate.  COVID-19, influenza and RSV PCR nonreactive.  Blood cultures NGTD.  MRSA PCR screen positive.  Sepsis physiology improving.  Sputum culture with normal flora.  Respiratory failure resolved.  Symptoms improved. -Continue vancomycin and ceftriaxone. -Mucolytic's, antitussive -Wean oxygen as able  Paroxysmal A-fib with  RVR: Recently hospitalized for this and had cardioversion.  HR elevated to 150 in ED.  Started on amiodarone drip after discussion between cardiology and EDP.  RVR likely provoked by pneumonia.  HR in 90s. -Transitioned to p.o. amiodarone 400 mg twice daily -Continue home Xarelto. -Optimize K and Mg.    Iron deficiency anemia: H&H stable after initial drop.  Likely hemodilution.  Iron  saturation 8%.  Recent Labs    09/11/22 1335 09/12/22 0142 09/13/22 0118 09/14/22 0135 09/15/22 0230 09/17/22 1641 09/18/22 0750 09/19/22 0102 09/20/22 0041 09/21/22 0034  HGB 14.7 14.2 14.1 14.6 13.8 15.0 12.3* 12.9* 13.2 13.1  -IV ferric gluconate 250 mg twice daily -Monitor H&H  Hemoptysis: No frank red blood other than some blood staining on phlegm.  Improved.  H&H stable. -Continue with Xarelto.  Mild hypocalcemia: Corrects to normal for hypoalbuminemia.   Hypothyroidism: TSH was low (0.26) on labs done 08/31/2022 and dose of Synthroid was adjusted during recent hospitalization per recommendation from endocrinology (Synthroid 200 mg daily except 100 mg on Sundays). -Continue Synthroid -Outpatient endocrinology follow-up   Essential hypertension: Normotensive. -Resume home medications gradually starting with metoprolol at half home dose.   Hyperlipidemia -Continue Crestor   HFmrEF: Appears euvolemic.  TTE with LVEF of 40 to 45% in 08/2022.  BNP elevated to 460 which is slightly higher than prior.  Patient is not on diuretics at home. -Monitor fluid and respiratory status.   Mood disorder with anxiety -Continue Zoloft and Ativan as needed  Prolonged QT: QTc 514 -Avoid QT prolonging drugs  Nausea/sore throat: Seems to have resolved. -PPI, lozenges and Chloraseptic spray -IM Tigan 200 mg every 8 hours as needed   BPH -Continue Flomax  Tobacco use disorder: Reports smoking about 4 to 5 cigarettes a day. -Encouraged smoking cessation.   Body mass index is 28.65 kg/m.          DVT prophylaxis:  Place and maintain sequential compression device Start: 09/19/22 1131 rivaroxaban (XARELTO) tablet 20 mg  Code Status: DNR/DNI Family Communication: None at bedside Level of care: Telemetry Cardiac Status is: Inpatient Remains inpatient appropriate because: Severe sepsis,  HAP, A-fib with RVR  Final disposition: Likely home once medically stable Consultants:   Cardiology  Sch Meds:  Scheduled Meds:  [START ON 09/28/2022] amiodarone  200 mg Oral Daily   amiodarone  400 mg Oral BID   [START ON 09/22/2022] Chlorhexidine Gluconate Cloth  6 each Topical Q0600   [START ON 09/27/2022] levothyroxine  100 mcg Oral Q Sun   levothyroxine  200 mcg Oral Once per day on Mon Tue Wed Thu Fri Sat   losartan  50 mg Oral Daily   metoprolol tartrate  50 mg Oral BID   montelukast  10 mg Oral QHS   mupirocin ointment  1 Application Nasal BID   ofloxacin  1 drop Right Eye QID   pantoprazole  40 mg Oral Daily   prednisoLONE acetate  1 drop Right Eye QID   rivaroxaban  20 mg Oral Q supper   rosuvastatin  20 mg Oral Daily   sertraline  50 mg Oral Daily   tamsulosin  0.8 mg Oral Daily   Continuous Infusions:  cefTRIAXone (ROCEPHIN)  IV 200 mL/hr at 09/21/22 1325   vancomycin     PRN Meds:.acetaminophen **OR** acetaminophen, guaiFENesin, levalbuterol, LORazepam, melatonin, menthol-cetylpyridinium, phenol, trimethobenzamide  Antimicrobials: Anti-infectives (From admission, onward)    Start     Dose/Rate Route Frequency Ordered Stop   09/21/22 2000  vancomycin (VANCOREADY)  IVPB 1250 mg/250 mL        1,250 mg 166.7 mL/hr over 90 Minutes Intravenous Every 12 hours 09/21/22 0928 09/23/22 2359   09/20/22 1215  cefTRIAXone (ROCEPHIN) 2 g in sodium chloride 0.9 % 100 mL IVPB        2 g 200 mL/hr over 30 Minutes Intravenous Every 24 hours 09/20/22 1115     09/18/22 0800  vancomycin (VANCOCIN) IVPB 1000 mg/200 mL premix  Status:  Discontinued        1,000 mg 200 mL/hr over 60 Minutes Intravenous Every 12 hours 09/17/22 2015 09/21/22 0928   09/17/22 2015  vancomycin (VANCOCIN) IVPB 1000 mg/200 mL premix  Status:  Discontinued        1,000 mg 200 mL/hr over 60 Minutes Intravenous  Once 09/17/22 2011 09/17/22 2014   09/17/22 2015  ceFEPIme (MAXIPIME) 2 g in sodium chloride 0.9 % 100 mL IVPB  Status:  Discontinued        2 g 200 mL/hr over 30 Minutes Intravenous  Once  09/17/22 2011 09/17/22 2014   09/17/22 2015  ceFEPIme (MAXIPIME) 2 g in sodium chloride 0.9 % 100 mL IVPB  Status:  Discontinued        2 g 200 mL/hr over 30 Minutes Intravenous Every 8 hours 09/17/22 2014 09/20/22 1115   09/17/22 2015  vancomycin (VANCOCIN) IVPB 1000 mg/200 mL premix       See Hyperspace for full Linked Orders Report.   1,000 mg 200 mL/hr over 60 Minutes Intravenous  Once 09/17/22 2014 09/18/22 0027   09/17/22 2015  vancomycin (VANCOCIN) IVPB 1000 mg/200 mL premix       See Hyperspace for full Linked Orders Report.   1,000 mg 200 mL/hr over 60 Minutes Intravenous  Once 09/17/22 2014 09/17/22 2308        I have personally reviewed the following labs and images: CBC: Recent Labs  Lab 09/17/22 1641 09/18/22 0750 09/19/22 0102 09/20/22 0041 09/21/22 0034  WBC 23.4* 20.3* 13.8* 14.0* 11.6*  NEUTROABS 20.6* 15.9*  --   --   --   HGB 15.0 12.3* 12.9* 13.2 13.1  HCT 45.3 37.0* 38.1* 38.4* 40.3  MCV 92.4 91.6 91.1 90.8 92.4  PLT 281 212 204 231 226   BMP &GFR Recent Labs  Lab 09/15/22 0230 09/17/22 1641 09/18/22 0750 09/19/22 0059 09/19/22 0102 09/19/22 1214 09/20/22 0041 09/21/22 0034  NA 135 139 137  --  135  --  134* 134*  K 3.5 4.1 3.5  --  3.7  --  3.8 3.5  CL 99 100 100  --  101  --  98 99  CO2 '25 29 29  '$ --  30  --  29 28  GLUCOSE 96 111* 102*  --  126*  --  114* 96  BUN '8 14 10  '$ --  <5*  --  <5* <5*  CREATININE 1.11 0.85 0.65  --  0.82  --  0.73 0.75  CALCIUM 8.7* 9.2 8.1*  --  8.1*  --  8.1* 8.3*  MG 1.7  --   --  1.8  --  2.0 1.8 1.9  PHOS 3.8  --   --   --   --   --  3.3 3.3   Estimated Creatinine Clearance: 103.7 mL/min (by C-G formula based on SCr of 0.75 mg/dL). Liver & Pancreas: Recent Labs  Lab 09/17/22 1641 09/20/22 0041 09/21/22 0034  AST 12*  --   --   ALT 21  --   --  ALKPHOS 96  --   --   BILITOT 0.8  --   --   PROT 7.1  --   --   ALBUMIN 4.3 2.9* 2.9*   No results for input(s): "LIPASE", "AMYLASE" in the last 168  hours. No results for input(s): "AMMONIA" in the last 168 hours. Diabetic: No results for input(s): "HGBA1C" in the last 72 hours. Recent Labs  Lab 09/18/22 1344  GLUCAP 115*   Cardiac Enzymes: No results for input(s): "CKTOTAL", "CKMB", "CKMBINDEX", "TROPONINI" in the last 168 hours. No results for input(s): "PROBNP" in the last 8760 hours. Coagulation Profile: Recent Labs  Lab 09/14/22 1832 09/17/22 1648  INR 1.9* 1.4*   Thyroid Function Tests: No results for input(s): "TSH", "T4TOTAL", "FREET4", "T3FREE", "THYROIDAB" in the last 72 hours. Lipid Profile: No results for input(s): "CHOL", "HDL", "LDLCALC", "TRIG", "CHOLHDL", "LDLDIRECT" in the last 72 hours. Anemia Panel: Recent Labs    09/20/22 0041 09/20/22 2159  VITAMINB12 583  --   FOLATE 18.6 17.6  FERRITIN 84  --   TIBC 332  --   IRON 26*  --   RETICCTPCT 1.3  --    Urine analysis:    Component Value Date/Time   COLORURINE YELLOW 05/09/2022 1158   APPEARANCEUR HAZY (A) 05/09/2022 1158   LABSPEC 1.016 05/09/2022 1158   PHURINE 5.0 05/09/2022 1158   GLUCOSEU NEGATIVE 05/09/2022 1158   GLUCOSEU NEGATIVE 03/31/2022 1030   HGBUR NEGATIVE 05/09/2022 1158   BILIRUBINUR NEGATIVE 05/09/2022 1158   BILIRUBINUR negative 05/22/2016 1323   BILIRUBINUR neg 03/22/2015 1017   KETONESUR 5 (A) 05/09/2022 1158   PROTEINUR NEGATIVE 05/09/2022 1158   UROBILINOGEN 0.2 03/31/2022 1030   NITRITE NEGATIVE 05/09/2022 1158   LEUKOCYTESUR NEGATIVE 05/09/2022 1158   Sepsis Labs: Invalid input(s): "PROCALCITONIN", "LACTICIDVEN"  Microbiology: Recent Results (from the past 240 hour(s))  Resp panel by RT-PCR (RSV, Flu A&B, Covid) Anterior Nasal Swab     Status: None   Collection Time: 09/11/22  4:32 PM   Specimen: Anterior Nasal Swab  Result Value Ref Range Status   SARS Coronavirus 2 by RT PCR NEGATIVE NEGATIVE Final    Comment: (NOTE) SARS-CoV-2 target nucleic acids are NOT DETECTED.  The SARS-CoV-2 RNA is generally  detectable in upper respiratory specimens during the acute phase of infection. The lowest concentration of SARS-CoV-2 viral copies this assay can detect is 138 copies/mL. A negative result does not preclude SARS-Cov-2 infection and should not be used as the sole basis for treatment or other patient management decisions. A negative result may occur with  improper specimen collection/handling, submission of specimen other than nasopharyngeal swab, presence of viral mutation(s) within the areas targeted by this assay, and inadequate number of viral copies(<138 copies/mL). A negative result must be combined with clinical observations, patient history, and epidemiological information. The expected result is Negative.  Fact Sheet for Patients:  EntrepreneurPulse.com.au  Fact Sheet for Healthcare Providers:  IncredibleEmployment.be  This test is no t yet approved or cleared by the [place] FDA and  has been authorized for detection and/or diagnosis of SARS-CoV-2 by FDA under an Emergency Use Authorization (EUA). This EUA will remain  in effect (meaning this test can be used) for the duration of the COVID-19 declaration under Section 564(b)(1) of the Act, 21 U.S.C.section 360bbb-3(b)(1), unless the authorization is terminated  or revoked sooner.       Influenza A by PCR NEGATIVE NEGATIVE Final   Influenza B by PCR NEGATIVE NEGATIVE Final    Comment: (  NOTE) The Xpert Xpress SARS-CoV-2/FLU/RSV plus assay is intended as an aid in the diagnosis of influenza from Nasopharyngeal swab specimens and should not be used as a sole basis for treatment. Nasal washings and aspirates are unacceptable for Xpert Xpress SARS-CoV-2/FLU/RSV testing.  Fact Sheet for Patients: EntrepreneurPulse.com.au  Fact Sheet for Healthcare Providers: IncredibleEmployment.be  This test is not yet approved or cleared by the [place] FDA  and has been authorized for detection and/or diagnosis of SARS-CoV-2 by FDA under an Emergency Use Authorization (EUA). This EUA will remain in effect (meaning this test can be used) for the duration of the COVID-19 declaration under Section 564(b)(1) of the Act, 21 U.S.C. section 360bbb-3(b)(1), unless the authorization is terminated or revoked.     Resp Syncytial Virus by PCR NEGATIVE NEGATIVE Final    Comment: (NOTE) Fact Sheet for Patients: EntrepreneurPulse.com.au  Fact Sheet for Healthcare Providers: IncredibleEmployment.be  This test is not yet approved or cleared by the [place] FDA and has been authorized for detection and/or diagnosis of SARS-CoV-2 by FDA under an Emergency Use Authorization (EUA). This EUA will remain in effect (meaning this test can be used) for the duration of the COVID-19 declaration under Section 564(b)(1) of the Act, 21 U.S.C. section 360bbb-3(b)(1), unless the authorization is terminated or revoked.  Performed at [facility], [place] [address], [place], [place] [address]   Respiratory (~20 pathogens) panel by PCR     Status: Abnormal   Collection Time: 09/12/22  8:30 AM   Specimen: Nasopharyngeal Swab; Respiratory  Result Value Ref Range Status   Adenovirus NOT DETECTED NOT DETECTED Final   Coronavirus 229E NOT DETECTED NOT DETECTED Final    Comment: (NOTE) The Coronavirus on the Respiratory Panel, DOES NOT test for the novel  Coronavirus (2019 nCoV)    Coronavirus HKU1 NOT DETECTED NOT DETECTED Final   Coronavirus NL63 NOT DETECTED NOT DETECTED Final   Coronavirus OC43 NOT DETECTED NOT DETECTED Final   Metapneumovirus NOT DETECTED NOT DETECTED Final   Rhinovirus / Enterovirus DETECTED (A) NOT DETECTED Final   Influenza A NOT DETECTED NOT DETECTED Final   Influenza B NOT DETECTED NOT DETECTED Final   Parainfluenza Virus 1 NOT DETECTED NOT DETECTED Final   Parainfluenza Virus 2 NOT DETECTED NOT  DETECTED Final   Parainfluenza Virus 3 NOT DETECTED NOT DETECTED Final   Parainfluenza Virus 4 NOT DETECTED NOT DETECTED Final   Respiratory Syncytial Virus NOT DETECTED NOT DETECTED Final   Bordetella pertussis NOT DETECTED NOT DETECTED Final   Bordetella Parapertussis NOT DETECTED NOT DETECTED Final   Chlamydophila pneumoniae NOT DETECTED NOT DETECTED Final   Mycoplasma pneumoniae NOT DETECTED NOT DETECTED Final    Comment: Performed at [facility], [address], [place], [place] [address]  Resp panel by RT-PCR (RSV, Flu A&B, Covid) Anterior Nasal Swab     Status: None   Collection Time: 09/17/22  4:41 PM   Specimen: Anterior Nasal Swab  Result Value Ref Range Status   SARS Coronavirus 2 by RT PCR NEGATIVE NEGATIVE Final    Comment: (NOTE) SARS-CoV-2 target nucleic acids are NOT DETECTED.  The SARS-CoV-2 RNA is generally detectable in upper respiratory specimens during the acute phase of infection. The lowest concentration of SARS-CoV-2 viral copies this assay can detect is 138 copies/mL. A negative result does not preclude SARS-Cov-2 infection and should not be used as the sole basis for treatment or other patient management decisions. A negative result may occur with  improper  specimen collection/handling, submission of specimen other than nasopharyngeal swab, presence of viral mutation(s) within the areas targeted by this assay, and inadequate number of viral copies(<138 copies/mL). A negative result must be combined with clinical observations, patient history, and epidemiological information. The expected result is Negative.  Fact Sheet for Patients:  EntrepreneurPulse.com.au  Fact Sheet for Healthcare Providers:  IncredibleEmployment.be  This test is no t yet approved or cleared by the [place] FDA and  has been authorized for detection and/or diagnosis of SARS-CoV-2 by FDA under an Emergency Use Authorization (EUA). This  EUA will remain  in effect (meaning this test can be used) for the duration of the COVID-19 declaration under Section 564(b)(1) of the Act, 21 U.S.C.section 360bbb-3(b)(1), unless the authorization is terminated  or revoked sooner.       Influenza A by PCR NEGATIVE NEGATIVE Final   Influenza B by PCR NEGATIVE NEGATIVE Final    Comment: (NOTE) The Xpert Xpress SARS-CoV-2/FLU/RSV plus assay is intended as an aid in the diagnosis of influenza from Nasopharyngeal swab specimens and should not be used as a sole basis for treatment. Nasal washings and aspirates are unacceptable for Xpert Xpress SARS-CoV-2/FLU/RSV testing.  Fact Sheet for Patients: EntrepreneurPulse.com.au  Fact Sheet for Healthcare Providers: IncredibleEmployment.be  This test is not yet approved or cleared by the [place] FDA and has been authorized for detection and/or diagnosis of SARS-CoV-2 by FDA under an Emergency Use Authorization (EUA). This EUA will remain in effect (meaning this test can be used) for the duration of the COVID-19 declaration under Section 564(b)(1) of the Act, 21 U.S.C. section 360bbb-3(b)(1), unless the authorization is terminated or revoked.     Resp Syncytial Virus by PCR NEGATIVE NEGATIVE Final    Comment: (NOTE) Fact Sheet for Patients: EntrepreneurPulse.com.au  Fact Sheet for Healthcare Providers: IncredibleEmployment.be  This test is not yet approved or cleared by the [place] FDA and has been authorized for detection and/or diagnosis of SARS-CoV-2 by FDA under an Emergency Use Authorization (EUA). This EUA will remain in effect (meaning this test can be used) for the duration of the COVID-19 declaration under Section 564(b)(1) of the Act, 21 U.S.C. section 360bbb-3(b)(1), unless the authorization is terminated or revoked.  Performed at [facility], [address] [place], [place] [address]   Blood Culture (routine x 2)     Status: None (Preliminary result)   Collection Time: 09/17/22  8:35 PM   Specimen: BLOOD  Result Value Ref Range Status   Specimen Description   Final    BLOOD RIGHT ANTECUBITAL Performed at [facility], [address], [place], [place] [address]    Special Requests   Final    BOTTLES DRAWN AEROBIC AND ANAEROBIC Blood Culture adequate volume Performed at [facility], [address] [place], [place] [address]    Culture   Final    NO GROWTH 4 DAYS Performed at [facility], [place] [address] [place], [place] [address]    Report Status PENDING  Incomplete  Blood Culture (routine x 2)     Status: None (Preliminary result)   Collection Time: 09/17/22  8:38 PM   Specimen: BLOOD  Result Value Ref Range Status   Specimen Description   Final    BLOOD LEFT ANTECUBITAL Performed at [facility], [address], [place], [place] [address]    Special Requests   Final    BOTTLES DRAWN AEROBIC AND ANAEROBIC Blood Culture adequate volume Performed at [facility], [address] [place], [place] [address]    Culture   Final    NO GROWTH 4 DAYS Performed at [facility], [place] [address], [place], [place] [address]    Report Status PENDING  Incomplete  MRSA Next Gen by PCR, Nasal     Status: Abnormal   Collection Time: 09/18/22  8:45 PM   Specimen: Nasal Mucosa; Nasal Swab  Result Value Ref Range Status   MRSA by PCR Next Gen DETECTED (A) NOT DETECTED Final    Comment: RESULT CALLED TO, READ BACK BY AND VERIFIED WITH: F JONES,RN'@0112'$  09/19/22 [place] (NOTE) The GeneXpert MRSA Assay (FDA approved for NASAL specimens only), is one component of a comprehensive MRSA colonization surveillance program. It is not intended to diagnose MRSA infection nor to guide or monitor treatment for MRSA infections. Test performance is not FDA approved in  patients less than 44 years old. Performed at [facility], [place] [address], [place], [place] [address]   Expectorated Sputum Assessment w Gram Stain, Rflx to Resp Cult     Status: None   Collection Time: 09/18/22  8:46 PM   Specimen: Sputum  Result Value Ref Range Status   Specimen Description SPUTUM  Final   Special Requests NONE  Final   Sputum evaluation   Final    THIS SPECIMEN IS ACCEPTABLE FOR SPUTUM CULTURE Performed at [facility], [place] [address], [place], [place] [address]    Report Status 09/19/2022 FINAL  Final  Culture, Respiratory w Gram Stain     Status: None   Collection Time: 09/18/22  8:46 PM   Specimen: SPU  Result Value Ref Range Status   Specimen Description SPUTUM  Final   Special Requests NONE Reflexed from [id number]  Final   Gram Stain   Final    FEW WBC PRESENT, PREDOMINANTLY PMN FEW GRAM POSITIVE COCCI IN PAIRS IN CLUSTERS    Culture   Final    RARE Normal respiratory flora-no Staph aureus or Pseudomonas seen Performed at [facility], [place] [address] [place] [address], [place], [place] [address]    Report Status 09/21/2022 FINAL  Final    Radiology Studies: No results found.     T. [place]  If 7PM-7AM, please contact night-coverage [email or web address] 09/21/2022, 2:38 PM

## 2022-09-21 NOTE — TOC Progression Note (Signed)
Transition of Care (TOC) - Progression Note    Patient Details  Name: Jesse HEIDINGER [name] MRN: [id number] Date of Birth: 11-07-1960  Transition of Care [facility]) CM/SW Contact  Zenon Mayo, RN Phone Number: 09/21/2022, 7:19 PM  Clinical Narrative:    From home with wife, HAP, Chronic afib with RVR, severe sepsis, acute resp failure, mood d/o.  Conts on IV abx.  TOC following.         Expected Discharge Plan and Services                                               Social Determinants of Health (SDOH) Interventions SDOH Screenings   Food Insecurity: No Food Insecurity (09/13/2022)  Housing: Low Risk  (09/13/2022)  Transportation Needs: No Transportation Needs (09/13/2022)  Utilities: Not At Risk (09/13/2022)  Depression (PHQ2-9): High Risk (05/15/2022)  Tobacco Use: High Risk (09/19/2022)    Readmission Risk Interventions     No data to display

## 2022-09-22 DIAGNOSIS — E890 Postprocedural hypothyroidism: Secondary | ICD-10-CM | POA: Diagnosis not present

## 2022-09-22 DIAGNOSIS — J189 Pneumonia, unspecified organism: Secondary | ICD-10-CM | POA: Diagnosis not present

## 2022-09-22 DIAGNOSIS — I4819 Other persistent atrial fibrillation: Secondary | ICD-10-CM | POA: Diagnosis not present

## 2022-09-22 DIAGNOSIS — J9601 Acute respiratory failure with hypoxia: Secondary | ICD-10-CM | POA: Diagnosis not present

## 2022-09-22 DIAGNOSIS — I10 Essential (primary) hypertension: Secondary | ICD-10-CM | POA: Diagnosis not present

## 2022-09-22 LAB — CBC
HCT: 38.6 % — ABNORMAL LOW (ref 39.0–52.0)
Hemoglobin: 13.2 g/dL (ref 13.0–17.0)
MCH: 30.9 pg (ref 26.0–34.0)
MCHC: 34.2 g/dL (ref 30.0–36.0)
MCV: 90.4 fL (ref 80.0–100.0)
Platelets: 246 10*3/uL (ref 150–400)
RBC: 4.27 MIL/uL (ref 4.22–5.81)
RDW: 12.4 % (ref 11.5–15.5)
WBC: 12.3 10*3/uL — ABNORMAL HIGH (ref 4.0–10.5)
nRBC: 0 % (ref 0.0–0.2)

## 2022-09-22 LAB — CULTURE, BLOOD (ROUTINE X 2)
Culture: NO GROWTH
Culture: NO GROWTH
Special Requests: ADEQUATE
Special Requests: ADEQUATE

## 2022-09-22 LAB — RENAL FUNCTION PANEL
Albumin: 2.9 g/dL — ABNORMAL LOW (ref 3.5–5.0)
Anion gap: 9 (ref 5–15)
BUN: <5 mg/dL — ABNORMAL LOW (ref 8–23)
CO2: 25 mmol/L (ref 22–32)
Calcium: 8.3 mg/dL — ABNORMAL LOW (ref 8.9–10.3)
Chloride: 100 mmol/L (ref 98–111)
Creatinine, Ser: 0.75 mg/dL (ref 0.61–1.24)
GFR, Estimated: >60 mL/min (ref >60)
Glucose, Bld: 89 mg/dL (ref 70–99)
Phosphorus: 3.1 mg/dL (ref 2.5–4.6)
Potassium: 3.6 mmol/L (ref 3.5–5.1)
Sodium: 134 mmol/L — ABNORMAL LOW (ref 135–145)

## 2022-09-22 LAB — PROCALCITONIN: Procalcitonin: <0.1 ng/mL

## 2022-09-22 LAB — MAGNESIUM: Magnesium: 1.9 mg/dL (ref 1.7–2.4)

## 2022-09-22 MED ORDER — POTASSIUM CHLORIDE CRYS ER 20 MEQ PO TBCR
40.0000 meq | EXTENDED_RELEASE_TABLET | Freq: Once | ORAL | Status: AC
  Administered 2022-09-22: 40 meq via ORAL
  Filled 2022-09-22: qty 2

## 2022-09-22 MED ORDER — METOPROLOL TARTRATE 50 MG PO TABS: 50.0000 mg | ORAL_TABLET | Freq: Two times a day (BID) | ORAL | 2 refills | Status: DC

## 2022-09-22 MED ORDER — CLONIDINE HCL 0.1 MG/24HR TD PTWK
0.1000 mg | MEDICATED_PATCH | TRANSDERMAL | Status: DC
  Administered 2022-09-22: 0.1 mg via TRANSDERMAL
  Filled 2022-09-22: qty 1

## 2022-09-22 MED ORDER — MAGNESIUM SULFATE IN D5W 1-5 GM/100ML-% IV SOLN
1.0000 g | Freq: Once | INTRAVENOUS | Status: AC
  Administered 2022-09-22: 1 g via INTRAVENOUS
  Filled 2022-09-22: qty 100

## 2022-09-22 MED ORDER — VALACYCLOVIR HCL 500 MG PO TABS
2000.0000 mg | ORAL_TABLET | Freq: Two times a day (BID) | ORAL | Status: AC
  Administered 2022-09-22 (×2): 2000 mg via ORAL
  Filled 2022-09-22 (×2): qty 4

## 2022-09-22 MED ORDER — AMIODARONE HCL 200 MG PO TABS: ORAL_TABLET | ORAL | 0 refills | Status: DC

## 2022-09-22 NOTE — Progress Notes (Signed)
PROGRESS NOTE  Jesse YEPIZ [name] LGX:[id number] DOB: 1960-10-02   PCP: Pcp, No  Patient is from: Home.  Lives with his wife  DOA: 09/17/2022 LOS: 4  Chief complaints Chief Complaint  Patient presents with   Hemoptysis     Brief Narrative / Interim history: 62 year old M with PMH of Graves' disease s/p RIA, HFmrEF, PAF on Xarelto, HTN, HLD, anxiety recent hospitalization from 12/29-1/2 for A-fib with RVR in the setting of rhinovirus infection when he underwent cardioversion returning with productive cough with blood on phlegm, DOE and subjective fever, and admitted for acute respiratory failure with hypoxia in the setting of multifocal pneumonia, A-fib with RVR and mild hemoptysis.  Desaturated to 84% on RA while in ED and required 3 L.  HR in 150s.  WBC 23.4 with left shift.  He was tachycardic and tachypneic as well.  CXR with RUL and medial left lung base infiltrate.  Cultures obtained.  Patient was started on vancomycin, cefepime and amiodarone drip.  Anticoagulation initially held due to hemoptysis.  The next day, formal cardiology consultation obtained.  Continued on IV amiodarone, vancomycin and cefepime.  Xarelto resumed.  Blood cultures NGTD.    Sputum culture with normal flora.  Hemoptysis resolved.  Transition to p.o. amiodarone for A-fib.  Antibiotic de-escalated to vancomycin and ceftriaxone.  Likely home on 1/5:10 days of appropriate antibiotics.  He is cleared for discharge by cardiology on loading dose amiodarone.    Subjective: Seen and examined earlier this morning.  No major events overnight of this morning.  Still coughing with some phlegm but no hemoptysis.  Denies shortness of breath, chest pain or palpitation.  Noted some blisters and redness on lower lip.  Objective: Vitals:   09/21/22 2007 09/22/22 0405 09/22/22 1022 09/22/22 1106  BP: (!) 136/92 (!) 132/98 (!) 158/109 (!) 143/103  Pulse: 99 74  99  Resp: '19 18  20  '$ Temp: 98.2 F (36.8 C) 98.1 F (36.7 C)   98.1 F (36.7 C)  TempSrc: Oral Oral  Oral  SpO2: 96% 95% 96% 92%  Weight:  86.7 kg      Examination:  GENERAL: No apparent distress.  Nontoxic. HEENT: MMM.  Vision and hearing grossly intact.  NECK: Supple.  No apparent JVD.  RESP:  No IWOB.  Fair aeration bilaterally. CVS: Irregular rhythm. Heart sounds normal.  ABD/GI/GU: BS+. Abd soft, NTND.  MSK/EXT:  Moves extremities. No apparent deformity. No edema.  SKIN:  erythema with fine blisters on his mid lower lip about 1 cm wide NEURO: Awake and alert. Oriented appropriately.  No apparent focal neuro deficit. PSYCH: Appears anxious.  Procedures:  None  Microbiology summarized: CXKGY-18, influenza and RSV PCR nonreactive Blood cultures NGTD MRSA PCR screen positive. Respiratory culture negative  Assessment and plan: Principal Problem:   HAP (hospital-acquired pneumonia) Active Problems:   Hypothyroidism following radioiodine therapy   Essential hypertension   Dyslipidemia   Chronic atrial fibrillation with RVR (HCC)   Severe sepsis (HCC)   Acute hypoxemic respiratory failure (HCC)   Iron deficiency anemia   Cough with hemoptysis   Tobacco use disorder   Mood disorder (HCC)   Prolonged QT interval   Nausea without vomiting   Sore throat  Severe sepsis with acute hypoxemic respiratory failure due to HAP: POA.  Has leukocytosis, tachycardia, tachypnea with hypoxic respiratory failure on presentation.  He was 84% on RA requiring 3 L in ED. CXR with RUL and medial left lung base infiltrate.  COVID-19, influenza and  RSV PCR nonreactive.  Blood cultures NGTD.  MRSA PCR screen positive.  Sepsis physiology improving.  Sputum culture with normal flora.  Respiratory failure and hemoptysis resolved.  Symptoms improved. -Continue vancomycin and ceftriaxone until 1/10. -Mucolytic's, antitussive, incentive spirometry  Paroxysmal A-fib with RVR: Recently hospitalized for this and had cardioversion.  HR elevated to 150 in ED. RVR  likely provoked by pneumonia.  HR in 90s to 100s..  Off IV amiodarone. -On p.o. amiodarone 400 mg twice daily for 1 week, then start p.o. amiodarone 200 mg daily on 1/15 -Continue home Xarelto. -Optimize K and Mg.   -Cardiology signed off.  Iron deficiency anemia: H&H stable after initial drop.  Likely hemodilution.  Iron saturation 8%.  Recent Labs    09/12/22 0142 09/13/22 0118 09/14/22 0135 09/15/22 0230 09/17/22 1641 09/18/22 0750 09/19/22 0102 09/20/22 0041 09/21/22 0034 09/22/22 0114  HGB 14.2 14.1 14.6 13.8 15.0 12.3* 12.9* 13.2 13.1 13.2  -Received IV ferric gluconate 250 mg twice daily for 3 days.  Hemoptysis: No frank red blood other than some blood staining on phlegm.  Resolved.  H&H stable. -Continue with Xarelto.  Mild hypocalcemia: Corrects to normal for hypoalbuminemia.   Hypothyroidism: TSH was low (0.26) on labs done 08/31/2022 and dose of Synthroid was adjusted during recent hospitalization per recommendation from endocrinology (Synthroid 200 mg daily except 100 mg on Sundays). -Continue Synthroid -Outpatient endocrinology follow-up   Essential hypertension: BP elevated. -On losartan instead of home lisinopril -Resume home clonidine patch. -Continue metoprolol   Hyperlipidemia -Continue Crestor   HFmrEF: Appears euvolemic.  TTE with LVEF of 40 to 45% in 08/2022.  BNP elevated to 460 which is slightly higher than prior.  Patient is not on diuretics at home. -Monitor fluid and respiratory status. -Cardiac meds as above   Mood disorder with anxiety -Continue Zoloft and Ativan as needed  Prolonged QT: QTc 514 -Avoid QT prolonging drugs  Nausea/sore throat: Seems to have resolved. -PPI, lozenges and Chloraseptic spray   BPH -Continue Flomax  Tobacco use disorder: Reports smoking about 4 to 5 cigarettes a day. -Encouraged smoking cessation.  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 -Valtrex 2 g every 12 hours x 2 doses   Body mass index  is 28.24 kg/m.          DVT prophylaxis:  Place and maintain sequential compression device Start: 09/19/22 1131 rivaroxaban (XARELTO) tablet 20 mg  Code Status: DNR/DNI Family Communication: None at bedside Level of care: Telemetry Cardiac Status is: Inpatient Remains inpatient appropriate because: Severe sepsis,  HAP, A-fib with RVR  Final disposition: Likely home on 1/10. Consultants:  Cardiology-signed off  Sch Meds:  Scheduled Meds:  [START ON 09/28/2022] amiodarone  200 mg Oral Daily   amiodarone  400 mg Oral BID   Chlorhexidine Gluconate Cloth  6 each Topical Q0600   [START ON 09/27/2022] levothyroxine  100 mcg Oral Q Sun   levothyroxine  200 mcg Oral Once per day on Mon Tue Wed Thu Fri Sat   losartan  50 mg Oral Daily   metoprolol tartrate  50 mg Oral BID   montelukast  10 mg Oral QHS   mupirocin ointment  1 Application Nasal BID   ofloxacin  1 drop Right Eye QID   pantoprazole  40 mg Oral Daily   prednisoLONE acetate  1 drop Right Eye QID   rivaroxaban  20 mg Oral Q supper   rosuvastatin  20 mg Oral Daily   sertraline  50 mg Oral  Daily   tamsulosin  0.8 mg Oral Daily   valACYclovir  2,000 mg Oral BID   Continuous Infusions:  cefTRIAXone (ROCEPHIN)  IV 2 g (09/22/22 1232)   vancomycin 1,250 mg (09/22/22 1023)   PRN Meds:.acetaminophen **OR** acetaminophen, guaiFENesin, levalbuterol, LORazepam, melatonin, menthol-cetylpyridinium, phenol, trimethobenzamide  Antimicrobials: Anti-infectives (From admission, onward)    Start     Dose/Rate Route Frequency Ordered Stop   09/22/22 1030  valACYclovir (VALTREX) tablet 2,000 mg        2,000 mg Oral 2 times daily 09/22/22 0932 09/23/22 0959   09/21/22 2000  vancomycin (VANCOREADY) IVPB 1250 mg/250 mL        1,250 mg 166.7 mL/hr over 90 Minutes Intravenous Every 12 hours 09/21/22 0928 09/23/22 2359   09/20/22 1215  cefTRIAXone (ROCEPHIN) 2 g in sodium chloride 0.9 % 100 mL IVPB        2 g 200 mL/hr over 30 Minutes  Intravenous Every 24 hours 09/20/22 1115     09/18/22 0800  vancomycin (VANCOCIN) IVPB 1000 mg/200 mL premix  Status:  Discontinued        1,000 mg 200 mL/hr over 60 Minutes Intravenous Every 12 hours 09/17/22 2015 09/21/22 0928   09/17/22 2015  vancomycin (VANCOCIN) IVPB 1000 mg/200 mL premix  Status:  Discontinued        1,000 mg 200 mL/hr over 60 Minutes Intravenous  Once 09/17/22 2011 09/17/22 2014   09/17/22 2015  ceFEPIme (MAXIPIME) 2 g in sodium chloride 0.9 % 100 mL IVPB  Status:  Discontinued        2 g 200 mL/hr over 30 Minutes Intravenous  Once 09/17/22 2011 09/17/22 2014   09/17/22 2015  ceFEPIme (MAXIPIME) 2 g in sodium chloride 0.9 % 100 mL IVPB  Status:  Discontinued        2 g 200 mL/hr over 30 Minutes Intravenous Every 8 hours 09/17/22 2014 09/20/22 1115   09/17/22 2015  vancomycin (VANCOCIN) IVPB 1000 mg/200 mL premix       See Hyperspace for full Linked Orders Report.   1,000 mg 200 mL/hr over 60 Minutes Intravenous  Once 09/17/22 2014 09/18/22 0027   09/17/22 2015  vancomycin (VANCOCIN) IVPB 1000 mg/200 mL premix       See Hyperspace for full Linked Orders Report.   1,000 mg 200 mL/hr over 60 Minutes Intravenous  Once 09/17/22 2014 09/17/22 2308        I have personally reviewed the following labs and images: CBC: Recent Labs  Lab 09/17/22 1641 09/18/22 0750 09/19/22 0102 09/20/22 0041 09/21/22 0034 09/22/22 0114  WBC 23.4* 20.3* 13.8* 14.0* 11.6* 12.3*  NEUTROABS 20.6* 15.9*  --   --   --   --   HGB 15.0 12.3* 12.9* 13.2 13.1 13.2  HCT 45.3 37.0* 38.1* 38.4* 40.3 38.6*  MCV 92.4 91.6 91.1 90.8 92.4 90.4  PLT 281 212 204 231 226 246   BMP &GFR Recent Labs  Lab 09/18/22 0750 09/19/22 0059 09/19/22 0102 09/19/22 1214 09/20/22 0041 09/21/22 0034 09/22/22 0114  NA 137  --  135  --  134* 134* 134*  K 3.5  --  3.7  --  3.8 3.5 3.6  CL 100  --  101  --  98 99 100  CO2 29  --  30  --  '29 28 25  '$ GLUCOSE 102*  --  126*  --  114* 96 89  BUN 10  --   <5*  --  <5* <5* <  5*  CREATININE 0.65  --  0.82  --  0.73 0.75 0.75  CALCIUM 8.1*  --  8.1*  --  8.1* 8.3* 8.3*  MG  --  1.8  --  2.0 1.8 1.9 1.9  PHOS  --   --   --   --  3.3 3.3 3.1   Estimated Creatinine Clearance: 103.1 mL/min (by C-G formula based on SCr of 0.75 mg/dL). Liver & Pancreas: Recent Labs  Lab 09/17/22 1641 09/20/22 0041 09/21/22 0034 09/22/22 0114  AST 12*  --   --   --   ALT 21  --   --   --   ALKPHOS 96  --   --   --   BILITOT 0.8  --   --   --   PROT 7.1  --   --   --   ALBUMIN 4.3 2.9* 2.9* 2.9*   No results for input(s): "LIPASE", "AMYLASE" in the last 168 hours. No results for input(s): "AMMONIA" in the last 168 hours. Diabetic: No results for input(s): "HGBA1C" in the last 72 hours. Recent Labs  Lab 09/18/22 1344  GLUCAP 115*   Cardiac Enzymes: No results for input(s): "CKTOTAL", "CKMB", "CKMBINDEX", "TROPONINI" in the last 168 hours. No results for input(s): "PROBNP" in the last 8760 hours. Coagulation Profile: Recent Labs  Lab 09/17/22 1648  INR 1.4*   Thyroid Function Tests: No results for input(s): "TSH", "T4TOTAL", "FREET4", "T3FREE", "THYROIDAB" in the last 72 hours. Lipid Profile: No results for input(s): "CHOL", "HDL", "LDLCALC", "TRIG", "CHOLHDL", "LDLDIRECT" in the last 72 hours. Anemia Panel: Recent Labs    09/20/22 0041 09/20/22 2159  VITAMINB12 583  --   FOLATE 18.6 17.6  FERRITIN 84  --   TIBC 332  --   IRON 26*  --   RETICCTPCT 1.3  --    Urine analysis:    Component Value Date/Time   COLORURINE YELLOW 05/09/2022 1158   APPEARANCEUR HAZY (A) 05/09/2022 1158   LABSPEC 1.016 05/09/2022 1158   PHURINE 5.0 05/09/2022 1158   GLUCOSEU NEGATIVE 05/09/2022 1158   GLUCOSEU NEGATIVE 03/31/2022 1030   HGBUR NEGATIVE 05/09/2022 1158   BILIRUBINUR NEGATIVE 05/09/2022 1158   BILIRUBINUR negative 05/22/2016 1323   BILIRUBINUR neg 03/22/2015 1017   KETONESUR 5 (A) 05/09/2022 1158   PROTEINUR NEGATIVE 05/09/2022 1158    UROBILINOGEN 0.2 03/31/2022 1030   NITRITE NEGATIVE 05/09/2022 1158   LEUKOCYTESUR NEGATIVE 05/09/2022 1158   Sepsis Labs: Invalid input(s): "PROCALCITONIN", "LACTICIDVEN"  Microbiology: Recent Results (from the past 240 hour(s))  Resp panel by RT-PCR (RSV, Flu A&B, Covid) Anterior Nasal Swab     Status: None   Collection Time: 09/17/22  4:41 PM   Specimen: Anterior Nasal Swab  Result Value Ref Range Status   SARS Coronavirus 2 by RT PCR NEGATIVE NEGATIVE Final    Comment: (NOTE) SARS-CoV-2 target nucleic acids are NOT DETECTED.  The SARS-CoV-2 RNA is generally detectable in upper respiratory specimens during the acute phase of infection. The lowest concentration of SARS-CoV-2 viral copies this assay can detect is 138 copies/mL. A negative result does not preclude SARS-Cov-2 infection and should not be used as the sole basis for treatment or other patient management decisions. A negative result may occur with  improper specimen collection/handling, submission of specimen other than nasopharyngeal swab, presence of viral mutation(s) within the areas targeted by this assay, and inadequate number of viral copies(<138 copies/mL). A negative result must be combined with clinical observations, patient history, and epidemiological  information. The expected result is Negative.  Fact Sheet for Patients:  EntrepreneurPulse.com.au  Fact Sheet for Healthcare Providers:  IncredibleEmployment.be  This test is no t yet approved or cleared by the [place] FDA and  has been authorized for detection and/or diagnosis of SARS-CoV-2 by FDA under an Emergency Use Authorization (EUA). This EUA will remain  in effect (meaning this test can be used) for the duration of the COVID-19 declaration under Section 564(b)(1) of the Act, 21 U.S.C.section 360bbb-3(b)(1), unless the authorization is terminated  or revoked sooner.       Influenza A by PCR NEGATIVE  NEGATIVE Final   Influenza B by PCR NEGATIVE NEGATIVE Final    Comment: (NOTE) The Xpert Xpress SARS-CoV-2/FLU/RSV plus assay is intended as an aid in the diagnosis of influenza from Nasopharyngeal swab specimens and should not be used as a sole basis for treatment. Nasal washings and aspirates are unacceptable for Xpert Xpress SARS-CoV-2/FLU/RSV testing.  Fact Sheet for Patients: EntrepreneurPulse.com.au  Fact Sheet for Healthcare Providers: IncredibleEmployment.be  This test is not yet approved or cleared by the [place] FDA and has been authorized for detection and/or diagnosis of SARS-CoV-2 by FDA under an Emergency Use Authorization (EUA). This EUA will remain in effect (meaning this test can be used) for the duration of the COVID-19 declaration under Section 564(b)(1) of the Act, 21 U.S.C. section 360bbb-3(b)(1), unless the authorization is terminated or revoked.     Resp Syncytial Virus by PCR NEGATIVE NEGATIVE Final    Comment: (NOTE) Fact Sheet for Patients: EntrepreneurPulse.com.au  Fact Sheet for Healthcare Providers: IncredibleEmployment.be  This test is not yet approved or cleared by the [place] FDA and has been authorized for detection and/or diagnosis of SARS-CoV-2 by FDA under an Emergency Use Authorization (EUA). This EUA will remain in effect (meaning this test can be used) for the duration of the COVID-19 declaration under Section 564(b)(1) of the Act, 21 U.S.C. section 360bbb-3(b)(1), unless the authorization is terminated or revoked.  Performed at [facility], [address] [place], [place] [address]   Blood Culture (routine x 2)     Status: None   Collection Time: 09/17/22  8:35 PM   Specimen: BLOOD  Result Value Ref Range Status   Specimen Description   Final    BLOOD RIGHT ANTECUBITAL Performed at [facility], [address], [place], [place] [address]    Special Requests   Final    BOTTLES DRAWN AEROBIC AND ANAEROBIC Blood Culture adequate volume Performed at [facility], [address], [place], [place] [address]    Culture   Final    NO GROWTH 5 DAYS Performed at [facility], [place] [address], [place], [place] [address]    Report Status 09/22/2022 FINAL  Final  Blood Culture (routine x 2)     Status: None   Collection Time: 09/17/22  8:38 PM   Specimen: BLOOD  Result Value Ref Range Status   Specimen Description   Final    BLOOD LEFT ANTECUBITAL Performed at [facility], [address] [place], [place] [address]    Special Requests   Final    BOTTLES DRAWN AEROBIC AND ANAEROBIC Blood Culture adequate volume Performed at [facility], [address] [place], [place] [address]    Culture   Final    NO GROWTH 5 DAYS Performed at [facility], [place] [address] [place],  [address]    Report Status 09/22/2022 FINAL  Final  MRSA Next Gen by PCR, Nasal     Status: Abnormal   Collection Time: 09/18/22  8:45 PM   Specimen: Nasal Mucosa; Nasal Swab  Result Value Ref Range Status   MRSA by PCR Next Gen DETECTED (A) NOT DETECTED Final    Comment: RESULT CALLED TO, READ BACK BY AND VERIFIED WITH: F JONES,RN'@0112'$  09/19/22 [place] (NOTE) The GeneXpert MRSA Assay (FDA approved for NASAL specimens only), is one component of a comprehensive MRSA colonization surveillance program. It is not intended to diagnose MRSA infection nor to guide or monitor treatment for MRSA infections. Test performance is not FDA approved in patients less than 52 years old. Performed at [facility], [place] [address], [place], [place] [address]   Expectorated Sputum Assessment w Gram Stain, Rflx to Resp Cult     Status: None   Collection Time: 09/18/22  8:46 PM   Specimen: Sputum  Result Value Ref Range Status   Specimen Description  SPUTUM  Final   Special Requests NONE  Final   Sputum evaluation   Final    THIS SPECIMEN IS ACCEPTABLE FOR SPUTUM CULTURE Performed at [facility], [place] [address] [place], [place] [address]    Report Status 09/19/2022 FINAL  Final  Culture, Respiratory w Gram Stain     Status: None   Collection Time: 09/18/22  8:46 PM   Specimen: SPU  Result Value Ref Range Status   Specimen Description SPUTUM  Final   Special Requests NONE Reflexed from [id number]  Final   Gram Stain   Final    FEW WBC PRESENT, PREDOMINANTLY PMN FEW GRAM POSITIVE COCCI IN PAIRS IN CLUSTERS    Culture   Final    RARE Normal respiratory flora-no Staph aureus or Pseudomonas seen Performed at [facility], [place] [address], [place],  [address]    Report Status 09/21/2022 FINAL  Final    Radiology Studies: No results found.     T. [place]  If 7PM-7AM, please contact night-coverage [email or web address] 09/22/2022, 12:35 PM

## 2022-09-22 NOTE — Progress Notes (Signed)
   09/22/22 1000  Mobility  Activity Ambulated with assistance in hallway  Level of Assistance Contact guard assist, steadying assist  Assistive Device None  Distance Ambulated (ft) 400 ft  Activity Response Tolerated well  Mobility Referral Yes  $Mobility charge 1 Mobility   Mobility Specialist Progress Note  Received pt in bed having no complaints and agreeable to mobility. Pt was asymptomatic throughout ambulation and returned to room w/o fault. Left in bed w/ call bell in reach and all needs met.   Lucious Groves Mobility Specialist  Please contact via SecureChat or Rehab office at [phone]

## 2022-09-22 NOTE — Progress Notes (Signed)
Rounding Note    Patient Name: Jesse GOLEBIEWSKI [name] Date of Encounter: 09/22/2022  [facility] Cardiologist: Cristopher Peru, MD   Subjective   Pt denies CP or dyspnea  Inpatient Medications    Scheduled Meds:  [START ON 09/28/2022] amiodarone  200 mg Oral Daily   amiodarone  400 mg Oral BID   Chlorhexidine Gluconate Cloth  6 each Topical Q0600   [START ON 09/27/2022] levothyroxine  100 mcg Oral Q Sun   levothyroxine  200 mcg Oral Once per day on Mon Tue Wed Thu Fri Sat   losartan  50 mg Oral Daily   metoprolol tartrate  50 mg Oral BID   montelukast  10 mg Oral QHS   mupirocin ointment  1 Application Nasal BID   ofloxacin  1 drop Right Eye QID   pantoprazole  40 mg Oral Daily   potassium chloride  40 mEq Oral Once   prednisoLONE acetate  1 drop Right Eye QID   rivaroxaban  20 mg Oral Q supper   rosuvastatin  20 mg Oral Daily   sertraline  50 mg Oral Daily   tamsulosin  0.8 mg Oral Daily   valACYclovir  2,000 mg Oral BID   Continuous Infusions:  cefTRIAXone (ROCEPHIN)  IV 200 mL/hr at 09/21/22 1325   magnesium sulfate bolus IVPB     vancomycin Stopped (09/21/22 2348)   PRN Meds: acetaminophen **OR** acetaminophen, guaiFENesin, levalbuterol, LORazepam, melatonin, menthol-cetylpyridinium, phenol, trimethobenzamide   Vital Signs    Vitals:   09/21/22 1442 09/21/22 1719 09/21/22 2007 09/22/22 0405  BP: (!) 150/93 (!) 160/93 (!) 136/92 (!) 132/98  Pulse:   99 74  Resp:  '20 19 18  '$ Temp:  98.5 F (36.9 C) 98.2 F (36.8 C) 98.1 F (36.7 C)  TempSrc:  Oral Oral Oral  SpO2:  96% 96% 95%  Weight:    86.7 kg    Intake/Output Summary (Last 24 hours) at 09/22/2022 1012 Last data filed at 09/22/2022 2202 Gross per 24 hour  Intake 1619.5 ml  Output 2100 ml  Net -480.5 ml       09/22/2022    4:05 AM 09/21/2022    1:00 AM 09/20/2022   12:10 AM  Last 3 Weights  Weight (lbs) 191 lb 3.2 oz 194 lb 0.1 oz 196 lb 6.9 oz  Weight (kg) 86.728 kg 88 kg 89.1 kg       Telemetry    Atrial fibrillation rate controlled - Personally Reviewed   Physical Exam   GEN: NAD  Neck: Supple Cardiac: irregular, no rub Respiratory: CTA GI: Soft, NT/ND MS: No edema Neuro:  Grossly intact Psych: Normal affect   Labs    High Sensitivity Troponin:   Recent Labs  Lab 09/11/22 1335 09/11/22 1529  TROPONINIHS 5 5      Chemistry Recent Labs  Lab 09/17/22 1641 09/18/22 0750 09/20/22 0041 09/21/22 0034 09/22/22 0114  NA 139   < > 134* 134* 134*  K 4.1   < > 3.8 3.5 3.6  CL 100   < > 98 99 100  CO2 29   < > '29 28 25  '$ GLUCOSE 111*   < > 114* 96 89  BUN 14   < > <5* <5* <5*  CREATININE 0.85   < > 0.73 0.75 0.75  CALCIUM 9.2   < > 8.1* 8.3* 8.3*  MG  --    < > 1.8 1.9 1.9  PROT 7.1  --   --   --   --  ALBUMIN 4.3  --  2.9* 2.9* 2.9*  AST 12*  --   --   --   --   ALT 21  --   --   --   --   ALKPHOS 96  --   --   --   --   BILITOT 0.8  --   --   --   --   GFRNONAA >60   < > >60 >60 >60  ANIONGAP 10   < > '7 7 9   '$ < > = values in this interval not displayed.      Hematology Recent Labs  Lab 09/20/22 0041 09/21/22 0034 09/22/22 0114  WBC 14.0* 11.6* 12.3*  RBC 4.23  4.36 4.36 4.27  HGB 13.2 13.1 13.2  HCT 38.4* 40.3 38.6*  MCV 90.8 92.4 90.4  MCH 31.2 30.0 30.9  MCHC 34.4 32.5 34.2  RDW 12.4 12.3 12.4  PLT 231 226 246    BNP Recent Labs  Lab 09/19/22 0102 09/20/22 0041  BNP 458.8* 411.4*      Patient Profile     62 y.o. male with past medical history of paroxysmal atrial fibrillation, Graves' disease, hypertension admitted with pneumonia for evaluation of atrial fibrillation.  Echocardiogram December 2023 showed ejection fraction 40 to 45%, mild left ventricular hypertrophy, mild right ventricular enlargement, moderate biatrial enlargement, mild mitral regurgitation.  Assessment & Plan    1 persistent atrial fibrillation-patient remains in atrial fibrillation.  He underwent successful cardioversion 1 week ago but atrial  fibrillation recurred in the setting of pneumonia.  He was placed on amiodarone and has now been converted to oral (continue 400 mg twice daily to complete 1 week then 200 mg daily thereafter); continue metoprolol; continue xarelto.  Patient can be discharged from a cardiac standpoint.  Will plan cardioversion in 4 to 6 weeks once completely recovered from pneumonia.  He needs follow-up with Dr. Lovena Le.  Question consideration of ablation to avoid long-term amiodarone.  2 cardiomyopathy-LV function mildly reduced.  Would plan to repeat echocardiogram once patient has been in sinus for 3 months.  If LV function remains decreased would consider cardiac CTA.  3 pneumonia-Per primary service.  4 hypertension-blood pressure is elevated.  Losartan added yesterday.  Can be advanced as an outpatient if needed.  5 hyperlipidemia-continue Crestor.  Cardiology will sign off.  He can be discharged on present medications.  Will arrange follow-up with APP 2 to 4 weeks after discharge and Dr. Lovena Le in 3 months.  Please call with questions.  For questions or updates, please contact [place] Please consult [email or web address] for contact info under        Signed, Kirk Ruths, MD  09/22/2022, 10:12 AM

## 2022-09-23 DIAGNOSIS — J189 Pneumonia, unspecified organism: Secondary | ICD-10-CM | POA: Diagnosis not present

## 2022-09-23 DIAGNOSIS — J9601 Acute respiratory failure with hypoxia: Secondary | ICD-10-CM | POA: Diagnosis not present

## 2022-09-23 DIAGNOSIS — E890 Postprocedural hypothyroidism: Secondary | ICD-10-CM | POA: Diagnosis not present

## 2022-09-23 DIAGNOSIS — I10 Essential (primary) hypertension: Secondary | ICD-10-CM | POA: Diagnosis not present

## 2022-09-23 LAB — RENAL FUNCTION PANEL
Albumin: 3 g/dL — ABNORMAL LOW (ref 3.5–5.0)
Anion gap: 7 (ref 5–15)
BUN: <5 mg/dL — ABNORMAL LOW (ref 8–23)
CO2: 26 mmol/L (ref 22–32)
Calcium: 8.4 mg/dL — ABNORMAL LOW (ref 8.9–10.3)
Chloride: 101 mmol/L (ref 98–111)
Creatinine, Ser: 0.78 mg/dL (ref 0.61–1.24)
GFR, Estimated: >60 mL/min (ref >60)
Glucose, Bld: 109 mg/dL — ABNORMAL HIGH (ref 70–99)
Phosphorus: 3.4 mg/dL (ref 2.5–4.6)
Potassium: 4.3 mmol/L (ref 3.5–5.1)
Sodium: 134 mmol/L — ABNORMAL LOW (ref 135–145)

## 2022-09-23 LAB — MAGNESIUM: Magnesium: 2 mg/dL (ref 1.7–2.4)

## 2022-09-23 MED ORDER — TRIMETHOBENZAMIDE HCL 100 MG/ML IM SOLN
200.0000 mg | Freq: Three times a day (TID) | INTRAMUSCULAR | Status: DC | PRN
  Filled 2022-09-23: qty 2

## 2022-09-23 MED ORDER — LOSARTAN POTASSIUM 50 MG PO TABS: 50.0000 mg | ORAL_TABLET | Freq: Every day | ORAL | 1 refills | Status: DC

## 2022-09-23 NOTE — Progress Notes (Signed)
   09/23/22 1000  Mobility  Activity Ambulated with assistance in hallway  Level of Assistance Contact guard assist, steadying assist  Assistive Device None  Distance Ambulated (ft) 400 ft  Activity Response Tolerated well  Mobility Referral Yes  $Mobility charge 1 Mobility   Mobility Specialist Progress Note  Received pt in bed having no complaints and agreeable to mobility. Pt was asymptomatic throughout ambulation and returned to room w/o fault. Left in bed w/ call bell in reach and all needs met.   Lucious Groves Mobility Specialist  Please contact via SecureChat or Rehab office at [phone]

## 2022-09-23 NOTE — TOC Transition Note (Signed)
Transition of Care [facility]) - CM/SW Discharge Note   Patient Details  Name: Jesse BASTYR [name] MRN: [id number] Date of Birth: [birth year]  Transition [facility] Care [facility]) CM/SW Contact:  Zenon Mayo, RN Phone Number: 09/23/2022, 11:00 AM   Clinical Narrative:    From home with wife, he is indep, he has no DME . He states his son , Jesse Suarez, will be transporting him home today. He has a follow up apt at the [facility] on [address] for hospital follow up.   Final next level of care: Home/Self Care Barriers to Discharge: No Barriers Identified   Patient Goals and CMS Choice   Choice offered to / list presented to : NA  Discharge Placement                         Discharge Plan and Services Additional resources added to the After Visit Summary for   In-house Referral: NA Discharge Planning Services: CM Consult Post Acute Care Choice: NA          DME Arranged: N/A         HH Arranged: NA          Social Determinants of Health (SDOH) Interventions SDOH Screenings   Food Insecurity: No Food Insecurity (09/13/2022)  Housing: Low Risk  (09/13/2022)  Transportation Needs: No Transportation Needs (09/13/2022)  Utilities: Not At Risk (09/13/2022)  Depression (PHQ2-9): High Risk (05/15/2022)  Tobacco Use: High Risk (09/19/2022)     Readmission Risk Interventions    09/23/2022   10:46 AM  Readmission Risk Prevention Plan  Transportation Screening Complete  PCP or Specialist Appt within 3-5 Days Complete  HRI or [place] Complete  Palliative Care Screening Not Applicable  Medication Review (RN Care Manager) Complete

## 2022-09-23 NOTE — Discharge Summary (Signed)
Physician Discharge Summary  Jesse FIGIEL [name] OHY:[id number] DOB: March 30, 1961 DOA: 09/17/2022  PCP: Merryl Hacker, No  Admit date: 09/17/2022 Discharge date: 09/23/2022 Admitted From: Home Disposition: Home Recommendations for Outpatient Follow-up:  Follow up with PCP, cardiology and electrophysiology as below Check CMP and CBC in 1 week Please follow up on the following pending results: None  Home Health: Not indicated Equipment/Devices: Not indicated  Discharge Condition: Stable CODE STATUS: DNR/DNI  Follow-up Information     Evans Lance, MD Follow up on 10/29/2022.   Specialty: Cardiology Why: 10:30AM. Cardiology follow up Contact information: [address] [place] [address] [phone]         [place] Follow up on 09/29/2022.   Specialty: Family Medicine Why: '@2'$ :30pm Contact information: [place] [address] [phone]        Shirley Friar, PA-C Follow up on 10/07/2022.   Specialty: Cardiology Why: 9:40AM. Electrophysiology post hospital follow up Contact information: [place] [address] [phone]                 Hospital course 62 year old M with PMH of Graves' disease s/p RIA, HFmrEF, PAF on Xarelto, HTN, HLD, anxiety recent hospitalization from 12/29-1/2 for A-fib with RVR in the setting of rhinovirus infection when he underwent cardioversion returning with productive cough with blood on phlegm, DOE and subjective fever, and admitted for acute respiratory failure with hypoxia in the setting of multifocal pneumonia, A-fib with RVR and mild hemoptysis.  Desaturated to 84% on RA while in ED and required 3 L.  HR in 150s.  WBC 23.4 with left shift.  He was tachycardic and tachypneic as well.  CXR with RUL and medial left lung base infiltrate.  Cultures obtained.  Patient was started on vancomycin, cefepime and amiodarone drip.   Anticoagulation initially held due to hemoptysis.   The next day, formal cardiology consultation obtained.  Continued on IV amiodarone, vancomycin and cefepime.  Xarelto resumed.  Blood cultures NGTD.     Sputum culture with normal flora.  Hemoptysis resolved.  Transition to p.o. amiodarone for A-fib.  Metoprolol reduced to 50 mg twice daily.  Antibiotic de-escalated to vancomycin and ceftriaxone, and he completed a total of 5 days course.  Respiratory symptoms resolved except for subtle residual cough.   See individual problem list below for more.   Problems addressed during this hospitalization Principal Problem:   HAP (hospital-acquired pneumonia) Active Problems:   Hypothyroidism following radioiodine therapy   Essential hypertension   Dyslipidemia   Chronic atrial fibrillation with RVR (HCC)   Severe sepsis (HCC)   Acute hypoxemic respiratory failure (HCC)   Iron deficiency anemia   Cough with hemoptysis   Tobacco use disorder   Mood disorder (HCC)   Prolonged QT interval   Nausea without vomiting   Sore throat   Severe sepsis with acute hypoxemic respiratory failure due to HAP: Respiratory failure and hemoptysis resolved. -Completed 5 days of broad-spectrum antibiotics   Paroxysmal A-fib with RVR: Recently hospitalized for this and had cardioversion.  HR elevated to 150 in ED, likely provoked by pneumonia.  Off IV amiodarone. -P.o. amiodarone 400 mg twice daily for 1 week, then start p.o. amiodarone 200 mg daily on 1/15 -Metoprolol reduced to 50 mg twice daily -Cardiology planning cardioversion in 4 to 6 weeks once completely recovered from pneumonia -Continue home Xarelto.   Iron deficiency anemia: H&H stable after initial drop.  Likely hemodilution.  Iron saturation 8%.  -Received IV ferric gluconate 250 mg twice daily for 3 days.   Hemoptysis: No frank red blood other than some blood staining on phlegm.  Resolved.  H&H stable. -Continue with Xarelto.   Mild  hypocalcemia: Corrects to normal for hypoalbuminemia.   Hypothyroidism: TSH was low (0.26) on labs done 08/31/2022 and dose of Synthroid was adjusted during recent hospitalization per recommendation from endocrinology (Synthroid 200 mg daily except 100 mg on Sundays). -Continue Synthroid -Outpatient endocrinology follow-up   Essential hypertension: BP slightly elevated. -Lisinopril discontinued.  Started on losartan by cardiology. -Continue home clonidine patch -Continue metoprolol, dose reduced to 50 mg daily by cardiology   Hyperlipidemia -Continue Crestor   HFmrEF: Appears euvolemic.  TTE with LVEF of 40 to 45% in 08/2022.  BNP elevated to 460 which is slightly higher than prior.  Patient is not on diuretics at home. -Monitor fluid and respiratory status. -Cardiac meds as above   Mood disorder with anxiety -Continue Zoloft and Ativan as needed   Prolonged QT: QTc 514 -Avoid QT prolonging drugs   Nausea/sore throat: Seems to have resolved.   BPH -Continue Flomax   Tobacco use disorder: Reports smoking about 4 to 5 cigarettes a day. -Encouraged smoking cessation.   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 -Received Valtrex 2 g every 12 hours x 2 doses  Anxiety: Could be situational from acute illness.  Improved. -Outpatient follow-up with PCP           Vital signs Vitals:   09/22/22 2012 09/22/22 2051 09/23/22 0417 09/23/22 0921  BP: (!) 138/96  (!) 139/97 (!) 156/101  Pulse: 87 (!) 112    Temp: 98.2 F (36.8 C)  97.9 F (36.6 C) 97.9 F (36.6 C)  Resp: 20  20   Weight:   85.5 kg   SpO2: 95%  96%   TempSrc: Oral  Oral Oral  BMI (Calculated):   27.84      Discharge exam  GENERAL: No apparent distress.  Nontoxic. HEENT: MMM. vision and hearing grossly intact.  NECK: Supple.  No apparent JVD.  RESP:  No IWOB.  Fair aeration bilaterally. CVS: Irregular rhythm.  Normal rate. Heart sounds normal.  ABD/GI/GU: BS+. Abd soft, NTND.  MSK/EXT:  Moves  extremities. No apparent deformity. No edema.  SKIN: no apparent skin lesion or wound NEURO: Awake and alert. Oriented appropriately.  No apparent focal neuro deficit. PSYCH: Calm. Normal affect.   Discharge Instructions Discharge Instructions     Call MD for:  difficulty breathing, headache or visual disturbances   Complete by: As directed    Call MD for:  extreme fatigue   Complete by: As directed    Call MD for:  persistant dizziness or light-headedness   Complete by: As directed    Diet - low sodium heart healthy   Complete by: As directed    Discharge instructions   Complete by: As directed    It has been a pleasure taking care of you!  You were hospitalized due to pneumonia, coughing up blood and atrial fibrillation with fast heart rate for which you have been treated medically.  You completed antibiotic course for pneumonia.  You have been started on increased dose of amiodarone for you atrial fibrillation.  Please review your new medication list and the directions on your medications before you take them.  Follow-up with your primary care doctor in 1 to 2 weeks or sooner if needed.  Follow-up with  your cardiologist per cardiology recommendation. Avoid over-the-counter pain medication other than plain Tylenol while on Xarelto.  It is important that you quit smoking cigarettes.  You may use nicotine patch to help you quit smoking.  Nicotine patch is available over-the-counter.  You may also discuss other options to help you quit smoking with your primary care doctor. You can also talk to professional counselors at 1-800-QUIT-NOW ([phone]) for free smoking cessation counseling.   Take care,   Increase activity slowly   Complete by: As directed       Allergies as of 09/23/2022       Reactions   Sulfa Drugs Cross Reactors Hives   Codeine Other (See Comments)   Made him very jittery    Hydrochlorothiazide Other (See Comments)   Hyponatremia        Medication List      STOP taking these medications    lisinopril 40 MG tablet Commonly known as: ZESTRIL       TAKE these medications    amiodarone 200 MG tablet Commonly known as: PACERONE Take 2 tablets (400 mg total) by mouth 2 (two) times daily for 5 days, THEN 1 tablet (200 mg total) daily for 25 days. Start taking on: September 23, 2022 What changed: See the new instructions.   cloNIDine 0.1 mg/24hr patch Commonly known as: CATAPRES - Dosed in mg/24 hr APPLY 1 PATCH ONCE A WEEK What changed: See the new instructions. Notes to patient: Apply new patch every Tuesday    fluticasone 50 MCG/ACT nasal spray Commonly known as: FLONASE Place 2 sprays into both nostrils daily as needed for allergies.   levothyroxine 200 MCG tablet Commonly known as: SYNTHROID Take 1 tablet (200 mcg total) by mouth daily. Take as recommended by endocrine.  200 mg daily except for Sunday, take 100 mg.   losartan 50 MG tablet Commonly known as: COZAAR Take 1 tablet (50 mg total) by mouth daily.   metoprolol tartrate 50 MG tablet Commonly known as: LOPRESSOR Take 1 tablet (50 mg total) by mouth 2 (two) times daily. What changed:  medication strength how much to take   montelukast 10 MG tablet Commonly known as: SINGULAIR TAKE 1 TABLET BY MOUTH EVERYDAY AT BEDTIME What changed: See the new instructions.   ofloxacin 0.3 % ophthalmic solution Commonly known as: OCUFLOX Place 1 drop into the right eye 4 (four) times daily.   prednisoLONE acetate 1 % ophthalmic suspension Commonly known as: PRED FORTE Place 1 drop into the right eye 4 (four) times daily.   rivaroxaban 20 MG Tabs tablet Commonly known as: XARELTO Take 1 tablet (20 mg total) by mouth daily with supper.   rosuvastatin 20 MG tablet Commonly known as: CRESTOR Take 1 tablet (20 mg total) by mouth daily.   sertraline 50 MG tablet Commonly known as: ZOLOFT Take 1 tablet (50 mg total) by mouth daily. What changed: when to take this    tamsulosin 0.4 MG Caps capsule Commonly known as: FLOMAX TAKE 2 CAPSULES BY MOUTH EVERY DAY What changed: how much to take   Vitamin D-3 125 MCG (5000 UT) Tabs Take 5,000 Units by mouth daily.        Consultations: Cardiology  Procedures/Studies:   DG Chest 2 View  Result Date: 09/17/2022 CLINICAL DATA:  Worsening cough and shortness of breath. Reports coughing up blood this morning. EXAM: CHEST - 2 VIEW COMPARISON:  Radiograph 09/11/2022 FINDINGS: New area of airspace consolidation in the right upper lobe. Small area of consolidation  in the medial left lung base. Unchanged elevation of right hemidiaphragm. Stable heart size and mediastinal contours. No pneumothorax or large pleural effusion. IMPRESSION: New area of airspace consolidation in the right upper lobe and medial left lung base, suspicious for pneumonia. Electronically Signed   By: Keith Rake M.D.   On: 09/17/2022 17:48   ECHOCARDIOGRAM COMPLETE  Result Date: 09/12/2022    ECHOCARDIOGRAM REPORT   Patient Name:   MILANO ROSEVEAR [name] Date of Exam: 09/12/2022 Medical Rec #:  [id number]            Height:       69.0 in Accession #:    [id number]           Weight:       193.0 lb Date of Birth:  Nov 01, 1960            BSA:          2.035 m Patient Age:    62 years             BP:           119/92 mmHg Patient Gender: M                    HR:           130 bpm. Exam Location:  Inpatient Procedure: 2D Echo, Cardiac Doppler and Color Doppler Indications:    Atrial Fibrillation  History:        Patient has no prior history of Echocardiogram examinations.                 Diastolic HF, Arrythmias:Atrial Fibrillation; Risk                 Factors:Hypertension. Graves disease.  Sonographer:    Eartha Inch Referring Phys: Gwyndolyn Kaufman, E  Sonographer Comments: Image acquisition challenging due to patient body habitus. IMPRESSIONS  1. Left ventricular ejection fraction, by estimation, is 40 to 45%. The left ventricle has mildly  decreased function. The left ventricle demonstrates global hypokinesis. There is mild concentric left ventricular hypertrophy. Left ventricular diastolic function could not be evaluated.  2. Right ventricular systolic function is normal. The right ventricular size is mildly enlarged. There is normal pulmonary artery systolic pressure. The estimated right ventricular systolic pressure is 16.9 mmHg.  3. Left atrial size was moderately dilated.  4. Right atrial size was moderately dilated.  5. The mitral valve is normal in structure. Mild mitral valve regurgitation.  6. The aortic valve is tricuspid. Aortic valve regurgitation is not visualized. No aortic stenosis is present.  7. The inferior vena cava is normal in size with greater than 50% respiratory variability, suggesting right atrial pressure of 3 mmHg. FINDINGS  Left Ventricle: Left ventricular ejection fraction, by estimation, is 40 to 45%. The left ventricle has mildly decreased function. The left ventricle demonstrates global hypokinesis. The left ventricular internal cavity size was normal in size. There is  mild concentric left ventricular hypertrophy. Left ventricular diastolic function could not be evaluated due to atrial fibrillation. Left ventricular diastolic function could not be evaluated. Right Ventricle: The right ventricular size is mildly enlarged. No increase in right ventricular wall thickness. Right ventricular systolic function is normal. There is normal pulmonary artery systolic pressure. The tricuspid regurgitant velocity is 2.14  m/s, and with an assumed right atrial pressure of 3 mmHg, the estimated right ventricular systolic pressure is 45.0 mmHg. Left Atrium: Left atrial size was moderately dilated. Right Atrium:  Right atrial size was moderately dilated. Pericardium: There is no evidence of pericardial effusion. Mitral Valve: The mitral valve is normal in structure. Mild mitral valve regurgitation. Tricuspid Valve: The tricuspid valve  is normal in structure. Tricuspid valve regurgitation is trivial. Aortic Valve: The aortic valve is tricuspid. Aortic valve regurgitation is not visualized. No aortic stenosis is present. Pulmonic Valve: The pulmonic valve was normal in structure. Pulmonic valve regurgitation is not visualized. Aorta: The aortic root and ascending aorta are structurally normal, with no evidence of dilitation. Venous: The inferior vena cava is normal in size with greater than 50% respiratory variability, suggesting right atrial pressure of 3 mmHg. IAS/Shunts: The interatrial septum was not well visualized.  LEFT VENTRICLE PLAX 2D LVIDd:         4.50 cm LVIDs:         3.58 cm LV PW:         1.30 cm LV IVS:        1.30 cm  LEFT ATRIUM         Index LA diam:    4.36 cm 2.14 cm/m  TRICUSPID VALVE TR Peak grad:   18.3 mmHg TR Vmax:        214.00 cm/s Sanda Klein MD Electronically signed by Sanda Klein MD Signature Date/Time: 09/12/2022/3:02:12 PM    Final    DG Chest 2 View  Result Date: 09/11/2022 CLINICAL DATA:  Palpitations in a 62 year old male. EXAM: CHEST - 2 VIEW COMPARISON:  May 25, 2022 FINDINGS: Cardiomediastinal contours and hilar structures are stable. Mild increased interstitial markings are present in the chest. Elevated RIGHT hemidiaphragm with juxta diaphragmatic atelectasis. No lobar consolidation. No sign of pneumothorax. On limited assessment no acute skeletal process. IMPRESSION: 1. Mild increased interstitial markings in the chest may represent mild edema or atypical infection. 2. Elevated RIGHT hemidiaphragm with juxta diaphragmatic atelectasis. Electronically Signed   By: Zetta Bills M.D.   On: 09/11/2022 13:55       The results of significant diagnostics from this hospitalization (including imaging, microbiology, ancillary and laboratory) are listed below for reference.     Microbiology: Recent Results (from the past 240 hour(s))  Resp panel by RT-PCR (RSV, Flu A&B, Covid) Anterior  Nasal Swab     Status: None   Collection Time: 09/17/22  4:41 PM   Specimen: Anterior Nasal Swab  Result Value Ref Range Status   SARS Coronavirus 2 by RT PCR NEGATIVE NEGATIVE Final    Comment: (NOTE) SARS-CoV-2 target nucleic acids are NOT DETECTED.  The SARS-CoV-2 RNA is generally detectable in upper respiratory specimens during the acute phase of infection. The lowest concentration of SARS-CoV-2 viral copies this assay can detect is 138 copies/mL. A negative result does not preclude SARS-Cov-2 infection and should not be used as the sole basis for treatment or other patient management decisions. A negative result may occur with  improper specimen collection/handling, submission of specimen other than nasopharyngeal swab, presence of viral mutation(s) within the areas targeted by this assay, and inadequate number of viral copies(<138 copies/mL). A negative result must be combined with clinical observations, patient history, and epidemiological information. The expected result is Negative.  Fact Sheet for Patients:  EntrepreneurPulse.com.au  Fact Sheet for Healthcare Providers:  IncredibleEmployment.be  This test is no t yet approved or cleared by the [place] FDA and  has been authorized for detection and/or diagnosis of SARS-CoV-2 by FDA under an Emergency Use Authorization (EUA). This EUA will remain  in effect (meaning this  test can be used) for the duration of the COVID-19 declaration under Section 564(b)(1) of the Act, 21 U.S.C.section 360bbb-3(b)(1), unless the authorization is terminated  or revoked sooner.       Influenza A by PCR NEGATIVE NEGATIVE Final   Influenza B by PCR NEGATIVE NEGATIVE Final    Comment: (NOTE) The Xpert Xpress SARS-CoV-2/FLU/RSV plus assay is intended as an aid in the diagnosis of influenza from Nasopharyngeal swab specimens and should not be used as a sole basis for treatment. Nasal washings  and aspirates are unacceptable for Xpert Xpress SARS-CoV-2/FLU/RSV testing.  Fact Sheet for Patients: EntrepreneurPulse.com.au  Fact Sheet for Healthcare Providers: IncredibleEmployment.be  This test is not yet approved or cleared by the [place] FDA and has been authorized for detection and/or diagnosis of SARS-CoV-2 by FDA under an Emergency Use Authorization (EUA). This EUA will remain in effect (meaning this test can be used) for the duration of the COVID-19 declaration under Section 564(b)(1) of the Act, 21 U.S.C. section 360bbb-3(b)(1), unless the authorization is terminated or revoked.     Resp Syncytial Virus by PCR NEGATIVE NEGATIVE Final    Comment: (NOTE) Fact Sheet for Patients: EntrepreneurPulse.com.au  Fact Sheet for Healthcare Providers: IncredibleEmployment.be  This test is not yet approved or cleared by the [place] FDA and has been authorized for detection and/or diagnosis of SARS-CoV-2 by FDA under an Emergency Use Authorization (EUA). This EUA will remain in effect (meaning this test can be used) for the duration of the COVID-19 declaration under Section 564(b)(1) of the Act, 21 U.S.C. section 360bbb-3(b)(1), unless the authorization is terminated or revoked.  Performed at [facility], [address] [place], [place] [address]   Blood Culture (routine x 2)     Status: None   Collection Time: 09/17/22  8:35 PM   Specimen: BLOOD  Result Value Ref Range Status   Specimen Description   Final    BLOOD RIGHT ANTECUBITAL Performed at [facility], [address], [place], [place] [address]    Special Requests   Final    BOTTLES DRAWN AEROBIC AND ANAEROBIC Blood Culture adequate volume Performed at [facility], [address], [place], [place] [address]    Culture   Final    NO GROWTH 5 DAYS Performed at [facility], [place] [address] [place], [place] [address]    Report Status 09/22/2022 FINAL  Final  Blood Culture (routine x 2)     Status: None   Collection Time: 09/17/22  8:38 PM   Specimen: BLOOD  Result Value Ref Range Status   Specimen Description   Final    BLOOD LEFT ANTECUBITAL Performed at [facility], [address], [place], [place] [address]    Special Requests   Final    BOTTLES DRAWN AEROBIC AND ANAEROBIC Blood Culture adequate volume Performed at [facility], [address] [place],  [address]    Culture   Final    NO GROWTH 5 DAYS Performed at [facility], [place] [address] [place],  [address]    Report Status 09/22/2022 FINAL  Final  MRSA Next Gen by PCR, Nasal     Status: Abnormal   Collection Time: 09/18/22  8:45 PM   Specimen: Nasal Mucosa; Nasal Swab  Result Value Ref Range Status   MRSA by PCR Next Gen DETECTED (A) NOT DETECTED Final    Comment: RESULT CALLED TO, READ BACK BY AND VERIFIED WITH: F JONES,RN'@0112'$  09/19/22  [place] (NOTE) The GeneXpert MRSA Assay (FDA approved for NASAL specimens only), is one component of a comprehensive MRSA colonization surveillance program. It is not intended to diagnose MRSA infection nor to guide or monitor treatment for MRSA infections. Test performance is not FDA approved in patients less than 81 years old. Performed at [facility], [place] [address], [place], [place] [address]   Expectorated Sputum Assessment w Gram Stain, Rflx to Resp Cult     Status: None   Collection Time: 09/18/22  8:46 PM   Specimen: Sputum  Result Value Ref Range Status   Specimen Description SPUTUM  Final   Special Requests NONE  Final   Sputum evaluation   Final    THIS SPECIMEN IS ACCEPTABLE FOR SPUTUM CULTURE Performed at [facility], [place] [address] [place], [place] [address]    Report Status 09/19/2022 FINAL  Final  Culture, Respiratory w Gram Stain     Status: None    Collection Time: 09/18/22  8:46 PM   Specimen: SPU  Result Value Ref Range Status   Specimen Description SPUTUM  Final   Special Requests NONE Reflexed from [id number]  Final   Gram Stain   Final    FEW WBC PRESENT, PREDOMINANTLY PMN FEW GRAM POSITIVE COCCI IN PAIRS IN CLUSTERS    Culture   Final    RARE Normal respiratory flora-no Staph aureus or Pseudomonas seen Performed at [facility], [place] [address], [place], [place] [address]    Report Status 09/21/2022 FINAL  Final     Labs:  CBC: Recent Labs  Lab 09/17/22 1641 09/18/22 0750 09/19/22 0102 09/20/22 0041 09/21/22 0034 09/22/22 0114  WBC 23.4* 20.3* 13.8* 14.0* 11.6* 12.3*  NEUTROABS 20.6* 15.9*  --   --   --   --   HGB 15.0 12.3* 12.9* 13.2 13.1 13.2  HCT 45.3 37.0* 38.1* 38.4* 40.3 38.6*  MCV 92.4 91.6 91.1 90.8 92.4 90.4  PLT 281 212 204 231 226 246   BMP &GFR Recent Labs  Lab 09/19/22 0102 09/19/22 1214 09/20/22 0041 09/21/22 0034 09/22/22 0114 09/23/22 0039  NA 135  --  134* 134* 134* 134*  K 3.7  --  3.8 3.5 3.6 4.3  CL 101  --  98 99 100 101  CO2 30  --  '29 28 25 26  '$ GLUCOSE 126*  --  114* 96 89 109*  BUN <5*  --  <5* <5* <5* <5*  CREATININE 0.82  --  0.73 0.75 0.75 0.78  CALCIUM 8.1*  --  8.1* 8.3* 8.3* 8.4*  MG  --  2.0 1.8 1.9 1.9 2.0  PHOS  --   --  3.3 3.3 3.1 3.4   Estimated Creatinine Clearance: 102.4 mL/min (by C-G formula based on SCr of 0.78 mg/dL). Liver & Pancreas: Recent Labs  Lab 09/17/22 1641 09/20/22 0041 09/21/22 0034 09/22/22 0114 09/23/22 0039  AST 12*  --   --   --   --   ALT 21  --   --   --   --   ALKPHOS 96  --   --   --   --   BILITOT 0.8  --   --   --   --   PROT 7.1  --   --   --   --   ALBUMIN 4.3 2.9* 2.9* 2.9* 3.0*   No results for input(s): "LIPASE", "AMYLASE" in the last 168 hours. No results for input(s): "AMMONIA"  in the last 168 hours. Diabetic: No results for input(s): "HGBA1C" in the last 72 hours. Recent Labs  Lab 09/18/22 1344  GLUCAP  115*   Cardiac Enzymes: No results for input(s): "CKTOTAL", "CKMB", "CKMBINDEX", "TROPONINI" in the last 168 hours. No results for input(s): "PROBNP" in the last 8760 hours. Coagulation Profile: Recent Labs  Lab 09/17/22 1648  INR 1.4*   Thyroid Function Tests: No results for input(s): "TSH", "T4TOTAL", "FREET4", "T3FREE", "THYROIDAB" in the last 72 hours. Lipid Profile: No results for input(s): "CHOL", "HDL", "LDLCALC", "TRIG", "CHOLHDL", "LDLDIRECT" in the last 72 hours. Anemia Panel: Recent Labs    09/20/22 2159  FOLATE 17.6   Urine analysis:    Component Value Date/Time   COLORURINE YELLOW 05/09/2022 1158   APPEARANCEUR HAZY (A) 05/09/2022 1158   LABSPEC 1.016 05/09/2022 1158   PHURINE 5.0 05/09/2022 1158   GLUCOSEU NEGATIVE 05/09/2022 1158   GLUCOSEU NEGATIVE 03/31/2022 1030   HGBUR NEGATIVE 05/09/2022 1158   BILIRUBINUR NEGATIVE 05/09/2022 1158   BILIRUBINUR negative 05/22/2016 1323   BILIRUBINUR neg 03/22/2015 1017   KETONESUR 5 (A) 05/09/2022 1158   PROTEINUR NEGATIVE 05/09/2022 1158   UROBILINOGEN 0.2 03/31/2022 1030   NITRITE NEGATIVE 05/09/2022 1158   LEUKOCYTESUR NEGATIVE 05/09/2022 1158   Sepsis Labs: Invalid input(s): "PROCALCITONIN", "LACTICIDVEN"   SIGNED:  Mercy Riding, MD  [facility] 09/23/2022, 3:37 PM

## 2022-09-23 NOTE — Progress Notes (Signed)
Discharge instructions reviewed with pt.  Copy of instructions given to pt. Pt informed new scripts sent to his pharmacy for pick up.  Pt's son will be picking him up today, pt states his son is aware he is going home today, son gets off work at 3:00pm.   Pt to inform his nurse when son is on his way or here at hospital.      Pt's primary nurse made aware of the above.

## 2022-09-23 NOTE — TOC Initial Note (Signed)
Transition of Care (TOC) - Initial/Assessment Note    Patient Details  Name: Jesse LONGLEY [name] MRN: [id number] Date of Birth: 10/19/1960  Transition of Care [facility]) CM/SW Contact:    Zenon Mayo, RN Phone Number: 09/23/2022, 10:48 AM  Clinical Narrative:                 From home with wife, he is indep, he has no DME . He states his son , Lennette Bihari, will be transporting him home today. He has a follow up apt at [facility] [address] for hospital follow up.  Expected Discharge Plan: Home/Self Care Barriers to Discharge: No Barriers Identified   Patient Goals and CMS Choice Patient states their goals for this hospitalization and ongoing recovery are:: return home with wife   Choice offered to / list presented to : NA      Expected Discharge Plan and Services In-house Referral: NA Discharge Planning Services: CM Consult Post Acute Care Choice: NA Living arrangements for the past 2 months: Single Family Home Expected Discharge Date: 09/23/22               DME Arranged: N/A         HH Arranged: NA          Prior Living Arrangements/Services Living arrangements for the past 2 months: Single Family Home Lives with:: Spouse Patient language and need for interpreter reviewed:: Yes Do you feel safe going back to the place where you live?: Yes      Need for Family Participation in Patient Care: No (Comment) Care giver support system in place?: No (comment)   Criminal Activity/Legal Involvement Pertinent to Current Situation/Hospitalization: No - Comment as needed  Activities of Daily Living      Permission Sought/Granted                  Emotional Assessment   Attitude/Demeanor/Rapport: Engaged Affect (typically observed): Appropriate Orientation: : Oriented to Self, Oriented to Place, Oriented to  Time, Oriented to Situation Alcohol / Substance Use: Not Applicable Psych Involvement: No (comment)  Admission diagnosis:  Acute respiratory failure  with hypoxia ([place]) [J96.01] HAP (hospital-acquired pneumonia) [J18.9, Y95] Chronic atrial fibrillation with RVR ([place]) [I48.20] Patient Active Problem List   Diagnosis Date Noted   Prolonged QT interval 09/20/2022   Nausea without vomiting 09/20/2022   Sore throat 09/20/2022   Cough with hemoptysis 09/19/2022   Tobacco use disorder 09/19/2022   Mood disorder ([place]) 09/19/2022   Chronic atrial fibrillation with RVR ([place]) 09/18/2022   Severe sepsis ([place]) 09/18/2022   Acute hypoxemic respiratory failure ([place]) 09/18/2022   Iron deficiency anemia 09/18/2022   HAP (hospital-acquired pneumonia) 09/17/2022   Atrial fibrillation with RVR ([place]) 09/12/2022   URI (upper respiratory infection) 09/11/2022   AKI (acute kidney injury) ([place]) 05/09/2022   Vitamin D deficiency 03/31/2022   COPD GOLD 0/ emphysema on CT  03/15/2019   Visit for preventive health examination 02/03/2018   Prostate cancer screening 02/03/2018   Colon cancer screening 02/03/2018   Need for Tdap vaccination 02/03/2018   PVC's (premature ventricular contractions) 01/06/2017   Bone neoplasm 02/25/2015   Cigarette smoker 08/07/2013   Depression with anxiety 02/20/2012   ED (erectile dysfunction) 02/20/2012   Hx of Bell's palsy 02/20/2012   Amputated toe ([place]) 02/20/2012   Paroxysmal atrial fibrillation with RVR ([place]) 01/15/2011   Hypothyroidism following radioiodine therapy 01/15/2011   Essential hypertension 01/15/2011   Dyslipidemia 01/15/2011   PCP:  Pcp, No Pharmacy:  [id number] [place], [place] - [address] [place] [address]Phone: [phone]: [phone] [facility] [address]: [phone]: [phone]    Social Determinants of Health (SDOH) Social History: [place] No Food Insecurity (09/13/2022)  Housing: Low Risk   (09/13/2022)  Transportation Needs: No Transportation Needs (09/13/2022)  Utilities: Not At Risk (09/13/2022)  Depression (PHQ2-9): High Risk (05/15/2022)  Tobacco Use: High Risk (09/19/2022)   SDOH Interventions:     Readmission Risk Interventions    09/23/2022   10:46 AM  Readmission Risk Prevention Plan  Transportation Screening Complete  PCP or Specialist Appt within 3-5 Days Complete  HRI or Home Care Consult Complete  Palliative Care Screening Not Applicable  Medication Review (RN Care Manager) Complete

## 2022-09-29 ENCOUNTER — Inpatient Hospital Stay (INDEPENDENT_AMBULATORY_CARE_PROVIDER_SITE_OTHER): Payer: 59 | Admitting: Primary Care

## 2022-10-02 ENCOUNTER — Other Ambulatory Visit: Payer: Self-pay | Admitting: Internal Medicine

## 2022-10-05 NOTE — Progress Notes (Unsigned)
Cardiology Office Note Date:  10/07/2022  Patient ID:  Jesse BRODHEAD [name], DOB May 03, 1961, MRN [id number] PCP:  Maximiano Coss, NP  Cardiologist:  None Electrophysiologist: Cristopher Peru, MD   Chief Complaint: post-hospitalization follow-up  History of Present Illness: Jesse MACDONNELL [name] is a 62 y.o. male with PMH notable for persistent Afib, PVCs, HTN, COPD, tobacco use, hypothyroid; seen today for Cristopher Peru, MD for post-hospitalization electrophysiology followup.  He last saw Dr. Lovena Le 08/14/2022, afib had been well-controlled on flecainide '100mg'$  BID, but developed palpitations in Sept 2023 found to be in AF. At that visit, rate-control strategy decided upon; flecainide stopped, coreg uptitrated.   On 09/11/2022, patient was at ophtho appt and found to be in Afib w RVR to 160, transported to [facility]. He was given IV dilt, with initial rate improvement, but then went back up to 170. He was amio loaded gtt > PO. Lopressor further titrated up to '100mg'$  BID, s/p DCCV 09/15/22 with discharge that same day. He then presented to [place] ER 1/4 with hemoptysis, cough, SOB, hypotension, found to have HCAP. Was in NSR on presentation. Went back into Afib w RVR in 150s while in ER, transferred to [facility]. Restarted on amio gtt > '400mg'$  BID PO. Discharged 1/10. At discharge planned to lower amiodarone to '200mg'$  daily on 1/15  Today, he says that he continues to feel fatigue but it has slowly improving. No CP, papitations, SOB, syncope or presyncope. He had a cough when he left the hospital, but that has also improved. He is not sleeping well at night, cares for wife who is "child like". He requests clearance for his L eye cataract surgery.   He is diligently taking xarelto daily, no bleeding concerns. He states he has been taking amiodarone '200mg'$  BID and tomorrow starts '100mg'$  daily.    AAD History: Flecainide, stopped 08/2022; ineffective Amiodarone since 08/2022  Past Medical History:   Diagnosis Date   Allergy    Anxiety    Atrial fibrillation (HCC)    Bell palsy    Depression    Diastolic heart failure    Hypercholesterolemia    Hypertension    Pneumonia    Thyroid disease     Past Surgical History:  Procedure Laterality Date   CARDIOVERSION N/A 09/15/2022   Procedure: CARDIOVERSION;  Surgeon: Werner Lean, MD;  Location: [facility];  Service: Cardiovascular;  Laterality: N/A;   CHOLECYSTECTOMY     HERNIA REPAIR     KNEE SURGERY     VASECTOMY      Current Outpatient Medications  Medication Instructions   amiodarone (PACERONE) 200 mg, Oral, Daily   cloNIDine (CATAPRES - DOSED IN MG/24 HR) 0.1 mg/24hr patch APPLY 1 PATCH ONCE A WEEK   fluticasone (FLONASE) 50 MCG/ACT nasal spray 2 sprays, Each Nare, Daily PRN   levothyroxine (SYNTHROID) 200 mcg, Oral, Daily, Take as recommended by endocrine.  200 mg daily except for Sunday, take 100 mg.   losartan (COZAAR) 50 mg, Oral, Daily   metoprolol tartrate (LOPRESSOR) 50 mg, Oral, 2 times daily   montelukast (SINGULAIR) 10 MG tablet TAKE 1 TABLET BY MOUTH EVERYDAY AT BEDTIME   ofloxacin (OCUFLOX) 0.3 % ophthalmic solution 1 drop, Right Eye, 4 times daily   prednisoLONE acetate (PRED FORTE) 1 % ophthalmic suspension 1 drop, Right Eye, 4 times daily   rivaroxaban (XARELTO) 20 mg, Oral, Daily with supper   rosuvastatin (CRESTOR) 20 mg, Oral, Daily   sertraline (ZOLOFT) 50 mg, Oral, Daily  tamsulosin (FLOMAX) 0.4 MG CAPS capsule TAKE 2 CAPSULES BY MOUTH EVERY DAY   Vitamin D-3 5,000 Units, Oral, Daily    Social History:  The patient  reports that he has been smoking cigarettes. He started smoking about 44 years ago. He has a 20.00 pack-year smoking history. He has never used smokeless tobacco. He reports current alcohol use. He reports that he does not use drugs.   Family History:  The patient's family history includes Alcohol abuse in his father; Cirrhosis in his father; Heart attack in his mother; Heart  disease in his mother; Hypertension in his father; Kidney cancer in his mother; Mental illness in his brother; Other in his father and mother; Parkinsonism in his brother.  ROS:  Please see the history of present illness. All other systems are reviewed and otherwise negative.   PHYSICAL EXAM:  VS:  BP 126/84   Pulse 74   Ht '5\' 9"'$  (1.753 m)   Wt 197 lb 3.2 oz (89.4 kg)   SpO2 98%   BMI 29.12 kg/m  BMI: Body mass index is 29.12 kg/m.  GEN- The patient is well appearing, alert and oriented x 3 today.   HEENT: normocephalic, atraumatic; sclera clear, conjunctiva pink; hearing intact; oropharynx clear; neck supple, no JVP Lungs- Clear to ausculation bilaterally, normal work of breathing.  No wheezes, rales, rhonchi Heart- Irregularly irregular rate and rhythm, no murmurs, rubs or gallops, PMI not laterally displaced GI- soft, non-tender, non-distended, bowel sounds present, no hepatosplenomegaly Extremities- No peripheral edema. no clubbing or cyanosis; DP/PT/radial pulses 2+ bilaterally MS- no significant deformity or atrophy Skin- warm and dry, no rash or lesion Psych- euthymic mood, full affect Neuro- strength and sensation are intact   EKG is ordered. Personal review of EKG from today shows:  AF, rate 70bpm, RAD  Recent Labs: 08/31/2022: TSH 0.26 09/17/2022: ALT 21 09/20/2022: B Natriuretic Peptide 411.4 09/22/2022: Hemoglobin 13.2; Platelets 246 09/23/2022: BUN <5; Creatinine, Ser 0.78; Magnesium 2.0; Potassium 4.3; Sodium 134  03/31/2022: Cholesterol 167; HDL 52.70; LDL Cholesterol 84; Total CHOL/HDL Ratio 3; Triglycerides 155.0; VLDL 31.0   Estimated Creatinine Clearance: 104.5 mL/min (by C-G formula based on SCr of 0.78 mg/dL).   Wt Readings from Last 3 Encounters:  10/07/22 197 lb 3.2 oz (89.4 kg)  09/23/22 188 lb 9.6 oz (85.5 kg)  09/11/22 193 lb (87.5 kg)     Additional studies reviewed include: Previous EP, cardiology notes.   TTE, 09/12/2022  1. Left ventricular  ejection fraction, by estimation, is 40 to 45%. The left ventricle has mildly decreased function. The left ventricle demonstrates global hypokinesis. There is mild concentric left ventricular hypertrophy. Left ventricular diastolic function could not be evaluated.   2. Right ventricular systolic function is normal. The right ventricular size is mildly enlarged. There is normal pulmonary artery systolic pressure. The estimated right ventricular systolic pressure is 07.6 mmHg.   3. Left atrial size was moderately dilated.   4. Right atrial size was moderately dilated.   5. The mitral valve is normal in structure. Mild mitral valve regurgitation.   6. The aortic valve is tricuspid. Aortic valve regurgitation is not visualized. No aortic stenosis is present.   7. The inferior vena cava is normal in size with greater than 50% respiratory variability, suggesting right atrial pressure of 3 mmHg.   ASSESSMENT AND PLAN:  #) Afib w RVR Recently hospitalized with afib w RVR x 2, amio loaded both times.  Rate controled today Will lower amio to '200mg'$  daily  starting tomorrow, 1/25 - amio labs needed Continue Lopressor '50mg'$  BID DCCV in the near future Has appt with Dr. Lovena Le in mid-February after DCCV. Will re-assess long-term AF strategy at that time  #) hypercoag d/t AFib CHA2DS2-VASc Score = 1 [CHF History: 0, HTN History: 1, Diabetes History: 0, Stroke History: 0, Vascular Disease History: 0, Age Score: 0, Gender Score: 0].  Therefore, the patient's annual risk of stroke is 0.6 %. [place] - Xarelto '20mg'$  daily, appropriately dosed - updated labs as above    #) PVC - no burden currently  #) HTN - well-controlled at this time   Current medicines are reviewed at length with the patient today.   The patient has concerns regarding his medicines.  The following changes were made today:   REDUCE amiodarone to '200mg'$  daily starting tomorrow, 1/25  Labs/ tests ordered today include:  Orders Placed This  Encounter  Procedures   Comp Met (CMET)   TSH   T4, free   CBC   EKG 12-Lead     Disposition: Follow up with Dr. Lovena Le in  as scheduled    Signed, Mamie Levers, NP  10/07/22  11:26 AM  Electrophysiology [facility]

## 2022-10-05 NOTE — H&P (View-Only) (Signed)
Cardiology Office Note Date:  10/07/2022  Patient ID:  Jesse TIMSON [name], DOB July 10, 1961, MRN [id number] PCP:  Maximiano Coss, NP  Cardiologist:  None Electrophysiologist: Cristopher Peru, MD   Chief Complaint: post-hospitalization follow-up  History of Present Illness: Jesse HEFFLEY [name] is a 62 y.o. male with PMH notable for persistent Afib, PVCs, HTN, COPD, tobacco use, hypothyroid; seen today for Cristopher Peru, MD for post-hospitalization electrophysiology followup.  He last saw Dr. Lovena Le 08/14/2022, afib had been well-controlled on flecainide '100mg'$  BID, but developed palpitations in Sept 2023 found to be in AF. At that visit, rate-control strategy decided upon; flecainide stopped, coreg uptitrated.   On 09/11/2022, patient was at ophtho appt and found to be in Afib w RVR to 160, transported to [facility]. He was given IV dilt, with initial rate improvement, but then went back up to 170. He was amio loaded gtt > PO. Lopressor further titrated up to '100mg'$  BID, s/p DCCV 09/15/22 with discharge that same day. He then presented to [place] ER 1/4 with hemoptysis, cough, SOB, hypotension, found to have HCAP. Was in NSR on presentation. Went back into Afib w RVR in 150s while in ER, transferred to [facility]. Restarted on amio gtt > '400mg'$  BID PO. Discharged 1/10. At discharge planned to lower amiodarone to '200mg'$  daily on 1/15  Today, he says that he continues to feel fatigue but it has slowly improving. No CP, papitations, SOB, syncope or presyncope. He had a cough when he left the hospital, but that has also improved. He is not sleeping well at night, cares for wife who is "child like". He requests clearance for his L eye cataract surgery.   He is diligently taking xarelto daily, no bleeding concerns. He states he has been taking amiodarone '200mg'$  BID and tomorrow starts '100mg'$  daily.    AAD History: Flecainide, stopped 08/2022; ineffective Amiodarone since 08/2022  Past Medical History:   Diagnosis Date   Allergy    Anxiety    Atrial fibrillation (HCC)    Bell palsy    Depression    Diastolic heart failure    Hypercholesterolemia    Hypertension    Pneumonia    Thyroid disease     Past Surgical History:  Procedure Laterality Date   CARDIOVERSION N/A 09/15/2022   Procedure: CARDIOVERSION;  Surgeon: Werner Lean, MD;  Location: [facility];  Service: Cardiovascular;  Laterality: N/A;   CHOLECYSTECTOMY     HERNIA REPAIR     KNEE SURGERY     VASECTOMY      Current Outpatient Medications  Medication Instructions   amiodarone (PACERONE) 200 mg, Oral, Daily   cloNIDine (CATAPRES - DOSED IN MG/24 HR) 0.1 mg/24hr patch APPLY 1 PATCH ONCE A WEEK   fluticasone (FLONASE) 50 MCG/ACT nasal spray 2 sprays, Each Nare, Daily PRN   levothyroxine (SYNTHROID) 200 mcg, Oral, Daily, Take as recommended by endocrine.  200 mg daily except for Sunday, take 100 mg.   losartan (COZAAR) 50 mg, Oral, Daily   metoprolol tartrate (LOPRESSOR) 50 mg, Oral, 2 times daily   montelukast (SINGULAIR) 10 MG tablet TAKE 1 TABLET BY MOUTH EVERYDAY AT BEDTIME   ofloxacin (OCUFLOX) 0.3 % ophthalmic solution 1 drop, Right Eye, 4 times daily   prednisoLONE acetate (PRED FORTE) 1 % ophthalmic suspension 1 drop, Right Eye, 4 times daily   rivaroxaban (XARELTO) 20 mg, Oral, Daily with supper   rosuvastatin (CRESTOR) 20 mg, Oral, Daily   sertraline (ZOLOFT) 50 mg, Oral, Daily  tamsulosin (FLOMAX) 0.4 MG CAPS capsule TAKE 2 CAPSULES BY MOUTH EVERY DAY   Vitamin D-3 5,000 Units, Oral, Daily    Social History:  The patient  reports that he has been smoking cigarettes. He started smoking about 44 years ago. He has a 20.00 pack-year smoking history. He has never used smokeless tobacco. He reports current alcohol use. He reports that he does not use drugs.   Family History:  The patient's family history includes Alcohol abuse in his father; Cirrhosis in his father; Heart attack in his mother; Heart  disease in his mother; Hypertension in his father; Kidney cancer in his mother; Mental illness in his brother; Other in his father and mother; Parkinsonism in his brother.  ROS:  Please see the history of present illness. All other systems are reviewed and otherwise negative.   PHYSICAL EXAM:  VS:  BP 126/84   Pulse 74   Ht '5\' 9"'$  (1.753 m)   Wt 197 lb 3.2 oz (89.4 kg)   SpO2 98%   BMI 29.12 kg/m  BMI: Body mass index is 29.12 kg/m.  GEN- The patient is well appearing, alert and oriented x 3 today.   HEENT: normocephalic, atraumatic; sclera clear, conjunctiva pink; hearing intact; oropharynx clear; neck supple, no JVP Lungs- Clear to ausculation bilaterally, normal work of breathing.  No wheezes, rales, rhonchi Heart- Irregularly irregular rate and rhythm, no murmurs, rubs or gallops, PMI not laterally displaced GI- soft, non-tender, non-distended, bowel sounds present, no hepatosplenomegaly Extremities- No peripheral edema. no clubbing or cyanosis; DP/PT/radial pulses 2+ bilaterally MS- no significant deformity or atrophy Skin- warm and dry, no rash or lesion Psych- euthymic mood, full affect Neuro- strength and sensation are intact   EKG is ordered. Personal review of EKG from today shows:  AF, rate 70bpm, RAD  Recent Labs: 08/31/2022: TSH 0.26 09/17/2022: ALT 21 09/20/2022: B Natriuretic Peptide 411.4 09/22/2022: Hemoglobin 13.2; Platelets 246 09/23/2022: BUN <5; Creatinine, Ser 0.78; Magnesium 2.0; Potassium 4.3; Sodium 134  03/31/2022: Cholesterol 167; HDL 52.70; LDL Cholesterol 84; Total CHOL/HDL Ratio 3; Triglycerides 155.0; VLDL 31.0   Estimated Creatinine Clearance: 104.5 mL/min (by C-G formula based on SCr of 0.78 mg/dL).   Wt Readings from Last 3 Encounters:  10/07/22 197 lb 3.2 oz (89.4 kg)  09/23/22 188 lb 9.6 oz (85.5 kg)  09/11/22 193 lb (87.5 kg)     Additional studies reviewed include: Previous EP, cardiology notes.   TTE, 09/12/2022  1. Left ventricular  ejection fraction, by estimation, is 40 to 45%. The left ventricle has mildly decreased function. The left ventricle demonstrates global hypokinesis. There is mild concentric left ventricular hypertrophy. Left ventricular diastolic function could not be evaluated.   2. Right ventricular systolic function is normal. The right ventricular size is mildly enlarged. There is normal pulmonary artery systolic pressure. The estimated right ventricular systolic pressure is 25.4 mmHg.   3. Left atrial size was moderately dilated.   4. Right atrial size was moderately dilated.   5. The mitral valve is normal in structure. Mild mitral valve regurgitation.   6. The aortic valve is tricuspid. Aortic valve regurgitation is not visualized. No aortic stenosis is present.   7. The inferior vena cava is normal in size with greater than 50% respiratory variability, suggesting right atrial pressure of 3 mmHg.   ASSESSMENT AND PLAN:  #) Afib w RVR Recently hospitalized with afib w RVR x 2, amio loaded both times.  Rate controled today Will lower amio to '200mg'$  daily  starting tomorrow, 1/25 - amio labs needed Continue Lopressor '50mg'$  BID DCCV in the near future Has appt with Dr. Lovena Le in mid-February after DCCV. Will re-assess long-term AF strategy at that time  #) hypercoag d/t AFib CHA2DS2-VASc Score = 1 [CHF History: 0, HTN History: 1, Diabetes History: 0, Stroke History: 0, Vascular Disease History: 0, Age Score: 0, Gender Score: 0].  Therefore, the patient's annual risk of stroke is 0.6 %. [place] - Xarelto '20mg'$  daily, appropriately dosed - updated labs as above    #) PVC - no burden currently  #) HTN - well-controlled at this time   Current medicines are reviewed at length with the patient today.   The patient has concerns regarding his medicines.  The following changes were made today:   REDUCE amiodarone to '200mg'$  daily starting tomorrow, 1/25  Labs/ tests ordered today include:  Orders Placed This  Encounter  Procedures   Comp Met (CMET)   TSH   T4, free   CBC   EKG 12-Lead     Disposition: Follow up with Dr. Lovena Le in  as scheduled    Signed, Mamie Levers, NP  10/07/22  11:26 AM  Electrophysiology [facility]

## 2022-10-07 ENCOUNTER — Ambulatory Visit: Payer: 59 | Attending: Student | Admitting: Cardiology

## 2022-10-07 ENCOUNTER — Encounter: Payer: Self-pay | Admitting: Student

## 2022-10-07 VITALS — BP 126/84 | HR 74 | Ht 69.0 in | Wt 197.2 lb

## 2022-10-07 DIAGNOSIS — I480 Paroxysmal atrial fibrillation: Secondary | ICD-10-CM | POA: Diagnosis not present

## 2022-10-07 DIAGNOSIS — I10 Essential (primary) hypertension: Secondary | ICD-10-CM | POA: Diagnosis not present

## 2022-10-07 DIAGNOSIS — I4819 Other persistent atrial fibrillation: Secondary | ICD-10-CM

## 2022-10-07 DIAGNOSIS — D6869 Other thrombophilia: Secondary | ICD-10-CM

## 2022-10-07 DIAGNOSIS — J449 Chronic obstructive pulmonary disease, unspecified: Secondary | ICD-10-CM

## 2022-10-07 DIAGNOSIS — I493 Ventricular premature depolarization: Secondary | ICD-10-CM | POA: Diagnosis not present

## 2022-10-07 MED ORDER — AMIODARONE HCL 200 MG PO TABS: 200.0000 mg | ORAL_TABLET | Freq: Every day | ORAL | 3 refills | Status: DC

## 2022-10-07 NOTE — Patient Instructions (Signed)
Medication Instructions:   Your physician recommends that you continue on your current medications as directed. Please refer to the Current Medication list given to you today.   *If you need a refill on your cardiac medications before your next appointment, please call your pharmacy*   Lab Work:  Your physician recommends that you return for lab work on Monday, February 5. You can come in on the day of your appointment anytime between 7:30-4:30.    If you have labs (blood work) drawn today and your tests are completely normal, you will receive your results only by: [place] (if you have MyChart) OR A paper copy in the mail If you have any lab test that is abnormal or we need to change your treatment, we will call you to review the results.   Testing/Procedures:      Dear Jesse Pigeon Dignan [name]  You are scheduled for a Cardioversion on Thursday, February 8 with Dr. Debara Pickett.  Please arrive at the [facility] (Main Entrance A) at [facility]: [address] [place], [place] [address] at 6:30 AM.   DIET:  Nothing to eat or drink after midnight except a sip of water with medications (see medication instructions below)  MEDICATION INSTRUCTIONS:  Continue taking your anticoagulant (blood thinner): Rivaroxaban (Xarelto).  You will need to continue this after your procedure until you are told by your provider that it is safe to stop.    LABS:   Your physician recommends that you return for lab work on Monday, February 5. You can come in on the day of your appointment anytime between 7:30-4:30.  FYI:  For your safety, and to allow [place] to monitor your vital signs accurately during the surgery/procedure we request: If you have artificial nails, gel coating, SNS etc, please have those removed prior to your surgery/procedure. Not having the nail coverings /polish removed may result in cancellation or delay of your surgery/procedure.  You must have a responsible person to drive  you home and stay in the waiting area during your procedure. Failure to do so could result in cancellation.  Bring your insurance cards.  *Special Note: Every effort is made to have your procedure done on time. Occasionally there are emergencies that occur at the hospital that may cause delays. Please be patient if a delay does occur.       Follow-Up: At [facility], you and your health needs are our priority.  As part of our continuing mission to provide you with exceptional heart care, we have created designated Provider Care Teams.  These Care Teams include your primary Cardiologist (physician) and Advanced Practice Providers (APPs -  Physician Assistants and Nurse Practitioners) who all work together to provide you with the care you need, when you need it.  We recommend signing up for the patient portal called "MyChart".  Sign up information is provided on this After Visit Summary.  MyChart is used to connect with patients for Virtual Visits (Telemedicine).  Patients are able to view lab/test results, encounter notes, upcoming appointments, etc.  Non-urgent messages can be sent to your provider as well.   To learn more about what you can do with MyChart, go to [email or web address].    Your next appointment:   1 month(s)  Provider:   Cristopher Peru, MD

## 2022-10-08 ENCOUNTER — Encounter: Payer: Self-pay | Admitting: Internal Medicine

## 2022-10-08 ENCOUNTER — Ambulatory Visit: Payer: 59 | Admitting: Internal Medicine

## 2022-10-08 VITALS — BP 127/81 | HR 82 | Temp 97.2°F | Ht 69.0 in | Wt 199.2 lb

## 2022-10-08 DIAGNOSIS — F17200 Nicotine dependence, unspecified, uncomplicated: Secondary | ICD-10-CM

## 2022-10-08 DIAGNOSIS — Z23 Encounter for immunization: Secondary | ICD-10-CM | POA: Diagnosis not present

## 2022-10-08 DIAGNOSIS — I480 Paroxysmal atrial fibrillation: Secondary | ICD-10-CM

## 2022-10-08 DIAGNOSIS — M5136 Other intervertebral disc degeneration, lumbar region: Secondary | ICD-10-CM

## 2022-10-08 DIAGNOSIS — F325 Major depressive disorder, single episode, in full remission: Secondary | ICD-10-CM | POA: Diagnosis not present

## 2022-10-08 DIAGNOSIS — E785 Hyperlipidemia, unspecified: Secondary | ICD-10-CM

## 2022-10-08 DIAGNOSIS — E559 Vitamin D deficiency, unspecified: Secondary | ICD-10-CM

## 2022-10-08 DIAGNOSIS — N319 Neuromuscular dysfunction of bladder, unspecified: Secondary | ICD-10-CM

## 2022-10-08 DIAGNOSIS — E890 Postprocedural hypothyroidism: Secondary | ICD-10-CM

## 2022-10-08 DIAGNOSIS — F17210 Nicotine dependence, cigarettes, uncomplicated: Secondary | ICD-10-CM

## 2022-10-08 DIAGNOSIS — F418 Other specified anxiety disorders: Secondary | ICD-10-CM

## 2022-10-08 DIAGNOSIS — N179 Acute kidney failure, unspecified: Secondary | ICD-10-CM

## 2022-10-08 DIAGNOSIS — I10 Essential (primary) hypertension: Secondary | ICD-10-CM

## 2022-10-08 DIAGNOSIS — R197 Diarrhea, unspecified: Secondary | ICD-10-CM | POA: Insufficient documentation

## 2022-10-08 DIAGNOSIS — I482 Chronic atrial fibrillation, unspecified: Secondary | ICD-10-CM

## 2022-10-08 DIAGNOSIS — J302 Other seasonal allergic rhinitis: Secondary | ICD-10-CM

## 2022-10-08 MED ORDER — TAMSULOSIN HCL 0.4 MG PO CAPS: 0.4000 mg | ORAL_CAPSULE | Freq: Every day | ORAL | 3 refills | Status: DC

## 2022-10-08 MED ORDER — FLUOXETINE HCL 20 MG PO TABS: 20.0000 mg | ORAL_TABLET | Freq: Every day | ORAL | 0 refills | Status: DC

## 2022-10-08 MED ORDER — METOPROLOL TARTRATE 50 MG PO TABS: 50.0000 mg | ORAL_TABLET | Freq: Two times a day (BID) | ORAL | 2 refills | Status: DC

## 2022-10-08 MED ORDER — ROSUVASTATIN CALCIUM 40 MG PO TABS: 40.0000 mg | ORAL_TABLET | Freq: Every day | ORAL | 3 refills | Status: DC

## 2022-10-08 MED ORDER — RIVAROXABAN 20 MG PO TABS: 20.0000 mg | ORAL_TABLET | Freq: Every day | ORAL | 5 refills | Status: DC

## 2022-10-08 MED ORDER — LOSARTAN POTASSIUM 50 MG PO TABS: 50.0000 mg | ORAL_TABLET | Freq: Every day | ORAL | 1 refills | Status: DC

## 2022-10-08 MED ORDER — CYCLOBENZAPRINE HCL 10 MG PO TABS: 10.0000 mg | ORAL_TABLET | Freq: Three times a day (TID) | ORAL | 0 refills | Status: DC | PRN

## 2022-10-08 MED ORDER — VITAMIN D-3 125 MCG (5000 UT) PO TABS: 5000.0000 [IU] | ORAL_TABLET | Freq: Every day | ORAL | 3 refills | Status: DC

## 2022-10-08 MED ORDER — CELECOXIB 100 MG PO CAPS: 100.0000 mg | ORAL_CAPSULE | Freq: Two times a day (BID) | ORAL | 3 refills | Status: DC

## 2022-10-08 MED ORDER — FLUTICASONE PROPIONATE 50 MCG/ACT NA SUSP: 2.0000 | Freq: Every day | NASAL | 11 refills | Status: DC | PRN

## 2022-10-08 MED ORDER — NICOTINE 21 MG/24HR TD PT24: 21.0000 mg | MEDICATED_PATCH | Freq: Every day | TRANSDERMAL | 11 refills | Status: DC

## 2022-10-08 MED ORDER — CLONIDINE 0.1 MG/24HR TD PTWK: 0.1000 mg | MEDICATED_PATCH | TRANSDERMAL | 3 refills | Status: DC

## 2022-10-08 NOTE — Patient Instructions (Signed)
It was a pleasure seeing you today!  Your health and satisfaction are my top priorities. If you believe your experience today was worthy of a 5-star rating, I'd be grateful for your feedback! Loralee Pacas, MD   CHECKOUT CHECKLIST  '[]'$    Schedule next appointment(s):    No follow-ups on file. Schedule 1 m follow up for medication problems for back pain and prozac Any requested lab visits should be scheduled as appointments too  If you are not doing well:  Return to the office sooner Please bring all your medicine bottles to each appointment If your condition begins to worsen or become severe:  go to the emergency room or even call 911  Call [phone] for the patches to be free.  '[]'$   (Optional):  Review your clinical notes on MyChart after they are completed.     Today's draft of the physician documented plan for today's visit: (final revisions will be visible on MyChart chart later) Major depressive disorder, single episode, in remission ([place]) -     FLUoxetine HCl; Take 1 tablet (20 mg total) by mouth daily.  Dispense: 30 tablet; Refill: 0  Depression with anxiety Assessment & Plan: Agreed to resume Prozac Depression and anxiety associated with caregiver fatigue     AKI (acute kidney injury) ([place])  Need for Tdap vaccination  Hypothyroidism following radioiodine therapy  Essential hypertension -     cloNIDine; Place 1 patch (0.1 mg total) onto the skin once a week.  Dispense: 12 patch; Refill: 3 -     Losartan Potassium; Take 1 tablet (50 mg total) by mouth daily.  Dispense: 90 tablet; Refill: 1 -     Metoprolol Tartrate; Take 1 tablet (50 mg total) by mouth 2 (two) times daily.  Dispense: 60 tablet; Refill: 2  Paroxysmal atrial fibrillation with RVR (HCC) -     Rivaroxaban; Take 1 tablet (20 mg total) by mouth daily with supper.  Dispense: 60 tablet; Refill: 5  Seasonal allergies -     Fluticasone Propionate; Place 2 sprays into both nostrils daily as needed for  allergies.  Dispense: 9.9 g; Refill: 11  Vitamin D deficiency -     Vitamin D-3; Take 5,000 Units by mouth daily.  Dispense: 90 tablet; Refill: 3  Bladder dysfunction -     Tamsulosin HCl; Take 1 capsule (0.4 mg total) by mouth daily.  Dispense: 90 capsule; Refill: 3  Lumbar degenerative disc disease -     Cyclobenzaprine HCl; Take 1 tablet (10 mg total) by mouth 3 (three) times daily as needed for muscle spasms.  Dispense: 30 tablet; Refill: 0  Smoking -     Nicotine; Place 1 patch (21 mg total) onto the skin daily.  Dispense: 28 patch; Refill: 11  Other orders -     Rosuvastatin Calcium; Take 1 tablet (40 mg total) by mouth daily. Replaces 20 mg dose  Dispense: 90 tablet; Refill: 3 -     Celecoxib; Take 1 capsule (100 mg total) by mouth 2 (two) times daily.  Dispense: 180 capsule; Refill: 3      QUESTIONS & CONCERNS: CLINICAL: please contact us via phone [phone] OR MyChart messaging  LAB & IMAGING:   We will call you if the results are significantly abnormal or you don't use MyChart.  Most normal results will be posted to MyChart immediately and have a clinical review message by Dr. Randol Kern posted within 2-3 business days.   If you have not heard from [place] regarding  the results in 2 weeks OR if you need priority reporting, please contact this office. MYCHART:  The fastest way to get your results and easiest way to stay in touch with [place] is by activating your My Chart account. Instructions are located on the last page of this paperwork.  BILLING: xray and lab orders are billed from separate companies and questions./concerns should be directed to the [facility].  For visit charges please discuss with our administrative services COMPLAINTS:  please let Dr. Randol Kern know or see the [facility], by asking at the front desk: we want you to be satisfied with every experience and we would be grateful for the opportunity to address any  problems

## 2022-10-08 NOTE — Assessment & Plan Note (Signed)
Agreed to resume Prozac Depression and anxiety associated with caregiver fatigue

## 2022-10-08 NOTE — Assessment & Plan Note (Signed)
Individualized Hypertension Management: Stable, now controlled at  BP Readings from Last 1 Encounters:  10/08/22 127/81    Goal blood pressure of less than 140/90 explained Resistant/secondary hypertension workup: not necessary in my opinion Current hypertension medications:       Sig   cloNIDine (CATAPRES - DOSED IN MG/24 HR) 0.1 mg/24hr patch (Taking) APPLY 1 PATCH ONCE A WEEK    Patient taking differently: Place 0.1 mg onto the skin once a week.   losartan (COZAAR) 50 MG tablet (Taking) Take 1 tablet (50 mg total) by mouth daily.   metoprolol tartrate (LOPRESSOR) 50 MG tablet (Taking) Take 1 tablet (50 mg total) by mouth 2 (two) times daily.       Adjust medications as follows:  Stay  on same  Standardized hypertension counseling and management: Counseled him to limit: salt, alcohol, NSAIDS, excess body weight. Have explained risks of poor control are FUTURE stroke and heart attacks Encouragement for home blood pressure monitoring Explained Red Flag symptoms for ER: if blood pressure over 180 AND new headache, shortness of breath, confusion, or chest discomfort See AFTER VISIT SUMMARY for addition educational information provided Offered to refill meds.

## 2022-10-08 NOTE — Progress Notes (Signed)
[place]  Phone: [phone]  New patient visit  Visit Date: 10/08/2022 Patient: Jesse DESROCHES [name]   DOB: [birth year]   62 y.o. Male  MRN: [id number]  Today's healthcare provider: Loralee Pacas, MD  Assessment and Plan:   Jesse Suarez was seen today for new patient (initial visit), pneumonia/hospital follow-up and medication managemement.  Major depressive disorder, single episode, in remission (HCC) -     FLUoxetine HCl; Take 1 tablet (20 mg total) by mouth daily.  Dispense: 30 tablet; Refill: 0  Depression with anxiety Overview: Patient reports was doing well on Prozac then moved to Zoloft and felt like zombie so stopped it.  Assessment & Plan: Agreed to resume Prozac Depression and anxiety associated with caregiver fatigue     AKI (acute kidney injury) ([place]) Overview: Lab Results  Component Value Date/Time   CREATININE 0.78 09/23/2022 12:39 AM   CREATININE 0.75 09/22/2022 01:14 AM   CREATININE 0.75 09/21/2022 12:34 AM   CREATININE 0.73 09/20/2022 12:41 AM   CREATININE 0.82 09/19/2022 01:02 AM   CREATININE 0.98 01/06/2017 04:17 PM   CREATININE 0.84 05/22/2016 01:08 PM   CREATININE 0.92 03/22/2015 10:07 AM   CREATININE 1.02 08/07/2013 06:14 PM   CREATININE 0.88 01/18/2013 06:04 PM   Resolved will resolve   Need for Tdap vaccination Overview: Immunization History  Administered Date(s) Administered   Influenza Inj Mdck Quad Pf 06/17/2017   Influenza, Quadrivalent, Recombinant, Inj, Pf 06/10/2020   Influenza,inj,Quad PF,6+ Mos 06/13/2013, 05/22/2016, 04/27/2018, 06/06/2019, 05/15/2022, 07/08/2022   Influenza-Unspecified 05/16/2015, 06/14/2017   PFIZER(Purple Top)SARS-COV-2 Vaccination 02/11/2020, 03/03/2020, 09/20/2020   Tdap 02/03/2018   Zoster Recombinat (Shingrix) 06/26/2021, 08/22/2021   resolved   Hypothyroidism following radioiodine therapy Overview: Dr. Elayne Suarez on 06/21/12.   Last TSH was performed on 04/12/12 and it was  0.17 and is now 0.03. Free T4 was 0.86, now it is 0.99. Previous T3 high normal at 4.2.    Essential hypertension Assessment & Plan: Individualized Hypertension Management: Stable, now controlled at  BP Readings from Last 1 Encounters:  10/08/22 127/81    Goal blood pressure of less than 140/90 explained Resistant/secondary hypertension workup: not necessary in my opinion Current hypertension medications:       Sig   cloNIDine (CATAPRES - DOSED IN MG/24 HR) 0.1 mg/24hr patch (Taking) APPLY 1 PATCH ONCE A WEEK    Patient taking differently: Place 0.1 mg onto the skin once a week.   losartan (COZAAR) 50 MG tablet (Taking) Take 1 tablet (50 mg total) by mouth daily.   metoprolol tartrate (LOPRESSOR) 50 MG tablet (Taking) Take 1 tablet (50 mg total) by mouth 2 (two) times daily.       Adjust medications as follows:  Stay  on same  Standardized hypertension counseling and management: Counseled him to limit: salt, alcohol, NSAIDS, excess body weight. Have explained risks of poor control are FUTURE stroke and heart attacks Encouragement for home blood pressure monitoring Explained Red Flag symptoms for ER: if blood pressure over 180 AND new headache, shortness of breath, confusion, or chest discomfort See AFTER VISIT SUMMARY for addition educational information provided Offered to refill meds.   Orders: -     cloNIDine; Place 1 patch (0.1 mg total) onto the skin once a week.  Dispense: 12 patch; Refill: 3 -     Losartan Potassium; Take 1 tablet (50 mg total) by mouth daily.  Dispense: 90 tablet; Refill: 1 -     Metoprolol Tartrate; Take 1 tablet (50  mg total) by mouth 2 (two) times daily.  Dispense: 60 tablet; Refill: 2  Paroxysmal atrial fibrillation with RVR (HCC) -     Rivaroxaban; Take 1 tablet (20 mg total) by mouth daily with supper.  Dispense: 60 tablet; Refill: 5  Seasonal allergies -     Fluticasone Propionate; Place 2 sprays into both nostrils daily as needed for  allergies.  Dispense: 9.9 g; Refill: 11  Vitamin D deficiency -     Vitamin D-3; Take 5,000 Units by mouth daily.  Dispense: 90 tablet; Refill: 3  Bladder dysfunction -     Tamsulosin HCl; Take 1 capsule (0.4 mg total) by mouth daily.  Dispense: 90 capsule; Refill: 3  Lumbar degenerative disc disease -     Cyclobenzaprine HCl; Take 1 tablet (10 mg total) by mouth 3 (three) times daily as needed for muscle spasms.  Dispense: 30 tablet; Refill: 0 -     Celecoxib; Take 1 capsule (100 mg total) by mouth 2 (two) times daily.  Dispense: 180 capsule; Refill: 3  Smoking -     Nicotine; Place 1 patch (21 mg total) onto the skin daily.  Dispense: 28 patch; Refill: 11  Chronic atrial fibrillation with RVR (HCC) -     Rivaroxaban; Take 1 tablet (20 mg total) by mouth daily with supper.  Dispense: 60 tablet; Refill: 5  Dyslipidemia -     Celecoxib; Take 1 capsule (100 mg total) by mouth 2 (two) times daily.  Dispense: 180 capsule; Refill: 3  Other orders -     Rosuvastatin Calcium; Take 1 tablet (40 mg total) by mouth daily. Replaces 20 mg dose  Dispense: 90 tablet; Refill: 3      Subjective:  Patient presents today to establish care.  Chief Complaint  Patient presents with   New Patient (Initial Visit)    Low iron-was given with IV at the hospital.   Pneumonia/hospital follow-up    Was admitted to hospital 1/4.   Medication managemement    Does not want hydroxyzine. Medication needed for depression and anxiety (nerves).  We spent most of time medication reconciliation and refilling almost every medication on his list, while cleaning up his problem list in the chart so that his care could be transferred with a concise chart that reflects his active medical issues We also resumed Prozac which worked for him in past for mood   Problem-oriented charting was used to develop and update his medical history: Problem  Lumbar Degenerative Disc Disease  Bladder Dysfunction  Seasonal Allergies   Major Depressive Disorder, Single Episode, in Remission (Hcc)  Depression With Anxiety   Patient reports was doing well on Prozac then moved to Zoloft and felt like zombie so stopped it.   Hypothyroidism Following Radioiodine Therapy   Dr. Elayne Suarez on 06/21/12.   Last TSH was performed on 04/12/12 and it was 0.17 and is now 0.03. Free T4 was 0.86, now it is 0.99. Previous T3 high normal at 4.2.    Essential Hypertension  Diarrhea (Resolved)  Nausea Without Vomiting (Resolved)  Sore Throat (Resolved)  Cough With Hemoptysis (Resolved)  Chronic Atrial Fibrillation With Rvr (Hcc) (Resolved)  Severe Sepsis (Hcc) (Resolved)  Acute Hypoxemic Respiratory Failure (Hcc) (Resolved)  Hap (Hospital-Acquired Pneumonia) (Resolved)  Atrial Fibrillation With Rvr (Hcc) (Resolved)  URI (Upper Respiratory Infection) (Resolved)  Aki (Acute Kidney Injury) (Hcc) (Resolved)   Lab Results  Component Value Date/Time   CREATININE 0.78 09/23/2022 12:39 AM   CREATININE 0.75 09/22/2022 01:14 AM  CREATININE 0.75 09/21/2022 12:34 AM   CREATININE 0.73 09/20/2022 12:41 AM   CREATININE 0.82 09/19/2022 01:02 AM   CREATININE 0.98 01/06/2017 04:17 PM   CREATININE 0.84 05/22/2016 01:08 PM   CREATININE 0.92 03/22/2015 10:07 AM   CREATININE 1.02 08/07/2013 06:14 PM   CREATININE 0.88 01/18/2013 06:04 PM   Resolved will resolve   Visit for Preventive Health Examination (Resolved)  Prostate Cancer Screening (Resolved)  Colon Cancer Screening (Resolved)  Need for Tdap Vaccination (Resolved)   Immunization History  Administered Date(s) Administered   Influenza Inj Mdck Quad Pf 06/17/2017   Influenza, Quadrivalent, Recombinant, Inj, Pf 06/10/2020   Influenza,inj,Quad PF,6+ Mos 06/13/2013, 05/22/2016, 04/27/2018, 06/06/2019, 05/15/2022, 07/08/2022   Influenza-Unspecified 05/16/2015, 06/14/2017   PFIZER(Purple Top)SARS-COV-2 Vaccination 02/11/2020, 03/03/2020, 09/20/2020   Tdap 02/03/2018   Zoster Recombinat  (Shingrix) 06/26/2021, 08/22/2021   resolved      Depression Screen    10/08/2022    8:32 AM 05/15/2022   12:07 PM 03/31/2022    9:42 AM 01/06/2022   11:45 AM  PHQ 2/9 Scores  PHQ - 2 Score 2 4 0 0  PHQ- 9 Score 8 18 0 2   No results found for any visits on 10/08/22.  The following were reviewed and entered/updated into his [facility] History:  Diagnosis Date   Acute hypoxemic respiratory failure ([place]) 09/18/2022   Allergy    Anxiety    Atrial fibrillation (HCC)    Atrial fibrillation with RVR ([place]) 09/12/2022   Bell palsy    Depression    Diastolic heart failure    HAP (hospital-acquired pneumonia) 09/17/2022   Hypercholesterolemia    Hypertension    Pneumonia    Thyroid disease    Past Surgical History:  Procedure Laterality Date   CARDIOVERSION N/A 09/15/2022   Procedure: CARDIOVERSION;  Surgeon: Werner Lean, MD;  Location: [facility];  Service: Cardiovascular;  Laterality: N/A;   CHOLECYSTECTOMY     HERNIA REPAIR     KNEE SURGERY     VASECTOMY     Family History  Problem Relation Age of Onset   Heart disease Mother    Heart attack Mother    Other Mother        thyroid problems   Kidney cancer Mother    Cirrhosis Father    Other Father        thyroid problems   Alcohol abuse Father    Hypertension Father    Mental illness Brother    Parkinsonism Brother    Outpatient Medications Prior to Visit  Medication Sig Dispense Refill   amiodarone (PACERONE) 200 MG tablet Take 1 tablet (200 mg total) by mouth daily. 90 tablet 3   levothyroxine (SYNTHROID) 200 MCG tablet Take 1 tablet (200 mcg total) by mouth daily. Take as recommended by endocrine.  200 mg daily except for Sunday, take 100 mg. 90 tablet 3   ofloxacin (OCUFLOX) 0.3 % ophthalmic solution Place 1 drop into the right eye 4 (four) times daily.     prednisoLONE acetate (PRED FORTE) 1 % ophthalmic suspension Place 1 drop into the right eye 4 (four) times daily.      Cholecalciferol (VITAMIN D-3) 125 MCG (5000 UT) TABS Take 5,000 Units by mouth daily.     cloNIDine (CATAPRES - DOSED IN MG/24 HR) 0.1 mg/24hr patch APPLY 1 PATCH ONCE A WEEK (Patient taking differently: Place 0.1 mg onto the skin once a week.) 12 patch 1   fluticasone (FLONASE) 50 MCG/ACT nasal spray  Place 2 sprays into both nostrils daily as needed for allergies.     losartan (COZAAR) 50 MG tablet Take 1 tablet (50 mg total) by mouth daily. 90 tablet 1   metoprolol tartrate (LOPRESSOR) 50 MG tablet Take 1 tablet (50 mg total) by mouth 2 (two) times daily. 60 tablet 2   montelukast (SINGULAIR) 10 MG tablet TAKE 1 TABLET BY MOUTH EVERYDAY AT BEDTIME (Patient taking differently: Take 10 mg by mouth at bedtime.) 90 tablet 1   rivaroxaban (XARELTO) 20 MG TABS tablet Take 1 tablet (20 mg total) by mouth daily with supper. 60 tablet 5   tamsulosin (FLOMAX) 0.4 MG CAPS capsule TAKE 2 CAPSULES BY MOUTH EVERY DAY (Patient taking differently: Take 0.4 mg by mouth daily.) 180 capsule 0   rosuvastatin (CRESTOR) 20 MG tablet Take 1 tablet (20 mg total) by mouth daily. (Patient not taking: Reported on 10/08/2022) 30 tablet 1   sertraline (ZOLOFT) 50 MG tablet Take 1 tablet (50 mg total) by mouth daily. (Patient not taking: Reported on 10/08/2022) 30 tablet 1   No facility-administered medications prior to visit.    Allergies  Allergen Reactions   Sulfa Drugs Cross Reactors Hives   Codeine Other (See Comments)    Made him very jittery    Hydrochlorothiazide Other (See Comments)    Hyponatremia   Social History   Tobacco Use   Smoking status: Every Day    Packs/day: 0.50    Years: 40.00    Total pack years: 20.00    Types: Cigarettes    Start date: 09/14/1978   Smokeless tobacco: Never  Vaping Use   Vaping Use: Never used  Substance Use Topics   Alcohol use: Yes    Alcohol/week: 0.0 standard drinks of alcohol    Comment: rarely   Drug use: No    Immunization History  Administered Date(s)  Administered   Influenza Inj Mdck Quad Pf 06/17/2017   Influenza, Quadrivalent, Recombinant, Inj, Pf 06/10/2020   Influenza,inj,Quad PF,6+ Mos 06/13/2013, 05/22/2016, 04/27/2018, 06/06/2019, 05/15/2022, 07/08/2022   Influenza-Unspecified 05/16/2015, 06/14/2017   PFIZER(Purple Top)SARS-COV-2 Vaccination 02/11/2020, 03/03/2020, 09/20/2020   Tdap 02/03/2018   Zoster Recombinat (Shingrix) 06/26/2021, 08/22/2021    Objective:  BP 127/81 (BP Location: Right Arm, Patient Position: Sitting)   Pulse 82   Temp (!) 97.2 F (36.2 C) (Temporal)   Ht '5\' 9"'$  (1.753 m)   Wt 199 lb 3.2 oz (90.4 kg)   SpO2 98%   BMI 29.42 kg/m  Body mass index is 29.42 kg/m. indicates this is an  Overweight male , but waist circumference is a better indicator of healthy body composition. Physical Exam  Vital signs reviewed.  Nursing notes reviewed. General Appearance/Constitutional:  polite male in no acute distress Musculoskeletal: amputated toes not examined Neurological:  Awake, alert,  No obvious focal neurological deficits or cognitive impairments Psychiatric:  Appropriate mood, pleasant demeanor Problem-specific findings:  friendly gentleman    Results Reviewed: Results for orders placed or performed during the hospital encounter of 09/17/22  Resp panel by RT-PCR (RSV, Flu A&B, Covid) Anterior Nasal Swab   Specimen: Anterior Nasal Swab  Result Value Ref Range   SARS Coronavirus 2 by RT PCR NEGATIVE NEGATIVE   Influenza A by PCR NEGATIVE NEGATIVE   Influenza B by PCR NEGATIVE NEGATIVE   Resp Syncytial Virus by PCR NEGATIVE NEGATIVE  Blood Culture (routine x 2)   Specimen: BLOOD  Result Value Ref Range   Specimen Description      BLOOD RIGHT  ANTECUBITAL Performed at [facility], [address] [place], [place] [address]    Special Requests      BOTTLES DRAWN AEROBIC AND ANAEROBIC Blood Culture adequate volume Performed at [facility], [address]  [place], [place] [address]    Culture      NO GROWTH 5 DAYS Performed at [facility], [place] [address] [place], [place] [address]    Report Status 09/22/2022 FINAL   Blood Culture (routine x 2)   Specimen: BLOOD  Result Value Ref Range   Specimen Description      BLOOD LEFT ANTECUBITAL Performed at [facility], [address], [place], [place] [address]    Special Requests      BOTTLES DRAWN AEROBIC AND ANAEROBIC Blood Culture adequate volume Performed at [facility], [address], [place], [place] [address]    Culture      NO GROWTH 5 DAYS Performed at [facility], [place] [address], [place], [place] [address]    Report Status 09/22/2022 FINAL   MRSA Next Gen by PCR, Nasal   Specimen: Nasal Mucosa; Nasal Swab  Result Value Ref Range   MRSA by PCR Next Gen DETECTED (A) NOT DETECTED  Expectorated Sputum Assessment w Gram Stain, Rflx to Resp Cult   Specimen: Sputum  Result Value Ref Range   Specimen Description SPUTUM    Special Requests NONE    Sputum evaluation      THIS SPECIMEN IS ACCEPTABLE FOR SPUTUM CULTURE Performed at [facility], [place] [address], [place], [place] [address]    Report Status 09/19/2022 FINAL   Culture, Respiratory w Gram Stain   Specimen: SPU  Result Value Ref Range   Specimen Description SPUTUM    Special Requests NONE Reflexed from [id number]    Gram Stain      FEW WBC PRESENT, PREDOMINANTLY PMN FEW GRAM POSITIVE COCCI IN PAIRS IN CLUSTERS    Culture      RARE Normal respiratory flora-no Staph aureus or Pseudomonas seen Performed at [facility] [address] [place],  [address]    Report Status 09/21/2022 FINAL   CBC with Differential  Result Value Ref Range   WBC 23.4 (H) 4.0 - 10.5 K/uL   RBC 4.90 4.22 - 5.81 MIL/uL   Hemoglobin 15.0 13.0 - 17.0 g/dL   HCT 45.3 39.0 - 52.0 %   MCV 92.4 80.0 - 100.0 fL   MCH 30.6 26.0 - 34.0 pg   MCHC 33.1 30.0 - 36.0 g/dL   RDW  12.8 11.5 - 15.5 %   Platelets 281 150 - 400 K/uL   nRBC 0.0 0.0 - 0.2 %   Neutrophils Relative % 87 %   Neutro Abs 20.6 (H) 1.7 - 7.7 K/uL   Lymphocytes Relative 5 %   Lymphs Abs 1.1 0.7 - 4.0 K/uL   Monocytes Relative 7 %   Monocytes Absolute 1.6 (H) 0.1 - 1.0 K/uL   Eosinophils Relative 0 %   Eosinophils Absolute 0.0 0.0 - 0.5 K/uL   Basophils Relative 0 %   Basophils Absolute 0.0 0.0 - 0.1 K/uL   Immature Granulocytes 1 %   Abs Immature Granulocytes 0.12 (H) 0.00 - 0.07 K/uL  Comprehensive metabolic panel  Result Value Ref Range   Sodium 139 135 - 145 mmol/L   Potassium 4.1 3.5 - 5.1 mmol/L   Chloride 100 98 - 111 mmol/L   CO2 29 22 - 32 mmol/L  Glucose, Bld 111 (H) 70 - 99 mg/dL   BUN 14 8 - 23 mg/dL   Creatinine, Ser 0.85 0.61 - 1.24 mg/dL   Calcium 9.2 8.9 - 10.3 mg/dL   Total Protein 7.1 6.5 - 8.1 g/dL   Albumin 4.3 3.5 - 5.0 g/dL   AST 12 (L) 15 - 41 U/L   ALT 21 0 - 44 U/L   Alkaline Phosphatase 96 38 - 126 U/L   Total Bilirubin 0.8 0.3 - 1.2 mg/dL   GFR, Estimated >60 >60 mL/min   Anion gap 10 5 - 15  Lactic acid, plasma  Result Value Ref Range   Lactic Acid, Venous 1.2 0.5 - 1.9 mmol/L  Protime-INR  Result Value Ref Range   Prothrombin Time 17.2 (H) 11.4 - 15.2 seconds   INR 1.4 (H) 0.8 - 1.2  CBC with Differential  Result Value Ref Range   WBC 20.3 (H) 4.0 - 10.5 K/uL   RBC 4.04 (L) 4.22 - 5.81 MIL/uL   Hemoglobin 12.3 (L) 13.0 - 17.0 g/dL   HCT 37.0 (L) 39.0 - 52.0 %   MCV 91.6 80.0 - 100.0 fL   MCH 30.4 26.0 - 34.0 pg   MCHC 33.2 30.0 - 36.0 g/dL   RDW 13.0 11.5 - 15.5 %   Platelets 212 150 - 400 K/uL   nRBC 0.0 0.0 - 0.2 %   Neutrophils Relative % 77 %   Neutro Abs 15.9 (H) 1.7 - 7.7 K/uL   Lymphocytes Relative 11 %   Lymphs Abs 2.2 0.7 - 4.0 K/uL   Monocytes Relative 11 %   Monocytes Absolute 2.1 (H) 0.1 - 1.0 K/uL   Eosinophils Relative 0 %   Eosinophils Absolute 0.1 0.0 - 0.5 K/uL   Basophils Relative 0 %   Basophils Absolute 0.1 0.0 -  0.1 K/uL   Immature Granulocytes 1 %   Abs Immature Granulocytes 0.10 (H) 0.00 - 0.07 K/uL  Basic metabolic panel  Result Value Ref Range   Sodium 137 135 - 145 mmol/L   Potassium 3.5 3.5 - 5.1 mmol/L   Chloride 100 98 - 111 mmol/L   CO2 29 22 - 32 mmol/L   Glucose, Bld 102 (H) 70 - 99 mg/dL   BUN 10 8 - 23 mg/dL   Creatinine, Ser 0.65 0.61 - 1.24 mg/dL   Calcium 8.1 (L) 8.9 - 10.3 mg/dL   GFR, Estimated >60 >60 mL/min   Anion gap 8 5 - 15  Lactic acid, plasma  Result Value Ref Range   Lactic Acid, Venous 0.8 0.5 - 1.9 mmol/L  Lactic acid, plasma  Result Value Ref Range   Lactic Acid, Venous 1.5 0.5 - 1.9 mmol/L  Basic metabolic panel  Result Value Ref Range   Sodium 135 135 - 145 mmol/L   Potassium 3.7 3.5 - 5.1 mmol/L   Chloride 101 98 - 111 mmol/L   CO2 30 22 - 32 mmol/L   Glucose, Bld 126 (H) 70 - 99 mg/dL   BUN <5 (L) 8 - 23 mg/dL   Creatinine, Ser 0.82 0.61 - 1.24 mg/dL   Calcium 8.1 (L) 8.9 - 10.3 mg/dL   GFR, Estimated >60 >60 mL/min   Anion gap 4 (L) 5 - 15  CBC  Result Value Ref Range   WBC 13.8 (H) 4.0 - 10.5 K/uL   RBC 4.18 (L) 4.22 - 5.81 MIL/uL   Hemoglobin 12.9 (L) 13.0 - 17.0 g/dL   HCT 38.1 (L) 39.0 - 52.0 %  MCV 91.1 80.0 - 100.0 fL   MCH 30.9 26.0 - 34.0 pg   MCHC 33.9 30.0 - 36.0 g/dL   RDW 12.7 11.5 - 15.5 %   Platelets 204 150 - 400 K/uL   nRBC 0.0 0.0 - 0.2 %  Calcium, ionized  Result Value Ref Range   Calcium, Ionized, Serum 4.7 4.5 - 5.6 mg/dL  Strep pneumoniae urinary antigen  Result Value Ref Range   Strep Pneumo Urinary Antigen NEGATIVE NEGATIVE  Legionella Pneumophila Serogp 1 Ur Ag  Result Value Ref Range   L. pneumophila Serogp 1 Ur Ag Negative Negative   Source of Sample URINE, RANDOM   Brain natriuretic peptide  Result Value Ref Range   B Natriuretic Peptide 458.8 (H) 0.0 - 100.0 pg/mL  Magnesium  Result Value Ref Range   Magnesium 1.8 1.7 - 2.4 mg/dL  Magnesium  Result Value Ref Range   Magnesium 2.0 1.7 - 2.4 mg/dL   Renal function panel  Result Value Ref Range   Sodium 134 (L) 135 - 145 mmol/L   Potassium 3.8 3.5 - 5.1 mmol/L   Chloride 98 98 - 111 mmol/L   CO2 29 22 - 32 mmol/L   Glucose, Bld 114 (H) 70 - 99 mg/dL   BUN <5 (L) 8 - 23 mg/dL   Creatinine, Ser 0.73 0.61 - 1.24 mg/dL   Calcium 8.1 (L) 8.9 - 10.3 mg/dL   Phosphorus 3.3 2.5 - 4.6 mg/dL   Albumin 2.9 (L) 3.5 - 5.0 g/dL   GFR, Estimated >60 >60 mL/min   Anion gap 7 5 - 15  Magnesium  Result Value Ref Range   Magnesium 1.8 1.7 - 2.4 mg/dL  CBC  Result Value Ref Range   WBC 14.0 (H) 4.0 - 10.5 K/uL   RBC 4.23 4.22 - 5.81 MIL/uL   Hemoglobin 13.2 13.0 - 17.0 g/dL   HCT 38.4 (L) 39.0 - 52.0 %   MCV 90.8 80.0 - 100.0 fL   MCH 31.2 26.0 - 34.0 pg   MCHC 34.4 30.0 - 36.0 g/dL   RDW 12.4 11.5 - 15.5 %   Platelets 231 150 - 400 K/uL   nRBC 0.0 0.0 - 0.2 %  Vitamin B12  Result Value Ref Range   Vitamin B-12 583 180 - 914 pg/mL  Folate  Result Value Ref Range   Folate 17.6 >5.9 ng/mL  Iron and TIBC  Result Value Ref Range   Iron 26 (L) 45 - 182 ug/dL   TIBC 332 250 - 450 ug/dL   Saturation Ratios 8 (L) 17.9 - 39.5 %   UIBC 306 ug/dL  Ferritin  Result Value Ref Range   Ferritin 84 24 - 336 ng/mL  Reticulocytes  Result Value Ref Range   Retic Ct Pct 1.3 0.4 - 3.1 %   RBC. 4.36 4.22 - 5.81 MIL/uL   Retic Count, Absolute 54.9 19.0 - 186.0 K/uL   Immature Retic Fract 12.6 2.3 - 15.9 %  Brain natriuretic peptide  Result Value Ref Range   B Natriuretic Peptide 411.4 (H) 0.0 - 100.0 pg/mL  Folate  Result Value Ref Range   Folate 18.6 >5.9 ng/mL  Vancomycin, peak  Result Value Ref Range   Vancomycin Pk 29 (L) 30 - 40 ug/mL  Renal function panel  Result Value Ref Range   Sodium 134 (L) 135 - 145 mmol/L   Potassium 3.5 3.5 - 5.1 mmol/L   Chloride 99 98 - 111 mmol/L   CO2 28 22 -  32 mmol/L   Glucose, Bld 96 70 - 99 mg/dL   BUN <5 (L) 8 - 23 mg/dL   Creatinine, Ser 0.75 0.61 - 1.24 mg/dL   Calcium 8.3 (L) 8.9 - 10.3 mg/dL    Phosphorus 3.3 2.5 - 4.6 mg/dL   Albumin 2.9 (L) 3.5 - 5.0 g/dL   GFR, Estimated >60 >60 mL/min   Anion gap 7 5 - 15  Magnesium  Result Value Ref Range   Magnesium 1.9 1.7 - 2.4 mg/dL  CBC  Result Value Ref Range   WBC 11.6 (H) 4.0 - 10.5 K/uL   RBC 4.36 4.22 - 5.81 MIL/uL   Hemoglobin 13.1 13.0 - 17.0 g/dL   HCT 40.3 39.0 - 52.0 %   MCV 92.4 80.0 - 100.0 fL   MCH 30.0 26.0 - 34.0 pg   MCHC 32.5 30.0 - 36.0 g/dL   RDW 12.3 11.5 - 15.5 %   Platelets 226 150 - 400 K/uL   nRBC 0.0 0.0 - 0.2 %  Vancomycin, trough  Result Value Ref Range   Vancomycin Tr 10 (L) 15 - 20 ug/mL  Renal function panel  Result Value Ref Range   Sodium 134 (L) 135 - 145 mmol/L   Potassium 3.6 3.5 - 5.1 mmol/L   Chloride 100 98 - 111 mmol/L   CO2 25 22 - 32 mmol/L   Glucose, Bld 89 70 - 99 mg/dL   BUN <5 (L) 8 - 23 mg/dL   Creatinine, Ser 0.75 0.61 - 1.24 mg/dL   Calcium 8.3 (L) 8.9 - 10.3 mg/dL   Phosphorus 3.1 2.5 - 4.6 mg/dL   Albumin 2.9 (L) 3.5 - 5.0 g/dL   GFR, Estimated >60 >60 mL/min   Anion gap 9 5 - 15  Magnesium  Result Value Ref Range   Magnesium 1.9 1.7 - 2.4 mg/dL  CBC  Result Value Ref Range   WBC 12.3 (H) 4.0 - 10.5 K/uL   RBC 4.27 4.22 - 5.81 MIL/uL   Hemoglobin 13.2 13.0 - 17.0 g/dL   HCT 38.6 (L) 39.0 - 52.0 %   MCV 90.4 80.0 - 100.0 fL   MCH 30.9 26.0 - 34.0 pg   MCHC 34.2 30.0 - 36.0 g/dL   RDW 12.4 11.5 - 15.5 %   Platelets 246 150 - 400 K/uL   nRBC 0.0 0.0 - 0.2 %  Procalcitonin - Baseline  Result Value Ref Range   Procalcitonin <0.10 ng/mL  Magnesium  Result Value Ref Range   Magnesium 2.0 1.7 - 2.4 mg/dL  Renal function panel  Result Value Ref Range   Sodium 134 (L) 135 - 145 mmol/L   Potassium 4.3 3.5 - 5.1 mmol/L   Chloride 101 98 - 111 mmol/L   CO2 26 22 - 32 mmol/L   Glucose, Bld 109 (H) 70 - 99 mg/dL   BUN <5 (L) 8 - 23 mg/dL   Creatinine, Ser 0.78 0.61 - 1.24 mg/dL   Calcium 8.4 (L) 8.9 - 10.3 mg/dL   Phosphorus 3.4 2.5 - 4.6 mg/dL   Albumin 3.0  (L) 3.5 - 5.0 g/dL   GFR, Estimated >60 >60 mL/min   Anion gap 7 5 - 15  CBG monitoring, ED  Result Value Ref Range   Glucose-Capillary 115 (H) 70 - 99 mg/dL

## 2022-10-19 ENCOUNTER — Ambulatory Visit: Payer: 59 | Attending: Internal Medicine

## 2022-10-19 DIAGNOSIS — I480 Paroxysmal atrial fibrillation: Secondary | ICD-10-CM

## 2022-10-19 DIAGNOSIS — J449 Chronic obstructive pulmonary disease, unspecified: Secondary | ICD-10-CM

## 2022-10-19 DIAGNOSIS — I493 Ventricular premature depolarization: Secondary | ICD-10-CM

## 2022-10-19 DIAGNOSIS — I10 Essential (primary) hypertension: Secondary | ICD-10-CM

## 2022-10-20 LAB — CBC
Hematocrit: 44.7 % (ref 37.5–51.0)
Hemoglobin: 14.7 g/dL (ref 13.0–17.7)
MCH: 30.4 pg (ref 26.6–33.0)
MCHC: 32.9 g/dL (ref 31.5–35.7)
MCV: 92 fL (ref 79–97)
Platelets: 175 10*3/uL (ref 150–450)
RBC: 4.84 x10E6/uL (ref 4.14–5.80)
RDW: 13.6 % (ref 11.6–15.4)
WBC: 9.4 10*3/uL (ref 3.4–10.8)

## 2022-10-20 LAB — COMPREHENSIVE METABOLIC PANEL
ALT: 32 IU/L (ref 0–44)
AST: 22 IU/L (ref 0–40)
Albumin/Globulin Ratio: 1.9 (ref 1.2–2.2)
Albumin: 4.3 g/dL (ref 3.9–4.9)
Alkaline Phosphatase: 119 IU/L (ref 44–121)
BUN/Creatinine Ratio: 12 (ref 10–24)
BUN: 10 mg/dL (ref 8–27)
Bilirubin Total: 0.5 mg/dL (ref 0.0–1.2)
CO2: 26 mmol/L (ref 20–29)
Calcium: 9 mg/dL (ref 8.6–10.2)
Chloride: 100 mmol/L (ref 96–106)
Creatinine, Ser: 0.83 mg/dL (ref 0.76–1.27)
Globulin, Total: 2.3 g/dL (ref 1.5–4.5)
Glucose: 130 mg/dL — ABNORMAL HIGH (ref 70–99)
Potassium: 3.9 mmol/L (ref 3.5–5.2)
Sodium: 140 mmol/L (ref 134–144)
Total Protein: 6.6 g/dL (ref 6.0–8.5)
eGFR: 98 mL/min/{1.73_m2} (ref >59)

## 2022-10-20 LAB — TSH: TSH: 0.643 u[IU]/mL (ref 0.450–4.500)

## 2022-10-20 LAB — T4, FREE: Free T4: 2.27 ng/dL — ABNORMAL HIGH (ref 0.82–1.77)

## 2022-10-21 ENCOUNTER — Other Ambulatory Visit: Payer: Self-pay | Admitting: Internal Medicine

## 2022-10-21 DIAGNOSIS — I10 Essential (primary) hypertension: Secondary | ICD-10-CM

## 2022-10-22 ENCOUNTER — Ambulatory Visit (HOSPITAL_BASED_OUTPATIENT_CLINIC_OR_DEPARTMENT_OTHER): Payer: 59 | Admitting: Certified Registered Nurse Anesthetist

## 2022-10-22 ENCOUNTER — Ambulatory Visit (HOSPITAL_COMMUNITY): Payer: 59 | Admitting: Certified Registered Nurse Anesthetist

## 2022-10-22 ENCOUNTER — Ambulatory Visit (HOSPITAL_COMMUNITY)
Admission: RE | Admit: 2022-10-22 | Discharge: 2022-10-22 | Disposition: A | Discharge status: Home / Self Care | Payer: 59 | Attending: Internal Medicine | Admitting: Internal Medicine

## 2022-10-22 ENCOUNTER — Encounter (HOSPITAL_COMMUNITY)
Admission: RE | Disposition: A | Discharge status: Home / Self Care | Payer: Self-pay | Source: Home / Self Care | Attending: Internal Medicine

## 2022-10-22 DIAGNOSIS — Z7901 Long term (current) use of anticoagulants: Secondary | ICD-10-CM | POA: Insufficient documentation

## 2022-10-22 DIAGNOSIS — I10 Essential (primary) hypertension: Secondary | ICD-10-CM

## 2022-10-22 DIAGNOSIS — I480 Paroxysmal atrial fibrillation: Secondary | ICD-10-CM

## 2022-10-22 DIAGNOSIS — F17210 Nicotine dependence, cigarettes, uncomplicated: Secondary | ICD-10-CM

## 2022-10-22 DIAGNOSIS — I493 Ventricular premature depolarization: Secondary | ICD-10-CM | POA: Insufficient documentation

## 2022-10-22 DIAGNOSIS — I5032 Chronic diastolic (congestive) heart failure: Secondary | ICD-10-CM | POA: Insufficient documentation

## 2022-10-22 DIAGNOSIS — I110 Hypertensive heart disease with heart failure: Secondary | ICD-10-CM | POA: Diagnosis not present

## 2022-10-22 DIAGNOSIS — I4891 Unspecified atrial fibrillation: Secondary | ICD-10-CM

## 2022-10-22 DIAGNOSIS — J449 Chronic obstructive pulmonary disease, unspecified: Secondary | ICD-10-CM | POA: Insufficient documentation

## 2022-10-22 DIAGNOSIS — Z8249 Family history of ischemic heart disease and other diseases of the circulatory system: Secondary | ICD-10-CM | POA: Insufficient documentation

## 2022-10-22 DIAGNOSIS — I4819 Other persistent atrial fibrillation: Secondary | ICD-10-CM | POA: Diagnosis present

## 2022-10-22 DIAGNOSIS — Z79899 Other long term (current) drug therapy: Secondary | ICD-10-CM | POA: Insufficient documentation

## 2022-10-22 HISTORY — PX: CARDIOVERSION: SHX1299

## 2022-10-22 SURGERY — CARDIOVERSION
Anesthesia: General

## 2022-10-22 MED ORDER — LIDOCAINE 2% (20 MG/ML) 5 ML SYRINGE
INTRAMUSCULAR | Status: DC | PRN
  Administered 2022-10-22: 60 mg via INTRAVENOUS

## 2022-10-22 MED ORDER — PROPOFOL 10 MG/ML IV BOLUS
INTRAVENOUS | Status: DC | PRN
  Administered 2022-10-22: 70 mg via INTRAVENOUS

## 2022-10-22 MED ORDER — SODIUM CHLORIDE 0.9 % IV SOLN: INTRAVENOUS | Status: DC

## 2022-10-22 NOTE — Anesthesia Preprocedure Evaluation (Addendum)
Anesthesia Evaluation  Patient identified by MRN, date of birth, ID band Patient awake    Reviewed: Allergy & Precautions, NPO status , Patient's Chart, lab work & pertinent test results  History of Anesthesia Complications Negative for: history of anesthetic complications  Airway Mallampati: II  TM Distance: >3 FB Neck ROM: Full    Dental no notable dental hx.    Pulmonary COPD, Current Smoker and Patient abstained from smoking.   Pulmonary exam normal        Cardiovascular hypertension, Pt. on medications Normal cardiovascular exam+ dysrhythmias (on Xarelto) Atrial Fibrillation   TTE 09/12/22: EF 40-45%, global hypokinesis, mild LVH, mild RVE, moderate LAE/RAE, mild MR     Neuro/Psych   Anxiety Depression       GI/Hepatic negative GI ROS, Neg liver ROS,,,  Endo/Other  Hypothyroidism    Renal/GU negative Renal ROS  negative genitourinary   Musculoskeletal  (+) Arthritis ,    Abdominal   Peds  Hematology negative hematology ROS (+)   Anesthesia Other Findings Day of surgery medications reviewed with patient.  Reproductive/Obstetrics negative OB ROS                              Anesthesia Physical Anesthesia Plan  ASA: 3  Anesthesia Plan: General   Post-op Pain Management: Minimal or no pain anticipated   Induction: Intravenous  PONV Risk Score and Plan: Treatment may vary due to age or medical condition and Propofol infusion  Airway Management Planned: Mask  Additional Equipment: None  Intra-op Plan:   Post-operative Plan:   Informed Consent: I have reviewed the patients History and Physical, chart, labs and discussed the procedure including the risks, benefits and alternatives for the proposed anesthesia with the patient or authorized representative who has indicated his/her understanding and acceptance.       Plan Discussed with: CRNA  Anesthesia Plan Comments:           Anesthesia Quick Evaluation

## 2022-10-22 NOTE — Anesthesia Postprocedure Evaluation (Signed)
Anesthesia Post Note  Patient: Jesse Soter Doig [name]  Procedure(s) Performed: CARDIOVERSION     Patient location during evaluation: PACU Anesthesia Type: General Level of consciousness: awake and alert Pain management: pain level controlled Vital Signs Assessment: post-procedure vital signs reviewed and stable Respiratory status: spontaneous breathing, nonlabored ventilation and respiratory function stable Cardiovascular status: blood pressure returned to baseline Postop Assessment: no apparent nausea or vomiting Anesthetic complications: no   No notable events documented.  Last Vitals:  Vitals:   10/22/22 0825 10/22/22 0831  BP: 133/89 123/84  Pulse: 60 60  Resp: 18 18  Temp: (!) 35.9 C   SpO2: 99% 95%    Last Pain:  Vitals:   10/22/22 0825  TempSrc: Temporal  PainSc: Asleep                 Marthenia Rolling

## 2022-10-22 NOTE — Interval H&P Note (Signed)
History and Physical Interval Note:  10/22/2022 8:10 AM  Jesse Europe [name]  has presented today for surgery, with the diagnosis of AFIB.  The various methods of treatment have been discussed with the patient and family. After consideration of risks, benefits and other options for treatment, the patient has consented to  Procedure(s): CARDIOVERSION (N/A) as a surgical intervention.  The patient's history has been reviewed, patient examined, no change in status, stable for surgery.  I have reviewed the patient's chart and labs.  Questions were answered to the patient's satisfaction.     Pixie Casino

## 2022-10-22 NOTE — CV Procedure (Signed)
    CARDIOVERSION NOTE  Procedure: Electrical Cardioversion Indications:  Atrial Fibrillation  Procedure Details:  Consent: Risks of procedure as well as the alternatives and risks of each were explained to the (patient/caregiver).  Consent for procedure obtained.  Time Out: Verified patient identification, verified procedure, site/side was marked, verified correct patient position, special equipment/implants available, medications/allergies/relevent history reviewed, required imaging and test results available.  Performed  Patient placed on cardiac monitor, pulse oximetry, supplemental oxygen as necessary.  Sedation given:  propofol per anesthesia Pacer pads placed anterior and posterior chest.  Cardioverted 1 time(s).  Cardioverted at 200J biphasic.  Impression: Findings: Post procedure EKG shows: NSR Complications: None Patient did tolerate procedure well.  Plan: Successful DCCV with a single 200J biphasic shock.  Time Spent Directly with the Patient:  30 minutes   Pixie Casino, MD, [facility] [place] Director of the [facility] Lipid Disorders &  Cardiovascular Risk Reduction Clinic Diplomate of the American Board of Clinical Lipidology Attending Cardiologist  Direct Dial: [phone]  Fax: [phone]  Website:  [email or web address]  Jesse Suarez  10/22/2022, 8:22 AM

## 2022-10-22 NOTE — Anesthesia Procedure Notes (Signed)
Procedure Name: General with mask airway Date/Time: 10/22/2022 8:17 AM  Performed by: Lowella Dell, CRNAPre-anesthesia Checklist: Patient identified, Emergency Drugs available, Suction available, Patient being monitored and Timeout performed Patient Re-evaluated:Patient Re-evaluated prior to induction Oxygen Delivery Method: Ambu bag Preoxygenation: Pre-oxygenation with 100% oxygen Induction Type: IV induction Ventilation: Mask ventilation without difficulty Placement Confirmation: positive ETCO2 Dental Injury: Teeth and Oropharynx as per pre-operative assessment

## 2022-10-22 NOTE — Transfer of Care (Signed)
Immediate Anesthesia Transfer of Care Note  Patient: Jesse Pigeon Loeffelholz [name]  Procedure(s) Performed: CARDIOVERSION  Patient Location: PACU and Endoscopy Unit  Anesthesia Type:General  Level of Consciousness: drowsy and patient cooperative  Airway & Oxygen Therapy: Patient Spontanous Breathing and Patient connected to nasal cannula oxygen  Post-op Assessment: Report given to RN and Post -op Vital signs reviewed and stable  Post vital signs: Reviewed and stable  Last Vitals:  Vitals Value Taken Time  BP 131/85   Temp    Pulse 65   Resp 18   SpO2 96     Last Pain:  Vitals:   10/22/22 0733  TempSrc: Temporal  PainSc: 0-No pain         Complications: No notable events documented.

## 2022-10-22 NOTE — Discharge Instructions (Signed)

## 2022-10-26 ENCOUNTER — Encounter (HOSPITAL_COMMUNITY): Payer: Self-pay | Admitting: Internal Medicine

## 2022-10-28 ENCOUNTER — Telehealth: Payer: Self-pay

## 2022-10-28 NOTE — Telephone Encounter (Signed)
   Pre-operative Risk Assessment    Patient Name: Jesse STAFFA [name]  DOB: 1960-11-12 MRN: [id number]      Request for Surgical Clearance    Procedure:   Cataract Extraction By PE, IOL-Right   Date of Surgery:  Clearance TBD                                 Surgeon:  Adela Ports, MD Surgeon's Group or Practice Name:  [facility] Phone number:  [phone] Fax number:  [phone]   Type of Clearance Requested:   - Medical  ** per request he will not need to hold anticoagulation for cataract surgery**   Type of Anesthesia:   IV Sedation   Additional requests/questions:   N/A  SignedJamelle Haring   10/28/2022, 4:48 PM \

## 2022-10-29 ENCOUNTER — Ambulatory Visit: Payer: 59 | Attending: Internal Medicine | Admitting: Internal Medicine

## 2022-10-29 ENCOUNTER — Telehealth: Payer: Self-pay | Admitting: Internal Medicine

## 2022-10-29 ENCOUNTER — Encounter: Payer: Self-pay | Admitting: Internal Medicine

## 2022-10-29 VITALS — BP 168/108 | HR 74 | Ht 69.0 in | Wt 205.2 lb

## 2022-10-29 DIAGNOSIS — I4819 Other persistent atrial fibrillation: Secondary | ICD-10-CM | POA: Diagnosis not present

## 2022-10-29 NOTE — Telephone Encounter (Signed)
Type of form received: Quit Services  Additional comments: None  Received by: Patient  Form should be Faxed to: n/a  Form should be mailed to:  n/a  Is patient requesting call for pickup: [phone]   Form placed:  Dr file  Koleen Nimrod charge sheet. yes  Individual made aware of 3-5 business day turn around (Y/N)? yes

## 2022-10-29 NOTE — Telephone Encounter (Signed)
Received these forms today.

## 2022-10-29 NOTE — Progress Notes (Signed)
HPI [name] returns today for followup. He is a pleasant middle aged man with a remote h/o peristent atrial fib in the setting of thyrotoxicosis. He has been euthyroid for many years but has developed atrial fib and has been placed on amiodarone and underwent DCCV and has had ERAF He feels better than he did but new that something was not quiet right over the last few days. He has not had syncope. He remains on amiodarone 200 mg daily.   Allergies  Allergen Reactions   Sulfa Drugs Cross Reactors Hives   Codeine Other (See Comments)    Made him very jittery    Hydrochlorothiazide Other (See Comments)    Hyponatremia     Current Outpatient Medications  Medication Sig Dispense Refill   amiodarone (PACERONE) 200 MG tablet Take 1 tablet (200 mg total) by mouth daily. (Patient taking differently: Take 200-400 mg by mouth See admin instructions. 400 mg twice a day for five day.  200 mg one a day for 25 days) 90 tablet 3   celecoxib (CELEBREX) 100 MG capsule Take 1 capsule (100 mg total) by mouth 2 (two) times daily. 180 capsule 3   Cholecalciferol (VITAMIN D-3) 125 MCG (5000 UT) TABS Take 5,000 Units by mouth daily. 90 tablet 3   cloNIDine (CATAPRES - DOSED IN MG/24 HR) 0.1 mg/24hr patch Place 1 patch (0.1 mg total) onto the skin once a week. 12 patch 3   cyclobenzaprine (FLEXERIL) 10 MG tablet Take 1 tablet (10 mg total) by mouth 3 (three) times daily as needed for muscle spasms. 30 tablet 0   FLUoxetine (PROZAC) 20 MG tablet Take 1 tablet (20 mg total) by mouth daily. 30 tablet 0   fluticasone (FLONASE) 50 MCG/ACT nasal spray Place 2 sprays into both nostrils daily as needed for allergies. 9.9 g 11   levothyroxine (SYNTHROID) 200 MCG tablet Take 1 tablet (200 mcg total) by mouth daily. Take as recommended by endocrine.  200 mg daily except for Sunday, take 100 mg. (Patient taking differently: Take 100-200 mcg by mouth See admin instructions. Take as recommended by endocrine.  200 mg  daily except for Sunday, take 100 mg.) 90 tablet 3   losartan (COZAAR) 50 MG tablet Take 1 tablet (50 mg total) by mouth daily. 90 tablet 1   metoprolol tartrate (LOPRESSOR) 50 MG tablet Take 1 tablet (50 mg total) by mouth 2 (two) times daily. 60 tablet 2   Multiple Vitamins-Minerals (MULTIVITAMIN WITH MINERALS) tablet Take 1 tablet by mouth daily.     nicotine (NICODERM CQ - DOSED IN MG/24 HOURS) 21 mg/24hr patch Place 1 patch (21 mg total) onto the skin daily. 28 patch 11   rivaroxaban (XARELTO) 20 MG TABS tablet Take 1 tablet (20 mg total) by mouth daily with supper. 60 tablet 5   rosuvastatin (CRESTOR) 40 MG tablet Take 1 tablet (40 mg total) by mouth daily. Replaces 20 mg dose 90 tablet 3   tamsulosin (FLOMAX) 0.4 MG CAPS capsule Take 1 capsule (0.4 mg total) by mouth daily. 90 capsule 3   No current facility-administered medications for this visit.     Past Medical History:  Diagnosis Date   Acute hypoxemic respiratory failure ([place]) 09/18/2022   Allergy    Anxiety    Atrial fibrillation ([facility])    Atrial fibrillation with RVR ([place]) 09/12/2022   Bell palsy    Depression    Diastolic heart failure    HAP (hospital-acquired pneumonia) 09/17/2022  Hypercholesterolemia    Hypertension    Pneumonia    Thyroid disease     ROS:   All systems reviewed and negative except as noted in the HPI.   Past Surgical History:  Procedure Laterality Date   CARDIOVERSION N/A 09/15/2022   Procedure: CARDIOVERSION;  Surgeon: Werner Lean, MD;  Location: [facility] ENDOSCOPY;  Service: Cardiovascular;  Laterality: N/A;   CARDIOVERSION N/A 10/22/2022   Procedure: CARDIOVERSION;  Surgeon: Pixie Casino, MD;  Location: [facility] ENDOSCOPY;  Service: Cardiovascular;  Laterality: N/A;   CHOLECYSTECTOMY     HERNIA REPAIR     KNEE SURGERY     VASECTOMY       Family History  Problem Relation Age of Onset   Heart disease Mother    Heart attack Mother    Other Mother        thyroid problems    Kidney cancer Mother    Cirrhosis Father    Other Father        thyroid problems   Alcohol abuse Father    Hypertension Father    Mental illness Brother    Parkinsonism Brother      Social History   Socioeconomic History   Marital status: Married    Spouse name: Not on file   Number of children: 1   Years of education: Not on file   Highest education level: Not on file  Occupational History   Not on file  Tobacco Use   Smoking status: Every Day    Packs/day: 0.50    Years: 40.00    Total pack years: 20.00    Types: Cigarettes    Start date: 09/14/1978   Smokeless tobacco: Never  Vaping Use   Vaping Use: Never used  Substance and Sexual Activity   Alcohol use: Yes    Alcohol/week: 0.0 standard drinks of alcohol    Comment: rarely   Drug use: No   Sexual activity: Never    Birth control/protection: None  Other Topics Concern   Not on file  Social History Narrative   Marital status: married x 30+ years      Children:  2 children;(38,37); 3+3 grandchild      Lives:  With wife, son      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      Tobacco: 1 ppd x 30 years      Alcohol: rare     Regular exercise: walking daily   Caffeine use: daily      Social Determinants of Health   Financial Resource Strain: Not on file  Food Insecurity: No Food Insecurity (09/13/2022)   Hunger Vital Sign    Worried About Running Out of Food in the Last Year: Never true    Ran Out of Food in the Last Year: Never true  Transportation Needs: No Transportation Needs (09/13/2022)   PRAPARE - Hydrologist (Medical): No    Lack of Transportation (Non-Medical): No  Physical Activity: Not on file  Stress: Not on file  Social Connections: Not on file  Intimate Partner Violence: Not At Risk (09/13/2022)   Humiliation, Afraid, Rape, and Kick questionnaire    Fear of Current or Ex-Partner: No    Emotionally Abused: No    Physically Abused: No     Sexually Abused: No     BP (!) 168/108   Pulse 74   Ht 5' 9"$  (1.753 m)   Wt 205 lb 3.2 oz (93.1  kg)   SpO2 96%   BMI 30.30 kg/m   Physical Exam:  Well appearing NAD HEENT: Unremarkable Neck:  No JVD, no thyromegally Lymphatics:  No adenopathy Back:  No CVA tenderness Lungs:  Clear with no wheezes HEART:  IRegular rate rhythm, no murmurs, no rubs, no clicks Abd:  soft, positive bowel sounds, no organomegally, no rebound, no guarding Ext:  2 plus pulses, no edema, no cyanosis, no clubbing Skin:  No rashes no nodules Neuro:  CN II through XII intact, motor grossly intact  EKG - atrial fib with a controlled VR  Assess/Plan: Persistent atrial fib - He will continue amiodarone and I will refer him to consider PVI with Dr. Myles Gip.  HTN - his bp is up but he notes that his bp is always much better at home.   Carleene Overlie ,MD

## 2022-10-29 NOTE — Patient Instructions (Signed)
Medication Instructions:  Your physician recommends that you continue on your current medications as directed. Please refer to the Current Medication list given to you today.  *If you need a refill on your cardiac medications before your next appointment, please call your pharmacy*   Lab Work: None ordered   Testing/Procedures: None ordered   Follow-Up: At [facility], you and your health needs are our priority.  As part of our continuing mission to provide you with exceptional heart care, we have created designated Provider Care Teams.  These Care Teams include your primary Cardiologist (physician) and Advanced Practice Providers (APPs -  Physician Assistants and Nurse Practitioners) who all work together to provide you with the care you need, when you need it.   You have been referred to Dr. Myles Gip to discuss afib ablation   Your next appointment:   6 month(s)  -- this could change depending on if you move forward with ablation  The format for your next appointment:   In Person  Provider:   Cristopher Peru, MD{  Thank you for choosing [facility]!!   [phone]  Other Instructions  Cardiac Ablation Cardiac ablation is a procedure to destroy (ablate) heart tissue that is sending bad signals. These bad signals cause the heart to beat very fast or in a way that is not normal. Destroying some tissues can help make the heart rhythm normal. Tell your doctor about: Any allergies you have. All medicines you are taking. These include vitamins, herbs, eye drops, creams, and over-the-counter medicines. Any problems you or family members have had with anesthesia. Any bleeding problems you have. Any surgeries you have had. Any medical conditions you have. Whether you are pregnant or may be pregnant. What are the risks? Your doctor will talk with you about risks. These may include: Infection. Bruising and bleeding. Stroke or blood clots. Damage to nearby areas of your  body. Allergies to medicines or dyes. Needing a pacemaker if the heart gets damaged. A pacemaker helps the heart beat normally. The procedure not working. What happens before the procedure? Medicines Ask your doctor about changing or stopping: Your normal medicines. Vitamins, herbs, and supplements. Over-the-counter medicines. Do not take aspirin or ibuprofen unless you are told to. General instructions Follow instructions from your doctor about what you may eat and drink. If you will be going home right after the procedure, plan to have a responsible adult: Take you home from the hospital or clinic. You will not be allowed to drive. Care for you for the time you are told. Ask your doctor what steps will be taken to prevent the spread of germs. What happens during the procedure?  An IV tube will be put into one of your veins. You may be given: A sedative. This helps you relax. Anesthesia. This will: Numb certain areas of your body. The skin on your neck or groin will be numbed. A cut (incision) will be made in your neck or groin. A needle will be put through the cut and into a large vein. The small, thin tube (catheter) will be put into the needle. The tube will be moved to your heart. A type of X-ray (fluoroscopy) will be used to help guide the tube. It will also show constant images of the heart on a screen. Dye may be put through the tube. This helps your doctor see your heart. An electric current will be sent from the tube to destroy heart tissue in certain areas. The tube will be  taken out. Pressure will be held on your cut. This helps stop bleeding. A bandage (dressing) will be put over your cut. The procedure may vary among doctors and hospitals. What happens after the procedure? You will be monitored until you leave the hospital or clinic. This includes checking your blood pressure, heart rate and rhythm, breathing rate, and blood oxygen level. Your cut will be checked  for bleeding. You will need to lie still for a few hours. If your groin was used, you will need to keep your leg straight for a few hours after the small, thin tube is removed. This information is not intended to replace advice given to you by your health care provider. Make sure you discuss any questions you have with your health care provider. Document Revised: 02/17/2022 Document Reviewed: 02/17/2022 Elsevier Patient Education  [employer].

## 2022-10-29 NOTE — Telephone Encounter (Signed)
   Patient Name: Jesse RHODES [name]  DOB: Feb 07, 1961 MRN: [id number]  Primary Cardiologist: None  Chart reviewed as part of pre-operative protocol coverage. Cataract extractions are recognized in guidelines as low risk surgeries that do not typically require specific preoperative testing or holding of blood thinner therapy. Therefore, given past medical history and time since last visit, based on ACC/AHA guidelines, SALATHIEL FERRARA [name] would be at acceptable risk for the planned procedure without further cardiovascular testing.   Request is not requiring anticoagulation hold but of note, patient underwent cardioversion on 10/22/2022 making him a high risk for clot. Therefore, anticoagulation cannot be interrupted for four weeks.   I will route this recommendation to the requesting party via Epic fax function and remove from pre-op pool.  Please call with questions.  Mayra Reel, NP 10/29/2022, 8:37 AM

## 2022-10-30 ENCOUNTER — Other Ambulatory Visit: Payer: Self-pay | Admitting: Internal Medicine

## 2022-10-30 DIAGNOSIS — F325 Major depressive disorder, single episode, in full remission: Secondary | ICD-10-CM

## 2022-11-03 ENCOUNTER — Ambulatory Visit: Payer: 59 | Admitting: Internal Medicine

## 2022-11-03 NOTE — Telephone Encounter (Signed)
Prescription already sent in by Dr.Morrison.

## 2022-11-04 ENCOUNTER — Other Ambulatory Visit: Payer: Self-pay | Admitting: Internal Medicine

## 2022-11-04 DIAGNOSIS — I10 Essential (primary) hypertension: Secondary | ICD-10-CM

## 2022-11-05 ENCOUNTER — Ambulatory Visit: Payer: 59 | Admitting: Internal Medicine

## 2022-11-05 ENCOUNTER — Encounter: Payer: Self-pay | Admitting: Internal Medicine

## 2022-11-05 VITALS — BP 136/90 | HR 77 | Temp 97.6°F | Ht 69.0 in | Wt 210.2 lb

## 2022-11-05 DIAGNOSIS — N529 Male erectile dysfunction, unspecified: Secondary | ICD-10-CM | POA: Diagnosis not present

## 2022-11-05 DIAGNOSIS — M5136 Other intervertebral disc degeneration, lumbar region: Secondary | ICD-10-CM

## 2022-11-05 DIAGNOSIS — J189 Pneumonia, unspecified organism: Secondary | ICD-10-CM | POA: Diagnosis not present

## 2022-11-05 DIAGNOSIS — Z01818 Encounter for other preprocedural examination: Secondary | ICD-10-CM

## 2022-11-05 MED ORDER — VARDENAFIL HCL 20 MG PO TABS: 20.0000 mg | ORAL_TABLET | Freq: Every day | ORAL | 0 refills | Status: DC | PRN

## 2022-11-05 MED ORDER — CYCLOBENZAPRINE HCL 10 MG PO TABS: 10.0000 mg | ORAL_TABLET | Freq: Three times a day (TID) | ORAL | 2 refills | Status: DC | PRN

## 2022-11-05 MED ORDER — VARDENAFIL HCL 20 MG PO TABS: 20.0000 mg | ORAL_TABLET | Freq: Every day | ORAL | 11 refills | Status: DC | PRN

## 2022-11-05 NOTE — Progress Notes (Signed)
[facility]: [id number]   Routine Medical Office Visit  Patient:  Jesse EICHENAUER [name]      Age: 62 y.o.       Sex:  male  Date:   11/06/2022  PCP:    Loralee Pacas, [place] Provider: Loralee Pacas, MD   Problem Focused Charting:   Medical Decision Making per Assessment/Plan   Cyree was seen today for 1 month follow-up and surgical clearance form completed.  Erectile dysfunction, unspecified erectile dysfunction type -     Vardenafil HCl; Take 1 tablet (20 mg total) by mouth daily as needed for erectile dysfunction.  Dispense: 30 tablet; Refill: 11 -     Vardenafil HCl; Take 1 tablet (20 mg total) by mouth daily as needed for erectile dysfunction.  Dispense: 10 tablet; Refill: 0  Lumbar degenerative disc disease -     Cyclobenzaprine HCl; Take 1 tablet (10 mg total) by mouth 3 (three) times daily as needed for muscle spasms.  Dispense: 90 tablet; Refill: 2  Pneumonia of right lower lobe due to infectious organism -     DG Chest 2 View; Future  Preoperative evaluation to rule out surgical contraindication    Completed surgical preoperative clearance paperwork and nicotine replacement therapy paperwork today - see scanned copies He needed the surgical preoperative clearance due to recent hospital acquired pneumonia (HAP)- for which I recommended he complete a preoperative XR due to I still hear reduced lung sounds where the pneumonia was... Although he reports symptom(s) all gone and cardiology has cleared.   Subjective - Clinical Presentation:   Jesse VENIER [name] is a 62 y.o. male  Patient Active Problem List   Diagnosis Date Noted   Lumbar degenerative disc disease 10/08/2022   Bladder dysfunction 10/08/2022   Seasonal allergies 10/08/2022   Major depressive disorder, single episode, in remission ([place]) 10/08/2022   Prolonged QT interval 09/20/2022   Tobacco use disorder 09/19/2022   Mood disorder ([place]) 09/19/2022   Iron  deficiency anemia 09/18/2022   Pneumonia of right lower lobe due to infectious organism 09/17/2022   Vitamin D deficiency 03/31/2022   COPD GOLD 0/ emphysema on CT  03/15/2019   PVC's (premature ventricular contractions) 01/06/2017   Bone neoplasm 02/25/2015   Cigarette smoker 08/07/2013   Depression with anxiety 02/20/2012   ED (erectile dysfunction) 02/20/2012   Hx of Bell's palsy 02/20/2012   Amputated toe ([place]) 02/20/2012   Paroxysmal atrial fibrillation with RVR ([place]) 01/15/2011   Hypothyroidism following radioiodine therapy 01/15/2011   Essential hypertension 01/15/2011   Dyslipidemia 01/15/2011   Past Medical History:  Diagnosis Date   Acute hypoxemic respiratory failure ([place]) 09/18/2022   Allergy    Anxiety    Atrial fibrillation ([facility])    Atrial fibrillation with RVR ([place]) 09/12/2022   Bell palsy    Depression    Diastolic heart failure    HAP (hospital-acquired pneumonia) 09/17/2022   Hypercholesterolemia    Hypertension    Pneumonia    Thyroid disease     Outpatient Medications Prior to Visit  Medication Sig   amiodarone (PACERONE) 200 MG tablet Take 1 tablet (200 mg total) by mouth daily. (Patient taking differently: Take 200-400 mg by mouth See admin instructions. 400 mg twice a day for five day.  200 mg one a day for 25 days)   celecoxib (CELEBREX) 100 MG capsule Take 1 capsule (100 mg total) by mouth 2 (two) times daily.   Cholecalciferol (VITAMIN  D-3) 125 MCG (5000 UT) TABS Take 5,000 Units by mouth daily.   cloNIDine (CATAPRES - DOSED IN MG/24 HR) 0.1 mg/24hr patch Place 1 patch (0.1 mg total) onto the skin once a week.   FLUoxetine (PROZAC) 20 MG tablet TAKE 1 TABLET BY MOUTH EVERY DAY   fluticasone (FLONASE) 50 MCG/ACT nasal spray Place 2 sprays into both nostrils daily as needed for allergies.   levothyroxine (SYNTHROID) 200 MCG tablet Take 1 tablet (200 mcg total) by mouth daily. Take as recommended by endocrine.  200 mg daily except for Sunday, take 100  mg. (Patient taking differently: Take 100-200 mcg by mouth See admin instructions. Take as recommended by endocrine.  200 mg daily except for Sunday, take 100 mg.)   losartan (COZAAR) 50 MG tablet Take 1 tablet (50 mg total) by mouth daily.   metoprolol tartrate (LOPRESSOR) 50 MG tablet Take 1 tablet (50 mg total) by mouth 2 (two) times daily.   Multiple Vitamins-Minerals (MULTIVITAMIN WITH MINERALS) tablet Take 1 tablet by mouth daily.   nicotine (NICODERM CQ - DOSED IN MG/24 HOURS) 21 mg/24hr patch Place 1 patch (21 mg total) onto the skin daily.   rivaroxaban (XARELTO) 20 MG TABS tablet Take 1 tablet (20 mg total) by mouth daily with supper.   rosuvastatin (CRESTOR) 40 MG tablet Take 1 tablet (40 mg total) by mouth daily. Replaces 20 mg dose   tamsulosin (FLOMAX) 0.4 MG CAPS capsule Take 1 capsule (0.4 mg total) by mouth daily.   [DISCONTINUED] cyclobenzaprine (FLEXERIL) 10 MG tablet Take 1 tablet (10 mg total) by mouth 3 (three) times daily as needed for muscle spasms.   No facility-administered medications prior to visit.    Chief Complaint  Patient presents with   1 month follow-up   Surgical clearance form completed    HPI  History Sharee Pimple returns today needing a surgical clearance form completed a tobacco cessation form completed and he would also like medication to help him with ED and Viagra did not work before but he is interested in [place] and also spasm pills for his back spasms Heart rate in 80's , got cleared from cardiology Dr.. taylor needs cataract surgery clearance.  Reason for me is he has pneumonia in hospital December/January. He reports he is able to walk up and down staircase and work in garden.  Denies cough or shortness. Denies history of immune compromise.           Objective:  Physical Exam Cardiovascular:     Rate and Rhythm: Normal rate and regular rhythm.  Pulmonary:     Effort: Pulmonary effort is normal.     Breath sounds: Normal breath sounds. Decreased  air movement (rll) present.     BP (!) 136/90 (BP Location: Right Arm, Patient Position: Sitting)   Pulse 77   Temp 97.6 F (36.4 C) (Temporal)   Ht '5\' 9"'$  (1.753 m)   Wt 210 lb 3.2 oz (95.3 kg)   SpO2 95%   BMI 31.04 kg/m  Obese  by BMI criteria but truncal adiposity (waist circumference or caliper) should be used instead. Wt Readings from Last 10 Encounters:  11/05/22 210 lb 3.2 oz (95.3 kg)  10/29/22 205 lb 3.2 oz (93.1 kg)  10/22/22 197 lb (89.4 kg)  10/08/22 199 lb 3.2 oz (90.4 kg)  10/07/22 197 lb 3.2 oz (89.4 kg)  09/23/22 188 lb 9.6 oz (85.5 kg)  09/11/22 193 lb (87.5 kg)  09/02/22 193 lb (87.5 kg)  08/14/22 195 lb (  88.5 kg)  07/08/22 194 lb 11.2 oz (88.3 kg)   Vital signs reviewed.  Nursing notes reviewed. Weight trend reviewed. General Appearance:  Well developed, well nourished male in no acute distress.   Normal work of breathing at rest Neurological:  Awake, alert,  No obvious focal neurological deficits or cognitive impairments Psychiatric:  Appropriate mood, pleasant demeanor Problem-specific findings:    Results Reviewed: No results found for any visits on 11/05/22.  Recent Results (from the past 2160 hour(s))  Basic metabolic panel     Status: Abnormal   Collection Time: 08/31/22  9:23 AM  Result Value Ref Range   Sodium 138 135 - 145 mEq/L   Potassium 4.2 3.5 - 5.1 mEq/L   Chloride 100 96 - 112 mEq/L   CO2 29 19 - 32 mEq/L   Glucose, Bld 110 (H) 70 - 99 mg/dL   BUN 13 6 - 23 mg/dL   Creatinine, Ser 0.95 0.40 - 1.50 mg/dL   GFR 85.06 >60.00 mL/min    Comment: Calculated using the CKD-EPI Creatinine Equation (2021)   Calcium 9.6 8.4 - 10.5 mg/dL  T4, free     Status: None   Collection Time: 08/31/22  9:23 AM  Result Value Ref Range   Free T4 1.39 0.60 - 1.60 ng/dL    Comment: Specimens from patients who are undergoing biotin therapy and /or ingesting biotin supplements may contain high levels of biotin.  The higher biotin concentration in these  specimens interferes with this Free T4 assay.  Specimens that contain high levels  of biotin may cause false high results for this Free T4 assay.  Please interpret results in light of the total clinical presentation of the patient.    TSH     Status: Abnormal   Collection Time: 08/31/22  9:23 AM  Result Value Ref Range   TSH 0.26 (L) 0.35 - 5.50 uIU/mL  Basic metabolic panel     Status: Abnormal   Collection Time: 09/11/22  1:35 PM  Result Value Ref Range   Sodium 137 135 - 145 mmol/L   Potassium 4.3 3.5 - 5.1 mmol/L   Chloride 101 98 - 111 mmol/L   CO2 28 22 - 32 mmol/L   Glucose, Bld 99 70 - 99 mg/dL    Comment: Glucose reference range applies only to samples taken after fasting for at least 8 hours.   BUN 12 8 - 23 mg/dL   Creatinine, Ser 0.95 0.61 - 1.24 mg/dL   Calcium 8.8 (L) 8.9 - 10.3 mg/dL   GFR, Estimated >60 >60 mL/min    Comment: (NOTE) Calculated using the CKD-EPI Creatinine Equation (2021)    Anion gap 8 5 - 15    Comment: Performed at [facility] [address] [place] [address]  CBC     Status: None   Collection Time: 09/11/22  1:35 PM  Result Value Ref Range   WBC 10.4 4.0 - 10.5 K/uL   RBC 4.77 4.22 - 5.81 MIL/uL   Hemoglobin 14.7 13.0 - 17.0 g/dL   HCT 44.7 39.0 - 52.0 %   MCV 93.7 80.0 - 100.0 fL   MCH 30.8 26.0 - 34.0 pg   MCHC 32.9 30.0 - 36.0 g/dL   RDW 13.0 11.5 - 15.5 %   Platelets 188 150 - 400 K/uL   nRBC 0.0 0.0 - 0.2 %    Comment: Performed at [facility], [place] [address] [place], [place] [address]  Troponin I (  High Sensitivity)     Status: None   Collection Time: 09/11/22  1:35 PM  Result Value Ref Range   Troponin I (High Sensitivity) 5 <18 ng/L    Comment: (NOTE) Elevated high sensitivity troponin I (hsTnI) values and significant  changes across serial measurements may suggest ACS but many other  chronic and acute conditions are known to elevate hsTnI results.  Refer to the "Links" section for chest pain  algorithms and additional  guidance. Performed at [facility], [place] [address], [place], [place] [address]   Troponin I (High Sensitivity)     Status: None   Collection Time: 09/11/22  3:29 PM  Result Value Ref Range   Troponin I (High Sensitivity) 5 <18 ng/L    Comment: (NOTE) Elevated high sensitivity troponin I (hsTnI) values and significant  changes across serial measurements may suggest ACS but many other  chronic and acute conditions are known to elevate hsTnI results.  Refer to the "Links" section for chest pain algorithms and additional  guidance. Performed at [facility], [place] [address] [place], [place] [address]   Resp panel by RT-PCR (RSV, Flu A&B, Covid) Anterior Nasal Swab     Status: None   Collection Time: 09/11/22  4:32 PM   Specimen: Anterior Nasal Swab  Result Value Ref Range   SARS Coronavirus 2 by RT PCR NEGATIVE NEGATIVE    Comment: (NOTE) SARS-CoV-2 target nucleic acids are NOT DETECTED.  The SARS-CoV-2 RNA is generally detectable in upper respiratory specimens during the acute phase of infection. The lowest concentration of SARS-CoV-2 viral copies this assay can detect is 138 copies/mL. A negative result does not preclude SARS-Cov-2 infection and should not be used as the sole basis for treatment or other patient management decisions. A negative result may occur with  improper specimen collection/handling, submission of specimen other than nasopharyngeal swab, presence of viral mutation(s) within the areas targeted by this assay, and inadequate number of viral copies(<138 copies/mL). A negative result must be combined with clinical observations, patient history, and epidemiological information. The expected result is Negative.  Fact Sheet for Patients:  EntrepreneurPulse.com.au  Fact Sheet for Healthcare Providers:  IncredibleEmployment.be  This test is no t yet approved or cleared by the [place] FDA and  has been authorized for detection and/or diagnosis of SARS-CoV-2 by FDA under an Emergency Use Authorization (EUA). This EUA will remain  in effect (meaning this test can be used) for the duration of the COVID-19 declaration under Section 564(b)(1) of the Act, 21 U.S.C.section 360bbb-3(b)(1), unless the authorization is terminated  or revoked sooner.       Influenza A by PCR NEGATIVE NEGATIVE   Influenza B by PCR NEGATIVE NEGATIVE    Comment: (NOTE) The Xpert Xpress SARS-CoV-2/FLU/RSV plus assay is intended as an aid in the diagnosis of influenza from Nasopharyngeal swab specimens and should not be used as a sole basis for treatment. Nasal washings and aspirates are unacceptable for Xpert Xpress SARS-CoV-2/FLU/RSV testing.  Fact Sheet for Patients: EntrepreneurPulse.com.au  Fact Sheet for Healthcare Providers: IncredibleEmployment.be  This test is not yet approved or cleared by the [place] FDA and has been authorized for detection and/or diagnosis of SARS-CoV-2 by FDA under an Emergency Use Authorization (EUA). This EUA will remain in effect (meaning this test can be used) for the duration of the COVID-19 declaration under Section 564(b)(1) of the Act, 21 U.S.C. section 360bbb-3(b)(1), unless the authorization is terminated or revoked.  Resp Syncytial Virus by PCR NEGATIVE NEGATIVE    Comment: (NOTE) Fact Sheet for Patients: EntrepreneurPulse.com.au  Fact Sheet for Healthcare Providers: IncredibleEmployment.be  This test is not yet approved or cleared by the [place] FDA and has been authorized for detection and/or diagnosis of SARS-CoV-2 by FDA under an Emergency Use Authorization (EUA). This EUA will remain in effect (meaning this test can be used) for the duration of the COVID-19 declaration under Section 564(b)(1) of the Act, 21 U.S.C. section 360bbb-3(b)(1), unless  the authorization is terminated or revoked.  Performed at [facility], [place] [address], [place], [place] [address]   Brain natriuretic peptide     Status: Abnormal   Collection Time: 09/11/22  4:32 PM  Result Value Ref Range   B Natriuretic Peptide 225.9 (H) 0.0 - 100.0 pg/mL    Comment: Performed at [facility] [address], [place], [place] [address]  CBC     Status: Abnormal   Collection Time: 09/12/22  1:42 AM  Result Value Ref Range   WBC 10.9 (H) 4.0 - 10.5 K/uL   RBC 4.65 4.22 - 5.81 MIL/uL   Hemoglobin 14.2 13.0 - 17.0 g/dL   HCT 43.7 39.0 - 52.0 %   MCV 94.0 80.0 - 100.0 fL   MCH 30.5 26.0 - 34.0 pg   MCHC 32.5 30.0 - 36.0 g/dL   RDW 13.1 11.5 - 15.5 %   Platelets 190 150 - 400 K/uL   nRBC 0.0 0.0 - 0.2 %    Comment: Performed at [facility], [place] [address] [place], [place] [id number]  Basic metabolic panel     Status: Abnormal   Collection Time: 09/12/22  1:42 AM  Result Value Ref Range   Sodium 138 135 - 145 mmol/L   Potassium 3.7 3.5 - 5.1 mmol/L   Chloride 102 98 - 111 mmol/L   CO2 26 22 - 32 mmol/L   Glucose, Bld 108 (H) 70 - 99 mg/dL    Comment: Glucose reference range applies only to samples taken after fasting for at least 8 hours.   BUN 13 8 - 23 mg/dL   Creatinine, Ser 0.95 0.61 - 1.24 mg/dL   Calcium 8.5 (L) 8.9 - 10.3 mg/dL   GFR, Estimated >60 >60 mL/min    Comment: (NOTE) Calculated using the CKD-EPI Creatinine Equation (2021)    Anion gap 10 5 - 15    Comment: Performed at [facility] [address] [place], [place] [address]  Respiratory (~20 pathogens) panel by PCR     Status: Abnormal   Collection Time: 09/12/22  8:30 AM   Specimen: Nasopharyngeal Swab; Respiratory  Result Value Ref Range   Adenovirus NOT DETECTED NOT DETECTED   Coronavirus 229E NOT DETECTED NOT DETECTED    Comment: (NOTE) The Coronavirus on the Respiratory Panel, DOES NOT test for the novel  Coronavirus (2019 nCoV)    Coronavirus HKU1 NOT  DETECTED NOT DETECTED   Coronavirus NL63 NOT DETECTED NOT DETECTED   Coronavirus OC43 NOT DETECTED NOT DETECTED   Metapneumovirus NOT DETECTED NOT DETECTED   Rhinovirus / Enterovirus DETECTED (A) NOT DETECTED   Influenza A NOT DETECTED NOT DETECTED   Influenza B NOT DETECTED NOT DETECTED   Parainfluenza Virus 1 NOT DETECTED NOT DETECTED   Parainfluenza Virus 2 NOT DETECTED NOT DETECTED   Parainfluenza Virus 3 NOT DETECTED NOT DETECTED   Parainfluenza Virus 4 NOT DETECTED NOT DETECTED   Respiratory Syncytial Virus  NOT DETECTED NOT DETECTED   Bordetella pertussis NOT DETECTED NOT DETECTED   Bordetella Parapertussis NOT DETECTED NOT DETECTED   Chlamydophila pneumoniae NOT DETECTED NOT DETECTED   Mycoplasma pneumoniae NOT DETECTED NOT DETECTED    Comment: Performed at [facility], [place] [address], [place], [place] [address]  ECHOCARDIOGRAM COMPLETE     Status: None   Collection Time: 09/12/22  2:48 PM  Result Value Ref Range   Weight 3,088 oz   Height 69 in   BP 109/95 mmHg   S' Lateral 3.58 cm  CBC with Differential/Platelet     Status: Abnormal   Collection Time: 09/13/22  1:18 AM  Result Value Ref Range   WBC 12.4 (H) 4.0 - 10.5 K/uL   RBC 4.59 4.22 - 5.81 MIL/uL   Hemoglobin 14.1 13.0 - 17.0 g/dL   HCT 42.6 39.0 - 52.0 %   MCV 92.8 80.0 - 100.0 fL   MCH 30.7 26.0 - 34.0 pg   MCHC 33.1 30.0 - 36.0 g/dL   RDW 13.0 11.5 - 15.5 %   Platelets 210 150 - 400 K/uL   nRBC 0.0 0.0 - 0.2 %   Neutrophils Relative % 60 %   Neutro Abs 7.4 1.7 - 7.7 K/uL   Lymphocytes Relative 24 %   Lymphs Abs 3.0 0.7 - 4.0 K/uL   Monocytes Relative 15 %   Monocytes Absolute 1.8 (H) 0.1 - 1.0 K/uL   Eosinophils Relative 1 %   Eosinophils Absolute 0.1 0.0 - 0.5 K/uL   Basophils Relative 0 %   Basophils Absolute 0.0 0.0 - 0.1 K/uL   Immature Granulocytes 0 %   Abs Immature Granulocytes 0.04 0.00 - 0.07 K/uL    Comment: Performed at [facility], [address], [place], [place] [address]   Comprehensive metabolic panel     Status: Abnormal   Collection Time: 09/13/22  1:18 AM  Result Value Ref Range   Sodium 132 (L) 135 - 145 mmol/L   Potassium 3.6 3.5 - 5.1 mmol/L   Chloride 97 (L) 98 - 111 mmol/L   CO2 25 22 - 32 mmol/L   Glucose, Bld 118 (H) 70 - 99 mg/dL    Comment: Glucose reference range applies only to samples taken after fasting for at least 8 hours.   BUN 9 8 - 23 mg/dL   Creatinine, Ser 0.90 0.61 - 1.24 mg/dL   Calcium 8.5 (L) 8.9 - 10.3 mg/dL   Total Protein 6.0 (L) 6.5 - 8.1 g/dL   Albumin 3.4 (L) 3.5 - 5.0 g/dL   AST 29 15 - 41 U/L   ALT 35 0 - 44 U/L   Alkaline Phosphatase 93 38 - 126 U/L   Total Bilirubin 0.7 0.3 - 1.2 mg/dL   GFR, Estimated >60 >60 mL/min    Comment: (NOTE) Calculated using the CKD-EPI Creatinine Equation (2021)    Anion gap 10 5 - 15    Comment: Performed at [facility], [place] [address], [place], [place] [address]  Magnesium     Status: None   Collection Time: 09/13/22  1:18 AM  Result Value Ref Range   Magnesium 1.7 1.7 - 2.4 mg/dL    Comment: Performed at [facility] [address], [place], [place] [address]  Phosphorus     Status: None   Collection Time: 09/13/22  1:18 AM  Result Value Ref Range   Phosphorus 4.0 2.5 - 4.6 mg/dL    Comment:  Performed at [facility], [place] [address], [place], [place] [id number]  Basic metabolic panel     Status: Abnormal   Collection Time: 09/14/22  1:35 AM  Result Value Ref Range   Sodium 131 (L) 135 - 145 mmol/L   Potassium 4.3 3.5 - 5.1 mmol/L   Chloride 96 (L) 98 - 111 mmol/L   CO2 25 22 - 32 mmol/L   Glucose, Bld 115 (H) 70 - 99 mg/dL    Comment: Glucose reference range applies only to samples taken after fasting for at least 8 hours.   BUN 6 (L) 8 - 23 mg/dL   Creatinine, Ser 0.86 0.61 - 1.24 mg/dL   Calcium 8.7 (L) 8.9 - 10.3 mg/dL   GFR, Estimated >60 >60 mL/min    Comment: (NOTE) Calculated using the CKD-EPI Creatinine Equation (2021)    Anion gap 10 5 -  15    Comment: Performed at [facility] [address], [place], [place] [address]  CBC     Status: Abnormal   Collection Time: 09/14/22  1:35 AM  Result Value Ref Range   WBC 15.8 (H) 4.0 - 10.5 K/uL   RBC 4.69 4.22 - 5.81 MIL/uL   Hemoglobin 14.6 13.0 - 17.0 g/dL   HCT 42.3 39.0 - 52.0 %   MCV 90.2 80.0 - 100.0 fL   MCH 31.1 26.0 - 34.0 pg   MCHC 34.5 30.0 - 36.0 g/dL   RDW 12.7 11.5 - 15.5 %   Platelets 201 150 - 400 K/uL   nRBC 0.0 0.0 - 0.2 %    Comment: Performed at [facility], [place] [address] [place], [place] [address]  Magnesium     Status: None   Collection Time: 09/14/22  1:35 AM  Result Value Ref Range   Magnesium 1.7 1.7 - 2.4 mg/dL    Comment: Performed at [facility] [address] [place], [place] [address]  Phosphorus     Status: None   Collection Time: 09/14/22  1:35 AM  Result Value Ref Range   Phosphorus 4.0 2.5 - 4.6 mg/dL    Comment: Performed at [facility] [address] [place], [place] [address]  Protime-INR     Status: Abnormal   Collection Time: 09/14/22  6:32 PM  Result Value Ref Range   Prothrombin Time 21.3 (H) 11.4 - 15.2 seconds   INR 1.9 (H) 0.8 - 1.2    Comment: (NOTE) INR goal varies based on device and disease states. Performed at [facility], [place] [address] [place], [place] [id number]   Basic metabolic panel     Status: Abnormal   Collection Time: 09/15/22  2:30 AM  Result Value Ref Range   Sodium 135 135 - 145 mmol/L   Potassium 3.5 3.5 - 5.1 mmol/L   Chloride 99 98 - 111 mmol/L   CO2 25 22 - 32 mmol/L   Glucose, Bld 96 70 - 99 mg/dL    Comment: Glucose reference range applies only to samples taken after fasting for at least 8 hours.   BUN 8 8 - 23 mg/dL   Creatinine, Ser 1.11 0.61 - 1.24 mg/dL   Calcium 8.7 (L) 8.9 - 10.3 mg/dL   GFR, Estimated >60 >60 mL/min    Comment: (NOTE) Calculated using the CKD-EPI Creatinine Equation (2021)    Anion gap 11 5 - 15    Comment: Performed at [facility]  [address], [place], [place] [address]  CBC     Status: Abnormal   Collection Time: 09/15/22  2:30 AM  Result Value Ref Range   WBC 15.2 (H) 4.0 - 10.5 K/uL   RBC 4.53 4.22 - 5.81 MIL/uL   Hemoglobin 13.8 13.0 - 17.0 g/dL   HCT 41.9 39.0 - 52.0 %   MCV 92.5 80.0 - 100.0 fL   MCH 30.5 26.0 - 34.0 pg   MCHC 32.9 30.0 - 36.0 g/dL   RDW 12.8 11.5 - 15.5 %   Platelets 208 150 - 400 K/uL   nRBC 0.0 0.0 - 0.2 %    Comment: Performed at [facility], [place] [address] [place], [place] [address]  Magnesium     Status: None   Collection Time: 09/15/22  2:30 AM  Result Value Ref Range   Magnesium 1.7 1.7 - 2.4 mg/dL    Comment: Performed at [facility] [address], [place], [place] [address]  Phosphorus     Status: None   Collection Time: 09/15/22  2:30 AM  Result Value Ref Range   Phosphorus 3.8 2.5 - 4.6 mg/dL    Comment: Performed at [facility] [address] [place], [place] [address]  CBC with Differential     Status: Abnormal   Collection Time: 09/17/22  4:41 PM  Result Value Ref Range   WBC 23.4 (H) 4.0 - 10.5 K/uL   RBC 4.90 4.22 - 5.81 MIL/uL   Hemoglobin 15.0 13.0 - 17.0 g/dL   HCT 45.3 39.0 - 52.0 %   MCV 92.4 80.0 - 100.0 fL   MCH 30.6 26.0 - 34.0 pg   MCHC 33.1 30.0 - 36.0 g/dL   RDW 12.8 11.5 - 15.5 %   Platelets 281 150 - 400 K/uL   nRBC 0.0 0.0 - 0.2 %   Neutrophils Relative % 87 %   Neutro Abs 20.6 (H) 1.7 - 7.7 K/uL   Lymphocytes Relative 5 %   Lymphs Abs 1.1 0.7 - 4.0 K/uL   Monocytes Relative 7 %   Monocytes Absolute 1.6 (H) 0.1 - 1.0 K/uL   Eosinophils Relative 0 %   Eosinophils Absolute 0.0 0.0 - 0.5 K/uL   Basophils Relative 0 %   Basophils Absolute 0.0 0.0 - 0.1 K/uL   Immature Granulocytes 1 %   Abs Immature Granulocytes 0.12 (H) 0.00 - 0.07 K/uL    Comment: Performed at [facility], [place], [place] [address]  Comprehensive metabolic panel     Status: Abnormal    Collection Time: 09/17/22  4:41 PM  Result Value Ref Range   Sodium 139 135 - 145 mmol/L   Potassium 4.1 3.5 - 5.1 mmol/L   Chloride 100 98 - 111 mmol/L   CO2 29 22 - 32 mmol/L   Glucose, Bld 111 (H) 70 - 99 mg/dL    Comment: Glucose reference range applies only to samples taken after fasting for at least 8 hours.   BUN 14 8 - 23 mg/dL   Creatinine, Ser 0.85 0.61 - 1.24 mg/dL   Calcium 9.2 8.9 - 10.3 mg/dL   Total Protein 7.1 6.5 - 8.1 g/dL   Albumin 4.3 3.5 - 5.0 g/dL   AST 12 (L) 15 - 41 U/L   ALT 21 0 - 44 U/L   Alkaline Phosphatase 96 38 - 126 U/L   Total Bilirubin 0.8 0.3 - 1.2 mg/dL   GFR, Estimated >60 >60 mL/min  Comment: (NOTE) Calculated using the CKD-EPI Creatinine Equation (2021)    Anion gap 10 5 - 15    Comment: Performed at [facility], [address] [place], [place] [address]  Resp panel by RT-PCR (RSV, Flu A&B, Covid) Anterior Nasal Swab     Status: None   Collection Time: 09/17/22  4:41 PM   Specimen: Anterior Nasal Swab  Result Value Ref Range   SARS Coronavirus 2 by RT PCR NEGATIVE NEGATIVE    Comment: (NOTE) SARS-CoV-2 target nucleic acids are NOT DETECTED.  The SARS-CoV-2 RNA is generally detectable in upper respiratory specimens during the acute phase of infection. The lowest concentration of SARS-CoV-2 viral copies this assay can detect is 138 copies/mL. A negative result does not preclude SARS-Cov-2 infection and should not be used as the sole basis for treatment or other patient management decisions. A negative result may occur with  improper specimen collection/handling, submission of specimen other than nasopharyngeal swab, presence of viral mutation(s) within the areas targeted by this assay, and inadequate number of viral copies(<138 copies/mL). A negative result must be combined with clinical observations, patient history, and epidemiological information. The expected result is Negative.  Fact Sheet for Patients:   EntrepreneurPulse.com.au  Fact Sheet for Healthcare Providers:  IncredibleEmployment.be  This test is no t yet approved or cleared by the [place] FDA and  has been authorized for detection and/or diagnosis of SARS-CoV-2 by FDA under an Emergency Use Authorization (EUA). This EUA will remain  in effect (meaning this test can be used) for the duration of the COVID-19 declaration under Section 564(b)(1) of the Act, 21 U.S.C.section 360bbb-3(b)(1), unless the authorization is terminated  or revoked sooner.       Influenza A by PCR NEGATIVE NEGATIVE   Influenza B by PCR NEGATIVE NEGATIVE    Comment: (NOTE) The Xpert Xpress SARS-CoV-2/FLU/RSV plus assay is intended as an aid in the diagnosis of influenza from Nasopharyngeal swab specimens and should not be used as a sole basis for treatment. Nasal washings and aspirates are unacceptable for Xpert Xpress SARS-CoV-2/FLU/RSV testing.  Fact Sheet for Patients: EntrepreneurPulse.com.au  Fact Sheet for Healthcare Providers: IncredibleEmployment.be  This test is not yet approved or cleared by the [place] FDA and has been authorized for detection and/or diagnosis of SARS-CoV-2 by FDA under an Emergency Use Authorization (EUA). This EUA will remain in effect (meaning this test can be used) for the duration of the COVID-19 declaration under Section 564(b)(1) of the Act, 21 U.S.C. section 360bbb-3(b)(1), unless the authorization is terminated or revoked.     Resp Syncytial Virus by PCR NEGATIVE NEGATIVE    Comment: (NOTE) Fact Sheet for Patients: EntrepreneurPulse.com.au  Fact Sheet for Healthcare Providers: IncredibleEmployment.be  This test is not yet approved or cleared by the [place] FDA and has been authorized for detection and/or diagnosis of SARS-CoV-2 by FDA under an Emergency Use Authorization (EUA).  This EUA will remain in effect (meaning this test can be used) for the duration of the COVID-19 declaration under Section 564(b)(1) of the Act, 21 U.S.C. section 360bbb-3(b)(1), unless the authorization is terminated or revoked.  Performed at [facility], [address], [place], [place] [address]   Protime-INR     Status: Abnormal   Collection Time: 09/17/22  4:48 PM  Result Value Ref Range   Prothrombin Time 17.2 (H) 11.4 - 15.2 seconds   INR 1.4 (H) 0.8 - 1.2    Comment: (NOTE) INR goal varies based on device and  disease states. Performed at [facility], [address] [place], [place] [address]   Blood Culture (routine x 2)     Status: None   Collection Time: 09/17/22  8:35 PM   Specimen: BLOOD  Result Value Ref Range   Specimen Description      BLOOD RIGHT ANTECUBITAL Performed at [facility], [address] [place], [place] [address]    Special Requests      BOTTLES DRAWN AEROBIC AND ANAEROBIC Blood Culture adequate volume Performed at [facility], [address] [place], [place] [address]    Culture      NO GROWTH 5 DAYS Performed at [facility], [place] [address], [place], [place] [address]    Report Status 09/22/2022 FINAL   Blood Culture (routine x 2)     Status: None   Collection Time: 09/17/22  8:38 PM   Specimen: BLOOD  Result Value Ref Range   Specimen Description      BLOOD LEFT ANTECUBITAL Performed at [facility], [address] [place], [place] [address]    Special Requests      BOTTLES DRAWN AEROBIC AND ANAEROBIC Blood Culture adequate volume Performed at [facility], [address] [place], [place] [address]    Culture      NO GROWTH 5 DAYS Performed at [facility], [place] [address] [place], [place] [address]    Report Status 09/22/2022 FINAL   Lactic acid, plasma     Status: None   Collection Time:  09/17/22 10:08 PM  Result Value Ref Range   Lactic Acid, Venous 1.2 0.5 - 1.9 mmol/L    Comment: Performed at [facility], [address] [place], [place] [address]  CBC with Differential     Status: Abnormal   Collection Time: 09/18/22  7:50 AM  Result Value Ref Range   WBC 20.3 (H) 4.0 - 10.5 K/uL   RBC 4.04 (L) 4.22 - 5.81 MIL/uL   Hemoglobin 12.3 (L) 13.0 - 17.0 g/dL   HCT 37.0 (L) 39.0 - 52.0 %   MCV 91.6 80.0 - 100.0 fL   MCH 30.4 26.0 - 34.0 pg   MCHC 33.2 30.0 - 36.0 g/dL   RDW 13.0 11.5 - 15.5 %   Platelets 212 150 - 400 K/uL   nRBC 0.0 0.0 - 0.2 %   Neutrophils Relative % 77 %   Neutro Abs 15.9 (H) 1.7 - 7.7 K/uL   Lymphocytes Relative 11 %   Lymphs Abs 2.2 0.7 - 4.0 K/uL   Monocytes Relative 11 %   Monocytes Absolute 2.1 (H) 0.1 - 1.0 K/uL   Eosinophils Relative 0 %   Eosinophils Absolute 0.1 0.0 - 0.5 K/uL   Basophils Relative 0 %   Basophils Absolute 0.1 0.0 - 0.1 K/uL   Immature Granulocytes 1 %   Abs Immature Granulocytes 0.10 (H) 0.00 - 0.07 K/uL    Comment: Performed at [facility], [address] [place],  [id number]  Basic metabolic panel     Status: Abnormal   Collection Time: 09/18/22  7:50 AM  Result Value Ref Range   Sodium 137 135 - 145 mmol/L   Potassium 3.5 3.5 - 5.1 mmol/L   Chloride 100 98 - 111 mmol/L   CO2 29 22 - 32 mmol/L   Glucose, Bld 102 (H) 70 - 99 mg/dL    Comment: Glucose reference range applies only to samples taken after fasting for at least  8 hours.   BUN 10 8 - 23 mg/dL   Creatinine, Ser 0.65 0.61 - 1.24 mg/dL   Calcium 8.1 (L) 8.9 - 10.3 mg/dL   GFR, Estimated >60 >60 mL/min    Comment: (NOTE) Calculated using the CKD-EPI Creatinine Equation (2021)    Anion gap 8 5 - 15    Comment: Performed at [facility], [address], [place], [place] [address]  Lactic acid, plasma     Status: None   Collection Time: 09/18/22  7:50 AM  Result Value Ref Range    Lactic Acid, Venous 0.8 0.5 - 1.9 mmol/L    Comment: Performed at [facility], [address], [place], [place] [address]  Lactic acid, plasma     Status: None   Collection Time: 09/18/22  7:50 AM  Result Value Ref Range   Lactic Acid, Venous 1.5 0.5 - 1.9 mmol/L    Comment: Performed at [facility], [address], [place], [place] [address]  CBG monitoring, ED     Status: Abnormal   Collection Time: 09/18/22  1:44 PM  Result Value Ref Range   Glucose-Capillary 115 (H) 70 - 99 mg/dL    Comment: Glucose reference range applies only to samples taken after fasting for at least 8 hours.  Strep pneumoniae urinary antigen     Status: None   Collection Time: 09/18/22  8:45 PM  Result Value Ref Range   Strep Pneumo Urinary Antigen NEGATIVE NEGATIVE    Comment:        Infection due to S. pneumoniae cannot be absolutely ruled out since the antigen present may be below the detection limit of the test. Performed at [facility], [address] [place], [place] [address]   Legionella Pneumophila Serogp 1 Ur Ag     Status: None   Collection Time: 09/18/22  8:45 PM  Result Value Ref Range   L. pneumophila Serogp 1 Ur Ag Negative Negative    Comment: (NOTE) Presumptive negative for L. pneumophila serogroup 1 antigen in urine, suggesting no recent or current infection. Legionnaires' disease cannot be ruled out since other serogroups and species may also cause disease. Performed At: [facility] [place], [place] [id number] Rush Farmer MD [id number]    Source of Sample URINE, RANDOM     Comment: Performed at [facility], [place] [address], [place], [place] [address]  MRSA Next Gen by PCR, Nasal     Status: Abnormal   Collection Time: 09/18/22  8:45 PM   Specimen: Nasal Mucosa; Nasal Swab  Result Value Ref Range   MRSA by PCR Next Gen DETECTED (A) NOT DETECTED    Comment: RESULT CALLED TO, READ BACK BY AND  VERIFIED WITH: F JONES,RN'@[id number]'$  09/19/22 [place] (NOTE) The GeneXpert MRSA Assay (FDA approved for NASAL specimens only), is one component of a comprehensive MRSA colonization surveillance program. It is not intended to diagnose MRSA infection nor to guide or monitor treatment for MRSA infections. Test performance is not FDA approved in patients less than 28 years old. Performed at [facility], [place] [address], [place], [place] [address]   Expectorated Sputum Assessment w Gram Stain, Rflx to Resp Cult     Status: None   Collection Time: 09/18/22  8:46 PM   Specimen: Sputum  Result Value Ref Range   Specimen Description SPUTUM    Special Requests NONE    Sputum evaluation      THIS SPECIMEN IS  ACCEPTABLE FOR SPUTUM CULTURE Performed at [facility], [place] [address], [place], [place] [address]    Report Status 09/19/2022 FINAL   Culture, Respiratory w Gram Stain     Status: None   Collection Time: 09/18/22  8:46 PM   Specimen: SPU  Result Value Ref Range   Specimen Description SPUTUM    Special Requests NONE Reflexed from [id number]    Gram Stain      FEW WBC PRESENT, PREDOMINANTLY PMN FEW GRAM POSITIVE COCCI IN PAIRS IN CLUSTERS    Culture      RARE Normal respiratory flora-no Staph aureus or Pseudomonas seen Performed at [facility], [place] [address], [place], [place] [address]    Report Status 09/21/2022 FINAL   Magnesium     Status: None   Collection Time: 09/19/22 12:59 AM  Result Value Ref Range   Magnesium 1.8 1.7 - 2.4 mg/dL    Comment: Performed at [facility] [address], [place], [place] [id number]  Basic metabolic panel     Status: Abnormal   Collection Time: 09/19/22  1:02 AM  Result Value Ref Range   Sodium 135 135 - 145 mmol/L   Potassium 3.7 3.5 - 5.1 mmol/L   Chloride 101 98 - 111 mmol/L   CO2 30 22 - 32 mmol/L   Glucose, Bld 126 (H) 70 - 99 mg/dL    Comment: Glucose reference range applies only to samples taken after fasting for at  least 8 hours.   BUN <5 (L) 8 - 23 mg/dL   Creatinine, Ser 0.82 0.61 - 1.24 mg/dL   Calcium 8.1 (L) 8.9 - 10.3 mg/dL   GFR, Estimated >60 >60 mL/min    Comment: (NOTE) Calculated using the CKD-EPI Creatinine Equation (2021)    Anion gap 4 (L) 5 - 15    Comment: Performed at [facility] [address] [place], [place] [address]  CBC     Status: Abnormal   Collection Time: 09/19/22  1:02 AM  Result Value Ref Range   WBC 13.8 (H) 4.0 - 10.5 K/uL   RBC 4.18 (L) 4.22 - 5.81 MIL/uL   Hemoglobin 12.9 (L) 13.0 - 17.0 g/dL   HCT 38.1 (L) 39.0 - 52.0 %   MCV 91.1 80.0 - 100.0 fL   MCH 30.9 26.0 - 34.0 pg   MCHC 33.9 30.0 - 36.0 g/dL   RDW 12.7 11.5 - 15.5 %   Platelets 204 150 - 400 K/uL   nRBC 0.0 0.0 - 0.2 %    Comment: Performed at [facility], [place] [address], [place], [place] [address]  Calcium, ionized     Status: None   Collection Time: 09/19/22  1:02 AM  Result Value Ref Range   Calcium, Ionized, Serum 4.7 4.5 - 5.6 mg/dL    Comment: (NOTE) Performed At: [facility] [place], [place] [id number] Rush Farmer MD [id number]   Brain natriuretic peptide     Status: Abnormal   Collection Time: 09/19/22  1:02 AM  Result Value Ref Range   B Natriuretic Peptide 458.8 (H) 0.0 - 100.0 pg/mL    Comment: Performed at [facility], [address] [place], [place] [address]  Magnesium     Status: None   Collection Time: 09/19/22 12:14 PM  Result Value Ref Range   Magnesium 2.0 1.7 - 2.4 mg/dL    Comment: Performed at [facility]  [address], [place], [place] [address]  Renal function panel     Status: Abnormal   Collection Time: 09/20/22 12:41 AM  Result Value Ref Range   Sodium 134 (L) 135 - 145 mmol/L   Potassium 3.8 3.5 - 5.1 mmol/L   Chloride 98 98 - 111 mmol/L   CO2 29 22 - 32 mmol/L   Glucose, Bld 114 (H) 70 - 99 mg/dL    Comment: Glucose reference range applies only to samples taken after fasting for at least 8  hours.   BUN <5 (L) 8 - 23 mg/dL   Creatinine, Ser 0.73 0.61 - 1.24 mg/dL   Calcium 8.1 (L) 8.9 - 10.3 mg/dL   Phosphorus 3.3 2.5 - 4.6 mg/dL   Albumin 2.9 (L) 3.5 - 5.0 g/dL   GFR, Estimated >60 >60 mL/min    Comment: (NOTE) Calculated using the CKD-EPI Creatinine Equation (2021)    Anion gap 7 5 - 15    Comment: Performed at [facility] [address], [place], [place] [address]  Magnesium     Status: None   Collection Time: 09/20/22 12:41 AM  Result Value Ref Range   Magnesium 1.8 1.7 - 2.4 mg/dL    Comment: Performed at [facility] [address] [place], [place] [address]  CBC     Status: Abnormal   Collection Time: 09/20/22 12:41 AM  Result Value Ref Range   WBC 14.0 (H) 4.0 - 10.5 K/uL   RBC 4.23 4.22 - 5.81 MIL/uL   Hemoglobin 13.2 13.0 - 17.0 g/dL   HCT 38.4 (L) 39.0 - 52.0 %   MCV 90.8 80.0 - 100.0 fL   MCH 31.2 26.0 - 34.0 pg   MCHC 34.4 30.0 - 36.0 g/dL   RDW 12.4 11.5 - 15.5 %   Platelets 231 150 - 400 K/uL   nRBC 0.0 0.0 - 0.2 %    Comment: Performed at [facility], [place] [address] [place], [place] [address]  Vitamin B12     Status: None   Collection Time: 09/20/22 12:41 AM  Result Value Ref Range   Vitamin B-12 583 180 - 914 pg/mL    Comment: (NOTE) This assay is not validated for testing neonatal or myeloproliferative syndrome specimens for Vitamin B12 levels. Performed at [facility], [place] [address] [place], [place] [address]   Iron and TIBC     Status: Abnormal   Collection Time: 09/20/22 12:41 AM  Result Value Ref Range   Iron 26 (L) 45 - 182 ug/dL   TIBC 332 250 - 450 ug/dL   Saturation Ratios 8 (L) 17.9 - 39.5 %   UIBC 306 ug/dL    Comment: Performed at [facility], [place] [address], [place], [place] [address]  Ferritin     Status: None   Collection Time: 09/20/22 12:41 AM  Result Value Ref Range   Ferritin 84 24 - 336 ng/mL    Comment: Performed at [facility], [place] [address], [place], [place]  [address]  Reticulocytes     Status: None   Collection Time: 09/20/22 12:41 AM  Result Value Ref Range   Retic Ct Pct 1.3 0.4 - 3.1 %   RBC. 4.36 4.22 - 5.81 MIL/uL   Retic Count, Absolute 54.9 19.0 - 186.0 K/uL   Immature Retic Fract 12.6 2.3 - 15.9 %    Comment: Performed at [facility] [address], [place],  [address]  Brain natriuretic peptide     Status: Abnormal   Collection Time: 09/20/22 12:41 AM  Result Value Ref Range   B Natriuretic Peptide 411.4 (H) 0.0 - 100.0 pg/mL    Comment: Performed at [facility] [address] [place], [place] [address]  Folate     Status: None   Collection Time: 09/20/22 12:41 AM  Result Value Ref Range   Folate 18.6 >5.9 ng/mL    Comment: Performed at [facility] [address], [place], [place] [address]  Folate     Status: None   Collection Time: 09/20/22  9:59 PM  Result Value Ref Range   Folate 17.6 >5.9 ng/mL    Comment: Performed at [facility] [address] [place], [place] [address]  Vancomycin, peak     Status: Abnormal   Collection Time: 09/20/22 10:00 PM  Result Value Ref Range   Vancomycin Pk 29 (L) 30 - 40 ug/mL    Comment: Performed at [facility], [place] [address], [place], [place] [address]  Renal function panel     Status: Abnormal   Collection Time: 09/21/22 12:34 AM  Result Value Ref Range   Sodium 134 (L) 135 - 145 mmol/L   Potassium 3.5 3.5 - 5.1 mmol/L   Chloride 99 98 - 111 mmol/L   CO2 28 22 - 32 mmol/L   Glucose, Bld 96 70 - 99 mg/dL    Comment: Glucose reference range applies only to samples taken after fasting for at least 8 hours.   BUN <5 (L) 8 - 23 mg/dL   Creatinine, Ser 0.75 0.61 - 1.24 mg/dL   Calcium 8.3 (L) 8.9 - 10.3 mg/dL   Phosphorus 3.3 2.5 - 4.6 mg/dL   Albumin 2.9 (L) 3.5 - 5.0 g/dL   GFR, Estimated >60 >60 mL/min    Comment: (NOTE) Calculated using the CKD-EPI Creatinine Equation (2021)    Anion gap 7 5 - 15    Comment: Performed at [facility] [address], [place], [place] [address]  Magnesium     Status: None   Collection Time: 09/21/22 12:34 AM  Result Value Ref Range   Magnesium 1.9 1.7 - 2.4 mg/dL    Comment: Performed at [facility] [address], [place], [place] [address]  CBC     Status: Abnormal   Collection Time: 09/21/22 12:34 AM  Result Value Ref Range   WBC 11.6 (H) 4.0 - 10.5 K/uL   RBC 4.36 4.22 - 5.81 MIL/uL   Hemoglobin 13.1 13.0 - 17.0 g/dL   HCT 40.3 39.0 - 52.0 %   MCV 92.4 80.0 - 100.0 fL   MCH 30.0 26.0 - 34.0 pg   MCHC 32.5 30.0 - 36.0 g/dL   RDW 12.3 11.5 - 15.5 %   Platelets 226 150 - 400 K/uL   nRBC 0.0 0.0 - 0.2 %    Comment: Performed at [facility], [place] [address] [place], [place] [address]  Vancomycin, trough     Status: Abnormal   Collection Time: 09/21/22  6:15 AM  Result Value Ref Range   Vancomycin Tr 10 (L) 15 - 20 ug/mL    Comment: Performed at [facility], [place] [address] [place], [place] [address]  Renal function panel     Status: Abnormal   Collection Time: 09/22/22  1:14 AM  Result Value Ref Range   Sodium 134 (L) 135 - 145  mmol/L   Potassium 3.6 3.5 - 5.1 mmol/L   Chloride 100 98 - 111 mmol/L   CO2 25 22 - 32 mmol/L   Glucose, Bld 89 70 - 99 mg/dL    Comment: Glucose reference range applies only to samples taken after fasting for at least 8 hours.   BUN <5 (L) 8 - 23 mg/dL   Creatinine, Ser 0.75 0.61 - 1.24 mg/dL   Calcium 8.3 (L) 8.9 - 10.3 mg/dL   Phosphorus 3.1 2.5 - 4.6 mg/dL   Albumin 2.9 (L) 3.5 - 5.0 g/dL   GFR, Estimated >60 >60 mL/min    Comment: (NOTE) Calculated using the CKD-EPI Creatinine Equation (2021)    Anion gap 9 5 - 15    Comment: Performed at [facility] [address], [place], [place] [address]  Magnesium     Status: None   Collection Time: 09/22/22  1:14 AM  Result Value Ref Range   Magnesium 1.9 1.7 - 2.4 mg/dL    Comment: Performed at [facility] [address] [place], [place] [address]   CBC     Status: Abnormal   Collection Time: 09/22/22  1:14 AM  Result Value Ref Range   WBC 12.3 (H) 4.0 - 10.5 K/uL   RBC 4.27 4.22 - 5.81 MIL/uL   Hemoglobin 13.2 13.0 - 17.0 g/dL   HCT 38.6 (L) 39.0 - 52.0 %   MCV 90.4 80.0 - 100.0 fL   MCH 30.9 26.0 - 34.0 pg   MCHC 34.2 30.0 - 36.0 g/dL   RDW 12.4 11.5 - 15.5 %   Platelets 246 150 - 400 K/uL   nRBC 0.0 0.0 - 0.2 %    Comment: Performed at [facility], [place] [address] [place], [place] [address]  Procalcitonin - Baseline     Status: None   Collection Time: 09/22/22  1:14 AM  Result Value Ref Range   Procalcitonin <0.10 ng/mL    Comment:        Interpretation: PCT (Procalcitonin) <= 0.5 ng/mL: Systemic infection (sepsis) is not likely. Local bacterial infection is possible. (NOTE)       Sepsis PCT Algorithm           Lower Respiratory Tract                                      Infection PCT Algorithm    ----------------------------     ----------------------------         PCT < 0.25 ng/mL                PCT < 0.10 ng/mL          Strongly encourage             Strongly discourage   discontinuation of antibiotics    initiation of antibiotics    ----------------------------     -----------------------------       PCT 0.25 - 0.50 ng/mL            PCT 0.10 - 0.25 ng/mL               OR       >80% decrease in PCT            Discourage initiation of  antibiotics      Encourage discontinuation           of antibiotics    ----------------------------     -----------------------------         PCT >= 0.50 ng/mL              PCT 0.26 - 0.50 ng/mL               AND        <80% decrease in PCT             Encourage initiation of                                             antibiotics       Encourage continuation           of antibiotics    ----------------------------     -----------------------------        PCT >= 0.50 ng/mL                  PCT > 0.50 ng/mL               AND          increase in PCT                  Strongly encourage                                      initiation of antibiotics    Strongly encourage escalation           of antibiotics                                     -----------------------------                                           PCT <= 0.25 ng/mL                                                 OR                                        > 80% decrease in PCT                                      Discontinue / Do not initiate                                             antibiotics  Performed at [facility], [place] [address] [place], [place] [address]   Magnesium     Status: None   Collection Time:  09/23/22 12:39 AM  Result Value Ref Range   Magnesium 2.0 1.7 - 2.4 mg/dL    Comment: Performed at [facility] [address] [place], [place] [address]  Renal function panel     Status: Abnormal   Collection Time: 09/23/22 12:39 AM  Result Value Ref Range   Sodium 134 (L) 135 - 145 mmol/L   Potassium 4.3 3.5 - 5.1 mmol/L   Chloride 101 98 - 111 mmol/L   CO2 26 22 - 32 mmol/L   Glucose, Bld 109 (H) 70 - 99 mg/dL    Comment: Glucose reference range applies only to samples taken after fasting for at least 8 hours.   BUN <5 (L) 8 - 23 mg/dL   Creatinine, Ser 0.78 0.61 - 1.24 mg/dL   Calcium 8.4 (L) 8.9 - 10.3 mg/dL   Phosphorus 3.4 2.5 - 4.6 mg/dL   Albumin 3.0 (L) 3.5 - 5.0 g/dL   GFR, Estimated >60 >60 mL/min    Comment: (NOTE) Calculated using the CKD-EPI Creatinine Equation (2021)    Anion gap 7 5 - 15    Comment: Performed at [facility] [address], [place], [place] [address]  Comp Met (CMET)     Status: Abnormal   Collection Time: 10/19/22 10:03 AM  Result Value Ref Range   Glucose 130 (H) 70 - 99 mg/dL   BUN 10 8 - 27 mg/dL   Creatinine, Ser 0.83 0.76 - 1.27 mg/dL   eGFR 98 >59 mL/min/1.73   BUN/Creatinine Ratio 12 10 - 24   Sodium 140 134 - 144 mmol/L   Potassium 3.9 3.5 - 5.2 mmol/L    Chloride 100 96 - 106 mmol/L   CO2 26 20 - 29 mmol/L   Calcium 9.0 8.6 - 10.2 mg/dL   Total Protein 6.6 6.0 - 8.5 g/dL   Albumin 4.3 3.9 - 4.9 g/dL   Globulin, Total 2.3 1.5 - 4.5 g/dL   Albumin/Globulin Ratio 1.9 1.2 - 2.2   Bilirubin Total 0.5 0.0 - 1.2 mg/dL   Alkaline Phosphatase 119 44 - 121 IU/L   AST 22 0 - 40 IU/L   ALT 32 0 - 44 IU/L  TSH     Status: None   Collection Time: 10/19/22 10:03 AM  Result Value Ref Range   TSH 0.643 0.450 - 4.500 uIU/mL  T4, free     Status: Abnormal   Collection Time: 10/19/22 10:03 AM  Result Value Ref Range   Free T4 2.27 (H) 0.82 - 1.77 ng/dL  CBC     Status: None   Collection Time: 10/19/22 10:03 AM  Result Value Ref Range   WBC 9.4 3.4 - 10.8 x10E3/uL   RBC 4.84 4.14 - 5.80 x10E6/uL   Hemoglobin 14.7 13.0 - 17.7 g/dL   Hematocrit 44.7 37.5 - 51.0 %   MCV 92 79 - 97 fL   MCH 30.4 26.6 - 33.0 pg   MCHC 32.9 31.5 - 35.7 g/dL   RDW 13.6 11.6 - 15.4 %   Platelets 175 150 - 450 x10E3/uL          Signed: Loralee Pacas, MD 11/06/2022 9:13 AM

## 2022-11-05 NOTE — Patient Instructions (Addendum)
Sent levitra to [facility] [employer]- go there and pay credit card to get it mailed to you for cheap.

## 2022-11-09 ENCOUNTER — Ambulatory Visit: Payer: 59 | Admitting: Internal Medicine

## 2022-11-24 ENCOUNTER — Ambulatory Visit (INDEPENDENT_AMBULATORY_CARE_PROVIDER_SITE_OTHER)
Admission: RE | Admit: 2022-11-24 | Discharge: 2022-11-24 | Disposition: A | Discharge status: Home / Self Care | Payer: 59 | Source: Ambulatory Visit | Attending: Internal Medicine | Admitting: Internal Medicine

## 2022-11-24 DIAGNOSIS — J189 Pneumonia, unspecified organism: Secondary | ICD-10-CM

## 2022-11-26 ENCOUNTER — Encounter: Payer: Self-pay | Admitting: Cardiovascular Disease

## 2022-11-26 ENCOUNTER — Ambulatory Visit: Payer: 59 | Attending: Cardiovascular Disease | Admitting: Cardiovascular Disease

## 2022-11-26 VITALS — BP 150/86 | HR 90 | Ht 69.0 in | Wt 209.0 lb

## 2022-11-26 DIAGNOSIS — I493 Ventricular premature depolarization: Secondary | ICD-10-CM

## 2022-11-26 DIAGNOSIS — I480 Paroxysmal atrial fibrillation: Secondary | ICD-10-CM | POA: Diagnosis not present

## 2022-11-26 NOTE — Patient Instructions (Signed)
Medication Instructions:  Your physician recommends that you continue on your current medications as directed. Please refer to the Current Medication list given to you today.   *If you need a refill on your cardiac medications before your next appointment, please call your pharmacy*   Lab Work: 1 week prior to CT scan: CBC, BMET (you do not need to be fasting) You may come to the lab any time between 7:30am and 4:30pm. If you have labs (blood work) drawn today and your tests are completely normal, you will receive your results only by: [place] (if you have MyChart) OR A paper copy in the mail If you have any lab test that is abnormal or we need to change your treatment, we will call you to review the results.   Testing/Procedures: Your physician has requested that you have cardiac CT. Cardiac computed tomography (CT) is a painless test that uses an x-ray machine to take clear, detailed pictures of your heart. For further information please visit [email or web address]. Please follow instruction sheet as given.   Your physician has recommended that you have an ablation. Catheter ablation is a medical procedure used to treat some cardiac arrhythmias (irregular heartbeats). During catheter ablation, a long, thin, flexible tube is put into a blood vessel in your groin (upper thigh), or neck. This tube is called an ablation catheter. It is then guided to your heart through the blood vessel. Radio frequency waves destroy small areas of heart tissue where abnormal heartbeats may cause an arrhythmia to start.    The EP procedure scheduler, April G, will call you to schedule your ablation and review your instructions with you. Please allow 2-3 weeks for her to contact you.   Follow-Up: At [facility], you and your health needs are our priority.  As part of our continuing mission to provide you with exceptional heart care, we have created designated Provider Care Teams.  These Care Teams  include your primary Cardiologist (physician) and Advanced Practice Providers (APPs -  Physician Assistants and Nurse Practitioners) who all work together to provide you with the care you need, when you need it.   Your next appointment:   4 week(s) after your ablation   Provider:   You will follow up in the [facility] located at [facility]. Your provider will be: Roderic Palau, NP or Clint R. Fenton, PA-C

## 2022-11-26 NOTE — Progress Notes (Signed)
Electrophysiology Office Note:    Date:  11/26/2022   ID:  Jesse Europe [name], DOB 03/19/1961, MRN [id number]  PCP:  Loralee Pacas, MD   [place] Providers Cardiologist:  None Electrophysiologist:  Cristopher Peru, MD     Referring MD: Loralee Pacas, MD   History of Present Illness:    Jesse RETANA [name] is a 62 y.o. male with a hx listed below, significant for atrial fibrillation, referred fby dr. Lovena Le to discuss ablation.  He was diagnosed with AF years ago in the setting of thyrotoxicosis. Despite having this thyroid disease under control, he had recurrence of AF and was placed on amiodarone. He had a DCCV but had early recurrence of AF.   He sometimes feels his AF -- palpitations, fatigue. At present he feels reasonably well.  Past Medical History:  Diagnosis Date   Acute hypoxemic respiratory failure ([place]) 09/18/2022   Allergy    Anxiety    Atrial fibrillation ([facility])    Atrial fibrillation with RVR ([place]) 09/12/2022   Bell palsy    Depression    Diastolic heart failure    HAP (hospital-acquired pneumonia) 09/17/2022   Hypercholesterolemia    Hypertension    Pneumonia    Thyroid disease     Past Surgical History:  Procedure Laterality Date   CARDIOVERSION N/A 09/15/2022   Procedure: CARDIOVERSION;  Surgeon: Werner Lean, MD;  Location: [facility];  Service: Cardiovascular;  Laterality: N/A;   CARDIOVERSION N/A 10/22/2022   Procedure: CARDIOVERSION;  Surgeon: Pixie Casino, MD;  Location[facility];  Service: Cardiovascular;  Laterality: N/A;   CHOLECYSTECTOMY     HERNIA REPAIR     KNEE SURGERY     VASECTOMY      Current Medications: Current Meds  Medication Sig   amiodarone (PACERONE) 200 MG tablet Take 1 tablet (200 mg total) by mouth daily. (Patient taking differently: Take 200-400 mg by mouth See admin instructions. 400 mg twice a day for five day.  200 mg one a day for 25 days)   celecoxib (CELEBREX) 100 MG capsule Take  1 capsule (100 mg total) by mouth 2 (two) times daily.   Cholecalciferol (VITAMIN D-3) 125 MCG (5000 UT) TABS Take 5,000 Units by mouth daily.   cloNIDine (CATAPRES - DOSED IN MG/24 HR) 0.1 mg/24hr patch Place 1 patch (0.1 mg total) onto the skin once a week.   cyclobenzaprine (FLEXERIL) 10 MG tablet Take 1 tablet (10 mg total) by mouth 3 (three) times daily as needed for muscle spasms.   FLUoxetine (PROZAC) 20 MG tablet TAKE 1 TABLET BY MOUTH EVERY DAY   fluticasone (FLONASE) 50 MCG/ACT nasal spray Place 2 sprays into both nostrils daily as needed for allergies.   levothyroxine (SYNTHROID) 200 MCG tablet Take 1 tablet (200 mcg total) by mouth daily. Take as recommended by endocrine.  200 mg daily except for Sunday, take 100 mg. (Patient taking differently: Take 100-200 mcg by mouth See admin instructions. Take as recommended by endocrine.  200 mg daily except for Sunday, take 100 mg.)   losartan (COZAAR) 50 MG tablet Take 1 tablet (50 mg total) by mouth daily.   metoprolol tartrate (LOPRESSOR) 50 MG tablet Take 1 tablet (50 mg total) by mouth 2 (two) times daily.   Multiple Vitamins-Minerals (MULTIVITAMIN WITH MINERALS) tablet Take 1 tablet by mouth daily.   nicotine (NICODERM CQ - DOSED IN MG/24 HOURS) 21 mg/24hr patch Place 1 patch (21 mg total) onto the skin daily.  rivaroxaban (XARELTO) 20 MG TABS tablet Take 1 tablet (20 mg total) by mouth daily with supper.   rosuvastatin (CRESTOR) 40 MG tablet Take 1 tablet (40 mg total) by mouth daily. Replaces 20 mg dose   tamsulosin (FLOMAX) 0.4 MG CAPS capsule Take 1 capsule (0.4 mg total) by mouth daily.   vardenafil (LEVITRA) 20 MG tablet Take 1 tablet (20 mg total) by mouth daily as needed for erectile dysfunction.   vardenafil (LEVITRA) 20 MG tablet Take 1 tablet (20 mg total) by mouth daily as needed for erectile dysfunction.     Allergies:   Sulfa drugs cross reactors, Codeine, and Hydrochlorothiazide   Social and Family History: Reviewed in  Epic  ROS:   Please see the history of present illness.    All other systems reviewed and are negative.  EKGs/Labs/Other Studies Reviewed Today:    Cardiac Studies & Procedures       ECHOCARDIOGRAM  ECHOCARDIOGRAM COMPLETE 09/12/2022  Narrative ECHOCARDIOGRAM REPORT    Patient Name:   Jesse SCHUCHMANN [name] Date of Exam: 09/12/2022 Medical Rec #:  [id number]            Height:       69.0 in Accession #:    [id number]           Weight:       193.0 lb Date of Birth:  [birth year]            BSA:          2.035 m Patient Age:    89 years             BP:           119/92 mmHg Patient Gender: M                    HR:           130 bpm. Exam Location:  Inpatient  Procedure: 2D Echo, Cardiac Doppler and Color Doppler  Indications:    Atrial Fibrillation  History:        Patient has no prior history of Echocardiogram examinations. Diastolic HF, Arrythmias:Atrial Fibrillation; Risk Factors:Hypertension. Graves disease.  Sonographer:    Eartha Inch Referring Phys: Gwyndolyn Kaufman, E   Sonographer Comments: Image acquisition challenging due to patient body habitus. IMPRESSIONS   1. Left ventricular ejection fraction, by estimation, is 40 to 45%. The left ventricle has mildly decreased function. The left ventricle demonstrates global hypokinesis. There is mild concentric left ventricular hypertrophy. Left ventricular diastolic function could not be evaluated. 2. Right ventricular systolic function is normal. The right ventricular size is mildly enlarged. There is normal pulmonary artery systolic pressure. The estimated right ventricular systolic pressure is Q000111Q mmHg. 3. Left atrial size was moderately dilated. 4. Right atrial size was moderately dilated. 5. The mitral valve is normal in structure. Mild mitral valve regurgitation. 6. The aortic valve is tricuspid. Aortic valve regurgitation is not visualized. No aortic stenosis is present. 7. The inferior vena cava is normal  in size with greater than 50% respiratory variability, suggesting right atrial pressure of 3 mmHg.  FINDINGS Left Ventricle: Left ventricular ejection fraction, by estimation, is 40 to 45%. The left ventricle has mildly decreased function. The left ventricle demonstrates global hypokinesis. The left ventricular internal cavity size was normal in size. There is mild concentric left ventricular hypertrophy. Left ventricular diastolic function could not be evaluated due to atrial fibrillation. Left ventricular diastolic function could not be evaluated.  Right Ventricle: The right ventricular size is mildly enlarged. No increase in right ventricular wall thickness. Right ventricular systolic function is normal. There is normal pulmonary artery systolic pressure. The tricuspid regurgitant velocity is 2.14 m/s, and with an assumed right atrial pressure of 3 mmHg, the estimated right ventricular systolic pressure is Q000111Q mmHg.  Left Atrium: Left atrial size was moderately dilated.  Right Atrium: Right atrial size was moderately dilated.  Pericardium: There is no evidence of pericardial effusion.  Mitral Valve: The mitral valve is normal in structure. Mild mitral valve regurgitation.  Tricuspid Valve: The tricuspid valve is normal in structure. Tricuspid valve regurgitation is trivial.  Aortic Valve: The aortic valve is tricuspid. Aortic valve regurgitation is not visualized. No aortic stenosis is present.  Pulmonic Valve: The pulmonic valve was normal in structure. Pulmonic valve regurgitation is not visualized.  Aorta: The aortic root and ascending aorta are structurally normal, with no evidence of dilitation.  Venous: The inferior vena cava is normal in size with greater than 50% respiratory variability, suggesting right atrial pressure of 3 mmHg.  IAS/Shunts: The interatrial septum was not well visualized.   LEFT VENTRICLE PLAX 2D LVIDd:         4.50 cm LVIDs:         3.58 cm LV PW:          1.30 cm LV IVS:        1.30 cm   LEFT ATRIUM         Index LA diam:    4.36 cm 2.14 cm/m TRICUSPID VALVE TR Peak grad:   18.3 mmHg TR Vmax:        214.00 cm/s  Dani Gobble Croitoru MD Electronically signed by Sanda Klein MD Signature Date/Time: 09/12/2022/3:02:12 PM    Final             EKG:  Last EKG results: today - atrial fibrillation, V-rate 90 bpm   Recent Labs: 09/20/2022: B Natriuretic Peptide 411.4 09/23/2022: Magnesium 2.0 10/19/2022: ALT 32; BUN 10; Creatinine, Ser 0.83; Hemoglobin 14.7; Platelets 175; Potassium 3.9; Sodium 140; TSH 0.643     Physical Exam:    VS:  BP (!) 150/86   Pulse 90   Ht '5\' 9"'$  (1.753 m)   Wt 209 lb (94.8 kg)   SpO2 95%   BMI 30.86 kg/m     Wt Readings from Last 3 Encounters:  11/26/22 209 lb (94.8 kg)  11/05/22 210 lb 3.2 oz (95.3 kg)  10/29/22 205 lb 3.2 oz (93.1 kg)     GEN: Well nourished, well developed in no acute distress CARDIAC: iRRR, no murmurs, rubs, gallops RESPIRATORY:  Normal work of breathing MUSCULOSKELETAL: no edema    ASSESSMENT & PLAN:    Persistent AF: failed amiodarone, has mild symptoms. We discussed the indication, rationale, logistics, anticipated benefits, and potential risks of the ablation procedure including but not limited to -- bleed at the groin access site, chest pain, damage to nearby organs such as the diaphragm, lungs, or esophagus, need for a drainage tube, or prolonged hospitalization. I explained that the risk for stroke, heart attack, need for open chest surgery, or even death is very low but not zero. he  expressed understanding and wishes to proceed.  CHFmrEF: on metoprolol, losartan. Rhythm control should help. Hypertension Secondary hypercoagulable state:        Medication Adjustments/Labs and Tests Ordered: Current medicines are reviewed at length with the patient today.  Concerns regarding medicines are outlined above.  No  orders of the defined types were placed in this  encounter.  No orders of the defined types were placed in this encounter.    Signed, Melida Quitter, MD  11/26/2022 12:19 PM    [place]

## 2022-11-30 ENCOUNTER — Other Ambulatory Visit (INDEPENDENT_AMBULATORY_CARE_PROVIDER_SITE_OTHER): Payer: 59

## 2022-11-30 DIAGNOSIS — E890 Postprocedural hypothyroidism: Secondary | ICD-10-CM

## 2022-11-30 DIAGNOSIS — I10 Essential (primary) hypertension: Secondary | ICD-10-CM | POA: Diagnosis not present

## 2022-11-30 LAB — BASIC METABOLIC PANEL
BUN: 12 mg/dL (ref 6–23)
CO2: 30 mEq/L (ref 19–32)
Calcium: 9.1 mg/dL (ref 8.4–10.5)
Chloride: 100 mEq/L (ref 96–112)
Creatinine, Ser: 1.11 mg/dL (ref 0.40–1.50)
GFR: 70.44 mL/min (ref >60.00)
Glucose, Bld: 138 mg/dL — ABNORMAL HIGH (ref 70–99)
Potassium: 3.9 mEq/L (ref 3.5–5.1)
Sodium: 139 mEq/L (ref 135–145)

## 2022-11-30 LAB — T4, FREE: Free T4: 1.52 ng/dL (ref 0.60–1.60)

## 2022-11-30 LAB — TSH: TSH: 0.22 u[IU]/mL — ABNORMAL LOW (ref 0.35–5.50)

## 2022-12-01 ENCOUNTER — Telehealth: Payer: Self-pay

## 2022-12-01 ENCOUNTER — Telehealth: Payer: Self-pay | Admitting: Internal Medicine

## 2022-12-01 DIAGNOSIS — I480 Paroxysmal atrial fibrillation: Secondary | ICD-10-CM

## 2022-12-01 NOTE — Telephone Encounter (Signed)
Pt is scheduled for 03/22/23... Lab Appt: 03/08/23..  I will mail Instruction letters to pt once everything is scheduled.

## 2022-12-01 NOTE — Telephone Encounter (Signed)
Pt is returning call.  

## 2022-12-01 NOTE — Telephone Encounter (Signed)
LM for pt to call back to schedule procedure. 

## 2022-12-03 ENCOUNTER — Ambulatory Visit: Payer: 59 | Admitting: Endocrinology

## 2022-12-03 ENCOUNTER — Encounter: Payer: Self-pay | Admitting: Endocrinology

## 2022-12-03 VITALS — BP 144/100 | HR 98 | Ht 69.0 in | Wt 207.8 lb

## 2022-12-03 DIAGNOSIS — E890 Postprocedural hypothyroidism: Secondary | ICD-10-CM | POA: Diagnosis not present

## 2022-12-03 DIAGNOSIS — I10 Essential (primary) hypertension: Secondary | ICD-10-CM

## 2022-12-03 MED ORDER — LOSARTAN POTASSIUM 100 MG PO TABS: 100.0000 mg | ORAL_TABLET | Freq: Every day | ORAL | 0 refills | Status: DC

## 2022-12-03 NOTE — Patient Instructions (Addendum)
Take  2 of Losartan 50 mg  Thyroid 6 days a week

## 2022-12-03 NOTE — Progress Notes (Signed)
Patient ID: Jesse Europe [name], male   DOB: September 15, 1960, 62 y.o.   MRN: [id number]   Reason for Appointment: Follow-up    History of Present Illness:   The hypothyroidism was first diagnosed in 11/13. Previously had I-131 treatment for Graves' disease He was initially started with 88 mcg but his dose needed to be increased progressively  He had been treated with brand name SYNTHROID, the dose has been adjusted previously based on his labs  He is currently taking 200 mcg generic Synthroid prescription, 6-1/2 tablets a week However his dose has been significantly variable between 125 and 200 mcg  He says he is significantly feeling tired but this is from his cardiac arrhythmia  He takes this before breakfast consistently every day as directed Has not missed any doses lately  TSH has again fluctuated recently Even though it was normal in 2/24 after reducing the dose by half tablet weekly it is now again low Again free T4 levels tend to be relatively higher than expected   Wt Readings from Last 3 Encounters:  12/03/22 207 lb 12.8 oz (94.3 kg)  11/26/22 209 lb (94.8 kg)  11/05/22 210 lb 3.2 oz (95.3 kg)    LABS:   Lab Results  Component Value Date   TSH 0.22 (L) 11/30/2022   TSH 0.643 10/19/2022   TSH 0.26 (L) 08/31/2022   FREET4 1.52 11/30/2022   FREET4 2.27 (H) 10/19/2022   FREET4 1.39 08/31/2022      HYPERTENSION:   He has had long-standing hypertension which is  managed with losartan 50, Catapres 0.1 mg patch and Coreg 1-1/2 tablets twice a day He was told to stop hydrochlorothiazide which was causing hyponatremia  He was evaluated for secondary hypertension and reports of his aldosterone/renin indicate normal levels Also was supposed to get the renal artery ultrasound but this was not done  He has checked his blood pressure regularly at home with the [employer] monitor on his arm  Home his blood pressure is usually normal around 120-130/80 but consistently  higher in doctors offices, was high checked twice today Previously on lisinopril and this was changed from 40 mg lisinopril to 50 mg losartan by PCP  Blood pressure readings recently:  BP Readings from Last 3 Encounters:  12/03/22 (!) 166/98  11/26/22 (!) 150/86  11/05/22 (!) 136/90       Allergies as of 12/03/2022       Reactions   Sulfa Drugs Cross Reactors Hives   Codeine Other (See Comments)   Made him very jittery    Hydrochlorothiazide Other (See Comments)   Hyponatremia        Medication List        Accurate as of December 03, 2022 10:56 AM. If you have any questions, ask your nurse or doctor.          amiodarone 200 MG tablet Commonly known as: PACERONE Take 1 tablet (200 mg total) by mouth daily. What changed:  how much to take when to take this additional instructions   celecoxib 100 MG capsule Commonly known as: CeleBREX Take 1 capsule (100 mg total) by mouth 2 (two) times daily.   cloNIDine 0.1 mg/24hr patch Commonly known as: CATAPRES - Dosed in mg/24 hr Place 1 patch (0.1 mg total) onto the skin once a week.   cyclobenzaprine 10 MG tablet Commonly known as: FLEXERIL Take 1 tablet (10 mg total) by mouth 3 (three) times daily as needed for muscle spasms.   FLUoxetine  20 MG tablet Commonly known as: PROZAC TAKE 1 TABLET BY MOUTH EVERY DAY   fluticasone 50 MCG/ACT nasal spray Commonly known as: FLONASE Place 2 sprays into both nostrils daily as needed for allergies.   levothyroxine 200 MCG tablet Commonly known as: SYNTHROID Take 1 tablet (200 mcg total) by mouth daily. Take as recommended by endocrine.  200 mg daily except for Sunday, take 100 mg. What changed:  how much to take when to take this   losartan 50 MG tablet Commonly known as: COZAAR Take 1 tablet (50 mg total) by mouth daily.   metoprolol tartrate 50 MG tablet Commonly known as: LOPRESSOR Take 1 tablet (50 mg total) by mouth 2 (two) times daily.   multivitamin with  minerals tablet Take 1 tablet by mouth daily.   nicotine 21 mg/24hr patch Commonly known as: NICODERM CQ - dosed in mg/24 hours Place 1 patch (21 mg total) onto the skin daily.   rivaroxaban 20 MG Tabs tablet Commonly known as: XARELTO Take 1 tablet (20 mg total) by mouth daily with supper.   rosuvastatin 40 MG tablet Commonly known as: CRESTOR Take 1 tablet (40 mg total) by mouth daily. Replaces 20 mg dose   tamsulosin 0.4 MG Caps capsule Commonly known as: FLOMAX Take 1 capsule (0.4 mg total) by mouth daily.   vardenafil 20 MG tablet Commonly known as: Levitra Take 1 tablet (20 mg total) by mouth daily as needed for erectile dysfunction.   vardenafil 20 MG tablet Commonly known as: Levitra Take 1 tablet (20 mg total) by mouth daily as needed for erectile dysfunction.   Vitamin D-3 125 MCG (5000 UT) Tabs Take 5,000 Units by mouth daily.        Past Medical History:  Diagnosis Date   Acute hypoxemic respiratory failure ([place]) 09/18/2022   Allergy    Anxiety    Atrial fibrillation (HCC)    Atrial fibrillation with RVR ([place]) 09/12/2022   Bell palsy    Depression    Diastolic heart failure    HAP (hospital-acquired pneumonia) 09/17/2022   Hypercholesterolemia    Hypertension    Pneumonia    Thyroid disease     Past Surgical History:  Procedure Laterality Date   CARDIOVERSION N/A 09/15/2022   Procedure: CARDIOVERSION;  Surgeon: Werner Lean, MD;  Location: [facility];  Service: Cardiovascular;  Laterality: N/A;   CARDIOVERSION N/A 10/22/2022   Procedure: CARDIOVERSION;  Surgeon: Pixie Casino, MD;  Location: [facility];  Service: Cardiovascular;  Laterality: N/A;   CHOLECYSTECTOMY     HERNIA REPAIR     KNEE SURGERY     VASECTOMY      Family History  Problem Relation Age of Onset   Heart disease Mother    Heart attack Mother    Other Mother        thyroid problems   Kidney cancer Mother    Cirrhosis Father    Other Father        thyroid  problems   Alcohol abuse Father    Hypertension Father    Mental illness Brother    Parkinsonism Brother     Social History:  reports that he has been smoking cigarettes. He started smoking about 44 years ago. He has a 20.00 pack-year smoking history. He has never used smokeless tobacco. He reports current alcohol use. He reports that he does not use drugs.  Allergies:  Allergies  Allergen Reactions   Sulfa Drugs Cross Reactors Hives   Codeine Other (  See Comments)    Made him very jittery    Hydrochlorothiazide Other (See Comments)    Hyponatremia    REVIEW of systems:   HYPONATREMIA: His sodium improved with stopping HCTZ  He was told to cut back on drinks like [facility]  He has likely reduced his overall fluid intake as sodium is now consistently normal Probably has had mild SIADH, still taking 20 mg Prozac  CT scan of his chest did not show any tumor  Sodium normal now  Lab Results  Component Value Date   NA 139 11/30/2022   K 3.9 11/30/2022   CL 100 11/30/2022   CO2 30 11/30/2022       Examination:   BP (!) 166/98 (BP Location: Left Arm, Patient Position: Sitting, Cuff Size: Normal)   Pulse 98   Ht 5\' 9"  (1.753 m)   Wt 207 lb 12.8 oz (94.3 kg)   SpO2 94%   BMI 30.69 kg/m   Heart rate is regular, overall about 80    Assessments    HYPERTENSION:   His blood pressure today is significantly high diastolic checked twice Not clear how accurate his home monitor is Since his blood pressure may do better with increasing losartan this will be increased to 100 mg which is equivalent to his previous dose of 40 mg lisinopril He was told to take his home monitor to his PCPs office and make a follow-up appointment  HYPOTHYROIDISM:  secondary to I-131 treatment  His TSH is slightly low with 200 mcg Synthroid 6 days a week and he can now take 6 tablets a week, taking none on Sundays  His fatigue is likely multifactorial including from his cardiac arrhythmia  and depression   There are no Patient Instructions on file for this visit.   [name] 12/03/2022, 10:56 AM

## 2022-12-19 ENCOUNTER — Other Ambulatory Visit: Payer: Self-pay | Admitting: Family Medicine

## 2022-12-19 DIAGNOSIS — N319 Neuromuscular dysfunction of bladder, unspecified: Secondary | ICD-10-CM

## 2023-02-10 ENCOUNTER — Ambulatory Visit: Payer: 59 | Admitting: Internal Medicine

## 2023-02-12 ENCOUNTER — Other Ambulatory Visit: Payer: Self-pay

## 2023-02-12 ENCOUNTER — Telehealth: Payer: Self-pay | Admitting: Internal Medicine

## 2023-02-12 DIAGNOSIS — R0981 Nasal congestion: Secondary | ICD-10-CM

## 2023-02-12 MED ORDER — MONTELUKAST SODIUM 10 MG PO TABS: 10.0000 mg | ORAL_TABLET | Freq: Every day | ORAL | 1 refills | Status: DC

## 2023-02-12 NOTE — Telephone Encounter (Signed)
Rx sent 

## 2023-02-12 NOTE — Telephone Encounter (Signed)
Pt is requesting to change rx from Flonase back to Singulair tablet 10 mg.   Pharm:  [id number] - [place], [place] - [address] [place] [address] Phone: [phone]  Fax: [phone]

## 2023-02-15 ENCOUNTER — Ambulatory Visit: Payer: 59 | Admitting: Internal Medicine

## 2023-02-17 ENCOUNTER — Encounter: Payer: Self-pay | Admitting: Internal Medicine

## 2023-02-17 ENCOUNTER — Ambulatory Visit (INDEPENDENT_AMBULATORY_CARE_PROVIDER_SITE_OTHER): Payer: 59 | Admitting: Internal Medicine

## 2023-02-17 ENCOUNTER — Other Ambulatory Visit: Payer: Self-pay | Admitting: Internal Medicine

## 2023-02-17 VITALS — BP 186/107 | HR 80 | Temp 97.9°F | Ht 69.0 in | Wt 205.2 lb

## 2023-02-17 DIAGNOSIS — I10 Essential (primary) hypertension: Secondary | ICD-10-CM | POA: Diagnosis not present

## 2023-02-17 DIAGNOSIS — M1009 Idiopathic gout, multiple sites: Secondary | ICD-10-CM | POA: Diagnosis not present

## 2023-02-17 DIAGNOSIS — M109 Gout, unspecified: Secondary | ICD-10-CM | POA: Insufficient documentation

## 2023-02-17 DIAGNOSIS — J320 Chronic maxillary sinusitis: Secondary | ICD-10-CM

## 2023-02-17 DIAGNOSIS — R0981 Nasal congestion: Secondary | ICD-10-CM

## 2023-02-17 DIAGNOSIS — S98139A Complete traumatic amputation of one unspecified lesser toe, initial encounter: Secondary | ICD-10-CM

## 2023-02-17 MED ORDER — MONTELUKAST SODIUM 10 MG PO TABS: 10.0000 mg | ORAL_TABLET | Freq: Every day | ORAL | 3 refills | Status: DC

## 2023-02-17 MED ORDER — FLUTICASONE PROPIONATE 50 MCG/ACT NA SUSP: 2.0000 | Freq: Every day | NASAL | 6 refills | Status: DC

## 2023-02-17 MED ORDER — LORATADINE 10 MG PO TABS: 10.0000 mg | ORAL_TABLET | Freq: Every day | ORAL | 11 refills | Status: AC

## 2023-02-17 MED ORDER — AMOXICILLIN-POT CLAVULANATE 875-125 MG PO TABS: 1.0000 | ORAL_TABLET | Freq: Two times a day (BID) | ORAL | 0 refills | Status: DC

## 2023-02-17 MED ORDER — SIMPLY SALINE 0.9 % NA AERS: 2.0000 | INHALATION_SPRAY | NASAL | 11 refills | Status: DC

## 2023-02-17 MED ORDER — PREDNISONE 20 MG PO TABS: ORAL_TABLET | ORAL | 0 refills | Status: DC

## 2023-02-17 MED ORDER — HYDROCODONE-ACETAMINOPHEN 10-325 MG PO TABS: 1.0000 | ORAL_TABLET | Freq: Three times a day (TID) | ORAL | 0 refills | Status: AC | PRN

## 2023-02-17 MED ORDER — ISOSORBIDE MONONITRATE ER 30 MG PO TB24: 30.0000 mg | ORAL_TABLET | Freq: Every day | ORAL | 3 refills | Status: DC

## 2023-02-17 NOTE — Patient Instructions (Addendum)
It was a pleasure seeing you today! Your health and satisfaction are our top priorities.   Glenetta Hew, MD  VISIT SUMMARY:  During your visit, we discussed your foot pain, sinus congestion, high blood pressure, upcoming heart procedure, and chronic foot pain. We believe your foot pain may be due to gout, a type of arthritis that causes painful inflammation in the joints, often in the big toe. Your sinus congestion is likely due to a sinus infection, which is an inflammation of the sinuses often caused by a bacterial infection. We also discussed your high blood pressure, which needs to be controlled before your upcoming heart procedure. Lastly, we talked about your chronic foot pain, which may be due to a bone growth.  YOUR PLAN:  -GOUT: We will start you on Prednisone to reduce inflammation and Hydrocodone for pain control. We will also refer you to a podiatrist for further management and provide dietary advice to help manage your gout. We will follow up in 1 week.  -HIGH BLOOD PRESSURE: We will continue your current blood pressure medications and add Isosorbide Mononitrate if your blood pressure remains high. Please monitor your blood pressure daily at home.  -SINUS INFECTION: We will prescribe Augmentin for your suspected bacterial sinusitis and recommend sinus rinse, Flonase, and Claritin for symptom management. Please avoid Sudafed due to your heart condition. Follow up as needed.  -HEART PROCEDURE: We discussed your upcoming heart procedure and reassured you about the process. We will ensure your blood pressure is controlled prior to the procedure and ask you to stop taking Levitra due to potential drug interactions.  -CHRONIC FOOT PAIN: We will refer you to a podiatrist for further evaluation and management of your chronic foot pain, which may be due to a bone growth from amputation AND gout.  INSTRUCTIONS:  Please start taking the prescribed medications for your gout and sinus  infection. Monitor your blood pressure daily and report any significant changes. Follow the dietary advice for gout management. Avoid Sudafed due to your heart condition. Stop taking Levitra before your heart procedure. Follow up in 1 week for your gout and as needed for your sinus infection.  Next Steps:  [x]  Early Intervention: Schedule sooner appointment, call our on-call services, or go to emergency room if there is Increase in pain or discomfort New or worsening symptoms Sudden or severe changes in your health [x]  Flexible Follow-Up: We recommend a Return in about 1 week (around 02/24/2023) for chronic disease monitoring and management. for optimal routine care. This allows for progress monitoring and treatment adjustments. [x]  Preventive Care: Schedule your annual preventive care visit! It's typically covered by insurance and helps identify potential health issues early. [x]  Lab & X-ray Appointments: Incomplete tests scheduled today, or call to schedule. X-rays: [facility] (M-F, 8:30am-noon or 1pm-5pm). [x]  Medical Information Release: Sign a release form at front desk to obtain relevant medical information we don't have.  Making the Most of Our Focused (20 minute) Appointments:  [x]   Clearly state your top concerns at the beginning of the visit to focus our discussion [x]   If you anticipate you will need more time, please inform the front desk during scheduling - we can book multiple appointments in the same week. [x]   If you have transportation problems- use our convenient video appointments or ask about transportation support. [x]   We can get down to business faster if you use MyChart to update information before the visit and submit non-urgent questions before your visit.  Thank you for taking the time to provide details through MyChart.  Let our nurse know and she can import this information into your encounter documents.  Arrival and Wait Times: [x]   Arriving on time  ensures that everyone receives prompt attention. [x]   Early morning (8a) and afternoon (1p) appointments tend to have shortest wait times. [x]   Unfortunately, we cannot delay appointments for late arrivals or hold slots during phone calls.  Getting Answers and Following Up  [x]   Simple Questions & Concerns: For quick questions or basic follow-up after your visit, reach [place] at [phone] or MyChart messaging. [x]   Complex Concerns: If your concern is more complex, scheduling an appointment might be best. Discuss this with the staff to find the most suitable option. [x]   Lab & Imaging Results: We'll contact you directly if results are abnormal or you don't use MyChart. Most normal results will be on MyChart within 2-3 business days, with a review message from Dr. Jon Billings. Haven't heard back in 2 weeks? Need results sooner? Contact us at [phone]. [x]   Referrals: Our referral coordinator will manage specialist referrals. The specialist's office should contact you within 2 weeks to schedule an appointment. Call us if you haven't heard from them after 2 weeks.  Staying Connected  [x]   MyChart: Activate your MyChart for the fastest way to access results and message [place]. See the last page of this paperwork for instructions on how to activate.  Bring to Your Next Appointment  [x]   Medications: Please bring all your medication bottles to your next appointment to ensure we have an accurate record of your prescriptions. [x]   Health Diaries: If you're monitoring any health conditions at home, keeping a diary of your readings can be very helpful for discussions at your next appointment.  Billing  [x]   X-ray & Lab Orders: These are billed by separate companies. Contact the invoicing company directly for questions or concerns. [x]   Visit Charges: Discuss any billing inquiries with our administrative services team.  Your Satisfaction Matters  [x]   Share Your Experience: We strive for your  satisfaction! If you have any complaints, or preferably compliments, please let Dr. Jon Billings know directly or contact our Practice Administrators, Edwena Felty or [employer], by asking at the front desk.   Reviewing Your Records  [x]   Review this early draft of your clinical encounter notes below and the final encounter summary tomorrow on MyChart after its been completed.   Essential hypertension Assessment & Plan: Uncontrolled in office, maybe due to gout flare. add imdur for any blood pressure over 140/90 to stay on track for surgery.  Orders: -     Isosorbide Mononitrate ER; Take 1 tablet (30 mg total) by mouth daily. Cannot take levitra with.  Dispense: 90 tablet; Refill: 3  Acute idiopathic gout of multiple sites -     predniSONE; Take 2 pills for 3 days, 1 pill for 4 days  Dispense: 10 tablet; Refill: 0 -     HYDROcodone-Acetaminophen; Take 1 tablet by mouth every 8 (eight) hours as needed for up to 5 days.  Dispense: 15 tablet; Refill: 0 -     Ambulatory referral to Podiatry  Amputated toe, unspecified laterality (HCC)  Chronic maxillary sinusitis -     Fluticasone Propionate; Place 2 sprays into both nostrils daily.  Dispense: 16 g; Refill: 6 -     Simply Saline; Place 2 each into the nose as directed. Use nightly for sinus hygiene long-term.  Can also be  used as many times daily as desired to assist with clearing congested sinuses.  Dispense: 127 mL; Refill: 11 -     Loratadine; Take 1 tablet (10 mg total) by mouth daily.  Dispense: 30 tablet; Refill: 11 -     Amoxicillin-Pot Clavulanate; Take 1 tablet by mouth 2 (two) times daily.  Dispense: 20 tablet; Refill: 0

## 2023-02-17 NOTE — Progress Notes (Signed)
[facility]: [phone]   Routine Medical Office Visit  Patient:  Jesse Suarez.      Age: 62 y.o.       Sex:  male  Date:   02/17/2023 PCP:    Lula Olszewski, MD   Today's Healthcare Provider: Lula Olszewski, MD   Assessment and Plan:   Based on [employer] AI conversational text extraction: Assessment and Plan    Gout: Bilateral foot pain, starting at the base of the big toe and spreading around the outside of the foot. Likely gout given the location and pain with massage. No prior history of gout. No skin breakdown or signs of infection on physical exam. -Start Prednisone for inflammation. -Prescribe Hydrocodone for pain control due to inability to take anti-inflammatory medications due to drug interactions. -Refer to a podiatrist for further management. -Provide dietary advice for gout management. -Follow-up in 1 week.  Hypertension: Elevated blood pressure, potentially due to pain and stress from gout and sinus infection. Blood pressure needs to be controlled prior to upcoming cardiac ablation procedure. -Continue current antihypertensive medications. -Add Isosorbide Mononitrate if blood pressure remains above 140/90. -Monitor blood pressure daily at home.  Chronic Sinusitis: Symptoms of sinus congestion and ear clogging for about a week. Tenderness over sinuses on physical exam. Likely sinus infection. -Prescribe Augmentin for suspected bacterial sinusitis. -Recommend sinus rinse, Flonase, and Claritin for symptom management. -Advise against use of Sudafed due to heart condition. -Follow-up as needed.  Cardiac Ablation: Scheduled for cardiac ablation procedure. Patient expresses some anxiety about the procedure. -Reassure patient about the procedure. -Ensure blood pressure is controlled prior to procedure. -Stop Levitra prior to procedure due to potential drug interactions.  Foot Pain: Chronic foot pain, potentially due to bone growth.  Patient expresses desire for surgical intervention. -Refer to a podiatrist for further evaluation and management.       Based on updated problems and orders placed with problem based charting:  Assessment and Plan [name] was seen today for foot pain, head congestion and ears clogged.  Essential hypertension Overview: Interim history from February 17, 2023:    Home readings: 130s/80's at home but high here. Patient reports taking current medications consistently and not experiencing any significant associated side effects or symptoms. Current hypertension medications:       Sig   cloNIDine (CATAPRES - DOSED IN MG/24 HR) 0.1 mg/24hr patch (Taking) Place 1 patch (0.1 mg total) onto the skin once a week.   losartan (COZAAR) 100 MG tablet (Taking) Take 1 tablet (100 mg total) by mouth daily.   metoprolol tartrate (LOPRESSOR) 50 MG tablet Take 1 tablet (50 mg total) by mouth 2 (two) times daily.      Lab Results  Component Value Date   NA 139 11/30/2022   K 3.9 11/30/2022   CREATININE 1.11 11/30/2022   GFR 70.44 11/30/2022     Assessment & Plan: Uncontrolled in office, maybe due to gout flare. add imdur for any blood pressure over 140/90 to stay on track for surgery.  Orders: -     Isosorbide Mononitrate ER; Take 1 tablet (30 mg total) by mouth daily. Cannot take levitra with.  Dispense: 90 tablet; Refill: 3  Acute idiopathic gout of multiple sites -     predniSONE; Take 2 pills for 3 days, 1 pill for 4 days  Dispense: 10 tablet; Refill: 0 -     HYDROcodone-Acetaminophen; Take 1 tablet by mouth every 8 (eight) hours as needed for up  to 5 days.  Dispense: 15 tablet; Refill: 0 -     Ambulatory referral to Podiatry  Amputated toe, unspecified laterality (HCC) -     Ambulatory referral to Podiatry  Chronic maxillary sinusitis -     Fluticasone Propionate; Place 2 sprays into both nostrils daily.  Dispense: 16 g; Refill: 6 -     Simply Saline; Place 2 each into the nose as directed.  Use nightly for sinus hygiene long-term.  Can also be used as many times daily as desired to assist with clearing congested sinuses.  Dispense: 127 mL; Refill: 11 -     Loratadine; Take 1 tablet (10 mg total) by mouth daily.  Dispense: 30 tablet; Refill: 11 -     Amoxicillin-Pot Clavulanate; Take 1 tablet by mouth 2 (two) times daily.  Dispense: 20 tablet; Refill: 0    Treatment plan discussed and reviewed in detail. Explained medication safety and potential side effects.  Answered all patient questions and confirmed understanding and comfort with the plan. Encouraged patient to contact our office if they have any questions or concerns. Agreed on patient returning to office if symptoms worsen, persist, or new symptoms develop. Discussed precautions in case of needing to visit the Emergency Department.         Clinical Presentation:    62 y.o. male here today for Foot Pain (For two or three weeks on the side of the right foot.), Head congestion, and Ears clogged  Based on Abridge AI conversational text extraction:  Discussed the use of AI scribe software for clinical note transcription with the patient, who gave verbal consent to proceed.  History of Present Illness   The patient presents with pain in both feet, extending from the fifth pinky toe to the heel, with the pain being more pronounced on the outside of the foot. The discomfort began at the base of the big toe and spread around the foot. The patient reports that massaging the area exacerbates the pain. This is the first occurrence of such symptoms, and the patient has no prior history of gout.  In addition to the foot pain, the patient also reports a week-long history of head congestion and ear clogging, with no associated fever. The patient has been experiencing pressure in the ears and has been blowing his nose frequently. The patient also reports tenderness in the sinus area upon palpation.  The patient also mentions a scheduled lung  scan and an upcoming ablation procedure for a heart condition. He expresses some anxiety about the ablation procedure.  The patient has a history of diabetes and has lost three toes on one foot due to the condition. He also reports a bone growth on one foot that causes discomfort.  The patient is currently on multiple medications, including Metabolol, amiodarone, and blood thinners. He also has a known sulfa allergy. The patient has been checking his blood pressure at home, which has been consistently around 130/80 to 130/90.  The patient's lifestyle is currently stressful, with his spouse in a nursing home and his son going through a divorce. The patient denies alcohol consumption and has a preference for minimal medication use.      Reviewed chart data: Active Ambulatory Problems    Diagnosis Date Noted   Paroxysmal atrial fibrillation with RVR (HCC) 01/15/2011   Hypothyroidism following radioiodine therapy 01/15/2011   Essential hypertension 01/15/2011   Dyslipidemia 01/15/2011   Depression with anxiety 02/20/2012   ED (erectile dysfunction) 02/20/2012   Hx of Bell's  palsy 02/20/2012   Amputated toe (HCC) 02/20/2012   Cigarette smoker 08/07/2013   Bone neoplasm 02/25/2015   PVC's (premature ventricular contractions) 01/06/2017   COPD GOLD 0/ emphysema on CT  03/15/2019   Vitamin D deficiency 03/31/2022   Pneumonia of right lower lobe due to infectious organism 09/17/2022   Iron deficiency anemia 09/18/2022   Tobacco use disorder 09/19/2022   Mood disorder (HCC) 09/19/2022   Prolonged QT interval 09/20/2022   Lumbar degenerative disc disease 10/08/2022   Bladder dysfunction 10/08/2022   Seasonal allergies 10/08/2022   Major depressive disorder, single episode, in remission (HCC) 10/08/2022   Acute idiopathic gout of multiple sites 02/17/2023   Resolved Ambulatory Problems    Diagnosis Date Noted   History of atrial fibrillation 10/16/2014   Bacterial sinusitis 12/08/2016    Visit for preventive health examination 02/03/2018   Prostate cancer screening 02/03/2018   Colon cancer screening 02/03/2018   Need for Tdap vaccination 02/03/2018   Tick bite of abdomen 02/03/2018   AKI (acute kidney injury) (HCC) 05/09/2022   URI (upper respiratory infection) 09/11/2022   Atrial fibrillation with RVR (HCC) 09/11/2022   Atrial fibrillation with RVR (HCC) 09/12/2022   Chronic atrial fibrillation with RVR (HCC) 09/18/2022   Severe sepsis (HCC) 09/18/2022   Acute hypoxemic respiratory failure (HCC) 09/18/2022   Cough with hemoptysis 09/19/2022   Nausea without vomiting 09/20/2022   Sore throat 09/20/2022   Diarrhea 10/08/2022   Past Medical History:  Diagnosis Date   Allergy    Anxiety    Atrial fibrillation (HCC)    Bell palsy    Depression    Diastolic heart failure    HAP (hospital-acquired pneumonia) 09/17/2022   Hypercholesterolemia    Hypertension    Pneumonia    Thyroid disease     Outpatient Medications Prior to Visit  Medication Sig   amiodarone (PACERONE) 200 MG tablet Take 1 tablet (200 mg total) by mouth daily. (Patient taking differently: Take 200-400 mg by mouth See admin instructions. 400 mg twice a day for five day.  200 mg one a day for 25 days)   celecoxib (CELEBREX) 100 MG capsule Take 1 capsule (100 mg total) by mouth 2 (two) times daily.   Cholecalciferol (VITAMIN D-3) 125 MCG (5000 UT) TABS Take 5,000 Units by mouth daily.   cloNIDine (CATAPRES - DOSED IN MG/24 HR) 0.1 mg/24hr patch Place 1 patch (0.1 mg total) onto the skin once a week.   cyclobenzaprine (FLEXERIL) 10 MG tablet Take 1 tablet (10 mg total) by mouth 3 (three) times daily as needed for muscle spasms.   FLUoxetine (PROZAC) 20 MG tablet TAKE 1 TABLET BY MOUTH EVERY DAY   levothyroxine (SYNTHROID) 200 MCG tablet Take 1 tablet (200 mcg total) by mouth daily. Take as recommended by endocrine.  200 mg daily except for Sunday, take 100 mg. (Patient taking differently: Take 100-200  mcg by mouth See admin instructions. Take as recommended by endocrine.  200 mg daily except for Sunday, take 100 mg.)   losartan (COZAAR) 100 MG tablet Take 1 tablet (100 mg total) by mouth daily.   montelukast (SINGULAIR) 10 MG tablet Take 1 tablet (10 mg total) by mouth at bedtime. TAKE 1 TABLET BY MOUTH EVERYDAY AT BEDTIME Strength: 10 mg   Multiple Vitamins-Minerals (MULTIVITAMIN WITH MINERALS) tablet Take 1 tablet by mouth daily.   nicotine (NICODERM CQ - DOSED IN MG/24 HOURS) 21 mg/24hr patch Place 1 patch (21 mg total) onto the skin daily.  rivaroxaban (XARELTO) 20 MG TABS tablet Take 1 tablet (20 mg total) by mouth daily with supper.   rosuvastatin (CRESTOR) 40 MG tablet Take 1 tablet (40 mg total) by mouth daily. Replaces 20 mg dose   tamsulosin (FLOMAX) 0.4 MG CAPS capsule Take 1 capsule (0.4 mg total) by mouth daily.   [DISCONTINUED] vardenafil (LEVITRA) 20 MG tablet Take 1 tablet (20 mg total) by mouth daily as needed for erectile dysfunction.   [DISCONTINUED] vardenafil (LEVITRA) 20 MG tablet Take 1 tablet (20 mg total) by mouth daily as needed for erectile dysfunction.   fluticasone (FLONASE) 50 MCG/ACT nasal spray Place 2 sprays into both nostrils daily as needed for allergies. (Patient not taking: Reported on 02/17/2023)   metoprolol tartrate (LOPRESSOR) 50 MG tablet Take 1 tablet (50 mg total) by mouth 2 (two) times daily.   No facility-administered medications prior to visit.         Clinical Data Analysis:   Physical Exam  BP (!) 186/107 (BP Location: Right Arm, Patient Position: Sitting)   Pulse 80   Temp 97.9 F (36.6 C) (Temporal)   Ht 5\' 9"  (1.753 m)   Wt 205 lb 3.2 oz (93.1 kg)   SpO2 94%   BMI 30.30 kg/m  Wt Readings from Last 10 Encounters:  02/17/23 205 lb 3.2 oz (93.1 kg)  12/03/22 207 lb 12.8 oz (94.3 kg)  11/26/22 209 lb (94.8 kg)  11/05/22 210 lb 3.2 oz (95.3 kg)  10/29/22 205 lb 3.2 oz (93.1 kg)  10/22/22 197 lb (89.4 kg)  10/08/22 199 lb 3.2 oz (90.4  kg)  10/07/22 197 lb 3.2 oz (89.4 kg)  09/23/22 188 lb 9.6 oz (85.5 kg)  09/11/22 193 lb (87.5 kg)   Vital signs reviewed.  Nursing notes reviewed. Weight trend reviewed. Abnormalities and Problem-Specific physical exam findings:  sinus congestion  pain on lateral aspects of both feet with tenderness. General Appearance:  No acute distress appreciable.   Well-groomed, healthy-appearing male.  Well proportioned with no abnormal fat distribution.  Good muscle tone. Skin: Clear and well-hydrated. Pulmonary:  Normal work of breathing at rest, no respiratory distress apparent. SpO2: 94 %  Musculoskeletal: toe multi amputated partially with callus and tenderness,  Neurological:  Awake, alert, oriented, and engaged.  No obvious focal neurological deficits or cognitive impairments.  Sensorium seems unclouded.   Speech is clear and coherent with logical content. Psychiatric:  Appropriate mood, pleasant and cooperative demeanor, thoughtful and engaged during the exam  Results Reviewed:    No results found for any visits on 02/17/23.  Recent Results (from the past 2160 hour(s))  T4, free     Status: None   Collection Time: 11/30/22 10:29 AM  Result Value Ref Range   Free T4 1.52 0.60 - 1.60 ng/dL    Comment: Specimens from patients who are undergoing biotin therapy and /or ingesting biotin supplements may contain high levels of biotin.  The higher biotin concentration in these specimens interferes with this Free T4 assay.  Specimens that contain high levels  of biotin may cause false high results for this Free T4 assay.  Please interpret results in light of the total clinical presentation of the patient.    TSH     Status: Abnormal   Collection Time: 11/30/22 10:29 AM  Result Value Ref Range   TSH 0.22 (L) 0.35 - 5.50 uIU/mL  Basic metabolic panel     Status: Abnormal   Collection Time: 11/30/22 10:29 AM  Result Value Ref Range  Sodium 139 135 - 145 mEq/L   Potassium 3.9 3.5 - 5.1 mEq/L    Chloride 100 96 - 112 mEq/L   CO2 30 19 - 32 mEq/L   Glucose, Bld 138 (H) 70 - 99 mg/dL   BUN 12 6 - 23 mg/dL   Creatinine, Ser 8.41 0.40 - 1.50 mg/dL   GFR 32.44 >01.02 mL/min    Comment: Calculated using the CKD-EPI Creatinine Equation (2021)   Calcium 9.1 8.4 - 10.5 mg/dL    No image results found.   DG Chest 2 View  Result Date: 11/26/2022 CLINICAL DATA:  62 year old male with preoperative chest x-ray and prior pneumonia EXAM: CHEST - 2 VIEW COMPARISON:  09/11/2022, 09/17/2022, 05/25/2022 FINDINGS: Cardiomediastinal silhouette unchanged in size and contour. Improving aeration of the lungs. There are persisting mild bilateral mixed interstitial and airspace opacities which are more prominent than the baseline plain film of 05/25/2022. No pneumothorax or pleural effusion. Degenerative changes of the spine.  No displaced fracture IMPRESSION: Improved appearance of the chest x-ray, though with incomplete clearing of infection as compared to the baseline chest x-ray of 05/25/2022. Electronically Signed   By: Gilmer Mor D.O.   On: 11/26/2022 15:52       Signed: Lula Olszewski, MD 02/17/2023 12:32 PM

## 2023-02-17 NOTE — Assessment & Plan Note (Signed)
Uncontrolled in office, maybe due to gout flare. add imdur for any blood pressure over 140/90 to stay on track for surgery.

## 2023-02-24 ENCOUNTER — Other Ambulatory Visit: Payer: Self-pay | Admitting: Internal Medicine

## 2023-02-24 DIAGNOSIS — I10 Essential (primary) hypertension: Secondary | ICD-10-CM

## 2023-02-25 ENCOUNTER — Ambulatory Visit (INDEPENDENT_AMBULATORY_CARE_PROVIDER_SITE_OTHER): Payer: 59 | Admitting: Internal Medicine

## 2023-02-25 VITALS — BP 160/100 | HR 108 | Temp 97.3°F | Ht 69.0 in | Wt 208.6 lb

## 2023-02-25 DIAGNOSIS — E890 Postprocedural hypothyroidism: Secondary | ICD-10-CM

## 2023-02-25 DIAGNOSIS — M1009 Idiopathic gout, multiple sites: Secondary | ICD-10-CM | POA: Diagnosis not present

## 2023-02-25 DIAGNOSIS — Z0000 Encounter for general adult medical examination without abnormal findings: Secondary | ICD-10-CM

## 2023-02-25 DIAGNOSIS — I1A0 Resistant hypertension: Secondary | ICD-10-CM

## 2023-02-25 MED ORDER — METOPROLOL TARTRATE 50 MG PO TABS: 50.0000 mg | ORAL_TABLET | Freq: Two times a day (BID) | ORAL | 3 refills | Status: DC

## 2023-02-25 MED ORDER — SPIRONOLACTONE 25 MG PO TABS: 25.0000 mg | ORAL_TABLET | Freq: Every day | ORAL | 3 refills | Status: DC

## 2023-02-25 MED ORDER — TORSEMIDE 10 MG PO TABS: 10.0000 mg | ORAL_TABLET | Freq: Every day | ORAL | 3 refills | Status: DC

## 2023-02-25 NOTE — Progress Notes (Signed)
[facility]: [phone]   Routine Medical Office Visit  Patient:  Jesse DUBAY [name]      Age: 62 y.o.       Sex:  male  Date:   02/25/2023 PCP:    Lula Olszewski, MD   Today's Healthcare Provider: Lula Olszewski, MD   Assessment and Plan:    [name] was seen today for one week follow-up, hypertension and medication refill.  Postablative hypothyroidism Overview: Associated with amiodarone for atrial fibrillation On synthroid at least since 2013  Endocrinology Dr. Reather Littler managed  06/21/12 to 02/2023, then retired     Latest Ref Rng & Units 11/30/2022   10:29 AM 10/19/2022   10:03 AM 08/31/2022    9:23 AM 07/06/2022   10:26 AM 05/09/2022   10:35 AM 03/09/2022    4:04 PM 12/03/2021    9:32 AM  THYROID  TSH 0.35 - 5.50 uIU/mL 0.22  0.643  0.26  8.41  0.338  9.63  0.30   T4,Free(Direct) 0.60 - 1.60 ng/dL 0.98  1.19  1.47  8.29  1.56  1.01  1.29    anti-TPO antibodies: N/A  anti-Tg antibodies: N/A  Anti-TSI antibodies: N/A   Family history of thyroid disease: Patient last evaluated for thyroid nodules: Last ultrasound for thyroid nodules: N/A  Last heart exam for rhythm check:    Assessment & Plan: Referring care to endocrine since Dr. Lucianne Muss retiring, but he asks me to hold off until he can get more info from Dr. Lucianne Muss  Hypothyroidism: His hypothyroidism, likely secondary to prior Amiodarone treatment for AFib, is currently managed with Synthroid. We will refer him to Dr. Sula Rumple, Endocrinologist, for further management.   Resistant hypertension Overview: Interim history from February 17, 2023:    Home readings: high at home and high here too. Patient reports taking current medications consistently and not experiencing any significant associated side effects or symptoms. Current hypertension medications:       Sig   cloNIDine (CATAPRES - DOSED IN MG/24 HR) 0.1 mg/24hr patch (Taking) Place 1 patch (0.1 mg total) onto the skin once a  week.   losartan (COZAAR) 100 MG tablet (Taking) Take 1 tablet (100 mg total) by mouth daily.   metoprolol tartrate (LOPRESSOR) 50 MG tablet Take 1 tablet (50 mg total) by mouth 2 (two) times daily.      Lab Results  Component Value Date   NA 139 11/30/2022   K 3.9 11/30/2022   CREATININE 1.11 11/30/2022   GFR 70.44 11/30/2022     Assessment & Plan: Poorly controlled despite 3 medication(s) - reclassified as resistant and planned workup accordingly:  Renal function tests (serum creatinine, BUN, and eGFR) to assess kidney function. Aldosterone-renin ratio to evaluate for primary aldosteronism. Urinary metanephrines and catecholamines to exclude pheochromocytoma. add spironolactone and demadex to balance diuresis., though he is euvolemic, allergic to hydrochloroTHIAZIDE Consider nitrates.  Medication Refill: He requests a refill of Metoprolol. We will refill Metoprolol 50mg  BID, providing a 90-day supply with three refills. Hypertension: He exhibits resistant hypertension with blood pressure readings consistently in the 160s despite current treatment with Clonidine, Losartan, and Metoprolol, and reports no side effects from these medications. He also denies significant salt or alcohol intake. We will add Spironolactone and Torsemide to his regimen, order a 24-hour urine collection to assess for pheochromocytoma, consider a sleep study for obstructive sleep apnea, and recheck his blood pressure in 2 weeks.  Reviewed available data from patient and  BP  Readings from Last 3 Encounters:  02/25/23 (!) 160/100  02/17/23 (!) 186/107  12/03/22 (!) 144/100   My individualized, goal average blood pressure for this patient, after considering the evidence for and against aggressive blood pressure goals as well as their past medical history and preferences, is 140/90 In my medical opinion, this problem is stable, poorly controlled  Increased medication(s) prescribed after collaborative review.  Following informed consent, we adjusted the medication regimen as per documented orders to now be:  Current hypertension medications:       Sig   cloNIDine (CATAPRES - DOSED IN MG/24 HR) 0.1 mg/24hr patch (Taking) Place 1 patch (0.1 mg total) onto the skin once a week.   losartan (COZAAR) 100 MG tablet (Taking) Take 1 tablet (100 mg total) by mouth daily.   metoprolol tartrate (LOPRESSOR) 50 MG tablet Take 1 tablet (50 mg total) by mouth 2 (two) times daily.     We explained the expected benefits and potential side effects of the new medications and encouraged the patient to report any concerns. Information for patient review: Please limit and avoid: salt, alcohol, NSAIDS, excess body weight, smoking, stress, sedentary lifestyles The risks of poor control over time are FUTURE stroke and heart attacks, but if you have a blood pressure over 180 and any red flag symptoms(headache/shortness of breath/confusion/chest discomfort) you should go to the ER.  You are encouraged to do home blood pressure monitoring, at least as many times per week as blood pressure medications you are on.  For example, bring a diary with 3 home blood pressure readings per week to each visit if you are on 3 blood pressure medications.   See AFTER VISIT SUMMARY for addition educational information provided Please let me know in advance when you need medication(s) refills, consistently taking your medication is very important!   Orders: -     Metoprolol Tartrate; Take 1 tablet (50 mg total) by mouth 2 (two) times daily.  Dispense: 180 tablet; Refill: 3 -     Torsemide; Take 1 tablet (10 mg total) by mouth daily.  Dispense: 90 tablet; Refill: 3 -     Spironolactone; Take 1 tablet (25 mg total) by mouth daily.  Dispense: 90 tablet; Refill: 3 -     Basic metabolic panel -     Aldosterone + renin activity w/ ratio -     Metanephrines, urine, 24 hour  Acute idiopathic gout of multiple sites Overview: Flare 02/2023 resolved  with prednisone, podagra No results found for: "LABURIC" Not on allopurinol   Assessment & Plan: Foot Pain: Improvement is noted with Prednisone treatment. We will continue the current management plan. No results found for: "LABURIC"  but no need for labwork today- will check with next routine blood draw Has celecoxib for pain. Put problem and diet information in AVS     Healthcare maintenance Assessment & Plan: February 26, 2023 needs we didn't get to discuss: Eligible for Lung Cancer Screening Tobacco counseling needed Depression Monitoring Refresh HCC Diagnosis  General Health Maintenance: He should continue his current medications, including Flomax and Xarelto, and keep monitoring his blood pressure at home. He will return for a follow-up in 2 weeks to reassess blood pressure control.                 Clinical Presentation:    62 y.o. male here today for One week follow-up, Hypertension, and Medication Refill (Metoprolol.)  AI-Extracted: Discussed the use of AI scribe software for clinical note transcription with  the patient, who gave verbal consent to proceed.  History of Present Illness   The patient, with a history of hypertension, atrial fibrillation, and thyroid disease, presents for a follow-up visit. He reports taking Synthroid for a long-standing thyroid condition, which was not previously documented in his problem list.  The patient's blood pressure readings have been consistently high, both at home and in the clinic, despite being on three antihypertensive medications (clonidine, losartan, and metoprolol). He denies experiencing any side effects from these medications.  The patient also reports a recent improvement in foot pain, which he attributes to a course of prednisone. He is nearing the end of this course and does not believe he needs any more. He continues to take Flomax and Xarelto, and is also on an antibiotic, Augmentin.  The patient has been  experiencing urinary frequency, waking up three times a night to urinate. Despite these symptoms, he denies any dysuria or hematuria.  The patient has been referred to an endocrinologist, Dr. Elvera Lennox, due to his current provider's impending retirement. He is also due for a lung x-ray and blood work, and has an upcoming appointment with a cardiologist.  The patient denies any significant dietary intake of salt or alcohol, and does not take ibuprofen. He reports occasional consumption of beer and denies eating a lot of bacon or sausage. He also reports a recent desire for fatback, which he has not acted upon.  The patient's lifestyle includes activities such as cutting grass. He recently acquired a new lawnmower, which he has been using regularly. He also reports an ongoing issue with a dog that likes to bite, which he manages by using a broom.        Reviewed chart data: Active Ambulatory Problems    Diagnosis Date Noted   Paroxysmal atrial fibrillation with RVR (HCC) 01/15/2011   Resistant hypertension 01/15/2011   Dyslipidemia 01/15/2011   Depression with anxiety 02/20/2012   ED (erectile dysfunction) 02/20/2012   Hx of Bell's palsy 02/20/2012   Amputated toe (HCC) 02/20/2012   Cigarette smoker 08/07/2013   Bone neoplasm 02/25/2015   PVC's (premature ventricular contractions) 01/06/2017   COPD GOLD 0/ emphysema on CT  03/15/2019   Vitamin D deficiency 03/31/2022   Pneumonia of right lower lobe due to infectious organism 09/17/2022   Iron deficiency anemia 09/18/2022   Tobacco use disorder 09/19/2022   Mood disorder (HCC) 09/19/2022   Prolonged QT interval 09/20/2022   Lumbar degenerative disc disease 10/08/2022   Bladder dysfunction 10/08/2022   Seasonal allergies 10/08/2022   Major depressive disorder, single episode, in remission (HCC) 10/08/2022   Acute idiopathic gout of multiple sites 02/17/2023   Hypothyroidism 01/15/2011   Healthcare maintenance 02/26/2023   Resolved  Ambulatory Problems    Diagnosis Date Noted   History of atrial fibrillation 10/16/2014   Bacterial sinusitis 12/08/2016   Visit for preventive health examination 02/03/2018   Prostate cancer screening 02/03/2018   Colon cancer screening 02/03/2018   Need for Tdap vaccination 02/03/2018   Tick bite of abdomen 02/03/2018   AKI (acute kidney injury) (HCC) 05/09/2022   URI (upper respiratory infection) 09/11/2022   Atrial fibrillation with RVR (HCC) 09/11/2022   Atrial fibrillation with RVR (HCC) 09/12/2022   Chronic atrial fibrillation with RVR (HCC) 09/18/2022   Severe sepsis (HCC) 09/18/2022   Acute hypoxemic respiratory failure (HCC) 09/18/2022   Cough with hemoptysis 09/19/2022   Nausea without vomiting 09/20/2022   Sore throat 09/20/2022   Diarrhea 10/08/2022   Past Medical  History:  Diagnosis Date   Allergy    Anxiety    Atrial fibrillation (HCC)    Bell palsy    Depression    Diastolic heart failure    HAP (hospital-acquired pneumonia) 09/17/2022   Hypercholesterolemia    Hypertension    Pneumonia    Thyroid disease     Outpatient Medications Prior to Visit  Medication Sig   amiodarone (PACERONE) 200 MG tablet Take 1 tablet (200 mg total) by mouth daily. (Patient taking differently: Take 200-400 mg by mouth See admin instructions. 400 mg twice a day for five day.  200 mg one a day for 25 days)   amoxicillin-clavulanate (AUGMENTIN) 875-125 MG tablet Take 1 tablet by mouth 2 (two) times daily.   celecoxib (CELEBREX) 100 MG capsule Take 1 capsule (100 mg total) by mouth 2 (two) times daily.   Cholecalciferol (VITAMIN D-3) 125 MCG (5000 UT) TABS Take 5,000 Units by mouth daily.   cloNIDine (CATAPRES - DOSED IN MG/24 HR) 0.1 mg/24hr patch Place 1 patch (0.1 mg total) onto the skin once a week.   cyclobenzaprine (FLEXERIL) 10 MG tablet Take 1 tablet (10 mg total) by mouth 3 (three) times daily as needed for muscle spasms.   FLUoxetine (PROZAC) 20 MG tablet TAKE 1 TABLET BY  MOUTH EVERY DAY   fluticasone (FLONASE) 50 MCG/ACT nasal spray Place 2 sprays into both nostrils daily as needed for allergies.   fluticasone (FLONASE) 50 MCG/ACT nasal spray Place 2 sprays into both nostrils daily.   isosorbide mononitrate (IMDUR) 30 MG 24 hr tablet Take 1 tablet (30 mg total) by mouth daily. Cannot take levitra with.   levothyroxine (SYNTHROID) 200 MCG tablet Take 1 tablet (200 mcg total) by mouth daily. Take as recommended by endocrine.  200 mg daily except for Sunday, take 100 mg. (Patient taking differently: Take 100-200 mcg by mouth See admin instructions. Take as recommended by endocrine.  200 mg daily except for Sunday, take 100 mg.)   loratadine (CLARITIN) 10 MG tablet Take 1 tablet (10 mg total) by mouth daily.   losartan (COZAAR) 100 MG tablet Take 1 tablet (100 mg total) by mouth daily.   montelukast (SINGULAIR) 10 MG tablet Take 1 tablet (10 mg total) by mouth at bedtime. TAKE 1 TABLET BY MOUTH EVERYDAY AT BEDTIME Strength: 10 mg   Multiple Vitamins-Minerals (MULTIVITAMIN WITH MINERALS) tablet Take 1 tablet by mouth daily.   nicotine (NICODERM CQ - DOSED IN MG/24 HOURS) 21 mg/24hr patch Place 1 patch (21 mg total) onto the skin daily.   rivaroxaban (XARELTO) 20 MG TABS tablet Take 1 tablet (20 mg total) by mouth daily with supper.   rosuvastatin (CRESTOR) 40 MG tablet Take 1 tablet (40 mg total) by mouth daily. Replaces 20 mg dose   Saline (SIMPLY SALINE) 0.9 % AERS Place 2 each into the nose as directed. Use nightly for sinus hygiene long-term.  Can also be used as many times daily as desired to assist with clearing congested sinuses.   tamsulosin (FLOMAX) 0.4 MG CAPS capsule Take 1 capsule (0.4 mg total) by mouth daily.   [DISCONTINUED] predniSONE (DELTASONE) 20 MG tablet Take 2 pills for 3 days, 1 pill for 4 days   [DISCONTINUED] metoprolol tartrate (LOPRESSOR) 50 MG tablet Take 1 tablet (50 mg total) by mouth 2 (two) times daily.   No facility-administered  medications prior to visit.         Clinical Data Analysis:   Physical Exam  BP (!) 160/100 (BP  Location: Left Arm, Patient Position: Sitting)   Pulse (!) 108   Temp (!) 97.3 F (36.3 C) (Temporal)   Ht 5\' 9"  (1.753 m)   Wt 208 lb 9.6 oz (94.6 kg)   SpO2 93%   BMI 30.80 kg/m  Wt Readings from Last 10 Encounters:  02/25/23 208 lb 9.6 oz (94.6 kg)  02/17/23 205 lb 3.2 oz (93.1 kg)  12/03/22 207 lb 12.8 oz (94.3 kg)  11/26/22 209 lb (94.8 kg)  11/05/22 210 lb 3.2 oz (95.3 kg)  10/29/22 205 lb 3.2 oz (93.1 kg)  10/22/22 197 lb (89.4 kg)  10/08/22 199 lb 3.2 oz (90.4 kg)  10/07/22 197 lb 3.2 oz (89.4 kg)  09/23/22 188 lb 9.6 oz (85.5 kg)   Vital signs reviewed.  Nursing notes reviewed. Weight trend reviewed. Abnormalities and Problem-Specific physical exam findings:  truncal adiposity   General Appearance:  No acute distress appreciable.   Well-groomed, healthy-appearing male.  Well proportioned with no abnormal fat distribution.  Good muscle tone. Skin: Clear and well-hydrated. Pulmonary:  Normal work of breathing at rest, no respiratory distress apparent. SpO2: 93 %  Musculoskeletal: not evaluated toes amputated.  Neurological:  Awake, alert, oriented, and engaged.  No obvious focal neurological deficits or cognitive impairments.  Sensorium seems unclouded.   Speech is clear and coherent with logical content. Psychiatric:  Appropriate mood, pleasant and cooperative demeanor, thoughtful and engaged during the exam  Results Reviewed:      Office Visit on 02/25/2023  Component Date Value   Sodium 02/25/2023 137    Potassium 02/25/2023 4.2    Chloride 02/25/2023 100    CO2 02/25/2023 29    Glucose, Bld 02/25/2023 119 (H)    BUN 02/25/2023 16    Creatinine, Ser 02/25/2023 1.15    GFR 02/25/2023 67.40    Calcium 02/25/2023 8.9   Lab on 11/30/2022  Component Date Value   Free T4 11/30/2022 1.52    TSH 11/30/2022 0.22 (L)    Sodium 11/30/2022 139    Potassium 11/30/2022  3.9    Chloride 11/30/2022 100    CO2 11/30/2022 30    Glucose, Bld 11/30/2022 138 (H)    BUN 11/30/2022 12    Creatinine, Ser 11/30/2022 1.11    GFR 11/30/2022 70.44    Calcium 11/30/2022 9.1   Lab on 10/19/2022  Component Date Value   Glucose 10/19/2022 130 (H)    BUN 10/19/2022 10    Creatinine, Ser 10/19/2022 0.83    eGFR 10/19/2022 98    BUN/Creatinine Ratio 10/19/2022 12    Sodium 10/19/2022 140    Potassium 10/19/2022 3.9    Chloride 10/19/2022 100    CO2 10/19/2022 26    Calcium 10/19/2022 9.0    Total Protein 10/19/2022 6.6    Albumin 10/19/2022 4.3    Globulin, Total 10/19/2022 2.3    Albumin/Globulin Ratio 10/19/2022 1.9    Bilirubin Total 10/19/2022 0.5    Alkaline Phosphatase 10/19/2022 119    AST 10/19/2022 22    ALT 10/19/2022 32    TSH 10/19/2022 0.643    Free T4 10/19/2022 2.27 (H)    WBC 10/19/2022 9.4    RBC 10/19/2022 4.84    Hemoglobin 10/19/2022 14.7    Hematocrit 10/19/2022 44.7    MCV 10/19/2022 92    MCH 10/19/2022 30.4    MCHC 10/19/2022 32.9    RDW 10/19/2022 13.6    Platelets 10/19/2022 175   No results displayed because visit has over 200 results.  Admission on 09/11/2022, Discharged on 09/15/2022  Component Date Value   Sodium 09/11/2022 137    Potassium 09/11/2022 4.3    Chloride 09/11/2022 101    CO2 09/11/2022 28    Glucose, Bld 09/11/2022 99    BUN 09/11/2022 12    Creatinine, Ser 09/11/2022 0.95    Calcium 09/11/2022 8.8 (L)    GFR, Estimated 09/11/2022 >60    Anion gap 09/11/2022 8    WBC 09/11/2022 10.4    RBC 09/11/2022 4.77    Hemoglobin 09/11/2022 14.7    HCT 09/11/2022 44.7    MCV 09/11/2022 93.7    MCH 09/11/2022 30.8    MCHC 09/11/2022 32.9    RDW 09/11/2022 13.0    Platelets 09/11/2022 188    nRBC 09/11/2022 0.0    Troponin I (High Sensiti* 09/11/2022 5    Troponin I (High Sensiti* 09/11/2022 5    SARS Coronavirus 2 by RT* 09/11/2022 NEGATIVE    Influenza A by PCR 09/11/2022 NEGATIVE    Influenza B by PCR  09/11/2022 NEGATIVE    Resp Syncytial Virus by * 09/11/2022 NEGATIVE    B Natriuretic Peptide 09/11/2022 225.9 (H)    Weight 09/12/2022 3,088    Height 09/12/2022 69    BP 09/12/2022 109/95    S' Lateral 09/12/2022 3.58    WBC 09/12/2022 10.9 (H)    RBC 09/12/2022 4.65    Hemoglobin 09/12/2022 14.2    HCT 09/12/2022 43.7    MCV 09/12/2022 94.0    MCH 09/12/2022 30.5    MCHC 09/12/2022 32.5    RDW 09/12/2022 13.1    Platelets 09/12/2022 190    nRBC 09/12/2022 0.0    Sodium 09/12/2022 138    Potassium 09/12/2022 3.7    Chloride 09/12/2022 102    CO2 09/12/2022 26    Glucose, Bld 09/12/2022 108 (H)    BUN 09/12/2022 13    Creatinine, Ser 09/12/2022 0.95    Calcium 09/12/2022 8.5 (L)    GFR, Estimated 09/12/2022 >60    Anion gap 09/12/2022 10    Adenovirus 09/12/2022 NOT DETECTED    Coronavirus 229E 09/12/2022 NOT DETECTED    Coronavirus HKU1 09/12/2022 NOT DETECTED    Coronavirus NL63 09/12/2022 NOT DETECTED    Coronavirus OC43 09/12/2022 NOT DETECTED    Metapneumovirus 09/12/2022 NOT DETECTED    Rhinovirus / Enterovirus 09/12/2022 DETECTED (A)    Influenza A 09/12/2022 NOT DETECTED    Influenza B 09/12/2022 NOT DETECTED    Parainfluenza Virus 1 09/12/2022 NOT DETECTED    Parainfluenza Virus 2 09/12/2022 NOT DETECTED    Parainfluenza Virus 3 09/12/2022 NOT DETECTED    Parainfluenza Virus 4 09/12/2022 NOT DETECTED    Respiratory Syncytial Vi* 09/12/2022 NOT DETECTED    Bordetella pertussis 09/12/2022 NOT DETECTED    Bordetella Parapertussis 09/12/2022 NOT DETECTED    Chlamydophila pneumoniae 09/12/2022 NOT DETECTED    Mycoplasma pneumoniae 09/12/2022 NOT DETECTED    WBC 09/13/2022 12.4 (H)    RBC 09/13/2022 4.59    Hemoglobin 09/13/2022 14.1    HCT 09/13/2022 42.6    MCV 09/13/2022 92.8    MCH 09/13/2022 30.7    MCHC 09/13/2022 33.1    RDW 09/13/2022 13.0    Platelets 09/13/2022 210    nRBC 09/13/2022 0.0    Neutrophils Relative % 09/13/2022 60    Neutro Abs  09/13/2022 7.4    Lymphocytes Relative 09/13/2022 24    Lymphs Abs 09/13/2022 3.0    Monocytes Relative 09/13/2022 15  Monocytes Absolute 09/13/2022 1.8 (H)    Eosinophils Relative 09/13/2022 1    Eosinophils Absolute 09/13/2022 0.1    Basophils Relative 09/13/2022 0    Basophils Absolute 09/13/2022 0.0    Immature Granulocytes 09/13/2022 0    Abs Immature Granulocytes 09/13/2022 0.04    Sodium 09/13/2022 132 (L)    Potassium 09/13/2022 3.6    Chloride 09/13/2022 97 (L)    CO2 09/13/2022 25    Glucose, Bld 09/13/2022 118 (H)    BUN 09/13/2022 9    Creatinine, Ser 09/13/2022 0.90    Calcium 09/13/2022 8.5 (L)    Total Protein 09/13/2022 6.0 (L)    Albumin 09/13/2022 3.4 (L)    AST 09/13/2022 29    ALT 09/13/2022 35    Alkaline Phosphatase 09/13/2022 93    Total Bilirubin 09/13/2022 0.7    GFR, Estimated 09/13/2022 >60    Anion gap 09/13/2022 10    Magnesium 09/13/2022 1.7    Phosphorus 09/13/2022 4.0    Sodium 09/14/2022 131 (L)    Potassium 09/14/2022 4.3    Chloride 09/14/2022 96 (L)    CO2 09/14/2022 25    Glucose, Bld 09/14/2022 115 (H)    BUN 09/14/2022 6 (L)    Creatinine, Ser 09/14/2022 0.86    Calcium 09/14/2022 8.7 (L)    GFR, Estimated 09/14/2022 >60    Anion gap 09/14/2022 10    WBC 09/14/2022 15.8 (H)    RBC 09/14/2022 4.69    Hemoglobin 09/14/2022 14.6    HCT 09/14/2022 42.3    MCV 09/14/2022 90.2    MCH 09/14/2022 31.1    MCHC 09/14/2022 34.5    RDW 09/14/2022 12.7    Platelets 09/14/2022 201    nRBC 09/14/2022 0.0    Magnesium 09/14/2022 1.7    Phosphorus 09/14/2022 4.0    Prothrombin Time 09/14/2022 21.3 (H)    INR 09/14/2022 1.9 (H)    Sodium 09/15/2022 135    Potassium 09/15/2022 3.5    Chloride 09/15/2022 99    CO2 09/15/2022 25    Glucose, Bld 09/15/2022 96    BUN 09/15/2022 8    Creatinine, Ser 09/15/2022 1.11    Calcium 09/15/2022 8.7 (L)    GFR, Estimated 09/15/2022 >60    Anion gap 09/15/2022 11    WBC 09/15/2022 15.2 (H)    RBC  09/15/2022 4.53    Hemoglobin 09/15/2022 13.8    HCT 09/15/2022 41.9    MCV 09/15/2022 92.5    MCH 09/15/2022 30.5    MCHC 09/15/2022 32.9    RDW 09/15/2022 12.8    Platelets 09/15/2022 208    nRBC 09/15/2022 0.0    Magnesium 09/15/2022 1.7    Phosphorus 09/15/2022 3.8   Lab on 08/31/2022  Component Date Value   Sodium 08/31/2022 138    Potassium 08/31/2022 4.2    Chloride 08/31/2022 100    CO2 08/31/2022 29    Glucose, Bld 08/31/2022 110 (H)    BUN 08/31/2022 13    Creatinine, Ser 08/31/2022 0.95    GFR 08/31/2022 85.06    Calcium 08/31/2022 9.6    Free T4 08/31/2022 1.39    TSH 08/31/2022 0.26 (L)   Lab on 07/06/2022  Component Date Value   Sodium 07/06/2022 135    Potassium 07/06/2022 4.8    Chloride 07/06/2022 100    CO2 07/06/2022 29    Glucose, Bld 07/06/2022 112 (H)    BUN 07/06/2022 11    Creatinine, Ser 07/06/2022 0.78  GFR 07/06/2022 94.87    Calcium 07/06/2022 9.3    Free T4 07/06/2022 0.93    TSH 07/06/2022 8.41 (H)   Admission on 05/25/2022, Discharged on 05/25/2022  Component Date Value   WBC 05/25/2022 14.6 (H)    RBC 05/25/2022 4.79    Hemoglobin 05/25/2022 14.9    HCT 05/25/2022 44.0    MCV 05/25/2022 91.9    MCH 05/25/2022 31.1    MCHC 05/25/2022 33.9    RDW 05/25/2022 13.8    Platelets 05/25/2022 322    nRBC 05/25/2022 0.0    Neutrophils Relative % 05/25/2022 76    Neutro Abs 05/25/2022 11.1 (H)    Lymphocytes Relative 05/25/2022 15    Lymphs Abs 05/25/2022 2.2    Monocytes Relative 05/25/2022 8    Monocytes Absolute 05/25/2022 1.2 (H)    Eosinophils Relative 05/25/2022 0    Eosinophils Absolute 05/25/2022 0.0    Basophils Relative 05/25/2022 0    Basophils Absolute 05/25/2022 0.0    Immature Granulocytes 05/25/2022 1    Abs Immature Granulocytes 05/25/2022 0.08 (H)    Sodium 05/25/2022 134 (L)    Potassium 05/25/2022 4.3    Chloride 05/25/2022 99    CO2 05/25/2022 27    Glucose, Bld 05/25/2022 109 (H)    BUN 05/25/2022 10     Creatinine, Ser 05/25/2022 0.69    Calcium 05/25/2022 9.1    Total Protein 05/25/2022 6.7    Albumin 05/25/2022 4.0    AST 05/25/2022 12 (L)    ALT 05/25/2022 12    Alkaline Phosphatase 05/25/2022 69    Total Bilirubin 05/25/2022 0.8    GFR, Estimated 05/25/2022 >60    Anion gap 05/25/2022 8    Troponin I (High Sensiti* 05/25/2022 2    Troponin I (High Sensiti* 05/25/2022 3   Admission on 05/12/2022, Discharged on 05/12/2022  Component Date Value   WBC 05/12/2022 9.9    RBC 05/12/2022 4.09 (L)    Hemoglobin 05/12/2022 12.7 (L)    HCT 05/12/2022 37.7 (L)    MCV 05/12/2022 92.2    MCH 05/12/2022 31.1    MCHC 05/12/2022 33.7    RDW 05/12/2022 13.2    Platelets 05/12/2022 196    nRBC 05/12/2022 0.0    Neutrophils Relative % 05/12/2022 79    Neutro Abs 05/12/2022 7.9 (H)    Lymphocytes Relative 05/12/2022 10    Lymphs Abs 05/12/2022 1.0    Monocytes Relative 05/12/2022 9    Monocytes Absolute 05/12/2022 0.9    Eosinophils Relative 05/12/2022 1    Eosinophils Absolute 05/12/2022 0.1    Basophils Relative 05/12/2022 0    Basophils Absolute 05/12/2022 0.0    Immature Granulocytes 05/12/2022 1    Abs Immature Granulocytes 05/12/2022 0.06    Sodium 05/12/2022 132 (L)    Potassium 05/12/2022 3.9    Chloride 05/12/2022 101    CO2 05/12/2022 24    Glucose, Bld 05/12/2022 102 (H)    BUN 05/12/2022 10    Creatinine, Ser 05/12/2022 0.78    Calcium 05/12/2022 8.4 (L)    GFR, Estimated 05/12/2022 >60    Anion gap 05/12/2022 7   Admission on 05/09/2022, Discharged on 05/10/2022  Component Date Value   Sodium 05/09/2022 131 (L)    Potassium 05/09/2022 3.9    Chloride 05/09/2022 101    CO2 05/09/2022 22    Glucose, Bld 05/09/2022 119 (H)    BUN 05/09/2022 32 (H)    Creatinine, Ser 05/09/2022 2.68 (H)    Calcium  05/09/2022 8.4 (L)    GFR, Estimated 05/09/2022 26 (L)    Anion gap 05/09/2022 8    WBC 05/09/2022 15.7 (H)    RBC 05/09/2022 3.97 (L)    Hemoglobin 05/09/2022 12.5 (L)     HCT 05/09/2022 36.3 (L)    MCV 05/09/2022 91.4    MCH 05/09/2022 31.5    MCHC 05/09/2022 34.4    RDW 05/09/2022 13.4    Platelets 05/09/2022 230    nRBC 05/09/2022 0.0    Color, Urine 05/09/2022 YELLOW    APPearance 05/09/2022 HAZY (A)    Specific Gravity, Urine 05/09/2022 1.016    pH 05/09/2022 5.0    Glucose, UA 05/09/2022 NEGATIVE    Hgb urine dipstick 05/09/2022 NEGATIVE    Bilirubin Urine 05/09/2022 NEGATIVE    Ketones, ur 05/09/2022 5 (A)    Protein, ur 05/09/2022 NEGATIVE    Nitrite 05/09/2022 NEGATIVE    Leukocytes,Ua 05/09/2022 NEGATIVE    Troponin I (High Sensiti* 05/09/2022 4    TSH 05/09/2022 0.338 (L)    Free T4 05/09/2022 1.56 (H)    HIV Screen 4th Generatio* 05/09/2022 Non Reactive    WBC 05/09/2022 13.4 (H)    RBC 05/09/2022 4.12 (L)    Hemoglobin 05/09/2022 12.8 (L)    HCT 05/09/2022 37.9 (L)    MCV 05/09/2022 92.0    MCH 05/09/2022 31.1    MCHC 05/09/2022 33.8    RDW 05/09/2022 13.4    Platelets 05/09/2022 221    nRBC 05/09/2022 0.0    Creatinine, Ser 05/09/2022 1.77 (H)    GFR, Estimated 05/09/2022 43 (L)    Vitamin B-12 05/09/2022 241    Folate 05/09/2022 9.2    Iron 05/09/2022 28 (L)    TIBC 05/09/2022 290    Saturation Ratios 05/09/2022 10 (L)    UIBC 05/09/2022 262    Ferritin 05/09/2022 114    Retic Ct Pct 05/09/2022 1.4    RBC. 05/09/2022 4.00 (L)    Retic Count, Absolute 05/09/2022 54.4    Immature Retic Fract 05/09/2022 2.8    Magnesium 05/10/2022 1.8    Sodium 05/10/2022 132 (L)    Potassium 05/10/2022 4.3    Chloride 05/10/2022 106    CO2 05/10/2022 21 (L)    Glucose, Bld 05/10/2022 113 (H)    BUN 05/10/2022 18    Creatinine, Ser 05/10/2022 0.79    Calcium 05/10/2022 8.2 (L)    GFR, Estimated 05/10/2022 >60    Anion gap 05/10/2022 5    WBC 05/10/2022 11.6 (H)    RBC 05/10/2022 3.90 (L)    Hemoglobin 05/10/2022 12.1 (L)    HCT 05/10/2022 35.8 (L)    MCV 05/10/2022 91.8    MCH 05/10/2022 31.0    MCHC 05/10/2022 33.8    RDW  05/10/2022 13.7    Platelets 05/10/2022 222    nRBC 05/10/2022 0.0   There may be more visits with results that are not included.   No image results found.   No results found.     This encounter employed real-time, collaborative documentation. The patient actively reviewed and updated their medical record on a shared screen, ensuring transparency and facilitating joint problem-solving for the problem list, overview, and plan. This approach promotes accurate, informed care. The treatment plan was discussed and reviewed in detail, including medication safety, potential side effects, and all patient questions. We confirmed understanding and comfort with the plan. Follow-up instructions were established, including contacting the office for any concerns, returning if symptoms worsen, persist, or  new symptoms develop, and precautions for potential emergency department visits. ----------------------------------------------------- Lula Olszewski, MD  02/26/2023 8:36 PM  [facility]:  [phone]

## 2023-02-25 NOTE — Assessment & Plan Note (Addendum)
Referring care to endocrine since Dr. Lucianne Muss retiring, but he asks me to hold off until he can get more info from Dr. Lucianne Muss  Hypothyroidism: His hypothyroidism, likely secondary to prior Amiodarone treatment for AFib, is currently managed with Synthroid. We will refer him to Dr. Sula Rumple, Endocrinologist, for further management.

## 2023-02-25 NOTE — Assessment & Plan Note (Addendum)
Poorly controlled despite 3 medication(s) - reclassified as resistant and planned workup accordingly:  Renal function tests (serum creatinine, BUN, and eGFR) to assess kidney function. Aldosterone-renin ratio to evaluate for primary aldosteronism. Urinary metanephrines and catecholamines to exclude pheochromocytoma. add spironolactone and demadex to balance diuresis., though he is euvolemic, allergic to hydrochloroTHIAZIDE Consider nitrates.  Medication Refill: He requests a refill of Metoprolol. We will refill Metoprolol 50mg  BID, providing a 90-day supply with three refills. Hypertension: He exhibits resistant hypertension with blood pressure readings consistently in the 160s despite current treatment with Clonidine, Losartan, and Metoprolol, and reports no side effects from these medications. He also denies significant salt or alcohol intake. We will add Spironolactone and Torsemide to his regimen, order a 24-hour urine collection to assess for pheochromocytoma, consider a sleep study for obstructive sleep apnea, and recheck his blood pressure in 2 weeks.  Reviewed available data from patient and  BP Readings from Last 3 Encounters:  02/25/23 (!) 160/100  02/17/23 (!) 186/107  12/03/22 (!) 144/100   My individualized, goal average blood pressure for this patient, after considering the evidence for and against aggressive blood pressure goals as well as their past medical history and preferences, is 140/90 In my medical opinion, this problem is stable, poorly controlled  Increased medication(s) prescribed after collaborative review. Following informed consent, we adjusted the medication regimen as per documented orders to now be:  Current hypertension medications:       Sig   cloNIDine (CATAPRES - DOSED IN MG/24 HR) 0.1 mg/24hr patch (Taking) Place 1 patch (0.1 mg total) onto the skin once a week.   losartan (COZAAR) 100 MG tablet (Taking) Take 1 tablet (100 mg total) by mouth daily.    metoprolol tartrate (LOPRESSOR) 50 MG tablet Take 1 tablet (50 mg total) by mouth 2 (two) times daily.     We explained the expected benefits and potential side effects of the new medications and encouraged the patient to report any concerns. Information for patient review: Please limit and avoid: salt, alcohol, NSAIDS, excess body weight, smoking, stress, sedentary lifestyles The risks of poor control over time are FUTURE stroke and heart attacks, but if you have a blood pressure over 180 and any red flag symptoms(headache/shortness of breath/confusion/chest discomfort) you should go to the ER.  You are encouraged to do home blood pressure monitoring, at least as many times per week as blood pressure medications you are on.  For example, bring a diary with 3 home blood pressure readings per week to each visit if you are on 3 blood pressure medications.   See AFTER VISIT SUMMARY for addition educational information provided Please let me know in advance when you need medication(s) refills, consistently taking your medication is very important!

## 2023-02-26 ENCOUNTER — Encounter: Payer: Self-pay | Admitting: Internal Medicine

## 2023-02-26 DIAGNOSIS — Z0000 Encounter for general adult medical examination without abnormal findings: Secondary | ICD-10-CM | POA: Insufficient documentation

## 2023-02-26 LAB — BASIC METABOLIC PANEL
BUN: 16 mg/dL (ref 6–23)
CO2: 29 mEq/L (ref 19–32)
Calcium: 8.9 mg/dL (ref 8.4–10.5)
Chloride: 100 mEq/L (ref 96–112)
Creatinine, Ser: 1.15 mg/dL (ref 0.40–1.50)
GFR: 67.4 mL/min (ref >60.00)
Glucose, Bld: 119 mg/dL — ABNORMAL HIGH (ref 70–99)
Potassium: 4.2 mEq/L (ref 3.5–5.1)
Sodium: 137 mEq/L (ref 135–145)

## 2023-02-26 NOTE — Assessment & Plan Note (Addendum)
Foot Pain: Improvement is noted with Prednisone treatment. We will continue the current management plan. No results found for: "LABURIC"  but no need for labwork today- will check with next routine blood draw Has celecoxib for pain. Put problem and diet information in AVS

## 2023-02-26 NOTE — Assessment & Plan Note (Addendum)
February 26, 2023 needs we didn't get to discuss: Eligible for Lung Cancer Screening Tobacco counseling needed Depression Monitoring Refresh HCC Diagnosis  General Health Maintenance: He should continue his current medications, including Flomax and Xarelto, and keep monitoring his blood pressure at home. He will return for a follow-up in 2 weeks to reassess blood pressure control.

## 2023-02-26 NOTE — Patient Instructions (Addendum)
It was a pleasure seeing you today! Your health and satisfaction are our top priorities.   Jesse Hew, MD  VISIT SUMMARY:  During your visit, we discussed your ongoing issues with high blood pressure, thyroid disease, and foot pain. We also talked about your medications and lifestyle habits. We made some changes to your treatment plan to better manage your conditions.  YOUR PLAN:  -HIGH BLOOD PRESSURE: Your blood pressure has been consistently high despite your current medications. We're adding two new medications, Spironolactone and Torsemide, to help lower your blood pressure. We'll also check for a condition called pheochromocytoma, which can cause high blood pressure, and consider a sleep study to see if sleep apnea is contributing to your high blood pressure. We'll recheck your blood pressure in 2 weeks.  -THYROID DISEASE: Your thyroid disease is currently managed with Synthroid. We're referring you to an endocrinologist, Dr. Sula Rumple, for further management.  -MEDICATION REFILL: You requested a refill of Metoprolol. We've refilled this medication for you, providing a 90-day supply with three refills.  -FOOT PAIN: Your foot pain has improved with Prednisone treatment, will avoid it now and shift to preventing gout recurrence withdiet.  -GENERAL HEALTH MAINTENANCE: Continue taking your current medications, including Flomax and Xarelto, and keep monitoring your blood pressure at home. We'll reassess your blood pressure control in 2 weeks.  INSTRUCTIONS:  Please start taking the new medications, Spironolactone and Torsemide, as directed. Collect your urine for 24 hours for the pheochromocytoma test. Continue taking your current medications and monitor your blood pressure at home. Make sure to return for a follow-up in 2 weeks to reassess your blood pressure control. Also, please schedule an appointment with Dr. Sula Rumple, the endocrinologist, for further management of your  thyroid disease.   Next Steps:  [x]  Early Intervention: Schedule sooner appointment, call our on-call services, or go to emergency room if there is Increase in pain or discomfort New or worsening symptoms Sudden or severe changes in your health [x]  Flexible Follow-Up: We recommend a Return in about 2 weeks (around 03/11/2023) for close follow up acute illness, chronic disease monitoring and management. for optimal routine care. This allows for progress monitoring and treatment adjustments. [x]  Preventive Care: Schedule your annual preventive care visit! It's typically covered by insurance and helps identify potential health issues early. [x]  Lab & X-ray Appointments: Incomplete tests scheduled today, or call to schedule. X-rays: [facility] (M-F, 8:30am-noon or 1pm-5pm). [x]  Medical Information Release: Sign a release form at front desk to obtain relevant medical information we don't have.  Making the Most of Our Focused (20 minute) Appointments:  [x]   Clearly state your top concerns at the beginning of the visit to focus our discussion [x]   If you anticipate you will need more time, please inform the front desk during scheduling - we can book multiple appointments in the same week. [x]   If you have transportation problems- use our convenient video appointments or ask about transportation support. [x]   We can get down to business faster if you use MyChart to update information before the visit and submit non-urgent questions before your visit. Thank you for taking the time to provide details through MyChart.  Let our nurse know and she can import this information into your encounter documents.  Arrival and Wait Times: [x]   Arriving on time ensures that everyone receives prompt attention. [x]   Early morning (8a) and afternoon (1p) appointments tend to have shortest wait times. [x]   Unfortunately, we cannot  delay appointments for late arrivals or hold slots during phone  calls.  Getting Answers and Following Up  [x]   Simple Questions & Concerns: For quick questions or basic follow-up after your visit, reach [place] at [phone] or MyChart messaging. [x]   Complex Concerns: If your concern is more complex, scheduling an appointment might be best. Discuss this with the staff to find the most suitable option. [x]   Lab & Imaging Results: We'll contact you directly if results are abnormal or you don't use MyChart. Most normal results will be on MyChart within 2-3 business days, with a review message from Dr. Jon Billings. Haven't heard back in 2 weeks? Need results sooner? Contact us at [phone]. [x]   Referrals: Our referral coordinator will manage specialist referrals. The specialist's office should contact you within 2 weeks to schedule an appointment. Call us if you haven't heard from them after 2 weeks.  Staying Connected  [x]   MyChart: Activate your MyChart for the fastest way to access results and message [place]. See the last page of this paperwork for instructions on how to activate.  Bring to Your Next Appointment  [x]   Medications: Please bring all your medication bottles to your next appointment to ensure we have an accurate record of your prescriptions. [x]   Health Diaries: If you're monitoring any health conditions at home, keeping a diary of your readings can be very helpful for discussions at your next appointment.  Billing  [x]   X-ray & Lab Orders: These are billed by separate companies. Contact the invoicing company directly for questions or concerns. [x]   Visit Charges: Discuss any billing inquiries with our administrative services team.  Your Satisfaction Matters  [x]   Share Your Experience: We strive for your satisfaction! If you have any complaints, or preferably compliments, please let Dr. Jon Billings know directly or contact our Practice Administrators, Edwena Felty or [employer], by asking at the front desk.   Reviewing Your Records  [x]    Review this early draft of your clinical encounter notes below and the final encounter summary tomorrow on MyChart after its been completed.   Postablative hypothyroidism Assessment & Plan: Referring care to endocrine since Dr. Lucianne Muss retiring, but he asks me to hold off until he can get more info from Dr. Lucianne Muss  Hypothyroidism: His hypothyroidism, likely secondary to prior Amiodarone treatment for AFib, is currently managed with Synthroid. We will refer him to Dr. Sula Rumple, Endocrinologist, for further management.   Resistant hypertension Assessment & Plan: Poorly controlled despite 3 medication(s) - reclassified as resistant and planned workup accordingly:  Renal function tests (serum creatinine, BUN, and eGFR) to assess kidney function. Aldosterone-renin ratio to evaluate for primary aldosteronism. Urinary metanephrines and catecholamines to exclude pheochromocytoma. add spironolactone and demadex to balance diuresis., though he is euvolemic, allergic to hydrochloroTHIAZIDE Consider nitrates.  Medication Refill: He requests a refill of Metoprolol. We will refill Metoprolol 50mg  BID, providing a 90-day supply with three refills. Hypertension: He exhibits resistant hypertension with blood pressure readings consistently in the 160s despite current treatment with Clonidine, Losartan, and Metoprolol, and reports no side effects from these medications. He also denies significant salt or alcohol intake. We will add Spironolactone and Torsemide to his regimen, order a 24-hour urine collection to assess for pheochromocytoma, consider a sleep study for obstructive sleep apnea, and recheck his blood pressure in 2 weeks.  Reviewed available data from patient and  BP Readings from Last 3 Encounters:  02/25/23 (!) 160/100  02/17/23 (!) 186/107  12/03/22 (!) 144/100  My individualized, goal average blood pressure for this patient, after considering the evidence for and against aggressive  blood pressure goals as well as their past medical history and preferences, is 140/90 In my medical opinion, this problem is stable, poorly controlled  Increased medication(s) prescribed after collaborative review. Following informed consent, we adjusted the medication regimen as per documented orders to now be:  Current hypertension medications:       Sig   cloNIDine (CATAPRES - DOSED IN MG/24 HR) 0.1 mg/24hr patch (Taking) Place 1 patch (0.1 mg total) onto the skin once a week.   losartan (COZAAR) 100 MG tablet (Taking) Take 1 tablet (100 mg total) by mouth daily.   metoprolol tartrate (LOPRESSOR) 50 MG tablet Take 1 tablet (50 mg total) by mouth 2 (two) times daily.     We explained the expected benefits and potential side effects of the new medications and encouraged the patient to report any concerns. Information for patient review: Please limit and avoid: salt, alcohol, NSAIDS, excess body weight, smoking, stress, sedentary lifestyles The risks of poor control over time are FUTURE stroke and heart attacks, but if you have a blood pressure over 180 and any red flag symptoms(headache/shortness of breath/confusion/chest discomfort) you should go to the ER.  You are encouraged to do home blood pressure monitoring, at least as many times per week as blood pressure medications you are on.  For example, bring a diary with 3 home blood pressure readings per week to each visit if you are on 3 blood pressure medications.   See AFTER VISIT SUMMARY for addition educational information provided Please let me know in advance when you need medication(s) refills, consistently taking your medication is very important!   Orders: -     Metoprolol Tartrate; Take 1 tablet (50 mg total) by mouth 2 (two) times daily.  Dispense: 180 tablet; Refill: 3 -     Torsemide; Take 1 tablet (10 mg total) by mouth daily.  Dispense: 90 tablet; Refill: 3 -     Spironolactone; Take 1 tablet (25 mg total) by mouth daily.   Dispense: 90 tablet; Refill: 3 -     Basic metabolic panel -     Aldosterone + renin activity w/ ratio -     Metanephrines, urine, 24 hour  Acute idiopathic gout of multiple sites Assessment & Plan: Foot Pain: Improvement is noted with Prednisone treatment. We will continue the current management plan. No results found for: "LABURIC"  but no need for labwork today- will check with next routine blood draw Has celecoxib for pain. Put problem and diet information in AVS     Healthcare maintenance Assessment & Plan: February 26, 2023 needs we didn't get to discuss: Eligible for Lung Cancer Screening Tobacco counseling needed Depression Monitoring Refresh HCC Diagnosis  General Health Maintenance: He should continue his current medications, including Flomax and Xarelto, and keep monitoring his blood pressure at home. He will return for a follow-up in 2 weeks to reassess blood pressure control.

## 2023-02-27 ENCOUNTER — Other Ambulatory Visit: Payer: Self-pay | Admitting: Endocrinology

## 2023-03-01 ENCOUNTER — Other Ambulatory Visit (INDEPENDENT_AMBULATORY_CARE_PROVIDER_SITE_OTHER): Payer: 59

## 2023-03-01 DIAGNOSIS — E890 Postprocedural hypothyroidism: Secondary | ICD-10-CM

## 2023-03-01 DIAGNOSIS — I10 Essential (primary) hypertension: Secondary | ICD-10-CM

## 2023-03-01 LAB — BASIC METABOLIC PANEL
BUN: 14 mg/dL (ref 6–23)
CO2: 31 mEq/L (ref 19–32)
Calcium: 9 mg/dL (ref 8.4–10.5)
Chloride: 94 mEq/L — ABNORMAL LOW (ref 96–112)
Creatinine, Ser: 1.3 mg/dL (ref 0.40–1.50)
GFR: 58.18 mL/min — ABNORMAL LOW (ref >60.00)
Glucose, Bld: 182 mg/dL — ABNORMAL HIGH (ref 70–99)
Potassium: 3.2 mEq/L — ABNORMAL LOW (ref 3.5–5.1)
Sodium: 136 mEq/L (ref 135–145)

## 2023-03-01 LAB — T4, FREE: Free T4: 1.12 ng/dL (ref 0.60–1.60)

## 2023-03-01 LAB — TSH: TSH: 8.05 u[IU]/mL — ABNORMAL HIGH (ref 0.35–5.50)

## 2023-03-04 ENCOUNTER — Ambulatory Visit: Payer: 59 | Admitting: Endocrinology

## 2023-03-04 ENCOUNTER — Encounter: Payer: Self-pay | Admitting: Endocrinology

## 2023-03-04 VITALS — BP 144/76 | HR 92 | Ht 69.0 in | Wt 205.0 lb

## 2023-03-04 DIAGNOSIS — E890 Postprocedural hypothyroidism: Secondary | ICD-10-CM | POA: Diagnosis not present

## 2023-03-04 DIAGNOSIS — R739 Hyperglycemia, unspecified: Secondary | ICD-10-CM | POA: Diagnosis not present

## 2023-03-04 DIAGNOSIS — I10 Essential (primary) hypertension: Secondary | ICD-10-CM

## 2023-03-04 LAB — POCT GLUCOSE (DEVICE FOR HOME USE): POC Glucose: 134 mg/dl — AB (ref 70–99)

## 2023-03-04 LAB — POCT GLYCOSYLATED HEMOGLOBIN (HGB A1C): Hemoglobin A1C: 6.1 % — AB (ref 4.0–5.6)

## 2023-03-04 NOTE — Progress Notes (Signed)
Patient ID: Jesse Simmer [name], male   DOB: 07/13/1961, 62 y.o.   MRN: [id number]   Reason for Appointment: Follow-up    History of Present Illness:   The hypothyroidism was first diagnosed in 11/13. Previously had I-131 treatment for Graves' disease He was initially started with 88 mcg but his dose needed to be increased progressively  He had been treated with brand name SYNTHROID, the dose has been adjusted previously based on his labs  He is currently taking 200 mcg generic Synthroid prescription, 6 days a week However his dose has been significantly variable between 125 and 200 mcg  He takes this before breakfast consistently every day as directed Has not missed any doses lately  TSH has again fluctuated without any change in his compliance  His dosage was reduced to only by half tablet weekly in March but his TSH is now increased significantly to 8 Difficult to assess his symptoms as he tends to have fatigue chronically    Wt Readings from Last 3 Encounters:  03/04/23 205 lb (93 kg)  02/25/23 208 lb 9.6 oz (94.6 kg)  02/17/23 205 lb 3.2 oz (93.1 kg)    LABS:   Lab Results  Component Value Date   TSH 8.05 (H) 03/01/2023   TSH 0.22 (L) 11/30/2022   TSH 0.643 10/19/2022   FREET4 1.12 03/01/2023   FREET4 1.52 11/30/2022   FREET4 2.27 (H) 10/19/2022      HYPERTENSION:   He has had long-standing hypertension which is  managed with losartan 50, Catapres 0.1 mg patch and Coreg 1-1/2 tablets twice a day Now taking spironolactone 25 mg daily from his PCP for higher blood pressure readings  He was evaluated for secondary hypertension and reports of his aldosterone/renin indicate normal levels Also was supposed to get the renal artery ultrasound but this was not done  He has checked his blood pressure regularly at home with the [employer] monitor on his arm  Home his blood pressure is now around 130-140/80 but consistently higher when checked in the office;  before starting spironolactone he thinks blood pressure was 180 systolic  Blood pressure readings recently:  BP Readings from Last 3 Encounters:  03/04/23 (!) 144/76  02/25/23 (!) 160/100  02/17/23 (!) 186/107     Allergies as of 03/04/2023       Reactions   Sulfa Drugs Cross Reactors Hives   Codeine Other (See Comments)   Made him very jittery    Hydrochlorothiazide Other (See Comments)   Hyponatremia        Medication List        Accurate as of March 04, 2023  9:52 AM. If you have any questions, ask your nurse or doctor.          amiodarone 200 MG tablet Commonly known as: PACERONE Take 1 tablet (200 mg total) by mouth daily. What changed:  how much to take when to take this additional instructions   amoxicillin-clavulanate 875-125 MG tablet Commonly known as: AUGMENTIN Take 1 tablet by mouth 2 (two) times daily.   celecoxib 100 MG capsule Commonly known as: CeleBREX Take 1 capsule (100 mg total) by mouth 2 (two) times daily.   cloNIDine 0.1 mg/24hr patch Commonly known as: CATAPRES - Dosed in mg/24 hr Place 1 patch (0.1 mg total) onto the skin once a week.   cyclobenzaprine 10 MG tablet Commonly known as: FLEXERIL Take 1 tablet (10 mg total) by mouth 3 (three) times daily as needed for  muscle spasms.   FLUoxetine 20 MG tablet Commonly known as: PROZAC TAKE 1 TABLET BY MOUTH EVERY DAY   fluticasone 50 MCG/ACT nasal spray Commonly known as: FLONASE Place 2 sprays into both nostrils daily as needed for allergies.   fluticasone 50 MCG/ACT nasal spray Commonly known as: FLONASE Place 2 sprays into both nostrils daily.   isosorbide mononitrate 30 MG 24 hr tablet Commonly known as: IMDUR Take 1 tablet (30 mg total) by mouth daily. Cannot take levitra with.   levothyroxine 200 MCG tablet Commonly known as: SYNTHROID Take 1 tablet (200 mcg total) by mouth daily. Take as recommended by endocrine.  200 mg daily except for Sunday, take 100 mg. What  changed:  how much to take when to take this   loratadine 10 MG tablet Commonly known as: CLARITIN Take 1 tablet (10 mg total) by mouth daily.   losartan 100 MG tablet Commonly known as: COZAAR TAKE 1 TABLET BY MOUTH EVERY DAY   metoprolol tartrate 50 MG tablet Commonly known as: LOPRESSOR Take 1 tablet (50 mg total) by mouth 2 (two) times daily.   montelukast 10 MG tablet Commonly known as: SINGULAIR Take 1 tablet (10 mg total) by mouth at bedtime. TAKE 1 TABLET BY MOUTH EVERYDAY AT BEDTIME Strength: 10 mg   multivitamin with minerals tablet Take 1 tablet by mouth daily.   nicotine 21 mg/24hr patch Commonly known as: NICODERM CQ - dosed in mg/24 hours Place 1 patch (21 mg total) onto the skin daily.   rivaroxaban 20 MG Tabs tablet Commonly known as: XARELTO Take 1 tablet (20 mg total) by mouth daily with supper.   rosuvastatin 40 MG tablet Commonly known as: CRESTOR Take 1 tablet (40 mg total) by mouth daily. Replaces 20 mg dose   Simply Saline 0.9 % Aers Generic drug: Saline Place 2 each into the nose as directed. Use nightly for sinus hygiene long-term.  Can also be used as many times daily as desired to assist with clearing congested sinuses.   spironolactone 25 MG tablet Commonly known as: ALDACTONE Take 1 tablet (25 mg total) by mouth daily.   tamsulosin 0.4 MG Caps capsule Commonly known as: FLOMAX Take 1 capsule (0.4 mg total) by mouth daily.   torsemide 10 MG tablet Commonly known as: DEMADEX Take 1 tablet (10 mg total) by mouth daily.   Vitamin D-3 125 MCG (5000 UT) Tabs Take 5,000 Units by mouth daily.        Past Medical History:  Diagnosis Date   Acute hypoxemic respiratory failure (HCC) 09/18/2022   Allergy    Anxiety    Atrial fibrillation (HCC)    Atrial fibrillation with RVR (HCC) 09/12/2022   Bell palsy    Depression    Diastolic heart failure    HAP (hospital-acquired pneumonia) 09/17/2022   Hypercholesterolemia    Hypertension     Pneumonia    Thyroid disease     Past Surgical History:  Procedure Laterality Date   CARDIOVERSION N/A 09/15/2022   Procedure: CARDIOVERSION;  Surgeon: Christell Constant, MD;  Location: [facility] ENDOSCOPY;  Service: Cardiovascular;  Laterality: N/A;   CARDIOVERSION N/A 10/22/2022   Procedure: CARDIOVERSION;  Surgeon: Chrystie Nose, MD;  Location: [facility] ENDOSCOPY;  Service: Cardiovascular;  Laterality: N/A;   CHOLECYSTECTOMY     HERNIA REPAIR     KNEE SURGERY     VASECTOMY      Family History  Problem Relation Age of Onset   Heart disease Mother  Heart attack Mother    Other Mother        thyroid problems   Kidney cancer Mother    Cirrhosis Father    Other Father        thyroid problems   Alcohol abuse Father    Hypertension Father    Mental illness Brother    Parkinsonism Brother     Social History:  reports that he has been smoking cigarettes. He started smoking about 44 years ago. He has a 20.00 pack-year smoking history. He has never used smokeless tobacco. He reports current alcohol use. He reports that he does not use drugs.  Allergies:  Allergies  Allergen Reactions   Sulfa Drugs Cross Reactors Hives   Codeine Other (See Comments)    Made him very jittery    Hydrochlorothiazide Other (See Comments)    Hyponatremia    REVIEW of systems:   HYPONATREMIA: His sodium improved with stopping HCTZ  He was told to cut back on drinks like [facility]  He has likely reduced his overall fluid intake as sodium is now consistently normal Probably has had mild SIADH, still taking 20 mg Prozac  CT scan of his chest did not show any tumor  Sodium normal   Lab Results  Component Value Date   NA 136 03/01/2023   K 3.2 (L) 03/01/2023   CL 94 (L) 03/01/2023   CO2 31 03/01/2023   HYPERGLYCEMIA: His blood sugar was 182 in the lab but not as high today He drinks a lot of regular soft drinks Has family history of diabetes also  Lab Results  Component Value Date    HGBA1C 6.1 (A) 03/04/2023   HGBA1C 5.9 01/06/2022   HGBA1C 5.6 06/26/2021   Lab Results  Component Value Date   LDLCALC 84 03/31/2022   CREATININE 1.30 03/01/2023       Examination:   BP (!) 144/76 (BP Location: Left Arm, Patient Position: Sitting, Cuff Size: Small)   Pulse 92   Ht 5\' 9"  (1.753 m)   Wt 205 lb (93 kg)   SpO2 99%   BMI 30.27 kg/m      Assessments    HYPERTENSION:   His blood pressure today is improved with adding spironolactone However not clear why his potassium is low compared to his labs for 5 days ago  He will continue the same regimen and follow-up with PCP Since his low potassium may be related to unbalanced meals will have him get this rechecked with his PCP in 2 weeks without supplementation Likely needs 50 mg of spironolactone for consistent blood pressure control  HYPOTHYROIDISM:  secondary to I-131 treatment  His TSH is slightly low with 200 mcg Synthroid 6 days a week He will go back to taking 1 tablet every day now  Prediabetes: Discussed that his blood sugars are gradually increasing and he needs to significantly change his diet eliminating regular soft drinks and cutting back on carbohydrates and fast food  There are no Patient Instructions on file for this visit.   Reather Littler 03/04/2023, 9:52 AM

## 2023-03-04 NOTE — Patient Instructions (Addendum)
Synthroid 1 daily  Stop reg soft drinks

## 2023-03-05 LAB — METANEPHRINES, URINE, 24 HOUR
METANEPHRINE: 59 mcg/24 h — ABNORMAL LOW (ref 90–315)
METANEPHRINES, TOTAL: 308 mcg/24 h (ref 224–832)
NORMETANEPHRINE: 249 mcg/24 h (ref 122–676)
Total Volume: 1500 mL

## 2023-03-07 ENCOUNTER — Encounter: Payer: Self-pay | Admitting: Internal Medicine

## 2023-03-07 DIAGNOSIS — E876 Hypokalemia: Secondary | ICD-10-CM

## 2023-03-07 HISTORY — DX: Hypokalemia: E87.6

## 2023-03-08 ENCOUNTER — Ambulatory Visit: Payer: 59 | Attending: Cardiovascular Disease

## 2023-03-08 DIAGNOSIS — I480 Paroxysmal atrial fibrillation: Secondary | ICD-10-CM

## 2023-03-09 LAB — CBC
Hematocrit: 53.8 % — ABNORMAL HIGH (ref 37.5–51.0)
Hemoglobin: 16.9 g/dL (ref 13.0–17.7)
MCH: 29 pg (ref 26.6–33.0)
MCHC: 31.4 g/dL — ABNORMAL LOW (ref 31.5–35.7)
MCV: 92 fL (ref 79–97)
Platelets: 209 10*3/uL (ref 150–450)
RBC: 5.82 x10E6/uL — ABNORMAL HIGH (ref 4.14–5.80)
RDW: 14.4 % (ref 11.6–15.4)
WBC: 10 10*3/uL (ref 3.4–10.8)

## 2023-03-09 LAB — BASIC METABOLIC PANEL
BUN/Creatinine Ratio: 10 (ref 10–24)
BUN: 12 mg/dL (ref 8–27)
CO2: 25 mmol/L (ref 20–29)
Calcium: 8.7 mg/dL (ref 8.6–10.2)
Chloride: 99 mmol/L (ref 96–106)
Creatinine, Ser: 1.23 mg/dL (ref 0.76–1.27)
Glucose: 142 mg/dL — ABNORMAL HIGH (ref 70–99)
Potassium: 4 mmol/L (ref 3.5–5.2)
Sodium: 141 mmol/L (ref 134–144)
eGFR: 66 mL/min/{1.73_m2} (ref >59)

## 2023-03-12 LAB — ALDOSTERONE + RENIN ACTIVITY W/ RATIO
ALDO / PRA Ratio: 25 Ratio (ref 0.9–28.9)
Aldosterone: 3 ng/dL
Renin Activity: 0.12 ng/mL/h — ABNORMAL LOW (ref 0.25–5.82)

## 2023-03-15 ENCOUNTER — Ambulatory Visit (HOSPITAL_BASED_OUTPATIENT_CLINIC_OR_DEPARTMENT_OTHER)
Admission: RE | Admit: 2023-03-15 | Discharge: 2023-03-15 | Disposition: A | Discharge status: Home / Self Care | Payer: 59 | Source: Ambulatory Visit | Attending: Cardiovascular Disease | Admitting: Cardiovascular Disease

## 2023-03-15 DIAGNOSIS — I480 Paroxysmal atrial fibrillation: Secondary | ICD-10-CM | POA: Diagnosis present

## 2023-03-15 MED ORDER — IOHEXOL 350 MG/ML SOLN
100.0000 mL | Freq: Once | INTRAVENOUS | Status: AC | PRN
  Administered 2023-03-15: 80 mL via INTRAVENOUS

## 2023-03-17 ENCOUNTER — Encounter: Payer: Self-pay | Admitting: Internal Medicine

## 2023-03-17 ENCOUNTER — Ambulatory Visit: Payer: 59 | Admitting: Internal Medicine

## 2023-03-17 VITALS — BP 108/82 | HR 105 | Temp 97.6°F | Ht 69.0 in | Wt 204.6 lb

## 2023-03-17 DIAGNOSIS — Z1211 Encounter for screening for malignant neoplasm of colon: Secondary | ICD-10-CM

## 2023-03-17 DIAGNOSIS — H6123 Impacted cerumen, bilateral: Secondary | ICD-10-CM

## 2023-03-17 DIAGNOSIS — I480 Paroxysmal atrial fibrillation: Secondary | ICD-10-CM

## 2023-03-17 DIAGNOSIS — R7303 Prediabetes: Secondary | ICD-10-CM | POA: Insufficient documentation

## 2023-03-17 DIAGNOSIS — I2510 Atherosclerotic heart disease of native coronary artery without angina pectoris: Secondary | ICD-10-CM

## 2023-03-17 DIAGNOSIS — F17200 Nicotine dependence, unspecified, uncomplicated: Secondary | ICD-10-CM | POA: Diagnosis not present

## 2023-03-17 DIAGNOSIS — E559 Vitamin D deficiency, unspecified: Secondary | ICD-10-CM | POA: Diagnosis not present

## 2023-03-17 DIAGNOSIS — R0981 Nasal congestion: Secondary | ICD-10-CM | POA: Diagnosis not present

## 2023-03-17 DIAGNOSIS — I1A0 Resistant hypertension: Secondary | ICD-10-CM

## 2023-03-17 MED ORDER — VITAMIN D-3 125 MCG (5000 UT) PO TABS: 5000.0000 [IU] | ORAL_TABLET | Freq: Every day | ORAL | 3 refills | Status: AC

## 2023-03-17 MED ORDER — DEBROX 6.5 % OT SOLN: 5.0000 [drp] | Freq: Two times a day (BID) | OTIC | 0 refills | Status: DC

## 2023-03-17 MED ORDER — NICOTINE 21 MG/24HR TD PT24: 21.0000 mg | MEDICATED_PATCH | Freq: Every day | TRANSDERMAL | 11 refills | Status: DC

## 2023-03-17 MED ORDER — EZETIMIBE 10 MG PO TABS: 10.0000 mg | ORAL_TABLET | Freq: Every day | ORAL | 3 refills | Status: DC

## 2023-03-17 MED ORDER — LOSARTAN POTASSIUM 100 MG PO TABS: 100.0000 mg | ORAL_TABLET | Freq: Every day | ORAL | 3 refills | Status: DC

## 2023-03-17 MED ORDER — MULTI-VITAMIN/MINERALS PO TABS: 1.0000 | ORAL_TABLET | Freq: Every day | ORAL | 3 refills | Status: AC

## 2023-03-17 MED ORDER — MONTELUKAST SODIUM 10 MG PO TABS: 10.0000 mg | ORAL_TABLET | Freq: Every day | ORAL | 3 refills | Status: DC

## 2023-03-17 NOTE — Progress Notes (Signed)
[facility]: [phone]   Routine Medical Office Visit  Patient:  Jesse MARTINEAU [name]      Age: 62 y.o.       Sex:  male  Date:   03/17/2023 Patient Care Team: Lula Olszewski, MD as PCP - General (Internal Medicine) Marinus Maw, MD as PCP - Electrophysiology (Cardiology) Reather Littler, MD as Consulting Physician (Endocrinology) Marinus Maw, MD as Consulting Physician (Cardiology) Jeani Hawking, MD as Consulting Physician (Gastroenterology) Today's Healthcare Provider: Lula Olszewski, MD   Assessment and Plan:    [name] was seen today for 3 week follow-up.  Coronary artery disease involving native coronary artery of native heart without angina pectoris Overview: Calcified on CT 03/2023 Taking rosuvastatin 40, no myalgia  Medications: not yet discussed Lab Results  Component Value Date   HDL 52.70 03/31/2022   HDL 40.40 06/26/2021   HDL 46.00 08/13/2020   CHOLHDL 3 03/31/2022   CHOLHDL 5 06/26/2021   CHOLHDL 4 08/13/2020   Lab Results  Component Value Date   LDLCALC 84 03/31/2022   LDLCALC 106 (H) 06/26/2021   LDLCALC 70 09/25/2019   LDLDIRECT 102.0 08/13/2020   Lab Results  Component Value Date   TRIG 155.0 (H) 03/31/2022   TRIG 190.0 (H) 06/26/2021   TRIG 208.0 (H) 08/13/2020   Lab Results  Component Value Date   CHOL 167 03/31/2022   CHOL 185 06/26/2021   CHOL 173 08/13/2020   The 10-year ASCVD risk score (Arnett DK, et al., 2019) is: 12.4%   Values used to calculate the score:     Age: 46 years     Sex: Male     Is Non-Hispanic African American: No     Diabetic: No     Tobacco smoker: Yes     Systolic Blood Pressure: 108 mmHg     Is BP treated: Yes     HDL Cholesterol: 52.7 mg/dL     Total Cholesterol: 167 mg/dL Lab Results  Component Value Date   ALT 32 10/19/2022   AST 22 10/19/2022   ALKPHOS 119 10/19/2022   TSH 8.05 (H) 03/01/2023   HGBA1C 6.1 (A) 03/04/2023   Body mass index is 30.21 kg/m.   Lipoprotein(a), Apolipoprotein B (ApoB), and High-sensitivity C-reactive protein (hs-CRP) No results found for: "HSCRP", "LIPOA"    Assessment & Plan: Coronary Artery Disease: CT scan shows mild scattered calcific plaques. We discussed the risk of plaque rupture leading to myocardial infarction and stressed the importance of lifestyle modifications and medication adherence. He will continue Rosuvastatin 40mg  daily, add Ezetimibe (Zetia) to further lower LDL cholesterol, follow a Mediterranean diet, limit saturated fats and sugars, increase omega-3 fatty acid intake, and were encouraged to quit smoking using nicotine patches.  Orders: -     Multi-Vitamin/Minerals; Take 1 tablet by mouth daily.  Dispense: 90 tablet; Refill: 3 -     Ezetimibe; Take 1 tablet (10 mg total) by mouth daily.  Dispense: 90 tablet; Refill: 3  Smoking -     Nicotine; Place 1 patch (21 mg total) onto the skin daily.  Dispense: 28 patch; Refill: 11 -     Multi-Vitamin/Minerals; Take 1 tablet by mouth daily.  Dispense: 90 tablet; Refill: 3  Screening for colon cancer -     Cologuard -     Multi-Vitamin/Minerals; Take 1 tablet by mouth daily.  Dispense: 90 tablet; Refill: 3  Nasal congestion -     Montelukast Sodium; Take 1 tablet (10 mg total)  by mouth at bedtime. TAKE 1 TABLET BY MOUTH EVERYDAY AT BEDTIME Strength: 10 mg  Dispense: 90 tablet; Refill: 3 -     Multi-Vitamin/Minerals; Take 1 tablet by mouth daily.  Dispense: 90 tablet; Refill: 3  Vitamin D deficiency -     Vitamin D-3; Take 5,000 Units by mouth daily.  Dispense: 90 tablet; Refill: 3 -     Multi-Vitamin/Minerals; Take 1 tablet by mouth daily.  Dispense: 90 tablet; Refill: 3  Resistant hypertension Overview: Interim history from February 17, 2023:    Home readings: high at home and high here too. Patient reports taking current medications consistently and not experiencing any significant associated side effects or symptoms. Current hypertension  medications:       Sig   cloNIDine (CATAPRES - DOSED IN MG/24 HR) 0.1 mg/24hr patch (Taking) Place 1 patch (0.1 mg total) onto the skin once a week.   losartan (COZAAR) 100 MG tablet (Taking) Take 1 tablet (100 mg total) by mouth daily.   metoprolol tartrate (LOPRESSOR) 50 MG tablet Take 1 tablet (50 mg total) by mouth 2 (two) times daily.      Lab Results  Component Value Date   NA 139 11/30/2022   K 3.9 11/30/2022   CREATININE 1.11 11/30/2022   GFR 70.44 11/30/2022     Assessment & Plan: Hypertension: His blood pressure is well controlled on the current regimen. He will continue Spironolactone 1 tablet daily and consider increasing to 2 tablets daily if blood pressure increases.   Blood pressure has been lower lately. Ok to stop torsemide. Follow up soon. He is currently off clonidine for today and good blood pressure anyway  Orders: -     Losartan Potassium; Take 1 tablet (100 mg total) by mouth daily.  Dispense: 90 tablet; Refill: 3 -     Multi-Vitamin/Minerals; Take 1 tablet by mouth daily.  Dispense: 90 tablet; Refill: 3  Bilateral hearing loss due to cerumen impaction -     Debrox; Place 5 drops into both ears 2 (two) times daily.  Dispense: 15 mL; Refill: 0  Paroxysmal atrial fibrillation with RVR (HCC) Assessment & Plan: Atrial Fibrillation: I independently reviewed the CT coronary with the patient showing him his heart images and explaining how to understand the atherosclerosis and what the ablation would do.  The upper left chamber appears normal with no blood clot present. We discussed the risk of stroke and the importance of anticoagulation, and he will continue Xarelto as prescribed, even after ablation.   Prediabetes Overview: Lab Results  Component Value Date   HGBA1C 6.1 (A) 03/04/2023     Assessment & Plan: Prediabetes: Mild prediabetes was noted. He was encouraged to lose weight and adhere to a Mediterranean diet.     PROCEDURE: CERUMEN DISIMPACTION   Ear Irrigation: We discussed the risk of tympanic membrane perforation and will obtain consent for ear irrigation. Otoscopic viewing of the tympanic membrane was initially obstructed by copious impacted cerumen in the external auditory canal, so disimpaction by irrigation was recommended.  The associated and risk of tympanic membrane perforation was discussed and verbal consent was obtained prior to performing the procedure. The affected bilateral auditory canal(s) were then irrigated by gentle ear lavage with successful removal of impacted cerumen as confirmed on post procedural repeat otoscopy.  This was performed by certified medical assistant  and verified by MD. The external auditory canal was still filled with hard whitish cerumen, while auditory canals were normal.   Patient tolerated well without  complications.      Medical Decision Making: 2 or more stable chronic illnesses Independent interpretation of a test performed by another physician; imaging such as XR or EKG reinterpretations Prescription drug management     Recommended follow up:  2 weeks to re-irrigate ears after debrox. 3 months for check effectivenes of zetia Future Appointments  Date Time Provider Department Center  03/31/2023  1:20 PM Lula Olszewski, MD [facility]  04/19/2023 11:00 AM Alphonzo Severance R, PA [facility] None  06/07/2023  9:20 AM [place], MD [facility] None  06/22/2023  2:45 PM Mealor, Roberts Gaudy, MD [facility]  06/24/2023  2:00 PM Lula Olszewski, MD [facility]           Clinical Presentation:    62 y.o. male who has Paroxysmal atrial fibrillation with RVR (HCC); Resistant hypertension; Dyslipidemia; Depression with anxiety; ED (erectile dysfunction); Hx of Bell's palsy; Amputated toe (HCC); Cigarette smoker; Bone neoplasm; PVC's (premature ventricular contractions); COPD GOLD 0/ emphysema on CT ; Vitamin D deficiency; Iron deficiency anemia; Tobacco use disorder; Mood disorder (HCC);  Prolonged QT interval; Lumbar degenerative disc disease; Bladder dysfunction; Seasonal allergies; Major depressive disorder, single episode, in remission (HCC); Acute idiopathic gout of multiple sites; Hypothyroidism; Healthcare maintenance; Hyperlipidemia; Hypokalemia; CAD (coronary artery disease); and Prediabetes on their problem list. His reasons/main concerns/chief complaints for today's office visit are 3 week follow-up   AI-Extracted: Discussed the use of AI scribe software for clinical note transcription with the patient, who gave verbal consent to proceed.  History of Present Illness   The patient, with a known history of atrial fibrillation (AFib), presented for a follow-up consultation regarding a recent CT scan of the heart. The patient expressed concern about the results of the scan and was particularly interested in understanding the implications of the findings. The patient reported no recent exacerbation of AFib symptoms and denied experiencing any chest pain.  The patient has been on a regimen of multiple medications, including Xarelto, a potent anticoagulant, for the management of AFib. The patient also reported taking Imdur for blood pressure control, Spironolactone, and Torsimide, although there was no recent fluid accumulation in the legs. The patient has been on rosuvastatin for cholesterol management but expressed concerns about the number of medications being taken.  The patient has a history of smoking and is currently using nicotine patches to aid in cessation. Despite this, the patient admitted to still smoking occasionally. The patient also reported some difficulty with hearing, suspecting wax build-up in the ears.  The patient's blood pressure readings have been consistently good, as monitored at the local fire department. The patient also reported taking Synthroid, managed by an endocrinologist, and Prozac occasionally for mood regulation. The patient has been prescribed  Celebrex for back pain but has chosen not to take it due to a dislike for taking multiple medications.  The patient expressed understanding of the procedure and appeared optimistic about the potential outcomes. The patient also expressed appreciation for the care received and showed a willingness to make lifestyle changes, including dietary modifications, to improve overall health.     Reviewed chart data: Past Medical History:  Diagnosis Date   Acute hypoxemic respiratory failure (HCC) 09/18/2022   Allergy    Anxiety    Atrial fibrillation (HCC)    Atrial fibrillation with RVR (HCC) 09/12/2022   Bell palsy    Depression    Diastolic heart failure    HAP (hospital-acquired pneumonia) 09/17/2022   Hypercholesterolemia    Hypertension  Pneumonia    Pneumonia of right lower lobe due to infectious organism 09/17/2022   Thyroid disease     Outpatient Medications Prior to Visit  Medication Sig   amiodarone (PACERONE) 200 MG tablet Take 1 tablet (200 mg total) by mouth daily. (Patient taking differently: Take 200-400 mg by mouth See admin instructions. 400 mg twice a day for five day.  200 mg one a day for 25 days)   cloNIDine (CATAPRES - DOSED IN MG/24 HR) 0.1 mg/24hr patch Place 1 patch (0.1 mg total) onto the skin once a week.   fluticasone (FLONASE) 50 MCG/ACT nasal spray Place 2 sprays into both nostrils daily.   isosorbide mononitrate (IMDUR) 30 MG 24 hr tablet Take 1 tablet (30 mg total) by mouth daily. Cannot take levitra with.   levothyroxine (SYNTHROID) 200 MCG tablet Take 1 tablet (200 mcg total) by mouth daily. Take as recommended by endocrine.  200 mg daily except for Sunday, take 100 mg. (Patient taking differently: Take 100-200 mcg by mouth See admin instructions. Take as recommended by endocrine.  200 mg daily except for Sunday, take 100 mg.)   loratadine (CLARITIN) 10 MG tablet Take 1 tablet (10 mg total) by mouth daily.   metoprolol tartrate (LOPRESSOR) 50 MG tablet Take 1  tablet (50 mg total) by mouth 2 (two) times daily.   rivaroxaban (XARELTO) 20 MG TABS tablet Take 1 tablet (20 mg total) by mouth daily with supper.   rosuvastatin (CRESTOR) 40 MG tablet Take 1 tablet (40 mg total) by mouth daily. Replaces 20 mg dose   Saline (SIMPLY SALINE) 0.9 % AERS Place 2 each into the nose as directed. Use nightly for sinus hygiene long-term.  Can also be used as many times daily as desired to assist with clearing congested sinuses.   spironolactone (ALDACTONE) 25 MG tablet Take 1 tablet (25 mg total) by mouth daily.   tamsulosin (FLOMAX) 0.4 MG CAPS capsule Take 1 capsule (0.4 mg total) by mouth daily.   [DISCONTINUED] amoxicillin-clavulanate (AUGMENTIN) 875-125 MG tablet Take 1 tablet by mouth 2 (two) times daily.   [DISCONTINUED] celecoxib (CELEBREX) 100 MG capsule Take 1 capsule (100 mg total) by mouth 2 (two) times daily.   [DISCONTINUED] Cholecalciferol (VITAMIN D-3) 125 MCG (5000 UT) TABS Take 5,000 Units by mouth daily.   [DISCONTINUED] cyclobenzaprine (FLEXERIL) 10 MG tablet Take 1 tablet (10 mg total) by mouth 3 (three) times daily as needed for muscle spasms.   [DISCONTINUED] FLUoxetine (PROZAC) 20 MG tablet TAKE 1 TABLET BY MOUTH EVERY DAY   [DISCONTINUED] fluticasone (FLONASE) 50 MCG/ACT nasal spray Place 2 sprays into both nostrils daily as needed for allergies.   [DISCONTINUED] losartan (COZAAR) 100 MG tablet TAKE 1 TABLET BY MOUTH EVERY DAY   [DISCONTINUED] montelukast (SINGULAIR) 10 MG tablet Take 1 tablet (10 mg total) by mouth at bedtime. TAKE 1 TABLET BY MOUTH EVERYDAY AT BEDTIME Strength: 10 mg   [DISCONTINUED] Multiple Vitamins-Minerals (MULTIVITAMIN WITH MINERALS) tablet Take 1 tablet by mouth daily.   [DISCONTINUED] nicotine (NICODERM CQ - DOSED IN MG/24 HOURS) 21 mg/24hr patch Place 1 patch (21 mg total) onto the skin daily.   [DISCONTINUED] torsemide (DEMADEX) 10 MG tablet Take 1 tablet (10 mg total) by mouth daily.   No facility-administered  medications prior to visit.         Clinical Data Analysis:   Physical Exam  BP 108/82 (BP Location: Left Arm, Patient Position: Sitting)   Pulse (!) 105   Temp 97.6 F (36.4  C) (Temporal)   Ht 5\' 9"  (1.753 m)   Wt 204 lb 9.6 oz (92.8 kg)   SpO2 94%   BMI 30.21 kg/m  Wt Readings from Last 10 Encounters:  03/17/23 204 lb 9.6 oz (92.8 kg)  03/04/23 205 lb (93 kg)  02/25/23 208 lb 9.6 oz (94.6 kg)  02/17/23 205 lb 3.2 oz (93.1 kg)  12/03/22 207 lb 12.8 oz (94.3 kg)  11/26/22 209 lb (94.8 kg)  11/05/22 210 lb 3.2 oz (95.3 kg)  10/29/22 205 lb 3.2 oz (93.1 kg)  10/22/22 197 lb (89.4 kg)  10/08/22 199 lb 3.2 oz (90.4 kg)   Vital signs reviewed.  Nursing notes reviewed. Weight trend reviewed. Abnormalities and Problem-Specific physical exam findings:  cerumen impactions  General Appearance:  No acute distress appreciable.   Well-groomed, healthy-appearing male.  Well proportioned with no abnormal fat distribution.  Good muscle tone. Skin: Clear and well-hydrated. Pulmonary:  Normal work of breathing at rest, no respiratory distress apparent. SpO2: 94 %  Neurological:  Awake, alert, oriented, and engaged.  No obvious focal neurological deficits or cognitive impairments.  Sensorium seems unclouded.   Speech is clear and coherent with logical content. Psychiatric:  Appropriate mood, pleasant and cooperative demeanor, thoughtful and engaged during the exam  Results Reviewed:    No results found for any visits on 03/17/23.  Lab on 03/08/2023  Component Date Value   Glucose 03/08/2023 142 (H)    BUN 03/08/2023 12    Creatinine, Ser 03/08/2023 1.23    eGFR 03/08/2023 66    BUN/Creatinine Ratio 03/08/2023 10    Sodium 03/08/2023 141    Potassium 03/08/2023 4.0    Chloride 03/08/2023 99    CO2 03/08/2023 25    Calcium 03/08/2023 8.7    WBC 03/08/2023 10.0    RBC 03/08/2023 5.82 (H)    Hemoglobin 03/08/2023 16.9    Hematocrit 03/08/2023 53.8 (H)    MCV 03/08/2023 92    MCH  03/08/2023 29.0    MCHC 03/08/2023 31.4 (L)    RDW 03/08/2023 14.4    Platelets 03/08/2023 209   Office Visit on 03/04/2023  Component Date Value   Hemoglobin A1C 03/04/2023 6.1 (A)    POC Glucose 03/04/2023 134 (A)   Lab on 03/01/2023  Component Date Value   Free T4 03/01/2023 1.12    TSH 03/01/2023 8.05 (H)    Sodium 03/01/2023 136    Potassium 03/01/2023 3.2 (L)    Chloride 03/01/2023 94 (L)    CO2 03/01/2023 31    Glucose, Bld 03/01/2023 182 (H)    BUN 03/01/2023 14    Creatinine, Ser 03/01/2023 1.30    GFR 03/01/2023 58.18 (L)    Calcium 03/01/2023 9.0   Office Visit on 02/25/2023  Component Date Value   Sodium 02/25/2023 137    Potassium 02/25/2023 4.2    Chloride 02/25/2023 100    CO2 02/25/2023 29    Glucose, Bld 02/25/2023 119 (H)    BUN 02/25/2023 16    Creatinine, Ser 02/25/2023 1.15    GFR 02/25/2023 67.40    Calcium 02/25/2023 8.9    Aldosterone 02/25/2023 3    Renin Activity 02/25/2023 0.12 (L)    ALDO / PRA Ratio 02/25/2023 25.0    Total Volume 03/01/2023 1,500    METANEPHRINE 03/01/2023 59 (L)    NORMETANEPHRINE 03/01/2023 249    METANEPHRINES, TOTAL 03/01/2023 308   Lab on 11/30/2022  Component Date Value   Free T4 11/30/2022 1.52  TSH 11/30/2022 0.22 (L)    Sodium 11/30/2022 139    Potassium 11/30/2022 3.9    Chloride 11/30/2022 100    CO2 11/30/2022 30    Glucose, Bld 11/30/2022 138 (H)    BUN 11/30/2022 12    Creatinine, Ser 11/30/2022 1.11    GFR 11/30/2022 70.44    Calcium 11/30/2022 9.1   Lab on 10/19/2022  Component Date Value   Glucose 10/19/2022 130 (H)    BUN 10/19/2022 10    Creatinine, Ser 10/19/2022 0.83    eGFR 10/19/2022 98    BUN/Creatinine Ratio 10/19/2022 12    Sodium 10/19/2022 140    Potassium 10/19/2022 3.9    Chloride 10/19/2022 100    CO2 10/19/2022 26    Calcium 10/19/2022 9.0    Total Protein 10/19/2022 6.6    Albumin 10/19/2022 4.3    Globulin, Total 10/19/2022 2.3    Albumin/Globulin Ratio 10/19/2022 1.9     Bilirubin Total 10/19/2022 0.5    Alkaline Phosphatase 10/19/2022 119    AST 10/19/2022 22    ALT 10/19/2022 32    TSH 10/19/2022 0.643    Free T4 10/19/2022 2.27 (H)    WBC 10/19/2022 9.4    RBC 10/19/2022 4.84    Hemoglobin 10/19/2022 14.7    Hematocrit 10/19/2022 44.7    MCV 10/19/2022 92    MCH 10/19/2022 30.4    MCHC 10/19/2022 32.9    RDW 10/19/2022 13.6    Platelets 10/19/2022 175   No results displayed because visit has over 200 results.    Admission on 09/11/2022, Discharged on 09/15/2022  Component Date Value   Sodium 09/11/2022 137    Potassium 09/11/2022 4.3    Chloride 09/11/2022 101    CO2 09/11/2022 28    Glucose, Bld 09/11/2022 99    BUN 09/11/2022 12    Creatinine, Ser 09/11/2022 0.95    Calcium 09/11/2022 8.8 (L)    GFR, Estimated 09/11/2022 >60    Anion gap 09/11/2022 8    WBC 09/11/2022 10.4    RBC 09/11/2022 4.77    Hemoglobin 09/11/2022 14.7    HCT 09/11/2022 44.7    MCV 09/11/2022 93.7    MCH 09/11/2022 30.8    MCHC 09/11/2022 32.9    RDW 09/11/2022 13.0    Platelets 09/11/2022 188    nRBC 09/11/2022 0.0    Troponin I (High Sensiti* 09/11/2022 5    Troponin I (High Sensiti* 09/11/2022 5    SARS Coronavirus 2 by RT* 09/11/2022 NEGATIVE    Influenza A by PCR 09/11/2022 NEGATIVE    Influenza B by PCR 09/11/2022 NEGATIVE    Resp Syncytial Virus by * 09/11/2022 NEGATIVE    B Natriuretic Peptide 09/11/2022 225.9 (H)    Weight 09/12/2022 3,088    Height 09/12/2022 69    BP 09/12/2022 109/95    S' Lateral 09/12/2022 3.58    WBC 09/12/2022 10.9 (H)    RBC 09/12/2022 4.65    Hemoglobin 09/12/2022 14.2    HCT 09/12/2022 43.7    MCV 09/12/2022 94.0    MCH 09/12/2022 30.5    MCHC 09/12/2022 32.5    RDW 09/12/2022 13.1    Platelets 09/12/2022 190    nRBC 09/12/2022 0.0    Sodium 09/12/2022 138    Potassium 09/12/2022 3.7    Chloride 09/12/2022 102    CO2 09/12/2022 26    Glucose, Bld 09/12/2022 108 (H)    BUN 09/12/2022 13    Creatinine,  Ser 09/12/2022 0.95  Calcium 09/12/2022 8.5 (L)    GFR, Estimated 09/12/2022 >60    Anion gap 09/12/2022 10    Adenovirus 09/12/2022 NOT DETECTED    Coronavirus 229E 09/12/2022 NOT DETECTED    Coronavirus HKU1 09/12/2022 NOT DETECTED    Coronavirus NL63 09/12/2022 NOT DETECTED    Coronavirus OC43 09/12/2022 NOT DETECTED    Metapneumovirus 09/12/2022 NOT DETECTED    Rhinovirus / Enterovirus 09/12/2022 DETECTED (A)    Influenza A 09/12/2022 NOT DETECTED    Influenza B 09/12/2022 NOT DETECTED    Parainfluenza Virus 1 09/12/2022 NOT DETECTED    Parainfluenza Virus 2 09/12/2022 NOT DETECTED    Parainfluenza Virus 3 09/12/2022 NOT DETECTED    Parainfluenza Virus 4 09/12/2022 NOT DETECTED    Respiratory Syncytial Vi* 09/12/2022 NOT DETECTED    Bordetella pertussis 09/12/2022 NOT DETECTED    Bordetella Parapertussis 09/12/2022 NOT DETECTED    Chlamydophila pneumoniae 09/12/2022 NOT DETECTED    Mycoplasma pneumoniae 09/12/2022 NOT DETECTED    WBC 09/13/2022 12.4 (H)    RBC 09/13/2022 4.59    Hemoglobin 09/13/2022 14.1    HCT 09/13/2022 42.6    MCV 09/13/2022 92.8    MCH 09/13/2022 30.7    MCHC 09/13/2022 33.1    RDW 09/13/2022 13.0    Platelets 09/13/2022 210    nRBC 09/13/2022 0.0    Neutrophils Relative % 09/13/2022 60    Neutro Abs 09/13/2022 7.4    Lymphocytes Relative 09/13/2022 24    Lymphs Abs 09/13/2022 3.0    Monocytes Relative 09/13/2022 15    Monocytes Absolute 09/13/2022 1.8 (H)    Eosinophils Relative 09/13/2022 1    Eosinophils Absolute 09/13/2022 0.1    Basophils Relative 09/13/2022 0    Basophils Absolute 09/13/2022 0.0    Immature Granulocytes 09/13/2022 0    Abs Immature Granulocytes 09/13/2022 0.04    Sodium 09/13/2022 132 (L)    Potassium 09/13/2022 3.6    Chloride 09/13/2022 97 (L)    CO2 09/13/2022 25    Glucose, Bld 09/13/2022 118 (H)    BUN 09/13/2022 9    Creatinine, Ser 09/13/2022 0.90    Calcium 09/13/2022 8.5 (L)    Total Protein 09/13/2022 6.0  (L)    Albumin 09/13/2022 3.4 (L)    AST 09/13/2022 29    ALT 09/13/2022 35    Alkaline Phosphatase 09/13/2022 93    Total Bilirubin 09/13/2022 0.7    GFR, Estimated 09/13/2022 >60    Anion gap 09/13/2022 10    Magnesium 09/13/2022 1.7    Phosphorus 09/13/2022 4.0    Sodium 09/14/2022 131 (L)    Potassium 09/14/2022 4.3    Chloride 09/14/2022 96 (L)    CO2 09/14/2022 25    Glucose, Bld 09/14/2022 115 (H)    BUN 09/14/2022 6 (L)    Creatinine, Ser 09/14/2022 0.86    Calcium 09/14/2022 8.7 (L)    GFR, Estimated 09/14/2022 >60    Anion gap 09/14/2022 10    WBC 09/14/2022 15.8 (H)    RBC 09/14/2022 4.69    Hemoglobin 09/14/2022 14.6    HCT 09/14/2022 42.3    MCV 09/14/2022 90.2    MCH 09/14/2022 31.1    MCHC 09/14/2022 34.5    RDW 09/14/2022 12.7    Platelets 09/14/2022 201    nRBC 09/14/2022 0.0    Magnesium 09/14/2022 1.7    Phosphorus 09/14/2022 4.0    Prothrombin Time 09/14/2022 21.3 (H)    INR 09/14/2022 1.9 (H)    Sodium 09/15/2022 135  Potassium 09/15/2022 3.5    Chloride 09/15/2022 99    CO2 09/15/2022 25    Glucose, Bld 09/15/2022 96    BUN 09/15/2022 8    Creatinine, Ser 09/15/2022 1.11    Calcium 09/15/2022 8.7 (L)    GFR, Estimated 09/15/2022 >60    Anion gap 09/15/2022 11    WBC 09/15/2022 15.2 (H)    RBC 09/15/2022 4.53    Hemoglobin 09/15/2022 13.8    HCT 09/15/2022 41.9    MCV 09/15/2022 92.5    MCH 09/15/2022 30.5    MCHC 09/15/2022 32.9    RDW 09/15/2022 12.8    Platelets 09/15/2022 208    nRBC 09/15/2022 0.0    Magnesium 09/15/2022 1.7    Phosphorus 09/15/2022 3.8   Lab on 08/31/2022  Component Date Value   Sodium 08/31/2022 138    Potassium 08/31/2022 4.2    Chloride 08/31/2022 100    CO2 08/31/2022 29    Glucose, Bld 08/31/2022 110 (H)    BUN 08/31/2022 13    Creatinine, Ser 08/31/2022 0.95    GFR 08/31/2022 85.06    Calcium 08/31/2022 9.6    Free T4 08/31/2022 1.39    TSH 08/31/2022 0.26 (L)   Lab on 07/06/2022  Component Date  Value   Sodium 07/06/2022 135    Potassium 07/06/2022 4.8    Chloride 07/06/2022 100    CO2 07/06/2022 29    Glucose, Bld 07/06/2022 112 (H)    BUN 07/06/2022 11    Creatinine, Ser 07/06/2022 0.78    GFR 07/06/2022 94.87    Calcium 07/06/2022 9.3    Free T4 07/06/2022 0.93    TSH 07/06/2022 8.41 (H)   There may be more visits with results that are not included.   No image results found.   CT CARDIAC MORPH/PULM VEIN W/CM&W/O CA SCORE  Result Date: 03/15/2023 CLINICAL DATA:  Atrial fibrillation scheduled for ablation. EXAM: Cardiac CTA TECHNIQUE: A non-contrast, gated CT scan was obtained with axial slices of 3 mm through the heart for calcium scoring. Calcium scoring was performed using the Agatston method. A 120 kV retrospective, gated, contrast cardiac scan was obtained. Gantry rotation speed was 250 msecs and collimation was 0.6 mm. Nitroglycerin was not given. A delayed scan was obtained 60 seconds after contrast injection to exclude left atrial appendage thrombus. The 3D dataset was reconstructed in 5% intervals of the 0-95% of the R-R cycle. Systolic and diastolic phases were analyzed on a dedicated workstation using MPR, MIP, and VRT modes. The patient received 80 cc of contrast. FINDINGS: Image quality: Fair Noise artifact is: Limited Pulmonary Veins: There is normal pulmonary vein drainage into the left atrium (2 on the right and 2 on the left) with ostial measurements as follows: RUPV: Ostium 20 x 17 mm  area 2.6 cm2 RLPV:  Ostium 13 x 10 mm  area 1.0 cm2 LUPV:  Ostium 20 x 15 mm area 2.3 cm2 LLPV:  Ostium 19 x 16 mm  area 2.5 cm2 Left Atrium: The left atrial size is normal. There is no PFO/ASD. There is no thrombus in the left atrial appendage on contrast or delayed imaging. The esophagus runs in the left atrial midline and is not in proximity to any of the pulmonary vein ostia. Coronary Arteries: Mild scattered calcified plaque. Unable to quantify due to contrast present in coronary  arteries. Right dominance. The study was performed without use of NTG and is insufficient for plaque evaluation. Pericardium: Normal thickness with no significant effusion  or calcium present. Pulmonary Artery: Normal caliber without proximal filling defect. Cardiac valves: The aortic valve is trileaflet without significant calcification. The mitral valve is normal structure without significant calcification. Aorta: Normal caliber with no significant disease. Extra-cardiac findings: See attached radiology report for non-cardiac structures. Elevated right hemidiaphragm. IMPRESSION: 1. There is normal pulmonary vein drainage into the left atrium with ostial measurements above. 2. There is no thrombus in the left atrial appendage. 3. The esophagus runs in the left atrial midline and is not in proximity to any of the pulmonary vein ostia. 4. No PFO/ASD. 5. Normal coronary origin. Right dominance. 6. Elevated right hemidiaphragm. Electronically Signed   By: Donato Schultz M.D.   On: 03/15/2023 14:16       This encounter employed real-time, collaborative documentation. The patient actively reviewed and updated their medical record on a shared screen, ensuring transparency and facilitating joint problem-solving for the problem list, overview, and plan. This approach promotes accurate, informed care. The treatment plan was discussed and reviewed in detail, including medication safety, potential side effects, and all patient questions. We confirmed understanding and comfort with the plan. Follow-up instructions were established, including contacting the office for any concerns, returning if symptoms worsen, persist, or new symptoms develop, and precautions for potential emergency department visits. ----------------------------------------------------- Lula Olszewski, MD  03/17/2023 4:05 PM  [facility]:  [phone]

## 2023-03-17 NOTE — Assessment & Plan Note (Signed)
Atrial Fibrillation: I independently reviewed the CT coronary with the patient showing him his heart images and explaining how to understand the atherosclerosis and what the ablation would do.  The upper left chamber appears normal with no blood clot present. We discussed the risk of stroke and the importance of anticoagulation, and he will continue Xarelto as prescribed, even after ablation.

## 2023-03-17 NOTE — Assessment & Plan Note (Addendum)
Hypertension: His blood pressure is well controlled on the current regimen. He will continue Spironolactone 1 tablet daily and consider increasing to 2 tablets daily if blood pressure increases.   Blood pressure has been lower lately. Ok to stop torsemide. Follow up soon. He is currently off clonidine for today and good blood pressure anyway

## 2023-03-17 NOTE — Assessment & Plan Note (Signed)
Coronary Artery Disease: CT scan shows mild scattered calcific plaques. We discussed the risk of plaque rupture leading to myocardial infarction and stressed the importance of lifestyle modifications and medication adherence. He will continue Rosuvastatin 40mg  daily, add Ezetimibe (Zetia) to further lower LDL cholesterol, follow a Mediterranean diet, limit saturated fats and sugars, increase omega-3 fatty acid intake, and were encouraged to quit smoking using nicotine patches.

## 2023-03-17 NOTE — Patient Instructions (Addendum)
It was a pleasure seeing you today! Your health and satisfaction are our top priorities.  Glenetta Hew, MD  Your Providers PCP: Lula Olszewski, MD,  [phone]) Referring Provider: Lula Olszewski, MD,  [phone]) Care Team Provider: Reather Littler, MD,  [phone]) Care Team Provider: Marinus Maw, MD,  [phone]) Care Team Provider: Jeani Hawking, MD,  [phone]) Care Team Provider: Marinus Maw, MD,  [phone])  VISIT SUMMARY:  During your recent visit, we discussed your atrial fibrillation (an irregular heartbeat that can lead to blood clots, stroke, heart failure, and other heart-related complications), coronary artery disease (a type of heart disease that happens when your coronary arteries--the blood vessels that supply your heart with blood, oxygen, and nutrients--become damaged or diseased), hypertension (high blood pressure), and prediabetes (a condition in which blood sugar levels are higher than normal, but not high enough to be classified as diabetes). We also talked about your concerns regarding the number of medications you are taking and your occasional smoking habit.  YOUR PLAN:  -ATRIAL FIBRILLATION: Your heart's upper left chamber appears normal with no blood clot present. We discussed the risk of stroke and the importance of anticoagulation. You will continue taking Xarelto as prescribed.  -CORONARY ARTERY DISEASE: Your CT scan shows mild scattered calcific plaques, which are deposits of calcium in your arteries. We discussed the risk of plaque rupture leading to a heart attack. You will continue taking Rosuvastatin daily, add Ezetimibe to further lower your LDL cholesterol, follow a Mediterranean diet, limit saturated fats and sugars, increase omega-3 fatty acid intake, and were encouraged to quit smoking using nicotine patches.  Improving Your Cholesterol: Diet: Focus on a Mediterranean-style diet, limit saturated fats and sugars, and increase  omega-3 fatty acids (fish, flaxseeds,nuts,extra virgin olive oil, avocados). Exercise: Engage in regular physical activity (aerobic exercises are particularly beneficial for HDL). Weight Management: Maintain a healthy weight. Smoking Cessation: Quitting smoking improves cholesterol levels.  -HYPERTENSION: Your blood pressure is well controlled on the current regimen. You will continue taking Spironolactone daily and consider increasing to 2 tablets daily if your blood pressure increases.  -PREDIABETES: You have mild prediabetes. You were encouraged to lose weight and adhere to a Mediterranean diet to manage this condition.  -EAR IRRIGATION: We discussed the risk of tympanic membrane perforation (a hole or tear in your eardrum) and will obtain your consent for ear irrigation (a procedure to remove excess ear wax).  INSTRUCTIONS:  You will need to check your cholesterol levels in 3 months and continue regular blood pressure monitoring at home. Please continue to take your prescribed medications and follow the recommended lifestyle modifications.  NEXT STEPS: [x]  Early Intervention: Schedule sooner appointment, call our on-call services, or go to emergency room if there is any significant Increase in pain or discomfort New or worsening symptoms Sudden or severe changes in your health [x]  Flexible Follow-Up: We recommend a Return in about 2 weeks (around 03/31/2023) for 2 weeks to re-irrigate ears after debrox. 3 months for check effectivenes of zetia. for optimal routine care. This allows for progress monitoring and treatment adjustments. [x]  Preventive Care: Schedule your annual preventive care visit! It's typically covered by insurance and helps identify potential health issues early. [x]  Lab & X-ray Appointments: Incomplete tests scheduled today, or call to schedule. X-rays: [facility] (M-F, 8:30am-noon or 1pm-5pm). [x]  Medical Information Release: Sign a release form at front  desk to obtain relevant medical information we don't have.  MAKING THE MOST OF OUR  FOCUSED 20 MINUTE APPOINTMENTS: [x]   Clearly state your top concerns at the beginning of the visit to focus our discussion [x]   If you anticipate you will need more time, please inform the front desk during scheduling - we can book multiple appointments in the same week. [x]   If you have transportation problems- use our convenient video appointments or ask about transportation support. [x]   We can get down to business faster if you use MyChart to update information before the visit and submit non-urgent questions before your visit. Thank you for taking the time to provide details through MyChart.  Let our nurse know and she can import this information into your encounter documents.  Arrival and Wait Times: [x]   Arriving on time ensures that everyone receives prompt attention. [x]   Early morning (8a) and afternoon (1p) appointments tend to have shortest wait times. [x]   Unfortunately, we cannot delay appointments for late arrivals or hold slots during phone calls.  Getting Answers and Following Up [x]   Simple Questions & Concerns: For quick questions or basic follow-up after your visit, reach [place] at [phone] or MyChart messaging. [x]   Complex Concerns: If your concern is more complex, scheduling an appointment might be best. Discuss this with the staff to find the most suitable option. [x]   Lab & Imaging Results: We'll contact you directly if results are abnormal or you don't use MyChart. Most normal results will be on MyChart within 2-3 business days, with a review message from Dr. Jon Billings. Haven't heard back in 2 weeks? Need results sooner? Contact us at [phone]. [x]   Referrals: Our referral coordinator will manage specialist referrals. The specialist's office should contact you within 2 weeks to schedule an appointment. Call us if you haven't heard from them after 2 weeks.  Staying Connected [x]    MyChart: Activate your MyChart for the fastest way to access results and message [place]. See the last page of this paperwork for instructions on how to activate.  Bring to Your Next Appointment [x]   Medications: Please bring all your medication bottles to your next appointment to ensure we have an accurate record of your prescriptions. [x]   Health Diaries: If you're monitoring any health conditions at home, keeping a diary of your readings can be very helpful for discussions at your next appointment.  Billing [x]   X-ray & Lab Orders: These are billed by separate companies. Contact the invoicing company directly for questions or concerns. [x]   Visit Charges: Discuss any billing inquiries with our administrative services team.  Your Satisfaction Matters [x]   Share Your Experience: We strive for your satisfaction! If you have any complaints, or preferably compliments, please let Dr. Jon Billings know directly or contact our Practice Administrators, Edwena Felty or [employer], by asking at the front desk.   Reviewing Your Records [x]   Review this early draft of your clinical encounter notes below and the final encounter summary tomorrow on MyChart after its been completed.  All orders placed so far are visible here: Coronary artery disease involving native coronary artery of native heart without angina pectoris -     Multi-Vitamin/Minerals; Take 1 tablet by mouth daily.  Dispense: 90 tablet; Refill: 3 -     Ezetimibe; Take 1 tablet (10 mg total) by mouth daily.  Dispense: 90 tablet; Refill: 3  Smoking -     Nicotine; Place 1 patch (21 mg total) onto the skin daily.  Dispense: 28 patch; Refill: 11 -     Multi-Vitamin/Minerals; Take 1 tablet by mouth  daily.  Dispense: 90 tablet; Refill: 3  Screening for colon cancer -     Cologuard -     Multi-Vitamin/Minerals; Take 1 tablet by mouth daily.  Dispense: 90 tablet; Refill: 3  Nasal congestion -     Montelukast Sodium; Take 1 tablet (10 mg total) by  mouth at bedtime. TAKE 1 TABLET BY MOUTH EVERYDAY AT BEDTIME Strength: 10 mg  Dispense: 90 tablet; Refill: 3 -     Multi-Vitamin/Minerals; Take 1 tablet by mouth daily.  Dispense: 90 tablet; Refill: 3  Vitamin D deficiency -     Vitamin D-3; Take 5,000 Units by mouth daily.  Dispense: 90 tablet; Refill: 3 -     Multi-Vitamin/Minerals; Take 1 tablet by mouth daily.  Dispense: 90 tablet; Refill: 3  Resistant hypertension -     Losartan Potassium; Take 1 tablet (100 mg total) by mouth daily.  Dispense: 90 tablet; Refill: 3 -     Multi-Vitamin/Minerals; Take 1 tablet by mouth daily.  Dispense: 90 tablet; Refill: 3  Bilateral hearing loss due to cerumen impaction -     Debrox; Place 5 drops into both ears 2 (two) times daily.  Dispense: 15 mL; Refill: 0

## 2023-03-17 NOTE — Assessment & Plan Note (Signed)
Prediabetes: Mild prediabetes was noted. He was encouraged to lose weight and adhere to a Mediterranean diet.

## 2023-03-19 NOTE — Pre-Procedure Instructions (Signed)
Instructed patient on the following items: Arrival time 515 Nothing to eat or drink after midnight No meds AM of procedure Responsible person to drive you home and stay with you for 24 hrs  Have you missed any doses of anti-coagulant Xarelto- takes once a day, hasn't missed any doses.  Don't take on Monday morning.

## 2023-03-21 NOTE — Anesthesia Preprocedure Evaluation (Signed)
Anesthesia Evaluation  Patient identified by MRN, date of birth, ID band Patient awake    Reviewed: Allergy & Precautions, NPO status , Patient's Chart, lab work & pertinent test results, reviewed documented beta blocker date and time   History of Anesthesia Complications Negative for: history of anesthetic complications  Airway Mallampati: II  TM Distance: >3 FB Neck ROM: Full    Dental  (+) Dental Advisory Given, Edentulous Lower, Missing   Pulmonary COPD, Current SmokerPatient did not abstain from smoking.   Pulmonary exam normal        Cardiovascular hypertension, Pt. on home beta blockers and Pt. on medications + CAD and +CHF  + dysrhythmias Atrial Fibrillation  Rhythm:Irregular Rate:Normal   '23 TTE - EF 40 to 45%. Global hypokinesis. There is mild concentric left ventricular hypertrophy. RV is mildly enlarged. LA and RA were moderately dilated. Mild mitral valve regurgitation.      Neuro/Psych  PSYCHIATRIC DISORDERS Anxiety Depression     Mood d/o  Neuromuscular disease (Bells Palsy)    GI/Hepatic negative GI ROS, Neg liver ROS,,,  Endo/Other  Hypothyroidism   Pre-DM Obesity   Renal/GU negative Renal ROS     Musculoskeletal  (+) Arthritis ,    Abdominal   Peds  Hematology  On xarelto    Anesthesia Other Findings   Reproductive/Obstetrics                             Anesthesia Physical Anesthesia Plan  ASA: 3  Anesthesia Plan: General   Post-op Pain Management: Tylenol PO (pre-op)*   Induction: Intravenous  PONV Risk Score and Plan: 2 and Treatment may vary due to age or medical condition, Ondansetron, Dexamethasone and Midazolam  Airway Management Planned: Oral ETT  Additional Equipment: None  Intra-op Plan:   Post-operative Plan: Extubation in OR  Informed Consent: I have reviewed the patients History and Physical, chart, labs and discussed the procedure  including the risks, benefits and alternatives for the proposed anesthesia with the patient or authorized representative who has indicated his/her understanding and acceptance.     Dental advisory given  Plan Discussed with: CRNA and Anesthesiologist  Anesthesia Plan Comments:        Anesthesia Quick Evaluation

## 2023-03-22 ENCOUNTER — Encounter (HOSPITAL_COMMUNITY)
Admission: RE | Disposition: A | Discharge status: Home / Self Care | Payer: Self-pay | Source: Home / Self Care | Attending: Cardiovascular Disease

## 2023-03-22 ENCOUNTER — Other Ambulatory Visit: Payer: Self-pay

## 2023-03-22 ENCOUNTER — Other Ambulatory Visit (HOSPITAL_COMMUNITY): Payer: Self-pay

## 2023-03-22 ENCOUNTER — Ambulatory Visit (HOSPITAL_COMMUNITY): Payer: 59 | Admitting: Anesthesiology

## 2023-03-22 ENCOUNTER — Ambulatory Visit (HOSPITAL_COMMUNITY)
Admission: RE | Admit: 2023-03-22 | Discharge: 2023-03-22 | Disposition: A | Discharge status: Home / Self Care | Payer: 59 | Attending: Cardiovascular Disease | Admitting: Cardiovascular Disease

## 2023-03-22 ENCOUNTER — Ambulatory Visit (HOSPITAL_BASED_OUTPATIENT_CLINIC_OR_DEPARTMENT_OTHER): Payer: 59 | Admitting: Anesthesiology

## 2023-03-22 DIAGNOSIS — I480 Paroxysmal atrial fibrillation: Secondary | ICD-10-CM

## 2023-03-22 DIAGNOSIS — D6869 Other thrombophilia: Secondary | ICD-10-CM | POA: Insufficient documentation

## 2023-03-22 DIAGNOSIS — Z79899 Other long term (current) drug therapy: Secondary | ICD-10-CM | POA: Diagnosis not present

## 2023-03-22 DIAGNOSIS — Z7901 Long term (current) use of anticoagulants: Secondary | ICD-10-CM | POA: Insufficient documentation

## 2023-03-22 DIAGNOSIS — I5032 Chronic diastolic (congestive) heart failure: Secondary | ICD-10-CM | POA: Diagnosis not present

## 2023-03-22 DIAGNOSIS — F17210 Nicotine dependence, cigarettes, uncomplicated: Secondary | ICD-10-CM | POA: Diagnosis not present

## 2023-03-22 DIAGNOSIS — I110 Hypertensive heart disease with heart failure: Secondary | ICD-10-CM

## 2023-03-22 DIAGNOSIS — I509 Heart failure, unspecified: Secondary | ICD-10-CM

## 2023-03-22 DIAGNOSIS — I4819 Other persistent atrial fibrillation: Secondary | ICD-10-CM | POA: Diagnosis present

## 2023-03-22 HISTORY — PX: ATRIAL FIBRILLATION ABLATION: EP1191

## 2023-03-22 LAB — POCT ACTIVATED CLOTTING TIME
Activated Clotting Time: 293 seconds
Activated Clotting Time: 330 seconds

## 2023-03-22 SURGERY — ATRIAL FIBRILLATION ABLATION
Anesthesia: General

## 2023-03-22 MED ORDER — ONDANSETRON HCL 4 MG/2ML IJ SOLN
INTRAMUSCULAR | Status: DC | PRN
  Administered 2023-03-22: 4 mg via INTRAVENOUS

## 2023-03-22 MED ORDER — FENTANYL CITRATE (PF) 250 MCG/5ML IJ SOLN
INTRAMUSCULAR | Status: DC | PRN
  Administered 2023-03-22: 50 ug via INTRAVENOUS

## 2023-03-22 MED ORDER — LIDOCAINE 2% (20 MG/ML) 5 ML SYRINGE
INTRAMUSCULAR | Status: DC | PRN
  Administered 2023-03-22: 60 mg via INTRAVENOUS

## 2023-03-22 MED ORDER — SODIUM CHLORIDE 0.9 % IV SOLN: 250.0000 mL | INTRAVENOUS | Status: DC | PRN

## 2023-03-22 MED ORDER — COLCHICINE 0.6 MG PO TABS
0.6000 mg | ORAL_TABLET | Freq: Two times a day (BID) | ORAL | 0 refills | Status: DC
  Filled 2023-03-22: qty 10, 5d supply, fill #0

## 2023-03-22 MED ORDER — ACETAMINOPHEN 500 MG PO TABS
1000.0000 mg | ORAL_TABLET | Freq: Once | ORAL | Status: AC
  Administered 2023-03-22: 1000 mg via ORAL
  Filled 2023-03-22: qty 2

## 2023-03-22 MED ORDER — DEXAMETHASONE SODIUM PHOSPHATE 10 MG/ML IJ SOLN
INTRAMUSCULAR | Status: DC | PRN
  Administered 2023-03-22: 5 mg via INTRAVENOUS

## 2023-03-22 MED ORDER — SODIUM CHLORIDE 0.9 % IV SOLN: INTRAVENOUS | Status: DC

## 2023-03-22 MED ORDER — PROTAMINE SULFATE 10 MG/ML IV SOLN
INTRAVENOUS | Status: DC | PRN
  Administered 2023-03-22 (×5): 10 mg via INTRAVENOUS

## 2023-03-22 MED ORDER — HEPARIN (PORCINE) IN NACL 1000-0.9 UT/500ML-% IV SOLN
INTRAVENOUS | Status: DC | PRN
  Administered 2023-03-22 (×4): 500 mL

## 2023-03-22 MED ORDER — ROCURONIUM BROMIDE 10 MG/ML (PF) SYRINGE
PREFILLED_SYRINGE | INTRAVENOUS | Status: DC | PRN
  Administered 2023-03-22: 10 mg via INTRAVENOUS
  Administered 2023-03-22: 50 mg via INTRAVENOUS

## 2023-03-22 MED ORDER — AMIODARONE HCL 200 MG PO TABS: 200.0000 mg | ORAL_TABLET | Freq: Every day | ORAL | Status: DC

## 2023-03-22 MED ORDER — SUGAMMADEX SODIUM 200 MG/2ML IV SOLN
INTRAVENOUS | Status: DC | PRN
  Administered 2023-03-22: 200 mg via INTRAVENOUS

## 2023-03-22 MED ORDER — SODIUM CHLORIDE 0.9% FLUSH: 3.0000 mL | INTRAVENOUS | Status: DC | PRN

## 2023-03-22 MED ORDER — ONDANSETRON HCL 4 MG/2ML IJ SOLN: 4.0000 mg | Freq: Four times a day (QID) | INTRAMUSCULAR | Status: DC | PRN

## 2023-03-22 MED ORDER — EPHEDRINE SULFATE (PRESSORS) 50 MG/ML IJ SOLN
INTRAMUSCULAR | Status: DC | PRN
  Administered 2023-03-22 (×3): 5 mg via INTRAVENOUS

## 2023-03-22 MED ORDER — SODIUM CHLORIDE 0.9% FLUSH: 3.0000 mL | Freq: Two times a day (BID) | INTRAVENOUS | Status: DC

## 2023-03-22 MED ORDER — PHENYLEPHRINE HCL-NACL 20-0.9 MG/250ML-% IV SOLN
INTRAVENOUS | Status: DC | PRN
  Administered 2023-03-22: 25 ug/min via INTRAVENOUS

## 2023-03-22 MED ORDER — HEPARIN SODIUM (PORCINE) 1000 UNIT/ML IJ SOLN
INTRAMUSCULAR | Status: DC | PRN
  Administered 2023-03-22: 18000 [IU] via INTRAVENOUS
  Administered 2023-03-22: 3000 [IU] via INTRAVENOUS

## 2023-03-22 MED ORDER — DOBUTAMINE INFUSION FOR EP/ECHO/NUC (1000 MCG/ML)
INTRAVENOUS | Status: DC | PRN
  Administered 2023-03-22: 20 ug/kg/min via INTRAVENOUS

## 2023-03-22 MED ORDER — PROPOFOL 10 MG/ML IV BOLUS
INTRAVENOUS | Status: DC | PRN
  Administered 2023-03-22: 120 mg via INTRAVENOUS

## 2023-03-22 MED ORDER — PANTOPRAZOLE SODIUM 40 MG PO TBEC
40.0000 mg | DELAYED_RELEASE_TABLET | Freq: Every day | ORAL | 0 refills | Status: DC
  Filled 2023-03-22: qty 30, 30d supply, fill #0
  Filled 2023-08-14: qty 15, 15d supply, fill #1

## 2023-03-22 MED ORDER — HEPARIN SODIUM (PORCINE) 1000 UNIT/ML IJ SOLN
INTRAMUSCULAR | Status: DC | PRN
  Administered 2023-03-22: 1000 [IU] via INTRAVENOUS

## 2023-03-22 MED ORDER — ACETAMINOPHEN 325 MG PO TABS: 650.0000 mg | ORAL_TABLET | ORAL | Status: DC | PRN

## 2023-03-22 MED ORDER — PHENYLEPHRINE HCL (PRESSORS) 10 MG/ML IV SOLN
INTRAVENOUS | Status: DC | PRN
  Administered 2023-03-22: 160 ug via INTRAVENOUS
  Administered 2023-03-22: 80 ug via INTRAVENOUS
  Administered 2023-03-22: 160 ug via INTRAVENOUS
  Administered 2023-03-22 (×3): 80 ug via INTRAVENOUS

## 2023-03-22 SURGICAL SUPPLY — 19 items
CATH 8FR REPROCESSED SOUNDSTAR (CATHETERS) ×1 IMPLANT
CATH 8FR SOUNDSTAR REPROCESSED (CATHETERS) IMPLANT
CATH ABLAT QDOT MICRO BI TC FJ (CATHETERS) IMPLANT
CATH OCTARAY 2.0 F 3-3-3-3-3 (CATHETERS) IMPLANT
CATH PIGTAIL STEERABLE D1 8.7 (WIRE) IMPLANT
CATH S-M CIRCA TEMP PROBE (CATHETERS) IMPLANT
CATH WEBSTER BI DIR CS D-F CRV (CATHETERS) IMPLANT
CLOSURE PERCLOSE PROSTYLE (VASCULAR PRODUCTS) IMPLANT
COVER SWIFTLINK CONNECTOR (BAG) ×2 IMPLANT
DEVICE CLOSURE MYNXGRIP 6/7F (Vascular Products) IMPLANT
PACK EP LATEX FREE (CUSTOM PROCEDURE TRAY) ×1
PACK EP LF (CUSTOM PROCEDURE TRAY) ×2 IMPLANT
PAD DEFIB RADIO PHYSIO CONN (PAD) ×2 IMPLANT
PATCH CARTO3 (PAD) IMPLANT
SHEATH CARTO VIZIGO MED CURVE (SHEATH) IMPLANT
SHEATH PINNACLE 8F 10CM (SHEATH) IMPLANT
SHEATH PINNACLE 9F 10CM (SHEATH) IMPLANT
SHEATH PROBE COVER 6X72 (BAG) IMPLANT
TUBING SMART ABLATE COOLFLOW (TUBING) IMPLANT

## 2023-03-22 NOTE — Anesthesia Postprocedure Evaluation (Signed)
Anesthesia Post Note  Patient: Jesse Figgers Starliper [name]  Procedure(s) Performed: ATRIAL FIBRILLATION ABLATION     Patient location during evaluation: PACU Anesthesia Type: General Level of consciousness: awake and alert Pain management: pain level controlled Vital Signs Assessment: post-procedure vital signs reviewed and stable Respiratory status: spontaneous breathing, nonlabored ventilation, respiratory function stable and patient connected to nasal cannula oxygen Cardiovascular status: blood pressure returned to baseline and stable Postop Assessment: no apparent nausea or vomiting Anesthetic complications: no   There were no known notable events for this encounter.  Last Vitals:  Vitals:   03/22/23 1045 03/22/23 1100  BP: 111/78 (!) 110/92  Pulse: 67 61  Resp: 20 19  Temp:    SpO2: 90% (!) 89%    Last Pain:  Vitals:   03/22/23 0955  TempSrc: Temporal  PainSc: 0-No pain                 Beryle Lathe

## 2023-03-22 NOTE — Anesthesia Procedure Notes (Signed)
Procedure Name: Intubation Date/Time: 03/22/2023 7:39 AM  Performed by: Margarita Rana, CRNAPre-anesthesia Checklist: Patient identified, Patient being monitored, Timeout performed, Emergency Drugs available and Suction available Patient Re-evaluated:Patient Re-evaluated prior to induction Oxygen Delivery Method: Circle System Utilized Preoxygenation: Pre-oxygenation with 100% oxygen Induction Type: IV induction Ventilation: Mask ventilation without difficulty and Oral airway inserted - appropriate to patient size Laryngoscope Size: Mac and 4 Grade View: Grade II Tube type: Oral Tube size: 7.5 mm Number of attempts: 1 Airway Equipment and Method: Stylet Placement Confirmation: ETT inserted through vocal cords under direct vision, positive ETCO2 and breath sounds checked- equal and bilateral Secured at: 22 cm Tube secured with: Tape Dental Injury: Teeth and Oropharynx as per pre-operative assessment

## 2023-03-22 NOTE — H&P (Signed)
Electrophysiology Office Note:    Date:  03/22/2023   ID:  Jesse Simmer [name], DOB 06-11-1961, MRN [id number]  PCP:  Lula Olszewski, MD   [facility] Providers Cardiologist:  None Electrophysiologist:  Lewayne Bunting, MD     Referring MD: No ref. provider found   History of Present Illness:    Jesse QUEBODEAUX [name] is a 62 y.o. male with a hx listed below, significant for atrial fibrillation, referred fby dr. Ladona Ridgel to discuss ablation.  He was diagnosed with AF years ago in the setting of thyrotoxicosis. Despite having this thyroid disease under control, he had recurrence of AF and was placed on amiodarone. He had a DCCV but had early recurrence of AF.   He sometimes feels his AF -- palpitations, fatigue. At present he feels reasonably well.  I reviewed the patient's CT and labs. There was no LAA thrombus. he  has not missed any doses of anticoagulation, and he took his dose last night. There have been no changes in the patient's diagnoses or condition since our recent clinic visit. He notes that spironolactone was added to his medications.  Past Medical History:  Diagnosis Date   Acute hypoxemic respiratory failure (HCC) 09/18/2022   Allergy    Anxiety    Atrial fibrillation (HCC)    Atrial fibrillation with RVR (HCC) 09/12/2022   Bell palsy    Depression    Diastolic heart failure    HAP (hospital-acquired pneumonia) 09/17/2022   Hypercholesterolemia    Hypertension    Pneumonia    Pneumonia of right lower lobe due to infectious organism 09/17/2022   Thyroid disease     Past Surgical History:  Procedure Laterality Date   CARDIOVERSION N/A 09/15/2022   Procedure: CARDIOVERSION;  Surgeon: Christell Constant, MD;  Location: [facility];  Service: Cardiovascular;  Laterality: N/A;   CARDIOVERSION N/A 10/22/2022   Procedure: CARDIOVERSION;  Surgeon: Chrystie Nose, MD;  Location: [facility]OS[facility];  Service: Cardiovascular;  Laterality: N/A;   CHOLECYSTECTOMY      HERNIA REPAIR     KNEE SURGERY     VASECTOMY      Current Medications: Current Meds  Medication Sig   amiodarone (PACERONE) 200 MG tablet Take 1 tablet (200 mg total) by mouth daily. (Patient taking differently: Take 200-400 mg by mouth See admin instructions. 400 mg twice a day for five day.  200 mg one a day for 25 days)   Cholecalciferol (VITAMIN D-3) 125 MCG (5000 UT) TABS Take 5,000 Units by mouth daily.   cloNIDine (CATAPRES - DOSED IN MG/24 HR) 0.1 mg/24hr patch Place 1 patch (0.1 mg total) onto the skin once a week.   ezetimibe (ZETIA) 10 MG tablet Take 1 tablet (10 mg total) by mouth daily.   isosorbide mononitrate (IMDUR) 30 MG 24 hr tablet Take 1 tablet (30 mg total) by mouth daily. Cannot take levitra with.   levothyroxine (SYNTHROID) 200 MCG tablet Take 1 tablet (200 mcg total) by mouth daily. Take as recommended by endocrine.  200 mg daily except for Sunday, take 100 mg. (Patient taking differently: Take 100-200 mcg by mouth See admin instructions. Take as recommended by endocrine.  200 mg daily except for Sunday, take 100 mg.)   losartan (COZAAR) 100 MG tablet Take 1 tablet (100 mg total) by mouth daily.   metoprolol tartrate (LOPRESSOR) 50 MG tablet Take 1 tablet (50 mg total) by mouth 2 (two) times daily.   montelukast (SINGULAIR) 10 MG tablet Take  1 tablet (10 mg total) by mouth at bedtime. TAKE 1 TABLET BY MOUTH EVERYDAY AT BEDTIME Strength: 10 mg   Multiple Vitamins-Minerals (MULTIVITAMIN WITH MINERALS) tablet Take 1 tablet by mouth daily.   rivaroxaban (XARELTO) 20 MG TABS tablet Take 1 tablet (20 mg total) by mouth daily with supper.   rosuvastatin (CRESTOR) 40 MG tablet Take 1 tablet (40 mg total) by mouth daily. Replaces 20 mg dose   spironolactone (ALDACTONE) 25 MG tablet Take 1 tablet (25 mg total) by mouth daily.   tamsulosin (FLOMAX) 0.4 MG CAPS capsule Take 1 capsule (0.4 mg total) by mouth daily.     Allergies:   Sulfa drugs cross reactors, Codeine, and  Hydrochlorothiazide   Social and Family History: Reviewed in Epic  ROS:   Please see the history of present illness.    All other systems reviewed and are negative.  EKGs/Labs/Other Studies Reviewed Today:    Cardiac Studies & Procedures       ECHOCARDIOGRAM  ECHOCARDIOGRAM COMPLETE 09/12/2022  Narrative ECHOCARDIOGRAM REPORT    Patient Name:   Jesse ROSBERG [name] Date of Exam: 09/12/2022 Medical Rec #:  [id number]            Height:       69.0 in Accession #:    [id number]           Weight:       193.0 lb Date of Birth:  Dec 08, 1960            BSA:          2.035 m Patient Age:    61 years             BP:           119/92 mmHg Patient Gender: M                    HR:           130 bpm. Exam Location:  Inpatient  Procedure: 2D Echo, Cardiac Doppler and Color Doppler  Indications:    Atrial Fibrillation  History:        Patient has no prior history of Echocardiogram examinations. Diastolic HF, Arrythmias:Atrial Fibrillation; Risk Factors:Hypertension. Graves disease.  Sonographer:    Milda Smart Referring Phys: Laurance Flatten, E   Sonographer Comments: Image acquisition challenging due to patient body habitus. IMPRESSIONS   1. Left ventricular ejection fraction, by estimation, is 40 to 45%. The left ventricle has mildly decreased function. The left ventricle demonstrates global hypokinesis. There is mild concentric left ventricular hypertrophy. Left ventricular diastolic function could not be evaluated. 2. Right ventricular systolic function is normal. The right ventricular size is mildly enlarged. There is normal pulmonary artery systolic pressure. The estimated right ventricular systolic pressure is 21.3 mmHg. 3. Left atrial size was moderately dilated. 4. Right atrial size was moderately dilated. 5. The mitral valve is normal in structure. Mild mitral valve regurgitation. 6. The aortic valve is tricuspid. Aortic valve regurgitation is not visualized. No  aortic stenosis is present. 7. The inferior vena cava is normal in size with greater than 50% respiratory variability, suggesting right atrial pressure of 3 mmHg.  FINDINGS Left Ventricle: Left ventricular ejection fraction, by estimation, is 40 to 45%. The left ventricle has mildly decreased function. The left ventricle demonstrates global hypokinesis. The left ventricular internal cavity size was normal in size. There is mild concentric left ventricular hypertrophy. Left ventricular diastolic function could not be evaluated due to atrial  fibrillation. Left ventricular diastolic function could not be evaluated.  Right Ventricle: The right ventricular size is mildly enlarged. No increase in right ventricular wall thickness. Right ventricular systolic function is normal. There is normal pulmonary artery systolic pressure. The tricuspid regurgitant velocity is 2.14 m/s, and with an assumed right atrial pressure of 3 mmHg, the estimated right ventricular systolic pressure is 21.3 mmHg.  Left Atrium: Left atrial size was moderately dilated.  Right Atrium: Right atrial size was moderately dilated.  Pericardium: There is no evidence of pericardial effusion.  Mitral Valve: The mitral valve is normal in structure. Mild mitral valve regurgitation.  Tricuspid Valve: The tricuspid valve is normal in structure. Tricuspid valve regurgitation is trivial.  Aortic Valve: The aortic valve is tricuspid. Aortic valve regurgitation is not visualized. No aortic stenosis is present.  Pulmonic Valve: The pulmonic valve was normal in structure. Pulmonic valve regurgitation is not visualized.  Aorta: The aortic root and ascending aorta are structurally normal, with no evidence of dilitation.  Venous: The inferior vena cava is normal in size with greater than 50% respiratory variability, suggesting right atrial pressure of 3 mmHg.  IAS/Shunts: The interatrial septum was not well visualized.   LEFT  VENTRICLE PLAX 2D LVIDd:         4.50 cm LVIDs:         3.58 cm LV PW:         1.30 cm LV IVS:        1.30 cm   LEFT ATRIUM         Index LA diam:    4.36 cm 2.14 cm/m TRICUSPID VALVE TR Peak grad:   18.3 mmHg TR Vmax:        214.00 cm/s  Rachelle Hora Croitoru MD Electronically signed by Thurmon Fair MD Signature Date/Time: 09/12/2022/3:02:12 PM    Final             EKG:  Last EKG results: today - atrial fibrillation, V-rate 90 bpm   Recent Labs: 09/20/2022: B Natriuretic Peptide 411.4 09/23/2022: Magnesium 2.0 10/19/2022: ALT 32 03/01/2023: TSH 8.05 03/08/2023: BUN 12; Creatinine, Ser 1.23; Hemoglobin 16.9; Platelets 209; Potassium 4.0; Sodium 141     Physical Exam:    VS:  BP 131/83   Pulse 77   Temp 98.8 F (37.1 C) (Temporal)   Resp 20   Ht 5\' 9"  (1.753 m)   Wt 94.3 kg   SpO2 95%   BMI 30.72 kg/m     Wt Readings from Last 3 Encounters:  03/22/23 94.3 kg  03/17/23 92.8 kg  03/04/23 93 kg     GEN: Well nourished, well developed in no acute distress CARDIAC: iRRR, no murmurs, rubs, gallops RESPIRATORY:  Normal work of breathing MUSCULOSKELETAL: no edema    ASSESSMENT & PLAN:    Persistent AF: failed amiodarone, has mild symptoms. We discussed the indication, rationale, logistics, anticipated benefits, and potential risks of the ablation procedure including but not limited to -- bleed at the groin access site, chest pain, damage to nearby organs such as the diaphragm, lungs, or esophagus, need for a drainage tube, or prolonged hospitalization. I explained that the risk for stroke, heart attack, need for open chest surgery, or even death is very low but not zero. he  expressed understanding and wishes to proceed.  CHFmrEF: on metoprolol, losartan. Rhythm control should help. Spironolactone added since our last visit Hypertension Secondary hypercoagulable state: continue xarelto 20        Medication Adjustments/Labs and Tests  Ordered: Current medicines  are reviewed at length with the patient today.  Concerns regarding medicines are outlined above.  Orders Placed This Encounter  Procedures   Informed Consent Details: Physician/Practitioner Attestation; Transcribe to consent form and obtain patient signature   Initiate Pre-op Protocol   Void on call to EP Lab   Confirm CBC and BMP (or CMP) results within 7 days for inpatient and 30 days for outpatient:   Clip right and left femoral area PM before surgery   Clip right internal jugular area PM before surgery   Pre-admission testing diagnosis   EP STUDY   Insert peripheral IV   Meds ordered this encounter  Medications   0.9 %  sodium chloride infusion   acetaminophen (TYLENOL) tablet 1,000 mg     Signed, Maurice Small, MD  03/22/2023 7:02 AM    [facility]

## 2023-03-22 NOTE — Transfer of Care (Signed)
Immediate Anesthesia Transfer of Care Note  Patient: Jesse Haring Peeters [name]  Procedure(s) Performed: ATRIAL FIBRILLATION ABLATION  Patient Location: Cath Lab  Anesthesia Type:General  Level of Consciousness: awake, patient cooperative, and responds to stimulation  Airway & Oxygen Therapy: Patient Spontanous Breathing and Patient connected to nasal cannula oxygen  Post-op Assessment: Report given to RN and Post -op Vital signs reviewed and stable  Post vital signs: Reviewed and stable  Last Vitals:  Vitals Value Taken Time  BP 115/91 03/22/23 0945  Temp    Pulse 70 03/22/23 0947  Resp 23 03/22/23 0947  SpO2 95 % 03/22/23 0947  Vitals shown include unvalidated device data.  Last Pain:  Vitals:   03/22/23 0629  TempSrc:   PainSc: 0-No pain         Complications: There were no known notable events for this encounter.

## 2023-03-22 NOTE — Progress Notes (Signed)
O2 started at 2l/min due to sats 88 on room air

## 2023-03-23 ENCOUNTER — Encounter (HOSPITAL_COMMUNITY): Payer: Self-pay | Admitting: Cardiovascular Disease

## 2023-03-29 ENCOUNTER — Encounter: Payer: Self-pay | Admitting: Family Medicine

## 2023-03-29 ENCOUNTER — Emergency Department (HOSPITAL_BASED_OUTPATIENT_CLINIC_OR_DEPARTMENT_OTHER): Payer: 59

## 2023-03-29 ENCOUNTER — Other Ambulatory Visit: Payer: Self-pay

## 2023-03-29 ENCOUNTER — Ambulatory Visit: Payer: 59 | Admitting: Family Medicine

## 2023-03-29 ENCOUNTER — Encounter (HOSPITAL_BASED_OUTPATIENT_CLINIC_OR_DEPARTMENT_OTHER): Payer: Self-pay

## 2023-03-29 ENCOUNTER — Telehealth: Payer: Self-pay | Admitting: Internal Medicine

## 2023-03-29 ENCOUNTER — Emergency Department (HOSPITAL_BASED_OUTPATIENT_CLINIC_OR_DEPARTMENT_OTHER)
Admission: EM | Admit: 2023-03-29 | Discharge: 2023-03-29 | Disposition: A | Discharge status: Home / Self Care | Payer: 59 | Attending: Emergency Medicine | Admitting: Emergency Medicine

## 2023-03-29 VITALS — BP 132/70 | HR 53 | Temp 97.9°F | Ht 69.0 in | Wt 201.2 lb

## 2023-03-29 DIAGNOSIS — R197 Diarrhea, unspecified: Secondary | ICD-10-CM | POA: Diagnosis present

## 2023-03-29 DIAGNOSIS — R6889 Other general symptoms and signs: Secondary | ICD-10-CM

## 2023-03-29 DIAGNOSIS — D72829 Elevated white blood cell count, unspecified: Secondary | ICD-10-CM | POA: Insufficient documentation

## 2023-03-29 DIAGNOSIS — Z79899 Other long term (current) drug therapy: Secondary | ICD-10-CM | POA: Insufficient documentation

## 2023-03-29 DIAGNOSIS — R140 Abdominal distension (gaseous): Secondary | ICD-10-CM

## 2023-03-29 DIAGNOSIS — R1110 Vomiting, unspecified: Secondary | ICD-10-CM

## 2023-03-29 DIAGNOSIS — Z7901 Long term (current) use of anticoagulants: Secondary | ICD-10-CM | POA: Diagnosis not present

## 2023-03-29 DIAGNOSIS — T65894A Toxic effect of other specified substances, undetermined, initial encounter: Secondary | ICD-10-CM | POA: Insufficient documentation

## 2023-03-29 DIAGNOSIS — R34 Anuria and oliguria: Secondary | ICD-10-CM

## 2023-03-29 DIAGNOSIS — E860 Dehydration: Secondary | ICD-10-CM | POA: Insufficient documentation

## 2023-03-29 DIAGNOSIS — I5030 Unspecified diastolic (congestive) heart failure: Secondary | ICD-10-CM | POA: Diagnosis not present

## 2023-03-29 DIAGNOSIS — K521 Toxic gastroenteritis and colitis: Secondary | ICD-10-CM | POA: Insufficient documentation

## 2023-03-29 DIAGNOSIS — I110 Hypertensive heart disease with heart failure: Secondary | ICD-10-CM | POA: Insufficient documentation

## 2023-03-29 LAB — CBC
HCT: 50.5 % (ref 39.0–52.0)
Hemoglobin: 16.6 g/dL (ref 13.0–17.0)
MCH: 29.9 pg (ref 26.0–34.0)
MCHC: 32.9 g/dL (ref 30.0–36.0)
MCV: 90.8 fL (ref 80.0–100.0)
Platelets: 235 10*3/uL (ref 150–400)
RBC: 5.56 MIL/uL (ref 4.22–5.81)
RDW: 15.2 % (ref 11.5–15.5)
WBC: 13.2 10*3/uL — ABNORMAL HIGH (ref 4.0–10.5)
nRBC: 0 % (ref 0.0–0.2)

## 2023-03-29 LAB — URINALYSIS, ROUTINE W REFLEX MICROSCOPIC
Bacteria, UA: NONE SEEN
Bilirubin Urine: NEGATIVE
Glucose, UA: NEGATIVE mg/dL
Ketones, ur: NEGATIVE mg/dL
Leukocytes,Ua: NEGATIVE
Nitrite: NEGATIVE
Protein, ur: 30 mg/dL — AB
Specific Gravity, Urine: 1.032 — ABNORMAL HIGH (ref 1.005–1.030)
pH: 6 (ref 5.0–8.0)

## 2023-03-29 LAB — COMPREHENSIVE METABOLIC PANEL
ALT: 25 U/L (ref 0–44)
AST: 18 U/L (ref 15–41)
Albumin: 4.3 g/dL (ref 3.5–5.0)
Alkaline Phosphatase: 85 U/L (ref 38–126)
Anion gap: 7 (ref 5–15)
BUN: 12 mg/dL (ref 8–23)
CO2: 27 mmol/L (ref 22–32)
Calcium: 9 mg/dL (ref 8.9–10.3)
Chloride: 103 mmol/L (ref 98–111)
Creatinine, Ser: 1.05 mg/dL (ref 0.61–1.24)
GFR, Estimated: >60 mL/min (ref >60)
Glucose, Bld: 90 mg/dL (ref 70–99)
Potassium: 3.7 mmol/L (ref 3.5–5.1)
Sodium: 137 mmol/L (ref 135–145)
Total Bilirubin: 0.7 mg/dL (ref 0.3–1.2)
Total Protein: 7.1 g/dL (ref 6.5–8.1)

## 2023-03-29 LAB — LIPASE, BLOOD: Lipase: 36 U/L (ref 11–51)

## 2023-03-29 MED ORDER — IOHEXOL 300 MG/ML  SOLN
100.0000 mL | Freq: Once | INTRAMUSCULAR | Status: AC | PRN
  Administered 2023-03-29: 85 mL via INTRAVENOUS

## 2023-03-29 MED ORDER — LACTATED RINGERS IV BOLUS
1000.0000 mL | Freq: Once | INTRAVENOUS | Status: AC
  Administered 2023-03-29: 1000 mL via INTRAVENOUS

## 2023-03-29 NOTE — ED Notes (Signed)
 Reviewed AVS/discharge instruction with patient. Time allotted for and all questions answered. Patient is agreeable for d/c and escorted to ed exit by staff.  

## 2023-03-29 NOTE — Progress Notes (Signed)
Subjective:  Patient ID: Jesse Simmer [name], male    DOB: 06-04-1961  Age: 62 y.o. MRN: [id number]  CC:  Chief Complaint  Patient presents with   Diarrhea    Pt had an cardiac ablation done Monday, notes Tuesday started having diarrhea since then, notes 6 bouts daily if not more, feels fatigued and cramping in the stomach, some vomiting /dry heaving, notes has not been drinking extra water since this started     HPI [name] presents for   Diarrhea: Noted since last week, cardiac ablation  for afib, 1 week ago.   Had 2 roast beef sandwiches from [employer] that night along with hot dog that night. Diarrhea started following morning. No initial vomiting, fever or abdominal pain. Symptoms have worsened since 4 days ago - more abdominal pain, feels hot inside and stomach swelling/distension. Dry heaves started 4 days ago, but no vomitus. Hot and cold spells with shaking chills past 5 days. No measured temp at home. Headache past 5 days. Diarrhea 7-8 times per day, nonbloody.  No relief with peptobismol.  Trying to drink fluids, but not wanting to drink.   Last uop this morning. Yellow. Single UOP today. 4-5 episodes yesterday. Small amounts.   Has been treated with colchicine 0.6mg  BID since leaving hospital - last dose yesterday.        History Patient Active Problem List   Diagnosis Date Noted   CAD (coronary artery disease) 03/17/2023   Prediabetes 03/17/2023   Hypokalemia 03/07/2023   Healthcare maintenance 02/26/2023   Acute idiopathic gout of multiple sites 02/17/2023   Lumbar degenerative disc disease 10/08/2022   Bladder dysfunction 10/08/2022   Seasonal allergies 10/08/2022   Major depressive disorder, single episode, in remission (HCC) 10/08/2022   Prolonged QT interval 09/20/2022   Tobacco use disorder 09/19/2022   Mood disorder (HCC) 09/19/2022   Iron deficiency anemia 09/18/2022   Vitamin D deficiency 03/31/2022   COPD GOLD 0/ emphysema on CT   03/15/2019   PVC's (premature ventricular contractions) 01/06/2017   Bone neoplasm 02/25/2015   Hyperlipidemia 02/25/2015   Cigarette smoker 08/07/2013   Depression with anxiety 02/20/2012   ED (erectile dysfunction) 02/20/2012   Hx of Bell's palsy 02/20/2012   Amputated toe (HCC) 02/20/2012   Paroxysmal atrial fibrillation with RVR (HCC) 01/15/2011   Resistant hypertension 01/15/2011   Dyslipidemia 01/15/2011   Hypothyroidism 01/15/2011   Past Medical History:  Diagnosis Date   Acute hypoxemic respiratory failure (HCC) 09/18/2022   Allergy    Anxiety    Atrial fibrillation (HCC)    Atrial fibrillation with RVR (HCC) 09/12/2022   Bell palsy    Depression    Diastolic heart failure    HAP (hospital-acquired pneumonia) 09/17/2022   Hypercholesterolemia    Hypertension    Pneumonia    Pneumonia of right lower lobe due to infectious organism 09/17/2022   Thyroid disease    Past Surgical History:  Procedure Laterality Date   ATRIAL FIBRILLATION ABLATION N/A 03/22/2023   Procedure: ATRIAL FIBRILLATION ABLATION;  Surgeon: Maurice Small, MD;  Location: [facility];  Service: Cardiovascular;  Laterality: N/A;   CARDIOVERSION N/A 09/15/2022   Procedure: CARDIOVERSION;  Surgeon: Christell Constant, MD;  Location: [facility];  Service: Cardiovascular;  Laterality: N/A;   CARDIOVERSION N/A 10/22/2022   Procedure: CARDIOVERSION;  Surgeon: Chrystie Nose, MD;  Location: [facility] ENDOSCOPY;  Service: Cardiovascular;  Laterality: N/A;   CHOLECYSTECTOMY     HERNIA REPAIR  KNEE SURGERY     VASECTOMY     Allergies  Allergen Reactions   Sulfa Drugs Cross Reactors Hives   Codeine Other (See Comments)    Made him very jittery    Hydrochlorothiazide Other (See Comments)    Hyponatremia   Prior to Admission medications   Medication Sig Start Date End Date Taking? Authorizing Provider  amiodarone (PACERONE) 200 MG tablet Take 1 tablet (200 mg total) by mouth daily. 03/22/23  04/21/23 Yes Tillery, Mariam Dollar, PA-C  carbamide peroxide (DEBROX) 6.5 % OTIC solution Place 5 drops into both ears 2 (two) times daily. 03/17/23  Yes Lula Olszewski, MD  Cholecalciferol (VITAMIN D-3) 125 MCG (5000 UT) TABS Take 5,000 Units by mouth daily. 03/17/23  Yes Lula Olszewski, MD  cloNIDine (CATAPRES - DOSED IN MG/24 HR) 0.1 mg/24hr patch Place 1 patch (0.1 mg total) onto the skin once a week. 10/08/22  Yes Lula Olszewski, MD  ezetimibe (ZETIA) 10 MG tablet Take 1 tablet (10 mg total) by mouth daily. 03/17/23  Yes Lula Olszewski, MD  fluticasone [facility]) 50 MCG/ACT nasal spray Place 2 sprays into both nostrils daily. 02/17/23  Yes Lula Olszewski, MD  isosorbide mononitrate (IMDUR) 30 MG 24 hr tablet Take 1 tablet (30 mg total) by mouth daily. Cannot take levitra with. 02/17/23  Yes Lula Olszewski, MD  levothyroxine (SYNTHROID) 200 MCG tablet Take 1 tablet (200 mcg total) by mouth daily. Take as recommended by endocrine.  200 mg daily except for Sunday, take 100 mg. Patient taking differently: Take 100-200 mcg by mouth See admin instructions. Take as recommended by endocrine.  200 mg daily except for Sunday, take 100 mg. 09/15/22  Yes Zigmund Daniel., MD  loratadine (CLARITIN) 10 MG tablet Take 1 tablet (10 mg total) by mouth daily. 02/17/23  Yes Lula Olszewski, MD  losartan (COZAAR) 100 MG tablet Take 1 tablet (100 mg total) by mouth daily. 03/17/23  Yes Lula Olszewski, MD  metoprolol tartrate (LOPRESSOR) 50 MG tablet Take 1 tablet (50 mg total) by mouth 2 (two) times daily. 02/25/23 05/26/23 Yes Lula Olszewski, MD  montelukast (SINGULAIR) 10 MG tablet Take 1 tablet (10 mg total) by mouth at bedtime. TAKE 1 TABLET BY MOUTH EVERYDAY AT BEDTIME Strength: 10 mg 03/17/23  Yes Lula Olszewski, MD  Multiple Vitamins-Minerals (MULTIVITAMIN WITH MINERALS) tablet Take 1 tablet by mouth daily. 03/17/23  Yes Lula Olszewski, MD  nicotine (NICODERM CQ - DOSED IN MG/24 HOURS) 21 mg/24hr patch Place  1 patch (21 mg total) onto the skin daily. 03/17/23  Yes Lula Olszewski, MD  pantoprazole (PROTONIX) 40 MG tablet Take 1 tablet (40 mg total) by mouth daily. 03/22/23 05/06/23 Yes TilleryMariam Dollar, PA-C  rivaroxaban (XARELTO) 20 MG TABS tablet Take 1 tablet (20 mg total) by mouth daily with supper. 10/08/22  Yes Lula Olszewski, MD  rosuvastatin (CRESTOR) 40 MG tablet Take 1 tablet (40 mg total) by mouth daily. Replaces 20 mg dose 10/08/22 10/03/23 Yes Lula Olszewski, MD  Saline (SIMPLY SALINE) 0.9 % AERS Place 2 each into the nose as directed. Use nightly for sinus hygiene long-term.  Can also be used as many times daily as desired to assist with clearing congested sinuses. 02/17/23  Yes Lula Olszewski, MD  spironolactone (ALDACTONE) 25 MG tablet Take 1 tablet (25 mg total) by mouth daily. 02/25/23  Yes Lula Olszewski, MD  tamsulosin (FLOMAX) 0.4 MG CAPS capsule  Take 1 capsule (0.4 mg total) by mouth daily. 10/08/22  Yes Lula Olszewski, MD  colchicine 0.6 MG tablet Take 1 tablet (0.6 mg total) by mouth 2 (two) times daily for 5 days. 03/22/23 03/27/23  Graciella Freer, PA-C   Social History   Socioeconomic History   Marital status: Married    Spouse name: Not on file   Number of children: 1   Years of education: Not on file   Highest education level: Not on file  Occupational History   Not on file  Tobacco Use   Smoking status: Every Day    Current packs/day: 0.50    Average packs/day: 0.5 packs/day for 44.5 years (22.3 ttl pk-yrs)    Types: Cigarettes    Start date: 09/14/1978   Smokeless tobacco: Never  Vaping Use   Vaping status: Never Used  Substance and Sexual Activity   Alcohol use: Yes    Alcohol/week: 0.0 standard drinks of alcohol    Comment: rarely   Drug use: No   Sexual activity: Never    Birth control/protection: None  Other Topics Concern   Not on file  Social History Narrative   Marital status: married x 30+ years      Children:  2 children;(38,37); 3+3  grandchild      Lives:  With wife, son      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      Tobacco: 1 ppd x 30 years      Alcohol: rare     Regular exercise: walking daily   Caffeine use: daily      Social Determinants of Health   Financial Resource Strain: Not on file  Food Insecurity: No Food Insecurity (09/13/2022)   Hunger Vital Sign    Worried About Running Out of Food in the Last Year: Never true    Ran Out of Food in the Last Year: Never true  Transportation Needs: No Transportation Needs (09/13/2022)   PRAPARE - Administrator, Civil Service (Medical): No    Lack of Transportation (Non-Medical): No  Physical Activity: Not on file  Stress: Not on file  Social Connections: Unknown (01/27/2022)   Received from [facility]   Social Network    Social Network: Not on file  Intimate Partner Violence: Not At Risk (09/13/2022)   Humiliation, Afraid, Rape, and Kick questionnaire    Fear of Current or Ex-Partner: No    Emotionally Abused: No    Physically Abused: No    Sexually Abused: No    Review of Systems Per HPI.   Objective:   Vitals:   03/29/23 1528  BP: 132/70  Pulse: (!) 53  Temp: 97.9 F (36.6 C)  TempSrc: Temporal  SpO2: 97%  Weight: 201 lb 3.2 oz (91.3 kg)  Height: 5\' 9"  (1.753 m)    Physical Exam Vitals reviewed.  Constitutional:      General: He is not in acute distress.    Appearance: He is well-developed. He is not ill-appearing (Appears fatigued, nontoxic), toxic-appearing or diaphoretic.  HENT:     Head: Normocephalic and atraumatic.  Neck:     Vascular: No carotid bruit or JVD.  Cardiovascular:     Rate and Rhythm: Normal rate and regular rhythm.     Heart sounds: Normal heart sounds. No murmur heard. Pulmonary:     Effort: Pulmonary effort is normal.     Breath sounds: Normal breath sounds. No rales.  Abdominal:  General: There is distension (Slight distention diffusely with diffuse tenderness to  palpation, all quadrants.  Hyperactive bowel sounds.).     Tenderness: There is abdominal tenderness.  Musculoskeletal:     Right lower leg: No edema.     Left lower leg: No edema.  Skin:    General: Skin is warm and dry.  Neurological:     Mental Status: He is alert and oriented to person, place, and time.  Psychiatric:        Mood and Affect: Mood normal.        Assessment & Plan:  HAMED DEBELLA [name] is a 62 y.o. male . Diarrhea, unspecified type  Abdominal distension  Dry heaves  Rigors  Decreased urine output  6-day history of diarrhea, noted day after ablation for atrial fibrillation.  He was started on colchicine, possible contributed to diarrhea versus less likely foodborne illness.  However he is also experience shaking chills, subjective fever, persistent abdominal pain - worse in the past 4 days as well as abdominal distention, dry heaves.  Additionally decreased urine output.  Differential includes C. difficile infection, obstruction, colitis/reticulitis, and possible AKI with volume depletion.  Further evaluation recommended to emergency room for other testing, possible IV fluids.  Rationale discussed with patient and all questions answered.  ER evaluation by private vehicle.  Vital signs stable in office.   No orders of the defined types were placed in this encounter.  Patient Instructions  I am sorry you are not feeling well.  As we discussed there can be many causes for diarrhea, and for you possible foodborne illness but that should have improved by now.  Colchicine is also commonly a cause of diarrhea, but typically do not see stomach distention, chills or feeling of fever.  That is concerning for possible infection.  Dry heaves are also concerning and I am also concerned about the decreased urine output the past few days and possible injury to your kidneys.  Further information and testing will need to be performed through the emergency room.  Please proceed to  the ER after leaving our office.  I will have notes in the system of my exam and concerns.  Hang in there!      Signed,   Meredith Staggers, MD [facility], [facility] 03/29/23 4:20 PM

## 2023-03-29 NOTE — ED Provider Notes (Signed)
[place] EMERGENCY DEPARTMENT AT [facility] Provider Note   CSN: [id number] Arrival date & time: 03/29/23  1709     History  Chief Complaint  Patient presents with   Abdominal Pain    Jesse PARIS [name] is a 62 y.o. male.  Patient is a 62 year old male with a history of hypertension, diastolic heart failure, atrial fibrillation failed amiodarone who had an ablation last week who is presenting today with complaints of ongoing diarrhea, abdominal pain, nausea and poor oral intake.  Patient reports that since his ablation he has been having up to 7 episodes of diarrhea per day.  He has had poor appetite.  He has tried Pepto-Bismol at home with minimal improvement.  He has had decreased urine output but denies any fever or blood in his stool.  He went to the outpatient clinic today because of his symptoms and they noticed that he was taking colchicine which he reports he received from the cardiologist but he is not sure why and finished his last dose yesterday.  They were concerned that that could be causing his diarrhea but sent him here for further evaluation.  He has had a hernia repair but denies other abdominal surgeries.  He reports the pain is diffuse.  The history is provided by the patient.  Abdominal Pain      Home Medications Prior to Admission medications   Medication Sig Start Date End Date Taking? Authorizing Provider  amiodarone (PACERONE) 200 MG tablet Take 1 tablet (200 mg total) by mouth daily. 03/22/23 04/21/23  Graciella Freer, PA-C  carbamide peroxide (DEBROX) 6.5 % OTIC solution Place 5 drops into both ears 2 (two) times daily. 03/17/23   Lula Olszewski, MD  Cholecalciferol (VITAMIN D-3) 125 MCG (5000 UT) TABS Take 5,000 Units by mouth daily. 03/17/23   Lula Olszewski, MD  cloNIDine (CATAPRES - DOSED IN MG/24 HR) 0.1 mg/24hr patch Place 1 patch (0.1 mg total) onto the skin once a week. 10/08/22   Lula Olszewski, MD  colchicine 0.6 MG tablet Take 1  tablet (0.6 mg total) by mouth 2 (two) times daily for 5 days. 03/22/23 03/27/23  Graciella Freer, PA-C  ezetimibe (ZETIA) 10 MG tablet Take 1 tablet (10 mg total) by mouth daily. 03/17/23   Lula Olszewski, MD  fluticasone [facility]) 50 MCG/ACT nasal spray Place 2 sprays into both nostrils daily. 02/17/23   Lula Olszewski, MD  isosorbide mononitrate (IMDUR) 30 MG 24 hr tablet Take 1 tablet (30 mg total) by mouth daily. Cannot take levitra with. 02/17/23   Lula Olszewski, MD  levothyroxine (SYNTHROID) 200 MCG tablet Take 1 tablet (200 mcg total) by mouth daily. Take as recommended by endocrine.  200 mg daily except for Sunday, take 100 mg. Patient taking differently: Take 100-200 mcg by mouth See admin instructions. Take as recommended by endocrine.  200 mg daily except for Sunday, take 100 mg. 09/15/22   Zigmund Daniel., MD  loratadine (CLARITIN) 10 MG tablet Take 1 tablet (10 mg total) by mouth daily. 02/17/23   Lula Olszewski, MD  losartan (COZAAR) 100 MG tablet Take 1 tablet (100 mg total) by mouth daily. 03/17/23   Lula Olszewski, MD  metoprolol tartrate (LOPRESSOR) 50 MG tablet Take 1 tablet (50 mg total) by mouth 2 (two) times daily. 02/25/23 05/26/23  Lula Olszewski, MD  montelukast (SINGULAIR) 10 MG tablet Take 1 tablet (10 mg total) by mouth at bedtime. TAKE 1  TABLET BY MOUTH EVERYDAY AT BEDTIME Strength: 10 mg 03/17/23   Lula Olszewski, MD  Multiple Vitamins-Minerals (MULTIVITAMIN WITH MINERALS) tablet Take 1 tablet by mouth daily. 03/17/23   Lula Olszewski, MD  nicotine (NICODERM CQ - DOSED IN MG/24 HOURS) 21 mg/24hr patch Place 1 patch (21 mg total) onto the skin daily. 03/17/23   Lula Olszewski, MD  pantoprazole (PROTONIX) 40 MG tablet Take 1 tablet (40 mg total) by mouth daily. 03/22/23 05/06/23  Graciella Freer, PA-C  rivaroxaban (XARELTO) 20 MG TABS tablet Take 1 tablet (20 mg total) by mouth daily with supper. 10/08/22   Lula Olszewski, MD  rosuvastatin (CRESTOR) 40 MG  tablet Take 1 tablet (40 mg total) by mouth daily. Replaces 20 mg dose 10/08/22 10/03/23  Lula Olszewski, MD  Saline (SIMPLY SALINE) 0.9 % AERS Place 2 each into the nose as directed. Use nightly for sinus hygiene long-term.  Can also be used as many times daily as desired to assist with clearing congested sinuses. 02/17/23   Lula Olszewski, MD  spironolactone (ALDACTONE) 25 MG tablet Take 1 tablet (25 mg total) by mouth daily. 02/25/23   Lula Olszewski, MD  tamsulosin (FLOMAX) 0.4 MG CAPS capsule Take 1 capsule (0.4 mg total) by mouth daily. 10/08/22   Lula Olszewski, MD      Allergies    Sulfa drugs cross reactors, Codeine, and Hydrochlorothiazide    Review of Systems   Review of Systems  Gastrointestinal:  Positive for abdominal pain.    Physical Exam Updated Vital Signs BP (!) 153/85 (BP Location: Right Arm)   Pulse (!) 54   Temp 97.9 F (36.6 C)   Resp 14   Ht 5\' 9"  (1.753 m)   Wt 91.3 kg   SpO2 98%   BMI 29.72 kg/m  Physical Exam Vitals and nursing note reviewed.  Constitutional:      General: He is not in acute distress.    Appearance: He is well-developed.  HENT:     Head: Normocephalic and atraumatic.     Mouth/Throat:     Mouth: Mucous membranes are dry.  Eyes:     Conjunctiva/sclera: Conjunctivae normal.     Pupils: Pupils are equal, round, and reactive to light.  Cardiovascular:     Rate and Rhythm: Regular rhythm. Bradycardia present.     Heart sounds: No murmur heard. Pulmonary:     Effort: Pulmonary effort is normal. No respiratory distress.     Breath sounds: Normal breath sounds. No wheezing or rales.  Abdominal:     General: Bowel sounds are normal. There is no distension.     Palpations: Abdomen is soft.     Tenderness: There is generalized abdominal tenderness. There is guarding. There is no rebound.  Musculoskeletal:        General: No tenderness. Normal range of motion.     Cervical back: Normal range of motion and neck supple.     Right  lower leg: No edema.     Left lower leg: No edema.  Skin:    General: Skin is warm and dry.     Findings: No erythema or rash.  Neurological:     Mental Status: He is alert and oriented to person, place, and time. Mental status is at baseline.  Psychiatric:        Mood and Affect: Mood normal.        Behavior: Behavior normal.     ED Results /  Procedures / Treatments   Labs (all labs ordered are listed, but only abnormal results are displayed) Labs Reviewed  CBC - Abnormal; Notable for the following components:      Result Value   WBC 13.2 (*)    All other components within normal limits  URINALYSIS, ROUTINE W REFLEX MICROSCOPIC - Abnormal; Notable for the following components:   Specific Gravity, Urine 1.032 (*)    Hgb urine dipstick SMALL (*)    Protein, ur 30 (*)    All other components within normal limits  LIPASE, BLOOD  COMPREHENSIVE METABOLIC PANEL    EKG None  Radiology CT ABDOMEN PELVIS W CONTRAST  Result Date: 03/29/2023 CLINICAL DATA:  Abdominal pain EXAM: CT ABDOMEN AND PELVIS WITH CONTRAST TECHNIQUE: Multidetector CT imaging of the abdomen and pelvis was performed using the standard protocol following bolus administration of intravenous contrast. RADIATION DOSE REDUCTION: This exam was performed according to the departmental dose-optimization program which includes automated exposure control, adjustment of the mA and/or kV according to patient size and/or use of iterative reconstruction technique. CONTRAST:  85mL OMNIPAQUE IOHEXOL 300 MG/ML  SOLN COMPARISON:  CT abdomen and pelvis dated August 21st 2022 FINDINGS: Lower chest: Emphysema. Elevation of the right hemidiaphragm and right lower lobe atelectasis. Trace right effusion. Hepatobiliary: No focal liver abnormality is seen. Status post cholecystectomy. No biliary dilatation. Pancreas: Unremarkable. No pancreatic ductal dilatation or surrounding inflammatory changes. Spleen: Normal in size without focal  abnormality. Adrenals/Urinary Tract: Bilateral adrenal glands are unremarkable. Hydronephrosis or nephrolithiasis. Simple ovarian cyst of the right kidney, no specific follow-up imaging is necessary. Bladder wall thickening. Stomach/Bowel: Stomach is within normal limits. Mild diverticulosis. No evidence of bowel wall thickening, distention, or inflammatory changes. Vascular/Lymphatic: Aortic atherosclerosis. No enlarged abdominal or pelvic lymph nodes. Reproductive: Mild Prostatomegaly. Other: No abdominal wall hernia or abnormality. No abdominopelvic ascites. Musculoskeletal: No acute or significant osseous findings. IMPRESSION: 1. No acute findings in the abdomen or pelvis. 2. Similar bladder wall thickening, likely due to chronic outlet obstruction given associated prostatomegaly. 3. Trace right effusion. 4. Aortic Atherosclerosis (ICD10-I70.0) and Emphysema (ICD10-J43.9). Electronically Signed   By: Allegra Lai M.D.   On: 03/29/2023 19:43    Procedures Procedures    Medications Ordered in ED Medications  lactated ringers bolus 1,000 mL (0 mLs Intravenous Stopped 03/29/23 1939)  iohexol (OMNIPAQUE) 300 MG/ML solution 100 mL (85 mLs Intravenous Contrast Given 03/29/23 1902)    ED Course/ Medical Decision Making/ A&P                             Medical Decision Making Amount and/or Complexity of Data Reviewed External Data Reviewed: notes. Labs: ordered. Decision-making details documented in ED Course. Radiology: ordered and independent interpretation performed. Decision-making details documented in ED Course.  Risk Prescription drug management.   Pt with multiple medical problems and comorbidities and presenting today with a complaint that caries a high risk for morbidity and mortality.  Patient here today with complaints of anorexia abdominal pain and diarrhea.  Concerned that the colchicine was causing the symptoms versus acute GI pathology such as food poisoning, viral etiology  versus diverticulitis.  Lower suspicion for appendicitis, pancreatitis or hepatitis.  Also concern for dehydration, electrolyte abnormality and AKI.  Patient did recently have an ablation but appears to be in rhythm today with heart rates in the 50s on continuous cardiac monitoring.  I independently interpreted patient's labs and UA with elevated specific gravity but  no evidence of infection, lipase, CMP are within normal limits and CBC with mild leukocytosis of 13.  Patient given IV fluids, CT for further evaluation pending.  8:24 PM I have independently visualized and interpreted pt's images today.  CT today negative for obstruction, perforation or diverticulitis.  Radiology reports no acute findings with similar bladder wall thickening consistent with chronic outlet obstruction.  On repeat evaluation patient is feeling better after fluids.  He is tolerating p.o.'s.  Discussed the findings of the scan with him and feel at this time he is stable for discharge.          Final Clinical Impression(s) / ED Diagnoses Final diagnoses:  Dehydration  Diarrhea due to drug    Rx / DC Orders ED Discharge Orders     None         Gwyneth Sprout, MD 03/29/23 2024

## 2023-03-29 NOTE — ED Notes (Signed)
Pt states he is "allergic to" cardiac electrodes.

## 2023-03-29 NOTE — Telephone Encounter (Signed)
FYI: This call has been transferred to triage nurse: Access Nurse. Once the result note has been entered staff can address the message at that time.  Patient called in with the following symptoms:  Red Word: Nausea, vomiting, and diarrhea x 1 week following Atrial fibrillation ablation on 7/8   Please advise at Mobile [phone] (mobile)  Message is routed to Provider Pool.

## 2023-03-29 NOTE — Telephone Encounter (Signed)
Final Outcome: See HCP within 4 hours. Pt has been scheduled to see Dr. Neva Seat on 7/15 @ 3:20pm.  Patient Name First: Jesse Fus Last: Suarez Gender: Male DOB: 1960/09/18 Age: 62 Y 3 M 15 D Return Phone Number: [phone] (Primary) Address: City/ State/ Zip: [place]  [address] Client [facility], [facility] Provider Glenetta Hew- MD Contact Type Call Who Is Calling Patient / Member / Family / Caregiver Call Type Triage / Clinical Relationship To Patient Self Return Phone Number [phone] (Primary) Chief Complaint Vomiting Reason for Call Symptomatic / Request for Health Information Initial Comment Caller states she has a patient who has been experiencing vomiting and diarrhea for a week following his afib ablation on July 8th. he has been able to eat but it comes out the other end. He feels weak also. he is also getting lightheaded and has chills and sweats Translation No Nurse Assessment Nurse: Jayme Cloud, RN, Elmyra Ricks Date/Time (Eastern Time): 03/29/2023 12:50:56 PM Confirm and document reason for call. If symptomatic, describe symptoms. ---He had eaten a roast beef sandwich on Monday night after the procedure, and next day started with nausea and vomitng, progressing to diarrhea later in the week. Every time he swallows food or drink he gets nausea. Diarrhea for about one week. Does the patient have any new or worsening symptoms? ---Yes Will a triage be completed? ---Yes Related visit to physician within the last 2 weeks? ---Yes Does the PT have any chronic conditions? (i.e. diabetes, asthma, this includes High risk factors for pregnancy, etc.) ---Yes List chronic conditions. ---a-fib ablation Is this a behavioral health or substance abuse call? ---No Guidelines Guideline Title Affirmed Question Affirmed Notes Nurse Date/Time (Eastern Time) Diarrhea [1] SEVERE  diarrhea (e.g., 7 or more times / day more than Hulen Luster 03/29/2023 12:54:49 PM Guidelines Guideline Title Affirmed Question Affirmed Notes Nurse Date/Time Lamount Cohen Time) normal) AND [2] age > 60 years Disp. Time Lamount Cohen Time) Disposition Final User 03/29/2023 1:01:41 PM See HCP within 4 Hours (or PCP triage) Yes Jayme Cloud, RN, Elmyra Ricks Final Disposition 03/29/2023 1:01:41 PM See HCP within 4 Hours (or PCP triage) Yes Jayme Cloud, RN, Rodney Cruise Disagree/Comply Comply Caller Understands Yes PreDisposition Call Doctor Care Advice Given Per Guideline CLEAR FLUIDS: * Drink more fluids. * Sip water or a half-strength sports drink (e.g., Gatorade, Powerade; mix half and half with water). SEE HCP (OR PCP TRIAGE) WITHIN 4 HOURS: * IF OFFICE WILL BE OPEN: You need to be seen within the next 3 or 4 hours. Call your doctor (or NP/PA) now or as soon as the office opens. Comments User: Christean Grief, RN Date/Time Lamount Cohen Time): 03/29/2023 12:54:06 PM Call lost during assessment. User: Christean Grief, RN Date/Time Lamount Cohen Time): 03/29/2023 12:56:48 PM colchicine, pantoprazole (Protonix) User: Christean Grief, RN Date/Time Lamount Cohen Time): 03/29/2023 1:05:42 PM Caller scheduled at [facility] office. 3 PM Referrals REFERRED TO PCP OFFIC

## 2023-03-29 NOTE — Patient Instructions (Signed)
I am sorry you are not feeling well.  As we discussed there can be many causes for diarrhea, and for you possible foodborne illness but that should have improved by now.  Colchicine is also commonly a cause of diarrhea, but typically do not see stomach distention, chills or feeling of fever.  That is concerning for possible infection.  Dry heaves are also concerning and I am also concerned about the decreased urine output the past few days and possible injury to your kidneys.  Further information and testing will need to be performed through the emergency room.  Please proceed to the ER after leaving our office.  I will have notes in the system of my exam and concerns.  Hang in there!

## 2023-03-29 NOTE — ED Triage Notes (Addendum)
Patient here POV from Home.  Endorses Generalized ABD Pain, decreased Urine Output, Some Nausea, Diarrhea for about 1 week. Recently finished Colchicine.  No emesis. No Known Fevers.   NAD Noted during Triage. A&Ox4. GCS 15. Ambulatory.

## 2023-03-29 NOTE — Discharge Instructions (Addendum)
The blood work today looks good and your CAT scan was normal.  The diarrhea was most likely coming from the medication which now that you are no longer taking should improve.  Make sure you continue to drink fluids and you can also have toast, applesauce, bananas that can also help with diarrhea

## 2023-03-31 ENCOUNTER — Encounter: Payer: Self-pay | Admitting: Internal Medicine

## 2023-03-31 ENCOUNTER — Ambulatory Visit (INDEPENDENT_AMBULATORY_CARE_PROVIDER_SITE_OTHER): Payer: 59 | Admitting: Internal Medicine

## 2023-03-31 VITALS — BP 156/96 | HR 54 | Temp 97.8°F | Ht 69.0 in | Wt 205.0 lb

## 2023-03-31 DIAGNOSIS — E785 Hyperlipidemia, unspecified: Secondary | ICD-10-CM

## 2023-03-31 DIAGNOSIS — Z0000 Encounter for general adult medical examination without abnormal findings: Secondary | ICD-10-CM | POA: Diagnosis not present

## 2023-03-31 DIAGNOSIS — R109 Unspecified abdominal pain: Secondary | ICD-10-CM | POA: Insufficient documentation

## 2023-03-31 DIAGNOSIS — I1A0 Resistant hypertension: Secondary | ICD-10-CM

## 2023-03-31 DIAGNOSIS — K5790 Diverticulosis of intestine, part unspecified, without perforation or abscess without bleeding: Secondary | ICD-10-CM | POA: Insufficient documentation

## 2023-03-31 DIAGNOSIS — R7303 Prediabetes: Secondary | ICD-10-CM

## 2023-03-31 DIAGNOSIS — R8290 Unspecified abnormal findings in urine: Secondary | ICD-10-CM

## 2023-03-31 DIAGNOSIS — I700 Atherosclerosis of aorta: Secondary | ICD-10-CM

## 2023-03-31 DIAGNOSIS — R1013 Epigastric pain: Secondary | ICD-10-CM

## 2023-03-31 DIAGNOSIS — F17210 Nicotine dependence, cigarettes, uncomplicated: Secondary | ICD-10-CM

## 2023-03-31 DIAGNOSIS — I739 Peripheral vascular disease, unspecified: Secondary | ICD-10-CM

## 2023-03-31 DIAGNOSIS — M47814 Spondylosis without myelopathy or radiculopathy, thoracic region: Secondary | ICD-10-CM

## 2023-03-31 DIAGNOSIS — R3129 Other microscopic hematuria: Secondary | ICD-10-CM | POA: Insufficient documentation

## 2023-03-31 DIAGNOSIS — I480 Paroxysmal atrial fibrillation: Secondary | ICD-10-CM

## 2023-03-31 DIAGNOSIS — E669 Obesity, unspecified: Secondary | ICD-10-CM

## 2023-03-31 DIAGNOSIS — E782 Mixed hyperlipidemia: Secondary | ICD-10-CM

## 2023-03-31 DIAGNOSIS — E876 Hypokalemia: Secondary | ICD-10-CM

## 2023-03-31 DIAGNOSIS — E039 Hypothyroidism, unspecified: Secondary | ICD-10-CM

## 2023-03-31 DIAGNOSIS — M1A09X0 Idiopathic chronic gout, multiple sites, without tophus (tophi): Secondary | ICD-10-CM

## 2023-03-31 HISTORY — DX: Unspecified abdominal pain: R10.9

## 2023-03-31 MED ORDER — CLONIDINE 0.2 MG/24HR TD PTWK
0.2000 mg | MEDICATED_PATCH | TRANSDERMAL | 12 refills | Status: DC
Start: 2023-03-31 — End: 2023-04-14

## 2023-03-31 NOTE — Assessment & Plan Note (Addendum)
Atrial fibrillation: Post-ablation, he has not reported any palpitations and is currently on Xarelto and Amiodarone. We will continue both medications and discuss the potential discontinuation of Amiodarone with cardiology at the next visit.Probably cured. Encouraged continuing with Xarelto. He feels better since ablation. Encouraged patient to discuss with cardiology about tapering off vs stop vs continue(s) with amiodarone.

## 2023-03-31 NOTE — Assessment & Plan Note (Addendum)
  Hypertension: His blood pressure was slightly elevated at today's visit while on a Clonidine patch. We will increase the Clonidine patch dose and recheck his blood pressure in 2 weeks.

## 2023-03-31 NOTE — Assessment & Plan Note (Signed)
February 26, 2023 needs we didn't get to discuss: Eligible for Lung Cancer Screening Tobacco counseling needed Depression Monitoring Refresh HCC Diagnosis  General Health Maintenance: He should continue his current medications, including Flomax and Xarelto, and keep monitoring his blood pressure at home. He will return for a follow-up in 2 weeks to reassess blood pressure control.

## 2023-03-31 NOTE — Progress Notes (Signed)
[facility]: [phone]   Routine Medical Office Visit  Patient:  Jesse SELLIN [name]      Age: 62 y.o.       Sex:  male  Date:   03/31/2023 Patient Care Team: Lula Olszewski, MD as PCP - General (Internal Medicine) Marinus Maw, MD as PCP - Electrophysiology (Cardiology) Reather Littler, MD as Consulting Physician (Endocrinology) Marinus Maw, MD as Consulting Physician (Cardiology) Jeani Hawking, MD as Consulting Physician (Gastroenterology) Mealor, Roberts Gaudy, MD as Consulting Physician (Cardiology) Today's Healthcare Provider: Lula Olszewski, MD   Assessment and Plan:     Wylder was seen today for 2 week follow-up.  Epigastric pain Overview: Epigastric and diffuse, emergency room visit did excellent evaluation. Seems to be improving with protonix and hold colchicine.  Leucocytosis noted  Assessment & Plan:  Gastrointestinal upset: He experienced a recent episode of severe abdominal pain, diarrhea, and loss of appetite, likely an adverse reaction to colchicine. Since discontinuing colchicine, his pain has improved, and the diarrhea has resolved. We will discontinue colchicine for now, continue Pantoprazole, and consider fiber supplementation for constipation. Reevaluation is planned in 2 weeks.Repeat abnormal urinalysis Offered repeat abnormal white blood cell(s) Improving   Paroxysmal atrial fibrillation with RVR (HCC) Overview: Ablation 7/8/204 No palpitations since, but still on Xarelto for now  Assessment & Plan: Atrial fibrillation: Post-ablation, he has not reported any palpitations and is currently on Xarelto and Amiodarone. We will continue both medications and discuss the potential discontinuation of Amiodarone with cardiology at the next visit.Probably cured. Encouraged continuing with Xarelto. He feels better since ablation. Encouraged patient to discuss with cardiology about tapering off vs stop vs continue(s) with  amiodarone.   Resistant hypertension Overview: Interim history from March 31, 2023:    Home readings: high at home and high here too. Patient reports taking current medications consistently and not experiencing any significant associated side effects or symptoms. Current hypertension medications:       Sig   cloNIDine (CATAPRES - DOSED IN MG/24 HR) 0.1 mg/24hr patch (Taking) Place 1 patch (0.1 mg total) onto the skin once a week.   losartan (COZAAR) 100 MG tablet (Taking) Take 1 tablet (100 mg total) by mouth daily.   metoprolol tartrate (LOPRESSOR) 50 MG tablet (Taking) Take 1 tablet (50 mg total) by mouth 2 (two) times daily.   spironolactone (ALDACTONE) 25 MG tablet (Taking) Take 1 tablet (25 mg total) by mouth daily.      Lab Results  Component Value Date   NA 137 03/29/2023   K 3.7 03/29/2023   CREATININE 1.05 03/29/2023   GFR 58.18 (L) 03/01/2023     Assessment & Plan:  Hypertension: His blood pressure was slightly elevated at today's visit while on a Clonidine patch. We will increase the Clonidine patch dose and recheck his blood pressure in 2 weeks.    Orders: -     cloNIDine; Place 1 patch (0.2 mg total) onto the skin once a week. Replaces 0.1 mg/24hr patch. To help control blood pressure.  Dispense: 4 patch; Refill: 12  Dyslipidemia  Hypokalemia Overview:  moderate (2.8 - 3.5 meq/dl) Recent started spironolactone Lab Results  Component Value Date/Time   K 3.7 03/29/2023 05:26 PM   K 4.0 03/08/2023 11:04 AM   K 3.2 (L) 03/01/2023 10:09 AM   K 4.2 02/25/2023 03:37 PM   K 3.9 11/30/2022 10:29 AM   K 3.9 10/19/2022 10:03 AM   K 4.3 09/23/2022 12:39 AM  Mixed hyperlipidemia Overview: >>OVERVIEW FOR DYSLIPIDEMIA WRITTEN ON 03/31/2023  1:53 PM BY Lula Olszewski, MD  Medications: not yet discussed Lab Results  Component Value Date   HDL 52.70 03/31/2022   HDL 40.40 06/26/2021   HDL 46.00 08/13/2020   CHOLHDL 3 03/31/2022   CHOLHDL 5 06/26/2021    CHOLHDL 4 08/13/2020   Lab Results  Component Value Date   LDLCALC 84 03/31/2022   LDLCALC 106 (H) 06/26/2021   LDLCALC 70 09/25/2019   LDLDIRECT 102.0 08/13/2020   Lab Results  Component Value Date   TRIG 155.0 (H) 03/31/2022   TRIG 190.0 (H) 06/26/2021   TRIG 208.0 (H) 08/13/2020   Lab Results  Component Value Date   CHOL 167 03/31/2022   CHOL 185 06/26/2021   CHOL 173 08/13/2020   The 10-year ASCVD risk score (Arnett DK, et al., 2019) is: 22.6%   Values used to calculate the score:     Age: 44 years     Sex: Male     Is Non-Hispanic African American: No     Diabetic: No     Tobacco smoker: Yes     Systolic Blood Pressure: 156 mmHg     Is BP treated: Yes     HDL Cholesterol: 52.7 mg/dL     Total Cholesterol: 167 mg/dL Lab Results  Component Value Date   ALT 25 03/29/2023   AST 18 03/29/2023   ALKPHOS 85 03/29/2023   TSH 8.05 (H) 03/01/2023   HGBA1C 6.1 (A) 03/04/2023   Body mass index is 30.27 kg/m.  Lipoprotein(a), Apolipoprotein B (ApoB), and High-sensitivity C-reactive protein (hs-CRP) No results found for: "HSCRP", "LIPOA" Improving Your Cholesterol: Diet: Focus on a Mediterranean-style diet, limit saturated fats and sugars, and increase omega-3 fatty acids (fish, flaxseeds,nuts,extra virgin olive oil, avocados). Exercise: Engage in regular physical activity (aerobic exercises are particularly beneficial for HDL). Weight Management: Maintain a healthy weight. Smoking Cessation: Quitting smoking improves cholesterol levels.    Prediabetes Overview: Lab Results  Component Value Date   HGBA1C 6.1 (A) 03/04/2023     Assessment & Plan: Prediabetes: His A1c is 6.1, indicating slightly elevated average blood sugars. We encourage weight loss and exercise. AVS handout given   Healthcare maintenance Assessment & Plan: February 26, 2023 needs we didn't get to discuss: Eligible for Lung Cancer Screening Tobacco counseling needed Depression  Monitoring Refresh HCC Diagnosis  General Health Maintenance: He should continue his current medications, including Flomax and Xarelto, and keep monitoring his blood pressure at home. He will return for a follow-up in 2 weeks to reassess blood pressure control.        Aortic atherosclerosis (HCC)  Peripheral arterial disease (HCC) Overview: Aortic atherosclerosis    Cigarette smoker Assessment & Plan: Smoking cessation instruction/counseling given:  counseled patient on the dangers of tobacco use, advised patient to stop smoking, and reviewed strategies to maximize success  He reports he already has access to the therapies.  Tobacco use: He has smoked half a pack per day for 20 years, with the last lung cancer screening in 2020. We encourage smoking cessation and will consider a repeat lung cancer screening. However, he just completed coronary CT with no findings so will hold off.   Abnormal urinalysis -     Urinalysis, Routine w reflex microscopic  Thoracic spondylosis  Diverticular disease  Obesity due to energy imbalance  Idiopathic chronic gout of multiple sites without tophus Overview: Flare 02/2023 resolved with prednisone, podagra No results found for: "LABURIC" Not  on allopurinol   Assessment & Plan: Gout: He has had no recent flares. Colchicine was used for prophylaxis but will be discontinued due to the possible adverse reaction. We will consider alternative prophylaxis if gout flares recur.   Acquired hypothyroidism Overview: Associated with amiodarone for atrial fibrillation On synthroid at least since 2013 Endocrinology Dr. Reather Littler managed  06/21/12 to 02/2023, then retired  Lab Results  Component Value Date/Time   TSH 8.05 (H) 03/01/2023 10:09 AM   TSH 0.22 (L) 11/30/2022 10:29 AM   TSH 0.643 10/19/2022 10:03 AM   TSH 0.26 (L) 08/31/2022 09:23 AM   TSH 8.41 (H) 07/06/2022 10:26 AM    Lab Results  Component Value Date/Time   FREET4 1.12  03/01/2023 10:09 AM   FREET4 1.52 11/30/2022 10:29 AM   FREET4 2.27 (H) 10/19/2022 10:03 AM   FREET4 1.39 08/31/2022 09:23 AM   FREET4 0.93 07/06/2022 10:26 AM      anti-TPO antibodies: N/A  anti-Tg antibodies: N/A  Anti-TSI antibodies: N/A   Family history of thyroid disease: Patient last evaluated for thyroid nodules: Last ultrasound for thyroid nodules: N/A  Last heart exam for rhythm check:    We will follow up in 2 weeks to reassess abdominal pain, diarrhea, and blood pressure.   Future Appointments  Date Time Provider Department Center  04/14/2023  2:20 PM Lula Olszewski, MD [facility]  04/19/2023 11:00 AM Danice Goltz, [place] [facility] None  06/07/2023  9:20 AM Altamese [place], MD [facility] None  06/22/2023  2:45 PM Mealor, Roberts Gaudy, MD [facility]  06/24/2023  2:00 PM Lula Olszewski, MD [facility]           Clinical Presentation:    62 y.o. male who has Atrial fibrillation (paroxysmal with history of RVR; Resistant hypertension; Depression with anxiety; ED (erectile dysfunction); Hx of Bell's palsy; Amputated toe (HCC); Cigarette smoker; Bone neoplasm; PVC's (premature ventricular contractions); COPD GOLD 0/ emphysema on CT ; Vitamin D deficiency; Iron deficiency anemia; Tobacco use disorder; Mood disorder (HCC); Prolonged QT interval; Lumbar degenerative disc disease; Bladder dysfunction; Seasonal allergies; Major depressive disorder, single episode, in remission (HCC); Gout; Hypothyroidism; Hyperlipidemia; CAD (coronary artery disease); Prediabetes; Abdominal pain; Peripheral arterial disease (HCC); Diverticular disease; Thoracic spondylosis; Abnormal urinalysis; Aortic atherosclerosis (HCC); and Obesity due to energy imbalance on their problem list. His reasons/main concerns/chief complaints for today's office visit are 2 week follow-up   AI-Extracted: Discussed the use of AI scribe software for clinical note transcription with the patient, who gave  verbal consent to proceed.  History of Present Illness   The patient, with a history of gout, presented with a week-long episode of severe diarrhea, abdominal pain, and loss of appetite. The symptoms began after the initiation of allopurinol for gout management. The patient reported going to the bathroom seven to eight times a day, with the abdominal pain being particularly severe. He also reported a lack of hunger during this period. The diarrhea and abdominal pain have since resolved, and his appetite is gradually returning.  He also reported a history of atrial fibrillation, for which he underwent an ablation procedure in July. Since the procedure, the patient has not experienced any palpitations. He is currently on blood thinners and amiodarone, with a follow-up appointment with his cardiologist scheduled for August.  In addition to these conditions, the patient has a history of hypertension, which is being managed with a clonidine patch. He reported that his blood pressure readings have been variable, with some  readings being slightly elevated.  He also has a history of prediabetes, with an A1C of 6.1. He is a current smoker, smoking half a pack a day for the past 20 years. The patient expressed interest in quitting smoking and has previously tried nicotine patches.  He also reported a history of a bone neoplasm in his leg, which was diagnosed in 2016. The patient underwent treatment, and the neoplasm was later determined to be scar tissue.  His sleep has been disturbed since his ablation procedure, which he attributes to stress related to personal issues. He also reported a history of a stomach condition, for which he is currently taking Pantoprazole. The patient reported that his stomach pain has improved since starting the medication.     Reviewed chart data:  has a past medical history of Acute hypoxemic respiratory failure (HCC) (09/18/2022), Allergy, Anxiety, Atrial fibrillation (HCC),  Atrial fibrillation with RVR (HCC) (09/12/2022), Bell palsy, Depression, Diastolic heart failure, HAP (hospital-acquired pneumonia) (09/17/2022), Hypercholesterolemia, Hypertension, Hypokalemia (03/07/2023), Pneumonia, Pneumonia of right lower lobe due to infectious organism (09/17/2022), and Thyroid disease.   Outpatient Medications Prior to Visit  Medication Sig   amiodarone (PACERONE) 200 MG tablet Take 1 tablet (200 mg total) by mouth daily.   carbamide peroxide (DEBROX) 6.5 % OTIC solution Place 5 drops into both ears 2 (two) times daily.   Cholecalciferol (VITAMIN D-3) 125 MCG (5000 UT) TABS Take 5,000 Units by mouth daily.   ezetimibe (ZETIA) 10 MG tablet Take 1 tablet (10 mg total) by mouth daily.   fluticasone (FLONASE) 50 MCG/ACT nasal spray Place 2 sprays into both nostrils daily.   isosorbide mononitrate (IMDUR) 30 MG 24 hr tablet Take 1 tablet (30 mg total) by mouth daily. Cannot take levitra with.   levothyroxine (SYNTHROID) 200 MCG tablet Take 1 tablet (200 mcg total) by mouth daily. Take as recommended by endocrine.  200 mg daily except for Sunday, take 100 mg. (Patient taking differently: Take 100-200 mcg by mouth See admin instructions. Take as recommended by endocrine.  200 mg daily except for Sunday, take 100 mg.)   loratadine (CLARITIN) 10 MG tablet Take 1 tablet (10 mg total) by mouth daily.   losartan (COZAAR) 100 MG tablet Take 1 tablet (100 mg total) by mouth daily.   metoprolol tartrate (LOPRESSOR) 50 MG tablet Take 1 tablet (50 mg total) by mouth 2 (two) times daily.   montelukast (SINGULAIR) 10 MG tablet Take 1 tablet (10 mg total) by mouth at bedtime. TAKE 1 TABLET BY MOUTH EVERYDAY AT BEDTIME Strength: 10 mg   Multiple Vitamins-Minerals (MULTIVITAMIN WITH MINERALS) tablet Take 1 tablet by mouth daily.   nicotine (NICODERM CQ - DOSED IN MG/24 HOURS) 21 mg/24hr patch Place 1 patch (21 mg total) onto the skin daily.   pantoprazole (PROTONIX) 40 MG tablet Take 1 tablet (40  mg total) by mouth daily.   rivaroxaban (XARELTO) 20 MG TABS tablet Take 1 tablet (20 mg total) by mouth daily with supper.   rosuvastatin (CRESTOR) 40 MG tablet Take 1 tablet (40 mg total) by mouth daily. Replaces 20 mg dose   Saline (SIMPLY SALINE) 0.9 % AERS Place 2 each into the nose as directed. Use nightly for sinus hygiene long-term.  Can also be used as many times daily as desired to assist with clearing congested sinuses.   spironolactone (ALDACTONE) 25 MG tablet Take 1 tablet (25 mg total) by mouth daily.   tamsulosin (FLOMAX) 0.4 MG CAPS capsule Take 1 capsule (0.4 mg total)  by mouth daily.   [DISCONTINUED] cloNIDine (CATAPRES - DOSED IN MG/24 HR) 0.1 mg/24hr patch Place 1 patch (0.1 mg total) onto the skin once a week.   [DISCONTINUED] colchicine 0.6 MG tablet Take 1 tablet (0.6 mg total) by mouth 2 (two) times daily for 5 days.   No facility-administered medications prior to visit.         Clinical Data Analysis:   Physical Exam  BP (!) 156/96 (BP Location: Left Arm, Patient Position: Sitting)   Pulse (!) 54   Temp 97.8 F (36.6 C) (Temporal)   Ht 5\' 9"  (1.753 m)   Wt 205 lb (93 kg)   SpO2 97%   BMI 30.27 kg/m  Wt Readings from Last 10 Encounters:  03/31/23 205 lb (93 kg)  03/29/23 201 lb 4.5 oz (91.3 kg)  03/29/23 201 lb 3.2 oz (91.3 kg)  03/22/23 208 lb (94.3 kg)  03/17/23 204 lb 9.6 oz (92.8 kg)  03/04/23 205 lb (93 kg)  02/25/23 208 lb 9.6 oz (94.6 kg)  02/17/23 205 lb 3.2 oz (93.1 kg)  12/03/22 207 lb 12.8 oz (94.3 kg)  11/26/22 209 lb (94.8 kg)   Vital signs reviewed.  Nursing notes reviewed. Weight trend reviewed. Abnormalities and Problem-Specific physical exam findings:  truncal adiposity   General Appearance:  No acute distress appreciable.   Well-groomed, healthy-appearing male.  Well proportioned with no abnormal fat distribution.  Good muscle tone. Skin: Clear and well-hydrated. Pulmonary:  Normal work of breathing at rest, no respiratory distress  apparent. SpO2: 97 %  Neurological:  Awake, alert, oriented, and engaged.  No obvious focal neurological deficits or cognitive impairments.  Sensorium seems unclouded.   Speech is clear and coherent with logical content. Psychiatric:  Appropriate mood, pleasant and cooperative demeanor, thoughtful and engaged during the exam  Results Reviewed:      No results found for any visits on 03/31/23.  Admission on 03/29/2023, Discharged on 03/29/2023  Component Date Value   Lipase 03/29/2023 36    Sodium 03/29/2023 137    Potassium 03/29/2023 3.7    Chloride 03/29/2023 103    CO2 03/29/2023 27    Glucose, Bld 03/29/2023 90    BUN 03/29/2023 12    Creatinine, Ser 03/29/2023 1.05    Calcium 03/29/2023 9.0    Total Protein 03/29/2023 7.1    Albumin 03/29/2023 4.3    AST 03/29/2023 18    ALT 03/29/2023 25    Alkaline Phosphatase 03/29/2023 85    Total Bilirubin 03/29/2023 0.7    GFR, Estimated 03/29/2023 >60    Anion gap 03/29/2023 7    WBC 03/29/2023 13.2 (H)    RBC 03/29/2023 5.56    Hemoglobin 03/29/2023 16.6    HCT 03/29/2023 50.5    MCV 03/29/2023 90.8    MCH 03/29/2023 29.9    MCHC 03/29/2023 32.9    RDW 03/29/2023 15.2    Platelets 03/29/2023 235    nRBC 03/29/2023 0.0    Color, Urine 03/29/2023 YELLOW    APPearance 03/29/2023 CLEAR    Specific Gravity, Urine 03/29/2023 1.032 (H)    pH 03/29/2023 6.0    Glucose, UA 03/29/2023 NEGATIVE    Hgb urine dipstick 03/29/2023 SMALL (A)    Bilirubin Urine 03/29/2023 NEGATIVE    Ketones, ur 03/29/2023 NEGATIVE    Protein, ur 03/29/2023 30 (A)    Nitrite 03/29/2023 NEGATIVE    Leukocytes,Ua 03/29/2023 NEGATIVE    RBC / HPF 03/29/2023 0-5    WBC, UA 03/29/2023 0-5  Bacteria, UA 03/29/2023 NONE SEEN    Squamous Epithelial / HPF 03/29/2023 0-5    Mucus 03/29/2023 PRESENT    Hyaline Casts, UA 03/29/2023 PRESENT   Admission on 03/22/2023, Discharged on 03/22/2023  Component Date Value   Activated Clotting Time 03/22/2023 293     Activated Clotting Time 03/22/2023 330   Lab on 03/08/2023  Component Date Value   Glucose 03/08/2023 142 (H)    BUN 03/08/2023 12    Creatinine, Ser 03/08/2023 1.23    eGFR 03/08/2023 66    BUN/Creatinine Ratio 03/08/2023 10    Sodium 03/08/2023 141    Potassium 03/08/2023 4.0    Chloride 03/08/2023 99    CO2 03/08/2023 25    Calcium 03/08/2023 8.7    WBC 03/08/2023 10.0    RBC 03/08/2023 5.82 (H)    Hemoglobin 03/08/2023 16.9    Hematocrit 03/08/2023 53.8 (H)    MCV 03/08/2023 92    MCH 03/08/2023 29.0    MCHC 03/08/2023 31.4 (L)    RDW 03/08/2023 14.4    Platelets 03/08/2023 209   Office Visit on 03/04/2023  Component Date Value   Hemoglobin A1C 03/04/2023 6.1 (A)    POC Glucose 03/04/2023 134 (A)   Lab on 03/01/2023  Component Date Value   Free T4 03/01/2023 1.12    TSH 03/01/2023 8.05 (H)    Sodium 03/01/2023 136    Potassium 03/01/2023 3.2 (L)    Chloride 03/01/2023 94 (L)    CO2 03/01/2023 31    Glucose, Bld 03/01/2023 182 (H)    BUN 03/01/2023 14    Creatinine, Ser 03/01/2023 1.30    GFR 03/01/2023 58.18 (L)    Calcium 03/01/2023 9.0   Office Visit on 02/25/2023  Component Date Value   Sodium 02/25/2023 137    Potassium 02/25/2023 4.2    Chloride 02/25/2023 100    CO2 02/25/2023 29    Glucose, Bld 02/25/2023 119 (H)    BUN 02/25/2023 16    Creatinine, Ser 02/25/2023 1.15    GFR 02/25/2023 67.40    Calcium 02/25/2023 8.9    Aldosterone 02/25/2023 3    Renin Activity 02/25/2023 0.12 (L)    ALDO / PRA Ratio 02/25/2023 25.0    Total Volume 03/01/2023 1,500    METANEPHRINE 03/01/2023 59 (L)    NORMETANEPHRINE 03/01/2023 249    METANEPHRINES, TOTAL 03/01/2023 308   Lab on 11/30/2022  Component Date Value   Free T4 11/30/2022 1.52    TSH 11/30/2022 0.22 (L)    Sodium 11/30/2022 139    Potassium 11/30/2022 3.9    Chloride 11/30/2022 100    CO2 11/30/2022 30    Glucose, Bld 11/30/2022 138 (H)    BUN 11/30/2022 12    Creatinine, Ser 11/30/2022 1.11     GFR 11/30/2022 70.44    Calcium 11/30/2022 9.1   Lab on 10/19/2022  Component Date Value   Glucose 10/19/2022 130 (H)    BUN 10/19/2022 10    Creatinine, Ser 10/19/2022 0.83    eGFR 10/19/2022 98    BUN/Creatinine Ratio 10/19/2022 12    Sodium 10/19/2022 140    Potassium 10/19/2022 3.9    Chloride 10/19/2022 100    CO2 10/19/2022 26    Calcium 10/19/2022 9.0    Total Protein 10/19/2022 6.6    Albumin 10/19/2022 4.3    Globulin, Total 10/19/2022 2.3    Albumin/Globulin Ratio 10/19/2022 1.9    Bilirubin Total 10/19/2022 0.5    Alkaline Phosphatase 10/19/2022 119  AST 10/19/2022 22    ALT 10/19/2022 32    TSH 10/19/2022 0.643    Free T4 10/19/2022 2.27 (H)    WBC 10/19/2022 9.4    RBC 10/19/2022 4.84    Hemoglobin 10/19/2022 14.7    Hematocrit 10/19/2022 44.7    MCV 10/19/2022 92    MCH 10/19/2022 30.4    MCHC 10/19/2022 32.9    RDW 10/19/2022 13.6    Platelets 10/19/2022 175   No results displayed because visit has over 200 results.    Admission on 09/11/2022, Discharged on 09/15/2022  Component Date Value   Sodium 09/11/2022 137    Potassium 09/11/2022 4.3    Chloride 09/11/2022 101    CO2 09/11/2022 28    Glucose, Bld 09/11/2022 99    BUN 09/11/2022 12    Creatinine, Ser 09/11/2022 0.95    Calcium 09/11/2022 8.8 (L)    GFR, Estimated 09/11/2022 >60    Anion gap 09/11/2022 8    WBC 09/11/2022 10.4    RBC 09/11/2022 4.77    Hemoglobin 09/11/2022 14.7    HCT 09/11/2022 44.7    MCV 09/11/2022 93.7    MCH 09/11/2022 30.8    MCHC 09/11/2022 32.9    RDW 09/11/2022 13.0    Platelets 09/11/2022 188    nRBC 09/11/2022 0.0    Troponin I (High Sensiti* 09/11/2022 5    Troponin I (High Sensiti* 09/11/2022 5    SARS Coronavirus 2 by RT* 09/11/2022 NEGATIVE    Influenza A by PCR 09/11/2022 NEGATIVE    Influenza B by PCR 09/11/2022 NEGATIVE    Resp Syncytial Virus by * 09/11/2022 NEGATIVE    B Natriuretic Peptide 09/11/2022 225.9 (H)    Weight 09/12/2022 3,088     Height 09/12/2022 69    BP 09/12/2022 109/95    S' Lateral 09/12/2022 3.58    WBC 09/12/2022 10.9 (H)    RBC 09/12/2022 4.65    Hemoglobin 09/12/2022 14.2    HCT 09/12/2022 43.7    MCV 09/12/2022 94.0    MCH 09/12/2022 30.5    MCHC 09/12/2022 32.5    RDW 09/12/2022 13.1    Platelets 09/12/2022 190    nRBC 09/12/2022 0.0    Sodium 09/12/2022 138    Potassium 09/12/2022 3.7    Chloride 09/12/2022 102    CO2 09/12/2022 26    Glucose, Bld 09/12/2022 108 (H)    BUN 09/12/2022 13    Creatinine, Ser 09/12/2022 0.95    Calcium 09/12/2022 8.5 (L)    GFR, Estimated 09/12/2022 >60    Anion gap 09/12/2022 10    Adenovirus 09/12/2022 NOT DETECTED    Coronavirus 229E 09/12/2022 NOT DETECTED    Coronavirus HKU1 09/12/2022 NOT DETECTED    Coronavirus NL63 09/12/2022 NOT DETECTED    Coronavirus OC43 09/12/2022 NOT DETECTED    Metapneumovirus 09/12/2022 NOT DETECTED    Rhinovirus / Enterovirus 09/12/2022 DETECTED (A)    Influenza A 09/12/2022 NOT DETECTED    Influenza B 09/12/2022 NOT DETECTED    Parainfluenza Virus 1 09/12/2022 NOT DETECTED    Parainfluenza Virus 2 09/12/2022 NOT DETECTED    Parainfluenza Virus 3 09/12/2022 NOT DETECTED    Parainfluenza Virus 4 09/12/2022 NOT DETECTED    Respiratory Syncytial Vi* 09/12/2022 NOT DETECTED    Bordetella pertussis 09/12/2022 NOT DETECTED    Bordetella Parapertussis 09/12/2022 NOT DETECTED    Chlamydophila pneumoniae 09/12/2022 NOT DETECTED    Mycoplasma pneumoniae 09/12/2022 NOT DETECTED    WBC 09/13/2022 12.4 (H)  RBC 09/13/2022 4.59    Hemoglobin 09/13/2022 14.1    HCT 09/13/2022 42.6    MCV 09/13/2022 92.8    MCH 09/13/2022 30.7    MCHC 09/13/2022 33.1    RDW 09/13/2022 13.0    Platelets 09/13/2022 210    nRBC 09/13/2022 0.0    Neutrophils Relative % 09/13/2022 60    Neutro Abs 09/13/2022 7.4    Lymphocytes Relative 09/13/2022 24    Lymphs Abs 09/13/2022 3.0    Monocytes Relative 09/13/2022 15    Monocytes Absolute 09/13/2022  1.8 (H)    Eosinophils Relative 09/13/2022 1    Eosinophils Absolute 09/13/2022 0.1    Basophils Relative 09/13/2022 0    Basophils Absolute 09/13/2022 0.0    Immature Granulocytes 09/13/2022 0    Abs Immature Granulocytes 09/13/2022 0.04    Sodium 09/13/2022 132 (L)    Potassium 09/13/2022 3.6    Chloride 09/13/2022 97 (L)    CO2 09/13/2022 25    Glucose, Bld 09/13/2022 118 (H)    BUN 09/13/2022 9    Creatinine, Ser 09/13/2022 0.90    Calcium 09/13/2022 8.5 (L)    Total Protein 09/13/2022 6.0 (L)    Albumin 09/13/2022 3.4 (L)    AST 09/13/2022 29    ALT 09/13/2022 35    Alkaline Phosphatase 09/13/2022 93    Total Bilirubin 09/13/2022 0.7    GFR, Estimated 09/13/2022 >60    Anion gap 09/13/2022 10    Magnesium 09/13/2022 1.7    Phosphorus 09/13/2022 4.0    Sodium 09/14/2022 131 (L)    Potassium 09/14/2022 4.3    Chloride 09/14/2022 96 (L)    CO2 09/14/2022 25    Glucose, Bld 09/14/2022 115 (H)    BUN 09/14/2022 6 (L)    Creatinine, Ser 09/14/2022 0.86    Calcium 09/14/2022 8.7 (L)    GFR, Estimated 09/14/2022 >60    Anion gap 09/14/2022 10    WBC 09/14/2022 15.8 (H)    RBC 09/14/2022 4.69    Hemoglobin 09/14/2022 14.6    HCT 09/14/2022 42.3    MCV 09/14/2022 90.2    MCH 09/14/2022 31.1    MCHC 09/14/2022 34.5    RDW 09/14/2022 12.7    Platelets 09/14/2022 201    nRBC 09/14/2022 0.0    Magnesium 09/14/2022 1.7    Phosphorus 09/14/2022 4.0    Prothrombin Time 09/14/2022 21.3 (H)    INR 09/14/2022 1.9 (H)    Sodium 09/15/2022 135    Potassium 09/15/2022 3.5    Chloride 09/15/2022 99    CO2 09/15/2022 25    Glucose, Bld 09/15/2022 96    BUN 09/15/2022 8    Creatinine, Ser 09/15/2022 1.11    Calcium 09/15/2022 8.7 (L)    GFR, Estimated 09/15/2022 >60    Anion gap 09/15/2022 11    WBC 09/15/2022 15.2 (H)    RBC 09/15/2022 4.53    Hemoglobin 09/15/2022 13.8    HCT 09/15/2022 41.9    MCV 09/15/2022 92.5    MCH 09/15/2022 30.5    MCHC 09/15/2022 32.9    RDW  09/15/2022 12.8    Platelets 09/15/2022 208    nRBC 09/15/2022 0.0    Magnesium 09/15/2022 1.7    Phosphorus 09/15/2022 3.8   There may be more visits with results that are not included.   No image results found.   CT ABDOMEN PELVIS W CONTRAST  Result Date: 03/29/2023 CLINICAL DATA:  Abdominal pain EXAM: CT ABDOMEN AND PELVIS WITH CONTRAST TECHNIQUE: Multidetector  CT imaging of the abdomen and pelvis was performed using the standard protocol following bolus administration of intravenous contrast. RADIATION DOSE REDUCTION: This exam was performed according to the departmental dose-optimization program which includes automated exposure control, adjustment of the mA and/or kV according to patient size and/or use of iterative reconstruction technique. CONTRAST:  85mL OMNIPAQUE IOHEXOL 300 MG/ML  SOLN COMPARISON:  CT abdomen and pelvis dated August 21st 2022 FINDINGS: Lower chest: Emphysema. Elevation of the right hemidiaphragm and right lower lobe atelectasis. Trace right effusion. Hepatobiliary: No focal liver abnormality is seen. Status post cholecystectomy. No biliary dilatation. Pancreas: Unremarkable. No pancreatic ductal dilatation or surrounding inflammatory changes. Spleen: Normal in size without focal abnormality. Adrenals/Urinary Tract: Bilateral adrenal glands are unremarkable. Hydronephrosis or nephrolithiasis. Simple ovarian cyst of the right kidney, no specific follow-up imaging is necessary. Bladder wall thickening. Stomach/Bowel: Stomach is within normal limits. Mild diverticulosis. No evidence of bowel wall thickening, distention, or inflammatory changes. Vascular/Lymphatic: Aortic atherosclerosis. No enlarged abdominal or pelvic lymph nodes. Reproductive: Mild Prostatomegaly. Other: No abdominal wall hernia or abnormality. No abdominopelvic ascites. Musculoskeletal: No acute or significant osseous findings. IMPRESSION: 1. No acute findings in the abdomen or pelvis. 2. Similar bladder wall  thickening, likely due to chronic outlet obstruction given associated prostatomegaly. 3. Trace right effusion. 4. Aortic Atherosclerosis (ICD10-I70.0) and Emphysema (ICD10-J43.9). Electronically Signed   By: Allegra Lai M.D.   On: 03/29/2023 19:43   EP STUDY  Result Date: 03/22/2023 SURGEON:  York Pellant, MD PREPROCEDURE DIAGNOSES: 1. Persistent atrial fibrillation POSTPROCEDURE DIAGNOSES: 1. Persistent atrial fibrillation PROCEDURES: 1. Pulmonary vein isolation (wide antral circumferential lesion set) 2. Esophageal temperature monitoring using the Circa temperature monitor 3. Three-dimensional electroanatomic mapping of atrial fibrillation 4. Intracardiac echocardiography 5. Transseptal puncture of an intact septum. 6. Comprehensive EP study INTRODUCTION:  Jesse ORONA [name] is a 62 y.o. male with a history of persistent atrial fibrillation who now presents for ablation.  DESCRIPTION OF PROCEDURE:  Informed written consent was obtained, and the patient was brought to the electrophysiology lab in a fasting state.  General anesthesia was induced and a Circa esophageal temperature probe was positioned in the esophagus. The patient was adequately sedated as outlined in the anesthesia report.  The patient's left and right groins were prepped and draped in the usual sterile fashion by the EP lab staff.  Using ultrasound guidance and modified Seldinger technique, three 8-French sheaths were positioned in the right femoral vein and one 9-French sheath was positioned in the left femoral vein. Intracardiac Echocardiography: An 8-French Bios[employer] Webster intracardiac echocardiography catheter was introduced through the left common femoral vein and advanced into the right atrium. Intracardiac echocardiography revealed no obvious LA thrombus and 4 distinct PVs.  Ablation catheter: One of the right femoral vein 8-Fr sheaths was exchanged for a vizigo sheath over a wire. The D/F curve QDOT ablation catheter was  advanced to the RA and used to obtain an electroanatomic map of the coronary sinus. Transseptal Access: Heparin was initiated and used to maintain a therapeutic ACT of 300-350 seconds. The second right femoral 8-Fr sheath was exchanged for a stearable Baylis sheath over a Baylis pigtail wire and advanced to the SVC under ultrasound guidance. Transseptal access was achieved using ICE guidance with the [facility] system. The [facility] wire was advanced into the left pulmonary veins. The ablation catheter was then advanced into the left atrium alongside the [facility] wire through the single transseptal puncture. The Baylis sheath was advanced into the left atrium. Mapping and  Ablation: An Matilde Bash was advanced to the LA and a 3-dimensional electroanatomic map was created. Fall-off points were used to mark the anterior ridge anterior to the left pulmonary veins. Ablation was then performed to isolate the pulmonary veins in a wide area circumferential ablation lesion set. QDOT+ was used on lesions along the posterior wall with 90 W of energy and QDOT was used with 35 to 50 W of energy in the other regions. Due to esophageal heating along the posterior wall adjacent to the PV's, interrupted lesions were delivered to this area to allow cooling of the esophageal tissue between lesions. After ablation was completed in the LA, the patient was cardioverted to sinus rhythm with a single 200J synchronized shock. high dose dobutamine was infused and entrance and exit block confirmed in all 4 pulmonary veins. After cardioversion: PR , QRS 95ms, QT , RR , AH 65ms, HV 40ms. A full EP study was performed while infusing dobutamine. VA wenckebach was 440. AV wenckebach was 610. Burst pacing was performed without induction of any arrhythmias. Heparin was discontinued and catheters withdrawn to the right atrium. ICE exam showed stable LV function and no change in the baseline pericardial effusion. Heparin was reversed with protamine.  Catheters were removed from the body. Sheaths were removed from the body and hemostasis achieved with perclose devices for the 2 long sheaths, and 2 Mynx grip devices for the short sheaths.. EBL<63ml. There were no early apparent complications. CONCLUSIONS: 1. Successful PVI 2. Intracardiac echo reveals no effusion 3. No early apparent complications. York Pellant, MD Cardiac Electrophysiology   CT CARDIAC MORPH/PULM VEIN W/CM&W/O CA SCORE  Addendum Date: 03/19/2023   ADDENDUM REPORT: 03/19/2023 23:29 EXAM: OVER-READ INTERPRETATION  CT CHEST The following report is an over-read performed by radiologist Dr. Noe Gens [facility] on 03/19/2023. This over-read does not include interpretation of cardiac or coronary anatomy or pathology. The coronary CTA interpretation by the cardiologist is attached. COMPARISON:  11/04/2018 FINDINGS: Heart is normal size. Aorta normal caliber. Scattered moderate calcifications in the descending thoracic aorta. No adenopathy. Mild elevation of the right hemidiaphragm. No confluent airspace opacities or effusions. No acute findings in the upper abdomen. Chest wall soft tissues are unremarkable. No acute bony abnormality. IMPRESSION: Mild elevation of the right hemidiaphragm, stable. No acute extra cardiac abnormality. Aortic atherosclerosis. Electronically Signed   By: Charlett Nose M.D.   On: 03/19/2023 23:29   Result Date: 03/19/2023 CLINICAL DATA:  Atrial fibrillation scheduled for ablation. EXAM: Cardiac CTA TECHNIQUE: A non-contrast, gated CT scan was obtained with axial slices of 3 mm through the heart for calcium scoring. Calcium scoring was performed using the Agatston method. A 120 kV retrospective, gated, contrast cardiac scan was obtained. Gantry rotation speed was 250 msecs and collimation was 0.6 mm. Nitroglycerin was not given. A delayed scan was obtained 60 seconds after contrast injection to exclude left atrial appendage thrombus. The 3D dataset was  reconstructed in 5% intervals of the 0-95% of the R-R cycle. Systolic and diastolic phases were analyzed on a dedicated workstation using MPR, MIP, and VRT modes. The patient received 80 cc of contrast. FINDINGS: Image quality: Fair Noise artifact is: Limited Pulmonary Veins: There is normal pulmonary vein drainage into the left atrium (2 on the right and 2 on the left) with ostial measurements as follows: RUPV: Ostium 20 x 17 mm  area 2.6 cm2 RLPV:  Ostium 13 x 10 mm  area 1.0 cm2 LUPV:  Ostium 20 x 15 mm area 2.3  cm2 LLPV:  Ostium 19 x 16 mm  area 2.5 cm2 Left Atrium: The left atrial size is normal. There is no PFO/ASD. There is no thrombus in the left atrial appendage on contrast or delayed imaging. The esophagus runs in the left atrial midline and is not in proximity to any of the pulmonary vein ostia. Coronary Arteries: Mild scattered calcified plaque. Unable to quantify due to contrast present in coronary arteries. Right dominance. The study was performed without use of NTG and is insufficient for plaque evaluation. Pericardium: Normal thickness with no significant effusion or calcium present. Pulmonary Artery: Normal caliber without proximal filling defect. Cardiac valves: The aortic valve is trileaflet without significant calcification. The mitral valve is normal structure without significant calcification. Aorta: Normal caliber with no significant disease. Extra-cardiac findings: See attached radiology report for non-cardiac structures. Elevated right hemidiaphragm. IMPRESSION: 1. There is normal pulmonary vein drainage into the left atrium with ostial measurements above. 2. There is no thrombus in the left atrial appendage. 3. The esophagus runs in the left atrial midline and is not in proximity to any of the pulmonary vein ostia. 4. No PFO/ASD. 5. Normal coronary origin. Right dominance. 6. Elevated right hemidiaphragm. Electronically Signed: By: Donato Schultz M.D. On: 03/15/2023 14:16       This  encounter employed real-time, collaborative documentation. The patient actively reviewed and updated their medical record on a shared screen, ensuring transparency and facilitating joint problem-solving for the problem list, overview, and plan. This approach promotes accurate, informed care. The treatment plan was discussed and reviewed in detail, including medication safety, potential side effects, and all patient questions. We confirmed understanding and comfort with the plan. Follow-up instructions were established, including contacting the office for any concerns, returning if symptoms worsen, persist, or new symptoms develop, and precautions for potential emergency department visits. ----------------------------------------------------- Lula Olszewski, MD  03/31/2023 4:55 PM  [facility]:  [phone]

## 2023-03-31 NOTE — Assessment & Plan Note (Signed)
Updated problem overview for this problem to improve longitudinal management  Need to recheck levels upon return in 2-4 weeks so that's part of why follow up then.

## 2023-03-31 NOTE — Assessment & Plan Note (Addendum)
Prediabetes: His A1c is 6.1, indicating slightly elevated average blood sugars. We encourage weight loss and exercise. AVS handout given

## 2023-03-31 NOTE — Patient Instructions (Signed)
VISIT SUMMARY:  During your visit, we discussed your recent episode of severe abdominal pain, diarrhea, and loss of appetite, which we believe was likely due to an adverse reaction to colchicine, a medication you were taking for gout. We also discussed your history of gout, hypertension, atrial fibrillation, prediabetes, and tobacco use. We made some changes to your treatment plan and discussed the importance of lifestyle changes.  YOUR PLAN:  -GASTROINTESTINAL UPSET: We believe your recent stomach issues were likely due to colchicine. We've stopped this medication and will continue with Pantoprazole. We may also add fiber to your diet to help with constipation. We'll check on this in 2 weeks.  -GOUT: You haven't had any recent gout flares. We've stopped the colchicine due to its possible side effects. If your gout flares up again, we'll consider other preventive treatments.  -HYPERTENSION: Your blood pressure was a bit high today. We're increasing the dose of your Clonidine patch and will check your blood pressure again in 2 weeks.  -ATRIAL FIBRILLATION: Since your ablation procedure, you haven't had any heart palpitations. We'll continue with your current medications and discuss possibly stopping the Amiodarone with your cardiologist at your next visit.  -PREDIABETES: Your A1c level is slightly high, indicating that your average blood sugar levels are a bit elevated. We recommend weight loss and exercise to help manage this.  -TOBACCO USE: You've been smoking for 20 years. We strongly encourage you to quit, and we'll consider another lung cancer screening.  INSTRUCTIONS:  We will follow up in 2 weeks to reassess your abdominal pain, diarrhea, and blood pressure. Please continue to take your medications as prescribed and work on lifestyle changes, such as quitting smoking and exercising more, to help manage your health conditions.It was a pleasure seeing you today! Your health and satisfaction  are our top priorities.  Glenetta Hew, MD  Your Providers PCP: Lula Olszewski, MD,  [phone]) Referring Provider: Lula Olszewski, MD,  [phone]) Care Team Provider: Reather Littler, MD,  [phone]) Care Team Provider: Marinus Maw, MD,  [phone]) Care Team Provider: Jeani Hawking, MD,  [phone]) Care Team Provider: Marinus Maw, MD,  [phone]) Care Team Provider: Maurice Small, MD,  [phone])     NEXT STEPS: [x]  Early Intervention: Schedule sooner appointment, call our on-call services, or go to emergency room if there is any significant Increase in pain or discomfort New or worsening symptoms Sudden or severe changes in your health [x]  Flexible Follow-Up: We recommend a No follow-ups on file. for optimal routine care. This allows for progress monitoring and treatment adjustments. [x]  Preventive Care: Schedule your annual preventive care visit! It's typically covered by insurance and helps identify potential health issues early. [x]  Lab & X-ray Appointments: Incomplete tests scheduled today, or call to schedule. X-rays: [facility] (M-F, 8:30am-noon or 1pm-5pm). [x]  Medical Information Release: Sign a release form at front desk to obtain relevant medical information we don't have.  MAKING THE MOST OF OUR FOCUSED 20 MINUTE APPOINTMENTS: [x]   Clearly state your top concerns at the beginning of the visit to focus our discussion [x]   If you anticipate you will need more time, please inform the front desk during scheduling - we can book multiple appointments in the same week. [x]   If you have transportation problems- use our convenient video appointments or ask about transportation support. [x]   We can get down to business faster if you use MyChart to update information before the visit and submit non-urgent questions  before your visit. Thank you for taking the time to provide details through MyChart.  Let our nurse know and she  can import this information into your encounter documents.  Arrival and Wait Times: [x]   Arriving on time ensures that everyone receives prompt attention. [x]   Early morning (8a) and afternoon (1p) appointments tend to have shortest wait times. [x]   Unfortunately, we cannot delay appointments for late arrivals or hold slots during phone calls.  Getting Answers and Following Up [x]   Simple Questions & Concerns: For quick questions or basic follow-up after your visit, reach [place] at [phone] or MyChart messaging. [x]   Complex Concerns: If your concern is more complex, scheduling an appointment might be best. Discuss this with the staff to find the most suitable option. [x]   Lab & Imaging Results: We'll contact you directly if results are abnormal or you don't use MyChart. Most normal results will be on MyChart within 2-3 business days, with a review message from Dr. Jon Billings. Haven't heard back in 2 weeks? Need results sooner? Contact us at [phone]. [x]   Referrals: Our referral coordinator will manage specialist referrals. The specialist's office should contact you within 2 weeks to schedule an appointment. Call us if you haven't heard from them after 2 weeks.  Staying Connected [x]   MyChart: Activate your MyChart for the fastest way to access results and message [place]. See the last page of this paperwork for instructions on how to activate.  Bring to Your Next Appointment [x]   Medications: Please bring all your medication bottles to your next appointment to ensure we have an accurate record of your prescriptions. [x]   Health Diaries: If you're monitoring any health conditions at home, keeping a diary of your readings can be very helpful for discussions at your next appointment.  Billing [x]   X-ray & Lab Orders: These are billed by separate companies. Contact the invoicing company directly for questions or concerns. [x]   Visit Charges: Discuss any billing inquiries with our administrative  services team.  Your Satisfaction Matters [x]   Share Your Experience: We strive for your satisfaction! If you have any complaints, or preferably compliments, please let Dr. Jon Billings know directly or contact our Practice Administrators, Edwena Felty or [employer], by asking at the front desk.   Reviewing Your Records [x]   Review this early draft of your clinical encounter notes below and the final encounter summary tomorrow on MyChart after its been completed.  All orders placed so far are visible here: Epigastric pain Assessment & Plan: Repeat abnormal urinalysis Offered repeat abnormal white blood cell(s) Improving   Paroxysmal atrial fibrillation with RVR (HCC) Assessment & Plan: Probably cured. Encouraged continuing with Xarelto. He feels better since ablation. Encouraged patient to discuss with cardiology about tapering off vs stop vs continue(s) with amiodarone.   Resistant hypertension Assessment & Plan:      Orders: -     cloNIDine; Place 1 patch (0.2 mg total) onto the skin once a week. Replaces 0.1 mg/24hr patch. To help control blood pressure.  Dispense: 4 patch; Refill: 12  Dyslipidemia  Hypokalemia  Mixed hyperlipidemia  Prediabetes Assessment & Plan: Encouraged patient to exercise   Healthcare maintenance Assessment & Plan: February 26, 2023 needs we didn't get to discuss: Eligible for Lung Cancer Screening Tobacco counseling needed Depression Monitoring Refresh HCC Diagnosis  General Health Maintenance: He should continue his current medications, including Flomax and Xarelto, and keep monitoring his blood pressure at home. He will return for a follow-up in 2 weeks  to reassess blood pressure control.        Aortic atherosclerosis (HCC)  Peripheral arterial disease (HCC)  Cigarette smoker Assessment & Plan: Smoking cessation instruction/counseling given:  counseled patient on the dangers of tobacco use, advised patient to stop smoking, and  reviewed strategies to maximize success  He reports he already has access to the therapies.   Abnormal urinalysis -     Urinalysis, Routine w reflex microscopic  Thoracic spondylosis  Diverticular disease  Obesity due to energy imbalance

## 2023-03-31 NOTE — Assessment & Plan Note (Signed)
Gout: He has had no recent flares. Colchicine was used for prophylaxis but will be discontinued due to the possible adverse reaction. We will consider alternative prophylaxis if gout flares recur.

## 2023-03-31 NOTE — Assessment & Plan Note (Addendum)
Smoking cessation instruction/counseling given:  counseled patient on the dangers of tobacco use, advised patient to stop smoking, and reviewed strategies to maximize success  He reports he already has access to the therapies.  Tobacco use: He has smoked half a pack per day for 20 years, with the last lung cancer screening in 2020. We encourage smoking cessation and will consider a repeat lung cancer screening. However, he just completed coronary CT with no findings so will hold off.

## 2023-03-31 NOTE — Assessment & Plan Note (Addendum)
  Gastrointestinal upset: He experienced a recent episode of severe abdominal pain, diarrhea, and loss of appetite, likely an adverse reaction to colchicine. Since discontinuing colchicine, his pain has improved, and the diarrhea has resolved. We will discontinue colchicine for now, continue Pantoprazole, and consider fiber supplementation for constipation. Reevaluation is planned in 2 weeks.Repeat abnormal urinalysis Offered repeat abnormal white blood cell(s) Improving

## 2023-04-01 LAB — URINALYSIS, ROUTINE W REFLEX MICROSCOPIC
Bilirubin Urine: NEGATIVE
Hgb urine dipstick: NEGATIVE
Ketones, ur: NEGATIVE
Leukocytes,Ua: NEGATIVE
Nitrite: NEGATIVE
RBC / HPF: NONE SEEN (ref 0–?)
Specific Gravity, Urine: 1.025 (ref 1.000–1.030)
Total Protein, Urine: NEGATIVE
Urine Glucose: NEGATIVE
Urobilinogen, UA: 4 — AB (ref 0.0–1.0)
pH: 6.5 (ref 5.0–8.0)

## 2023-04-04 NOTE — Progress Notes (Signed)
Please place any orders needed / requested. Arrange/confirm follow up appt(s).   Urinalysis shows:     The presence of protein and hyaline casts can be seen in various conditions, including dehydration or mild renal irritation, which might not necessarily indicate an infection.     The absence of leukocytes, nitrites, and bacteria in the UA suggests that a significant UTI is unlikely.     Symptoms have been improving with the current management (Protonix and holding colchicine), indicating that the primary issue (colchicine reaction) is resolving.  Given the improvement in symptoms and the lack of strong evidence for a UTI, it would be reasonable to monitor the patient without immediate antibiotics. However, repeating the UA could be considered to ensure that any potential infection is not missed, especially since I believe he has recent history of Foley catheterization with ablation

## 2023-04-14 ENCOUNTER — Encounter: Payer: Self-pay | Admitting: Internal Medicine

## 2023-04-14 ENCOUNTER — Ambulatory Visit (INDEPENDENT_AMBULATORY_CARE_PROVIDER_SITE_OTHER): Payer: 59 | Admitting: Internal Medicine

## 2023-04-14 VITALS — BP 102/78 | HR 71 | Temp 97.6°F | Ht 69.0 in | Wt 204.0 lb

## 2023-04-14 DIAGNOSIS — Z9189 Other specified personal risk factors, not elsewhere classified: Secondary | ICD-10-CM

## 2023-04-14 DIAGNOSIS — M1A09X0 Idiopathic chronic gout, multiple sites, without tophus (tophi): Secondary | ICD-10-CM

## 2023-04-14 DIAGNOSIS — R1013 Epigastric pain: Secondary | ICD-10-CM

## 2023-04-14 DIAGNOSIS — R809 Proteinuria, unspecified: Secondary | ICD-10-CM

## 2023-04-14 DIAGNOSIS — D72829 Elevated white blood cell count, unspecified: Secondary | ICD-10-CM | POA: Diagnosis not present

## 2023-04-14 DIAGNOSIS — R8290 Unspecified abnormal findings in urine: Secondary | ICD-10-CM | POA: Diagnosis not present

## 2023-04-14 DIAGNOSIS — I1A0 Resistant hypertension: Secondary | ICD-10-CM | POA: Diagnosis not present

## 2023-04-14 MED ORDER — METOPROLOL TARTRATE 25 MG PO TABS
25.0000 mg | ORAL_TABLET | Freq: Two times a day (BID) | ORAL | 3 refills | Status: DC
Start: 2023-04-14 — End: 2023-06-24

## 2023-04-14 NOTE — Assessment & Plan Note (Signed)
No current flare observed, previously managed with Colchicine. We will continue to withhold Colchicine due to suspected adverse effects and can use Colchicine for short periods if arthritis flares.

## 2023-04-14 NOTE — Progress Notes (Signed)
[facility]: [phone]   Routine Medical Office Visit  Patient:  Jesse STOCKHAUSEN [name]      Age: 62 y.o.       Sex:  male  Date:   04/14/2023 Patient Care Team: Lula Olszewski, MD as PCP - General (Internal Medicine) Marinus Maw, MD as PCP - Electrophysiology (Cardiology) Reather Littler, MD as Consulting Physician (Endocrinology) Marinus Maw, MD as Consulting Physician (Cardiology) Jeani Hawking, MD as Consulting Physician (Gastroenterology) Mealor, Roberts Gaudy, MD as Consulting Physician (Cardiology) Today's Healthcare Provider: Lula Olszewski, MD   Assessment and Plan:   Chidiebere was seen today for 2 week follow-up.  Abnormal urinalysis -     Urinalysis, Routine w reflex microscopic -     Basic metabolic panel  Proteinuria, unspecified type -     Urinalysis, Routine w reflex microscopic -     Basic metabolic panel  At risk for foot problem -     Ambulatory referral to Podiatry  Resistant hypertension Overview: Interim history from March 31, 2023:    Home readings: high at home and high here too. Patient reports taking current medications consistently and not experiencing any significant associated side effects or symptoms. Current hypertension medications:       Sig   cloNIDine (CATAPRES - DOSED IN MG/24 HR) 0.1 mg/24hr patch (Taking) Place 1 patch (0.1 mg total) onto the skin once a week.   losartan (COZAAR) 100 MG tablet (Taking) Take 1 tablet (100 mg total) by mouth daily.   metoprolol tartrate (LOPRESSOR) 50 MG tablet (Taking) Take 1 tablet (50 mg total) by mouth 2 (two) times daily.   spironolactone (ALDACTONE) 25 MG tablet (Taking) Take 1 tablet (25 mg total) by mouth daily.      Lab Results  Component Value Date   NA 137 03/29/2023   K 3.7 03/29/2023   CREATININE 1.05 03/29/2023   GFR 58.18 (L) 03/01/2023     Assessment & Plan: Much better on clonidine patch, but pm headache(s) and borderline low so we will cut back  metoprolol, which he likely doesn't need as much for rate-control due to recent ablation  Orders: -     Metoprolol Tartrate; Take 1 tablet (25 mg total) by mouth 2 (two) times daily.  Dispense: 180 tablet; Refill: 3  Leukocytosis, unspecified type -     CBC with Differential/Platelet  Epigastric pain Overview: Resolved with proton pump inhibitor (PPI) stomach acid reducer, discontinue colchicine  Assessment & Plan: Epigastric pain resolved with Protonix and discontinuation of Colchicine, suggesting Colchicine-induced gastritis. We will taper off Protonix if tolerated.   Idiopathic chronic gout of multiple sites without tophus Overview: Flare 02/2023 resolved with prednisone, podagra No results found for: "LABURIC" Not on allopurinol   Assessment & Plan: No current flare observed, previously managed with Colchicine. We will continue to withhold Colchicine due to suspected adverse effects and can use Colchicine for short periods if arthritis flares.      The next appointment is scheduled for June 24, 2023. We plan to administer the flu shot at the next visit.     Future Appointments  Date Time Provider Department Center  04/19/2023 11:00 AM Alphonzo Severance [place], [place] [facility] None  06/07/2023  9:20 AM Altamese [place], MD [facility] None  06/22/2023  2:45 PM Mealor, Roberts Gaudy, MD [facility]  06/24/2023  2:00 PM Lula Olszewski, MD [facility]           Clinical Presentation:  62 y.o. male who has Atrial fibrillation (paroxysmal with history of RVR; Resistant hypertension; Depression with anxiety; ED (erectile dysfunction); Hx of Bell's palsy; Amputated toe (HCC); Cigarette smoker; Bone neoplasm; PVC's (premature ventricular contractions); COPD GOLD 0/ emphysema on CT ; Vitamin D deficiency; Iron deficiency anemia; Tobacco use disorder; Mood disorder (HCC); Prolonged QT interval; Lumbar degenerative disc disease; Bladder dysfunction; Seasonal allergies; Major  depressive disorder, single episode, in remission (HCC); Gout; Hypothyroidism; Hyperlipidemia; CAD (coronary artery disease); Prediabetes; Abdominal pain; Peripheral arterial disease (HCC); Diverticular disease; Thoracic spondylosis; Abnormal urinalysis; Aortic atherosclerosis (HCC); Obesity due to energy imbalance; and Epigastric pain on their problem list. His reasons/main concerns/chief complaints for today's office visit are 2 week follow-up   AI-Extracted: Discussed the use of AI scribe software for clinical note transcription with the patient, who gave verbal consent to proceed.  History of Present Illness   The patient, with a history of hypertension, thyroid disease, heart disease, and epigastric pain, presents for a follow-up visit. He reports a recent episode of proteinuria, which was suspected to be due to colchicine use.  The patient had been experiencing epigastric pain, which has since resolved after discontinuing colchicine and starting Protonix. He reports a history of diarrhea, which was suspected to be a side effect of colchicine. The patient expresses a desire to avoid future use of colchicine due to these side effects.  The patient's blood pressure was previously elevated, but is now reported to be lower, with readings around 102/70. He is currently on clonidine, losartan, metoprolol, and spironolactone for blood pressure management. The patient reports feeling well on these medications, with no dizziness, but does mention experiencing a "funny headache" in the afternoons.  The patient also reports a history of arthritis, for which he was taking colchicine. However, he reports no current flare-ups since discontinuing the medication. He expresses a willingness to restart colchicine on a short-term basis if arthritis symptoms return, but with caution due to previous gastrointestinal side effects.  The patient also reports a foot issue, with a corn developing on a toe that sticks out  due to previous toe amputations. He has tried over-the-counter treatments with no success.  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 He expresses a desire to reduce his medication load if possible, particularly the metoprolol, due to concerns about afternoon headaches. He is also interested in potentially discontinuing Protonix, now that his epigastric pain has resolved.        He  has a past medical history of Acute hypoxemic respiratory failure (HCC) (09/18/2022), Allergy, Anxiety, Atrial fibrillation (HCC), Atrial fibrillation with RVR (HCC) (09/12/2022), Bell palsy, Depression, Diastolic heart failure, HAP (hospital-acquired pneumonia) (09/17/2022), Hypercholesterolemia, Hypertension, Hypokalemia (03/07/2023), Pneumonia, Pneumonia of right lower lobe due to infectious organism (09/17/2022), and Thyroid disease. Current Outpatient Medications on File Prior to Visit  Medication Sig   amiodarone (PACERONE) 200 MG tablet Take 1 tablet (200 mg total) by mouth daily.   carbamide peroxide (DEBROX) 6.5 % OTIC solution Place 5 drops into both ears 2 (two) times daily.   Cholecalciferol (VITAMIN D-3) 125 MCG (5000 UT) TABS Take 5,000 Units by mouth daily.   cloNIDine (CATAPRES - DOSED IN MG/24 HR) 0.1 mg/24hr patch 0.1 mg once a week.   ezetimibe (ZETIA) 10 MG tablet Take 1 tablet (10 mg total) by mouth daily.   fluticasone (FLONASE) 50 MCG/ACT nasal spray Place 2 sprays into both nostrils  daily.   isosorbide mononitrate (IMDUR) 30 MG 24 hr tablet Take 1 tablet (30 mg total) by mouth daily. Cannot take levitra with.   levothyroxine (SYNTHROID) 200 MCG tablet Take 1 tablet (200 mcg total) by mouth daily. Take as recommended by endocrine.  200 mg daily except for Sunday, take 100 mg. (Patient taking differently: Take 100-200 mcg by mouth See admin instructions. Take as recommended by  endocrine.  200 mg daily except for Sunday, take 100 mg.)   loratadine (CLARITIN) 10 MG tablet Take 1 tablet (10 mg total) by mouth daily.   losartan (COZAAR) 100 MG tablet Take 1 tablet (100 mg total) by mouth daily.   montelukast (SINGULAIR) 10 MG tablet Take 1 tablet (10 mg total) by mouth at bedtime. TAKE 1 TABLET BY MOUTH EVERYDAY AT BEDTIME Strength: 10 mg   Multiple Vitamins-Minerals (MULTIVITAMIN WITH MINERALS) tablet Take 1 tablet by mouth daily.   nicotine (NICODERM CQ - DOSED IN MG/24 HOURS) 21 mg/24hr patch Place 1 patch (21 mg total) onto the skin daily.   pantoprazole (PROTONIX) 40 MG tablet Take 1 tablet (40 mg total) by mouth daily.   rivaroxaban (XARELTO) 20 MG TABS tablet Take 1 tablet (20 mg total) by mouth daily with supper.   rosuvastatin (CRESTOR) 40 MG tablet Take 1 tablet (40 mg total) by mouth daily. Replaces 20 mg dose   Saline (SIMPLY SALINE) 0.9 % AERS Place 2 each into the nose as directed. Use nightly for sinus hygiene long-term.  Can also be used as many times daily as desired to assist with clearing congested sinuses.   spironolactone (ALDACTONE) 25 MG tablet Take 1 tablet (25 mg total) by mouth daily.   tamsulosin (FLOMAX) 0.4 MG CAPS capsule Take 1 capsule (0.4 mg total) by mouth daily.   No current facility-administered medications on file prior to visit.   Medications Discontinued During This Encounter  Medication Reason   cloNIDine (CATAPRES - DOSED IN MG/24 HR) 0.2 mg/24hr patch    metoprolol tartrate (LOPRESSOR) 50 MG tablet          Clinical Data Analysis:   Physical Exam  BP 102/78 (BP Location: Left Arm, Patient Position: Sitting)   Pulse 71   Temp 97.6 F (36.4 C) (Temporal)   Ht 5\' 9"  (1.753 m)   Wt 204 lb (92.5 kg)   SpO2 96%   BMI 30.13 kg/m  Wt Readings from Last 10 Encounters:  04/14/23 204 lb (92.5 kg)  03/31/23 205 lb (93 kg)  03/29/23 201 lb 4.5 oz (91.3 kg)  03/29/23 201 lb 3.2 oz (91.3 kg)  03/22/23 208 lb (94.3 kg)   03/17/23 204 lb 9.6 oz (92.8 kg)  03/04/23 205 lb (93 kg)  02/25/23 208 lb 9.6 oz (94.6 kg)  02/17/23 205 lb 3.2 oz (93.1 kg)  12/03/22 207 lb 12.8 oz (94.3 kg)   Vital signs reviewed.  Nursing notes reviewed. Weight trend reviewed. Abnormalities and Problem-Specific physical exam findings: no leg swelling General Appearance:  No acute distress appreciable.   Well-groomed, healthy-appearing male.  Well proportioned with no abnormal fat distribution.  Good muscle tone. Skin: Clear and well-hydrated. Pulmonary:  Normal work of breathing at rest, no respiratory distress apparent. SpO2: 96 %  Neurological:  Awake, alert, oriented, and engaged.  No obvious focal neurological deficits or cognitive impairments.  Sensorium seems unclouded.   Speech is clear and coherent with logical content. Psychiatric:  Appropriate mood, pleasant and cooperative demeanor, thoughtful and engaged during the exam  Results  Reviewed:      No results found for any visits on 04/14/23.  Office Visit on 03/31/2023  Component Date Value   Color, Urine 03/31/2023 YELLOW    APPearance 03/31/2023 CLEAR    Specific Gravity, Urine 03/31/2023 1.025    pH 03/31/2023 6.5    Total Protein, Urine 03/31/2023 NEGATIVE    Urine Glucose 03/31/2023 NEGATIVE    Ketones, ur 03/31/2023 NEGATIVE    Bilirubin Urine 03/31/2023 NEGATIVE    Hgb urine dipstick 03/31/2023 NEGATIVE    Urobilinogen, UA 03/31/2023 4.0 (A)    Leukocytes,Ua 03/31/2023 NEGATIVE    Nitrite 03/31/2023 NEGATIVE    WBC, UA 03/31/2023 3-6/hpf (A)    RBC / HPF 03/31/2023 none seen    Mucus, UA 03/31/2023 Presence of (A)    Squamous Epithelial / HPF 03/31/2023 Rare(0-4/hpf)   Admission on 03/29/2023, Discharged on 03/29/2023  Component Date Value   Lipase 03/29/2023 36    Sodium 03/29/2023 137    Potassium 03/29/2023 3.7    Chloride 03/29/2023 103    CO2 03/29/2023 27    Glucose, Bld 03/29/2023 90    BUN 03/29/2023 12    Creatinine, Ser 03/29/2023 1.05     Calcium 03/29/2023 9.0    Total Protein 03/29/2023 7.1    Albumin 03/29/2023 4.3    AST 03/29/2023 18    ALT 03/29/2023 25    Alkaline Phosphatase 03/29/2023 85    Total Bilirubin 03/29/2023 0.7    GFR, Estimated 03/29/2023 >60    Anion gap 03/29/2023 7    WBC 03/29/2023 13.2 (H)    RBC 03/29/2023 5.56    Hemoglobin 03/29/2023 16.6    HCT 03/29/2023 50.5    MCV 03/29/2023 90.8    MCH 03/29/2023 29.9    MCHC 03/29/2023 32.9    RDW 03/29/2023 15.2    Platelets 03/29/2023 235    nRBC 03/29/2023 0.0    Color, Urine 03/29/2023 YELLOW    APPearance 03/29/2023 CLEAR    Specific Gravity, Urine 03/29/2023 1.032 (H)    pH 03/29/2023 6.0    Glucose, UA 03/29/2023 NEGATIVE    Hgb urine dipstick 03/29/2023 SMALL (A)    Bilirubin Urine 03/29/2023 NEGATIVE    Ketones, ur 03/29/2023 NEGATIVE    Protein, ur 03/29/2023 30 (A)    Nitrite 03/29/2023 NEGATIVE    Leukocytes,Ua 03/29/2023 NEGATIVE    RBC / HPF 03/29/2023 0-5    WBC, UA 03/29/2023 0-5    Bacteria, UA 03/29/2023 NONE SEEN    Squamous Epithelial / HPF 03/29/2023 0-5    Mucus 03/29/2023 PRESENT    Hyaline Casts, UA 03/29/2023 PRESENT   Admission on 03/22/2023, Discharged on 03/22/2023  Component Date Value   Activated Clotting Time 03/22/2023 293    Activated Clotting Time 03/22/2023 330   Lab on 03/08/2023  Component Date Value   Glucose 03/08/2023 142 (H)    BUN 03/08/2023 12    Creatinine, Ser 03/08/2023 1.23    eGFR 03/08/2023 66    BUN/Creatinine Ratio 03/08/2023 10    Sodium 03/08/2023 141    Potassium 03/08/2023 4.0    Chloride 03/08/2023 99    CO2 03/08/2023 25    Calcium 03/08/2023 8.7    WBC 03/08/2023 10.0    RBC 03/08/2023 5.82 (H)    Hemoglobin 03/08/2023 16.9    Hematocrit 03/08/2023 53.8 (H)    MCV 03/08/2023 92    MCH 03/08/2023 29.0    MCHC 03/08/2023 31.4 (L)    RDW 03/08/2023 14.4    Platelets  03/08/2023 209   Office Visit on 03/04/2023  Component Date Value   Hemoglobin A1C 03/04/2023 6.1  (A)    POC Glucose 03/04/2023 134 (A)   Lab on 03/01/2023  Component Date Value   Free T4 03/01/2023 1.12    TSH 03/01/2023 8.05 (H)    Sodium 03/01/2023 136    Potassium 03/01/2023 3.2 (L)    Chloride 03/01/2023 94 (L)    CO2 03/01/2023 31    Glucose, Bld 03/01/2023 182 (H)    BUN 03/01/2023 14    Creatinine, Ser 03/01/2023 1.30    GFR 03/01/2023 58.18 (L)    Calcium 03/01/2023 9.0   Office Visit on 02/25/2023  Component Date Value   Sodium 02/25/2023 137    Potassium 02/25/2023 4.2    Chloride 02/25/2023 100    CO2 02/25/2023 29    Glucose, Bld 02/25/2023 119 (H)    BUN 02/25/2023 16    Creatinine, Ser 02/25/2023 1.15    GFR 02/25/2023 67.40    Calcium 02/25/2023 8.9    Aldosterone 02/25/2023 3    Renin Activity 02/25/2023 0.12 (L)    ALDO / PRA Ratio 02/25/2023 25.0    Total Volume 03/01/2023 1,500    METANEPHRINE 03/01/2023 59 (L)    NORMETANEPHRINE 03/01/2023 249    METANEPHRINES, TOTAL 03/01/2023 308   Lab on 11/30/2022  Component Date Value   Free T4 11/30/2022 1.52    TSH 11/30/2022 0.22 (L)    Sodium 11/30/2022 139    Potassium 11/30/2022 3.9    Chloride 11/30/2022 100    CO2 11/30/2022 30    Glucose, Bld 11/30/2022 138 (H)    BUN 11/30/2022 12    Creatinine, Ser 11/30/2022 1.11    GFR 11/30/2022 70.44    Calcium 11/30/2022 9.1   Lab on 10/19/2022  Component Date Value   Glucose 10/19/2022 130 (H)    BUN 10/19/2022 10    Creatinine, Ser 10/19/2022 0.83    eGFR 10/19/2022 98    BUN/Creatinine Ratio 10/19/2022 12    Sodium 10/19/2022 140    Potassium 10/19/2022 3.9    Chloride 10/19/2022 100    CO2 10/19/2022 26    Calcium 10/19/2022 9.0    Total Protein 10/19/2022 6.6    Albumin 10/19/2022 4.3    Globulin, Total 10/19/2022 2.3    Albumin/Globulin Ratio 10/19/2022 1.9    Bilirubin Total 10/19/2022 0.5    Alkaline Phosphatase 10/19/2022 119    AST 10/19/2022 22    ALT 10/19/2022 32    TSH 10/19/2022 0.643    Free T4 10/19/2022 2.27 (H)    WBC  10/19/2022 9.4    RBC 10/19/2022 4.84    Hemoglobin 10/19/2022 14.7    Hematocrit 10/19/2022 44.7    MCV 10/19/2022 92    MCH 10/19/2022 30.4    MCHC 10/19/2022 32.9    RDW 10/19/2022 13.6    Platelets 10/19/2022 175   No results displayed because visit has over 200 results.    There may be more visits with results that are not included.   No image results found.   CT ABDOMEN PELVIS W CONTRAST  Result Date: 03/29/2023 CLINICAL DATA:  Abdominal pain EXAM: CT ABDOMEN AND PELVIS WITH CONTRAST TECHNIQUE: Multidetector CT imaging of the abdomen and pelvis was performed using the standard protocol following bolus administration of intravenous contrast. RADIATION DOSE REDUCTION: This exam was performed according to the departmental dose-optimization program which includes automated exposure control, adjustment of the mA and/or kV according to patient  size and/or use of iterative reconstruction technique. CONTRAST:  85mL OMNIPAQUE IOHEXOL 300 MG/ML  SOLN COMPARISON:  CT abdomen and pelvis dated August 21st 2022 FINDINGS: Lower chest: Emphysema. Elevation of the right hemidiaphragm and right lower lobe atelectasis. Trace right effusion. Hepatobiliary: No focal liver abnormality is seen. Status post cholecystectomy. No biliary dilatation. Pancreas: Unremarkable. No pancreatic ductal dilatation or surrounding inflammatory changes. Spleen: Normal in size without focal abnormality. Adrenals/Urinary Tract: Bilateral adrenal glands are unremarkable. Hydronephrosis or nephrolithiasis. Simple ovarian cyst of the right kidney, no specific follow-up imaging is necessary. Bladder wall thickening. Stomach/Bowel: Stomach is within normal limits. Mild diverticulosis. No evidence of bowel wall thickening, distention, or inflammatory changes. Vascular/Lymphatic: Aortic atherosclerosis. No enlarged abdominal or pelvic lymph nodes. Reproductive: Mild Prostatomegaly. Other: No abdominal wall hernia or abnormality. No  abdominopelvic ascites. Musculoskeletal: No acute or significant osseous findings. IMPRESSION: 1. No acute findings in the abdomen or pelvis. 2. Similar bladder wall thickening, likely due to chronic outlet obstruction given associated prostatomegaly. 3. Trace right effusion. 4. Aortic Atherosclerosis (ICD10-I70.0) and Emphysema (ICD10-J43.9). Electronically Signed   By: Allegra Lai M.D.   On: 03/29/2023 19:43   EP STUDY  Result Date: 03/22/2023 SURGEON:  York Pellant, MD PREPROCEDURE DIAGNOSES: 1. Persistent atrial fibrillation POSTPROCEDURE DIAGNOSES: 1. Persistent atrial fibrillation PROCEDURES: 1. Pulmonary vein isolation (wide antral circumferential lesion set) 2. Esophageal temperature monitoring using the Circa temperature monitor 3. Three-dimensional electroanatomic mapping of atrial fibrillation 4. Intracardiac echocardiography 5. Transseptal puncture of an intact septum. 6. Comprehensive EP study INTRODUCTION:  Jesse FACIANE [name] is a 62 y.o. male with a history of persistent atrial fibrillation who now presents for ablation.  DESCRIPTION OF PROCEDURE:  Informed written consent was obtained, and the patient was brought to the electrophysiology lab in a fasting state.  General anesthesia was induced and a Circa esophageal temperature probe was positioned in the esophagus. The patient was adequately sedated as outlined in the anesthesia report.  The patient's left and right groins were prepped and draped in the usual sterile fashion by the EP lab staff.  Using ultrasound guidance and modified Seldinger technique, three 8-French sheaths were positioned in the right femoral vein and one 9-French sheath was positioned in the left femoral vein. Intracardiac Echocardiography: An 8-French Biosense Webster intracardiac echocardiography catheter was introduced through the left common femoral vein and advanced into the right atrium. Intracardiac echocardiography revealed no obvious LA thrombus and 4  distinct PVs.  Ablation catheter: One of the right femoral vein 8-Fr sheaths was exchanged for a vizigo sheath over a wire. The D/F curve QDOT ablation catheter was advanced to the RA and used to obtain an electroanatomic map of the coronary sinus. Transseptal Access: Heparin was initiated and used to maintain a therapeutic ACT of 300-350 seconds. The second right femoral 8-Fr sheath was exchanged for a stearable Baylis sheath over a Baylis pigtail wire and advanced to the SVC under ultrasound guidance. Transseptal access was achieved using ICE guidance with the [facility] system. The [facility] wire was advanced into the left pulmonary veins. The ablation catheter was then advanced into the left atrium alongside the [facility] wire through the single transseptal puncture. The Baylis sheath was advanced into the left atrium. Mapping and Ablation: An Matilde Bash was advanced to the LA and a 3-dimensional electroanatomic map was created. Fall-off points were used to mark the anterior ridge anterior to the left pulmonary veins. Ablation was then performed to isolate the pulmonary veins in a wide area circumferential ablation lesion  set. QDOT+ was used on lesions along the posterior wall with 90 W of energy and QDOT was used with 35 to 50 W of energy in the other regions. Due to esophageal heating along the posterior wall adjacent to the PV's, interrupted lesions were delivered to this area to allow cooling of the esophageal tissue between lesions. After ablation was completed in the LA, the patient was cardioverted to sinus rhythm with a single 200J synchronized shock. high dose dobutamine was infused and entrance and exit block confirmed in all 4 pulmonary veins. After cardioversion: PR , QRS 95ms, QT , RR , AH 65ms, HV 40ms. A full EP study was performed while infusing dobutamine. VA wenckebach was 440. AV wenckebach was 610. Burst pacing was performed without induction of any arrhythmias. Heparin was discontinued and  catheters withdrawn to the right atrium. ICE exam showed stable LV function and no change in the baseline pericardial effusion. Heparin was reversed with protamine. Catheters were removed from the body. Sheaths were removed from the body and hemostasis achieved with perclose devices for the 2 long sheaths, and 2 Mynx grip devices for the short sheaths.. EBL<38ml. There were no early apparent complications. CONCLUSIONS: 1. Successful PVI 2. Intracardiac echo reveals no effusion 3. No early apparent complications. York Pellant, MD Cardiac Electrophysiology   CT CARDIAC MORPH/PULM VEIN W/CM&W/O CA SCORE  Addendum Date: 03/19/2023   ADDENDUM REPORT: 03/19/2023 23:29 EXAM: OVER-READ INTERPRETATION  CT CHEST The following report is an over-read performed by radiologist Dr. Noe Gens [facility] on 03/19/2023. This over-read does not include interpretation of cardiac or coronary anatomy or pathology. The coronary CTA interpretation by the cardiologist is attached. COMPARISON:  11/04/2018 FINDINGS: Heart is normal size. Aorta normal caliber. Scattered moderate calcifications in the descending thoracic aorta. No adenopathy. Mild elevation of the right hemidiaphragm. No confluent airspace opacities or effusions. No acute findings in the upper abdomen. Chest wall soft tissues are unremarkable. No acute bony abnormality. IMPRESSION: Mild elevation of the right hemidiaphragm, stable. No acute extra cardiac abnormality. Aortic atherosclerosis. Electronically Signed   By: Charlett Nose M.D.   On: 03/19/2023 23:29   Result Date: 03/19/2023 CLINICAL DATA:  Atrial fibrillation scheduled for ablation. EXAM: Cardiac CTA TECHNIQUE: A non-contrast, gated CT scan was obtained with axial slices of 3 mm through the heart for calcium scoring. Calcium scoring was performed using the Agatston method. A 120 kV retrospective, gated, contrast cardiac scan was obtained. Gantry rotation speed was 250 msecs and collimation was  0.6 mm. Nitroglycerin was not given. A delayed scan was obtained 60 seconds after contrast injection to exclude left atrial appendage thrombus. The 3D dataset was reconstructed in 5% intervals of the 0-95% of the R-R cycle. Systolic and diastolic phases were analyzed on a dedicated workstation using MPR, MIP, and VRT modes. The patient received 80 cc of contrast. FINDINGS: Image quality: Fair Noise artifact is: Limited Pulmonary Veins: There is normal pulmonary vein drainage into the left atrium (2 on the right and 2 on the left) with ostial measurements as follows: RUPV: Ostium 20 x 17 mm  area 2.6 cm2 RLPV:  Ostium 13 x 10 mm  area 1.0 cm2 LUPV:  Ostium 20 x 15 mm area 2.3 cm2 LLPV:  Ostium 19 x 16 mm  area 2.5 cm2 Left Atrium: The left atrial size is normal. There is no PFO/ASD. There is no thrombus in the left atrial appendage on contrast or delayed imaging. The esophagus runs in the left atrial midline  and is not in proximity to any of the pulmonary vein ostia. Coronary Arteries: Mild scattered calcified plaque. Unable to quantify due to contrast present in coronary arteries. Right dominance. The study was performed without use of NTG and is insufficient for plaque evaluation. Pericardium: Normal thickness with no significant effusion or calcium present. Pulmonary Artery: Normal caliber without proximal filling defect. Cardiac valves: The aortic valve is trileaflet without significant calcification. The mitral valve is normal structure without significant calcification. Aorta: Normal caliber with no significant disease. Extra-cardiac findings: See attached radiology report for non-cardiac structures. Elevated right hemidiaphragm. IMPRESSION: 1. There is normal pulmonary vein drainage into the left atrium with ostial measurements above. 2. There is no thrombus in the left atrial appendage. 3. The esophagus runs in the left atrial midline and is not in proximity to any of the pulmonary vein ostia. 4. No PFO/ASD.  5. Normal coronary origin. Right dominance. 6. Elevated right hemidiaphragm. Electronically Signed: By: Donato Schultz M.D. On: 03/15/2023 14:16    CT ABDOMEN PELVIS W CONTRAST  Result Date: 03/29/2023 CLINICAL DATA:  Abdominal pain EXAM: CT ABDOMEN AND PELVIS WITH CONTRAST TECHNIQUE: Multidetector CT imaging of the abdomen and pelvis was performed using the standard protocol following bolus administration of intravenous contrast. RADIATION DOSE REDUCTION: This exam was performed according to the departmental dose-optimization program which includes automated exposure control, adjustment of the mA and/or kV according to patient size and/or use of iterative reconstruction technique. CONTRAST:  85mL OMNIPAQUE IOHEXOL 300 MG/ML  SOLN COMPARISON:  CT abdomen and pelvis dated August 21st 2022 FINDINGS: Lower chest: Emphysema. Elevation of the right hemidiaphragm and right lower lobe atelectasis. Trace right effusion. Hepatobiliary: No focal liver abnormality is seen. Status post cholecystectomy. No biliary dilatation. Pancreas: Unremarkable. No pancreatic ductal dilatation or surrounding inflammatory changes. Spleen: Normal in size without focal abnormality. Adrenals/Urinary Tract: Bilateral adrenal glands are unremarkable. Hydronephrosis or nephrolithiasis. Simple ovarian cyst of the right kidney, no specific follow-up imaging is necessary. Bladder wall thickening. Stomach/Bowel: Stomach is within normal limits. Mild diverticulosis. No evidence of bowel wall thickening, distention, or inflammatory changes. Vascular/Lymphatic: Aortic atherosclerosis. No enlarged abdominal or pelvic lymph nodes. Reproductive: Mild Prostatomegaly. Other: No abdominal wall hernia or abnormality. No abdominopelvic ascites. Musculoskeletal: No acute or significant osseous findings. IMPRESSION: 1. No acute findings in the abdomen or pelvis. 2. Similar bladder wall thickening, likely due to chronic outlet obstruction given associated  prostatomegaly. 3. Trace right effusion. 4. Aortic Atherosclerosis (ICD10-I70.0) and Emphysema (ICD10-J43.9). Electronically Signed   By: Allegra Lai M.D.   On: 03/29/2023 19:43      This encounter employed real-time, collaborative documentation. The patient actively reviewed and updated their medical record on a shared screen, ensuring transparency and facilitating joint problem-solving for the problem list, overview, and plan. This approach promotes accurate, informed care. The treatment plan was discussed and reviewed in detail, including medication safety, potential side effects, and all patient questions. We confirmed understanding and comfort with the plan. Follow-up instructions were established, including contacting the office for any concerns, returning if symptoms worsen, persist, or new symptoms develop, and precautions for potential emergency department visits. ----------------------------------------------------- Lula Olszewski, MD  04/14/2023 7:57 PM  [facility]:  [phone]

## 2023-04-14 NOTE — Patient Instructions (Signed)
It was a pleasure seeing you today! Your health and satisfaction are our top priorities.  Glenetta Hew, MD  VISIT SUMMARY:  During your recent visit, we discussed your history of hypertension, thyroid disease, heart disease, and epigastric pain. We also talked about your recent episode of proteinuria, which we suspect might be due to your previous use of colchicine. We noted that your epigastric pain has resolved after discontinuing colchicine and starting Protonix. Your blood pressure is now lower, and you're feeling well on your current medications. However, you mentioned experiencing a 'funny headache' in the afternoons. We also discussed your history of arthritis and your foot issue.  YOUR PLAN:  -EPIGASTRIC PAIN: Your stomach pain has improved after stopping colchicine and starting Protonix, a medication that reduces stomach acid. We will slowly stop Protonix if you continue to feel better.  -PROTEINURIA: Proteinuria means there is an abnormal amount of protein in your urine, which could be due to colchicine. We will repeat a urine test to check this.  -HYPERTENSION: Your blood pressure is well controlled, but possibly too low with your current medications. We will reduce the dose of Metoprolol, a blood pressure medication, and ask you to monitor your blood pressure at home and report any dizziness or headaches.  -ARTHRITIS: You currently have no arthritis flare-ups. We will continue to avoid colchicine due to its side effects and only use it for short periods if your arthritis symptoms return.  -FOOT CORN: The corn on your foot is causing discomfort and difficulty walking. We will refer you to a foot specialist (podiatrist) for evaluation and treatment.  INSTRUCTIONS:  Please continue to monitor your blood pressure at home and report any dizziness or headaches. We will reduce the dose of your Metoprolol medication. We will also repeat a urine test to check for protein. We have referred you  to a podiatrist for your foot corn. Your next appointment is scheduled for June 24, 2023, during which we plan to give you a flu shot.    NEXT STEPS: [x]  Early Intervention: Schedule sooner appointment, call our on-call services, or go to emergency room if there is any significant Increase in pain or discomfort New or worsening symptoms Sudden or severe changes in your health [x]  Flexible Follow-Up: come sooner if you need [place] [x]  Preventive Care: Schedule your annual preventive care visit! It's typically covered by insurance and helps identify potential health issues early. [x]  Lab & X-ray Appointments: Incomplete tests scheduled today, or call to schedule. X-rays: [facility] (M-F, 8:30am-noon or 1pm-5pm). [x]  Medical Information Release: Sign a release form at front desk to obtain relevant medical information we don't have.  MAKING THE MOST OF OUR FOCUSED 20 MINUTE APPOINTMENTS: [x]   Clearly state your top concerns at the beginning of the visit to focus our discussion [x]   If you anticipate you will need more time, please inform the front desk during scheduling - we can book multiple appointments in the same week. [x]   If you have transportation problems- use our convenient video appointments or ask about transportation support. [x]   We can get down to business faster if you use MyChart to update information before the visit and submit non-urgent questions before your visit. Thank you for taking the time to provide details through MyChart.  Let our nurse know and she can import this information into your encounter documents.  Arrival and Wait Times: [x]   Arriving on time ensures that everyone receives prompt attention. [x]   Early morning (8a)  and afternoon (1p) appointments tend to have shortest wait times. [x]   Unfortunately, we cannot delay appointments for late arrivals or hold slots during phone calls.  Getting Answers and Following Up [x]   Simple Questions &  Concerns: For quick questions or basic follow-up after your visit, reach [place] at [phone] or MyChart messaging. [x]   Complex Concerns: If your concern is more complex, scheduling an appointment might be best. Discuss this with the staff to find the most suitable option. [x]   Lab & Imaging Results: We'll contact you directly if results are abnormal or you don't use MyChart. Most normal results will be on MyChart within 2-3 business days, with a review message from Dr. Jon Billings. Haven't heard back in 2 weeks? Need results sooner? Contact us at [phone]. [x]   Referrals: Our referral coordinator will manage specialist referrals. The specialist's office should contact you within 2 weeks to schedule an appointment. Call us if you haven't heard from them after 2 weeks.  Staying Connected [x]   MyChart: Activate your MyChart for the fastest way to access results and message [place]. See the last page of this paperwork for instructions on how to activate.  Bring to Your Next Appointment [x]   Medications: Please bring all your medication bottles to your next appointment to ensure we have an accurate record of your prescriptions. [x]   Health Diaries: If you're monitoring any health conditions at home, keeping a diary of your readings can be very helpful for discussions at your next appointment.  Billing [x]   X-ray & Lab Orders: These are billed by separate companies. Contact the invoicing company directly for questions or concerns. [x]   Visit Charges: Discuss any billing inquiries with our administrative services team.  Your Satisfaction Matters [x]   Share Your Experience: We strive for your satisfaction! If you have any complaints, or preferably compliments, please let Dr. Jon Billings know directly or contact our Practice Administrators, Edwena Felty or [employer], by asking at the front desk.   Reviewing Your Records [x]   Review this early draft of your clinical encounter notes below and the final  encounter summary tomorrow on MyChart after its been completed.  All orders placed so far are visible here: Abnormal urinalysis -     Urinalysis, Routine w reflex microscopic -     Basic metabolic panel  Proteinuria, unspecified type -     Urinalysis, Routine w reflex microscopic -     Basic metabolic panel  At risk for foot problem -     Ambulatory referral to Podiatry  Resistant hypertension Assessment & Plan: Much better on clonidine patch, but pm headache(s) and borderline low so we will cut back metoprolol, which he likely doesn't need as much for rate-control due to recent ablation  Orders: -     Metoprolol Tartrate; Take 1 tablet (25 mg total) by mouth 2 (two) times daily.  Dispense: 180 tablet; Refill: 3  Leukocytosis, unspecified type -     CBC with Differential/Platelet  Epigastric pain Assessment & Plan: Epigastric pain resolved with Protonix and discontinuation of Colchicine, suggesting Colchicine-induced gastritis. We will taper off Protonix if tolerated.   Idiopathic chronic gout of multiple sites without tophus Assessment & Plan: No current flare observed, previously managed with Colchicine. We will continue to withhold Colchicine due to suspected adverse effects and can use Colchicine for short periods if arthritis flares.

## 2023-04-14 NOTE — Assessment & Plan Note (Signed)
Epigastric pain resolved with Protonix and discontinuation of Colchicine, suggesting Colchicine-induced gastritis. We will taper off Protonix if tolerated.

## 2023-04-14 NOTE — Assessment & Plan Note (Deleted)
Much better on clonidine patch, but pm headache(s) and borderline low so we will cut back metoprolol, which he likely doesn't need as much for rate-control due to recent ablation

## 2023-04-16 ENCOUNTER — Telehealth: Payer: Self-pay | Admitting: Internal Medicine

## 2023-04-16 ENCOUNTER — Encounter: Payer: Self-pay | Admitting: Internal Medicine

## 2023-04-16 ENCOUNTER — Other Ambulatory Visit: Payer: Self-pay | Admitting: Internal Medicine

## 2023-04-16 DIAGNOSIS — N1831 Chronic kidney disease, stage 3a: Secondary | ICD-10-CM | POA: Insufficient documentation

## 2023-04-16 DIAGNOSIS — R9431 Abnormal electrocardiogram [ECG] [EKG]: Secondary | ICD-10-CM

## 2023-04-16 DIAGNOSIS — N179 Acute kidney failure, unspecified: Secondary | ICD-10-CM

## 2023-04-16 DIAGNOSIS — F17210 Nicotine dependence, cigarettes, uncomplicated: Secondary | ICD-10-CM

## 2023-04-16 DIAGNOSIS — N182 Chronic kidney disease, stage 2 (mild): Secondary | ICD-10-CM

## 2023-04-16 DIAGNOSIS — R319 Hematuria, unspecified: Secondary | ICD-10-CM | POA: Insufficient documentation

## 2023-04-16 DIAGNOSIS — R3121 Asymptomatic microscopic hematuria: Secondary | ICD-10-CM

## 2023-04-16 DIAGNOSIS — R809 Proteinuria, unspecified: Secondary | ICD-10-CM

## 2023-04-16 NOTE — Telephone Encounter (Signed)
Left vm for patient to call the office back to discuss this.

## 2023-04-16 NOTE — Telephone Encounter (Signed)
Spoke went and went over lab results and recommendations per Dr. Jon Billings, which was the reason for the [place] and referrals.

## 2023-04-16 NOTE — Progress Notes (Signed)
Please contact the patient if they haven't viewed their MyChart message within 1 week. Explain the presence of blood in the urine and the slightly decreased kidney function (need to avoid NSAIDs, stay hydrated). Confirm they understand the need for follow-up with urology and nephrology. Schedule the renal ultrasound and specialist appointments if not already done. Remind them to continue all current medications and to report any new urinary symptoms. I recommend follow up in 1-3 weeks to recheck kidneys.  I went ahead and ordered urinalysis repeat as well.

## 2023-04-16 NOTE — Telephone Encounter (Signed)
Caller states he was called in regards to scheduling for renal US. States he is confused on why this was ordered and wanted some clarification. Patient requests to be called back.

## 2023-04-19 ENCOUNTER — Ambulatory Visit (HOSPITAL_COMMUNITY)
Admission: RE | Admit: 2023-04-19 | Discharge: 2023-04-19 | Disposition: A | Discharge status: Home / Self Care | Payer: 59 | Source: Ambulatory Visit | Attending: Physician Assistant | Admitting: Physician Assistant

## 2023-04-19 ENCOUNTER — Encounter (HOSPITAL_COMMUNITY): Payer: Self-pay | Admitting: Physician Assistant

## 2023-04-19 VITALS — BP 124/84 | HR 63 | Ht 69.0 in | Wt 206.9 lb

## 2023-04-19 DIAGNOSIS — I4819 Other persistent atrial fibrillation: Secondary | ICD-10-CM

## 2023-04-19 DIAGNOSIS — Z79899 Other long term (current) drug therapy: Secondary | ICD-10-CM | POA: Diagnosis not present

## 2023-04-19 DIAGNOSIS — I110 Hypertensive heart disease with heart failure: Secondary | ICD-10-CM | POA: Insufficient documentation

## 2023-04-19 DIAGNOSIS — D6869 Other thrombophilia: Secondary | ICD-10-CM | POA: Diagnosis not present

## 2023-04-19 DIAGNOSIS — Z7901 Long term (current) use of anticoagulants: Secondary | ICD-10-CM | POA: Diagnosis not present

## 2023-04-19 DIAGNOSIS — Z5181 Encounter for therapeutic drug level monitoring: Secondary | ICD-10-CM

## 2023-04-19 MED ORDER — AMIODARONE HCL 200 MG PO TABS: 200.0000 mg | ORAL_TABLET | Freq: Every day | ORAL | 3 refills | Status: DC

## 2023-04-19 NOTE — Patient Instructions (Addendum)
Amiodarone 200mg once a day    

## 2023-04-19 NOTE — Progress Notes (Signed)
Primary Care Physician: Jesse Olszewski, MD Primary Cardiologist: None Electrophysiologist: Jesse Bunting, MD  Referring Physician: Dr Jesse Suarez [name] is a 62 y.o. male with a history of HLD, HTN, CHF, atrial fibrillation who presents for follow up in the [facility]. He was diagnosed with AF years ago in the setting of thyrotoxicosis. Despite having this thyroid disease under control, he had recurrence of AF and was placed on amiodarone. He had a DCCV on 10/22/22 but had quick return of afib. He was seen by Jesse Suarez and underwent afib ablation on 03/22/23. Patient is on Xarelto for a CHADS2VASC score of 2.  On follow up today, he reports that he has done well since the ablation. He did have severe diarrhea post ablation, suspected due to the colchicine, now resolved. He denies any significant chest pain, swallowing pain, or groin issues. He remains in [name] He has been taking amiodarone 200 mg BID.   Today, he denies symptoms of palpitations, chest pain, shortness of breath, orthopnea, PND, lower extremity edema, dizziness, presyncope, syncope, snoring, daytime somnolence, bleeding, or neurologic sequela. The patient is tolerating medications without difficulties and is otherwise without complaint today.    Atrial Fibrillation Risk Factors:  he does not have symptoms or diagnosis of sleep apnea. he does not have a history of rheumatic fever.   Atrial Fibrillation Management history:  Previous antiarrhythmic drugs: amiodarone  Previous cardioversions: 09/15/22, 10/22/22 Previous ablations: 03/22/23 Anticoagulation history: Xarelto   ROS- All systems are reviewed and negative except as per the HPI above.   Physical Exam: BP 124/84   Pulse 63   Ht 5\' 9"  (1.753 m)   Wt 93.8 kg   BMI 30.55 kg/m   GEN: Well nourished, well developed in no acute distress NECK: No JVD; No carotid bruits CARDIAC: Regular rate and rhythm, no murmurs, rubs,  gallops RESPIRATORY:  Clear to auscultation without rales, wheezing or rhonchi  ABDOMEN: Soft, non-tender, non-distended EXTREMITIES:  No edema; No deformity   Wt Readings from Last 3 Encounters:  04/19/23 93.8 kg  04/14/23 92.5 kg  03/31/23 93 kg     EKG today demonstrates  SR Vent. rate 63 BPM PR interval 142 ms QRS duration 90 ms QT/QTcB 484/495 ms  Echo 09/12/22 demonstrated   1. Left ventricular ejection fraction, by estimation, is 40 to 45%. The  left ventricle has mildly decreased function. The left ventricle  demonstrates global hypokinesis. There is mild concentric left ventricular  hypertrophy. Left ventricular diastolic function could not be evaluated.   2. Right ventricular systolic function is normal. The right ventricular  size is mildly enlarged. There is normal pulmonary artery systolic  pressure. The estimated right ventricular systolic pressure is 21.3 mmHg.   3. Left atrial size was moderately dilated.   4. Right atrial size was moderately dilated.   5. The mitral valve is normal in structure. Mild mitral valve  regurgitation.   6. The aortic valve is tricuspid. Aortic valve regurgitation is not  visualized. No aortic stenosis is present.   7. The inferior vena cava is normal in size with greater than 50%  respiratory variability, suggesting right atrial pressure of 3 mmHg.    CHA2DS2-VASc Score = 2  The patient's score is based upon: CHF History: 1 HTN History: 1 Diabetes History: 0 Stroke History: 0 Vascular Disease History: 0 Age Score: 0 Gender Score: 0       ASSESSMENT AND PLAN: Persistent Atrial Fibrillation (  ICD10:  I48.19) The patient's CHA2DS2-VASc score is 2, indicating a 2.2% annual risk of stroke.   S/p afib ablation 03/22/23 Patient appears to be maintaining SR Decrease amiodarone to 200 mg daily Continue Lopressor 25 mg BID Continue Xarelto 20 mg daily with no missed doses for 3 months post ablation.   Secondary Hypercoagulable  State (ICD10:  D68.69) The patient is at significant risk for stroke/thromboembolism based upon his CHA2DS2-VASc Score of 2.  Continue Rivaroxaban (Xarelto).   HTN Stable on current regimen  HFmrEF EF 40-45% Fluid status appears stable    Follow up with Jesse Suarez as scheduled.    Jesse Loa PA-C [facility] [address] [place], [place] [address] [phone]

## 2023-04-22 ENCOUNTER — Other Ambulatory Visit: Payer: 59

## 2023-04-27 ENCOUNTER — Ambulatory Visit
Admission: RE | Admit: 2023-04-27 | Discharge: 2023-04-27 | Disposition: A | Discharge status: Home / Self Care | Payer: 59 | Source: Ambulatory Visit | Attending: Internal Medicine | Admitting: Internal Medicine

## 2023-04-27 DIAGNOSIS — N182 Chronic kidney disease, stage 2 (mild): Secondary | ICD-10-CM

## 2023-04-27 DIAGNOSIS — R3121 Asymptomatic microscopic hematuria: Secondary | ICD-10-CM

## 2023-04-27 DIAGNOSIS — F17210 Nicotine dependence, cigarettes, uncomplicated: Secondary | ICD-10-CM

## 2023-04-27 DIAGNOSIS — R809 Proteinuria, unspecified: Secondary | ICD-10-CM

## 2023-04-27 DIAGNOSIS — N179 Acute kidney failure, unspecified: Secondary | ICD-10-CM

## 2023-05-03 ENCOUNTER — Ambulatory Visit (INDEPENDENT_AMBULATORY_CARE_PROVIDER_SITE_OTHER): Payer: 59 | Admitting: Podiatry

## 2023-05-03 ENCOUNTER — Telehealth: Payer: Self-pay | Admitting: Internal Medicine

## 2023-05-03 ENCOUNTER — Encounter: Payer: Self-pay | Admitting: Podiatry

## 2023-05-03 DIAGNOSIS — B351 Tinea unguium: Secondary | ICD-10-CM

## 2023-05-03 DIAGNOSIS — I4819 Other persistent atrial fibrillation: Secondary | ICD-10-CM

## 2023-05-03 DIAGNOSIS — L97522 Non-pressure chronic ulcer of other part of left foot with fat layer exposed: Secondary | ICD-10-CM

## 2023-05-03 DIAGNOSIS — Z89422 Acquired absence of other left toe(s): Secondary | ICD-10-CM | POA: Diagnosis not present

## 2023-05-03 DIAGNOSIS — M79675 Pain in left toe(s): Secondary | ICD-10-CM | POA: Diagnosis not present

## 2023-05-03 DIAGNOSIS — L84 Corns and callosities: Secondary | ICD-10-CM | POA: Diagnosis not present

## 2023-05-03 DIAGNOSIS — M79674 Pain in right toe(s): Secondary | ICD-10-CM

## 2023-05-03 DIAGNOSIS — D6869 Other thrombophilia: Secondary | ICD-10-CM

## 2023-05-03 NOTE — Progress Notes (Signed)
Subjective:  Patient ID: Jesse STADNIK [name], male    DOB: 01-13-1961,   MRN: [id number]  Chief Complaint  Patient presents with   Callouses    62 y.o. male presents for concern of calluses on his left foot as well as concern of thickened elongated and painful nails that are difficult to trim. Requesting to have them trimmed today. He is on blood thinner and at risk for foot care. Relates the left third toe has been painful. 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   PCP:  Lula Olszewski, MD    . Denies any other pedal complaints. Denies n/v/f/c.   Past Medical History:  Diagnosis Date   Acute hypoxemic respiratory failure (HCC) 09/18/2022   Allergy    Anxiety    Atrial fibrillation (HCC)    Atrial fibrillation with RVR (HCC) 09/12/2022   Bell palsy    Depression    Diastolic heart failure    HAP (hospital-acquired pneumonia) 09/17/2022   Hypercholesterolemia    Hypertension    Hypokalemia 03/07/2023      moderate (2.8 - 3.5 meq/dl)  Recent started spironolactone          Lab Results      Component    Value    Date/Time           K    3.7    03/29/2023 05:26 PM           K    4.0    03/08/2023 11:04 AM           K    3.2 (L)    03/01/2023 10:09 AM           K    4.2    02/25/2023 03:37 PM           K    3.9    11/30/2022 10:29 AM           K    3.9    10/19/2022 10:03 AM           K    4.3    01   Pneumonia    Pneumonia of right lower lobe due to infectious organism 09/17/2022   Thyroid disease     Objective:  Physical Exam: Vascular: DP/PT pulses 2/4 bilateral. CFT <3 seconds. Normal hair growth on digits. No edema.  Skin. No lacerations or abrasions bilateral feet. Nails 1-5 on right and 3-5 on left are thickened and elongated. Ulceration underlying callus on third digit about 0.2 cm with granular base. No erythema edema or purulence noted.  No probe to bone. Hyperkeratotic lesion noted to residual left hallux.   Musculoskeletal: MMT 5/5 bilateral lower extremities in DF, PF, Inversion and Eversion. Deceased ROM in DF of ankle joint. Previous amptuation of distal left hallux second and third digits.  Neurological: Sensation intact to light touch.   Assessment:   1. Hypercoagulable state due to persistent atrial fibrillation (HCC)   2. Callus of foot   3. History of amputation of lesser toe, left (HCC)      Plan:  Patient was evaluated and treated and all questions answered.  -Mechanically debrided all nails 1-5 bilateral using sterile nail nipper and filed with dremel without incident  -Answered all patient questions -Hyperkeratotic tissue debrided on tip of left third and first digit upon debridement of third digit wound waqs noted underlying the callus   Ulcer left third digit  with fat layer exposed  -Debridement as below. -Dressed with betadine , DSD. -Off-loading with surgical shoe. -No abx indicated.  -Discussed glucose control and proper protein-rich diet.  -Discussed if any worsening redness, pain, fever or chills to call or may need to report to the emergency room. Patient expressed understanding.   Procedure: Excisional Debridement of Wound Rationale: Removal of non-viable soft tissue from the wound to promote healing.  Anesthesia: none Pre-Debridement Wound Measurements: Overlying callus   Post-Debridement Wound Measurements: 0.2 cm x 0.2 cm x 0.2 cm  Type of Debridement: Sharp Excisional Tissue Removed: Non-viable soft tissue Depth of Debridement: subcutaneous tissue. Technique: Sharp excisional debridement to bleeding, viable wound base.  Dressing: Dry, sterile, compression dressing. Disposition: Patient tolerated procedure well. Patient to return in 2 week for follow-up.  No follow-ups on file.    Louann Sjogren, DPM

## 2023-05-03 NOTE — Telephone Encounter (Signed)
Advised to see provider within 24 hrs  Patient Name First: Maisie Fus Last: [id number] Gender: Unknown DOB: 13-Nov-1960 Age: 62 Y 4 M 19 D Return Phone Number: [phone] (Primary) Address: City/ State/ Zip: [place]  [address] Client [facility] - Human resources officer [facility] [employer] Provider Glenetta Hew- MD Contact Type Call Who Is Calling Patient / Member / Family / Caregiver Call Type Triage / Clinical Relationship To Patient Self Return Phone Number [phone] (Primary) Chief Complaint Anxiety and Panic Attack Reason for Call Symptomatic / Request for Health Information Initial Comment Caller states his wife died and is requesting a nerve pill. Translation No Nurse Assessment Nurse: Jayme Cloud, RN, [place] Date/Time (Eastern Time): 05/02/2023 3:56:03 PM Confirm and document reason for call. If symptomatic, describe symptoms. ---Caller states requesting "nerve pill", reports wife currently in the process of passing away. Reports anxiety. Does the patient have any new or worsening symptoms? ---Yes Will a triage be completed? ---Yes Related visit to physician within the last 2 weeks? ---No Does the PT have any chronic conditions? (i.e. diabetes, asthma, this includes High risk factors for pregnancy, etc.) ---Yes List chronic conditions. ---Afib Is this a behavioral health or substance abuse call? ---Yes Are you having any thoughts or feelings of harming or killing yourself or someone else? ---No Are you currently experiencing any physical discomfort that you think may be related to the use of alcohol or other drugs? (use substance abuse or alcohol abuse guidelines. These include withdrawal symptoms) ---No Do you worry that you may be hearing or seeing things that others do not? ---No Do you take medications for your condition(s)? ---No Guidelines Guideline Title Affirmed Question Affirmed Notes Nurse  Date/Time Lamount Cohen Time) Anxiety and Panic Attack Patient sounds very upset or troubled to the triager Jayme Cloud, RN, [facility] 05/02/2023 3:59:31 PM Disp. Time Lamount Cohen Time) Disposition Final User 05/02/2023 4:03:15 PM See PCP within 24 Hours Yes Jayme Cloud RN, [place] Final Disposition 05/02/2023 4:03:15 PM See PCP within 24 Hours Yes Jayme Cloud, RN, [place] Disagree/Comply Comply Caller Understands Yes PreDisposition Did not know what to do Care Advice Given Per Guideline SEE PCP WITHIN 24 HOURS: * IF OFFICE WILL BE OPEN: You need to be examined within the next 24 hours. Call your doctor (or NP/PA) when the office opens and make an appointment. CALL BACK IF: * You become worse CARE ADVICE given per Anxiety and Panic Attack (Adult) guideline. Comments User: Laurene Footman, RN Date/Time Lamount Cohen Time): 05/02/2023 3:55:22 PM per clinic directives "Do not call for Meds after-hours." Referrals REFERRED TO PCP OFFICE

## 2023-05-03 NOTE — Telephone Encounter (Signed)
Patient called in requesting if pcp could send in something to help him sleep. States his wife is currently in line to go into hospice. States they took her to the hospital this weekend where they estimated she would have about two more weeks to live.

## 2023-05-06 NOTE — Telephone Encounter (Signed)
PLEASE CALL PT BACK ASAP

## 2023-05-07 ENCOUNTER — Encounter: Payer: Self-pay | Admitting: Internal Medicine

## 2023-05-07 DIAGNOSIS — N400 Enlarged prostate without lower urinary tract symptoms: Secondary | ICD-10-CM | POA: Insufficient documentation

## 2023-05-07 NOTE — Progress Notes (Signed)
Patient: 62 year old male with recent hematuria, proteinuria, and acute kidney injury  Renal Ultrasound Results (05/03/2023): - Both kidneys normal size and texture - Simple cysts: Right 3.1 cm, Left 1.9 cm (benign, no follow-up needed) - No hydronephrosis - Prostate protruding into bladder base  Analysis: Results are reassuring but don't fully explain recent symptoms. Cysts and prostate findings are likely incidental and age-appropriate. No obstruction or masses seen to account for hematuria or AKI.  Plan: 1. Proceed with scheduled Urology and Nephrology referrals 2. Repeat kidney function tests in 4-6 weeks 3. Continue current medications 4. Patient to monitor urine color and report changes  Phone Follow-up: - Explain results in simple terms, emphasizing that while not alarming, we still need to investigate further - Remind about upcoming specialist appointments - Instruct on urine monitoring and when to call with concerns - Verify understanding of current medication regimen - Address any questions or concerns  Thank you for your help in following up with this patient. Your role in explaining these results and reinforcing our plan is crucial for his care.

## 2023-05-10 NOTE — Telephone Encounter (Signed)
Patient states he lost his wife over the weekend and he is wanting to know if pcp could send something in.

## 2023-05-11 ENCOUNTER — Other Ambulatory Visit: Payer: Self-pay

## 2023-05-11 DIAGNOSIS — G4700 Insomnia, unspecified: Secondary | ICD-10-CM

## 2023-05-11 MED ORDER — HYDROXYZINE HCL 25 MG PO TABS: 25.0000 mg | ORAL_TABLET | Freq: Three times a day (TID) | ORAL | 0 refills | Status: DC | PRN

## 2023-05-11 NOTE — Telephone Encounter (Signed)
Left vm for patient to call the office back. I did not send the medications for sleep in yet, as I would like to speak with the patient first.

## 2023-05-11 NOTE — Telephone Encounter (Signed)
Spoke with patient, he declined the grief counseling and support groups and said that he will call the office back to try to schedule an appointment next week possibly. Sent in atarax 25 mg but not the mirtazapine as there was a contraindication with the amiodarone that he is currently taking. He stated that he is doing pretty good and sounded as if he was okay.

## 2023-05-17 ENCOUNTER — Emergency Department (HOSPITAL_BASED_OUTPATIENT_CLINIC_OR_DEPARTMENT_OTHER)
Admission: EM | Admit: 2023-05-17 | Discharge: 2023-05-17 | Disposition: A | Discharge status: Home / Self Care | Payer: 59 | Attending: Emergency Medicine | Admitting: Emergency Medicine

## 2023-05-17 ENCOUNTER — Encounter (HOSPITAL_BASED_OUTPATIENT_CLINIC_OR_DEPARTMENT_OTHER): Payer: Self-pay

## 2023-05-17 ENCOUNTER — Other Ambulatory Visit: Payer: Self-pay

## 2023-05-17 DIAGNOSIS — R220 Localized swelling, mass and lump, head: Secondary | ICD-10-CM | POA: Diagnosis present

## 2023-05-17 MED ORDER — DOXYCYCLINE HYCLATE 100 MG PO TABS
100.0000 mg | ORAL_TABLET | Freq: Once | ORAL | Status: AC
  Administered 2023-05-17: 100 mg via ORAL
  Filled 2023-05-17: qty 1

## 2023-05-17 MED ORDER — DOXYCYCLINE HYCLATE 100 MG PO CAPS: 100.0000 mg | ORAL_CAPSULE | Freq: Two times a day (BID) | ORAL | 0 refills | Status: DC

## 2023-05-17 NOTE — ED Triage Notes (Addendum)
Facial swelling x2 days. Patent airway. 125mg  solumedrol at [facility].

## 2023-05-17 NOTE — Discharge Instructions (Signed)
Im not sure what is causing your facial swelling.  I am starting you on an antibiotic to try and help you with this in case it is a skin infection.  Please return for worsening swelling difficulty breathing or swallowing.  Please follow-up with family doctor in the office.

## 2023-05-17 NOTE — ED Provider Notes (Signed)
[facility] Provider Note   CSN: [id number] Arrival date & time: 05/17/23  1812     History  Chief Complaint  Patient presents with   Facial Swelling    Jesse DUMAS [name] is a 62 y.o. male.  62 yo M with a chief complaints of facial swelling.  Tells me it has been going on for couple days.  Mostly to the nose and his upper lip.  Feels numb at times.  Seems to come and go.  He received a dose of steroids in urgent care and then was told to come here to be evaluated.  He feels like his ears been fullness a bit of a headache as well.  His wife just passed away and he is wondering if it is due to flowers or something at the funeral home.        Home Medications Prior to Admission medications   Medication Sig Start Date End Date Taking? Authorizing Provider  doxycycline (VIBRAMYCIN) 100 MG capsule Take 1 capsule (100 mg total) by mouth 2 (two) times daily. One po bid x 7 days 05/17/23  Yes Melene Plan, DO  amiodarone (PACERONE) 200 MG tablet Take 1 tablet (200 mg total) by mouth daily. 04/19/23   Fenton, Clint R, PA  Cholecalciferol (VITAMIN D-3) 125 MCG (5000 UT) TABS Take 5,000 Units by mouth daily. 03/17/23   Lula Olszewski, MD  cloNIDine (CATAPRES - DOSED IN MG/24 HR) 0.1 mg/24hr patch 0.1 mg once a week. 04/09/23   [provider]  ezetimibe (ZETIA) 10 MG tablet Take 1 tablet (10 mg total) by mouth daily. 03/17/23   Lula Olszewski, MD  fluticasone [facility]) 50 MCG/ACT nasal spray Place 2 sprays into both nostrils daily. Patient taking differently: Place 2 sprays into both nostrils as needed. 02/17/23   Lula Olszewski, MD  hydrOXYzine (ATARAX) 25 MG tablet Take 1 tablet (25 mg total) by mouth 3 (three) times daily as needed. 05/11/23   Lula Olszewski, MD  isosorbide mononitrate (IMDUR) 30 MG 24 hr tablet Take 1 tablet (30 mg total) by mouth daily. Cannot take levitra with. 02/17/23   Lula Olszewski, MD  levothyroxine (SYNTHROID)  200 MCG tablet Take 1 tablet (200 mcg total) by mouth daily. Take as recommended by endocrine.  200 mg daily except for Sunday, take 100 mg. Patient taking differently: Take 100-200 mcg by mouth See admin instructions. Take as recommended by endocrine.  200 mg daily except for Sunday, take 100 mg. 09/15/22   Zigmund Daniel., MD  loratadine (CLARITIN) 10 MG tablet Take 1 tablet (10 mg total) by mouth daily. Patient taking differently: Take 10 mg by mouth as needed. 02/17/23   Lula Olszewski, MD  losartan (COZAAR) 100 MG tablet Take 1 tablet (100 mg total) by mouth daily. 03/17/23   Lula Olszewski, MD  metoprolol tartrate (LOPRESSOR) 25 MG tablet Take 1 tablet (25 mg total) by mouth 2 (two) times daily. 04/14/23   Lula Olszewski, MD  montelukast (SINGULAIR) 10 MG tablet Take 1 tablet (10 mg total) by mouth at bedtime. TAKE 1 TABLET BY MOUTH EVERYDAY AT BEDTIME Strength: 10 mg Patient not taking: Reported on 04/19/2023 03/17/23   Lula Olszewski, MD  Multiple Vitamins-Minerals (MULTIVITAMIN WITH MINERALS) tablet Take 1 tablet by mouth daily. 03/17/23   Lula Olszewski, MD  nicotine (NICODERM CQ - DOSED IN MG/24 HOURS) 21 mg/24hr patch Place 1 patch (21 mg  total) onto the skin daily. 03/17/23   Lula Olszewski, MD  pantoprazole (PROTONIX) 40 MG tablet Take 1 tablet (40 mg total) by mouth daily. Patient not taking: Reported on 04/19/2023 03/22/23 05/06/23  Graciella Freer, PA-C  rivaroxaban (XARELTO) 20 MG TABS tablet Take 1 tablet (20 mg total) by mouth daily with supper. 10/08/22   Lula Olszewski, MD  rosuvastatin (CRESTOR) 40 MG tablet Take 1 tablet (40 mg total) by mouth daily. Replaces 20 mg dose Patient not taking: Reported on 04/19/2023 10/08/22 10/03/23  Lula Olszewski, MD  Saline (SIMPLY SALINE) 0.9 % AERS Place 2 each into the nose as directed. Use nightly for sinus hygiene long-term.  Can also be used as many times daily as desired to assist with clearing congested sinuses. 02/17/23   Lula Olszewski, MD  spironolactone (ALDACTONE) 25 MG tablet Take 1 tablet (25 mg total) by mouth daily. 02/25/23   Lula Olszewski, MD  tamsulosin (FLOMAX) 0.4 MG CAPS capsule Take 1 capsule (0.4 mg total) by mouth daily. Patient not taking: Reported on 04/19/2023 10/08/22   Lula Olszewski, MD      Allergies    Sulfa drugs cross reactors, Codeine, and Hydrochlorothiazide    Review of Systems   Review of Systems  Physical Exam Updated Vital Signs BP (!) 164/85 (BP Location: Right Arm)   Pulse (!) 58   Temp 99 F (37.2 C) (Oral)   Resp 19   Ht 5\' 9"  (1.753 m)   Wt 91.6 kg   SpO2 92%   BMI 29.83 kg/m  Physical Exam Vitals and nursing note reviewed.  Constitutional:      Appearance: He is well-developed.  HENT:     Head: Normocephalic and atraumatic.     Nose:     Comments: The nose is erythematous diffusely worse about the prominence area.  No obvious edema to the upper lip.  No obvious intraoral swelling.  Tolerating secretions without difficulty.  TMs are normal bilaterally. Eyes:     Pupils: Pupils are equal, round, and reactive to light.  Neck:     Vascular: No JVD.  Cardiovascular:     Rate and Rhythm: Normal rate and regular rhythm.     Heart sounds: No murmur heard.    No friction rub. No gallop.  Pulmonary:     Effort: No respiratory distress.     Breath sounds: No wheezing.  Abdominal:     General: There is no distension.     Tenderness: There is no abdominal tenderness. There is no guarding or rebound.  Musculoskeletal:        General: Normal range of motion.     Cervical back: Normal range of motion and neck supple.  Skin:    Coloration: Skin is not pale.     Findings: No rash.  Neurological:     Mental Status: He is alert and oriented to person, place, and time.  Psychiatric:        Behavior: Behavior normal.     ED Results / Procedures / Treatments   Labs (all labs ordered are listed, but only abnormal results are displayed) Labs Reviewed - No data to  display  EKG None  Radiology No results found.  Procedures Procedures    Medications Ordered in ED Medications  doxycycline (VIBRA-TABS) tablet 100 mg (has no administration in time range)    ED Course/ Medical Decision Making/ A&P  Medical Decision Making Risk Prescription drug management.   62 yo M with a chief complaints of swelling to his nose and upper lip.  Going on for couple days.  He went to urgent care and was given a dose of steroids.  On my exam I do not appreciate any obvious swelling.  He does have some significant erythema to the tip of his nose which I think could be due to rosacea though no reported history of the same.  Will start him on oral antibiotics for the possibility of cellulitis.  Will have him follow-up with his family doctor in the office.  11:22 PM:  I have discussed the diagnosis/risks/treatment options with the patient.  Evaluation and diagnostic testing in the emergency department does not suggest an emergent condition requiring admission or immediate intervention beyond what has been performed at this time.  They will follow up with PCP. We also discussed returning to the ED immediately if new or worsening sx occur. We discussed the sx which are most concerning (e.g., sudden worsening pain, fever, inability to tolerate by mouth, worsening swelling, difficulty breathing or swallowing) that necessitate immediate return. Medications administered to the patient during their visit and any new prescriptions provided to the patient are listed below.  Medications given during this visit Medications  doxycycline (VIBRA-TABS) tablet 100 mg (has no administration in time range)     The patient appears reasonably screen and/or stabilized for discharge and I doubt any other medical condition or other [facility] requiring further screening, evaluation, or treatment in the ED at this time prior to discharge.          Final  Clinical Impression(s) / ED Diagnoses Final diagnoses:  Facial swelling    Rx / DC Orders ED Discharge Orders          Ordered    doxycycline (VIBRAMYCIN) 100 MG capsule  2 times daily        05/17/23 2316              Melene Plan, DO 05/17/23 2322

## 2023-05-19 ENCOUNTER — Encounter: Payer: Self-pay | Admitting: Podiatry

## 2023-05-19 ENCOUNTER — Ambulatory Visit: Payer: 59 | Admitting: Podiatry

## 2023-05-19 DIAGNOSIS — I4819 Other persistent atrial fibrillation: Secondary | ICD-10-CM | POA: Diagnosis not present

## 2023-05-19 DIAGNOSIS — L84 Corns and callosities: Secondary | ICD-10-CM

## 2023-05-19 DIAGNOSIS — Z89422 Acquired absence of other left toe(s): Secondary | ICD-10-CM

## 2023-05-19 DIAGNOSIS — D6869 Other thrombophilia: Secondary | ICD-10-CM

## 2023-05-19 NOTE — Progress Notes (Signed)
  Subjective:  Patient ID: Jesse Simmer [name], male    DOB: 20-Jan-1961,   MRN: [id number]  Chief Complaint  Patient presents with   Wound Check    F/U appointment, wound is doing very good per patient, no pain, drainage, bleeding or odor noticed    62 y.o. male presents for follow-up of left third toe wound. Relates doing well and dressing as instructed.  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   PCP:  Lula Olszewski, MD    . Denies any other pedal complaints. Denies n/v/f/c.   Past Medical History:  Diagnosis Date   Acute hypoxemic respiratory failure (HCC) 09/18/2022   Allergy    Anxiety    Atrial fibrillation (HCC)    Atrial fibrillation with RVR (HCC) 09/12/2022   Bell palsy    Depression    Diastolic heart failure    HAP (hospital-acquired pneumonia) 09/17/2022   Hypercholesterolemia    Hypertension    Hypokalemia 03/07/2023      moderate (2.8 - 3.5 meq/dl)  Recent started spironolactone          Lab Results      Component    Value    Date/Time           K    3.7    03/29/2023 05:26 PM           K    4.0    03/08/2023 11:04 AM           K    3.2 (L)    03/01/2023 10:09 AM           K    4.2    02/25/2023 03:37 PM           K    3.9    11/30/2022 10:29 AM           K    3.9    10/19/2022 10:03 AM           K    4.3    01   Pneumonia    Pneumonia of right lower lobe due to infectious organism 09/17/2022   Thyroid disease     Objective:  Physical Exam: Vascular: DP/PT pulses 2/4 bilateral. CFT <3 seconds. Normal hair growth on digits. No edema.  Skin. No lacerations or abrasions bilateral feet. Nails 1-5 on right and 3-5 on left are thickened and elongated. Hyperkeratosis noted to distal remiaing third digit on left with underlying ulceration healed.  No probe to bone. Hyperkeratotic lesion noted to residual left hallux.  Musculoskeletal: MMT 5/5 bilateral lower extremities in DF, PF, Inversion and Eversion. Deceased ROM in  DF of ankle joint. Previous amptuation of distal left hallux second and third digits.  Neurological: Sensation intact to light touch.   Assessment:   No diagnosis found.    Plan:  Patient was evaluated and treated and all questions answered.  Ulcer left third digit -healed  -Debridement of hyperkeratosis with chisel without incident.  -Dressed with abndage.  -May return to regular shoes.  -No abx indicated.  -Discussed glucose control and proper protein-rich diet.  -Discussed if any worsening redness, pain, fever or chills to call or may need to report to the emergency room. Patient expressed understanding.   Return in 4 weeks for wound check.   No follow-ups on file.    Louann Sjogren, DPM

## 2023-06-02 ENCOUNTER — Other Ambulatory Visit: Payer: Self-pay | Admitting: Internal Medicine

## 2023-06-03 ENCOUNTER — Other Ambulatory Visit: Payer: 59

## 2023-06-07 ENCOUNTER — Ambulatory Visit: Payer: 59 | Admitting: "Endocrinology

## 2023-06-11 ENCOUNTER — Other Ambulatory Visit: Payer: Self-pay | Admitting: Internal Medicine

## 2023-06-11 DIAGNOSIS — I10 Essential (primary) hypertension: Secondary | ICD-10-CM

## 2023-06-15 NOTE — Telephone Encounter (Signed)
Left vm for patient to call back concerning this medication.

## 2023-06-16 ENCOUNTER — Telehealth: Payer: Self-pay | Admitting: *Deleted

## 2023-06-16 ENCOUNTER — Encounter: Payer: Self-pay | Admitting: Podiatry

## 2023-06-16 ENCOUNTER — Encounter: Payer: Self-pay | Admitting: Internal Medicine

## 2023-06-16 ENCOUNTER — Ambulatory Visit (INDEPENDENT_AMBULATORY_CARE_PROVIDER_SITE_OTHER): Payer: 59 | Admitting: Podiatry

## 2023-06-16 DIAGNOSIS — I4819 Other persistent atrial fibrillation: Secondary | ICD-10-CM

## 2023-06-16 DIAGNOSIS — B351 Tinea unguium: Secondary | ICD-10-CM

## 2023-06-16 DIAGNOSIS — M79674 Pain in right toe(s): Secondary | ICD-10-CM

## 2023-06-16 DIAGNOSIS — D6869 Other thrombophilia: Secondary | ICD-10-CM

## 2023-06-16 DIAGNOSIS — M79675 Pain in left toe(s): Secondary | ICD-10-CM | POA: Diagnosis not present

## 2023-06-16 NOTE — Telephone Encounter (Signed)
Pharmacy please advise on holding Xarelto prior to possible  dental implants scheduled for TBD. Thank you.

## 2023-06-16 NOTE — Telephone Encounter (Signed)
Pre-operative Risk Assessment    Patient Name: Jesse Suarez.  DOB: Oct 21, 1960 MRN: [id number]  DATE OF LAST VISIT: 11/26/22 DR. MEALOR DATE OF NEXT VISIT: 06/22/23 DR. MEALOR   Request for Surgical Clearance    Procedure:   DENTAL IMPLANTS  2 AT THIS TIME; HOWEVER THE PT MAY HAVE ALL 4 IMPLANTS DONE AT THE SAME TIME  Date of Surgery:  Clearance TBD                                 Surgeon:  DR. Gloris Manchester, DMD Surgeon's Group or Practice Name:  [facility], D[facility] Phone number:  [phone]  Fax number:  [phone]   Type of Clearance Requested:   - Medical  - Pharmacy:  Hold Rivaroxaban (Xarelto)     Type of Anesthesia:  Local  WITH EPI   Additional requests/questions:    Elpidio Anis   06/16/2023, 3:16 PM

## 2023-06-16 NOTE — Telephone Encounter (Signed)
Error

## 2023-06-16 NOTE — Telephone Encounter (Signed)
Patient with diagnosis of afib on Xarelto for anticoagulation.    Procedure: 2-4 dental implants Date of procedure: TBD  CHA2DS2-VASc Score = 3  This indicates a 3.2% annual risk of stroke. The patient's score is based upon: CHF History: 1 HTN History: 1 Diabetes History: 0 Stroke History: 0 Vascular Disease History: 1 Age Score: 0 Gender Score: 0   CrCl 54mL/min Platelet count 235K  Patient does not require pre-op antibiotics for dental procedure.  Pt underwent afib ablation 03/22/23 and cannot stop anticoag for 3 months after (06/22/23). Once pt is outside of this window, he can hold Xarelto for 1 day prior to procedure.  **This guidance is not considered finalized until pre-operative APP has relayed final recommendations.**

## 2023-06-16 NOTE — Telephone Encounter (Signed)
Name: Jesse TINA [name]  DOB: 08-01-1961  MRN: [id number]  Primary Cardiologist: None  Chart reviewed as part of pre-operative protocol coverage. The patient has an upcoming visit scheduled with Dr. Nelly Laurence  on 06/22/23 at which time clearance can be addressed in case there are any issues that would impact surgical recommendations.  Pt underwent afib ablation 03/22/23 and cannot stop anticoag for 3 months after (06/22/23). Once pt is outside of this window, he can hold Xarelto for 1 day prior to procedure.   I will route this message as FYI to requesting party and remove this message from the preop box as separate preop APP input not needed at this time.   Please call with any questions.  Napoleon Form, Leodis Rains, NP  06/16/2023, 5:03 PM

## 2023-06-16 NOTE — Progress Notes (Signed)
Subjective:  Patient ID: Jesse Simmer [name], male    DOB: 04-Aug-1961,   MRN: [id number]  No chief complaint on file.   62 y.o. male presents for follow-up of left third toe wound. Relates doing well and dressing as instructed.  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 Also concern of thickened elongated and painful nails that are difficult to trim. Requesting to have them trimmed today. Relates burning and tingling in their feet. Patient is diabetic and last A1c was  Lab Results  Component Value Date   HGBA1C 6.1 (A) 03/04/2023   .   PCP:  Lula Olszewski, MD     PCP:  Lula Olszewski, MD    . Denies any other pedal complaints. Denies n/v/f/c.   Past Medical History:  Diagnosis Date   Acute hypoxemic respiratory failure (HCC) 09/18/2022   Allergy    Anxiety    Atrial fibrillation (HCC)    Atrial fibrillation with RVR (HCC) 09/12/2022   Bell palsy    Depression    Diastolic heart failure    HAP (hospital-acquired pneumonia) 09/17/2022   Hypercholesterolemia    Hypertension    Hypokalemia 03/07/2023      moderate (2.8 - 3.5 meq/dl)  Recent started spironolactone          Lab Results      Component    Value    Date/Time           K    3.7    03/29/2023 05:26 PM           K    4.0    03/08/2023 11:04 AM           K    3.2 (L)    03/01/2023 10:09 AM           K    4.2    02/25/2023 03:37 PM           K    3.9    11/30/2022 10:29 AM           K    3.9    10/19/2022 10:03 AM           K    4.3    01   Pneumonia    Pneumonia of right lower lobe due to infectious organism 09/17/2022   Thyroid disease     Objective:  Physical Exam: Vascular: DP/PT pulses 2/4 bilateral. CFT <3 seconds. Normal hair growth on digits. No edema.  Skin. No lacerations or abrasions bilateral feet. Nails 1-5 on right and 3-5 on left are thickened and elongated. Hyperkeratosis noted to distal remiaing third digit on left with underlying ulceration healed.  No  probe to bone. Hyperkeratotic lesion noted to residual left hallux.  Musculoskeletal: MMT 5/5 bilateral lower extremities in DF, PF, Inversion and Eversion. Deceased ROM in DF of ankle joint. Previous amptuation of distal left hallux second and third digits.  Neurological: Sensation intact to light touch.   Assessment:   No diagnosis found.    Plan:  Patient was evaluated and treated and all questions answered.  Ulcer left third digit -healed  -Discussed and educated patient on diabetic foot care, especially with  regards to the vascular, neurological and musculoskeletal systems.  -Stressed the importance of good glycemic control and the detriment of not  controlling glucose levels in relation to the foot. -Discussed supportive shoes at all times and checking feet regularly.  -  Mechanically debrided all nails  bilateral using sterile nail nipper and filed with dremel without incident  -Answered all patient questions -Patient to return  in 3 months for at risk foot care -Patient advised to call the office if any problems or questions arise in the meantime.   No follow-ups on file.    Louann Sjogren, DPM

## 2023-06-17 ENCOUNTER — Other Ambulatory Visit: Payer: 59

## 2023-06-18 ENCOUNTER — Other Ambulatory Visit: Payer: Self-pay | Admitting: Internal Medicine

## 2023-06-18 ENCOUNTER — Other Ambulatory Visit (INDEPENDENT_AMBULATORY_CARE_PROVIDER_SITE_OTHER): Payer: 59

## 2023-06-18 DIAGNOSIS — E890 Postprocedural hypothyroidism: Secondary | ICD-10-CM

## 2023-06-18 DIAGNOSIS — Z1211 Encounter for screening for malignant neoplasm of colon: Secondary | ICD-10-CM

## 2023-06-18 DIAGNOSIS — Z1212 Encounter for screening for malignant neoplasm of rectum: Secondary | ICD-10-CM

## 2023-06-18 LAB — TSH: TSH: 0.69 u[IU]/mL (ref 0.35–5.50)

## 2023-06-21 ENCOUNTER — Ambulatory Visit (INDEPENDENT_AMBULATORY_CARE_PROVIDER_SITE_OTHER): Payer: 59 | Admitting: "Endocrinology

## 2023-06-21 ENCOUNTER — Encounter: Payer: Self-pay | Admitting: "Endocrinology

## 2023-06-21 VITALS — BP 140/90 | HR 96 | Ht 69.0 in | Wt 202.4 lb

## 2023-06-21 DIAGNOSIS — E890 Postprocedural hypothyroidism: Secondary | ICD-10-CM

## 2023-06-21 NOTE — Progress Notes (Signed)
Outpatient Endocrinology Note Jesse [place], MD  06/21/23   [place] SUDA [name] May 11, 1961 [id number]  Referring Provider: Lula Olszewski, MD Primary Care Provider: Lula Olszewski, MD Subjective  No chief complaint on file.   Assessment & Plan  Diagnoses and all orders for this visit:  Hypothyroidism following radioiodine therapy -     TSH Rfx on Abnormal to Free T4; Future    [name] is currently taking synthroid 200 mcg Mon-Sat and half a pill ( ) on Sunday. Patient is currently biochemically euthyroid.  Educated on thyroid axis.  Recommend the following: Take synthroid 200 mcg Mon-Sat and half a pill ( ) on Sunday. Advised to take levothyroxine first thing in the morning on empty stomach and wait at least 30 minutes to 1 hour before eating or drinking anything or taking any other medications. Space out levothyroxine by 4 hours from any acid reflux medication/fibrate/iron/calcium/multivitamin. Advised to take birth control pills and nutritional supplements in the evening. Repeat lab before next visit or sooner if symptoms of hyperthyroidism or hypothyroidism develop.  Notify us immediately in case of pregnancy/breastfeeding or significant weight gain or loss. Counseled on compliance and follow up needs.  I have reviewed current medications, nurse's notes, allergies, vital signs, past medical and surgical history, family medical history, and social history for this encounter. Counseled patient on symptoms, examination findings, lab findings, imaging results, treatment decisions and monitoring and prognosis. The patient understood the recommendations and agrees with the treatment plan. All questions regarding treatment plan were fully answered.   Return in about 4 months (around 10/22/2023) for visit, labs before next visit.   Jesse [place], MD  06/21/23   I have reviewed current medications, nurse's notes, allergies, vital signs, past  medical and surgical history, family medical history, and social history for this encounter. Counseled patient on symptoms, examination findings, lab findings, imaging results, treatment decisions and monitoring and prognosis. The patient understood the recommendations and agrees with the treatment plan. All questions regarding treatment plan were fully answered.   History of Present Illness Jesse FLY [name] is a 62 y.o. year old male who presents to our clinic with post-ablative hypothyroidism.    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   Symptoms suggestive of HYPOTHYROIDISM:  fatigue No weight gain No cold intolerance  No constipation  No  Symptoms suggestive of HYPERTHYROIDISM:  weight loss  No heat intolerance No hyperdefecation  No palpitations  No  Compressive symptoms:  dysphagia  No dysphonia  No positional dyspnea (especially with simultaneous arms elevation)  No  Smokes  Yes On biotin  No Personal history of head/neck surgery/irradiation  Yes   Physical Exam  BP (!) 140/90   Pulse 96   Ht 5\' 9"  (1.753 m)   Wt 202 lb 6.4 oz (91.8 kg)   SpO2 99%   BMI 29.89 kg/m  Constitutional: well developed, well nourished Head: normocephalic, atraumatic, no exophthalmos Eyes: sclera anicteric, no redness Neck: no thyromegaly, no thyroid tenderness; no nodules palpated Lungs: normal respiratory effort Neurology: alert and oriented, no fine hand tremor Skin: dry, no appreciable rashes Musculoskeletal: no appreciable defects Psychiatric: normal mood and affect  Allergies Allergies  Allergen Reactions   Sulfa Drugs Cross Reactors Hives   Codeine Other (See Comments)    Made him very jittery    Hydrochlorothiazide Other (See Comments)    Hyponatremia    Current Medications Patient's Medications  New Prescriptions   No medications on file  Previous Medications   AMIODARONE (PACERONE) 200 MG TABLET    Take 1 tablet (200 mg total) by  mouth daily.   CHOLECALCIFEROL (VITAMIN D-3) 125 MCG (5000 UT) TABS    Take 5,000 Units by mouth daily.   CLONIDINE (CATAPRES - DOSED IN MG/24 HR) 0.1 MG/24HR PATCH    0.1 mg once a week.   DOXYCYCLINE (VIBRAMYCIN) 100 MG CAPSULE    Take 1 capsule (100 mg total) by mouth 2 (two) times daily. One po bid x 7 days   EZETIMIBE (ZETIA) 10 MG TABLET    Take 1 tablet (10 mg total) by mouth daily.   FLUTICASONE (FLONASE) 50 MCG/ACT NASAL SPRAY    Place 2 sprays into both nostrils daily.   HYDROXYZINE (ATARAX) 25 MG TABLET    Take 1 tablet (25 mg total) by mouth 3 (three) times daily as needed.   ISOSORBIDE MONONITRATE (IMDUR) 30 MG 24 HR TABLET    Take 1 tablet (30 mg total) by mouth daily. Cannot take levitra with.   LEVOTHYROXINE (SYNTHROID) 200 MCG TABLET    Take 1 tablet (200 mcg total) by mouth daily. Take as recommended by endocrine.  200 mg daily except for Sunday, take 100 mg.   LORATADINE (CLARITIN) 10 MG TABLET    Take 1 tablet (10 mg total) by mouth daily.   LOSARTAN (COZAAR) 100 MG TABLET    Take 1 tablet (100 mg total) by mouth daily.   METOPROLOL TARTRATE (LOPRESSOR) 25 MG TABLET    Take 1 tablet (25 mg total) by mouth 2 (two) times daily.   MONTELUKAST (SINGULAIR) 10 MG TABLET    Take 1 tablet (10 mg total) by mouth at bedtime. TAKE 1 TABLET BY MOUTH EVERYDAY AT BEDTIME Strength: 10 mg   MULTIPLE VITAMINS-MINERALS (MULTIVITAMIN WITH MINERALS) TABLET    Take 1 tablet by mouth daily.   NICOTINE (NICODERM CQ - DOSED IN MG/24 HOURS) 21 MG/24HR PATCH    Place 1 patch (21 mg total) onto the skin daily.   PANTOPRAZOLE (PROTONIX) 40 MG TABLET    Take 1 tablet (40 mg total) by mouth daily.   RIVAROXABAN (XARELTO) 20 MG TABS TABLET    Take 1 tablet (20 mg total) by mouth daily with supper.   ROSUVASTATIN (CRESTOR) 40 MG TABLET    Take 1 tablet (40 mg total) by mouth daily. Replaces 20 mg dose   SALINE (SIMPLY SALINE) 0.9 % AERS    Place 2 each into the nose as directed. Use nightly for sinus hygiene  long-term.  Can also be used as many times daily as desired to assist with clearing congested sinuses.   SPIRONOLACTONE (ALDACTONE) 25 MG TABLET    Take 1 tablet (25 mg total) by mouth daily.   TAMSULOSIN (FLOMAX) 0.4 MG CAPS CAPSULE    Take 1 capsule (0.4 mg total) by mouth daily.  Modified Medications   No medications on file  Discontinued Medications   No medications on file    Past Medical History Past Medical History:  Diagnosis Date   Acute hypoxemic respiratory failure (HCC) 09/18/2022   Allergy    Anxiety    Atrial fibrillation (HCC)    Atrial fibrillation with RVR (HCC) 09/12/2022   Bell palsy    Depression    Diastolic heart failure    HAP (hospital-acquired pneumonia) 09/17/2022   Hypercholesterolemia    Hypertension    Hypokalemia 03/07/2023      moderate (2.8 - 3.5 meq/dl)  Recent started spironolactone  Lab Results      Component    Value    Date/Time           K    3.7    03/29/2023 05:26 PM           K    4.0    03/08/2023 11:04 AM           K    3.2 (L)    03/01/2023 10:09 AM           K    4.2    02/25/2023 03:37 PM           K    3.9    11/30/2022 10:29 AM           K    3.9    10/19/2022 10:03 AM           K    4.3    01   Pneumonia    Pneumonia of right lower lobe due to infectious organism 09/17/2022   Thyroid disease     Past Surgical History Past Surgical History:  Procedure Laterality Date   ATRIAL FIBRILLATION ABLATION N/A 03/22/2023   Procedure: ATRIAL FIBRILLATION ABLATION;  Surgeon: Maurice Small, MD;  Location: [facility]ASIVE [facility];  Service: Cardiovascular;  Laterality: N/A;   CARDIOVERSION N/A 09/15/2022   Procedure: CARDIOVERSION;  Surgeon: Christell Constant, MD;  Location: [facility] ENDOSCOPY;  Service: Cardiovascular;  Laterality: N/A;   CARDIOVERSION N/A 10/22/2022   Procedure: CARDIOVERSION;  Surgeon: Chrystie Nose, MD;  Location: [facility] ENDOSCOPY;  Service: Cardiovascular;  Laterality: N/A;   CHOLECYSTECTOMY     HERNIA REPAIR     KNEE  SURGERY     VASECTOMY      Family History family history includes Alcohol abuse in his father; Cirrhosis in his father; Heart attack in his mother; Heart disease in his mother; Hypertension in his father; Kidney cancer in his mother; Mental illness in his brother; Other in his father and mother; Parkinsonism in his brother.  Social History Social History   Socioeconomic History   Marital status: Married    Spouse name: Not on file   Number of children: 1   Years of education: Not on file   Highest education level: Not on file  Occupational History   Not on file  Tobacco Use   Smoking status: Every Day    Current packs/day: 0.50    Average packs/day: 0.5 packs/day for 44.8 years (22.4 ttl pk-yrs)    Types: Cigarettes    Start date: 09/14/1978   Smokeless tobacco: Never  Vaping Use   Vaping status: Never Used  Substance and Sexual Activity   Alcohol use: Yes    Alcohol/week: 0.0 standard drinks of alcohol    Comment: rarely   Drug use: No   Sexual activity: Never    Birth control/protection: None  Other Topics Concern   Not on file  Social History Narrative   Marital status: married x 30+ years      Children:  2 children;(38,37); 3+3 grandchild      Lives:  With wife, son      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      Tobacco: 1 ppd x 30 years      Alcohol: rare     Regular exercise: walking daily   Caffeine use: daily      Social Determinants of Health   Financial Resource Strain: Not on file  Food Insecurity: No Food Insecurity (09/13/2022)   Hunger Vital Sign    Worried About Running Out of Food in the Last Year: Never true    Ran Out of Food in the Last Year: Never true  Transportation Needs: No Transportation Needs (09/13/2022)   PRAPARE - Administrator, Civil Service (Medical): No    Lack of Transportation (Non-Medical): No  Physical Activity: Not on file  Stress: Not on file  Social Connections: Unknown (01/27/2022)    [facility]   Social Network    Social Network: Not on file  Intimate Partner Violence: Not At Risk (09/13/2022)   Humiliation, Afraid, Rape, and Kick questionnaire    Fear of Current or Ex-Partner: No    Emotionally Abused: No    Physically Abused: No    Sexually Abused: No    Laboratory Investigations Lab Results  Component Value Date   TSH 0.69 06/18/2023   TSH 8.05 (H) 03/01/2023   TSH 0.22 (L) 11/30/2022   FREET4 1.12 03/01/2023   FREET4 1.52 11/30/2022   FREET4 2.27 (H) 10/19/2022     No results found for: "TSI"   No components found for: "TRAB"   Lab Results  Component Value Date   CHOL 167 03/31/2022   Lab Results  Component Value Date   HDL 52.70 03/31/2022   Lab Results  Component Value Date   LDLCALC 84 03/31/2022   Lab Results  Component Value Date   TRIG 155.0 (H) 03/31/2022   Lab Results  Component Value Date   CHOLHDL 3 03/31/2022   Lab Results  Component Value Date   CREATININE 1.43 04/14/2023   Lab Results  Component Value Date   GFR 51.84 (L) 04/14/2023      Component Value Date/Time   NA 140 04/14/2023 1456   NA 141 03/08/2023 1104   K 4.3 04/14/2023 1456   CL 103 04/14/2023 1456   CO2 31 04/14/2023 1456   GLUCOSE 89 04/14/2023 1456   BUN 12 04/14/2023 1456   BUN 12 03/08/2023 1104   CREATININE 1.43 04/14/2023 1456   CREATININE 0.98 01/06/2017 1617   CALCIUM 9.0 04/14/2023 1456   PROT 7.1 03/29/2023 1726   PROT 6.6 10/19/2022 1003   ALBUMIN 4.3 03/29/2023 1726   ALBUMIN 4.3 10/19/2022 1003   AST 18 03/29/2023 1726   ALT 25 03/29/2023 1726   ALKPHOS 85 03/29/2023 1726   BILITOT 0.7 03/29/2023 1726   BILITOT 0.5 10/19/2022 1003   GFRNONAA >60 03/29/2023 1726   GFRNONAA >89 05/22/2016 1308   GFRAA >60 09/30/2019 1937   GFRAA >89 05/22/2016 1308      Latest Ref Rng & Units 04/14/2023    2:56 PM 03/29/2023    5:26 PM 03/08/2023   11:04 AM  BMP  Glucose 70 - 99 mg/dL 89  90  161   BUN 6 - 23  mg/dL 12  12  12    Creatinine 0.40 - 1.50 mg/dL 0.96  0.45  4.09   BUN/Creat Ratio 10 - 24   10   Sodium 135 - 145 mEq/L 140  137  141   Potassium 3.5 - 5.1 mEq/L 4.3  3.7  4.0   Chloride 96 - 112 mEq/L 103  103  99   CO2 19 - 32 mEq/L 31  27  25    Calcium 8.4 - 10.5 mg/dL 9.0  9.0  8.7        Component Value Date/Time   WBC  9.8 04/14/2023 1456   RBC 5.13 04/14/2023 1456   HGB 15.5 04/14/2023 1456   HGB 16.9 03/08/2023 1104   HCT 47.4 04/14/2023 1456   HCT 53.8 (H) 03/08/2023 1104   PLT 235.0 04/14/2023 1456   PLT 209 03/08/2023 1104   MCV 92.4 04/14/2023 1456   MCV 92 03/08/2023 1104   MCH 29.9 03/29/2023 1726   MCHC 32.7 04/14/2023 1456   RDW 16.5 (H) 04/14/2023 1456   RDW 14.4 03/08/2023 1104   LYMPHSABS 1.8 04/14/2023 1456   MONOABS 0.9 04/14/2023 1456   EOSABS 0.3 04/14/2023 1456   BASOSABS 0.1 04/14/2023 1456      Parts of this note may have been dictated using voice recognition software. There may be variances in spelling and vocabulary which are unintentional. Not all errors are proofread. Please notify the Thereasa Parkin if any discrepancies are noted or if the meaning of any statement is not clear.

## 2023-06-22 ENCOUNTER — Ambulatory Visit: Payer: 59 | Attending: Cardiovascular Disease | Admitting: Cardiovascular Disease

## 2023-06-22 ENCOUNTER — Encounter: Payer: Self-pay | Admitting: Cardiovascular Disease

## 2023-06-22 VITALS — BP 118/60 | HR 48 | Ht 69.0 in | Wt 200.4 lb

## 2023-06-22 DIAGNOSIS — I4819 Other persistent atrial fibrillation: Secondary | ICD-10-CM

## 2023-06-22 DIAGNOSIS — I493 Ventricular premature depolarization: Secondary | ICD-10-CM

## 2023-06-22 NOTE — Patient Instructions (Signed)
Medication Instructions:  STOP Amiodarone *If you need a refill on your cardiac medications before your next appointment, please call your pharmacy*   Follow-Up: At [facility], you and your health needs are our priority.  As part of our continuing mission to provide you with exceptional heart care, we have created designated Provider Care Teams.  These Care Teams include your primary Cardiologist (physician) and Advanced Practice Providers (APPs -  Physician Assistants and Nurse Practitioners) who all work together to provide you with the care you need, when you need it.  We recommend signing up for the patient portal called "MyChart".  Sign up information is provided on this After Visit Summary.  MyChart is used to connect with patients for Virtual Visits (Telemedicine).  Patients are able to view lab/test results, encounter notes, upcoming appointments, etc.  Non-urgent messages can be sent to your provider as well.   To learn more about what you can do with MyChart, go to [email or web address].    Your next appointment:   6 month(s)  Provider:   You will follow up in the Atrial Fibrillation Clinic located at [facility]. Your provider will be: Clint R. Fenton, PA-C

## 2023-06-22 NOTE — Progress Notes (Signed)
Electrophysiology Office Note:    Date:  06/22/2023   ID:  Jesse Simmer [name], DOB August 26, 1961, MRN [id number]  PCP:  Lula Olszewski, MD   [facility] Providers Cardiologist:  None Electrophysiologist:  Lewayne Bunting, MD     Referring MD: Lula Olszewski, MD   History of Present Illness:    Jesse ROULHAC [name] is a 62 y.o. male with a hx listed below, significant for atrial fibrillation, referred fby dr. Ladona Ridgel to discuss ablation.  He was diagnosed with AF years ago in the setting of thyrotoxicosis. Despite having this thyroid disease under control, he had recurrence of AF and was placed on amiodarone. He had a DCCV but had early recurrence of AF.   He underwent uncomplicated ablation of atrial fibrillation on March 22, 2023 with wide encircling of the pulmonary veins with radiofrequency.  He sometimes feels his AF -- palpitations, fatigue. At present he feels reasonably well.      EKGs/Labs/Other Studies Reviewed Today:    Cardiac Studies & Procedures       ECHOCARDIOGRAM  ECHOCARDIOGRAM COMPLETE 09/12/2022  Narrative ECHOCARDIOGRAM REPORT    Patient Name:   Jesse LAMONS [name] Date of Exam: 09/12/2022 Medical Rec #:  [id number]            Height:       69.0 in Accession #:    [id number]           Weight:       193.0 lb Date of Birth:  1961/05/10            BSA:          2.035 m Patient Age:    61 years             BP:           119/92 mmHg Patient Gender: M                    HR:           130 bpm. Exam Location:  Inpatient  Procedure: 2D Echo, Cardiac Doppler and Color Doppler  Indications:    Atrial Fibrillation  History:        Patient has no prior history of Echocardiogram examinations. Diastolic HF, Arrythmias:Atrial Fibrillation; Risk Factors:Hypertension. Graves disease.  Sonographer:    Milda Smart Referring Phys: Laurance Flatten, E   Sonographer Comments: Image acquisition challenging due to patient body  habitus. IMPRESSIONS   1. Left ventricular ejection fraction, by estimation, is 40 to 45%. The left ventricle has mildly decreased function. The left ventricle demonstrates global hypokinesis. There is mild concentric left ventricular hypertrophy. Left ventricular diastolic function could not be evaluated. 2. Right ventricular systolic function is normal. The right ventricular size is mildly enlarged. There is normal pulmonary artery systolic pressure. The estimated right ventricular systolic pressure is 21.3 mmHg. 3. Left atrial size was moderately dilated. 4. Right atrial size was moderately dilated. 5. The mitral valve is normal in structure. Mild mitral valve regurgitation. 6. The aortic valve is tricuspid. Aortic valve regurgitation is not visualized. No aortic stenosis is present. 7. The inferior vena cava is normal in size with greater than 50% respiratory variability, suggesting right atrial pressure of 3 mmHg.  FINDINGS Left Ventricle: Left ventricular ejection fraction, by estimation, is 40 to 45%. The left ventricle has mildly decreased function. The left ventricle demonstrates global hypokinesis. The left ventricular internal cavity size was normal in size. There is  mild concentric left ventricular hypertrophy. Left ventricular diastolic function could not be evaluated due to atrial fibrillation. Left ventricular diastolic function could not be evaluated.  Right Ventricle: The right ventricular size is mildly enlarged. No increase in right ventricular wall thickness. Right ventricular systolic function is normal. There is normal pulmonary artery systolic pressure. The tricuspid regurgitant velocity is 2.14 m/s, and with an assumed right atrial pressure of 3 mmHg, the estimated right ventricular systolic pressure is 21.3 mmHg.  Left Atrium: Left atrial size was moderately dilated.  Right Atrium: Right atrial size was moderately dilated.  Pericardium: There is no evidence of  pericardial effusion.  Mitral Valve: The mitral valve is normal in structure. Mild mitral valve regurgitation.  Tricuspid Valve: The tricuspid valve is normal in structure. Tricuspid valve regurgitation is trivial.  Aortic Valve: The aortic valve is tricuspid. Aortic valve regurgitation is not visualized. No aortic stenosis is present.  Pulmonic Valve: The pulmonic valve was normal in structure. Pulmonic valve regurgitation is not visualized.  Aorta: The aortic root and ascending aorta are structurally normal, with no evidence of dilitation.  Venous: The inferior vena cava is normal in size with greater than 50% respiratory variability, suggesting right atrial pressure of 3 mmHg.  IAS/Shunts: The interatrial septum was not well visualized.   LEFT VENTRICLE PLAX 2D LVIDd:         4.50 cm LVIDs:         3.58 cm LV PW:         1.30 cm LV IVS:        1.30 cm   LEFT ATRIUM         Index LA diam:    4.36 cm 2.14 cm/m TRICUSPID VALVE TR Peak grad:   18.3 mmHg TR Vmax:        214.00 cm/s  Thurmon Fair MD Electronically signed by Thurmon Fair MD Signature Date/Time: 09/12/2022/3:02:12 PM    Final             EKG Interpretation Date/Time:  Tuesday June 22 2023 15:01:12 EDT Ventricular Rate:  48 PR Interval:  144 QRS Duration:  88 QT Interval:  550 QTC Calculation: 491 R Axis:   91  Text Interpretation: Sinus bradycardia Rightward axis Prolonged QT When compared with ECG of 19-Apr-2023 11:04, Premature supraventricular complexes are no longer Present Confirmed by York Pellant [id number]) on 06/22/2023 3:02:56 PM    Recent Labs: 09/20/2022: B Natriuretic Peptide 411.4 09/23/2022: Magnesium 2.0 03/29/2023: ALT 25 04/14/2023: BUN 12; Creatinine, Ser 1.43; Hemoglobin 15.5; Platelets 235.0; Potassium 4.3; Sodium 140 06/18/2023: TSH 0.69     Physical Exam:    VS:  BP 118/60 (BP Location: Left Arm, Patient Position: Sitting, Cuff Size: Large)   Pulse (!) 48   Ht 5\' 9"   (1.753 m)   Wt 200 lb 6.4 oz (90.9 kg)   SpO2 98%   BMI 29.59 kg/m     Wt Readings from Last 3 Encounters:  06/22/23 200 lb 6.4 oz (90.9 kg)  06/21/23 202 lb 6.4 oz (91.8 kg)  05/17/23 202 lb (91.6 kg)     GEN: Well nourished, well developed in no acute distress CARDIAC: iRRR, no murmurs, rubs, gallops RESPIRATORY:  Normal work of breathing MUSCULOSKELETAL: no edema    ASSESSMENT & PLAN:    Persistent AF:  failed amiodarone, has mild symptoms.  Status post atrial fibrillation ablation on March 22, 2023 with radiofrequency [facility] To his knowledge, he has remained free of atrial fibrillation since the ablation  Discontinue amiodarone today Follow-up in atrial fibrillation clinic in 6 months  CHFmrEF:  on metoprolol 25, losartan 100, spironolactone 25.  Rhythm control should help.  Bradycardia Sinus bradycardia  Hypertension BP well controlled today No changes  Secondary hypercoagulable state: Continue Xarelto 20        Medication Adjustments/Labs and Tests Ordered: Current medicines are reviewed at length with the patient today.  Concerns regarding medicines are outlined above.  Orders Placed This Encounter  Procedures   EKG 12-Lead   No orders of the defined types were placed in this encounter.    Signed, Maurice Small, MD  06/22/2023 3:07 PM    [facility]

## 2023-06-24 ENCOUNTER — Encounter: Payer: Self-pay | Admitting: Internal Medicine

## 2023-06-24 ENCOUNTER — Ambulatory Visit: Payer: 59 | Admitting: Internal Medicine

## 2023-06-24 VITALS — BP 130/80 | HR 49 | Temp 97.8°F | Ht 69.0 in | Wt 198.8 lb

## 2023-06-24 DIAGNOSIS — Z79899 Other long term (current) drug therapy: Secondary | ICD-10-CM

## 2023-06-24 DIAGNOSIS — Z634 Disappearance and death of family member: Secondary | ICD-10-CM

## 2023-06-24 DIAGNOSIS — F4321 Adjustment disorder with depressed mood: Secondary | ICD-10-CM

## 2023-06-24 DIAGNOSIS — N1831 Chronic kidney disease, stage 3a: Secondary | ICD-10-CM

## 2023-06-24 DIAGNOSIS — G4709 Other insomnia: Secondary | ICD-10-CM | POA: Diagnosis not present

## 2023-06-24 MED ORDER — LORAZEPAM 1 MG PO TABS
1.0000 mg | ORAL_TABLET | Freq: Two times a day (BID) | ORAL | 1 refills | Status: DC | PRN
Start: 2023-06-24 — End: 2023-10-11

## 2023-06-24 NOTE — Progress Notes (Signed)
[facility]: [phone]   -- Medical Office Visit --  Patient:  Jesse MULKERN [name]      Age: 62 y.o.       Sex:  male  Date:   06/24/2023 Patient Care Team: Lula Olszewski, MD as PCP - General (Internal Medicine) Mealor, Roberts Gaudy, MD as PCP - Electrophysiology (Cardiology) Reather Littler, MD (Inactive) as Consulting Physician (Endocrinology) Marinus Maw, MD as Consulting Physician (Cardiology) Jeani Hawking, MD as Consulting Physician (Gastroenterology) Mealor, Roberts Gaudy, MD as Consulting Physician (Cardiology) Today's Healthcare Provider: Lula Olszewski, MD      Assessment & Plan Grief    Grief and Insomnia Following the recent loss of his spouse, he is experiencing significant grief and insomnia. Hydroxyzine proved ineffective. We discussed the risks and benefits of benzodiazepines, specifically Ativan, for short-term use. We will start Ativan 1mg  at bedtime for sleep and anxiety, advising him to take half the dose initially to assess tolerance. We will review in a few weeks to assess efficacy and discuss potential discontinuation.  Support System He has a support system that includes grandchildren, friends, and a church community, and is currently receiving support from hospice. We encourage continued engagement with his support system and hospice services.  Follow-up He expressed satisfaction with his care and a desire to continue follow-up. We will schedule a follow-up appointment in 3 months or sooner if needed.  Bereavement  Other insomnia  High risk medication use PDMP reviewed during this encounter.  We discussed the risks of the medication Ativan for addiction and how it cannot be combined with alcohol or his prior hydrocodone he does not have any more or any sedatives and not to drive on it  Meds ordered this encounter  Medications   LORazepam (ATIVAN) 1 MG tablet    Sig: Take 1 tablet (1 mg total) by mouth 2 (two) times daily as  needed for anxiety or sleep.    Dispense:  20 tablet    Refill:  1    Recommended follow-up: No follow-ups on file. Future Appointments  Date Time Provider Department Center  09/22/2023  2:45 PM Louann Sjogren, [place] [facility]  09/24/2023  2:00 PM Lula Olszewski, MD [facility]  10/15/2023 10:00 AM [facility] None  10/22/2023 10:00 AM Altamese Frost, MD [facility] None  12/21/2023 11:00 AM Eustace Pen, PA-C [facility] None            Subjective   62 y.o. male who has Atrial fibrillation (paroxysmal with history of RVR; Resistant hypertension; Depression with anxiety; ED (erectile dysfunction); Hx of Bell's palsy; Amputated toe (HCC); Cigarette smoker; Bone neoplasm; PVC's (premature ventricular contractions); COPD GOLD 0/ emphysema on CT ; Vitamin D deficiency; Acute kidney injury (HCC); Iron deficiency anemia; Tobacco use disorder; Mood disorder (HCC); Prolonged QT interval; Lumbar degenerative disc disease; Bladder dysfunction; Seasonal allergies; Major depressive disorder, single episode, in remission (HCC); Gout; Hypothyroidism; Hyperlipidemia; CAD (coronary artery disease); Prediabetes; Abdominal pain; Peripheral arterial disease (HCC); Diverticular disease; Thoracic spondylosis; Microscopic hematuria; Aortic atherosclerosis (HCC); Obesity due to energy imbalance; Epigastric pain; Chronic kidney disease, stage 2 (mild); Hematuria; Proteinuria; Hypercoagulable state due to persistent atrial fibrillation (HCC); and BPH (benign prostatic hyperplasia) on their problem list. His reasons/main concerns/chief complaints for today's office visit are 3 month follow-up   ------------------------------------------------------------------------------------------------------------------------ AI-Extracted: Discussed the use of AI scribe software for clinical note transcription with the patient, who gave verbal consent to proceed.  History of Present Illness  The patient,  with a history of anxiety, presents with insomnia and grief following the recent loss of his spouse of 38 years. He reports difficulty sleeping, often staying up all night, and experiencing days of emotional distress. This is his first major loss, with no prior history of PTSD or significant trauma. He has been prescribed hydroxyzine in the past, but found it unhelpful in managing his current emotional state.  The patient has previously taken Xanax and is familiar with its effects. He expresses a willingness to try a different medication in the benzodiazepine class, specifically Ativan, in hopes of achieving better sleep. He understands the risks associated with this class of medication, including potential dependency and the dangers of mixing with alcohol, and expresses a commitment to use it responsibly.  In addition to his grief and insomnia, the patient also mentions a past prescription for hydrocodone, but clarifies that he is no longer taking it. He has been in contact with hospice services for support and expresses gratitude for his grandchildren, friends, and church community. He also mentions a stable financial situation, with only minor concern about a nursing home bill.     He has a past medical history of Acute hypoxemic respiratory failure (HCC) (09/18/2022), Allergy, Anxiety, Atrial fibrillation (HCC), Atrial fibrillation with RVR (HCC) (09/12/2022), Bell palsy, Depression, Diastolic heart failure, HAP (hospital-acquired pneumonia) (09/17/2022), Hypercholesterolemia, Hypertension, Hypokalemia (03/07/2023), Pneumonia, Pneumonia of right lower lobe due to infectious organism (09/17/2022), and Thyroid disease.  Problem list overviews that were updated at today's visit: No problems updated. Current Outpatient Medications on File Prior to Visit  Medication Sig   cloNIDine (CATAPRES - DOSED IN MG/24 HR) 0.2 mg/24hr patch 0.2 mg once a week.   ezetimibe (ZETIA) 10 MG tablet Take 1 tablet (10 mg  total) by mouth daily.   isosorbide mononitrate (IMDUR) 30 MG 24 hr tablet Take 1 tablet (30 mg total) by mouth daily. Cannot take levitra with.   levothyroxine (SYNTHROID) 200 MCG tablet Take 1 tablet (200 mcg total) by mouth daily. Take as recommended by endocrine.  200 mg daily except for Sunday, take 100 mg. (Patient taking differently: Take 100-200 mcg by mouth See admin instructions. Take as recommended by endocrine.  200 mg daily except for Sunday, take 100 mg.)   loratadine (CLARITIN) 10 MG tablet Take 1 tablet (10 mg total) by mouth daily. (Patient taking differently: Take 10 mg by mouth as needed.)   losartan (COZAAR) 100 MG tablet Take 1 tablet (100 mg total) by mouth daily.   metoprolol tartrate (LOPRESSOR) 50 MG tablet Take 50 mg by mouth 2 (two) times daily.   montelukast (SINGULAIR) 10 MG tablet Take 1 tablet (10 mg total) by mouth at bedtime. TAKE 1 TABLET BY MOUTH EVERYDAY AT BEDTIME Strength: 10 mg   Multiple Vitamins-Minerals (MULTIVITAMIN WITH MINERALS) tablet Take 1 tablet by mouth daily.   nicotine (NICODERM CQ - DOSED IN MG/24 HOURS) 21 mg/24hr patch Place 1 patch (21 mg total) onto the skin daily.   rivaroxaban (XARELTO) 20 MG TABS tablet Take 1 tablet (20 mg total) by mouth daily with supper.   rosuvastatin (CRESTOR) 40 MG tablet Take 1 tablet (40 mg total) by mouth daily. Replaces 20 mg dose   Saline (SIMPLY SALINE) 0.9 % AERS Place 2 each into the nose as directed. Use nightly for sinus hygiene long-term.  Can also be used as many times daily as desired to assist with clearing congested sinuses.   spironolactone (ALDACTONE) 25 MG tablet Take 1  tablet (25 mg total) by mouth daily.   tamsulosin (FLOMAX) 0.4 MG CAPS capsule Take 1 capsule (0.4 mg total) by mouth daily.   torsemide (DEMADEX) 10 MG tablet Take 10 mg by mouth daily.   Cholecalciferol (VITAMIN D-3) 125 MCG (5000 UT) TABS Take 5,000 Units by mouth daily. (Patient not taking: Reported on 06/21/2023)   doxycycline  (VIBRAMYCIN) 100 MG capsule Take 1 capsule (100 mg total) by mouth 2 (two) times daily. One po bid x 7 days (Patient not taking: Reported on 06/21/2023)   fluticasone (FLONASE) 50 MCG/ACT nasal spray Place 2 sprays into both nostrils daily. (Patient not taking: Reported on 06/21/2023)   hydrOXYzine (ATARAX) 25 MG tablet Take 1 tablet (25 mg total) by mouth 3 (three) times daily as needed. (Patient not taking: Reported on 06/21/2023)   pantoprazole (PROTONIX) 40 MG tablet Take 1 tablet (40 mg total) by mouth daily. (Patient not taking: Reported on 04/19/2023)   No current facility-administered medications on file prior to visit.   Medications Discontinued During This Encounter  Medication Reason   cloNIDine (CATAPRES - DOSED IN MG/24 HR) 0.1 mg/24hr patch    metoprolol tartrate (LOPRESSOR) 25 MG tablet      Objective   Physical Exam  BP 130/80 (BP Location: Left Arm, Patient Position: Sitting)   Pulse (!) 49   Temp 97.8 F (36.6 C) (Temporal)   Ht 5\' 9"  (1.753 m)   Wt 198 lb 12.8 oz (90.2 kg)   SpO2 97%   BMI 29.36 kg/m  Wt Readings from Last 10 Encounters:  06/24/23 198 lb 12.8 oz (90.2 kg)  06/22/23 200 lb 6.4 oz (90.9 kg)  06/21/23 202 lb 6.4 oz (91.8 kg)  05/17/23 202 lb (91.6 kg)  04/19/23 206 lb 14.4 oz (93.8 kg)  04/14/23 204 lb (92.5 kg)  03/31/23 205 lb (93 kg)  03/29/23 201 lb 4.5 oz (91.3 kg)  03/29/23 201 lb 3.2 oz (91.3 kg)  03/22/23 208 lb (94.3 kg)   Vital signs reviewed.  Nursing notes reviewed. Weight trend reviewed. Abnormalities and Problem-Specific physical exam findings:  tired, stressed, grieving - frazzled forgetful, sighing drowsy General Appearance:  No acute distress appreciable.   Well-groomed, healthy-appearing male.  Well proportioned with no abnormal fat distribution.  Good muscle tone. Pulmonary:  Normal work of breathing at rest, no respiratory distress apparent. SpO2: 97 %  Neurological:  Awake, alert, oriented, and engaged.  No obvious focal  neurological deficits or cognitive impairments.  Sensorium seems unclouded.   Speech is clear and coherent with logical content. Psychiatric:  Appropriate mood, pleasant and cooperative demeanor, thoughtful and engaged during the exam  Results            No results found for any visits on 06/24/23.  Lab on 06/18/2023  Component Date Value   TSH 06/18/2023 0.69   Office Visit on 04/14/2023  Component Date Value   Color, Urine 04/14/2023 YELLOW    APPearance 04/14/2023 Sl Cloudy (A)    Specific Gravity, Urine 04/14/2023 1.015    pH 04/14/2023 6.0    Total Protein, Urine 04/14/2023 NEGATIVE    Urine Glucose 04/14/2023 NEGATIVE    Ketones, ur 04/14/2023 NEGATIVE    Bilirubin Urine 04/14/2023 NEGATIVE    Hgb urine dipstick 04/14/2023 MODERATE (A)    Urobilinogen, UA 04/14/2023 0.2    Leukocytes,Ua 04/14/2023 NEGATIVE    Nitrite 04/14/2023 NEGATIVE    WBC, UA 04/14/2023 0-2/hpf    RBC / HPF 04/14/2023 0-2/hpf    Amorphous  04/14/2023 Present (A)    Sodium 04/14/2023 140    Potassium 04/14/2023 4.3    Chloride 04/14/2023 103    CO2 04/14/2023 31    Glucose, Bld 04/14/2023 89    BUN 04/14/2023 12    Creatinine, Ser 04/14/2023 1.43    GFR 04/14/2023 51.84 (L)    Calcium 04/14/2023 9.0    WBC 04/14/2023 9.8    RBC 04/14/2023 5.13    Hemoglobin 04/14/2023 15.5    HCT 04/14/2023 47.4    MCV 04/14/2023 92.4    MCHC 04/14/2023 32.7    RDW 04/14/2023 16.5 (H)    Platelets 04/14/2023 235.0    Neutrophils Relative % 04/14/2023 68.3    Lymphocytes Relative 04/14/2023 18.6    Monocytes Relative 04/14/2023 9.6    Eosinophils Relative 04/14/2023 2.6    Basophils Relative 04/14/2023 0.9    Neutro Abs 04/14/2023 6.7    Lymphs Abs 04/14/2023 1.8    Monocytes Absolute 04/14/2023 0.9    Eosinophils Absolute 04/14/2023 0.3    Basophils Absolute 04/14/2023 0.1   Office Visit on 03/31/2023  Component Date Value   Color, Urine 03/31/2023 YELLOW    APPearance 03/31/2023 CLEAR    Specific  Gravity, Urine 03/31/2023 1.025    pH 03/31/2023 6.5    Total Protein, Urine 03/31/2023 NEGATIVE    Urine Glucose 03/31/2023 NEGATIVE    Ketones, ur 03/31/2023 NEGATIVE    Bilirubin Urine 03/31/2023 NEGATIVE    Hgb urine dipstick 03/31/2023 NEGATIVE    Urobilinogen, UA 03/31/2023 4.0 (A)    Leukocytes,Ua 03/31/2023 NEGATIVE    Nitrite 03/31/2023 NEGATIVE    WBC, UA 03/31/2023 3-6/hpf (A)    RBC / HPF 03/31/2023 none seen    Mucus, UA 03/31/2023 Presence of (A)    Squamous Epithelial / HPF 03/31/2023 Rare(0-4/hpf)   Admission on 03/29/2023, Discharged on 03/29/2023  Component Date Value   Lipase 03/29/2023 36    Sodium 03/29/2023 137    Potassium 03/29/2023 3.7    Chloride 03/29/2023 103    CO2 03/29/2023 27    Glucose, Bld 03/29/2023 90    BUN 03/29/2023 12    Creatinine, Ser 03/29/2023 1.05    Calcium 03/29/2023 9.0    Total Protein 03/29/2023 7.1    Albumin 03/29/2023 4.3    AST 03/29/2023 18    ALT 03/29/2023 25    Alkaline Phosphatase 03/29/2023 85    Total Bilirubin 03/29/2023 0.7    GFR, Estimated 03/29/2023 >60    Anion gap 03/29/2023 7    WBC 03/29/2023 13.2 (H)    RBC 03/29/2023 5.56    Hemoglobin 03/29/2023 16.6    HCT 03/29/2023 50.5    MCV 03/29/2023 90.8    MCH 03/29/2023 29.9    MCHC 03/29/2023 32.9    RDW 03/29/2023 15.2    Platelets 03/29/2023 235    nRBC 03/29/2023 0.0    Color, Urine 03/29/2023 YELLOW    APPearance 03/29/2023 CLEAR    Specific Gravity, Urine 03/29/2023 1.032 (H)    pH 03/29/2023 6.0    Glucose, UA 03/29/2023 NEGATIVE    Hgb urine dipstick 03/29/2023 SMALL (A)    Bilirubin Urine 03/29/2023 NEGATIVE    Ketones, ur 03/29/2023 NEGATIVE    Protein, ur 03/29/2023 30 (A)    Nitrite 03/29/2023 NEGATIVE    Leukocytes,Ua 03/29/2023 NEGATIVE    RBC / HPF 03/29/2023 0-5    WBC, UA 03/29/2023 0-5    Bacteria, UA 03/29/2023 NONE SEEN    Squamous Epithelial / HPF 03/29/2023 0-5  Mucus 03/29/2023 PRESENT    Hyaline Casts, UA 03/29/2023  PRESENT   Admission on 03/22/2023, Discharged on 03/22/2023  Component Date Value   Activated Clotting Time 03/22/2023 293    Activated Clotting Time 03/22/2023 330   Lab on 03/08/2023  Component Date Value   Glucose 03/08/2023 142 (H)    BUN 03/08/2023 12    Creatinine, Ser 03/08/2023 1.23    eGFR 03/08/2023 66    BUN/Creatinine Ratio 03/08/2023 10    Sodium 03/08/2023 141    Potassium 03/08/2023 4.0    Chloride 03/08/2023 99    CO2 03/08/2023 25    Calcium 03/08/2023 8.7    WBC 03/08/2023 10.0    RBC 03/08/2023 5.82 (H)    Hemoglobin 03/08/2023 16.9    Hematocrit 03/08/2023 53.8 (H)    MCV 03/08/2023 92    MCH 03/08/2023 29.0    MCHC 03/08/2023 31.4 (L)    RDW 03/08/2023 14.4    Platelets 03/08/2023 209   Office Visit on 03/04/2023  Component Date Value   Hemoglobin A1C 03/04/2023 6.1 (A)    POC Glucose 03/04/2023 134 (A)   Lab on 03/01/2023  Component Date Value   Free T4 03/01/2023 1.12    TSH 03/01/2023 8.05 (H)    Sodium 03/01/2023 136    Potassium 03/01/2023 3.2 (L)    Chloride 03/01/2023 94 (L)    CO2 03/01/2023 31    Glucose, Bld 03/01/2023 182 (H)    BUN 03/01/2023 14    Creatinine, Ser 03/01/2023 1.30    GFR 03/01/2023 58.18 (L)    Calcium 03/01/2023 9.0   Office Visit on 02/25/2023  Component Date Value   Sodium 02/25/2023 137    Potassium 02/25/2023 4.2    Chloride 02/25/2023 100    CO2 02/25/2023 29    Glucose, Bld 02/25/2023 119 (H)    BUN 02/25/2023 16    Creatinine, Ser 02/25/2023 1.15    GFR 02/25/2023 67.40    Calcium 02/25/2023 8.9    Aldosterone 02/25/2023 3    Renin Activity 02/25/2023 0.12 (L)    ALDO / PRA Ratio 02/25/2023 25.0    Total Volume 03/01/2023 1,500    METANEPHRINE 03/01/2023 59 (L)    NORMETANEPHRINE 03/01/2023 249    METANEPHRINES, TOTAL 03/01/2023 308   Lab on 11/30/2022  Component Date Value   Free T4 11/30/2022 1.52    TSH 11/30/2022 0.22 (L)    Sodium 11/30/2022 139    Potassium 11/30/2022 3.9    Chloride  11/30/2022 100    CO2 11/30/2022 30    Glucose, Bld 11/30/2022 138 (H)    BUN 11/30/2022 12    Creatinine, Ser 11/30/2022 1.11    GFR 11/30/2022 70.44    Calcium 11/30/2022 9.1   There may be more visits with results that are not included.   No image results found.   US Renal  Result Date: 05/03/2023 CLINICAL DATA:  Hematuria.  Proteinuria.  Acute kidney injury. EXAM: RENAL / URINARY TRACT ULTRASOUND COMPLETE COMPARISON:  Abdomen and pelvis CT dated 03/29/2023 FINDINGS: Right Kidney: Renal measurements: 11.3 x 5.6 x 5.6 cm. Normal echotexture. No hydronephrosis. 3.1 cm simple cyst. Left Kidney: Renal measurements: 12.6 x 5.4 x 4.8 cm. Normal echotexture. No hydronephrosis. 1.9 cm simple cyst. Bladder: Prostate gland protruding into the base of the urinary bladder with no bladder calculi seen. Other: None. IMPRESSION: 1. No hydronephrosis. 2. Bilateral simple renal cysts. These do not need imaging follow-up. 3. Prostate gland protruding into the base of  the urinary bladder. Electronically Signed   By: Beckie Salts M.D.   On: 05/03/2023 21:36   CT ABDOMEN PELVIS W CONTRAST  Result Date: 03/29/2023 CLINICAL DATA:  Abdominal pain EXAM: CT ABDOMEN AND PELVIS WITH CONTRAST TECHNIQUE: Multidetector CT imaging of the abdomen and pelvis was performed using the standard protocol following bolus administration of intravenous contrast. RADIATION DOSE REDUCTION: This exam was performed according to the departmental dose-optimization program which includes automated exposure control, adjustment of the mA and/or kV according to patient size and/or use of iterative reconstruction technique. CONTRAST:  85mL OMNIPAQUE IOHEXOL 300 MG/ML  SOLN COMPARISON:  CT abdomen and pelvis dated August 21st 2022 FINDINGS: Lower chest: Emphysema. Elevation of the right hemidiaphragm and right lower lobe atelectasis. Trace right effusion. Hepatobiliary: No focal liver abnormality is seen. Status post cholecystectomy. No biliary  dilatation. Pancreas: Unremarkable. No pancreatic ductal dilatation or surrounding inflammatory changes. Spleen: Normal in size without focal abnormality. Adrenals/Urinary Tract: Bilateral adrenal glands are unremarkable. Hydronephrosis or nephrolithiasis. Simple ovarian cyst of the right kidney, no specific follow-up imaging is necessary. Bladder wall thickening. Stomach/Bowel: Stomach is within normal limits. Mild diverticulosis. No evidence of bowel wall thickening, distention, or inflammatory changes. Vascular/Lymphatic: Aortic atherosclerosis. No enlarged abdominal or pelvic lymph nodes. Reproductive: Mild Prostatomegaly. Other: No abdominal wall hernia or abnormality. No abdominopelvic ascites. Musculoskeletal: No acute or significant osseous findings. IMPRESSION: 1. No acute findings in the abdomen or pelvis. 2. Similar bladder wall thickening, likely due to chronic outlet obstruction given associated prostatomegaly. 3. Trace right effusion. 4. Aortic Atherosclerosis (ICD10-I70.0) and Emphysema (ICD10-J43.9). Electronically Signed   By: Allegra Lai M.D.   On: 03/29/2023 19:43    No results found.     Additional Info: This encounter employed real-time, collaborative documentation. The patient actively reviewed and updated their medical record on a shared screen, ensuring transparency and facilitating joint problem-solving for the problem list, overview, and plan. This approach promotes accurate, informed care. The treatment plan was discussed and reviewed in detail, including medication safety, potential side effects, and all patient questions. We confirmed understanding and comfort with the plan. Follow-up instructions were established, including contacting the office for any concerns, returning if symptoms worsen, persist, or new symptoms develop, and precautions for potential emergency department visits.

## 2023-06-24 NOTE — Patient Instructions (Signed)
VISIT SUMMARY:  During your visit, we discussed your feelings of grief and insomnia following the recent loss of your spouse. We explored different treatment options and decided to try Ativan, a medication that can help with sleep and anxiety. We also talked about the importance of your support system, including your grandchildren, friends, and church community.  YOUR PLAN:  -GRIEF AND INSOMNIA: You're experiencing difficulty sleeping and emotional distress due to your recent loss. We've decided to try Ativan, a medication that can help with sleep and anxiety. We'll start with a small dose and see how it works for you. We'll review this in a few weeks.  -SUPPORT SYSTEM: You have a strong support system in place, including your grandchildren, friends, and church community. It's important to continue engaging with them and the hospice services you're currently receiving.  INSTRUCTIONS:  We will schedule a follow-up appointment in 3 months, or sooner if needed. Please take Ativan 1mg  at bedtime for sleep and anxiety, starting with half the dose to see how it affects you. Remember to continue engaging with your support system and hospice services.

## 2023-07-04 ENCOUNTER — Other Ambulatory Visit: Payer: Self-pay | Admitting: Family Medicine

## 2023-07-04 DIAGNOSIS — N319 Neuromuscular dysfunction of bladder, unspecified: Secondary | ICD-10-CM

## 2023-07-24 ENCOUNTER — Other Ambulatory Visit (HOSPITAL_COMMUNITY): Payer: Self-pay | Admitting: Physician Assistant

## 2023-07-26 ENCOUNTER — Other Ambulatory Visit: Payer: Self-pay | Admitting: Family Medicine

## 2023-07-26 DIAGNOSIS — J449 Chronic obstructive pulmonary disease, unspecified: Secondary | ICD-10-CM

## 2023-08-04 ENCOUNTER — Telehealth: Payer: Self-pay | Admitting: Internal Medicine

## 2023-08-04 NOTE — Telephone Encounter (Signed)
Patient Request Refill:  RX: ALBUTEROL SULFATE 108 (Base) mcg/act LAST WRITTEN: 01/18/2017 by ED provider  SYMPTOMS: Pt reports, head gets stopped up at night and in the morning. Pt reports no difficulty breathing.  Refill Denied by Dr. Karyl Kinnier @ [facility] [place] on 07/26/2023  Preferred Pharmacy: [facility] [place]  Patient's Phone: [phone]

## 2023-08-06 ENCOUNTER — Other Ambulatory Visit: Payer: Self-pay

## 2023-08-06 DIAGNOSIS — J449 Chronic obstructive pulmonary disease, unspecified: Secondary | ICD-10-CM

## 2023-08-06 MED ORDER — ALBUTEROL SULFATE HFA 108 (90 BASE) MCG/ACT IN AERS
2.0000 | INHALATION_SPRAY | Freq: Four times a day (QID) | RESPIRATORY_TRACT | 0 refills | Status: DC | PRN
Start: 2023-08-06 — End: 2023-08-06

## 2023-08-06 MED ORDER — ALBUTEROL SULFATE HFA 108 (90 BASE) MCG/ACT IN AERS
2.0000 | INHALATION_SPRAY | Freq: Four times a day (QID) | RESPIRATORY_TRACT | 3 refills | Status: DC | PRN
Start: 2023-08-06 — End: 2023-10-12

## 2023-08-06 NOTE — Telephone Encounter (Signed)
Sent prescription to requested pharmacy.

## 2023-08-09 ENCOUNTER — Other Ambulatory Visit: Payer: Self-pay

## 2023-08-09 DIAGNOSIS — E890 Postprocedural hypothyroidism: Secondary | ICD-10-CM

## 2023-08-09 MED ORDER — LEVOTHYROXINE SODIUM 200 MCG PO TABS
200.0000 ug | ORAL_TABLET | Freq: Every day | ORAL | 3 refills | Status: DC
Start: 2023-08-09 — End: 2023-10-22

## 2023-08-15 ENCOUNTER — Other Ambulatory Visit: Payer: Self-pay

## 2023-08-23 ENCOUNTER — Other Ambulatory Visit: Payer: Self-pay

## 2023-08-23 DIAGNOSIS — F17210 Nicotine dependence, cigarettes, uncomplicated: Secondary | ICD-10-CM

## 2023-08-23 DIAGNOSIS — N179 Acute kidney failure, unspecified: Secondary | ICD-10-CM

## 2023-08-23 DIAGNOSIS — R809 Proteinuria, unspecified: Secondary | ICD-10-CM

## 2023-08-23 DIAGNOSIS — R3121 Asymptomatic microscopic hematuria: Secondary | ICD-10-CM

## 2023-08-23 DIAGNOSIS — N1831 Chronic kidney disease, stage 3a: Secondary | ICD-10-CM

## 2023-08-23 DIAGNOSIS — N182 Chronic kidney disease, stage 2 (mild): Secondary | ICD-10-CM

## 2023-08-30 ENCOUNTER — Other Ambulatory Visit: Payer: Self-pay

## 2023-09-02 ENCOUNTER — Other Ambulatory Visit: Payer: Self-pay | Admitting: Internal Medicine

## 2023-09-02 DIAGNOSIS — N529 Male erectile dysfunction, unspecified: Secondary | ICD-10-CM

## 2023-09-03 ENCOUNTER — Telehealth: Payer: Self-pay | Admitting: Cardiovascular Disease

## 2023-09-03 ENCOUNTER — Other Ambulatory Visit: Payer: Self-pay | Admitting: Internal Medicine

## 2023-09-03 DIAGNOSIS — I480 Paroxysmal atrial fibrillation: Secondary | ICD-10-CM

## 2023-09-03 DIAGNOSIS — I482 Chronic atrial fibrillation, unspecified: Secondary | ICD-10-CM

## 2023-09-03 NOTE — Telephone Encounter (Signed)
Xarelto 20mg  refill request received. Pt is 62 years old, weight-90.2kg, Crea-1.43 on 04/14/23, last seen by Dr. Nelly Laurence on 06/22/23, Diagnosis-Afib, CrCl-66.58 mL/min; Dose is appropriate based on dosing criteria. Will send in refill to requested pharmacy.

## 2023-09-03 NOTE — Telephone Encounter (Signed)
*  STAT* If patient is at the pharmacy, call can be transferred to refill team.   1. Which medications need to be refilled? (please list name of each medication and dose if known) rivaroxaban (XARELTO) 20 MG TABS tablet    2. Would you like to learn more about the convenience, safety, & potential cost savings by using the [facility]?     3. Are you open to using the [facility] (Type [facility]. ).   4. Which pharmacy/location (including street and city if local pharmacy) is medication to be sent to? [id number] [place], [place] - [address]    5. Do they need a 30 day or 90 day supply? 30

## 2023-09-06 MED ORDER — RIVAROXABAN 20 MG PO TABS
20.0000 mg | ORAL_TABLET | Freq: Every day | ORAL | 5 refills | Status: DC
Start: 2023-09-06 — End: 2024-06-05

## 2023-09-06 NOTE — Telephone Encounter (Signed)
 Pt is requesting [facility]

## 2023-09-06 NOTE — Telephone Encounter (Signed)
Prescription refill request for Xarelto received.  Indication:afib Last office visit:10/24 Weight:90.2  kg Age:62 Scr:1.43  7/24 CrCl:66.58  ml/min  Prescription refilled

## 2023-09-09 ENCOUNTER — Other Ambulatory Visit: Payer: Self-pay | Admitting: Internal Medicine

## 2023-09-09 NOTE — Telephone Encounter (Signed)
Patient is scheduled to come in for an follow-up visit on 09/24/23. Refill can be sent in then (when cholesterol levels are evaluated again).

## 2023-09-22 ENCOUNTER — Ambulatory Visit: Payer: 59 | Admitting: Podiatry

## 2023-09-24 ENCOUNTER — Ambulatory Visit: Payer: 59 | Admitting: Internal Medicine

## 2023-10-10 ENCOUNTER — Other Ambulatory Visit: Payer: Self-pay | Admitting: Internal Medicine

## 2023-10-10 DIAGNOSIS — J449 Chronic obstructive pulmonary disease, unspecified: Secondary | ICD-10-CM

## 2023-10-10 DIAGNOSIS — I10 Essential (primary) hypertension: Secondary | ICD-10-CM

## 2023-10-11 ENCOUNTER — Encounter: Payer: Self-pay | Admitting: Internal Medicine

## 2023-10-11 ENCOUNTER — Ambulatory Visit: Payer: 59 | Admitting: Internal Medicine

## 2023-10-11 ENCOUNTER — Other Ambulatory Visit: Payer: Self-pay

## 2023-10-11 VITALS — BP 117/79 | HR 63 | Temp 97.2°F | Ht 69.0 in | Wt 206.8 lb

## 2023-10-11 DIAGNOSIS — Z634 Disappearance and death of family member: Secondary | ICD-10-CM

## 2023-10-11 DIAGNOSIS — N1831 Chronic kidney disease, stage 3a: Secondary | ICD-10-CM

## 2023-10-11 DIAGNOSIS — E559 Vitamin D deficiency, unspecified: Secondary | ICD-10-CM | POA: Diagnosis not present

## 2023-10-11 DIAGNOSIS — F418 Other specified anxiety disorders: Secondary | ICD-10-CM | POA: Diagnosis not present

## 2023-10-11 DIAGNOSIS — E782 Mixed hyperlipidemia: Secondary | ICD-10-CM | POA: Diagnosis not present

## 2023-10-11 DIAGNOSIS — F17210 Nicotine dependence, cigarettes, uncomplicated: Secondary | ICD-10-CM

## 2023-10-11 DIAGNOSIS — R311 Benign essential microscopic hematuria: Secondary | ICD-10-CM

## 2023-10-11 DIAGNOSIS — M1A09X0 Idiopathic chronic gout, multiple sites, without tophus (tophi): Secondary | ICD-10-CM

## 2023-10-11 DIAGNOSIS — I2510 Atherosclerotic heart disease of native coronary artery without angina pectoris: Secondary | ICD-10-CM

## 2023-10-11 DIAGNOSIS — J320 Chronic maxillary sinusitis: Secondary | ICD-10-CM

## 2023-10-11 DIAGNOSIS — R3129 Other microscopic hematuria: Secondary | ICD-10-CM

## 2023-10-11 DIAGNOSIS — I739 Peripheral vascular disease, unspecified: Secondary | ICD-10-CM

## 2023-10-11 DIAGNOSIS — E039 Hypothyroidism, unspecified: Secondary | ICD-10-CM

## 2023-10-11 DIAGNOSIS — D509 Iron deficiency anemia, unspecified: Secondary | ICD-10-CM

## 2023-10-11 DIAGNOSIS — F4321 Adjustment disorder with depressed mood: Secondary | ICD-10-CM

## 2023-10-11 DIAGNOSIS — K1321 Leukoplakia of oral mucosa, including tongue: Secondary | ICD-10-CM

## 2023-10-11 DIAGNOSIS — I1A0 Resistant hypertension: Secondary | ICD-10-CM

## 2023-10-11 DIAGNOSIS — J449 Chronic obstructive pulmonary disease, unspecified: Secondary | ICD-10-CM

## 2023-10-11 DIAGNOSIS — G4709 Other insomnia: Secondary | ICD-10-CM

## 2023-10-11 DIAGNOSIS — I480 Paroxysmal atrial fibrillation: Secondary | ICD-10-CM

## 2023-10-11 DIAGNOSIS — R809 Proteinuria, unspecified: Secondary | ICD-10-CM

## 2023-10-11 DIAGNOSIS — R590 Localized enlarged lymph nodes: Secondary | ICD-10-CM

## 2023-10-11 LAB — CBC WITH DIFFERENTIAL/PLATELET
Basophils Absolute: 0 10*3/uL (ref 0.0–0.1)
Basophils Relative: 0.2 % (ref 0.0–3.0)
Eosinophils Absolute: 0.2 10*3/uL (ref 0.0–0.7)
Eosinophils Relative: 1 % (ref 0.0–5.0)
HCT: 47.9 % (ref 39.0–52.0)
Hemoglobin: 16.1 g/dL (ref 13.0–17.0)
Lymphocytes Relative: 14.1 % (ref 12.0–46.0)
Lymphs Abs: 2.5 10*3/uL (ref 0.7–4.0)
MCHC: 33.6 g/dL (ref 30.0–36.0)
MCV: 89.4 fL (ref 78.0–100.0)
Monocytes Absolute: 1.3 10*3/uL — ABNORMAL HIGH (ref 0.1–1.0)
Monocytes Relative: 7.2 % (ref 3.0–12.0)
Neutro Abs: 13.6 10*3/uL — ABNORMAL HIGH (ref 1.4–7.7)
Neutrophils Relative %: 77.5 % — ABNORMAL HIGH (ref 43.0–77.0)
Platelets: 207 10*3/uL (ref 150.0–400.0)
RBC: 5.36 Mil/uL (ref 4.22–5.81)
RDW: 14 % (ref 11.5–15.5)
WBC: 17.5 10*3/uL — ABNORMAL HIGH (ref 4.0–10.5)

## 2023-10-11 LAB — COMPREHENSIVE METABOLIC PANEL
ALT: 16 U/L (ref 0–53)
AST: 18 U/L (ref 0–37)
Albumin: 4.4 g/dL (ref 3.5–5.2)
Alkaline Phosphatase: 96 U/L (ref 39–117)
BUN: 12 mg/dL (ref 6–23)
CO2: 30 meq/L (ref 19–32)
Calcium: 9.1 mg/dL (ref 8.4–10.5)
Chloride: 98 meq/L (ref 96–112)
Creatinine, Ser: 1 mg/dL (ref 0.40–1.50)
GFR: 79.36 mL/min (ref >60.00)
Glucose, Bld: 114 mg/dL — ABNORMAL HIGH (ref 70–99)
Potassium: 3.9 meq/L (ref 3.5–5.1)
Sodium: 135 meq/L (ref 135–145)
Total Bilirubin: 0.4 mg/dL (ref 0.2–1.2)
Total Protein: 7.2 g/dL (ref 6.0–8.3)

## 2023-10-11 LAB — LIPID PANEL
Cholesterol: 206 mg/dL — ABNORMAL HIGH (ref 0–200)
HDL: 42.4 mg/dL (ref >39.00)
LDL Cholesterol: 116 mg/dL — ABNORMAL HIGH (ref 0–99)
NonHDL: 164.07
Total CHOL/HDL Ratio: 5
Triglycerides: 239 mg/dL — ABNORMAL HIGH (ref 0.0–149.0)
VLDL: 47.8 mg/dL — ABNORMAL HIGH (ref 0.0–40.0)

## 2023-10-11 LAB — MAGNESIUM: Magnesium: 2 mg/dL (ref 1.5–2.5)

## 2023-10-11 LAB — PHOSPHORUS: Phosphorus: 2.7 mg/dL (ref 2.3–4.6)

## 2023-10-11 LAB — URIC ACID: Uric Acid, Serum: 4.1 mg/dL (ref 4.0–7.8)

## 2023-10-11 LAB — VITAMIN D 25 HYDROXY (VIT D DEFICIENCY, FRACTURES): VITD: 38.2 ng/mL (ref 30.00–100.00)

## 2023-10-11 MED ORDER — LORAZEPAM 1 MG PO TABS: 1.0000 mg | ORAL_TABLET | Freq: Two times a day (BID) | ORAL | 0 refills | Status: DC | PRN

## 2023-10-11 MED ORDER — ROSUVASTATIN CALCIUM 40 MG PO TABS: 40.0000 mg | ORAL_TABLET | Freq: Every day | ORAL | 3 refills | Status: AC

## 2023-10-11 MED ORDER — AMOXICILLIN-POT CLAVULANATE 875-125 MG PO TABS: 1.0000 | ORAL_TABLET | Freq: Two times a day (BID) | ORAL | 0 refills | Status: AC

## 2023-10-11 NOTE — Progress Notes (Signed)
==============================  [facility]: [phone]   -- Medical Office Visit --  Patient: Jesse DELAUDER [name]      Age: 63 y.o.       Sex:  male  Date:   10/11/2023 Today's Healthcare Provider: Lula Olszewski, MD  ==============================   CHIEF COMPLAINT: 3 month follow-up, Productive cough (Producing green mucus, when blowing nose producing brown and green mucus.), and Medication Refill (Albuterol inhaler and rosuvastatin)  SUBJECTIVE: Background This is a 63 y.o. male who has Atrial fibrillation (paroxysmal with history of RVR; Resistant hypertension; Depression with anxiety; ED (erectile dysfunction); Hx of Bell's palsy; Amputated toe (HCC); Cigarette smoker; Bone neoplasm; PVC's (premature ventricular contractions); COPD GOLD 0/ emphysema on CT ; Vitamin D deficiency; Iron deficiency anemia; Tobacco use disorder; Mood disorder (HCC); Prolonged QT interval; Lumbar degenerative disc disease; Bladder dysfunction; Seasonal allergies; Major depressive disorder, single episode, in remission (HCC); Gout; Hypothyroidism; Hyperlipidemia; CAD (coronary artery disease); Prediabetes; Peripheral arterial disease (HCC); Diverticular disease; Thoracic spondylosis; Microscopic hematuria; Aortic atherosclerosis (HCC); Obesity due to energy imbalance; Epigastric pain; Chronic kidney disease, stage 3a (HCC); Hematuria; Proteinuria; Hypercoagulable state due to persistent atrial fibrillation (HCC); and BPH (benign prostatic hyperplasia) on their problem list.  History of Present Illness The patient, a 63 year old with a history of smoking, thyroid disease, and mild kidney disease, presents with a one-week history of worsening cough and sinus congestion. He reports coughing up brownish sputum and experiencing nocturnal coughing that disrupts his sleep. The patient denies experiencing shortness of breath, body aches, or fevers, but mentions feeling cold, which  he attributes to recent exposure to cold and wet conditions.  The patient also reports a history of Bell's palsy, which has resulted in facial asymmetry. He has been using lorazepam, approximately half a tablet during the day and one at night, to manage grief. The patient mentions that despite the lorazepam, the coughing still disrupts his sleep.  In addition to these symptoms, the patient has been experiencing urinary issues. He reports that a kidney center has not followed up with him, and that a recent consultation with Dr. Wilson Singer revealed no blood in his urine. Dr. Wilson Singer has scheduled a bladder examination for the following month.  The patient expresses a desire to quit smoking and has plans to try nicotine patches. He also mentions a large lymph node at the angle of his left jaw, which he was not previously aware of. The patient denies any abdominal pain. He is currently on medication for his heart and thyroid, and also takes a vitamin D supplement. He has a history of iron deficiency anemia and protein in his urine. The patient's blood pressure is well-controlled on medication.   Reviewed chart records that patient  has a past medical history of Abdominal pain (03/31/2023), Acute hypoxemic respiratory failure (HCC) (09/18/2022), Acute kidney injury (HCC) (05/09/2022), Allergy, Anxiety, Atrial fibrillation (HCC), Atrial fibrillation with RVR (HCC) (09/12/2022), Bell palsy, Depression, Diastolic heart failure, HAP (hospital-acquired pneumonia) (09/17/2022), Hypercholesterolemia, Hypertension, Hypokalemia (03/07/2023), Pneumonia, Pneumonia of right lower lobe due to infectious organism (09/17/2022), and Thyroid disease.  Discussed Past Medical History - Grief - Mild kidney disease - Emphysema - Depression - Bell's palsy - Chronic kidney disease - Iron deficiency anemia - Thyroid problem    Problem list overviews that were updated at today's visit: Problem  Abdominal Pain (Resolved)    Epigastric and diffuse, emergency room visit did excellent evaluation. Seems to be improving with protonix and hold colchicine.  Leucocytosis noted   Acute Kidney Injury (Hcc) (Resolved)   Microscopic hematuria (R31.9): Updated 04/14/23. Moderate blood in urine. Renal ultrasound and specialist referrals ordered. AKI vs Chronic kidney disease, stage 3a (N18.31): New diagnosis 04/14/23. GFR 51.84, creatinine 1.43. Nephrology referral placed. Benign prostatic hyperplasia (N40.1): Needs follow-up. Last seen by urology several years ago. Urology referral placed for re-evaluation. Proteinuria present Lab Results  Component Value Date/Time   CREATININE 1.43 04/14/2023 02:56 PM   CREATININE 1.05 03/29/2023 05:26 PM   CREATININE 1.23 03/08/2023 11:04 AM   CREATININE 1.30 03/01/2023 10:09 AM   CREATININE 1.15 02/25/2023 03:37 PM   CREATININE 0.98 01/06/2017 04:17 PM   CREATININE 0.84 05/22/2016 01:08 PM   CREATININE 0.92 03/22/2015 10:07 AM   CREATININE 1.02 08/07/2013 06:14 PM   CREATININE 0.88 01/18/2013 06:04 PM   Lab Results  Component Value Date/Time   GFR 51.84 (L) 04/14/2023 02:56 PM   GFR 58.18 (L) 03/01/2023 10:09 AM   GFR 67.40 02/25/2023 03:37 PM   GFR 70.44 11/30/2022 10:29 AM   GFR 85.06 08/31/2022 09:23 AM     Tobacco Use   Smoking status: Every Day    Current packs/day: 0.50    Average packs/day: 0.5 packs/day for 44.6 years (22.3 ttl pk-yrs)    Types: Cigarettes    Start date: 09/14/1978   Smokeless tobacco: Never      Lab Results  Component Value Date   HGBUR MODERATE (A) 04/14/2023   HGBUR NEGATIVE 03/31/2023   HGBUR SMALL (A) 03/29/2023   HGBUR NEGATIVE 05/09/2022   HGBUR NEGATIVE 03/31/2022   HGBUR NEGATIVE 01/06/2022   HGBUR NEGATIVE 05/13/2021   HGBUR NEGATIVE 05/07/2018   HGBUR NEGATIVE 02/03/2018   HGBUR TRACE (A) 02/20/2016   Urine Cytology Urine Culture Proteinuria Lab Results  Component Value Date   WBC 9.8 04/14/2023   HGB 15.5 04/14/2023    HCT 47.4 04/14/2023   MCV 92.4 04/14/2023   PLT 235.0 04/14/2023   Lab Results  Component Value Date   PSA 3.05 03/31/2022   PSA 3.99 08/13/2020   PSA 3.26 02/03/2018   Lab Results  Component Value Date/Time   CREATININE 1.43 04/14/2023 02:56 PM   CREATININE 1.05 03/29/2023 05:26 PM   CREATININE 1.23 03/08/2023 11:04 AM   CREATININE 1.30 03/01/2023 10:09 AM   CREATININE 1.15 02/25/2023 03:37 PM   CREATININE 0.98 01/06/2017 04:17 PM   CREATININE 0.84 05/22/2016 01:08 PM   CREATININE 0.92 03/22/2015 10:07 AM   CREATININE 1.02 08/07/2013 06:14 PM   CREATININE 0.88 01/18/2013 06:04 PM   Lab Results  Component Value Date/Time   GFR 51.84 (L) 04/14/2023 02:56 PM   GFR 58.18 (L) 03/01/2023 10:09 AM   GFR 67.40 02/25/2023 03:37 PM   GFR 70.44 11/30/2022 10:29 AM   GFR 85.06 08/31/2022 09:23 AM   No results found for: "LABMICR", "MICROALBUR" No results found for the last 90 days.  Lab Results  Component Value Date   INR 1.4 (H) 09/17/2022   INR 1.9 (H) 09/14/2022   INR 1.02 06/22/2017   Urinalysis    Component Value Date/Time   COLORURINE YELLOW 04/14/2023 1456   APPEARANCEUR Sl Cloudy (A) 04/14/2023 1456   LABSPEC 1.015 04/14/2023 1456   PHURINE 6.0 04/14/2023 1456   GLUCOSEU NEGATIVE 04/14/2023 1456   HGBUR MODERATE (A) 04/14/2023 1456   BILIRUBINUR NEGATIVE 04/14/2023 1456   BILIRUBINUR negative 05/22/2016 1323   BILIRUBINUR neg 03/22/2015 1017   KETONESUR NEGATIVE 04/14/2023 1456   PROTEINUR 30 (A) 03/29/2023 1728  UROBILINOGEN 0.2 04/14/2023 1456   NITRITE NEGATIVE 04/14/2023 1456   LEUKOCYTESUR NEGATIVE 04/14/2023 1456        Today's Verbally Confirmed Medications - Lorazepam - Aspirin Current Outpatient Medications on File Prior to Visit  Medication Sig   albuterol (VENTOLIN HFA) 108 (90 Base) MCG/ACT inhaler Inhale 2 puffs into the lungs every 6 (six) hours as needed for wheezing or shortness of breath.   Cholecalciferol (VITAMIN D-3) 125 MCG (5000  UT) TABS Take 5,000 Units by mouth daily.   cloNIDine (CATAPRES - DOSED IN MG/24 HR) 0.2 mg/24hr patch 0.2 mg once a week.   EDEX 20 MCG injection SMARTSIG:1 Milliliter(s) Intracavernosally Every Other Day PRN   ezetimibe (ZETIA) 10 MG tablet Take 1 tablet (10 mg total) by mouth daily.   levothyroxine (SYNTHROID) 200 MCG tablet Take 1 tablet (200 mcg total) by mouth daily. Take as recommended by endocrine.  200 mg daily except for Sunday, take 100 mg.   loratadine (CLARITIN) 10 MG tablet Take 1 tablet (10 mg total) by mouth daily. (Patient taking differently: Take 10 mg by mouth as needed.)   losartan (COZAAR) 100 MG tablet Take 1 tablet (100 mg total) by mouth daily.   metoprolol tartrate (LOPRESSOR) 50 MG tablet Take 50 mg by mouth 2 (two) times daily.   montelukast (SINGULAIR) 10 MG tablet Take 1 tablet (10 mg total) by mouth at bedtime. TAKE 1 TABLET BY MOUTH EVERYDAY AT BEDTIME Strength: 10 mg   Multiple Vitamins-Minerals (MULTIVITAMIN WITH MINERALS) tablet Take 1 tablet by mouth daily.   rivaroxaban (XARELTO) 20 MG TABS tablet Take 1 tablet (20 mg total) by mouth daily with supper.   Saline (SIMPLY SALINE) 0.9 % AERS Place 2 each into the nose as directed. Use nightly for sinus hygiene long-term.  Can also be used as many times daily as desired to assist with clearing congested sinuses.   spironolactone (ALDACTONE) 25 MG tablet Take 1 tablet (25 mg total) by mouth daily.   tamsulosin (FLOMAX) 0.4 MG CAPS capsule TAKE 2 CAPSULES BY MOUTH EVERY DAY   torsemide (DEMADEX) 10 MG tablet Take 10 mg by mouth daily.   vardenafil (LEVITRA) 20 MG tablet TAKE 1 TABLET BY MOUTH EVERY DAY AS NEEDED FOR ERECTILE DYSFUNCTION   No current facility-administered medications on file prior to visit.   Medications Discontinued During This Encounter  Medication Reason   fluticasone (FLONASE) 50 MCG/ACT nasal spray    isosorbide mononitrate (IMDUR) 30 MG 24 hr tablet    nicotine (NICODERM CQ - DOSED IN MG/24  HOURS) 21 mg/24hr patch    hydrOXYzine (ATARAX) 25 MG tablet    doxycycline (VIBRAMYCIN) 100 MG capsule    pantoprazole (PROTONIX) 40 MG tablet Expired Prescription   rosuvastatin (CRESTOR) 40 MG tablet Reorder   LORazepam (ATIVAN) 1 MG tablet Reorder   Social History - Current smoker   Objective   Physical Exam     10/11/2023    1:43 PM 10/11/2023    1:28 PM 06/24/2023    1:52 PM  Vitals with BMI  Height  5\' 9"  5\' 9"   Weight  206 lbs 13 oz 198 lbs 13 oz  BMI  30.53 29.34  Systolic 117 91 130  Diastolic 79 63 80  Pulse  63 49   Wt Readings from Last 10 Encounters:  10/11/23 206 lb 12.8 oz (93.8 kg)  06/24/23 198 lb 12.8 oz (90.2 kg)  06/22/23 200 lb 6.4 oz (90.9 kg)  06/21/23 202 lb 6.4 oz (91.8  kg)  05/17/23 202 lb (91.6 kg)  04/19/23 206 lb 14.4 oz (93.8 kg)  04/14/23 204 lb (92.5 kg)  03/31/23 205 lb (93 kg)  03/29/23 201 lb 4.5 oz (91.3 kg)  03/29/23 201 lb 3.2 oz (91.3 kg)   Vital signs reviewed.  Nursing notes reviewed. Weight trend reviewed. Abnormalities and Problem-Specific physical exam findings:  truncal adiposity smoke odor HEENT: Plaque on tongue, leukoplakia. Asymmetry in nasolabial fold, Bell's palsy. Ears with wax accumulation. NECK: Swelling in left neck, 2 cm lymph node left mandible angle, generalized fullness. CHEST: No wheezes, no pneumonia. CARDIOVASCULAR: Heart rate elevated. Heart sounds normal. General Appearance:  No acute distress appreciable.   Well-groomed, healthy-appearing male.  Well proportioned with no abnormal fat distribution.  Good muscle tone. Pulmonary:  Normal work of breathing at rest, no respiratory distress apparent. SpO2: 95 %  Neurological:  Awake, alert, oriented, and engaged.  No obvious focal neurological deficits or cognitive impairments.  Sensorium seems unclouded.   Speech is clear and coherent with logical content. Psychiatric:  Appropriate mood, pleasant and cooperative demeanor, thoughtful and engaged during the  exam   LABS Urinalysis: No hematuria (03/2023)  RADIOLOGY CT Chest: Performed for heart evaluation (03/2023) CT Chest: Lung cancer screen (2020)   Results for orders placed or performed in visit on 10/11/23  Lipid panel  Result Value Ref Range   Cholesterol 206 (H) 0 - 200 mg/dL   Triglycerides 440.3 (H) 0.0 - 149.0 mg/dL   HDL 47.42 >59.56 mg/dL   VLDL 38.7 (H) 0.0 - 56.4 mg/dL   LDL Cholesterol 332 (H) 0 - 99 mg/dL   Total CHOL/HDL Ratio 5    NonHDL 164.07   Comprehensive metabolic panel  Result Value Ref Range   Sodium 135 135 - 145 mEq/L   Potassium 3.9 3.5 - 5.1 mEq/L   Chloride 98 96 - 112 mEq/L   CO2 30 19 - 32 mEq/L   Glucose, Bld 114 (H) 70 - 99 mg/dL   BUN 12 6 - 23 mg/dL   Creatinine, Ser 9.51 0.40 - 1.50 mg/dL   Total Bilirubin 0.4 0.2 - 1.2 mg/dL   Alkaline Phosphatase 96 39 - 117 U/L   AST 18 0 - 37 U/L   ALT 16 0 - 53 U/L   Total Protein 7.2 6.0 - 8.3 g/dL   Albumin 4.4 3.5 - 5.2 g/dL   GFR 88.41 >66.06 mL/min   Calcium 9.1 8.4 - 10.5 mg/dL  CBC with Differential/Platelet  Result Value Ref Range   WBC 17.5 (H) 4.0 - 10.5 K/uL   RBC 5.36 4.22 - 5.81 Mil/uL   Hemoglobin 16.1 13.0 - 17.0 g/dL   HCT 30.1 60.1 - 09.3 %   MCV 89.4 78.0 - 100.0 fl   MCHC 33.6 30.0 - 36.0 g/dL   RDW 23.5 57.3 - 22.0 %   Platelets 207.0 150.0 - 400.0 K/uL   Neutrophils Relative % 77.5 (H) 43.0 - 77.0 %   Lymphocytes Relative 14.1 12.0 - 46.0 %   Monocytes Relative 7.2 3.0 - 12.0 %   Eosinophils Relative 1.0 0.0 - 5.0 %   Basophils Relative 0.2 0.0 - 3.0 %   Neutro Abs 13.6 (H) 1.4 - 7.7 K/uL   Lymphs Abs 2.5 0.7 - 4.0 K/uL   Monocytes Absolute 1.3 (H) 0.1 - 1.0 K/uL   Eosinophils Absolute 0.2 0.0 - 0.7 K/uL   Basophils Absolute 0.0 0.0 - 0.1 K/uL  VITAMIN D 25 Hydroxy (Vit-D Deficiency, Fractures)  Result  Value Ref Range   VITD 38.20 30.00 - 100.00 ng/mL  Uric acid  Result Value Ref Range   Uric Acid, Serum 4.1 4.0 - 7.8 mg/dL  Phosphorus  Result Value Ref Range    Phosphorus 2.7 2.3 - 4.6 mg/dL  Magnesium  Result Value Ref Range   Magnesium 2.0 1.5 - 2.5 mg/dL   Office Visit on 16/06/9603  Component Date Value   Cholesterol 10/11/2023 206 (H)    Triglycerides 10/11/2023 239.0 (H)    HDL 10/11/2023 42.40    VLDL 10/11/2023 47.8 (H)    LDL Cholesterol 10/11/2023 116 (H)    Total CHOL/HDL Ratio 10/11/2023 5    NonHDL 10/11/2023 164.07    Sodium 10/11/2023 135    Potassium 10/11/2023 3.9    Chloride 10/11/2023 98    CO2 10/11/2023 30    Glucose, Bld 10/11/2023 114 (H)    BUN 10/11/2023 12    Creatinine, Ser 10/11/2023 1.00    Total Bilirubin 10/11/2023 0.4    Alkaline Phosphatase 10/11/2023 96    AST 10/11/2023 18    ALT 10/11/2023 16    Total Protein 10/11/2023 7.2    Albumin 10/11/2023 4.4    GFR 10/11/2023 79.36    Calcium 10/11/2023 9.1    WBC 10/11/2023 17.5 (H)    RBC 10/11/2023 5.36    Hemoglobin 10/11/2023 16.1    HCT 10/11/2023 47.9    MCV 10/11/2023 89.4    MCHC 10/11/2023 33.6    RDW 10/11/2023 14.0    Platelets 10/11/2023 207.0    Neutrophils Relative % 10/11/2023 77.5 (H)    Lymphocytes Relative 10/11/2023 14.1    Monocytes Relative 10/11/2023 7.2    Eosinophils Relative 10/11/2023 1.0    Basophils Relative 10/11/2023 0.2    Neutro Abs 10/11/2023 13.6 (H)    Lymphs Abs 10/11/2023 2.5    Monocytes Absolute 10/11/2023 1.3 (H)    Eosinophils Absolute 10/11/2023 0.2    Basophils Absolute 10/11/2023 0.0    VITD 10/11/2023 38.20    Uric Acid, Serum 10/11/2023 4.1    Phosphorus 10/11/2023 2.7    Magnesium 10/11/2023 2.0   Lab on 06/18/2023  Component Date Value   TSH 06/18/2023 0.69   Office Visit on 04/14/2023  Component Date Value   Color, Urine 04/14/2023 YELLOW    APPearance 04/14/2023 Sl Cloudy (A)    Specific Gravity, Urine 04/14/2023 1.015    pH 04/14/2023 6.0    Total Protein, Urine 04/14/2023 NEGATIVE    Urine Glucose 04/14/2023 NEGATIVE    Ketones, ur 04/14/2023 NEGATIVE    Bilirubin Urine  04/14/2023 NEGATIVE    Hgb urine dipstick 04/14/2023 MODERATE (A)    Urobilinogen, UA 04/14/2023 0.2    Leukocytes,Ua 04/14/2023 NEGATIVE    Nitrite 04/14/2023 NEGATIVE    WBC, UA 04/14/2023 0-2/hpf    RBC / HPF 04/14/2023 0-2/hpf    Amorphous 04/14/2023 Present (A)    Sodium 04/14/2023 140    Potassium 04/14/2023 4.3    Chloride 04/14/2023 103    CO2 04/14/2023 31    Glucose, Bld 04/14/2023 89    BUN 04/14/2023 12    Creatinine, Ser 04/14/2023 1.43    GFR 04/14/2023 51.84 (L)    Calcium 04/14/2023 9.0    WBC 04/14/2023 9.8    RBC 04/14/2023 5.13    Hemoglobin 04/14/2023 15.5    HCT 04/14/2023 47.4    MCV 04/14/2023 92.4    MCHC 04/14/2023 32.7    RDW 04/14/2023 16.5 (H)    Platelets 04/14/2023  235.0    Neutrophils Relative % 04/14/2023 68.3    Lymphocytes Relative 04/14/2023 18.6    Monocytes Relative 04/14/2023 9.6    Eosinophils Relative 04/14/2023 2.6    Basophils Relative 04/14/2023 0.9    Neutro Abs 04/14/2023 6.7    Lymphs Abs 04/14/2023 1.8    Monocytes Absolute 04/14/2023 0.9    Eosinophils Absolute 04/14/2023 0.3    Basophils Absolute 04/14/2023 0.1   Office Visit on 03/31/2023  Component Date Value   Color, Urine 03/31/2023 YELLOW    APPearance 03/31/2023 CLEAR    Specific Gravity, Urine 03/31/2023 1.025    pH 03/31/2023 6.5    Total Protein, Urine 03/31/2023 NEGATIVE    Urine Glucose 03/31/2023 NEGATIVE    Ketones, ur 03/31/2023 NEGATIVE    Bilirubin Urine 03/31/2023 NEGATIVE    Hgb urine dipstick 03/31/2023 NEGATIVE    Urobilinogen, UA 03/31/2023 4.0 (A)    Leukocytes,Ua 03/31/2023 NEGATIVE    Nitrite 03/31/2023 NEGATIVE    WBC, UA 03/31/2023 3-6/hpf (A)    RBC / HPF 03/31/2023 none seen    Mucus, UA 03/31/2023 Presence of (A)    Squamous Epithelial / HPF 03/31/2023 Rare(0-4/hpf)   Admission on 03/29/2023, Discharged on 03/29/2023  Component Date Value   Lipase 03/29/2023 36    Sodium 03/29/2023 137    Potassium 03/29/2023 3.7    Chloride  03/29/2023 103    CO2 03/29/2023 27    Glucose, Bld 03/29/2023 90    BUN 03/29/2023 12    Creatinine, Ser 03/29/2023 1.05    Calcium 03/29/2023 9.0    Total Protein 03/29/2023 7.1    Albumin 03/29/2023 4.3    AST 03/29/2023 18    ALT 03/29/2023 25    Alkaline Phosphatase 03/29/2023 85    Total Bilirubin 03/29/2023 0.7    GFR, Estimated 03/29/2023 >60    Anion gap 03/29/2023 7    WBC 03/29/2023 13.2 (H)    RBC 03/29/2023 5.56    Hemoglobin 03/29/2023 16.6    HCT 03/29/2023 50.5    MCV 03/29/2023 90.8    MCH 03/29/2023 29.9    MCHC 03/29/2023 32.9    RDW 03/29/2023 15.2    Platelets 03/29/2023 235    nRBC 03/29/2023 0.0    Color, Urine 03/29/2023 YELLOW    APPearance 03/29/2023 CLEAR    Specific Gravity, Urine 03/29/2023 1.032 (H)    pH 03/29/2023 6.0    Glucose, UA 03/29/2023 NEGATIVE    Hgb urine dipstick 03/29/2023 SMALL (A)    Bilirubin Urine 03/29/2023 NEGATIVE    Ketones, ur 03/29/2023 NEGATIVE    Protein, ur 03/29/2023 30 (A)    Nitrite 03/29/2023 NEGATIVE    Leukocytes,Ua 03/29/2023 NEGATIVE    RBC / HPF 03/29/2023 0-5    WBC, UA 03/29/2023 0-5    Bacteria, UA 03/29/2023 NONE SEEN    Squamous Epithelial / HPF 03/29/2023 0-5    Mucus 03/29/2023 PRESENT    Hyaline Casts, UA 03/29/2023 PRESENT   Admission on 03/22/2023, Discharged on 03/22/2023  Component Date Value   Activated Clotting Time 03/22/2023 293    Activated Clotting Time 03/22/2023 330   Lab on 03/08/2023  Component Date Value   Glucose 03/08/2023 142 (H)    BUN 03/08/2023 12    Creatinine, Ser 03/08/2023 1.23    eGFR 03/08/2023 66    BUN/Creatinine Ratio 03/08/2023 10    Sodium 03/08/2023 141    Potassium 03/08/2023 4.0    Chloride 03/08/2023 99    CO2 03/08/2023 25  Calcium 03/08/2023 8.7    WBC 03/08/2023 10.0    RBC 03/08/2023 5.82 (H)    Hemoglobin 03/08/2023 16.9    Hematocrit 03/08/2023 53.8 (H)    MCV 03/08/2023 92    MCH 03/08/2023 29.0    MCHC 03/08/2023 31.4 (L)    RDW  03/08/2023 14.4    Platelets 03/08/2023 209   Office Visit on 03/04/2023  Component Date Value   Hemoglobin A1C 03/04/2023 6.1 (A)    POC Glucose 03/04/2023 134 (A)   Lab on 03/01/2023  Component Date Value   Free T4 03/01/2023 1.12    TSH 03/01/2023 8.05 (H)    Sodium 03/01/2023 136    Potassium 03/01/2023 3.2 (L)    Chloride 03/01/2023 94 (L)    CO2 03/01/2023 31    Glucose, Bld 03/01/2023 182 (H)    BUN 03/01/2023 14    Creatinine, Ser 03/01/2023 1.30    GFR 03/01/2023 58.18 (L)    Calcium 03/01/2023 9.0   Office Visit on 02/25/2023  Component Date Value   Sodium 02/25/2023 137    Potassium 02/25/2023 4.2    Chloride 02/25/2023 100    CO2 02/25/2023 29    Glucose, Bld 02/25/2023 119 (H)    BUN 02/25/2023 16    Creatinine, Ser 02/25/2023 1.15    GFR 02/25/2023 67.40    Calcium 02/25/2023 8.9    Aldosterone 02/25/2023 3    Renin Activity 02/25/2023 0.12 (L)    ALDO / PRA Ratio 02/25/2023 25.0    Total Volume 03/01/2023 1,500    METANEPHRINE 03/01/2023 59 (L)    NORMETANEPHRINE 03/01/2023 249    METANEPHRINES, TOTAL 03/01/2023 308   There may be more visits with results that are not included.  No image results found. No results found.No results found.     Assessment & Plan Grief Grief and Anxiety Grieving and using lorazepam for anxiety. Currently taking half a tablet during the day and one at night. Discussed risks of dependency and plan to taper off. Continue lorazepam with a plan to taper off. Monitor for signs of dependency and adjust as needed.   Wife died last year. Bereavement  Other insomnia  Mixed hyperlipidemia  Cigarette smoker  Chronic kidney disease, stage 3a (HCC) Chronic Kidney Disease (CKD) Mild kidney disease with proteinuria, borderline stage 2 to stage 3. No recent blood work since July. Discussed importance of regular monitoring to prevent progression. Order comprehensive blood work and urine analysis. Continue three-month follow-up plan for  kidney function monitoring. Microscopic hematuria  Depression with anxiety  Coronary artery disease involving native coronary artery of native heart without angina pectoris  Idiopathic chronic gout of multiple sites without tophus  Iron deficiency anemia, unspecified iron deficiency anemia type  Peripheral arterial disease (HCC)  Proteinuria, unspecified type  Vitamin D deficiency  Cervical lymphadenopathy Lymphadenopathy he doesn't feel but is palpable in my medical opinion  Notable swelling in the left neck with a palpable lymph node approximately 2 cm in size. Differential includes reactive lymphadenopathy secondary to infection versus malignancy. Discussed potential need for CT scan if swelling persists post-antibiotic treatment. Reassess lymph node swelling in 10 days. If persistent, order CT scan of head and neck and refer to ENT for further evaluation. Leukoplakia of oral mucosa, including tongue Leukoplakia White plaque observed on the tongue, likely due to smoking. Discussed potential for malignant transformation. Quitting smoking may reduce risk and sometimes the plaque resolves on its own. Refer to ENT for evaluation and possible biopsy. Resistant hypertension  Benign essential microscopic hematuria  Acquired hypothyroidism  Atrial fibrillation (paroxysmal with history of RVR  Chronic maxillary sinusitis Chronic Sinusitis Presents with worsening congestion, severe cough, and sinus pressure for one week, accompanied by green and bloody nasal discharge and brown sputum. No fever or body aches. Symptoms likely exacerbated by smoking. Differential includes chronic sinusitis versus acute sinus infection. Discussed risks of untreated sinusitis and benefits of antibiotics. Prescribe Augmentin for 10 days. Follow-up in 10 days to reassess symptoms and lymphadenopathy. Further imaging may be necessary if symptoms persist. COPD GOLD 0/ emphysema on CT  Chronic Obstructive  Pulmonary Disease (COPD) Smoking history with previous emphysematous changes. No current shortness of breath or wheezing, but chronic cough present. Discussed importance of lung cancer screening and benefits of early detection. Order CT scan for lung cancer screening. Encourage smoking cessation and support with nicotine patches.  General Health Maintenance Smoker with Bell's palsy, iron deficiency anemia, and thyroid issues. Discussed importance of smoking cessation and general health maintenance. Check vitamin D levels. Encourage smoking cessation. Monitor blood pressure and continue antihypertensive medication. Ensure follow-up with thyroid management.  Follow-up Follow-up in 10 days to reassess sinusitis, lymphadenopathy, and review lab results. Ensure lab visits for blood and urine tests today. Follow-up with ENT for leukoplakia evaluation.     Orders Placed During this Encounter:   Orders Placed This Encounter  Procedures   Low Dose CT Chest w/o Contrast for Lung Cancer Screening [[id number]]    Standing Status:   Future    Expiration Date:   10/10/2024    Preferred Imaging Location?:   [id number]   Lipid panel    [place]    Has the patient fasted?:   No    Release to patient:   Immediate [1]   Comprehensive metabolic panel    Has the patient fasted?:   No    Release to patient:   Immediate [1]   CBC with Differential/Platelet    Release to patient:   Immediate [1]   Protein / creatinine ratio, urine   VITAMIN D 25 Hydroxy (Vit-D Deficiency, Fractures)    [place]   Uric acid   Protein Electrophoresis, (serum)   Phosphorus   Magnesium   TSH + free T4   Ambulatory referral to ENT    Referral Priority:   Routine    Referral Type:   Consultation    Referral Reason:   Specialty Services Required    Requested Specialty:   Otolaryngology    Number of Visits Requested:   1   Meds ordered this encounter  Medications   rosuvastatin (CRESTOR) 40 MG tablet    Sig: Take 1  tablet (40 mg total) by mouth daily. Replaces 20 mg dose    Dispense:  90 tablet    Refill:  3   LORazepam (ATIVAN) 1 MG tablet    Sig: Take 1 tablet (1 mg total) by mouth 2 (two) times daily as needed for anxiety or sleep.    Dispense:  20 tablet    Refill:  0   amoxicillin-clavulanate (AUGMENTIN) 875-125 MG tablet    Sig: Take 1 tablet by mouth 2 (two) times daily.    Dispense:  20 tablet    Refill:  0       This document was synthesized by artificial intelligence ([employer]bridge) using HIPAA-compliant recording of the clinical interaction;   We discussed the use of AI scribe software for clinical note transcription with the patient, who gave verbal consent to proceed.  Additional Info: This encounter employed state-of-the-art, real-time, collaborative documentation. The patient actively reviewed and assisted in updating their electronic medical record on a shared screen, ensuring transparency and facilitating joint problem-solving for the problem list, overview, and plan. This approach promotes accurate, informed care. The treatment plan was discussed and reviewed in detail, including medication safety, potential side effects, and all patient questions. We confirmed understanding and comfort with the plan. Follow-up instructions were established, including contacting the office for any concerns, returning if symptoms worsen, persist, or new symptoms develop, and precautions for potential emergency department visits.

## 2023-10-11 NOTE — Patient Instructions (Signed)
VISIT SUMMARY:  During today's visit, we discussed your recent symptoms of worsening cough and sinus congestion, as well as your ongoing health concerns including kidney disease, smoking, and anxiety. We reviewed your current medications and made a plan to address your symptoms and overall health.  YOUR PLAN:  -CHRONIC SINUSITIS: Chronic sinusitis is a long-term inflammation of the sinuses that can cause congestion, cough, and sinus pressure. We have prescribed Augmentin for 10 days to treat the infection. Please follow up in 10 days to reassess your symptoms and the swelling in your lymph node.  -LYMPHADENOPATHY: Lymphadenopathy refers to swollen lymph nodes, which can be a sign of infection or other conditions. We will reassess the swelling in your neck in 10 days. If it persists, we may need to order a CT scan and refer you to an ENT specialist.  -LEUKOPLAKIA: Leukoplakia is a white plaque on the tongue that can be caused by smoking and has the potential to become cancerous. We recommend quitting smoking and will refer you to an ENT specialist for further evaluation and possible biopsy.  -CHRONIC OBSTRUCTIVE PULMONARY DISEASE (COPD): COPD is a lung condition often caused by smoking that makes it hard to breathe. We will order a CT scan for lung cancer screening and encourage you to quit smoking, possibly using nicotine patches.  -CHRONIC KIDNEY DISEASE (CKD): Chronic kidney disease is a condition where the kidneys gradually lose function. We will order comprehensive blood work and a urine analysis to monitor your kidney function and continue with regular follow-ups every three months.  -GRIEF AND ANXIETY: Grief and anxiety can significantly impact your well-being. You are currently taking lorazepam to manage these feelings. We discussed the risks of dependency and plan to taper off the medication gradually while monitoring for any signs of dependency.  -GENERAL HEALTH MAINTENANCE: We discussed  the importance of smoking cessation, monitoring your blood pressure, and managing your thyroid condition. We will check your vitamin D levels and ensure you continue with your current medications.  INSTRUCTIONS:  Please follow up in 10 days to reassess your sinusitis, lymphadenopathy, and review lab results. Ensure you complete the lab visits for blood and urine tests today. Additionally, follow up with an ENT specialist for the evaluation of leukoplakia.

## 2023-10-11 NOTE — Assessment & Plan Note (Signed)
Chronic Obstructive Pulmonary Disease (COPD) Smoking history with previous emphysematous changes. No current shortness of breath or wheezing, but chronic cough present. Discussed importance of lung cancer screening and benefits of early detection. Order CT scan for lung cancer screening. Encourage smoking cessation and support with nicotine patches.

## 2023-10-11 NOTE — Assessment & Plan Note (Signed)
Chronic Kidney Disease (CKD) Mild kidney disease with proteinuria, borderline stage 2 to stage 3. No recent blood work since July. Discussed importance of regular monitoring to prevent progression. Order comprehensive blood work and urine analysis. Continue three-month follow-up plan for kidney function monitoring.

## 2023-10-14 ENCOUNTER — Encounter: Payer: Self-pay | Admitting: Internal Medicine

## 2023-10-14 LAB — PROTEIN / CREATININE RATIO, URINE
Creatinine, Urine: 16 mg/dL — ABNORMAL LOW (ref 20–320)
Total Protein, Urine: <4 mg/dL — ABNORMAL LOW (ref 5–25)

## 2023-10-14 LAB — PROTEIN ELECTROPHORESIS, SERUM
Albumin ELP: 4.3 g/dL (ref 3.8–4.8)
Alpha 1: 0.3 g/dL (ref 0.2–0.3)
Alpha 2: 0.6 g/dL (ref 0.5–0.9)
Beta 2: 0.4 g/dL (ref 0.2–0.5)
Beta Globulin: 0.5 g/dL (ref 0.4–0.6)
Gamma Globulin: 0.9 g/dL (ref 0.8–1.7)
Total Protein: 6.9 g/dL (ref 6.1–8.1)

## 2023-10-14 LAB — T4, FREE: Free T4: 2.3 ng/dL — ABNORMAL HIGH (ref 0.8–1.8)

## 2023-10-14 LAB — TSH+FREE T4: TSH W/REFLEX TO FT4: 0.08 m[IU]/L — ABNORMAL LOW (ref 0.40–4.50)

## 2023-10-14 NOTE — Progress Notes (Signed)
Reviewed comprehensive labs from 10/11/2023. Kidney function shows improvement with GFR now 79. Thyroid labs show elevation requiring adjustment; will await endocrinology recommendations at upcoming appointment (10/22/23). Glucose and cholesterol elevated. Detailed explanation and plan provided in Patient Message. Follow-up appointment scheduled for 10/25/23.

## 2023-10-14 NOTE — Progress Notes (Signed)
Informed patient of lab results/notes. Mailing lab results/notes also per patient request.

## 2023-10-14 NOTE — Telephone Encounter (Signed)
Informed patient of lab results/notes. Mailing lab results to patient per his request.

## 2023-10-15 ENCOUNTER — Other Ambulatory Visit: Payer: 59

## 2023-10-15 ENCOUNTER — Other Ambulatory Visit: Payer: Self-pay

## 2023-10-19 ENCOUNTER — Ambulatory Visit
Admission: RE | Admit: 2023-10-19 | Discharge: 2023-10-19 | Disposition: A | Discharge status: Home / Self Care | Payer: 59 | Source: Ambulatory Visit | Attending: Internal Medicine | Admitting: Internal Medicine

## 2023-10-19 DIAGNOSIS — N1831 Chronic kidney disease, stage 3a: Secondary | ICD-10-CM

## 2023-10-19 DIAGNOSIS — F17210 Nicotine dependence, cigarettes, uncomplicated: Secondary | ICD-10-CM

## 2023-10-21 NOTE — Telephone Encounter (Signed)
 Copied from CRM [id number]. Topic: Clinical - Lab/Test Results >> Oct 21, 2023 12:04 PM Chantha C wrote: Reason for CRM: Patient returning call on imaging results. Please call back at [phone]    Called the phone number to escalate imaging results for patient. Called patient back and his phone went straight to vm. Left a vm informing patient that his imaging has not been resulted yet and of this information.

## 2023-10-22 ENCOUNTER — Ambulatory Visit: Payer: 59 | Admitting: "Endocrinology

## 2023-10-22 ENCOUNTER — Encounter: Payer: Self-pay | Admitting: "Endocrinology

## 2023-10-22 VITALS — BP 116/80 | HR 70 | Resp 20 | Ht 69.0 in | Wt 206.8 lb

## 2023-10-22 DIAGNOSIS — F17200 Nicotine dependence, unspecified, uncomplicated: Secondary | ICD-10-CM

## 2023-10-22 DIAGNOSIS — E890 Postprocedural hypothyroidism: Secondary | ICD-10-CM | POA: Diagnosis not present

## 2023-10-22 MED ORDER — LEVOTHYROXINE SODIUM 200 MCG PO TABS: 200.0000 ug | ORAL_TABLET | Freq: Every day | ORAL | 3 refills | Status: DC

## 2023-10-22 NOTE — Telephone Encounter (Signed)
 Copied from CRM [id number]. Topic: Clinical - Lab/Test Results >> Oct 21, 2023  5:01 PM Evie B wrote: Reason for CRM: Pt called to follow up on chest xray results      please call pt back at  [phone]    Called patient back and informed him that the imaging results are available but Dr. Jesus has not yet reviewed them. Also, that I will call him back when he has reviewed them.

## 2023-10-22 NOTE — Progress Notes (Signed)
 Outpatient Endocrinology Note Obadiah Birmingham, MD  10/22/23   Jesse ZURAWSKI [name] May 10, 1961 [id number]  Referring Provider: Jesus Bernardino MATSU, MD Primary Care Provider: Jesus Bernardino MATSU, MD Subjective  No chief complaint on file.   Assessment & Plan  Diagnoses and all orders for this visit:  Hypothyroidism following radioiodine therapy -     TSH(Reflex) -     levothyroxine  (SYNTHROID ) 200 MCG tablet; Take 1 tablet (200 mcg total) by mouth daily. 200 mg daily except skip on Sundays  Smoker   Jesse LAWS [name] is currently taking synthroid  200 mcg Mon-Sat and half a pill (100mcg) on Sunday. Patient is currently biochemically euthyroid.  Educated on thyroid  axis.  10/22/23: Recommend the following: Take synthroid  200 mcg Mon-Sat and skip on Sunday. Advised to take levothyroxine  first thing in the morning on empty stomach and wait at least 30 minutes to 1 hour before eating or drinking anything or taking any other medications. Space out levothyroxine  by 4 hours from any acid reflux medication/fibrate/iron/calcium /multivitamin. Advised to take birth control pills and nutritional supplements in the evening. Repeat lab before next visit or sooner if symptoms of hyperthyroidism or hypothyroidism develop.  Notify us  immediately in case of pregnancy/breastfeeding or significant weight gain or loss. Counseled on compliance and follow up needs.  The patient was counseled on the dangers of tobacco use, and was advised to quit.  Reviewed strategies to maximize success, including removing cigarettes and smoking materials from environment and written materials. Has nicotine  patches from PCP to curb nicotine  dependence    I have reviewed current medications, nurse's notes, allergies, vital signs, past medical and surgical history, family medical history, and social history for this encounter. Counseled patient on symptoms, examination findings, lab findings, imaging results, treatment  decisions and monitoring and prognosis. The patient understood the recommendations and agrees with the treatment plan. All questions regarding treatment plan were fully answered.   Return in about 3 months (around 01/19/2024) for visit + labs before next visit.   Obadiah Birmingham, MD  10/22/23   I have reviewed current medications, nurse's notes, allergies, vital signs, past medical and surgical history, family medical history, and social history for this encounter. Counseled patient on symptoms, examination findings, lab findings, imaging results, treatment decisions and monitoring and prognosis. The patient understood the recommendations and agrees with the treatment plan. All questions regarding treatment plan were fully answered.   History of Present Illness Jesse NELIS [name] is a 63 y.o. year old male who presents to our clinic with post-ablative hypothyroidism.    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   Symptoms suggestive of HYPOTHYROIDISM:  fatigue No weight gain No cold intolerance  Yes constipation  No  Symptoms suggestive of HYPERTHYROIDISM:  weight loss  No heat intolerance No hyperdefecation  No palpitations  No  Compressive symptoms:  dysphagia  No dysphonia  No positional dyspnea (especially with simultaneous arms elevation)  No  Smokes  Yes On biotin  No Personal history of head/neck surgery/irradiation  Yes   Physical Exam  BP 116/80 (BP Location: Left Arm, Patient Position: Sitting, Cuff Size: Large)   Pulse 70   Resp 20   Ht 5' 9 (1.753 m)   Wt 206 lb 12.8 oz (93.8 kg)   SpO2 95%   BMI 30.54 kg/m  Constitutional: well developed, well nourished Head: normocephalic, atraumatic, no exophthalmos Eyes: sclera anicteric, no redness Neck: no thyromegaly, no thyroid  tenderness; no nodules palpated Lungs: normal respiratory  effort Neurology: alert and oriented, no fine hand tremor Skin: dry, no appreciable  rashes Musculoskeletal: no appreciable defects Psychiatric: normal mood and affect  Allergies Allergies  Allergen Reactions   Sulfa Drugs Cross Reactors Hives   Codeine Other (See Comments)    Made him very jittery    Hydrochlorothiazide  Other (See Comments)    Hyponatremia    Current Medications Patient's Medications  New Prescriptions   No medications on file  Previous Medications   AMOXICILLIN -CLAVULANATE (AUGMENTIN ) 875-125 MG TABLET    Take 1 tablet by mouth 2 (two) times daily.   CHOLECALCIFEROL (VITAMIN D -3) 125 MCG (5000 UT) TABS    Take 5,000 Units by mouth daily.   CLONIDINE  (CATAPRES  - DOSED IN MG/24 HR) 0.1 MG/24HR PATCH    PLACE 1 PATCH (0.1 MG TOTAL) ONTO THE SKIN ONCE A WEEK.   CLONIDINE  (CATAPRES  - DOSED IN MG/24 HR) 0.2 MG/24HR PATCH    0.2 mg once a week.   EDEX 20 MCG INJECTION    SMARTSIG:1 Milliliter(s) Intracavernosally Every Other Day PRN   EZETIMIBE  (ZETIA ) 10 MG TABLET    Take 1 tablet (10 mg total) by mouth daily.   LEVALBUTEROL  (XOPENEX  HFA) 45 MCG/ACT INHALER    Inhale 1 puff into the lungs 4 (four) times daily.   LORATADINE  (CLARITIN ) 10 MG TABLET    Take 1 tablet (10 mg total) by mouth daily.   LORAZEPAM  (ATIVAN ) 1 MG TABLET    Take 1 tablet (1 mg total) by mouth 2 (two) times daily as needed for anxiety or sleep.   LOSARTAN  (COZAAR ) 100 MG TABLET    Take 1 tablet (100 mg total) by mouth daily.   METOPROLOL  TARTRATE (LOPRESSOR ) 50 MG TABLET    Take 50 mg by mouth 2 (two) times daily.   MONTELUKAST  (SINGULAIR ) 10 MG TABLET    Take 1 tablet (10 mg total) by mouth at bedtime. TAKE 1 TABLET BY MOUTH EVERYDAY AT BEDTIME Strength: 10 mg   MULTIPLE VITAMINS-MINERALS (MULTIVITAMIN WITH MINERALS) TABLET    Take 1 tablet by mouth daily.   RIVAROXABAN  (XARELTO ) 20 MG TABS TABLET    Take 1 tablet (20 mg total) by mouth daily with supper.   ROSUVASTATIN  (CRESTOR ) 40 MG TABLET    Take 1 tablet (40 mg total) by mouth daily. Replaces 20 mg dose   SALINE (SIMPLY SALINE)  0.9 % AERS    Place 2 each into the nose as directed. Use nightly for sinus hygiene long-term.  Can also be used as many times daily as desired to assist with clearing congested sinuses.   SPIRONOLACTONE  (ALDACTONE ) 25 MG TABLET    Take 1 tablet (25 mg total) by mouth daily.   TAMSULOSIN  (FLOMAX ) 0.4 MG CAPS CAPSULE    TAKE 2 CAPSULES BY MOUTH EVERY DAY   TORSEMIDE  (DEMADEX ) 10 MG TABLET    Take 10 mg by mouth daily.   VARDENAFIL  (LEVITRA ) 20 MG TABLET    TAKE 1 TABLET BY MOUTH EVERY DAY AS NEEDED FOR ERECTILE DYSFUNCTION  Modified Medications   Modified Medication Previous Medication   LEVOTHYROXINE  (SYNTHROID ) 200 MCG TABLET levothyroxine  (SYNTHROID ) 200 MCG tablet      Take 1 tablet (200 mcg total) by mouth daily. 200 mg daily except skip on Sundays    Take 1 tablet (200 mcg total) by mouth daily. Take as recommended by endocrine.  200 mg daily except for Sunday, take 100 mg.  Discontinued Medications   No medications on file    Past  Medical History Past Medical History:  Diagnosis Date   Abdominal pain 03/31/2023   Epigastric and diffuse, emergency room visit did excellent evaluation. Seems to be improving with protonix  and hold colchicine .  Leucocytosis noted     Acute hypoxemic respiratory failure (HCC) 09/18/2022   Acute kidney injury (HCC) 05/09/2022   Microscopic hematuria (R31.9): Updated 04/14/23. Moderate blood in urine. Renal ultrasound and specialist referrals ordered.  AKI vs Chronic kidney disease, stage 3a (N18.31): New diagnosis 04/14/23. GFR 51.84, creatinine 1.43. Nephrology referral placed.  Benign prostatic hyperplasia (N40.1): Needs follow-up. Last seen by urology several years ago. Urology referral placed for re-evaluation.  Prot   Allergy    Anxiety    Atrial fibrillation (HCC)    Atrial fibrillation with RVR (HCC) 09/12/2022   Bell palsy    Depression    Diastolic heart failure    HAP (hospital-acquired pneumonia) 09/17/2022   Hypercholesterolemia     Hypertension    Hypokalemia 03/07/2023      moderate (2.8 - 3.5 meq/dl)  Recent started spironolactone           Lab Results      Component    Value    Date/Time           K    3.7    03/29/2023 05:26 PM           K    4.0    03/08/2023 11:04 AM           K    3.2 (L)    03/01/2023 10:09 AM           K    4.2    02/25/2023 03:37 PM           K    3.9    11/30/2022 10:29 AM           K    3.9    10/19/2022 10:03 AM           K    4.3    01   Pneumonia    Pneumonia of right lower lobe due to infectious organism 09/17/2022   Thyroid  disease     Past Surgical History Past Surgical History:  Procedure Laterality Date   ATRIAL FIBRILLATION ABLATION N/A 03/22/2023   Procedure: ATRIAL FIBRILLATION ABLATION;  Surgeon: Nancey Eulas BRAVO, MD;  Location: [facility];  Service: Cardiovascular;  Laterality: N/A;   CARDIOVERSION N/A 09/15/2022   Procedure: CARDIOVERSION;  Surgeon: Santo Stanly LABOR, MD;  Location: [facility];  Service: Cardiovascular;  Laterality: N/A;   CARDIOVERSION N/A 10/22/2022   Procedure: CARDIOVERSION;  Surgeon: Mona Vinie BROCKS, MD;  Location: [facility];  Service: Cardiovascular;  Laterality: N/A;   CHOLECYSTECTOMY     HERNIA REPAIR     KNEE SURGERY     VASECTOMY      Family History family history includes Alcohol abuse in his father; Cirrhosis in his father; Heart attack in his mother; Heart disease in his mother; Hypertension in his father; Kidney cancer in his mother; Mental illness in his brother; Other in his father and mother; Parkinsonism in his brother.  Social History Social History   Socioeconomic History   Marital status: Married    Spouse name: Not on file   Number of children: 1   Years of education: Not on file   Highest education level: Not on file  Occupational History   Not on file  Tobacco Use   Smoking status: Every Day  Current packs/day: 0.50    Average packs/day: 0.5 packs/day for 45.1 years (22.6 ttl pk-yrs)    Types: Cigarettes     Start date: 09/14/1978   Smokeless tobacco: Never  Vaping Use   Vaping status: Never Used  Substance and Sexual Activity   Alcohol use: Yes    Alcohol/week: 0.0 standard drinks of alcohol    Comment: rarely   Drug use: No   Sexual activity: Never    Birth control/protection: None  Other Topics Concern   Not on file  Social History Narrative   Marital status: married x 30+ years      Children:  2 children;(38,37); 3+3 grandchild      Lives:  With wife, son      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      Tobacco: 1 ppd x 30 years      Alcohol: rare     Regular exercise: walking daily   Caffeine use: daily      Social Drivers of Corporate Investment Banker Strain: Not on file  Food Insecurity: No Food Insecurity (09/13/2022)   Hunger Vital Sign    Worried About Running Out of Food in the Last Year: Never true    Ran Out of Food in the Last Year: Never true  Transportation Needs: No Transportation Needs (09/13/2022)   PRAPARE - Administrator, Civil Service (Medical): No    Lack of Transportation (Non-Medical): No  Physical Activity: Not on file  Stress: Not on file  Social Connections: Unknown (01/27/2022)   Received from [facility]   Social Network    Social Network: Not on file  Intimate Partner Violence: Not At Risk (09/13/2022)   Humiliation, Afraid, Rape, and Kick questionnaire    Fear of Current or Ex-Partner: No    Emotionally Abused: No    Physically Abused: No    Sexually Abused: No    Laboratory Investigations Lab Results  Component Value Date   TSH 0.69 06/18/2023   TSH 8.05 (H) 03/01/2023   TSH 0.22 (L) 11/30/2022   FREET4 2.3 (H) 10/11/2023   FREET4 1.12 03/01/2023   FREET4 1.52 11/30/2022     No results found for: TSI   No components found for: TRAB   Lab Results  Component Value Date   CHOL 206 (H) 10/11/2023   Lab Results  Component Value Date   HDL 42.40 10/11/2023   Lab Results   Component Value Date   LDLCALC 116 (H) 10/11/2023   Lab Results  Component Value Date   TRIG 239.0 (H) 10/11/2023   Lab Results  Component Value Date   CHOLHDL 5 10/11/2023   Lab Results  Component Value Date   CREATININE 1.00 10/11/2023   Lab Results  Component Value Date   GFR 79.36 10/11/2023      Component Value Date/Time   NA 135 10/11/2023 1417   NA 141 03/08/2023 1104   K 3.9 10/11/2023 1417   CL 98 10/11/2023 1417   CO2 30 10/11/2023 1417   GLUCOSE 114 (H) 10/11/2023 1417   BUN 12 10/11/2023 1417   BUN 12 03/08/2023 1104   CREATININE 1.00 10/11/2023 1417   CREATININE 0.98 01/06/2017 1617   CALCIUM  9.1 10/11/2023 1417   PROT 7.2 10/11/2023 1417   PROT 6.9 10/11/2023 1417   PROT 6.6 10/19/2022 1003   ALBUMIN 4.4 10/11/2023 1417   ALBUMIN 4.3 10/19/2022 1003   AST 18 10/11/2023 1417  ALT 16 10/11/2023 1417   ALKPHOS 96 10/11/2023 1417   BILITOT 0.4 10/11/2023 1417   BILITOT 0.5 10/19/2022 1003   GFRNONAA >60 03/29/2023 1726   GFRNONAA >89 05/22/2016 1308   GFRAA >60 09/30/2019 1937   GFRAA >89 05/22/2016 1308      Latest Ref Rng & Units 10/11/2023    2:17 PM 04/14/2023    2:56 PM 03/29/2023    5:26 PM  BMP  Glucose 70 - 99 mg/dL 885  89  90   BUN 6 - 23 mg/dL 12  12  12    Creatinine 0.40 - 1.50 mg/dL 8.99  8.56  8.94   Sodium 135 - 145 mEq/L 135  140  137   Potassium 3.5 - 5.1 mEq/L 3.9  4.3  3.7   Chloride 96 - 112 mEq/L 98  103  103   CO2 19 - 32 mEq/L 30  31  27    Calcium  8.4 - 10.5 mg/dL 9.1  9.0  9.0        Component Value Date/Time   WBC 17.5 (H) 10/11/2023 1417   RBC 5.36 10/11/2023 1417   HGB 16.1 10/11/2023 1417   HGB 16.9 03/08/2023 1104   HCT 47.9 10/11/2023 1417   HCT 53.8 (H) 03/08/2023 1104   PLT 207.0 10/11/2023 1417   PLT 209 03/08/2023 1104   MCV 89.4 10/11/2023 1417   MCV 92 03/08/2023 1104   MCH 29.9 03/29/2023 1726   MCHC 33.6 10/11/2023 1417   RDW 14.0 10/11/2023 1417   RDW 14.4 03/08/2023 1104   LYMPHSABS 2.5  10/11/2023 1417   MONOABS 1.3 (H) 10/11/2023 1417   EOSABS 0.2 10/11/2023 1417   BASOSABS 0.0 10/11/2023 1417      Parts of this note may have been dictated using voice recognition software. There may be variances in spelling and vocabulary which are unintentional. Not all errors are proofread. Please notify the dino if any discrepancies are noted or if the meaning of any statement is not clear.

## 2023-10-25 ENCOUNTER — Encounter: Payer: Self-pay | Admitting: Internal Medicine

## 2023-10-25 ENCOUNTER — Ambulatory Visit: Payer: 59 | Admitting: Internal Medicine

## 2023-10-25 VITALS — BP 106/70 | HR 61 | Temp 97.8°F | Ht 69.0 in | Wt 201.4 lb

## 2023-10-25 DIAGNOSIS — I2510 Atherosclerotic heart disease of native coronary artery without angina pectoris: Secondary | ICD-10-CM

## 2023-10-25 DIAGNOSIS — R911 Solitary pulmonary nodule: Secondary | ICD-10-CM

## 2023-10-25 DIAGNOSIS — I10 Essential (primary) hypertension: Secondary | ICD-10-CM | POA: Diagnosis not present

## 2023-10-25 DIAGNOSIS — B370 Candidal stomatitis: Secondary | ICD-10-CM | POA: Diagnosis not present

## 2023-10-25 DIAGNOSIS — F17210 Nicotine dependence, cigarettes, uncomplicated: Secondary | ICD-10-CM | POA: Diagnosis not present

## 2023-10-25 MED ORDER — NYSTATIN 100000 UNIT/ML MT SUSP: 5.0000 mL | Freq: Four times a day (QID) | OROMUCOSAL | 0 refills | Status: DC

## 2023-10-25 NOTE — Patient Instructions (Addendum)
Take half tablet only losartan 100 instead of full tablet and monitor blood pressure at home and return to clinic in 2-4 week(s) for blood pressure review.  VISIT SUMMARY:  Today, we discussed the follow-up of your lung nodule, blood pressure management, and other health concerns. We reviewed your recent CT scan results, blood pressure readings, and addressed your concerns about a white patch on your tongue and your thyroid levels. We also talked about the importance of smoking cessation and lifestyle modifications to manage your existing conditions.  YOUR PLAN:  -LUNG NODULE: A 3 mm nodule in your left upper lung was found on a recent CT scan. It is considered benign with a low risk of becoming cancerous. We will monitor it with another CT scan in one year. Quitting smoking is strongly advised to reduce the risk of growth.  -EMPHYSEMA: Emphysema is a lung condition that causes shortness of breath. It was confirmed on your recent CT scan and is currently well-managed. Quitting smoking is crucial to prevent further lung damage.  -CORONARY ARTERY DISEASE WITH ATHEROSCLEROSIS: This condition involves the hardening and narrowing of the arteries due to plaque buildup. Your CT scan showed coronary artery calcifications and mild atherosclerosis. Continue taking your cholesterol medication and make lifestyle changes, including quitting smoking, to manage this condition.  -HYPOTENSION: Hypotension means low blood pressure. Your recent readings were low but improved upon re-evaluation. We will reduce your clonidine patch to 0.1 mg and your losartan to half a tablet. Please monitor your blood pressure at home and return to the clinic in 2-4 weeks for a review.  -ORAL THRUSH: Oral thrush is a fungal infection in the mouth, which appears as a white patch on the tongue. We recommend using an antifungal mouthwash and increasing your water intake to treat this condition.  -GENERAL HEALTH MAINTENANCE: Maintain  regular health screenings and make lifestyle changes to manage your existing conditions. Quitting smoking and monitoring your blood pressure at home are particularly important.  INSTRUCTIONS:  Please return to the clinic in 2-4 weeks for a blood pressure review. We will also schedule a follow-up CT scan in one year to monitor the lung nodule.  It was a pleasure seeing you today! Your health and satisfaction are our top priorities.  Glenetta Hew, MD  Your Providers PCP: Lula Olszewski, MD,  [phone]) Referring Provider: Lula Olszewski, MD,  [phone]) Care Team Provider: Reather Littler, MD,  [phone]) Care Team Provider: Marinus Maw, MD,  [phone]) Care Team Provider: Jeani Hawking, MD,  [phone]) Care Team Provider: Maurice Small, MD,  [phone]) Care Team Provider: Maurice Small, MD,  [phone])     NEXT STEPS: [x]  Early Intervention: Schedule sooner appointment, call our on-call services, or go to emergency room if there is any significant Increase in pain or discomfort New or worsening symptoms Sudden or severe changes in your health [x]  Flexible Follow-Up: We recommend a No follow-ups on file. for optimal routine care. This allows for progress monitoring and treatment adjustments. [x]  Preventive Care: Schedule your annual preventive care visit! It's typically covered by insurance and helps identify potential health issues early. [x]  Lab & X-ray Appointments: Incomplete tests scheduled today, or call to schedule. X-rays:  [facility] (M-F, 8:30am-noon or 1pm-5pm). [x]  Medical Information Release: Sign a release form at front desk to obtain relevant medical information we don't have.  MAKING THE MOST OF OUR FOCUSED 20 MINUTE APPOINTMENTS: [x]   Clearly state your top concerns at the beginning of  the visit to focus our discussion [x]   If you anticipate you will need more time, please inform the front desk during  scheduling - we can book multiple appointments in the same week. [x]   If you have transportation problems- use our convenient video appointments or ask about transportation support. [x]   We can get down to business faster if you use MyChart to update information before the visit and submit non-urgent questions before your visit. Thank you for taking the time to provide details through MyChart.  Let our nurse know and she can import this information into your encounter documents.  Arrival and Wait Times: [x]   Arriving on time ensures that everyone receives prompt attention. [x]   Early morning (8a) and afternoon (1p) appointments tend to have shortest wait times. [x]   Unfortunately, we cannot delay appointments for late arrivals or hold slots during phone calls.  Getting Answers and Following Up [x]   Simple Questions & Concerns: For quick questions or basic follow-up after your visit, reach [place] at [phone] or MyChart messaging. [x]   Complex Concerns: If your concern is more complex, scheduling an appointment might be best. Discuss this with the staff to find the most suitable option. [x]   Lab & Imaging Results: We'll contact you directly if results are abnormal or you don't use MyChart. Most normal results will be on MyChart within 2-3 business days, with a review message from Dr. Jon Billings. Haven't heard back in 2 weeks? Need results sooner? Contact us at [phone]. [x]   Referrals: Our referral coordinator will manage specialist referrals. The specialist's office should contact you within 2 weeks to schedule an appointment. Call us if you haven't heard from them after 2 weeks.  Staying Connected [x]   MyChart: Activate your MyChart for the fastest way to access results and message [place]. See the last page of this paperwork for instructions on how to activate.  Bring to Your Next Appointment [x]   Medications: Please bring all your medication bottles to your next appointment to ensure we have an  accurate record of your prescriptions. [x]   Health Diaries: If you're monitoring any health conditions at home, keeping a diary of your readings can be very helpful for discussions at your next appointment.  Billing [x]   X-ray & Lab Orders: These are billed by separate companies. Contact the invoicing company directly for questions or concerns. [x]   Visit Charges: Discuss any billing inquiries with our administrative services team.  Your Satisfaction Matters [x]   Share Your Experience: We strive for your satisfaction! If you have any complaints, or preferably compliments, please let Dr. Jon Billings know directly or contact our Practice Administrators, Edwena Felty or [employer], by asking at the front desk.   Reviewing Your Records [x]   Review this early draft of your clinical encounter notes below and the final encounter summary tomorrow on MyChart after its been completed.  All orders placed so far are visible here: Cigarette smoker -     CT CHEST LUNG CANCER SCREENING LOW DOSE WO CONTRAST; Future  Thrush -     magic mouthwash (nystatin, lidocaine, diphenhydrAMINE, alum & mag hydroxide) suspension; Swish and swallow 5 mLs 4 (four) times daily.  Dispense: 180 mL; Refill: 0  Lung nodule, solitary  Coronary artery disease involving native coronary artery of native heart without angina pectoris  Primary hypertension

## 2023-10-26 NOTE — Assessment & Plan Note (Signed)
Emphysema Emphysema was confirmed on a recent CT scan and is well-managed. Smoking cessation was advised to prevent further progression. Continue current management and encourage smoking cessation.

## 2023-10-26 NOTE — Assessment & Plan Note (Signed)
Coronary Artery Disease with Atherosclerosis CT scan revealed coronary artery calcifications and mild atherosclerosis. Cholesterol medication is ongoing. Discussed the importance of lifestyle modifications, including smoking cessation. Continue current cholesterol medication and encourage lifestyle modifications, including smoking cessation.

## 2023-10-26 NOTE — Progress Notes (Signed)
==============================  [facility]: [id number]   -- Medical Office Visit --  Patient: Jesse SCHICKER [name]      Age: 63 y.o.       Sex:  male  Date:   10/25/2023 Today's Healthcare Provider: Lula Olszewski, MD  ==============================   CHIEF COMPLAINT: 2 week follow-up  SUBJECTIVE: Background This is a 63 y.o. male who has Atrial fibrillation (paroxysmal with history of RVR; Resistant hypertension; Depression with anxiety; ED (erectile dysfunction); Hx of Bell's palsy; Amputated toe (HCC); Cigarette smoker; Bone neoplasm; PVC's (premature ventricular contractions); COPD GOLD 0/ emphysema on CT ; Vitamin D deficiency; Iron deficiency anemia; Tobacco use disorder; Mood disorder (HCC); Prolonged QT interval; Lumbar degenerative disc disease; Bladder dysfunction; Seasonal allergies; Major depressive disorder, single episode, in remission (HCC); Gout; Hypothyroidism; Hyperlipidemia; CAD (coronary artery disease); Prediabetes; Peripheral arterial disease (HCC); Diverticular disease; Thoracic spondylosis; Microscopic hematuria; Aortic atherosclerosis (HCC); Obesity due to energy imbalance; Epigastric pain; Chronic kidney disease, stage 3a (HCC); Hematuria; Proteinuria; Hypercoagulable state due to persistent atrial fibrillation (HCC); and BPH (benign prostatic hyperplasia) on their problem list.  History of Present Illness Jesse Haring Bennison [name] "Jesse Suarez" is a 63 year old male with emphysema and coronary artery disease who presents for follow-up of a lung nodule and blood pressure management.  He is being monitored for a 3 mm nodule in the left upper lobe of the lung, identified on a recent CT scan. The nodule is not currently suspicious for malignancy. He has a history of emphysema and continues to smoke, which poses a risk for further lung damage.  He has coronary artery disease with known coronary artery calcifications and mild atherosclerosis.  He is on cholesterol medications. He smokes one pack a day, which is a risk factor for his condition.  He is experiencing low blood pressure, with recent readings of 80/50 mmHg, which improved to 106/70 mmHg after re-evaluation. No dizziness or lightheadedness. He is on multiple antihypertensive medications, including a clonidine patch and losartan.  He has noticed a white patch on his tongue, which he has been treating with salt water. No pain when swallowing and no other symptoms related to this issue.  He mentions a recent sinus infection, which has improved.  He expresses concern about his thyroid levels after being advised by his thyroid doctor to skip a dose on Sunday. He is currently maintaining his regular thyroid medication dose.  Reviewed chart records that patient  has a past medical history of Abdominal pain (03/31/2023), Acute hypoxemic respiratory failure (HCC) (09/18/2022), Acute kidney injury (HCC) (05/09/2022), Allergy, Anxiety, Atrial fibrillation (HCC), Atrial fibrillation with RVR (HCC) (09/12/2022), Bell palsy, Depression, Diastolic heart failure, HAP (hospital-acquired pneumonia) (09/17/2022), Hypercholesterolemia, Hypertension, Hypokalemia (03/07/2023), Pneumonia, Pneumonia of right lower lobe due to infectious organism (09/17/2022), and Thyroid disease. Discussed Past Medical History - Coronary artery disease - Mild atherosclerosis - Emphysema - Lung nodule (3 mm) - Hypertension  Social History - Patient is a smoker   Problem list overviews that were updated at today's visit:No problems updated.  Today's Verbally Confirmed Medications - Cholesterol medication - Losartan (considering reducing dose) - Clonidine patch 0.2 mg Current Outpatient Medications on File Prior to Visit  Medication Sig   amoxicillin-clavulanate (AUGMENTIN) 875-125 MG tablet Take 1 tablet by mouth 2 (two) times daily.   Cholecalciferol (VITAMIN D-3) 125 MCG (5000 UT) TABS Take 5,000 Units by  mouth daily.   cloNIDine (CATAPRES - DOSED IN MG/24 HR) 0.1 mg/24hr patch PLACE  1 PATCH (0.1 MG TOTAL) ONTO THE SKIN ONCE A WEEK.   cloNIDine (CATAPRES - DOSED IN MG/24 HR) 0.2 mg/24hr patch 0.2 mg once a week.   EDEX 20 MCG injection SMARTSIG:1 Milliliter(s) Intracavernosally Every Other Day PRN   ezetimibe (ZETIA) 10 MG tablet Take 1 tablet (10 mg total) by mouth daily.   levalbuterol (XOPENEX HFA) 45 MCG/ACT inhaler Inhale 1 puff into the lungs 4 (four) times daily.   levothyroxine (SYNTHROID) 200 MCG tablet Take 1 tablet (200 mcg total) by mouth daily. 200 mg daily except skip on Sundays   loratadine (CLARITIN) 10 MG tablet Take 1 tablet (10 mg total) by mouth daily. (Patient taking differently: Take 10 mg by mouth as needed.)   LORazepam (ATIVAN) 1 MG tablet Take 1 tablet (1 mg total) by mouth 2 (two) times daily as needed for anxiety or sleep.   losartan (COZAAR) 100 MG tablet Take 1 tablet (100 mg total) by mouth daily.   metoprolol tartrate (LOPRESSOR) 50 MG tablet Take 50 mg by mouth 2 (two) times daily.   montelukast (SINGULAIR) 10 MG tablet Take 1 tablet (10 mg total) by mouth at bedtime. TAKE 1 TABLET BY MOUTH EVERYDAY AT BEDTIME Strength: 10 mg   Multiple Vitamins-Minerals (MULTIVITAMIN WITH MINERALS) tablet Take 1 tablet by mouth daily.   rivaroxaban (XARELTO) 20 MG TABS tablet Take 1 tablet (20 mg total) by mouth daily with supper.   rosuvastatin (CRESTOR) 40 MG tablet Take 1 tablet (40 mg total) by mouth daily. Replaces 20 mg dose   Saline (SIMPLY SALINE) 0.9 % AERS Place 2 each into the nose as directed. Use nightly for sinus hygiene long-term.  Can also be used as many times daily as desired to assist with clearing congested sinuses.   spironolactone (ALDACTONE) 25 MG tablet Take 1 tablet (25 mg total) by mouth daily.   tamsulosin (FLOMAX) 0.4 MG CAPS capsule TAKE 2 CAPSULES BY MOUTH EVERY DAY   torsemide (DEMADEX) 10 MG tablet Take 10 mg by mouth daily.   vardenafil (LEVITRA) 20  MG tablet TAKE 1 TABLET BY MOUTH EVERY DAY AS NEEDED FOR ERECTILE DYSFUNCTION   No current facility-administered medications on file prior to visit.  There are no discontinued medications.    Objective   Physical Exam     10/25/2023    5:04 PM 10/25/2023    4:25 PM 10/25/2023    4:06 PM  Vitals with BMI  Height   5\' 9"   Weight   201 lbs 6 oz  BMI   29.73  Systolic 106 80 80  Diastolic 70 58 55  Pulse   61   Wt Readings from Last 10 Encounters:  10/25/23 201 lb 6.4 oz (91.4 kg)  10/22/23 206 lb 12.8 oz (93.8 kg)  10/11/23 206 lb 12.8 oz (93.8 kg)  06/24/23 198 lb 12.8 oz (90.2 kg)  06/22/23 200 lb 6.4 oz (90.9 kg)  06/21/23 202 lb 6.4 oz (91.8 kg)  05/17/23 202 lb (91.6 kg)  04/19/23 206 lb 14.4 oz (93.8 kg)  04/14/23 204 lb (92.5 kg)  03/31/23 205 lb (93 kg)   Vital signs reviewed.  Nursing notes reviewed. Weight trend reviewed. Abnormalities and Problem-Specific physical exam findings:  truncal adiposity    General Appearance:  No acute distress appreciable.   Well-groomed, healthy-appearing male.  Well proportioned with no abnormal fat distribution.  Good muscle tone. Pulmonary:  Normal work of breathing at rest, no respiratory distress apparent. SpO2: 94 %  Neurological:  Awake,  alert, oriented, and engaged.  No obvious focal neurological deficits or cognitive impairments.  Sensorium seems unclouded.   Speech is clear and coherent with logical content. Psychiatric:  Appropriate mood, pleasant and cooperative demeanor, thoughtful and engaged during the exam    No results found for any visits on 10/25/23. Office Visit on 10/11/2023  Component Date Value   Cholesterol 10/11/2023 206 (H)    Triglycerides 10/11/2023 239.0 (H)    HDL 10/11/2023 42.40    VLDL 10/11/2023 47.8 (H)    LDL Cholesterol 10/11/2023 116 (H)    Total CHOL/HDL Ratio 10/11/2023 5    NonHDL 10/11/2023 164.07    Sodium 10/11/2023 135    Potassium 10/11/2023 3.9    Chloride 10/11/2023 98    CO2  10/11/2023 30    Glucose, Bld 10/11/2023 114 (H)    BUN 10/11/2023 12    Creatinine, Ser 10/11/2023 1.00    Total Bilirubin 10/11/2023 0.4    Alkaline Phosphatase 10/11/2023 96    AST 10/11/2023 18    ALT 10/11/2023 16    Total Protein 10/11/2023 7.2    Albumin 10/11/2023 4.4    GFR 10/11/2023 79.36    Calcium 10/11/2023 9.1    WBC 10/11/2023 17.5 (H)    RBC 10/11/2023 5.36    Hemoglobin 10/11/2023 16.1    HCT 10/11/2023 47.9    MCV 10/11/2023 89.4    MCHC 10/11/2023 33.6    RDW 10/11/2023 14.0    Platelets 10/11/2023 207.0    Neutrophils Relative % 10/11/2023 77.5 (H)    Lymphocytes Relative 10/11/2023 14.1    Monocytes Relative 10/11/2023 7.2    Eosinophils Relative 10/11/2023 1.0    Basophils Relative 10/11/2023 0.2    Neutro Abs 10/11/2023 13.6 (H)    Lymphs Abs 10/11/2023 2.5    Monocytes Absolute 10/11/2023 1.3 (H)    Eosinophils Absolute 10/11/2023 0.2    Basophils Absolute 10/11/2023 0.0    Creatinine, Urine 10/11/2023 16 (L)    Protein/Creat Ratio 10/11/2023 NOTE    Protein/Creatinine Ratio 10/11/2023 NOTE    Total Protein, Urine 10/11/2023 <4 (L)    VITD 10/11/2023 38.20    Uric Acid, Serum 10/11/2023 4.1    Total Protein 10/11/2023 6.9    Albumin ELP 10/11/2023 4.3    Alpha 1 10/11/2023 0.3    Alpha 2 10/11/2023 0.6    Beta Globulin 10/11/2023 0.5    Beta 2 10/11/2023 0.4    Gamma Globulin 10/11/2023 0.9    SPE Interp. 10/11/2023     Phosphorus 10/11/2023 2.7    Magnesium 10/11/2023 2.0    TSH W/REFLEX TO FT4 10/11/2023 0.08 (L)    Free T4 10/11/2023 2.3 (H)   Lab on 06/18/2023  Component Date Value   TSH 06/18/2023 0.69   Office Visit on 04/14/2023  Component Date Value   Color, Urine 04/14/2023 YELLOW    APPearance 04/14/2023 Sl Cloudy (A)    Specific Gravity, Urine 04/14/2023 1.015    pH 04/14/2023 6.0    Total Protein, Urine 04/14/2023 NEGATIVE    Urine Glucose 04/14/2023 NEGATIVE    Ketones, ur 04/14/2023 NEGATIVE    Bilirubin Urine  04/14/2023 NEGATIVE    Hgb urine dipstick 04/14/2023 MODERATE (A)    Urobilinogen, UA 04/14/2023 0.2    Leukocytes,Ua 04/14/2023 NEGATIVE    Nitrite 04/14/2023 NEGATIVE    WBC, UA 04/14/2023 0-2/hpf    RBC / HPF 04/14/2023 0-2/hpf    Amorphous 04/14/2023 Present (A)    Sodium 04/14/2023 140  Potassium 04/14/2023 4.3    Chloride 04/14/2023 103    CO2 04/14/2023 31    Glucose, Bld 04/14/2023 89    BUN 04/14/2023 12    Creatinine, Ser 04/14/2023 1.43    GFR 04/14/2023 51.84 (L)    Calcium 04/14/2023 9.0    WBC 04/14/2023 9.8    RBC 04/14/2023 5.13    Hemoglobin 04/14/2023 15.5    HCT 04/14/2023 47.4    MCV 04/14/2023 92.4    MCHC 04/14/2023 32.7    RDW 04/14/2023 16.5 (H)    Platelets 04/14/2023 235.0    Neutrophils Relative % 04/14/2023 68.3    Lymphocytes Relative 04/14/2023 18.6    Monocytes Relative 04/14/2023 9.6    Eosinophils Relative 04/14/2023 2.6    Basophils Relative 04/14/2023 0.9    Neutro Abs 04/14/2023 6.7    Lymphs Abs 04/14/2023 1.8    Monocytes Absolute 04/14/2023 0.9    Eosinophils Absolute 04/14/2023 0.3    Basophils Absolute 04/14/2023 0.1   Office Visit on 03/31/2023  Component Date Value   Color, Urine 03/31/2023 YELLOW    APPearance 03/31/2023 CLEAR    Specific Gravity, Urine 03/31/2023 1.025    pH 03/31/2023 6.5    Total Protein, Urine 03/31/2023 NEGATIVE    Urine Glucose 03/31/2023 NEGATIVE    Ketones, ur 03/31/2023 NEGATIVE    Bilirubin Urine 03/31/2023 NEGATIVE    Hgb urine dipstick 03/31/2023 NEGATIVE    Urobilinogen, UA 03/31/2023 4.0 (A)    Leukocytes,Ua 03/31/2023 NEGATIVE    Nitrite 03/31/2023 NEGATIVE    WBC, UA 03/31/2023 3-6/hpf (A)    RBC / HPF 03/31/2023 none seen    Mucus, UA 03/31/2023 Presence of (A)    Squamous Epithelial / HPF 03/31/2023 Rare(0-4/hpf)   Admission on 03/29/2023, Discharged on 03/29/2023  Component Date Value   Lipase 03/29/2023 36    Sodium 03/29/2023 137    Potassium 03/29/2023 3.7    Chloride  03/29/2023 103    CO2 03/29/2023 27    Glucose, Bld 03/29/2023 90    BUN 03/29/2023 12    Creatinine, Ser 03/29/2023 1.05    Calcium 03/29/2023 9.0    Total Protein 03/29/2023 7.1    Albumin 03/29/2023 4.3    AST 03/29/2023 18    ALT 03/29/2023 25    Alkaline Phosphatase 03/29/2023 85    Total Bilirubin 03/29/2023 0.7    GFR, Estimated 03/29/2023 >60    Anion gap 03/29/2023 7    WBC 03/29/2023 13.2 (H)    RBC 03/29/2023 5.56    Hemoglobin 03/29/2023 16.6    HCT 03/29/2023 50.5    MCV 03/29/2023 90.8    MCH 03/29/2023 29.9    MCHC 03/29/2023 32.9    RDW 03/29/2023 15.2    Platelets 03/29/2023 235    nRBC 03/29/2023 0.0    Color, Urine 03/29/2023 YELLOW    APPearance 03/29/2023 CLEAR    Specific Gravity, Urine 03/29/2023 1.032 (H)    pH 03/29/2023 6.0    Glucose, UA 03/29/2023 NEGATIVE    Hgb urine dipstick 03/29/2023 SMALL (A)    Bilirubin Urine 03/29/2023 NEGATIVE    Ketones, ur 03/29/2023 NEGATIVE    Protein, ur 03/29/2023 30 (A)    Nitrite 03/29/2023 NEGATIVE    Leukocytes,Ua 03/29/2023 NEGATIVE    RBC / HPF 03/29/2023 0-5    WBC, UA 03/29/2023 0-5    Bacteria, UA 03/29/2023 NONE SEEN    Squamous Epithelial / HPF 03/29/2023 0-5    Mucus 03/29/2023 PRESENT    Hyaline Casts, UA  03/29/2023 PRESENT   Admission on 03/22/2023, Discharged on 03/22/2023  Component Date Value   Activated Clotting Time 03/22/2023 293    Activated Clotting Time 03/22/2023 330   Lab on 03/08/2023  Component Date Value   Glucose 03/08/2023 142 (H)    BUN 03/08/2023 12    Creatinine, Ser 03/08/2023 1.23    eGFR 03/08/2023 66    BUN/Creatinine Ratio 03/08/2023 10    Sodium 03/08/2023 141    Potassium 03/08/2023 4.0    Chloride 03/08/2023 99    CO2 03/08/2023 25    Calcium 03/08/2023 8.7    WBC 03/08/2023 10.0    RBC 03/08/2023 5.82 (H)    Hemoglobin 03/08/2023 16.9    Hematocrit 03/08/2023 53.8 (H)    MCV 03/08/2023 92    MCH 03/08/2023 29.0    MCHC 03/08/2023 31.4 (L)    RDW  03/08/2023 14.4    Platelets 03/08/2023 209   Office Visit on 03/04/2023  Component Date Value   Hemoglobin A1C 03/04/2023 6.1 (A)    POC Glucose 03/04/2023 134 (A)   Lab on 03/01/2023  Component Date Value   Free T4 03/01/2023 1.12    TSH 03/01/2023 8.05 (H)    Sodium 03/01/2023 136    Potassium 03/01/2023 3.2 (L)    Chloride 03/01/2023 94 (L)    CO2 03/01/2023 31    Glucose, Bld 03/01/2023 182 (H)    BUN 03/01/2023 14    Creatinine, Ser 03/01/2023 1.30    GFR 03/01/2023 58.18 (L)    Calcium 03/01/2023 9.0   Office Visit on 02/25/2023  Component Date Value   Sodium 02/25/2023 137    Potassium 02/25/2023 4.2    Chloride 02/25/2023 100    CO2 02/25/2023 29    Glucose, Bld 02/25/2023 119 (H)    BUN 02/25/2023 16    Creatinine, Ser 02/25/2023 1.15    GFR 02/25/2023 67.40    Calcium 02/25/2023 8.9    Aldosterone 02/25/2023 3    Renin Activity 02/25/2023 0.12 (L)    ALDO / PRA Ratio 02/25/2023 25.0    Total Volume 03/01/2023 1,500    METANEPHRINE 03/01/2023 59 (L)    NORMETANEPHRINE 03/01/2023 249    METANEPHRINES, TOTAL 03/01/2023 308   There may be more visits with results that are not included.  No image results found. Low Dose CT Chest w/o Contrast for Lung Cancer Screening [[id number]] Result Date: 10/21/2023 CLINICAL DATA:  63 year old male with 46 pack-year history of smoking. Lung cancer screening. EXAM: CT CHEST WITHOUT CONTRAST LOW-DOSE FOR LUNG CANCER SCREENING TECHNIQUE: Multidetector CT imaging of the chest was performed following the standard protocol without IV contrast. RADIATION DOSE REDUCTION: This exam was performed according to the departmental dose-optimization program which includes automated exposure control, adjustment of the mA and/or kV according to patient size and/or use of iterative reconstruction technique. COMPARISON:  11/04/2018 FINDINGS: Cardiovascular: The heart size is normal. No substantial pericardial effusion. Coronary artery calcification is  evident. Mild atherosclerotic calcification is noted in the wall of the thoracic aorta. Mediastinum/Nodes: No mediastinal lymphadenopathy. No evidence for gross hilar lymphadenopathy although assessment is limited by the lack of intravenous contrast on the current study. The esophagus has normal imaging features. There is no axillary lymphadenopathy. Lungs/Pleura: Centrilobular and paraseptal emphysema evident. 3 mm posterior left upper lobe nodule identified on image 129 today. No new suspicious pulmonary nodule or mass. No focal airspace consolidation. No pleural effusion. Pleuroparenchymal scarring again noted posterior right lower lobe. Upper Abdomen: Visualized portion of the  upper abdomen shows no acute findings. Musculoskeletal: No worrisome lytic or sclerotic osseous abnormality. IMPRESSION: Lung-RADS 2, benign appearance or behavior. Continue annual screening with low-dose chest CT without contrast in 12 months. Aortic Atherosclerosis (ICD10-I70.0) and Emphysema (ICD10-J43.9). Electronically Signed   By: Kennith Center M.D.   On: 10/21/2023 14:15       Assessment & Plan Cigarette smoker Emphysema Emphysema was confirmed on a recent CT scan and is well-managed. Smoking cessation was advised to prevent further progression. Continue current management and encourage smoking cessation. Thrush Oral Thrush White patch on the tongue consistent with oral thrush. Discussed the use of antifungal mouthwash and increasing water intake. Prescribe magic mouthwash (antifungal) and encourage increased water intake. If no improvement may need ENT referral and biopsy for possible leukoplakia return to office soon is planned Lung nodule, solitary Lung Nodule was main issue - he wanted to review LDCT today which we did A 3 mm nodule in the left upper lobe of the lung was identified on a recent CT scan. It is considered benign with a low risk of progression and is too small to biopsy. Smoking cessation was advised  to reduce the risk of growth. Ordered a follow-up CT scan in one year and encourage smoking cessation. Encouraged smoking cessation  Coronary artery disease involving native coronary artery of native heart without angina pectoris Coronary Artery Disease with Atherosclerosis CT scan revealed coronary artery calcifications and mild atherosclerosis. Cholesterol medication is ongoing. Discussed the importance of lifestyle modifications, including smoking cessation. Continue current cholesterol medication and encourage lifestyle modifications, including smoking cessation. Primary hypertension Hypotension today, possibly due to clonidine dosing which he is  uncertain about, has 0.1 and 0.2 patches. Blood pressure recorded at 80/50 mmHg. Currently on multiple antihypertensive medications (clonidine patch and losartan) but is asymptomatic. Discussed reducing antihypertensive dosages. Reduce clonidine patch to 0.1 mg and losartan to half a tablet. Monitor blood pressure at home and return to the clinic in 2-4 weeks for blood pressure review.  General Health Maintenance Advised to maintain regular health screenings and lifestyle modifications to manage existing conditions. Encourage smoking cessation and monitor blood pressure at home.  Follow-up Return to the clinic in 2-4 weeks for blood pressure review and order a follow-up CT scan in one year.     Orders Placed During this Encounter:   Orders Placed This Encounter  Procedures   Low Dose CT Chest w/o Contrast for Lung Cancer Screening [[id number]]    Standing Status:   Future    Expected Date:   10/18/2024    Expiration Date:   10/24/2024    Preferred Imaging Location?:   [id number]   Meds ordered this encounter  Medications   magic mouthwash (nystatin, lidocaine, diphenhydrAMINE, alum & mag hydroxide) suspension    Sig: Swish and swallow 5 mLs 4 (four) times daily.    Dispense:  180 mL    Refill:  0    This document was synthesized by  artificial intelligence ([employer]) using HIPAA-compliant recording of the clinical interaction;   We discussed the use of AI scribe software for clinical note transcription with the patient, who gave verbal consent to proceed.    Additional Info: This encounter employed state-of-the-art, real-time, collaborative documentation. The patient actively reviewed and assisted in updating their electronic medical record on a shared screen, ensuring transparency and facilitating joint problem-solving for the problem list, overview, and plan. This approach promotes accurate, informed care. The treatment plan was discussed and reviewed in detail, including  medication safety, potential side effects, and all patient questions. We confirmed understanding and comfort with the plan. Follow-up instructions were established, including contacting the office for any concerns, returning if symptoms worsen, persist, or new symptoms develop, and precautions for potential emergency department visits.

## 2023-11-10 ENCOUNTER — Ambulatory Visit: Payer: 59 | Admitting: Internal Medicine

## 2023-11-16 ENCOUNTER — Other Ambulatory Visit: Payer: 59

## 2023-11-22 ENCOUNTER — Ambulatory Visit: Payer: 59 | Admitting: Internal Medicine

## 2023-12-13 ENCOUNTER — Encounter: Payer: Self-pay | Admitting: Internal Medicine

## 2023-12-13 ENCOUNTER — Other Ambulatory Visit: Payer: Self-pay | Admitting: Internal Medicine

## 2023-12-13 DIAGNOSIS — Z634 Disappearance and death of family member: Secondary | ICD-10-CM

## 2023-12-13 DIAGNOSIS — G4709 Other insomnia: Secondary | ICD-10-CM

## 2023-12-13 DIAGNOSIS — N1831 Chronic kidney disease, stage 3a: Secondary | ICD-10-CM

## 2023-12-13 DIAGNOSIS — F4321 Adjustment disorder with depressed mood: Secondary | ICD-10-CM

## 2023-12-13 NOTE — Telephone Encounter (Signed)
 LOV:10/25/2023 NEXT VISIT:n/a LAST FILLED:10/11/2023 QUAT:20

## 2023-12-21 ENCOUNTER — Ambulatory Visit (HOSPITAL_COMMUNITY)
Admission: RE | Admit: 2023-12-21 | Discharge: 2023-12-21 | Disposition: A | Discharge status: Home / Self Care | Payer: 59 | Source: Ambulatory Visit | Attending: Internal Medicine | Admitting: Internal Medicine

## 2023-12-21 ENCOUNTER — Encounter (HOSPITAL_COMMUNITY): Payer: Self-pay | Admitting: Internal Medicine

## 2023-12-21 ENCOUNTER — Other Ambulatory Visit: Payer: Self-pay

## 2023-12-21 VITALS — BP 124/82 | HR 68

## 2023-12-21 DIAGNOSIS — I5020 Unspecified systolic (congestive) heart failure: Secondary | ICD-10-CM | POA: Diagnosis not present

## 2023-12-21 DIAGNOSIS — Z7901 Long term (current) use of anticoagulants: Secondary | ICD-10-CM | POA: Insufficient documentation

## 2023-12-21 DIAGNOSIS — Z634 Disappearance and death of family member: Secondary | ICD-10-CM | POA: Insufficient documentation

## 2023-12-21 DIAGNOSIS — D6869 Other thrombophilia: Secondary | ICD-10-CM | POA: Diagnosis not present

## 2023-12-21 DIAGNOSIS — E785 Hyperlipidemia, unspecified: Secondary | ICD-10-CM | POA: Insufficient documentation

## 2023-12-21 DIAGNOSIS — I4819 Other persistent atrial fibrillation: Secondary | ICD-10-CM | POA: Diagnosis not present

## 2023-12-21 DIAGNOSIS — I110 Hypertensive heart disease with heart failure: Secondary | ICD-10-CM | POA: Insufficient documentation

## 2023-12-21 NOTE — Patient Instructions (Addendum)
 Dr Nelly Laurence in 6 months

## 2023-12-21 NOTE — Progress Notes (Signed)
 Primary Care Physician: Jesse Olszewski, MD Primary Cardiologist: None Electrophysiologist: Jesse Small, MD  Referring Physician: Dr Jesse Suarez [name] is a 63 y.o. male with a history of HLD, HTN, CHF, atrial fibrillation who presents for follow up in the [facility]. He was diagnosed with AF years ago in the setting of thyrotoxicosis. Despite having this thyroid disease under control, he had recurrence of AF and was placed on amiodarone. He had a DCCV on 10/22/22 but had quick return of afib. He was seen by Jesse Suarez and underwent afib ablation on 03/22/23. Patient is on Xarelto for a CHADS2VASC score of 2.  On follow up 12/21/23, he is currently in NSR. He has had no episodes of Afib since last office visit with Jesse. Jesse Suarez. Amiodarone was stopped at last office visit with Jesse. Jesse Suarez. He unfortunately lost his wife in August and has been dealing with his loss. No bleeding issues on Xarelto 20 mg daily.   Today, he denies symptoms of palpitations, chest pain, shortness of breath, orthopnea, PND, lower extremity edema, dizziness, presyncope, syncope, snoring, daytime somnolence, bleeding, or neurologic sequela. The patient is tolerating medications without difficulties and is otherwise without complaint today.    Atrial Fibrillation Risk Factors:  he does not have symptoms or diagnosis of sleep apnea. he does not have a history of rheumatic fever.   Atrial Fibrillation Management history:  Previous antiarrhythmic drugs: amiodarone  Previous cardioversions: 09/15/22, 10/22/22 Previous ablations: 03/22/23 Anticoagulation history: Xarelto   ROS- All systems are reviewed and negative except as per the HPI above.   Physical Exam: BP 124/82   Pulse 68   GEN- The patient is well appearing, alert and oriented x 3 today.   Neck - no JVD or carotid bruit noted Lungs- Clear to ausculation bilaterally, normal work of breathing Heart- Regular rate and  rhythm, no murmurs, rubs or gallops, PMI not laterally displaced Extremities- no clubbing, cyanosis, or edema Skin - no rash or ecchymosis noted   Wt Readings from Last 3 Encounters:  10/25/23 91.4 kg  10/22/23 93.8 kg  10/11/23 93.8 kg     EKG today demonstrates  Vent. rate 68 BPM PR interval 122 ms QRS duration 94 ms QT/QTcB 412/438 ms P-R-T axes 63 86 81 Normal sinus rhythm Normal ECG When compared with ECG of 22-Jun-2023 15:01, PREVIOUS ECG IS PRESENT  Echo 09/12/22 demonstrated   1. Left ventricular ejection fraction, by estimation, is 40 to 45%. The  left ventricle has mildly decreased function. The left ventricle  demonstrates global hypokinesis. There is mild concentric left ventricular  hypertrophy. Left ventricular diastolic function could not be evaluated.   2. Right ventricular systolic function is normal. The right ventricular  size is mildly enlarged. There is normal pulmonary artery systolic  pressure. The estimated right ventricular systolic pressure is 21.3 mmHg.   3. Left atrial size was moderately dilated.   4. Right atrial size was moderately dilated.   5. The mitral valve is normal in structure. Mild mitral valve  regurgitation.   6. The aortic valve is tricuspid. Aortic valve regurgitation is not  visualized. No aortic stenosis is present.   7. The inferior vena cava is normal in size with greater than 50%  respiratory variability, suggesting right atrial pressure of 3 mmHg.    CHA2DS2-VASc Score = 3  The patient's score is based upon: CHF History: 1 HTN History: 1 Diabetes History: 0 Stroke History: 0 Vascular  Disease History: 1 Age Score: 0 Gender Score: 0       ASSESSMENT AND PLAN: Persistent Atrial Fibrillation (ICD10:  I48.19) The patient's CHA2DS2-VASc score is 3, indicating a 3.2% annual risk of stroke.   S/p afib ablation 03/22/23 by Jesse. Jesse Suarez.  He is currently in NSR. Noted by Jesse. Jesse Suarez to have failed amiodarone. Continue current  regimen without change.   Secondary Hypercoagulable State (ICD10:  D68.69) The patient is at significant risk for stroke/thromboembolism based upon his CHA2DS2-VASc Score of 3.  Continue Rivaroxaban (Xarelto).  Continue without interruption.   HTN Stable today.  HFmrEF He appears euvolemic today.     Follow up 6 months with Jesse. Jesse Suarez.   Jesse Mend, PA-C Afib Clinic [facility] [address] [place], [place] [address] [phone]

## 2024-01-11 ENCOUNTER — Other Ambulatory Visit: Payer: Self-pay

## 2024-01-17 ENCOUNTER — Other Ambulatory Visit: Payer: 59

## 2024-01-17 DIAGNOSIS — R310 Gross hematuria: Secondary | ICD-10-CM | POA: Diagnosis not present

## 2024-01-17 DIAGNOSIS — E890 Postprocedural hypothyroidism: Secondary | ICD-10-CM | POA: Diagnosis not present

## 2024-01-18 LAB — TSH(REFL): TSH: 0.05 m[IU]/L — ABNORMAL LOW (ref 0.40–4.50)

## 2024-01-18 LAB — REFLEX TIQ

## 2024-01-21 ENCOUNTER — Ambulatory Visit: Payer: 59 | Admitting: "Endocrinology

## 2024-01-24 DIAGNOSIS — I509 Heart failure, unspecified: Secondary | ICD-10-CM | POA: Diagnosis not present

## 2024-01-24 DIAGNOSIS — Z7901 Long term (current) use of anticoagulants: Secondary | ICD-10-CM | POA: Diagnosis not present

## 2024-01-24 DIAGNOSIS — Z833 Family history of diabetes mellitus: Secondary | ICD-10-CM | POA: Diagnosis not present

## 2024-01-24 DIAGNOSIS — I110 Hypertensive heart disease with heart failure: Secondary | ICD-10-CM | POA: Diagnosis not present

## 2024-01-24 DIAGNOSIS — Z8249 Family history of ischemic heart disease and other diseases of the circulatory system: Secondary | ICD-10-CM | POA: Diagnosis not present

## 2024-01-24 DIAGNOSIS — I25119 Atherosclerotic heart disease of native coronary artery with unspecified angina pectoris: Secondary | ICD-10-CM | POA: Diagnosis not present

## 2024-01-24 DIAGNOSIS — D6869 Other thrombophilia: Secondary | ICD-10-CM | POA: Diagnosis not present

## 2024-01-24 DIAGNOSIS — E039 Hypothyroidism, unspecified: Secondary | ICD-10-CM | POA: Diagnosis not present

## 2024-01-24 DIAGNOSIS — N400 Enlarged prostate without lower urinary tract symptoms: Secondary | ICD-10-CM | POA: Diagnosis not present

## 2024-01-24 DIAGNOSIS — E785 Hyperlipidemia, unspecified: Secondary | ICD-10-CM | POA: Diagnosis not present

## 2024-01-24 DIAGNOSIS — I4891 Unspecified atrial fibrillation: Secondary | ICD-10-CM | POA: Diagnosis not present

## 2024-01-24 DIAGNOSIS — M62838 Other muscle spasm: Secondary | ICD-10-CM | POA: Diagnosis not present

## 2024-01-26 ENCOUNTER — Encounter: Payer: Self-pay | Admitting: "Endocrinology

## 2024-01-26 ENCOUNTER — Ambulatory Visit (INDEPENDENT_AMBULATORY_CARE_PROVIDER_SITE_OTHER): Admitting: "Endocrinology

## 2024-01-26 VITALS — BP 104/80 | HR 65 | Ht 69.0 in | Wt 203.0 lb

## 2024-01-26 DIAGNOSIS — E890 Postprocedural hypothyroidism: Secondary | ICD-10-CM | POA: Diagnosis not present

## 2024-01-26 MED ORDER — LEVOTHYROXINE SODIUM 200 MCG PO TABS: 200.0000 ug | ORAL_TABLET | Freq: Every day | ORAL | 1 refills | Status: AC

## 2024-01-26 NOTE — Progress Notes (Signed)
 Outpatient Endocrinology Note Jorge Newcomer, MD  01/26/24   Jesse MANRRIQUEZ [name] August 21, 1961 [id number]  Referring Provider: Anthon Kins, MD Primary Care Provider: Anthon Kins, MD Subjective  No chief complaint on file.   Assessment & Plan  Diagnoses and all orders for this visit:  Hypothyroidism following radioiodine therapy    Jesse SIRI [name] is currently taking synthroid  200 mcg Mon-Sat and half a pill (100mcg) on Sunday. TSH 0.05  Patient is currently biochemically euthyroid.  Educated on thyroid  axis.  10/22/23: Recommend the following: Take synthroid  200 mcg Mon-Sat and skip on Sunday. Take levothyroxine  first thing in the morning on empty stomach and wait at least 30 minutes to 1 hour before eating or drinking anything or taking any other medications. Space out levothyroxine  by 4 hours from any acid reflux medication/fibrate/iron/calcium /multivitamin. Advised to take nutritional supplements in the evening. Repeat lab before next visit or sooner if symptoms of hyperthyroidism or hypothyroidism develop.  Notify us  immediately in case of significant weight gain or loss. Counseled on compliance and follow up needs.  Previously, the patient was counseled on the dangers of tobacco use, and was advised to quit.  Reviewed strategies to maximize success, including removing cigarettes and smoking materials from environment and written materials. Has nicotine  patches from PCP to curb nicotine  dependence    I have reviewed current medications, nurse's notes, allergies, vital signs, past medical and surgical history, family medical history, and social history for this encounter. Counseled patient on symptoms, examination findings, lab findings, imaging results, treatment decisions and monitoring and prognosis. The patient understood the recommendations and agrees with the treatment plan. All questions regarding treatment plan were fully answered.   Return in about 3  months (around 04/27/2024) for visit + labs before next visit.   Jorge Newcomer, MD  01/26/24   I have reviewed current medications, nurse's notes, allergies, vital signs, past medical and surgical history, family medical history, and social history for this encounter. Counseled patient on symptoms, examination findings, lab findings, imaging results, treatment decisions and monitoring and prognosis. The patient understood the recommendations and agrees with the treatment plan. All questions regarding treatment plan were fully answered.   History of Present Illness Jesse ZWIERS [name] is a 63 y.o. year old male who presents to our clinic with post-ablative hypothyroidism.    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   Symptoms suggestive of HYPOTHYROIDISM:  fatigue Yes weight gain No cold intolerance  Yes constipation  No  Symptoms suggestive of HYPERTHYROIDISM:  weight loss  No heat intolerance No hyperdefecation  No palpitations  No  Compressive symptoms:  dysphagia  No dysphonia  No positional dyspnea (especially with simultaneous arms elevation)  No  Smokes  Yes On biotin  No Personal history of head/neck surgery/irradiation  Yes   Physical Exam  BP 104/80   Pulse 65   Ht 5\' 9"  (1.753 m)   Wt 203 lb (92.1 kg)   SpO2 97%   BMI 29.98 kg/m  Constitutional: well developed, well nourished Head: normocephalic, atraumatic, no exophthalmos Eyes: sclera anicteric, no redness Neck: no thyromegaly, no thyroid  tenderness; no nodules palpated Lungs: normal respiratory effort Neurology: alert and oriented, no fine hand tremor Skin: dry, no appreciable rashes Musculoskeletal: no appreciable defects Psychiatric: normal mood and affect  Allergies Allergies  Allergen Reactions   Sulfa Drugs Cross Reactors Hives   Codeine Other (See Comments)    Made him very jittery    Hydrochlorothiazide   Other (See Comments)    Hyponatremia    Current  Medications Patient's Medications  New Prescriptions   No medications on file  Previous Medications   AMOXICILLIN -CLAVULANATE (AUGMENTIN ) 875-125 MG TABLET    Take 1 tablet by mouth 2 (two) times daily.   CHOLECALCIFEROL (VITAMIN D -3) 125 MCG (5000 UT) TABS    Take 5,000 Units by mouth daily.   CLONIDINE  (CATAPRES  - DOSED IN MG/24 HR) 0.1 MG/24HR PATCH    PLACE 1 PATCH (0.1 MG TOTAL) ONTO THE SKIN ONCE A WEEK.   CLONIDINE  (CATAPRES  - DOSED IN MG/24 HR) 0.2 MG/24HR PATCH    0.2 mg once a week.   EDEX 20 MCG INJECTION    SMARTSIG:1 Milliliter(s) Intracavernosally Every Other Day PRN   EZETIMIBE  (ZETIA ) 10 MG TABLET    Take 1 tablet (10 mg total) by mouth daily.   LEVALBUTEROL  (XOPENEX  HFA) 45 MCG/ACT INHALER    Inhale 1 puff into the lungs 4 (four) times daily.   LEVOTHYROXINE  (SYNTHROID ) 200 MCG TABLET    Take 1 tablet (200 mcg total) by mouth daily. 200 mg daily except skip on Sundays   LORATADINE  (CLARITIN ) 10 MG TABLET    Take 1 tablet (10 mg total) by mouth daily.   LORAZEPAM  (ATIVAN ) 1 MG TABLET    Take 1 tablet (1 mg total) by mouth 2 (two) times daily as needed for anxiety or sleep.   LOSARTAN  (COZAAR ) 100 MG TABLET    Take 1 tablet (100 mg total) by mouth daily.   MAGIC MOUTHWASH (NYSTATIN , LIDOCAINE , DIPHENHYDRAMINE , ALUM & MAG HYDROXIDE) SUSPENSION    Swish and swallow 5 mLs 4 (four) times daily.   METOPROLOL  TARTRATE (LOPRESSOR ) 50 MG TABLET    Take 50 mg by mouth 2 (two) times daily.   MONTELUKAST  (SINGULAIR ) 10 MG TABLET    Take 1 tablet (10 mg total) by mouth at bedtime. TAKE 1 TABLET BY MOUTH EVERYDAY AT BEDTIME Strength: 10 mg   MULTIPLE VITAMINS-MINERALS (MULTIVITAMIN WITH MINERALS) TABLET    Take 1 tablet by mouth daily.   RIVAROXABAN  (XARELTO ) 20 MG TABS TABLET    Take 1 tablet (20 mg total) by mouth daily with supper.   ROSUVASTATIN  (CRESTOR ) 40 MG TABLET    Take 1 tablet (40 mg total) by mouth daily. Replaces 20 mg dose   SALINE (SIMPLY SALINE) 0.9 % AERS    Place 2 each  into the nose as directed. Use nightly for sinus hygiene long-term.  Can also be used as many times daily as desired to assist with clearing congested sinuses.   SPIRONOLACTONE  (ALDACTONE ) 25 MG TABLET    Take 1 tablet (25 mg total) by mouth daily.   TAMSULOSIN  (FLOMAX ) 0.4 MG CAPS CAPSULE    TAKE 2 CAPSULES BY MOUTH EVERY DAY   TORSEMIDE  (DEMADEX ) 10 MG TABLET    Take 10 mg by mouth daily.   VARDENAFIL  (LEVITRA ) 20 MG TABLET    TAKE 1 TABLET BY MOUTH EVERY DAY AS NEEDED FOR ERECTILE DYSFUNCTION  Modified Medications   No medications on file  Discontinued Medications   No medications on file    Past Medical History Past Medical History:  Diagnosis Date   Abdominal pain 03/31/2023   Epigastric and diffuse, emergency room visit did excellent evaluation. Seems to be improving with protonix  and hold colchicine .  Leucocytosis noted     Acute hypoxemic respiratory failure (HCC) 09/18/2022   Acute kidney injury (HCC) 05/09/2022   Microscopic hematuria (R31.9): Updated 04/14/23. Moderate blood in  urine. Renal ultrasound and specialist referrals ordered.  AKI vs Chronic kidney disease, stage 3a (N18.31): New diagnosis 04/14/23. GFR 51.84, creatinine 1.43. Nephrology referral placed.  Benign prostatic hyperplasia (N40.1): Needs follow-up. Last seen by urology several years ago. Urology referral placed for re-evaluation.  Prot   Allergy    Anxiety    Atrial fibrillation (HCC)    Atrial fibrillation with RVR (HCC) 09/12/2022   Bell palsy    Depression    Diastolic heart failure    HAP (hospital-acquired pneumonia) 09/17/2022   Hypercholesterolemia    Hypertension    Hypokalemia 03/07/2023      moderate (2.8 - 3.5 meq/dl)  Recent started spironolactone           Lab Results      Component    Value    Date/Time           K    3.7    03/29/2023 05:26 PM           K    4.0    03/08/2023 11:04 AM           K    3.2 (L)    03/01/2023 10:09 AM           K    4.2    02/25/2023 03:37 PM           K    3.9     11/30/2022 10:29 AM           K    3.9    10/19/2022 10:03 AM           K    4.3    01   Pneumonia    Pneumonia of right lower lobe due to infectious organism 09/17/2022   Thyroid  disease     Past Surgical History Past Surgical History:  Procedure Laterality Date   ATRIAL FIBRILLATION ABLATION N/A 03/22/2023   Procedure: ATRIAL FIBRILLATION ABLATION;  Surgeon: Efraim Grange, MD;  Location: [facility];  Service: Cardiovascular;  Laterality: N/A;   CARDIOVERSION N/A 09/15/2022   Procedure: CARDIOVERSION;  Surgeon: Jann Melody, MD;  Location: [facility] ENDOSCOPY;  Service: Cardiovascular;  Laterality: N/A;   CARDIOVERSION N/A 10/22/2022   Procedure: CARDIOVERSION;  Surgeon: Hazle Lites, MD;  Location: [facility]OS[facility];  Service: Cardiovascular;  Laterality: N/A;   CHOLECYSTECTOMY     HERNIA REPAIR     KNEE SURGERY     VASECTOMY      Family History family history includes Alcohol abuse in his father; Cirrhosis in his father; Heart attack in his mother; Heart disease in his mother; Hypertension in his father; Kidney cancer in his mother; Mental illness in his brother; Other in his father and mother; Parkinsonism in his brother.  Social History Social History   Socioeconomic History   Marital status: Married    Spouse name: Not on file   Number of children: 1   Years of education: Not on file   Highest education level: Not on file  Occupational History   Not on file  Tobacco Use   Smoking status: Every Day    Current packs/day: 0.50    Average packs/day: 0.5 packs/day for 45.4 years (22.7 ttl pk-yrs)    Types: Cigarettes    Start date: 09/14/1978   Smokeless tobacco: Never  Vaping Use   Vaping status: Never Used  Substance and Sexual Activity   Alcohol use: Yes    Alcohol/week: 0.0 standard drinks of alcohol  Comment: rarely   Drug use: No   Sexual activity: Never    Birth control/protection: None  Other Topics Concern   Not on file  Social History  Narrative   Marital status: married x 30+ years      Children:  2 children;(38,37); 3+3 grandchild      Lives:  With wife, son      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      Tobacco: 1 ppd x 30 years      Alcohol: rare     Regular exercise: walking daily   Caffeine use: daily      Social Drivers of Corporate investment banker Strain: Not on file  Food Insecurity: No Food Insecurity (09/13/2022)   Hunger Vital Sign    Worried About Running Out of Food in the Last Year: Never true    Ran Out of Food in the Last Year: Never true  Transportation Needs: No Transportation Needs (09/13/2022)   PRAPARE - Administrator, Civil Service (Medical): No    Lack of Transportation (Non-Medical): No  Physical Activity: Not on file  Stress: Not on file  Social Connections: Unknown (01/27/2022)   Received from [facility]   Social Network    Social Network: Not on file  Intimate Partner Violence: Not At Risk (09/13/2022)   Humiliation, Afraid, Rape, and Kick questionnaire    Fear of Current or Ex-Partner: No    Emotionally Abused: No    Physically Abused: No    Sexually Abused: No    Laboratory Investigations Lab Results  Component Value Date   TSH 0.69 06/18/2023   TSH 8.05 (H) 03/01/2023   TSH 0.22 (L) 11/30/2022   FREET4 2.3 (H) 10/11/2023   FREET4 1.12 03/01/2023   FREET4 1.52 11/30/2022     No results found for: "TSI"   No components found for: "TRAB"   Lab Results  Component Value Date   CHOL 206 (H) 10/11/2023   Lab Results  Component Value Date   HDL 42.40 10/11/2023   Lab Results  Component Value Date   LDLCALC 116 (H) 10/11/2023   Lab Results  Component Value Date   TRIG 239.0 (H) 10/11/2023   Lab Results  Component Value Date   CHOLHDL 5 10/11/2023   Lab Results  Component Value Date   CREATININE 1.00 10/11/2023   Lab Results  Component Value Date   GFR 79.36 10/11/2023      Component Value Date/Time    NA 135 10/11/2023 1417   NA 141 03/08/2023 1104   K 3.9 10/11/2023 1417   CL 98 10/11/2023 1417   CO2 30 10/11/2023 1417   GLUCOSE 114 (H) 10/11/2023 1417   BUN 12 10/11/2023 1417   BUN 12 03/08/2023 1104   CREATININE 1.00 10/11/2023 1417   CREATININE 0.98 01/06/2017 1617   CALCIUM  9.1 10/11/2023 1417   PROT 7.2 10/11/2023 1417   PROT 6.9 10/11/2023 1417   PROT 6.6 10/19/2022 1003   ALBUMIN 4.4 10/11/2023 1417   ALBUMIN 4.3 10/19/2022 1003   AST 18 10/11/2023 1417   ALT 16 10/11/2023 1417   ALKPHOS 96 10/11/2023 1417   BILITOT 0.4 10/11/2023 1417   BILITOT 0.5 10/19/2022 1003   GFRNONAA >60 03/29/2023 1726   GFRNONAA >89 05/22/2016 1308   GFRAA >60 09/30/2019 1937   GFRAA >89 05/22/2016 1308      Latest Ref Rng & Units 10/11/2023    2:17 PM  04/14/2023    2:56 PM 03/29/2023    5:26 PM  BMP  Glucose 70 - 99 mg/dL 756  89  90   BUN 6 - 23 mg/dL 12  12  12    Creatinine 0.40 - 1.50 mg/dL 4.33  2.95  1.88   Sodium 135 - 145 mEq/L 135  140  137   Potassium 3.5 - 5.1 mEq/L 3.9  4.3  3.7   Chloride 96 - 112 mEq/L 98  103  103   CO2 19 - 32 mEq/L 30  31  27    Calcium  8.4 - 10.5 mg/dL 9.1  9.0  9.0        Component Value Date/Time   WBC 17.5 (H) 10/11/2023 1417   RBC 5.36 10/11/2023 1417   HGB 16.1 10/11/2023 1417   HGB 16.9 03/08/2023 1104   HCT 47.9 10/11/2023 1417   HCT 53.8 (H) 03/08/2023 1104   PLT 207.0 10/11/2023 1417   PLT 209 03/08/2023 1104   MCV 89.4 10/11/2023 1417   MCV 92 03/08/2023 1104   MCH 29.9 03/29/2023 1726   MCHC 33.6 10/11/2023 1417   RDW 14.0 10/11/2023 1417   RDW 14.4 03/08/2023 1104   LYMPHSABS 2.5 10/11/2023 1417   MONOABS 1.3 (H) 10/11/2023 1417   EOSABS 0.2 10/11/2023 1417   BASOSABS 0.0 10/11/2023 1417      Parts of this note may have been dictated using voice recognition software. There may be variances in spelling and vocabulary which are unintentional. Not all errors are proofread. Please notify the [facility] if any discrepancies are  noted or if the meaning of any statement is not clear.

## 2024-03-01 ENCOUNTER — Other Ambulatory Visit: Payer: Self-pay | Admitting: Internal Medicine

## 2024-03-01 DIAGNOSIS — I1A0 Resistant hypertension: Secondary | ICD-10-CM

## 2024-03-01 MED ORDER — SPIRONOLACTONE 25 MG PO TABS: 25.0000 mg | ORAL_TABLET | Freq: Every day | ORAL | 3 refills | Status: AC

## 2024-03-01 NOTE — Telephone Encounter (Signed)
 Copied from CRM [id number]. Topic: Clinical - Medication Refill >> Mar 01, 2024  1:13 PM [place] wrote: Medication: spironolactone  (ALDACTONE ) 25 MG tablet   Has the patient contacted their pharmacy? Yes (Agent: If no, request that the patient contact the pharmacy for the refill. If patient does not wish to contact the pharmacy document the reason why and proceed with request.) (Agent: If yes, when and what did the pharmacy advise?)  This is the patient's preferred pharmacy:  [id number] - [place], [place] - [address] [place] [address] Phone: [phone] Fax: [phone]   Is this the correct pharmacy for this prescription? Yes If no, delete pharmacy and type the correct one.   Has the prescription been filled recently? Yes  Is the patient out of the medication? Yes  Has the patient been seen for an appointment in the last year OR does the patient have an upcoming appointment? Yes  Can we respond through MyChart? Yes  Agent: Please be advised that Rx refills may take up to 3 business days. We ask that you follow-up with your pharmacy.

## 2024-03-09 ENCOUNTER — Other Ambulatory Visit: Payer: Self-pay | Admitting: Internal Medicine

## 2024-03-09 DIAGNOSIS — I10 Essential (primary) hypertension: Secondary | ICD-10-CM

## 2024-03-09 DIAGNOSIS — I2510 Atherosclerotic heart disease of native coronary artery without angina pectoris: Secondary | ICD-10-CM

## 2024-03-10 ENCOUNTER — Telehealth: Payer: Self-pay | Admitting: Internal Medicine

## 2024-03-10 NOTE — Telephone Encounter (Unsigned)
 Copied from CRM [id number]. Topic: Clinical - Medication Refill >> Mar 10, 2024  2:08 PM Melissa C wrote: Medication: torsemide  (DEMADEX ) 10 MG tablet  Has the patient contacted their pharmacy? Yes (Agent: If no, request that the patient contact the pharmacy for the refill. If patient does not wish to contact the pharmacy document the reason why and proceed with request.) (Agent: If yes, when and what did the pharmacy advise?)  NOTE: patient stated pharmacy said stated they tried calling it in and haven't heard anything  This is the patient's preferred pharmacy:  [id number] - [place], [place] - [address] [place] [address] Phone: [phone] Fax: [phone]   Is this the correct pharmacy for this prescription? Yes If no, delete pharmacy and type the correct one.   Has the prescription been filled recently? No  Is the patient out of the medication? Yes  Has the patient been seen for an appointment in the last year OR does the patient have an upcoming appointment? Yes  Can we respond through MyChart? No  Agent: Please be advised that Rx refills may take up to 3 business days. We ask that you follow-up with your pharmacy.

## 2024-03-11 MED ORDER — TORSEMIDE 10 MG PO TABS: 10.0000 mg | ORAL_TABLET | Freq: Every day | ORAL | 3 refills | Status: AC

## 2024-03-12 ENCOUNTER — Other Ambulatory Visit: Payer: Self-pay | Admitting: Internal Medicine

## 2024-03-12 DIAGNOSIS — M51369 Other intervertebral disc degeneration, lumbar region without mention of lumbar back pain or lower extremity pain: Secondary | ICD-10-CM

## 2024-03-13 ENCOUNTER — Telehealth: Payer: Self-pay | Admitting: *Deleted

## 2024-03-13 ENCOUNTER — Encounter: Payer: Self-pay | Admitting: Cardiovascular Disease

## 2024-03-13 NOTE — Telephone Encounter (Signed)
 error

## 2024-03-13 NOTE — Telephone Encounter (Signed)
   Pre-operative Risk Assessment    Patient Name: Jesse ABDALLAH [name]  DOB: [birth year] MRN: [id number]   Date of last office visit: 06/22/23 DR. MEALOR Date of next office visit: 06/05/24 DAPHNE BARRACK, NP  Request for Surgical Clearance    Procedure:  Dental Extraction - Amount of Teeth to be Pulled:  3 SURGICAL EXTRACTIONS  Date of Surgery:  Clearance TBD                                Surgeon:  DR. SUAN CREA, DMD Surgeon's Group or Practice Name:  SU[facility] CRE[facility], DMD Phone number:  [phone] Fax number:  [phone]   Type of Clearance Requested:   - Medical  - Pharmacy:  Hold Rivaroxaban  (Xarelto )     Type of Anesthesia:  Local    Additional requests/questions:    [name]   03/13/2024, 2:43 PM

## 2024-03-13 NOTE — Telephone Encounter (Signed)
 Pharmacy please advise on holding Xarelto  prior to surgical extraction of 3 teeth scheduled for TBD. Thank you.

## 2024-03-14 ENCOUNTER — Telehealth: Payer: Self-pay

## 2024-03-14 NOTE — Telephone Encounter (Signed)
 Transferred to [facility]

## 2024-03-14 NOTE — Telephone Encounter (Signed)
 Pt is scheduled for a telle appt on 07/07/025 with Damien Braver, NP; OK per Niels Jest, CMA. Med rec and consent are done.

## 2024-03-14 NOTE — Telephone Encounter (Signed)
 Left message for pt to call to schedule a tele preop appt.

## 2024-03-14 NOTE — Telephone Encounter (Signed)
 Tried to call patient, had to leave a VM. Asked for him to give us  a call back and ask for the Pre-op  team.

## 2024-03-14 NOTE — Telephone Encounter (Signed)
 Patient is returning call. Please advise?

## 2024-03-14 NOTE — Telephone Encounter (Signed)
 Pt is scheduled for a Tele appt 03/20/24 with Damien Braver, NP; OK per Niels Jest, CMA. Med rec and consent are done.      Patient Consent for Virtual Visit        Jesse GREY [name] has provided verbal consent on 03/14/2024 for a virtual visit (video or telephone).   CONSENT FOR VIRTUAL VISIT FOR:  Jesse JONELLE Sollenberger [name]  By participating in this virtual visit I agree to the following:  I hereby voluntarily request, consent and authorize [facility] and its employed or contracted physicians, physician assistants, nurse practitioners or other licensed health care professionals (the Practitioner), to provide me with telemedicine health care services (the "Services) as deemed necessary by the treating Practitioner. I acknowledge and consent to receive the Services by the Practitioner via telemedicine. I understand that the telemedicine visit will involve communicating with the Practitioner through live audiovisual communication technology and the disclosure of certain medical information by electronic transmission. I acknowledge that I have been given the opportunity to request an in-person assessment or other available alternative prior to the telemedicine visit and am voluntarily participating in the telemedicine visit.  I understand that I have the right to withhold or withdraw my consent to the use of telemedicine in the course of my care at any time, without affecting my right to future care or treatment, and that the Practitioner or I may terminate the telemedicine visit at any time. I understand that I have the right to inspect all information obtained and/or recorded in the course of the telemedicine visit and may receive copies of available information for a reasonable fee.  I understand that some of the potential risks of receiving the Services via telemedicine include:  Delay or interruption in medical evaluation due to technological equipment failure or disruption; Information  transmitted may not be sufficient (e.g. poor resolution of images) to allow for appropriate medical decision making by the Practitioner; and/or  In rare instances, security protocols could fail, causing a breach of personal health information.  Furthermore, I acknowledge that it is my responsibility to provide information about my medical history, conditions and care that is complete and accurate to the best of my ability. I acknowledge that Practitioner's advice, recommendations, and/or decision may be based on factors not within their control, such as incomplete or inaccurate data provided by me or distortions of diagnostic images or specimens that may result from electronic transmissions. I understand that the practice of medicine is not an exact science and that Practitioner makes no warranties or guarantees regarding treatment outcomes. I acknowledge that a copy of this consent can be made available to me via my patient portal [facility] MyChart), or I can request a printed copy by calling the office of [facility].    I understand that my insurance will be billed for this visit.   I have read or had this consent read to me. I understand the contents of this consent, which adequately explains the benefits and risks of the Services being provided via telemedicine.  I have been provided ample opportunity to ask questions regarding this consent and the Services and have had my questions answered to my satisfaction. I give my informed consent for the services to be provided through the use of telemedicine in my medical care

## 2024-03-14 NOTE — Telephone Encounter (Signed)
   Name: Jesse MARSIGLIA [name]  DOB: [birth year]  MRN: [id number]  Primary Cardiologist: None   Preoperative team, please contact this patient and set up a phone call appointment for further preoperative risk assessment. Please obtain consent and complete medication review. Thank you for your help.  I confirm that guidance regarding antiplatelet and oral anticoagulation therapy has been completed and, if necessary, noted below.  Patient does not require pre-op  antibiotics for dental procedure.   Per office protocol, patient can hold Xarelto  for 2 days prior to procedure.   Patient will not need bridging with Lovenox  (enoxaparin ) around procedure.  I also confirmed the patient resides in the state of [place] . As per [facility] Medical Board telemedicine laws, the patient must reside in the state in which the provider is licensed.   Wyn Raddle, Jackee Shove, NP 03/14/2024, 8:01 AM [facility]

## 2024-03-14 NOTE — Telephone Encounter (Signed)
 Patient with diagnosis of atrial fibrillation on Xarelto  for anticoagulation.    Procedure:  Dental Extraction - Amount of Teeth to be Pulled:  3 SURGICAL EXTRACTIONS   Date of Surgery:  Clearance TBD   CHA2DS2-VASc Score = 3   This indicates a 3.2% annual risk of stroke. The patient's score is based upon: CHF History: 1 HTN History: 1 Diabetes History: 0 Stroke History: 0 Vascular Disease History: 1 Age Score: 0 Gender Score: 0   CrCl 96 Platelet count 207  Patient has not had an Afib/aflutter ablation within the last 3 months or DCCV within the last 30 days  Patient does not require pre-op  antibiotics for dental procedure.  Per office protocol, patient can hold Xarelto  for 2 days prior to procedure.   Patient will not need bridging with Lovenox  (enoxaparin ) around procedure.  **This guidance is not considered finalized until pre-operative APP has relayed final recommendations.**

## 2024-03-20 ENCOUNTER — Other Ambulatory Visit (HOSPITAL_COMMUNITY): Payer: Self-pay | Admitting: Physician Assistant

## 2024-03-20 ENCOUNTER — Ambulatory Visit: Attending: Cardiology | Admitting: Nurse Practitioner

## 2024-03-20 ENCOUNTER — Other Ambulatory Visit: Payer: Self-pay | Admitting: Internal Medicine

## 2024-03-20 DIAGNOSIS — Z01810 Encounter for preprocedural cardiovascular examination: Secondary | ICD-10-CM

## 2024-03-20 DIAGNOSIS — J449 Chronic obstructive pulmonary disease, unspecified: Secondary | ICD-10-CM

## 2024-03-20 DIAGNOSIS — R0981 Nasal congestion: Secondary | ICD-10-CM

## 2024-03-20 DIAGNOSIS — I1A0 Resistant hypertension: Secondary | ICD-10-CM

## 2024-03-20 NOTE — Progress Notes (Signed)
 Virtual Visit via Telephone Note   Because of Jesse DURNIN [name] co-morbid illnesses, he is at least at moderate risk for complications without adequate follow up.  This format is felt to be most appropriate for this patient at this time.  Due to technical limitations with video connection Web designer), today's appointment will be conducted as an audio only telehealth visit, and Jesse SORLIE [name] verbally agreed to proceed in this manner.   All issues noted in this document were discussed and addressed.  No physical exam could be performed with this format.  Evaluation Performed:  Preoperative cardiovascular risk assessment _____________   Date:  03/20/2024   Patient ID:  Jesse JONELLE All [name], DOB [birth year], MRN [id number] Patient Location:  Home Provider location:   Office  Primary Care Provider:  Jesus Bernardino MATSU, MD Primary Cardiologist:  None  Chief Complaint / Patient Profile   63 y.o. y/o male with a h/o chronic HFmrEF, persistent atrial fibrillation s/p ablation, hypertension, hyperlipidemia, and thyroid  disease who is pending surgical dental extraction (3 teeth) with Dr. Suan Crea, DMD and presents today for telephonic preoperative cardiovascular risk assessment.  History of Present Illness    Jesse STUKEY [name] is a 63 y.o. male who presents via audio/video conferencing for a telehealth visit today.  Pt was last seen in cardiology clinic on 06/22/2023 by Dr. Nancey.  He was seen in the A-fib clinic on 12/21/2023.  At that time Jesse JONELLE All [name] was doing well.  The patient is now pending procedure as outlined above. Since his last visit, he has done well from a cardiac standpoint.   He denies chest pain, palpitations, dyspnea, pnd, orthopnea, n, v, dizziness, syncope, edema, weight gain, or early satiety. All other systems reviewed and are otherwise negative except as noted above.   Past Medical History    Past Medical History:  Diagnosis Date   Abdominal pain  03/31/2023   Epigastric and diffuse, emergency room visit did excellent evaluation. Seems to be improving with protonix  and hold colchicine .  Leucocytosis noted     Acute hypoxemic respiratory failure (HCC) 09/18/2022   Acute kidney injury (HCC) 05/09/2022   Microscopic hematuria (R31.9): Updated 04/14/23. Moderate blood in urine. Renal ultrasound and specialist referrals ordered.  AKI vs Chronic kidney disease, stage 3a (N18.31): New diagnosis 04/14/23. GFR 51.84, creatinine 1.43. Nephrology referral placed.  Benign prostatic hyperplasia (N40.1): Needs follow-up. Last seen by urology several years ago. Urology referral placed for re-evaluation.  Prot   Allergy    Anxiety    Atrial fibrillation (HCC)    Atrial fibrillation with RVR (HCC) 09/12/2022   Bell palsy    Depression    Diastolic heart failure    HAP (hospital-acquired pneumonia) 09/17/2022   Hypercholesterolemia    Hypertension    Hypokalemia 03/07/2023      moderate (2.8 - 3.5 meq/dl)  Recent started spironolactone           Lab Results      Component    Value    Date/Time           K    3.7    03/29/2023 05:26 PM           K    4.0    03/08/2023 11:04 AM           K    3.2 (L)    03/01/2023 10:09 AM           K  4.2    02/25/2023 03:37 PM           K    3.9    11/30/2022 10:29 AM           K    3.9    10/19/2022 10:03 AM           K    4.3    01   Pneumonia    Pneumonia of right lower lobe due to infectious organism 09/17/2022   Thyroid  disease    Past Surgical History:  Procedure Laterality Date   ATRIAL FIBRILLATION ABLATION N/A 03/22/2023   Procedure: ATRIAL FIBRILLATION ABLATION;  Surgeon: Nancey Eulas BRAVO, MD;  Location: [facility] INVASIVE CV LAB;  Service: Cardiovascular;  Laterality: N/A;   CARDIOVERSION N/A 09/15/2022   Procedure: CARDIOVERSION;  Surgeon: Santo Stanly LABOR, MD;  Location: [facility] ENDOSCOPY;  Service: Cardiovascular;  Laterality: N/A;   CARDIOVERSION N/A 10/22/2022   Procedure: CARDIOVERSION;  Surgeon: Mona Vinie BROCKS, MD;  Location: [facility] ENDOSCOPY;  Service: Cardiovascular;  Laterality: N/A;   CHOLECYSTECTOMY     HERNIA REPAIR     KNEE SURGERY     VASECTOMY      Allergies  Allergies  Allergen Reactions   Sulfa Drugs Cross Reactors Hives   Codeine Other (See Comments)    Made him very jittery    Hydrochlorothiazide  Other (See Comments)    Hyponatremia    Home Medications    Prior to Admission medications   Medication Sig Start Date End Date Taking? Authorizing Provider  amoxicillin -clavulanate (AUGMENTIN ) 875-125 MG tablet Take 1 tablet by mouth 2 (two) times daily. Patient not taking: Reported on 03/14/2024 10/11/23   Jesus Bernardino MATSU, MD  Cholecalciferol (VITAMIN D -3) 125 MCG (5000 UT) TABS Take 5,000 Units by mouth daily. 03/17/23   Jesus Bernardino MATSU, MD  cloNIDine  (CATAPRES  - DOSED IN MG/24 HR) 0.1 mg/24hr patch PLACE 1 PATCH (0.1 MG TOTAL) ONTO THE SKIN ONCE A WEEK. 10/12/23   Jesus Bernardino MATSU, MD  cloNIDine  (CATAPRES  - DOSED IN MG/24 HR) 0.2 mg/24hr patch 0.2 mg once a week. 06/20/23   [provider]  cyclobenzaprine  (FLEXERIL ) 10 MG tablet TAKE 1 TABLET BY MOUTH THREE TIMES A DAY AS NEEDED FOR MUSCLE SPASMS 03/12/24   Jesus Bernardino MATSU, MD  EDEX 20 MCG injection SMARTSIG:1 Milliliter(s) Intracavernosally Every Other Day PRN 09/13/23   [provider]  ezetimibe  (ZETIA ) 10 MG tablet TAKE 1 TABLET BY MOUTH EVERY DAY 03/09/24   Jesus Bernardino MATSU, MD  isosorbide  mononitrate (IMDUR ) 30 MG 24 hr tablet TAKE 1 TABLET (30 MG TOTAL) BY MOUTH DAILY. CANNOT TAKE LEVITRA  WITH. 03/09/24   Jesus Bernardino MATSU, MD  levalbuterol  (XOPENEX  HFA) 45 MCG/ACT inhaler Inhale 1 puff into the lungs 4 (four) times daily. 10/12/23   Jesus Bernardino MATSU, MD  levothyroxine  (SYNTHROID ) 200 MCG tablet Take 1 tablet (200 mcg total) by mouth daily before breakfast. SKIP SUNDAYS, patient wants synthroid  and not generic levothyroxine  01/26/24   Dartha Ernst, MD  loratadine  (CLARITIN ) 10 MG tablet Take 1 tablet (10 mg  total) by mouth daily. 02/17/23   Jesus Bernardino MATSU, MD  LORazepam  (ATIVAN ) 1 MG tablet Take 1 tablet (1 mg total) by mouth 2 (two) times daily as needed for anxiety or sleep. Patient not taking: Reported on 03/14/2024 10/11/23   Jesus Bernardino MATSU, MD  losartan  (COZAAR ) 100 MG tablet Take 1 tablet (100 mg total) by mouth daily. 03/17/23   Jesus Bernardino MATSU, MD  magic mouthwash (nystatin , lidocaine , diphenhydrAMINE , alum & mag hydroxide) suspension Swish and swallow 5 mLs 4 (four) times daily. 10/25/23   Jesus Bernardino MATSU, MD  metoprolol  tartrate (LOPRESSOR ) 50 MG tablet Take 50 mg by mouth 2 (two) times daily. 05/23/23   [provider]  montelukast  (SINGULAIR ) 10 MG tablet Take 1 tablet (10 mg total) by mouth at bedtime. TAKE 1 TABLET BY MOUTH EVERYDAY AT BEDTIME Strength: 10 mg 03/17/23   Jesus Bernardino MATSU, MD  Multiple Vitamins-Minerals (MULTIVITAMIN WITH MINERALS) tablet Take 1 tablet by mouth daily. 03/17/23   Jesus Bernardino MATSU, MD  nicotine  (NICODERM CQ  - DOSED IN MG/24 HOURS) 21 mg/24hr patch Place 21 mg onto the skin daily. 11/03/23   [provider]  rivaroxaban  (XARELTO ) 20 MG TABS tablet Take 1 tablet (20 mg total) by mouth daily with supper. 09/06/23   Mealor, Augustus E, MD  rosuvastatin  (CRESTOR ) 40 MG tablet Take 1 tablet (40 mg total) by mouth daily. Replaces 20 mg dose 10/11/23 10/05/24  Jesus Bernardino MATSU, MD  Saline (SIMPLY SALINE) 0.9 % AERS Place 2 each into the nose as directed. Use nightly for sinus hygiene long-term.  Can also be used as many times daily as desired to assist with clearing congested sinuses. Patient not taking: Reported on 03/14/2024 02/17/23   Jesus Bernardino MATSU, MD  spironolactone  (ALDACTONE ) 25 MG tablet Take 1 tablet (25 mg total) by mouth daily. 03/01/24   Jesus Bernardino MATSU, MD  tamsulosin  (FLOMAX ) 0.4 MG CAPS capsule TAKE 2 CAPSULES BY MOUTH EVERY DAY 07/05/23   Jesus Bernardino MATSU, MD  torsemide  (DEMADEX ) 10 MG tablet Take 1 tablet (10 mg total) by mouth daily. 03/11/24    Jesus Bernardino MATSU, MD  vardenafil  (LEVITRA ) 20 MG tablet TAKE 1 TABLET BY MOUTH EVERY DAY AS NEEDED FOR ERECTILE DYSFUNCTION 09/03/23   Jesus Bernardino MATSU, MD    Physical Exam    Vital Signs:  Jesse JONELLE All [name] does not have vital signs available for review today.  Given telephonic nature of communication, physical exam is limited. AAOx3. NAD. Normal affect.  Speech and respirations are unlabored.  Accessory Clinical Findings    None  Assessment & Plan    1.  Preoperative Cardiovascular Risk Assessment:  According to the Revised Cardiac Risk Index (RCRI), his Perioperative Risk of Major Cardiac Event is (%): 0.9. His Functional Capacity in METs is: 6.61 according to the Duke Activity Status Index (DASI).Therefore, based on ACC/AHA guidelines, patient would be at acceptable risk for the planned procedure without further cardiovascular testing.  The patient was advised that if he develops new symptoms prior to surgery to contact our office to arrange for a follow-up visit, and he verbalized understanding.  Patient does not require pre-op  antibiotics for dental procedure.   Per office protocol, patient can hold Xarelto  for 2 days prior to procedure.  Patient will not need bridging with Lovenox  (enoxaparin ) around procedure. Please resume Xarelto  as soon as possible postprocedure, at the discretion of the surgeon.    A copy of this note will be routed to requesting surgeon.  Time:   Today, I have spent 6 minutes with the patient with telehealth technology discussing medical history, symptoms, and management plan.     Damien JAYSON Braver, NP  03/20/2024, 3:08 PM

## 2024-03-29 ENCOUNTER — Other Ambulatory Visit: Payer: Self-pay | Admitting: Internal Medicine

## 2024-03-29 DIAGNOSIS — I1A0 Resistant hypertension: Secondary | ICD-10-CM

## 2024-03-29 DIAGNOSIS — F17200 Nicotine dependence, unspecified, uncomplicated: Secondary | ICD-10-CM

## 2024-04-13 ENCOUNTER — Other Ambulatory Visit: Payer: Self-pay | Admitting: Internal Medicine

## 2024-05-02 ENCOUNTER — Other Ambulatory Visit

## 2024-05-03 LAB — TSH+FREE T4: TSH W/REFLEX TO FT4: 0.4 m[IU]/L (ref 0.40–4.50)

## 2024-05-04 ENCOUNTER — Encounter: Payer: Self-pay | Admitting: "Endocrinology

## 2024-05-04 ENCOUNTER — Ambulatory Visit: Admitting: "Endocrinology

## 2024-05-04 VITALS — BP 104/70 | HR 84 | Ht 70.8 in | Wt 211.0 lb

## 2024-05-04 DIAGNOSIS — F17200 Nicotine dependence, unspecified, uncomplicated: Secondary | ICD-10-CM

## 2024-05-04 DIAGNOSIS — E890 Postprocedural hypothyroidism: Secondary | ICD-10-CM

## 2024-05-04 NOTE — Progress Notes (Signed)
 Outpatient Endocrinology Note Obadiah Birmingham, MD  05/04/24   [name] 16-Feb-1961 [id number]  Referring Provider: Jesus Bernardino MATSU, MD Primary Care Provider: Jesus Bernardino MATSU, MD Subjective  No chief complaint on file.   Assessment & Plan  Diagnoses and all orders for this visit:  Hypothyroidism following radioiodine therapy -     TSH + free T4  Smoker     Jesse AZER [name] is currently taking synthroid  200 mcg Mon-Sat and half a pill (100mcg) on Sunday. TSH 0.05  Patient is currently biochemically euthyroid.  Educated on thyroid  axis.  10/22/23: Recommend the following: Take synthroid  200 mcg Mon-Sat and skip on Sunday. Take levothyroxine  first thing in the morning on empty stomach and wait at least 30 minutes to 1 hour before eating or drinking anything or taking any other medications. Space out levothyroxine  by 4 hours from any acid reflux medication/fibrate/iron/calcium /multivitamin. Advised to take nutritional supplements in the evening. Repeat lab before next visit or sooner if symptoms of hyperthyroidism or hypothyroidism develop.  Notify us  immediately in case of significant weight gain or loss. Counseled on compliance and follow up needs.  The patient was counseled on the dangers of tobacco use, and was advised to quit.  Reviewed strategies to maximize success, including removing cigarettes and smoking materials from environment and written materials. Has nicotine  patches from PCP to curb nicotine  dependence    I have reviewed current medications, nurse's notes, allergies, vital signs, past medical and surgical history, family medical history, and social history for this encounter. Counseled patient on symptoms, examination findings, lab findings, imaging results, treatment decisions and monitoring and prognosis. The patient understood the recommendations and agrees with the treatment plan. All questions regarding treatment plan were fully  answered.   Return in about 4 months (around 09/03/2024) for visit + labs before next visit.   Obadiah Birmingham, MD  05/04/24   I have reviewed current medications, nurse's notes, allergies, vital signs, past medical and surgical history, family medical history, and social history for this encounter. Counseled patient on symptoms, examination findings, lab findings, imaging results, treatment decisions and monitoring and prognosis. The patient understood the recommendations and agrees with the treatment plan. All questions regarding treatment plan were fully answered.   History of Present Illness Jesse BUNN [name] is a 63 y.o. year old male who presents to our clinic with post-ablative hypothyroidism.    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   Symptoms suggestive of HYPOTHYROIDISM:  fatigue Yes weight gain Yes cold intolerance  No constipation  No  Symptoms suggestive of HYPERTHYROIDISM:  weight loss  No heat intolerance No hyperdefecation  No palpitations  No  Compressive symptoms:  dysphagia  No dysphonia  No positional dyspnea (especially with simultaneous arms elevation)  No  Smokes  Yes On biotin  No Personal history of head/neck surgery/irradiation  Yes   Physical Exam  BP 104/70   Pulse 84   Ht 5' 10.8 (1.798 m)   Wt 211 lb (95.7 kg)   SpO2 96%   BMI 29.60 kg/m  Constitutional: well developed, well nourished Head: normocephalic, atraumatic, no exophthalmos Eyes: sclera anicteric, no redness Neck: no thyromegaly, no thyroid  tenderness; no nodules palpated Lungs: normal respiratory effort Neurology: alert and oriented, no fine hand tremor Skin: dry, no appreciable rashes Musculoskeletal: no appreciable defects Psychiatric: normal mood and affect  Allergies Allergies  Allergen Reactions   Sulfa Drugs Cross Reactors Hives   Codeine Other (See Comments)  Made him very jittery    Hydrochlorothiazide  Other (See Comments)     Hyponatremia    Current Medications Patient's Medications  New Prescriptions   No medications on file  Previous Medications   ALBUTEROL  (VENTOLIN  HFA) 108 (90 BASE) MCG/ACT INHALER    TAKE 2 PUFFS BY MOUTH EVERY 6 HOURS AS NEEDED FOR WHEEZE OR SHORTNESS OF BREATH   AMOXICILLIN -CLAVULANATE (AUGMENTIN ) 875-125 MG TABLET    Take 1 tablet by mouth 2 (two) times daily.   CHOLECALCIFEROL (VITAMIN D -3) 125 MCG (5000 UT) TABS    Take 5,000 Units by mouth daily.   CLONIDINE  (CATAPRES  - DOSED IN MG/24 HR) 0.1 MG/24HR PATCH    PLACE 1 PATCH (0.1 MG TOTAL) ONTO THE SKIN ONCE A WEEK.   CLONIDINE  (CATAPRES  - DOSED IN MG/24 HR) 0.2 MG/24HR PATCH    0.2 mg once a week.   CYCLOBENZAPRINE  (FLEXERIL ) 10 MG TABLET    TAKE 1 TABLET BY MOUTH THREE TIMES A DAY AS NEEDED FOR MUSCLE SPASMS   EDEX 20 MCG INJECTION    SMARTSIG:1 Milliliter(s) Intracavernosally Every Other Day PRN   EZETIMIBE  (ZETIA ) 10 MG TABLET    TAKE 1 TABLET BY MOUTH EVERY DAY   HYDROXYZINE  (ATARAX ) 25 MG TABLET    TAKE 1 TABLET BY MOUTH THREE TIMES A DAY AS NEEDED   ISOSORBIDE  MONONITRATE (IMDUR ) 30 MG 24 HR TABLET    TAKE 1 TABLET (30 MG TOTAL) BY MOUTH DAILY. CANNOT TAKE LEVITRA  WITH.   LEVALBUTEROL  (XOPENEX  HFA) 45 MCG/ACT INHALER    Inhale 1 puff into the lungs 4 (four) times daily.   LEVOTHYROXINE  (SYNTHROID ) 200 MCG TABLET    Take 1 tablet (200 mcg total) by mouth daily before breakfast. SKIP SUNDAYS, patient wants synthroid  and not generic levothyroxine    LORATADINE  (CLARITIN ) 10 MG TABLET    Take 1 tablet (10 mg total) by mouth daily.   LORAZEPAM  (ATIVAN ) 1 MG TABLET    Take 1 tablet (1 mg total) by mouth 2 (two) times daily as needed for anxiety or sleep.   LOSARTAN  (COZAAR ) 100 MG TABLET    TAKE 1 TABLET BY MOUTH EVERY DAY   MAGIC MOUTHWASH (NYSTATIN , LIDOCAINE , DIPHENHYDRAMINE , ALUM & MAG HYDROXIDE) SUSPENSION    Swish and swallow 5 mLs 4 (four) times daily.   METOPROLOL  TARTRATE (LOPRESSOR ) 50 MG TABLET    TAKE 1 TABLET BY MOUTH TWICE  A DAY   MONTELUKAST  (SINGULAIR ) 10 MG TABLET    TAKE 1 TABLET BY MOUTH AT BEDTIME. TAKE 1 TABLET BY MOUTH EVERYDAY AT BEDTIME STRENGTH: 10 MG   MULTIPLE VITAMINS-MINERALS (MULTIVITAMIN WITH MINERALS) TABLET    Take 1 tablet by mouth daily.   NICOTINE  (NICODERM CQ  - DOSED IN MG/24 HOURS) 21 MG/24HR PATCH    PLACE 1 PATCH ONTO THE SKIN DAILY.   RIVAROXABAN  (XARELTO ) 20 MG TABS TABLET    Take 1 tablet (20 mg total) by mouth daily with supper.   ROSUVASTATIN  (CRESTOR ) 40 MG TABLET    Take 1 tablet (40 mg total) by mouth daily. Replaces 20 mg dose   SALINE (SIMPLY SALINE) 0.9 % AERS    Place 2 each into the nose as directed. Use nightly for sinus hygiene long-term.  Can also be used as many times daily as desired to assist with clearing congested sinuses.   SPIRONOLACTONE  (ALDACTONE ) 25 MG TABLET    Take 1 tablet (25 mg total) by mouth daily.   TAMSULOSIN  (FLOMAX ) 0.4 MG CAPS CAPSULE    TAKE 2 CAPSULES  BY MOUTH EVERY DAY   TORSEMIDE  (DEMADEX ) 10 MG TABLET    Take 1 tablet (10 mg total) by mouth daily.   VARDENAFIL  (LEVITRA ) 20 MG TABLET    TAKE 1 TABLET BY MOUTH EVERY DAY AS NEEDED FOR ERECTILE DYSFUNCTION  Modified Medications   No medications on file  Discontinued Medications   No medications on file    Past Medical History Past Medical History:  Diagnosis Date   Abdominal pain 03/31/2023   Epigastric and diffuse, emergency room visit did excellent evaluation. Seems to be improving with protonix  and hold colchicine .  Leucocytosis noted     Acute hypoxemic respiratory failure (HCC) 09/18/2022   Acute kidney injury (HCC) 05/09/2022   Microscopic hematuria (R31.9): Updated 04/14/23. Moderate blood in urine. Renal ultrasound and specialist referrals ordered.  AKI vs Chronic kidney disease, stage 3a (N18.31): New diagnosis 04/14/23. GFR 51.84, creatinine 1.43. Nephrology referral placed.  Benign prostatic hyperplasia (N40.1): Needs follow-up. Last seen by urology several years ago. Urology referral  placed for re-evaluation.  Prot   Allergy    Anxiety    Atrial fibrillation (HCC)    Atrial fibrillation with RVR (HCC) 09/12/2022   Bell palsy    Depression    Diastolic heart failure    HAP (hospital-acquired pneumonia) 09/17/2022   Hypercholesterolemia    Hypertension    Hypokalemia 03/07/2023      moderate (2.8 - 3.5 meq/dl)  Recent started spironolactone           Lab Results      Component    Value    Date/Time           K    3.7    03/29/2023 05:26 PM           K    4.0    03/08/2023 11:04 AM           K    3.2 (L)    03/01/2023 10:09 AM           K    4.2    02/25/2023 03:37 PM           K    3.9    11/30/2022 10:29 AM           K    3.9    10/19/2022 10:03 AM           K    4.3    01   Pneumonia    Pneumonia of right lower lobe due to infectious organism 09/17/2022   Thyroid  disease     Past Surgical History Past Surgical History:  Procedure Laterality Date   ATRIAL FIBRILLATION ABLATION N/A 03/22/2023   Procedure: ATRIAL FIBRILLATION ABLATION;  Surgeon: Nancey Eulas BRAVO, MD;  Location: [facility];  Service: Cardiovascular;  Laterality: N/A;   CARDIOVERSION N/A 09/15/2022   Procedure: CARDIOVERSION;  Surgeon: Santo Stanly LABOR, MD;  Location: [facility];  Service: Cardiovascular;  Laterality: N/A;   CARDIOVERSION N/A 10/22/2022   Procedure: CARDIOVERSION;  Surgeon: Mona Vinie BROCKS, MD;  Location: [facility];  Service: Cardiovascular;  Laterality: N/A;   CHOLECYSTECTOMY     HERNIA REPAIR     KNEE SURGERY     VASECTOMY      Family History family history includes Alcohol abuse in his father; Cirrhosis in his father; Heart attack in his mother; Heart disease in his mother; Hypertension in his father; Kidney cancer in his mother; Mental illness in his brother; Other in his father  and mother; Parkinsonism in his brother.  Social History Social History   Socioeconomic History   Marital status: Married    Spouse name: Not on file   Number of children: 1    Years of education: Not on file   Highest education level: Not on file  Occupational History   Not on file  Tobacco Use   Smoking status: Every Day    Current packs/day: 0.50    Average packs/day: 0.5 packs/day for 45.6 years (22.8 ttl pk-yrs)    Types: Cigarettes    Start date: 09/14/1978   Smokeless tobacco: Never  Vaping Use   Vaping status: Never Used  Substance and Sexual Activity   Alcohol use: Yes    Alcohol/week: 0.0 standard drinks of alcohol    Comment: rarely   Drug use: No   Sexual activity: Never    Birth control/protection: None  Other Topics Concern   Not on file  Social History Narrative   Marital status: married x 30+ years      Children:  2 children;(38,37); 3+3 grandchild      Lives:  With wife, son      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      Tobacco: 1 ppd x 30 years      Alcohol: rare     Regular exercise: walking daily   Caffeine use: daily      Social Drivers of Corporate investment banker Strain: Not on file  Food Insecurity: No Food Insecurity (09/13/2022)   Hunger Vital Sign    Worried About Running Out of Food in the Last Year: Never true    Ran Out of Food in the Last Year: Never true  Transportation Needs: No Transportation Needs (09/13/2022)   PRAPARE - Administrator, Civil Service (Medical): No    Lack of Transportation (Non-Medical): No  Physical Activity: Not on file  Stress: Not on file  Social Connections: Unknown (01/27/2022)   Received from [facility]   Social Network    Social Network: Not on file  Intimate Partner Violence: Not At Risk (09/13/2022)   Humiliation, Afraid, Rape, and Kick questionnaire    Fear of Current or Ex-Partner: No    Emotionally Abused: No    Physically Abused: No    Sexually Abused: No    Laboratory Investigations Lab Results  Component Value Date   TSH 0.69 06/18/2023   TSH 8.05 (H) 03/01/2023   TSH 0.22 (L) 11/30/2022   FREET4 2.3 (H) 10/11/2023   FREET4  1.12 03/01/2023   FREET4 1.52 11/30/2022     No results found for: TSI   No components found for: TRAB   Lab Results  Component Value Date   CHOL 206 (H) 10/11/2023   Lab Results  Component Value Date   HDL 42.40 10/11/2023   Lab Results  Component Value Date   LDLCALC 116 (H) 10/11/2023   Lab Results  Component Value Date   TRIG 239.0 (H) 10/11/2023   Lab Results  Component Value Date   CHOLHDL 5 10/11/2023   Lab Results  Component Value Date   CREATININE 1.00 10/11/2023   Lab Results  Component Value Date   GFR 79.36 10/11/2023      Component Value Date/Time   NA 135 10/11/2023 1417   NA 141 03/08/2023 1104   K 3.9 10/11/2023 1417   CL 98 10/11/2023 1417   CO2 30 10/11/2023 1417   GLUCOSE 114 (H) 10/11/2023  1417   BUN 12 10/11/2023 1417   BUN 12 03/08/2023 1104   CREATININE 1.00 10/11/2023 1417   CREATININE 0.98 01/06/2017 1617   CALCIUM  9.1 10/11/2023 1417   PROT 7.2 10/11/2023 1417   PROT 6.9 10/11/2023 1417   PROT 6.6 10/19/2022 1003   ALBUMIN 4.4 10/11/2023 1417   ALBUMIN 4.3 10/19/2022 1003   AST 18 10/11/2023 1417   ALT 16 10/11/2023 1417   ALKPHOS 96 10/11/2023 1417   BILITOT 0.4 10/11/2023 1417   BILITOT 0.5 10/19/2022 1003   GFRNONAA >60 03/29/2023 1726   GFRNONAA >89 05/22/2016 1308   GFRAA >60 09/30/2019 1937   GFRAA >89 05/22/2016 1308      Latest Ref Rng & Units 10/11/2023    2:17 PM 04/14/2023    2:56 PM 03/29/2023    5:26 PM  BMP  Glucose 70 - 99 mg/dL 885  89  90   BUN 6 - 23 mg/dL 12  12  12    Creatinine 0.40 - 1.50 mg/dL 8.99  8.56  8.94   Sodium 135 - 145 mEq/L 135  140  137   Potassium 3.5 - 5.1 mEq/L 3.9  4.3  3.7   Chloride 96 - 112 mEq/L 98  103  103   CO2 19 - 32 mEq/L 30  31  27    Calcium  8.4 - 10.5 mg/dL 9.1  9.0  9.0        Component Value Date/Time   WBC 17.5 (H) 10/11/2023 1417   RBC 5.36 10/11/2023 1417   HGB 16.1 10/11/2023 1417   HGB 16.9 03/08/2023 1104   HCT 47.9 10/11/2023 1417   HCT 53.8 (H)  03/08/2023 1104   PLT 207.0 10/11/2023 1417   PLT 209 03/08/2023 1104   MCV 89.4 10/11/2023 1417   MCV 92 03/08/2023 1104   MCH 29.9 03/29/2023 1726   MCHC 33.6 10/11/2023 1417   RDW 14.0 10/11/2023 1417   RDW 14.4 03/08/2023 1104   LYMPHSABS 2.5 10/11/2023 1417   MONOABS 1.3 (H) 10/11/2023 1417   EOSABS 0.2 10/11/2023 1417   BASOSABS 0.0 10/11/2023 1417      Parts of this note may have been dictated using voice recognition software. There may be variances in spelling and vocabulary which are unintentional. Not all errors are proofread. Please notify the dino if any discrepancies are noted or if the meaning of any statement is not clear.

## 2024-05-19 ENCOUNTER — Other Ambulatory Visit: Payer: Self-pay | Admitting: Internal Medicine

## 2024-05-19 DIAGNOSIS — J449 Chronic obstructive pulmonary disease, unspecified: Secondary | ICD-10-CM

## 2024-05-22 ENCOUNTER — Other Ambulatory Visit (HOSPITAL_COMMUNITY): Payer: Self-pay

## 2024-05-22 ENCOUNTER — Telehealth: Payer: Self-pay

## 2024-05-22 NOTE — Telephone Encounter (Signed)
 Pharmacy Patient Advocate Encounter  Received notification from [employer] that Prior Authorization for XOPENEX  HFA has been APPROVED from 09/15/23 to 09/13/2024   PA #/Case ID/Reference #: [id number]

## 2024-05-22 NOTE — Telephone Encounter (Signed)
 Pharmacy Patient Advocate Encounter   Received notification from RX Request Messages that prior authorization for XOPENEX  HFA is required/requested.   Insurance verification completed.   The patient is insured through [place] .   Per test claim: PA required; PA submitted to above mentioned insurance via Latent Key/confirmation #/EOC [id number] Status is pending

## 2024-06-03 NOTE — Progress Notes (Unsigned)
  Electrophysiology Office Note:   Date:  06/05/2024  ID:  Jesse SCHWAGER [name], DOB [birth year], MRN [id number]  Primary Cardiologist: None Primary Heart Failure: None Electrophysiologist: Eulas FORBES Furbish, MD      History of Present Illness:   Jesse JONELLE All [name] is a 63 y.o. male with h/o AF, HFmrEF, HTN, HLD, thyrotoxicosis seen today for routine electrophysiology followup.   Since last being seen in our clinic the patient reports doing fairly well. He lost his wife in August and this has been a tough time for him.  In regards to his AF, he has not had any new burden.  Feels his rhythm is largely under control. He is aware when he goes into AF but does not have a way to monitor.  He asks how long he will have to take a blood thinner. No bleeding events.  He denies chest pain, palpitations, dyspnea, PND, orthopnea, nausea, vomiting, dizziness, syncope, edema, weight gain, or early satiety.   Review of systems complete and found to be negative unless listed in HPI.   EP Information / Studies Reviewed:    EKG is ordered today. Personal review as below.  EKG Interpretation Date/Time:  Monday June 05 2024 10:30:42 EDT Ventricular Rate:  67 PR Interval:  136 QRS Duration:  88 QT Interval:  398 QTC Calculation: 420 R Axis:   88  Text Interpretation: Normal sinus rhythm Confirmed by Aniceto Jarvis ([id number]) on 06/05/2024 10:34:34 AM    Arrhythmia / AAD / Pertinent EP Studies AF > initial dx in setting of thyrotoxicosis   DCCV > 09/15/22, 10/22/22  Amiodarone  08/2022 > 06/2023 (considered amio failure) EPS 03/22/23 > PVI ablation    Risk Assessment/Calculations:    CHA2DS2-VASc Score = 3   This indicates a 3.2% annual risk of stroke. The patient's score is based upon: CHF History: 1 HTN History: 1 Diabetes History: 0 Stroke History: 0 Vascular Disease History: 1 Age Score: 0 Gender Score: 0             Physical Exam:   VS:  BP 104/70   Pulse 67   Ht 5' 10 (1.778 m)    Wt 212 lb 12.8 oz (96.5 kg)   SpO2 94%   BMI 30.53 kg/m    Wt Readings from Last 3 Encounters:  06/05/24 212 lb 12.8 oz (96.5 kg)  05/04/24 211 lb (95.7 kg)  01/26/24 203 lb (92.1 kg)     GEN: Well nourished, well developed in no acute distress NECK: No JVD; No carotid bruits CARDIAC: Regular rate and rhythm, no murmurs, rubs, gallops RESPIRATORY:  Clear to auscultation without rales, wheezing or rhonchi  ABDOMEN: Soft, non-tender, non-distended EXTREMITIES:  No edema; No deformity   ASSESSMENT AND PLAN:    Persistent Atrial Fibrillation CHA2DS2-VASc at least 3, failed amio, s/p PVI ablation 03/22/23  -OAC for stroke prophylaxis  -metoprolol  50 mg BID -EKG with NSR  -no symptom burden   -encouraged K[employer]ia Mobile for rhythm monitoring   Secondary Hypercoagulable State  -continue Xarelto   -Watchman briefly discussed after pt asked how long he would need to be on OAC.  He is comfortable with medication at this time.   Hypertension  -well controlled on current regimen   HFmeEF  -euvolemic on exam      Follow up with Dr. Furbish / EP APP in 12 months  Signed, Jarvis Aniceto, NP-C, AGACNP-BC [facility] - Electrophysiology  06/05/2024, 10:44 AM

## 2024-06-05 ENCOUNTER — Ambulatory Visit: Payer: Self-pay | Attending: Pulmonary Disease | Admitting: Pulmonary Disease

## 2024-06-05 ENCOUNTER — Encounter: Payer: Self-pay | Admitting: Pulmonary Disease

## 2024-06-05 VITALS — BP 104/70 | HR 67 | Ht 70.0 in | Wt 212.8 lb

## 2024-06-05 DIAGNOSIS — I482 Chronic atrial fibrillation, unspecified: Secondary | ICD-10-CM

## 2024-06-05 DIAGNOSIS — D6869 Other thrombophilia: Secondary | ICD-10-CM | POA: Diagnosis not present

## 2024-06-05 DIAGNOSIS — I480 Paroxysmal atrial fibrillation: Secondary | ICD-10-CM | POA: Diagnosis not present

## 2024-06-05 DIAGNOSIS — I10 Essential (primary) hypertension: Secondary | ICD-10-CM

## 2024-06-05 DIAGNOSIS — I4819 Other persistent atrial fibrillation: Secondary | ICD-10-CM

## 2024-06-05 MED ORDER — RIVAROXABAN 20 MG PO TABS: 20.0000 mg | ORAL_TABLET | Freq: Every day | ORAL | 3 refills | Status: AC

## 2024-06-05 NOTE — Patient Instructions (Signed)
 Medication Instructions:  Your physician recommends that you continue on your current medications as directed. Please refer to the Current Medication list given to you today.  *If you need a refill on your cardiac medications before your next appointment, please call your pharmacy*  Lab Work: None ordered If you have labs (blood work) drawn today and your tests are completely normal, you will receive your results only by: MyChart Message (if you have MyChart) OR A paper copy in the mail If you have any lab test that is abnormal or we need to change your treatment, we will call you to review the results.  Follow-Up: At [facility], you and your health needs are our priority.  As part of our continuing mission to provide you with exceptional heart care, our providers are all part of one team.  This team includes your primary Cardiologist (physician) and Advanced Practice Providers or APPs (Physician Assistants and Nurse Practitioners) who all work together to provide you with the care you need, when you need it.  Your next appointment:   1 year(s)  Provider:   Eulas Furbish, MD or Daphne Barrack, NP     Other Instructions [employer]  FDA-cleared EKG at your fingertips. - [employer]   KardiaMobile - [employer]. [email or web address].[email or web address]   FDA-cleared, clinical grade mobile EKG monitor: Crist is the most clinically-validated mobile EKG used by the world's leading cardiac care medical professionals.  This may be useful in monitoring palpitations.  We do not have access to have them emailed and reviewed but will be glad to review while in the office.

## 2024-06-16 ENCOUNTER — Other Ambulatory Visit: Payer: Self-pay | Admitting: Internal Medicine

## 2024-06-16 DIAGNOSIS — N319 Neuromuscular dysfunction of bladder, unspecified: Secondary | ICD-10-CM

## 2024-07-29 ENCOUNTER — Other Ambulatory Visit: Payer: Self-pay | Admitting: Internal Medicine

## 2024-07-29 DIAGNOSIS — M51369 Other intervertebral disc degeneration, lumbar region without mention of lumbar back pain or lower extremity pain: Secondary | ICD-10-CM

## 2024-08-21 ENCOUNTER — Other Ambulatory Visit

## 2024-08-22 LAB — TSH+FREE T4: TSH W/REFLEX TO FT4: 0.01 m[IU]/L — ABNORMAL LOW (ref 0.40–4.50)

## 2024-08-22 LAB — T4, FREE: Free T4: 1.7 ng/dL (ref 0.8–1.8)

## 2024-08-28 ENCOUNTER — Ambulatory Visit: Admitting: "Endocrinology

## 2024-08-30 ENCOUNTER — Encounter: Payer: Self-pay | Admitting: "Endocrinology

## 2024-08-30 ENCOUNTER — Ambulatory Visit: Admitting: "Endocrinology

## 2024-08-30 VITALS — BP 120/70 | HR 60 | Ht 70.0 in | Wt 214.0 lb

## 2024-08-30 DIAGNOSIS — F17200 Nicotine dependence, unspecified, uncomplicated: Secondary | ICD-10-CM | POA: Diagnosis not present

## 2024-08-30 DIAGNOSIS — E890 Postprocedural hypothyroidism: Secondary | ICD-10-CM

## 2024-08-30 MED ORDER — LEVOTHYROXINE SODIUM 200 MCG PO TABS: 200.0000 ug | ORAL_TABLET | Freq: Every day | ORAL | 1 refills | Status: AC

## 2024-08-30 NOTE — Addendum Note (Signed)
 Addended by: Irlene Crudup on: 08/30/2024 02:46 PM   Modules accepted: Orders

## 2024-08-30 NOTE — Patient Instructions (Signed)
 Recommend the following: Take synthroid  200 mcg Mon-Fri and half a pill on Sat and SKIP ON Sunday.

## 2024-08-30 NOTE — Progress Notes (Signed)
 Outpatient Endocrinology Note Jesse Birmingham, MD  08/30/2024   Jesse COPPOLA [name] [birth year] [id number]  Referring Provider: Jesus Bernardino MATSU, MD Primary Care Provider: Jesus Bernardino MATSU, MD Subjective  No chief complaint on file.   Assessment & Plan  Diagnoses and all orders for this visit:  Hypothyroidism following radioiodine therapy  Smoker  Other orders -     levothyroxine  (SYNTHROID ) 200 MCG tablet; Take 1 tablet (200 mcg total) by mouth daily before breakfast. BRAND SYNTHROID  ONLY: Take synthroid  200 mcg Mon-Fri and half a pill on Sat and SKIP ON Sunday.    Jesse TEALE [name] is currently taking synthroid  200 mcg Mon-Sat and skip on Sunday. Patient is currently biochemically euthyroid.  Educated on thyroid  axis.  08/30/2024: Recommend the following: Take synthroid  200 mcg Mon-Fri and half a pill on Sat and SKIP ON Sunday. Take levothyroxine  first thing in the morning on empty stomach and wait at least 30 minutes to 1 hour before eating or drinking anything or taking any other medications. Space out levothyroxine  by 4 hours from any acid reflux medication/fibrate/iron/calcium /multivitamin. Advised to take nutritional supplements in the evening. Repeat lab before next visit or sooner if symptoms of hyperthyroidism or hypothyroidism develop.  Notify us  immediately in case of significant weight gain or loss. Counseled on compliance and follow up needs.  The patient was counseled on the dangers of tobacco use, and was advised to quit.  Reviewed strategies to maximize success, including removing cigarettes and smoking materials from environment and written materials. Has nicotine  patches from PCP to curb nicotine  dependence    I have reviewed current medications, nurse's notes, allergies, vital signs, past medical and surgical history, family medical history, and social history for this encounter. Counseled patient on symptoms, examination findings, lab findings,  imaging results, treatment decisions and monitoring and prognosis. The patient understood the recommendations and agrees with the treatment plan. All questions regarding treatment plan were fully answered.   No follow-ups on file.   Jesse Birmingham, MD  08/30/2024   I have reviewed current medications, nurse's notes, allergies, vital signs, past medical and surgical history, family medical history, and social history for this encounter. Counseled patient on symptoms, examination findings, lab findings, imaging results, treatment decisions and monitoring and prognosis. The patient understood the recommendations and agrees with the treatment plan. All questions regarding treatment plan were fully answered.   History of Present Illness Jesse ACKERMAN [name] is a 63 y.o. year old male who presents to our clinic with post-ablative hypothyroidism.    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   Symptoms suggestive of HYPOTHYROIDISM:  fatigue Yes weight gain Yes cold intolerance  No constipation  No  Symptoms suggestive of HYPERTHYROIDISM:  weight loss  No heat intolerance No hyperdefecation  No palpitations  No  Compressive symptoms:  dysphagia  No dysphonia  No positional dyspnea (especially with simultaneous arms elevation)  No  Smokes  Yes On biotin  No Personal history of head/neck surgery/irradiation  Yes   Physical Exam  BP 120/70   Pulse 60   Ht 5' 10 (1.778 m)   Wt 214 lb (97.1 kg)   SpO2 96%   BMI 30.71 kg/m  Constitutional: well developed, well nourished Head: normocephalic, atraumatic, no exophthalmos Eyes: sclera anicteric, no redness Neck: no thyromegaly, no thyroid  tenderness; no nodules palpated Lungs: normal respiratory effort Neurology: alert and oriented, no fine hand tremor Skin: dry, no appreciable rashes Musculoskeletal: no appreciable defects Psychiatric: normal  mood and affect  Allergies Allergies  Allergen Reactions    Sulfa Drugs Cross Reactors Hives   Codeine Other (See Comments)    Made him very jittery    Hydrochlorothiazide  Other (See Comments)    Hyponatremia    Current Medications Patient's Medications  New Prescriptions   LEVOTHYROXINE  (SYNTHROID ) 200 MCG TABLET    Take 1 tablet (200 mcg total) by mouth daily before breakfast. BRAND SYNTHROID  ONLY: Take synthroid  200 mcg Mon-Fri and half a pill on Sat and SKIP ON Sunday.  Previous Medications   ALBUTEROL  (VENTOLIN  HFA) 108 (90 BASE) MCG/ACT INHALER    TAKE 2 PUFFS BY MOUTH EVERY 6 HOURS AS NEEDED FOR WHEEZE OR SHORTNESS OF BREATH   AMOXICILLIN -CLAVULANATE (AUGMENTIN ) 875-125 MG TABLET    Take 1 tablet by mouth 2 (two) times daily.   CHOLECALCIFEROL (VITAMIN D -3) 125 MCG (5000 UT) TABS    Take 5,000 Units by mouth daily.   CLONIDINE  (CATAPRES  - DOSED IN MG/24 HR) 0.2 MG/24HR PATCH    0.2 mg once a week.   CYCLOBENZAPRINE  (FLEXERIL ) 10 MG TABLET    TAKE 1 TABLET BY MOUTH THREE TIMES A DAY AS NEEDED FOR MUSCLE SPASMS   EDEX 20 MCG INJECTION    SMARTSIG:1 Milliliter(s) Intracavernosally Every Other Day PRN   EZETIMIBE  (ZETIA ) 10 MG TABLET    TAKE 1 TABLET BY MOUTH EVERY DAY   HYDROXYZINE  (ATARAX ) 25 MG TABLET    TAKE 1 TABLET BY MOUTH THREE TIMES A DAY AS NEEDED   ISOSORBIDE  MONONITRATE (IMDUR ) 30 MG 24 HR TABLET    TAKE 1 TABLET (30 MG TOTAL) BY MOUTH DAILY. CANNOT TAKE LEVITRA  WITH.   LEVOTHYROXINE  (SYNTHROID ) 200 MCG TABLET    Take 1 tablet (200 mcg total) by mouth daily before breakfast. SKIP SUNDAYS, patient wants synthroid  and not generic levothyroxine    LORATADINE  (CLARITIN ) 10 MG TABLET    Take 1 tablet (10 mg total) by mouth daily.   LOSARTAN  (COZAAR ) 100 MG TABLET    TAKE 1 TABLET BY MOUTH EVERY DAY   METOPROLOL  TARTRATE (LOPRESSOR ) 50 MG TABLET    TAKE 1 TABLET BY MOUTH TWICE A DAY   MULTIPLE VITAMINS-MINERALS (MULTIVITAMIN WITH MINERALS) TABLET    Take 1 tablet by mouth daily.   NICOTINE  (NICODERM CQ  - DOSED IN MG/24 HOURS) 21 MG/24HR  PATCH    PLACE 1 PATCH ONTO THE SKIN DAILY.   RIVAROXABAN  (XARELTO ) 20 MG TABS TABLET    Take 1 tablet (20 mg total) by mouth daily with supper.   ROSUVASTATIN  (CRESTOR ) 40 MG TABLET    Take 1 tablet (40 mg total) by mouth daily. Replaces 20 mg dose   SPIRONOLACTONE  (ALDACTONE ) 25 MG TABLET    Take 1 tablet (25 mg total) by mouth daily.   TAMSULOSIN  (FLOMAX ) 0.4 MG CAPS CAPSULE    TAKE 2 CAPSULES BY MOUTH EVERY DAY   TORSEMIDE  (DEMADEX ) 10 MG TABLET    Take 1 tablet (10 mg total) by mouth daily.  Modified Medications   No medications on file  Discontinued Medications   No medications on file    Past Medical History Past Medical History:  Diagnosis Date   Abdominal pain 03/31/2023   Epigastric and diffuse, emergency room visit did excellent evaluation. Seems to be improving with protonix  and hold colchicine .  Leucocytosis noted     Acute hypoxemic respiratory failure (HCC) 09/18/2022   Acute kidney injury 05/09/2022   Microscopic hematuria (R31.9): Updated 04/14/23. Moderate blood in urine. Renal ultrasound and specialist  referrals ordered.  AKI vs Chronic kidney disease, stage 3a (N18.31): New diagnosis 04/14/23. GFR 51.84, creatinine 1.43. Nephrology referral placed.  Benign prostatic hyperplasia (N40.1): Needs follow-up. Last seen by urology several years ago. Urology referral placed for re-evaluation.  Prot   Allergy    Anxiety    Atrial fibrillation (HCC)    Atrial fibrillation with RVR (HCC) 09/12/2022   Bell palsy    Depression    Diastolic heart failure    HAP (hospital-acquired pneumonia) 09/17/2022   Hypercholesterolemia    Hypertension    Hypokalemia 03/07/2023      moderate (2.8 - 3.5 meq/dl)  Recent started spironolactone           Lab Results      Component    Value    Date/Time           K    3.7    03/29/2023 05:26 PM           K    4.0    03/08/2023 11:04 AM           K    3.2 (L)    03/01/2023 10:09 AM           K    4.2    02/25/2023 03:37 PM           K    3.9     11/30/2022 10:29 AM           K    3.9    10/19/2022 10:03 AM           K    4.3    01   Pneumonia    Pneumonia of right lower lobe due to infectious organism 09/17/2022   Thyroid  disease     Past Surgical History Past Surgical History:  Procedure Laterality Date   ATRIAL FIBRILLATION ABLATION N/A 03/22/2023   Procedure: ATRIAL FIBRILLATION ABLATION;  Surgeon: Nancey Eulas BRAVO, MD;  Location: [facility];  Service: Cardiovascular;  Laterality: N/A;   CARDIOVERSION N/A 09/15/2022   Procedure: CARDIOVERSION;  Surgeon: Santo Stanly LABOR, MD;  Location: [facility];  Service: Cardiovascular;  Laterality: N/A;   CARDIOVERSION N/A 10/22/2022   Procedure: CARDIOVERSION;  Surgeon: Mona Vinie BROCKS, MD;  Location: [facility];  Service: Cardiovascular;  Laterality: N/A;   CHOLECYSTECTOMY     HERNIA REPAIR     KNEE SURGERY     VASECTOMY      Family History family history includes Alcohol abuse in his father; Cirrhosis in his father; Heart attack in his mother; Heart disease in his mother; Hypertension in his father; Kidney cancer in his mother; Mental illness in his brother; Other in his father and mother; Parkinsonism in his brother.  Social History Social History   Socioeconomic History   Marital status: Married    Spouse name: Not on file   Number of children: 1   Years of education: Not on file   Highest education level: Not on file  Occupational History   Not on file  Tobacco Use   Smoking status: Every Day    Current packs/day: 0.50    Average packs/day: 0.5 packs/day for 46.0 years (23.0 ttl pk-yrs)    Types: Cigarettes    Start date: 09/14/1978   Smokeless tobacco: Never  Vaping Use   Vaping status: Never Used  Substance and Sexual Activity   Alcohol use: Yes    Alcohol/week: 0.0 standard drinks of alcohol    Comment: rarely  Drug use: No   Sexual activity: Never    Birth control/protection: None  Other Topics Concern   Not on file  Social History  Narrative   Marital status: married x 30+ years      Children:  2 children;(38,37); 3+3 grandchild      Lives:  With wife, son      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      Tobacco: 1 ppd x 30 years      Alcohol: rare     Regular exercise: walking daily   Caffeine use: daily      Social Drivers of Health   Tobacco Use: High Risk (08/30/2024)   Patient History    Smoking Tobacco Use: Every Day    Smokeless Tobacco Use: Never    Passive Exposure: Not on file  Financial Resource Strain: Not on file  Food Insecurity: No Food Insecurity (09/13/2022)   Hunger Vital Sign    Worried About Running Out of Food in the Last Year: Never true    Ran Out of Food in the Last Year: Never true  Transportation Needs: No Transportation Needs (09/13/2022)   PRAPARE - Administrator, Civil Service (Medical): No    Lack of Transportation (Non-Medical): No  Physical Activity: Not on file  Stress: Not on file  Social Connections: Unknown (01/27/2022)   Received from [facility]   Social Network    Social Network: Not on file  Intimate Partner Violence: Not At Risk (09/13/2022)   Humiliation, Afraid, Rape, and Kick questionnaire    Fear of Current or Ex-Partner: No    Emotionally Abused: No    Physically Abused: No    Sexually Abused: No  Depression (PHQ2-9): Low Risk (10/25/2023)   Depression (PHQ2-9)    PHQ-2 Score: 0  Alcohol Screen: Not on file  Housing: Low Risk (09/13/2022)   Housing    Last Housing Risk Score: 0  Utilities: Not At Risk (09/13/2022)   AHC Utilities    Threatened with loss of utilities: No  Health Literacy: Not on file    Laboratory Investigations Lab Results  Component Value Date   TSH 0.69 06/18/2023   TSH 8.05 (H) 03/01/2023   TSH 0.22 (L) 11/30/2022   FREET4 1.7 08/21/2024   FREET4 2.3 (H) 10/11/2023   FREET4 1.12 03/01/2023     No results found for: TSI   No components found for: TRAB   Lab Results  Component Value  Date   CHOL 206 (H) 10/11/2023   Lab Results  Component Value Date   HDL 42.40 10/11/2023   Lab Results  Component Value Date   LDLCALC 116 (H) 10/11/2023   Lab Results  Component Value Date   TRIG 239.0 (H) 10/11/2023   Lab Results  Component Value Date   CHOLHDL 5 10/11/2023   Lab Results  Component Value Date   CREATININE 1.00 10/11/2023   Lab Results  Component Value Date   GFR 79.36 10/11/2023      Component Value Date/Time   NA 135 10/11/2023 1417   NA 141 03/08/2023 1104   K 3.9 10/11/2023 1417   CL 98 10/11/2023 1417   CO2 30 10/11/2023 1417   GLUCOSE 114 (H) 10/11/2023 1417   BUN 12 10/11/2023 1417   BUN 12 03/08/2023 1104   CREATININE 1.00 10/11/2023 1417   CREATININE 0.98 01/06/2017 1617   CALCIUM  9.1 10/11/2023 1417   PROT 7.2 10/11/2023 1417   PROT 6.9  10/11/2023 1417   PROT 6.6 10/19/2022 1003   ALBUMIN 4.4 10/11/2023 1417   ALBUMIN 4.3 10/19/2022 1003   AST 18 10/11/2023 1417   ALT 16 10/11/2023 1417   ALKPHOS 96 10/11/2023 1417   BILITOT 0.4 10/11/2023 1417   BILITOT 0.5 10/19/2022 1003   GFRNONAA >60 03/29/2023 1726   GFRNONAA >89 05/22/2016 1308   GFRAA >60 09/30/2019 1937   GFRAA >89 05/22/2016 1308      Latest Ref Rng & Units 10/11/2023    2:17 PM 04/14/2023    2:56 PM 03/29/2023    5:26 PM  BMP  Glucose 70 - 99 mg/dL 885  89  90   BUN 6 - 23 mg/dL 12  12  12    Creatinine 0.40 - 1.50 mg/dL 8.99  8.56  8.94   Sodium 135 - 145 mEq/L 135  140  137   Potassium 3.5 - 5.1 mEq/L 3.9  4.3  3.7   Chloride 96 - 112 mEq/L 98  103  103   CO2 19 - 32 mEq/L 30  31  27    Calcium  8.4 - 10.5 mg/dL 9.1  9.0  9.0        Component Value Date/Time   WBC 17.5 (H) 10/11/2023 1417   RBC 5.36 10/11/2023 1417   HGB 16.1 10/11/2023 1417   HGB 16.9 03/08/2023 1104   HCT 47.9 10/11/2023 1417   HCT 53.8 (H) 03/08/2023 1104   PLT 207.0 10/11/2023 1417   PLT 209 03/08/2023 1104   MCV 89.4 10/11/2023 1417   MCV 92 03/08/2023 1104   MCH 29.9 03/29/2023  1726   MCHC 33.6 10/11/2023 1417   RDW 14.0 10/11/2023 1417   RDW 14.4 03/08/2023 1104   LYMPHSABS 2.5 10/11/2023 1417   MONOABS 1.3 (H) 10/11/2023 1417   EOSABS 0.2 10/11/2023 1417   BASOSABS 0.0 10/11/2023 1417      Parts of this note may have been dictated using voice recognition software. There may be variances in spelling and vocabulary which are unintentional. Not all errors are proofread. Please notify the dino if any discrepancies are noted or if the meaning of any statement is not clear.

## 2024-11-21 ENCOUNTER — Other Ambulatory Visit

## 2024-11-28 ENCOUNTER — Ambulatory Visit: Admitting: "Endocrinology
